# Patient Record
Sex: Male | Born: 1995 | Race: White | Hispanic: No | Marital: Single | Smoking: Never smoker
Health system: Southern US, Community
[De-identification: ages and names within clinical notes are randomized; demographics above are authoritative.]

## PROBLEM LIST (undated history)

## (undated) DIAGNOSIS — E119 Type 2 diabetes mellitus without complications: Secondary | ICD-10-CM

## (undated) DIAGNOSIS — R011 Cardiac murmur, unspecified: Secondary | ICD-10-CM

## (undated) DIAGNOSIS — T8859XA Other complications of anesthesia, initial encounter: Secondary | ICD-10-CM

## (undated) DIAGNOSIS — I10 Essential (primary) hypertension: Secondary | ICD-10-CM

## (undated) HISTORY — PX: UPPER GASTROINTESTINAL ENDOSCOPY: SHX188

## (undated) HISTORY — PX: PALATE / UVULA BIOPSY / EXCISION: SUR128

## (undated) HISTORY — DX: Type 2 diabetes mellitus without complications: E11.9

---
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
## 2017-02-11 LAB — HEMOGLOBIN A1C: HEMOGLOBIN A1C: 13

## 2017-03-01 ENCOUNTER — Ambulatory Visit (INDEPENDENT_AMBULATORY_CARE_PROVIDER_SITE_OTHER): Payer: Medicaid Other | Admitting: "Endocrinology

## 2017-03-01 ENCOUNTER — Encounter: Payer: Self-pay | Admitting: "Endocrinology

## 2017-03-01 VITALS — BP 133/93 | HR 130 | Ht 74.0 in | Wt 184.0 lb

## 2017-03-01 DIAGNOSIS — E118 Type 2 diabetes mellitus with unspecified complications: Secondary | ICD-10-CM

## 2017-03-01 DIAGNOSIS — E109 Type 1 diabetes mellitus without complications: Secondary | ICD-10-CM | POA: Insufficient documentation

## 2017-03-01 DIAGNOSIS — E1165 Type 2 diabetes mellitus with hyperglycemia: Secondary | ICD-10-CM | POA: Diagnosis not present

## 2017-03-01 DIAGNOSIS — IMO0002 Reserved for concepts with insufficient information to code with codable children: Secondary | ICD-10-CM | POA: Insufficient documentation

## 2017-03-01 DIAGNOSIS — E1143 Type 2 diabetes mellitus with diabetic autonomic (poly)neuropathy: Secondary | ICD-10-CM | POA: Insufficient documentation

## 2017-03-01 LAB — GLUCOSE, POCT (MANUAL RESULT ENTRY): POC Glucose: 423 mg/dl — AB (ref 70–99)

## 2017-03-01 MED ORDER — INSULIN PEN NEEDLE 32G X 4 MM MISC: 1.0000 | Freq: Four times a day (QID) | 3 refills | Status: DC

## 2017-03-01 MED ORDER — INSULIN GLARGINE 100 UNIT/ML SOLOSTAR PEN: 16.0000 [IU] | PEN_INJECTOR | Freq: Every day | SUBCUTANEOUS | 2 refills | Status: DC

## 2017-03-01 NOTE — Progress Notes (Signed)
Subjective:    Patient ID: [name], male    DOB: Jun 08, 1996. Patient is being seen in consultation for management of diabetes requested by  [name], FNP  Past Medical History:  Diagnosis Date  . Diabetes mellitus, type II ([place])    History reviewed. No pertinent surgical history. Social History   Social History  . Marital status: Single    Spouse name: N/A  . Number of children: N/A  . Years of education: N/A   Social History Main Topics  . Smoking status: Never Smoker  . Smokeless tobacco: Never Used  . Alcohol use No  . Drug use: No  . Sexual activity: Not Asked   Other Topics Concern  . None   Social History Narrative  . None   Outpatient Encounter Prescriptions as of 03/01/2017  Medication Sig  . Insulin Glargine (LANTUS SOLOSTAR) 100 UNIT/ML Solostar Pen Inject 16 Units into the skin daily at 10 pm.  . Insulin Pen Needle (BD PEN NEEDLE NANO U/F) 32G X 4 MM MISC 1 each by Does not apply route 4 (four) times daily.   No facility-administered encounter medications on file as of 03/01/2017.    ALLERGIES: Allergies  Allergen Reactions  . Amoxicillin    VACCINATION STATUS:  There is no immunization history on file for this patient.  Diabetes  He presents for his initial diabetic visit. He has type 2 diabetes mellitus. Onset time: He was diagnosed at approximate age of 21 years. His disease course has been worsening. There are no hypoglycemic associated symptoms. Pertinent negatives for hypoglycemia include no confusion, headaches, pallor or seizures. Associated symptoms include blurred vision, polydipsia and polyuria. Pertinent negatives for diabetes include no chest pain, no fatigue, no polyphagia and no weakness. There are no hypoglycemic complications. Symptoms are worsening. There are no diabetic complications. Risk factors for coronary artery disease include diabetes mellitus and male sex. His weight is decreasing steadily. He is following a  generally unhealthy diet. When asked about meal planning, he reported none. He has not had a previous visit with a dietitian. He rarely participates in exercise. (He came with no meter nor logs to review today. He has not taking any medications for a year. His most recent A1c was 13% on 02/11/2017. His fasting blood glucose morning was 423.) An ACE inhibitor/angiotensin II receptor blocker is not being taken. He does not see a podiatrist.Eye exam is not current.       Review of Systems  Constitutional: Positive for unexpected weight change. Negative for chills, fatigue and fever.       He lost approximately 20 pounds over the last year.  HENT: Negative for dental problem, mouth sores and trouble swallowing.   Eyes: Positive for blurred vision. Negative for visual disturbance.  Respiratory: Negative for cough, choking, chest tightness, shortness of breath and wheezing.   Cardiovascular: Negative for chest pain, palpitations and leg swelling.  Gastrointestinal: Negative for abdominal distention, abdominal pain, constipation, diarrhea, nausea and vomiting.  Endocrine: Positive for polydipsia and polyuria. Negative for polyphagia.  Genitourinary: Negative for dysuria, flank pain, hematuria and urgency.  Musculoskeletal: Negative for back pain, gait problem, myalgias and neck pain.  Skin: Negative for pallor, rash and wound.  Neurological: Negative for seizures, syncope, weakness, numbness and headaches.  Psychiatric/Behavioral: Negative.  Negative for confusion and dysphoric mood.    Objective:    BP (!) 133/93   Pulse (!) 130   Ht 6\' 2"  (9.62 m)   Wt 184  lb (83.5 kg)   BMI 23.62 kg/m   Wt Readings from Last 3 Encounters:  03/01/17 184 lb (83.5 kg) (85 %, Z= 1.05)*   * Growth percentiles are based on CDC 2-20 Years data.    Physical Exam  Constitutional: He is oriented to person, place, and time. He appears well-developed and well-nourished. He is cooperative. No distress.  HENT:   Head: Normocephalic and atraumatic.  Eyes: EOM are normal.  Neck: Normal range of motion. Neck supple. No tracheal deviation present. No thyromegaly present.  Cardiovascular: Normal rate, S1 normal, S2 normal and normal heart sounds.  Exam reveals no gallop.   No murmur heard. Pulses:      Dorsalis pedis pulses are 1+ on the right side, and 1+ on the left side.       Posterior tibial pulses are 1+ on the right side, and 1+ on the left side.  Pulmonary/Chest: Breath sounds normal. No respiratory distress. He has no wheezes.  Abdominal: Soft. Bowel sounds are normal. He exhibits no distension. There is no tenderness. There is no guarding and no CVA tenderness.  Musculoskeletal: He exhibits no edema.       Right shoulder: He exhibits no swelling and no deformity.  Neurological: He is alert and oriented to person, place, and time. He has normal strength and normal reflexes. No cranial nerve deficit or sensory deficit. Gait normal.  Skin: Skin is warm and dry. No rash noted. No cyanosis. Nails show no clubbing.  Psychiatric: His speech is normal and behavior is normal. Cognition and memory are normal.  Unconcerned affect.   Feb. 19 2018: A1c 13%. Fasting blood glucose this morning 423.   Assessment & Plan:   1. Uncontrolled type  ? diabetes mellitus with complication, unspecified long term insulin use status ([place])  - He is not very sure on the type of diabetes he has. - He has not taken any medication for more than a year, denies any history of diabetic ketoacidosis, suggesting likely type 2 diabetes.  - Patient has currently uncontrolled symptomatic type ? DM since 21  years of age ,  with most recent A1c of 13 %. Recent labs reviewed.   He is diabetes is complicated by noncompliance and patient remains at a high risk for more acute and chronic complications of diabetes which include CAD, CVA, CKD, retinopathy, and neuropathy. These are all discussed in detail with the patient.  - I have  counseled the patient on diet management and  by adopting a carbohydrate restricted/protein rich diet. - He does not have excessive weight to lose at this time, he has lost 20 pounds progressively over the last year.  - Suggestion is made for patient to avoid simple carbohydrates   from their diet including Cakes , Desserts, Ice Cream,  Soda (  diet and regular) , Sweet Tea , Candies,  Chips, Cookies, Artificial Sweeteners,   and "Sugar-free" Products . This will help patient to have stable blood glucose profile and potentially avoid unintended weight gain.  - I encouraged the patient to switch to  unprocessed or minimally processed complex starch and increased protein intake (animal or plant source), fruits, and vegetables.  - Patient is advised to stick to a routine mealtimes to eat 3 meals  a day and avoid unnecessary snacks ( to snack only to correct hypoglycemia).  - The patient will be scheduled with Jearld Fenton, RDN, CDE for individualized DM education.  - I have approached patient with the following individualized  plan to manage diabetes and patient agrees:   - Regardless of the type of diabetes he has, he will need intensive insulin treatment for now. - I approached him to engage for strict monitoring of blood glucose 4 times a day-before meals and at bedtime and return in one week with his meter and logs. - I will initiate basal insulin with Lantus 16 units daily at bedtime to start, he is likely to require nasal/bolus insulin after his commitment is assured and depending on his response to the basal insulin in one week.  -Patient is encouraged to call clinic for blood glucose levels less than 70 or above 300 mg /dl.  - He is not on any oral antidiabetic agents at this time.  - He is not a candidate for incretin, nor SGLT2 inhibitors therapy . - Patient specific target  A1c;  LDL, HDL, Triglycerides, and  Waist Circumference were discussed in detail.  2) BP/HTN: Uncontrolled.  He  is not on any medications. If blood pressure remains above target he would be considered for low-dose hydrochlorothiazide.  3) Lipids/HPL:   Lipid panel unknown.   Patient is not on statins. 4)  Weight/Diet:  He is currently appropriate weight, CDE Consult will be initiated , exercise, and detailed carbohydrates information provided.  5) Chronic Care/Health Maintenance:  -Patient is not  on ACEI/ARB and Statin medications and encouraged to continue to follow up with Ophthalmology, Podiatrist at least yearly or according to recommendations, and advised to   stay away from smoking. I have recommended yearly flu vaccine and pneumonia vaccination at least every 5 years; moderate intensity exercise for up to 150 minutes weekly; and  sleep for at least 7 hours a day.  - 60 minutes of time was spent on the care of this patient , 50% of which was applied for counseling on diabetes complications and their preventions.  - Patient to bring meter and  blood glucose logs during his next visit.   - I advised patient to maintain close follow up with PRICE, KRISTEN, FNP for primary care needs.  Follow up plan: - Return in about 1 week (around 03/08/2017) for follow up with meter and logs- no labs.  Glade Lloyd, MD Phone: [phone]  Fax: [phone]   03/01/2017, 12:47 PM

## 2017-03-14 DIAGNOSIS — M545 Low back pain, unspecified: Secondary | ICD-10-CM | POA: Insufficient documentation

## 2017-03-14 DIAGNOSIS — M542 Cervicalgia: Secondary | ICD-10-CM | POA: Insufficient documentation

## 2017-03-15 ENCOUNTER — Encounter: Payer: Self-pay | Admitting: "Endocrinology

## 2017-03-15 ENCOUNTER — Ambulatory Visit (INDEPENDENT_AMBULATORY_CARE_PROVIDER_SITE_OTHER): Payer: Medicaid Other | Admitting: "Endocrinology

## 2017-03-15 VITALS — BP 152/95 | HR 103 | Ht 74.0 in | Wt 187.0 lb

## 2017-03-15 DIAGNOSIS — E118 Type 2 diabetes mellitus with unspecified complications: Secondary | ICD-10-CM

## 2017-03-15 DIAGNOSIS — I10 Essential (primary) hypertension: Secondary | ICD-10-CM | POA: Diagnosis not present

## 2017-03-15 DIAGNOSIS — Z91199 Patient's noncompliance with other medical treatment and regimen due to unspecified reason: Secondary | ICD-10-CM | POA: Insufficient documentation

## 2017-03-15 DIAGNOSIS — E1165 Type 2 diabetes mellitus with hyperglycemia: Secondary | ICD-10-CM

## 2017-03-15 DIAGNOSIS — Z9119 Patient's noncompliance with other medical treatment and regimen: Secondary | ICD-10-CM | POA: Insufficient documentation

## 2017-03-15 LAB — GLUCOSE, POCT (MANUAL RESULT ENTRY): POC Glucose: 490 mg/dl — AB (ref 70–99)

## 2017-03-15 MED ORDER — INSULIN GLARGINE 100 UNIT/ML SOLOSTAR PEN: 30.0000 [IU] | PEN_INJECTOR | Freq: Every day | SUBCUTANEOUS | 2 refills | Status: DC

## 2017-03-15 MED ORDER — HYDROCHLOROTHIAZIDE 25 MG PO TABS: 25.0000 mg | ORAL_TABLET | Freq: Every day | ORAL | 3 refills | Status: DC

## 2017-03-15 NOTE — Progress Notes (Signed)
Subjective:    Patient ID: [name], male    DOB: [birth year]. Patient is being seen in f/u for management of diabetes requested by  [name], FNP  Past Medical History:  Diagnosis Date  . Diabetes mellitus, type II ([place])    History reviewed. No pertinent surgical history. Social History   Social History  . Marital status: Single    Spouse name: N/A  . Number of children: N/A  . Years of education: N/A   Social History Main Topics  . Smoking status: Never Smoker  . Smokeless tobacco: Never Used  . Alcohol use No  . Drug use: No  . Sexual activity: Not Asked   Other Topics Concern  . None   Social History Narrative  . None   Outpatient Encounter Prescriptions as of 03/15/2017  Medication Sig  . Insulin Glargine (LANTUS SOLOSTAR) 100 UNIT/ML Solostar Pen Inject 30 Units into the skin daily at 10 pm.  . Insulin Pen Needle (BD PEN NEEDLE NANO U/F) 32G X 4 MM MISC 1 each by Does not apply route 4 (four) times daily.  . [DISCONTINUED] Insulin Glargine (LANTUS SOLOSTAR) 100 UNIT/ML Solostar Pen Inject 16 Units into the skin daily at 10 pm.   No facility-administered encounter medications on file as of 03/15/2017.    ALLERGIES: Allergies  Allergen Reactions  . Amoxicillin    VACCINATION STATUS:  There is no immunization history on file for this patient.  Diabetes  He presents for his follow-up diabetic visit. He has type 2 diabetes mellitus. Onset time: He was diagnosed at approximate age of 21 years. His disease course has been worsening. There are no hypoglycemic associated symptoms. Pertinent negatives for hypoglycemia include no confusion, headaches, pallor or seizures. Associated symptoms include blurred vision, polydipsia and polyuria. Pertinent negatives for diabetes include no chest pain, no fatigue, no polyphagia and no weakness. There are no hypoglycemic complications. Symptoms are worsening. There are no diabetic complications. Risk factors for  coronary artery disease include diabetes mellitus and male sex. His weight is decreasing steadily. He is following a generally unhealthy diet. When asked about meal planning, he reported none. He has not had a previous visit with a dietitian. He rarely participates in exercise. His overall blood glucose range is >200 mg/dl. (He came with no meter , documentedglucose readings between 400-428 , 4 x  A day . Most of these readingsa re suspicious . His most recent A1c was 13% on 02/11/2017. Marland Kitchen) An ACE inhibitor/angiotensin II receptor blocker is not being taken. He does not see a podiatrist.Eye exam is not current.    Review of Systems  Constitutional: Positive for unexpected weight change. Negative for chills, fatigue and fever.       He lost approximately 20 pounds over the last year.  HENT: Negative for dental problem, mouth sores and trouble swallowing.   Eyes: Positive for blurred vision. Negative for visual disturbance.  Respiratory: Negative for cough, choking, chest tightness, shortness of breath and wheezing.   Cardiovascular: Negative for chest pain, palpitations and leg swelling.  Gastrointestinal: Negative for abdominal distention, abdominal pain, constipation, diarrhea, nausea and vomiting.  Endocrine: Positive for polydipsia and polyuria. Negative for polyphagia.  Genitourinary: Negative for dysuria, flank pain, hematuria and urgency.  Musculoskeletal: Negative for back pain, gait problem, myalgias and neck pain.  Skin: Negative for pallor, rash and wound.  Neurological: Negative for seizures, syncope, weakness, numbness and headaches.  Psychiatric/Behavioral: Negative.  Negative for confusion and dysphoric mood.  Objective:    BP (!) 152/95   Pulse (!) 103   Ht 6\' 2"  (1.88 m)   Wt 187 lb (84.8 kg)   BMI 24.01 kg/m   Wt Readings from Last 3 Encounters:  03/15/17 187 lb (84.8 kg) (87 %, Z= 1.13)*  03/01/17 184 lb (83.5 kg) (85 %, Z= 1.05)*   * Growth percentiles are based on  CDC 2-20 Years data.    Physical Exam  Constitutional: He is oriented to person, place, and time. He appears well-developed and well-nourished. He is cooperative. No distress.  HENT:  Head: Normocephalic and atraumatic.  Eyes: EOM are normal.  Neck: Normal range of motion. Neck supple. No tracheal deviation present. No thyromegaly present.  Cardiovascular: Normal rate, S1 normal, S2 normal and normal heart sounds.  Exam reveals no gallop.   No murmur heard. Pulses:      Dorsalis pedis pulses are 1+ on the right side, and 1+ on the left side.       Posterior tibial pulses are 1+ on the right side, and 1+ on the left side.  Pulmonary/Chest: Breath sounds normal. No respiratory distress. He has no wheezes.  Abdominal: Soft. Bowel sounds are normal. He exhibits no distension. There is no tenderness. There is no guarding and no CVA tenderness.  Musculoskeletal: He exhibits no edema.       Right shoulder: He exhibits no swelling and no deformity.  Neurological: He is alert and oriented to person, place, and time. He has normal strength and normal reflexes. No cranial nerve deficit or sensory deficit. Gait normal.  Skin: Skin is warm and dry. No rash noted. No cyanosis. Nails show no clubbing.  Psychiatric: His speech is normal and behavior is normal. Cognition and memory are normal.  Unconcerned affect.   Feb. 19 2018: A1c 13%. Fasting blood glucose this morning 423.   Assessment & Plan:   1. Uncontrolled type  ? diabetes mellitus with complication, unspecified long term insulin use status ([place])  - He is not very sure on the type of diabetes he has. - Prior to his last visit, he has not taken any medication for more than a year, denies any history of diabetic ketoacidosis, suggesting likely type 2 diabetes.  - Patient has currently uncontrolled symptomatic type ? DM since 21  years of age ,  with most recent A1c of 13 %. Recent labs reviewed.   He is diabetes is complicated by  noncompliance and patient remains at a high risk for more acute and chronic complications of diabetes which include CAD, CVA, CKD, retinopathy, and neuropathy. These are all discussed in detail with the patient.  - I have counseled the patient on diet management and  by adopting a carbohydrate restricted/protein rich diet. - He does not have excessive weight to lose at this time, he has lost 20 pounds progressively over the last year.  - Suggestion is made for patient to avoid simple carbohydrates   from their diet including Cakes , Desserts, Ice Cream,  Soda (  diet and regular) , Sweet Tea , Candies,  Chips, Cookies, Artificial Sweeteners,   and "Sugar-free" Products . This will help patient to have stable blood glucose profile and potentially avoid unintended weight gain.  - I encouraged the patient to switch to  unprocessed or minimally processed complex starch and increased protein intake (animal or plant source), fruits, and vegetables.  - Patient is advised to stick to a routine mealtimes to eat 3 meals  a  day and avoid unnecessary snacks ( to snack only to correct hypoglycemia).  - The patient will be scheduled with Jearld Fenton, RDN, CDE for individualized DM education.  - I have approached patient with the following individualized plan to manage diabetes and patient agrees:   - Fortunately patient presents with suspicious blood glucose readings without his meter. - Regardless of the type of diabetes he has, he will need intensive insulin treatment for now, however his engagement for strict monitoring for optimal use of insulin is necessary. - I approached him to engage better , start monitoring of blood glucose 4 times a day-before meals and at bedtime and return in 2 weeks with his meter and logs. - I will increase his basal insulin with Lantus to 30 units daily at bedtime to start, he is likely to require basal/bolus insulin after his commitment is assured and depending on his response  to the basal insulin in one week.  -Patient is encouraged to call clinic for blood glucose levels less than 70 or above 300 mg /dl.  - He is not on any oral antidiabetic agents at this time.  - He is not a candidate for incretin, nor SGLT2 inhibitors therapy . - Patient specific target  A1c;  LDL, HDL, Triglycerides, and  Waist Circumference were discussed in detail.  2) BP/HTN: Uncontrolled on 2 visits now.  He is not on any medications. I will prescribed hydrochlorothiazide 25 mg by mouth daily for him. 3) Lipids/HPL:   Lipid panel unknown.   Patient is not on statins. 4)  Weight/Diet:  He has currently appropriate weight, CDE Consult will be initiated , exercise, and detailed carbohydrates information provided.  5) Chronic Care/Health Maintenance:  -Patient is not  on ACEI/ARB and Statin medications and encouraged to continue to follow up with Ophthalmology, Podiatrist at least yearly or according to recommendations, and advised to   stay away from smoking. I have recommended yearly flu vaccine and pneumonia vaccination at least every 5 years; moderate intensity exercise for up to 150 minutes weekly; and  sleep for at least 7 hours a day.  - 30 minutes of time was spent on the care of this patient , 50% of which was applied for counseling on diabetes complications and their preventions.  - Patient to bring meter and  blood glucose logs during his next visit.   - I advised patient to maintain close follow up with PRICE, KRISTEN, FNP for primary care needs.  Follow up plan: - Return in about 2 weeks (around 03/29/2017) for follow up with pre-visit labs, meter, and logs.  Glade Lloyd, MD Phone: [phone]  Fax: [phone]   03/15/2017, 8:48 AM

## 2017-03-27 ENCOUNTER — Encounter: Payer: Medicaid Other | Attending: Emergency Medicine | Admitting: Nutrition

## 2017-03-27 ENCOUNTER — Other Ambulatory Visit: Payer: Self-pay | Admitting: "Endocrinology

## 2017-03-27 VITALS — Ht 74.0 in | Wt 198.0 lb

## 2017-03-27 DIAGNOSIS — E118 Type 2 diabetes mellitus with unspecified complications: Secondary | ICD-10-CM | POA: Diagnosis not present

## 2017-03-27 DIAGNOSIS — IMO0002 Reserved for concepts with insufficient information to code with codable children: Secondary | ICD-10-CM

## 2017-03-27 DIAGNOSIS — Z713 Dietary counseling and surveillance: Secondary | ICD-10-CM | POA: Diagnosis not present

## 2017-03-27 DIAGNOSIS — E108 Type 1 diabetes mellitus with unspecified complications: Secondary | ICD-10-CM

## 2017-03-27 DIAGNOSIS — Z68.41 Body mass index (BMI) pediatric, 5th percentile to less than 85th percentile for age: Secondary | ICD-10-CM | POA: Diagnosis not present

## 2017-03-27 DIAGNOSIS — E1065 Type 1 diabetes mellitus with hyperglycemia: Secondary | ICD-10-CM

## 2017-03-27 LAB — COMPREHENSIVE METABOLIC PANEL
ALBUMIN: 4.4 g/dL (ref 3.6–5.1)
ALK PHOS: 58 U/L (ref 48–230)
ALT: 12 U/L (ref 8–46)
AST: 14 U/L (ref 12–32)
BILIRUBIN TOTAL: 0.5 mg/dL (ref 0.2–1.1)
BUN: 7 mg/dL (ref 7–20)
CALCIUM: 10 mg/dL (ref 8.9–10.4)
CO2: 22 mmol/L (ref 20–31)
CREATININE: 0.66 mg/dL (ref 0.60–1.26)
Chloride: 102 mmol/L (ref 98–110)
GLUCOSE: 227 mg/dL — AB (ref 65–99)
Potassium: 5.2 mmol/L — ABNORMAL HIGH (ref 3.8–5.1)
Sodium: 138 mmol/L (ref 135–146)
Total Protein: 7.4 g/dL (ref 6.3–8.2)

## 2017-03-27 NOTE — Patient Instructions (Signed)
Goals 1. Follow My Plate Method 2. Eat 4-5 carb choices per meal Increase fresh fruit and vegetabls. 3. Keep drinking water- 1 gallon 4. Keep exercising 30 minutes per day 5. Take 30 units of Lantus at night and give in abdomen area 6. Goal is to get A1C down to 7-8%. Make sure you bing meter and BS logs with to all appointments

## 2017-03-27 NOTE — Progress Notes (Signed)
Diabetes Self-Management Education  Visit Type: First/Initial  Appt. Start Time: 1100 Appt. End Time: 1200  03/27/2017  Mr. Alycia Rossetti, identified by name and date of birth, is a 21 y.o. male with a diagnosis of Diabetes: Type 1. Lives with his Mom and Dad who are separated. Eats three meals per day. Taking Lantus daily as prescribed. Eating meals consistently. Motivated to make healthy choices and improve DM and comply with taking insulin. Leaving the office now to go get his blood work done today. Sees Dr. Dorris Fetch, Endocrinologist next week.  ASSESSMENT  Height 6\' 2"  (1.88 m), weight 198 lb (89.8 kg). Body mass index is 25.42 kg/m.      Diabetes Self-Management Education - 03/27/17 1107      Visit Information   Visit Type First/Initial     Initial Visit   Diabetes Type Type 1   Are you currently following a meal plan? No   Are you taking your medications as prescribed? Yes   Date Diagnosed 2013     Health Coping   How would you rate your overall health? Good     Psychosocial Assessment   Patient Belief/Attitude about Diabetes Motivated to manage diabetes   Self-care barriers None   Self-management support Family;Doctor's office   Other persons present Patient   Patient Concerns Nutrition/Meal planning;Medication;Healthy Lifestyle;Problem Solving;Weight Control   Preferred Learning Style No preference indicated   Learning Readiness Contemplating   How often do you need to have someone help you when you read instructions, pamphlets, or other written materials from your doctor or pharmacy? 1 - Never   What is the last grade level you completed in school? 12     Pre-Education Assessment   Patient understands the diabetes disease and treatment process. Needs Review   Patient understands incorporating nutritional management into lifestyle. Needs Review   Patient undertands incorporating physical activity into lifestyle. Needs Review   Patient understands using medications  safely. Needs Review   Patient understands monitoring blood glucose, interpreting and using results Needs Review   Patient understands prevention, detection, and treatment of acute complications. Needs Review   Patient understands prevention, detection, and treatment of chronic complications. Needs Review   Patient understands how to develop strategies to address psychosocial issues. Needs Review   Patient understands how to develop strategies to promote health/change behavior. Needs Review     Complications   Last HgB A1C per patient/outside source 13 %   How often do you check your blood sugar? 3-4 times/day   Fasting Blood glucose range (mg/dL) >200   Postprandial Blood glucose range (mg/dL) >200   Number of hypoglycemic episodes per month 0   Number of hyperglycemic episodes per week 0   Have you had a dilated eye exam in the past 12 months? No   Have you had a dental exam in the past 12 months? No   Are you checking your feet? No     Dietary Intake   Breakfast skipped due to blood work; eggs,  1 biscuits, 1 sausage, 1% milk 8 oz   Lunch 2 bologna and cheese sandwiches,    Snack (afternoon) water or crystal light   Dinner Grilled salmon, green beans, and water   Beverage(s) water, crystal light     Exercise   How many days per week to you exercise? 5   How many minutes per day do you exercise? 30   Total minutes per week of exercise 150     Patient Education  Previous Diabetes Education Yes (please comment)  [place]   Disease state  Explored patient's options for treatment of their diabetes   Nutrition management  Meal options for control of blood glucose level and chronic complications.;Meal timing in regards to the patients' current diabetes medication.;Carbohydrate counting;Food label reading, portion sizes and measuring food.;Role of diet in the treatment of diabetes and the relationship between the three main macronutrients and blood glucose level   Physical activity and  exercise  Role of exercise on diabetes management, blood pressure control and cardiac health.;Helped patient identify appropriate exercises in relation to his/her diabetes, diabetes complications and other health issue.   Medications Taught/reviewed insulin injection, site rotation, insulin storage and needle disposal.;Reviewed patients medication for diabetes, action, purpose, timing of dose and side effects.;Reviewed medication adjustment guidelines for hyperglycemia and sick days.   Monitoring Purpose and frequency of SMBG.;Taught/discussed recording of test results and interpretation of SMBG.;Interpreting lab values - A1C, lipid, urine microalbumina.;Identified appropriate SMBG and/or A1C goals.;Daily foot exams;Taught/evaluated SMBG meter.;Yearly dilated eye exam   Acute complications Discussed and identified patients' treatment of hyperglycemia.   Chronic complications Relationship between chronic complications and blood glucose control;Lipid levels, blood glucose control and heart disease;Identified and discussed with patient  current chronic complications;Retinopathy and reason for yearly dilated eye exams;Nephropathy, what it is, prevention of, the use of ACE, ARB's and early detection of through urine microalbumia.;Reviewed with patient heart disease, higher risk of, and prevention   Psychosocial adjustment Helped patient identify a support system for diabetes management;Identified and addressed patients feelings and concerns about diabetes;Brainstormed with patient on coping mechanisms for social situations, getting support from significant others, dealing with feelings about diabetes;Worked with patient to identify barriers to care and solutions   Personal strategies to promote health Review risk of smoking and offered smoking cessation     Individualized Goals (developed by patient)   Nutrition General guidelines for healthy choices and portions discussed;Adjust meds/carbs with exercise as  discussed;Follow meal plan discussed   Physical Activity Exercise 5-7 days per week;30 minutes per day   Medications take my medication as prescribed   Monitoring  send in my blood glucose log as discussed;test blood glucose pre and post meals as discussed   Reducing Risk examine blood glucose patterns;get labs drawn;do foot checks daily;treat hypoglycemia with 15 grams of carbs if blood glucose less than 70mg /dL;increase portions of nuts and seeds;increase portions of healthy fats   Health Coping ask for help with (comment)  meal planning     Post-Education Assessment   Patient understands the diabetes disease and treatment process. Demonstrates understanding / competency   Patient understands incorporating nutritional management into lifestyle. Needs Review   Patient undertands incorporating physical activity into lifestyle. Needs Review     Outcomes   Expected Outcomes Demonstrated interest in learning. Expect positive outcomes   Future DMSE 2 wks   Program Status Completed      Individualized Plan for Diabetes Self-Management Training:   Learning Objective:  Patient will have a greater understanding of diabetes self-management. Patient education plan is to attend individual and/or group sessions per assessed needs and concerns.   Plan:   Patient Instructions  Goals 1. Follow My Plate Method 2. Eat 4-5 carb choices per meal Increase fresh fruit and vegetabls. 3. Keep drinking water- 1 gallon 4. Keep exercising 30 minutes per day 5. Take 30 units of Lantus at night and give in abdomen area 6. Goal is to get A1C down to 7-8%. Make sure you bing meter  and BS logs with to all appointments    Expected Outcomes:  Demonstrated interest in learning. Expect positive outcomes  Education material provided: Living Well with Diabetes, Food label handouts, Meal plan card, My Plate and Carbohydrate counting sheet  If problems or questions, patient to contact team via:  Phone and  Email  Future DSME appointment: 2 wks

## 2017-03-29 ENCOUNTER — Ambulatory Visit: Payer: Medicaid Other | Admitting: "Endocrinology

## 2017-04-03 ENCOUNTER — Encounter: Payer: Self-pay | Admitting: "Endocrinology

## 2017-04-03 ENCOUNTER — Other Ambulatory Visit: Payer: Self-pay

## 2017-04-03 ENCOUNTER — Encounter: Payer: Medicaid Other | Admitting: Nutrition

## 2017-04-03 ENCOUNTER — Ambulatory Visit (INDEPENDENT_AMBULATORY_CARE_PROVIDER_SITE_OTHER): Payer: Medicaid Other | Admitting: "Endocrinology

## 2017-04-03 VITALS — BP 135/89 | HR 101 | Ht 74.0 in | Wt 196.0 lb

## 2017-04-03 VITALS — Ht 72.0 in | Wt 196.0 lb

## 2017-04-03 DIAGNOSIS — E1165 Type 2 diabetes mellitus with hyperglycemia: Secondary | ICD-10-CM

## 2017-04-03 DIAGNOSIS — Z794 Long term (current) use of insulin: Principal | ICD-10-CM

## 2017-04-03 DIAGNOSIS — E118 Type 2 diabetes mellitus with unspecified complications: Principal | ICD-10-CM

## 2017-04-03 DIAGNOSIS — I10 Essential (primary) hypertension: Secondary | ICD-10-CM | POA: Diagnosis not present

## 2017-04-03 DIAGNOSIS — Z9119 Patient's noncompliance with other medical treatment and regimen: Secondary | ICD-10-CM | POA: Diagnosis not present

## 2017-04-03 DIAGNOSIS — Z91199 Patient's noncompliance with other medical treatment and regimen due to unspecified reason: Secondary | ICD-10-CM

## 2017-04-03 DIAGNOSIS — IMO0002 Reserved for concepts with insufficient information to code with codable children: Secondary | ICD-10-CM

## 2017-04-03 MED ORDER — INSULIN GLARGINE 100 UNIT/ML SOLOSTAR PEN: 40.0000 [IU] | PEN_INJECTOR | Freq: Every day | SUBCUTANEOUS | 2 refills | Status: DC

## 2017-04-03 MED ORDER — HYDROCHLOROTHIAZIDE 25 MG PO TABS: 25.0000 mg | ORAL_TABLET | Freq: Every day | ORAL | 0 refills | Status: DC

## 2017-04-03 MED ORDER — GLUCOSE BLOOD VI STRP: ORAL_STRIP | 5 refills | Status: DC

## 2017-04-03 MED ORDER — ACCU-CHEK SOFTCLIX LANCET DEV MISC: 5 refills | Status: DC

## 2017-04-03 NOTE — Progress Notes (Signed)
Subjective:    Patient ID: [name], male    DOB: 22-Oct-1996. Patient is being seen in f/u for management of diabetes requested by  [name], FNP  Past Medical History:  Diagnosis Date  . Diabetes mellitus, type II ([place])    No past surgical history on file. Social History   Social History  . Marital status: Single    Spouse name: N/A  . Number of children: N/A  . Years of education: N/A   Social History Main Topics  . Smoking status: Never Smoker  . Smokeless tobacco: Never Used  . Alcohol use No  . Drug use: No  . Sexual activity: Not Asked   Other Topics Concern  . None   Social History Narrative  . None   Outpatient Encounter Prescriptions as of 04/03/2017  Medication Sig  . hydrochlorothiazide (HYDRODIURIL) 25 MG tablet Take 1 tablet (25 mg total) by mouth daily.  . Insulin Glargine (LANTUS SOLOSTAR) 100 UNIT/ML Solostar Pen Inject 40 Units into the skin daily at 10 pm.  . Insulin Pen Needle (BD PEN NEEDLE NANO U/F) 32G X 4 MM MISC 1 each by Does not apply route 4 (four) times daily.  . [DISCONTINUED] Insulin Glargine (LANTUS SOLOSTAR) 100 UNIT/ML Solostar Pen Inject 30 Units into the skin daily at 10 pm.   No facility-administered encounter medications on file as of 04/03/2017.    ALLERGIES: Allergies  Allergen Reactions  . Amoxicillin    VACCINATION STATUS:  There is no immunization history on file for this patient.  Diabetes  He presents for his follow-up diabetic visit. He has type 2 diabetes mellitus. Onset time: He was diagnosed at approximate age of 21 years. His disease course has been improving. There are no hypoglycemic associated symptoms. Pertinent negatives for hypoglycemia include no confusion, headaches, pallor or seizures. Associated symptoms include blurred vision, polydipsia and polyuria. Pertinent negatives for diabetes include no chest pain, no fatigue, no polyphagia and no weakness. There are no hypoglycemic complications.  Symptoms are improving. There are no diabetic complications. Risk factors for coronary artery disease include diabetes mellitus and male sex. His weight is increasing steadily. He is following a generally unhealthy diet. When asked about meal planning, he reported none. He has not had a previous visit with a dietitian. He rarely participates in exercise. His overall blood glucose range is >200 mg/dl. (She came with inadequate monitoring but better than before, monitored 8 times in the last 7 days averaging 250 which is improving from averaging nearly 500 during the last 2 visits.) An ACE inhibitor/angiotensin II receptor blocker is not being taken. He does not see a podiatrist.Eye exam is not current.    Review of Systems  Constitutional: Positive for unexpected weight change. Negative for chills, fatigue and fever.        He has regained 12 pounds over the last 2 weeks after he lost approximately 20 pounds over the last year.  HENT: Negative for dental problem, mouth sores and trouble swallowing.   Eyes: Positive for blurred vision. Negative for visual disturbance.  Respiratory: Negative for cough, choking, chest tightness, shortness of breath and wheezing.   Cardiovascular: Negative for chest pain, palpitations and leg swelling.  Gastrointestinal: Negative for abdominal distention, abdominal pain, constipation, diarrhea, nausea and vomiting.  Endocrine: Positive for polydipsia and polyuria. Negative for polyphagia.  Genitourinary: Negative for dysuria, flank pain, hematuria and urgency.  Musculoskeletal: Negative for back pain, gait problem, myalgias and neck pain.  Skin: Negative  for pallor, rash and wound.  Neurological: Negative for seizures, syncope, weakness, numbness and headaches.  Psychiatric/Behavioral: Negative.  Negative for confusion and dysphoric mood.    Objective:    BP 135/89   Pulse (!) 101   Ht 6\' 2"  (1.88 m)   Wt 196 lb (88.9 kg)   BMI 25.16 kg/m   Wt Readings from  Last 3 Encounters:  04/03/17 196 lb (88.9 kg) (91 %, Z= 1.36)*  03/27/17 198 lb (89.8 kg) (92 %, Z= 1.41)*  03/15/17 187 lb (84.8 kg) (87 %, Z= 1.13)*   * Growth percentiles are based on CDC 2-20 Years data.    Physical Exam  Constitutional: He is oriented to person, place, and time. He appears well-developed and well-nourished. He is cooperative. No distress.  HENT:  Head: Normocephalic and atraumatic.  Eyes: EOM are normal.  Neck: Normal range of motion. Neck supple. No tracheal deviation present. No thyromegaly present.  Cardiovascular: Normal rate, S1 normal, S2 normal and normal heart sounds.  Exam reveals no gallop.   No murmur heard. Pulses:      Dorsalis pedis pulses are 1+ on the right side, and 1+ on the left side.       Posterior tibial pulses are 1+ on the right side, and 1+ on the left side.  Pulmonary/Chest: Breath sounds normal. No respiratory distress. He has no wheezes.  Abdominal: Soft. Bowel sounds are normal. He exhibits no distension. There is no tenderness. There is no guarding and no CVA tenderness.  Musculoskeletal: He exhibits no edema.       Right shoulder: He exhibits no swelling and no deformity.  Neurological: He is alert and oriented to person, place, and time. He has normal strength and normal reflexes. No cranial nerve deficit or sensory deficit. Gait normal.  Skin: Skin is warm and dry. No rash noted. No cyanosis. Nails show no clubbing.  Psychiatric: His speech is normal and behavior is normal. Cognition and memory are normal.  Unconcerned affect.   Feb. 19 2018: A1c 13%. Fasting blood glucose this morning 423.   Assessment & Plan:   1. Uncontrolled type  ? diabetes mellitus with complication, unspecified long term insulin use status ([place])  - He is not very sure on the type of diabetes he has. - Prior to his last visit, he has not taken any medication for more than a year, denies any history of diabetic ketoacidosis, suggesting likely type 2  diabetes.  - Patient has currently uncontrolled symptomatic type ? DM since 21  years of age ,  with most recent A1c of 13 %. Recent labs reviewed.   He is diabetes is complicated by noncompliance and patient remains at a high risk for more acute and chronic complications of diabetes which include CAD, CVA, CKD, retinopathy, and neuropathy. These are all discussed in detail with the patient.  - I have counseled the patient on diet management and  by adopting a carbohydrate restricted/protein rich diet. - He does not have excessive weight to lose at this time, he has lost 20 pounds progressively over the last year.  - Suggestion is made for patient to avoid simple carbohydrates   from their diet including Cakes , Desserts, Ice Cream,  Soda (  diet and regular) , Sweet Tea , Candies,  Chips, Cookies, Artificial Sweeteners,   and "Sugar-free" Products . This will help patient to have stable blood glucose profile and potentially avoid unintended weight gain.  - I encouraged the patient to  switch to  unprocessed or minimally processed complex starch and increased protein intake (animal or plant source), fruits, and vegetables.  - Patient is advised to stick to a routine mealtimes to eat 3 meals  a day and avoid unnecessary snacks ( to snack only to correct hypoglycemia).  - The patient will be scheduled with Jearld Fenton, RDN, CDE for individualized DM education.  - I have approached patient with the following individualized plan to manage diabetes and patient agrees:   - Unfortunately, patient remains alarmingly noncompliant monitoring at random and inadequately presents with a meter showing 8 readings in the last 7 days, averaging 250 mg/dL which is a significant improvement from prior visits.  - Regardless of the type of diabetes he has, he will need intensive insulin treatment for now, however his engagement for strict monitoring for optimal use of insulin is necessary. - I re-approached him to  engage better , start monitoring of blood glucose 4 times a day-before meals and at bedtime and return in 2 weeks with his meter and logs. - I will increase his basal insulin with Lantus to 40 units daily at bedtime to start ( there is evidence that he is already benefiting from his basal insulin), he is likely to require basal/bolus insulin after his commitment is assured and depending on his response to the basal insulin in one week.  -Patient is encouraged to call clinic for blood glucose levels less than 70 or above 300 mg /dl.  - He is not on any oral antidiabetic agents at this time.  - He is not a candidate for incretin, nor SGLT2 inhibitors therapy . - Patient specific target  A1c;  LDL, HDL, Triglycerides, and  Waist Circumference were discussed in detail.  2) BP/HTN: Uncontrolled on 2 visits now.  He is not on any medications. I will prescribed hydrochlorothiazide 25 mg by mouth daily for him. 3) Lipids/HPL:   Lipid panel unknown.   Patient is not on statins. 4)  Weight/Diet:  He has currently appropriate weight, CDE Consult will be initiated , exercise, and detailed carbohydrates information provided.  5) Chronic Care/Health Maintenance:  -Patient is not  on ACEI/ARB and Statin medications and encouraged to continue to follow up with Ophthalmology, Podiatrist at least yearly or according to recommendations, and advised to   stay away from smoking. I have recommended yearly flu vaccine and pneumonia vaccination at least every 5 years; moderate intensity exercise for up to 150 minutes weekly; and  sleep for at least 7 hours a day.  - 30 minutes of time was spent on the care of this patient , 50% of which was applied for counseling on diabetes complications and their preventions.  - Patient to bring meter and  blood glucose logs during his next visit.   - I advised patient to maintain close follow up with PRICE, KRISTEN, FNP for primary care needs.  Follow up plan: - Return in about  2 weeks (around 04/17/2017) for follow up with meter and logs- no labs.  Glade Lloyd, MD Phone: [phone]  Fax: [phone]   04/03/2017, 11:22 AM

## 2017-04-03 NOTE — Patient Instructions (Addendum)
Goals 1. Drink 5 bottles of water per day 2. Don't skips meals 3. Eat meal on times. 4. Take 40 units of Lantus daily. Give insulin in stomach areas for best absorption Walk 30 minutes daily.

## 2017-04-03 NOTE — Progress Notes (Signed)
Diabetes Self-Management Education  Visit Type: Follow-up  Appt. Start Time: 1130  Appt. End Time: 6948  04/03/2017  Mr. [name], identified by name and date of birth, is a 21 y.o. male with a diagnosis of Diabetes:  . He came in today with a few blood sugar readings but not 4 times per day. He has been helping his sister move and wasn't able to eat meals on time as he was told to do. He is drinking more water. He is not eating snacks between meals. Says he didn't miss any doses of insulin. Injects in abdomen or arm. He is better engaged in better compliance with diet and medications. Making slow progress. Goes between his mom and dads houses and has 2 different meters that he records reading on.  Diet needs more low carb vegetables and increased water intake. ASSESSMENT  Height 6' (1.829 m), weight 196 lb (88.9 kg). Body mass index is 26.58 kg/m.      Diabetes Self-Management Education - 04/03/17 1139      Visit Information   Visit Type Follow-up     Pre-Education Assessment   Patient understands the diabetes disease and treatment process. Needs Review   Patient understands incorporating nutritional management into lifestyle. Needs Review   Patient undertands incorporating physical activity into lifestyle. Needs Review   Patient understands using medications safely. Needs Review   Patient understands monitoring blood glucose, interpreting and using results Needs Review   Patient understands prevention, detection, and treatment of acute complications. Needs Review   Patient understands prevention, detection, and treatment of chronic complications. Demonstrates understanding / competency   Patient understands how to develop strategies to address psychosocial issues. Demonstrates understanding / competency   Patient understands how to develop strategies to promote health/change behavior. Demonstrates understanding / competency     Complications   How often do you check your  blood sugar? 1-2 times/day   Fasting Blood glucose range (mg/dL) 180-200;>200   Postprandial Blood glucose range (mg/dL) >200   Number of hypoglycemic episodes per month 0   Number of hyperglycemic episodes per week 15     Dietary Intake   Breakfast 2 eggs, biscuit and sausage, water, milk   Lunch 2 bologna and cheese sandwiches, apple, water   Dinner broccoli and chicken casserole with rice, apple, water   Beverage(s) water     Exercise   Exercise Type Light (walking / raking leaves)   How many days per week to you exercise? 30   How many minutes per day do you exercise? 5   Total minutes per week of exercise 150     Patient Education   Nutrition management  Carbohydrate counting;Meal options for control of blood glucose level and chronic complications.;Meal timing in regards to the patients' current diabetes medication.   Physical activity and exercise  Role of exercise on diabetes management, blood pressure control and cardiac health.   Monitoring Taught/evaluated SMBG meter.;Purpose and frequency of SMBG.;Identified appropriate SMBG and/or A1C goals.   Acute complications Discussed and identified patients' treatment of hyperglycemia.     Individualized Goals (developed by patient)   Nutrition Follow meal plan discussed;General guidelines for healthy choices and portions discussed;Adjust meds/carbs with exercise as discussed   Physical Activity Exercise 5-7 days per week;45 minutes per day   Monitoring  test my blood glucose as discussed;test blood glucose pre and post meals as discussed   Reducing Risk examine blood glucose patterns;do foot checks daily     Patient Self-Evaluation of Goals -  Patient rates self as meeting previously set goals (% of time)   Nutrition 50 - 75 %   Physical Activity 50 - 75 %   Medications >75%   Monitoring < 25%   Problem Solving < 25%   Reducing Risk 25 - 50%   Health Coping 25 - 50%     Post-Education Assessment   Patient understands the  diabetes disease and treatment process. Needs Review   Patient understands incorporating nutritional management into lifestyle. Needs Review   Patient undertands incorporating physical activity into lifestyle. Needs Review   Patient understands using medications safely. Needs Review   Patient understands monitoring blood glucose, interpreting and using results Needs Review   Patient understands prevention, detection, and treatment of acute complications. Needs Review     Outcomes   Expected Outcomes Demonstrated interest in learning. Expect positive outcomes   Future DMSE 4-6 wks   Program Status Completed      Individualized Plan for Diabetes Self-Management Training:   Learning Objective:  Patient will have a greater understanding of diabetes self-management. Patient education plan is to attend individual and/or group sessions per assessed needs and concerns.   Plan:   Patient Instructions  Goals 1. Drink 5 bottles of water per day 2. Don't skips meals 3. Eat meal on times. 4. Take 40 units daily. Give insulin in stomach areas for best absorption Walk 30 minutes daily.    Expected Outcomes:  Demonstrated interest in learning. Expect positive outcomes  Education material provided: My Plate and Carbohydrate counting sheet  If problems or questions, patient to contact team via:  Phone and Email  Future DSME appointment: 4-6 wks

## 2017-04-17 ENCOUNTER — Ambulatory Visit: Payer: Medicaid Other | Admitting: "Endocrinology

## 2017-05-07 ENCOUNTER — Encounter: Payer: Self-pay | Admitting: "Endocrinology

## 2017-05-07 ENCOUNTER — Ambulatory Visit: Payer: Medicaid Other | Admitting: "Endocrinology

## 2018-08-10 ENCOUNTER — Encounter (HOSPITAL_COMMUNITY): Payer: Self-pay | Admitting: Emergency Medicine

## 2018-08-10 ENCOUNTER — Emergency Department (HOSPITAL_COMMUNITY): Payer: Medicaid Other

## 2018-08-10 ENCOUNTER — Other Ambulatory Visit: Payer: Self-pay

## 2018-08-10 ENCOUNTER — Emergency Department (HOSPITAL_COMMUNITY)
Admission: EM | Admit: 2018-08-10 | Discharge: 2018-08-10 | Disposition: A | Discharge status: Home / Self Care | Payer: Medicaid Other | Attending: Emergency Medicine | Admitting: Emergency Medicine

## 2018-08-10 DIAGNOSIS — R002 Palpitations: Secondary | ICD-10-CM | POA: Diagnosis present

## 2018-08-10 DIAGNOSIS — E119 Type 2 diabetes mellitus without complications: Secondary | ICD-10-CM | POA: Diagnosis not present

## 2018-08-10 DIAGNOSIS — I10 Essential (primary) hypertension: Secondary | ICD-10-CM | POA: Insufficient documentation

## 2018-08-10 DIAGNOSIS — E860 Dehydration: Secondary | ICD-10-CM | POA: Diagnosis not present

## 2018-08-10 DIAGNOSIS — Z794 Long term (current) use of insulin: Secondary | ICD-10-CM | POA: Insufficient documentation

## 2018-08-10 DIAGNOSIS — E1165 Type 2 diabetes mellitus with hyperglycemia: Secondary | ICD-10-CM

## 2018-08-10 HISTORY — DX: Essential (primary) hypertension: I10

## 2018-08-10 LAB — CBC WITH DIFFERENTIAL/PLATELET
Basophils Absolute: 0 10*3/uL (ref 0.0–0.1)
Basophils Relative: 0 %
Eosinophils Absolute: 0.1 10*3/uL (ref 0.0–0.7)
Eosinophils Relative: 0 %
HCT: 43.3 % (ref 39.0–52.0)
HEMOGLOBIN: 15.2 g/dL (ref 13.0–17.0)
LYMPHS ABS: 2.6 10*3/uL (ref 0.7–4.0)
LYMPHS PCT: 13 %
MCH: 29.6 pg (ref 26.0–34.0)
MCHC: 35.1 g/dL (ref 30.0–36.0)
MCV: 84.2 fL (ref 78.0–100.0)
Monocytes Absolute: 1.6 10*3/uL — ABNORMAL HIGH (ref 0.1–1.0)
Monocytes Relative: 8 %
NEUTROS PCT: 79 %
Neutro Abs: 15.6 10*3/uL — ABNORMAL HIGH (ref 1.7–7.7)
Platelets: 268 10*3/uL (ref 150–400)
RBC: 5.14 MIL/uL (ref 4.22–5.81)
RDW: 12.5 % (ref 11.5–15.5)
WBC: 19.9 10*3/uL — AB (ref 4.0–10.5)

## 2018-08-10 LAB — URINALYSIS, ROUTINE W REFLEX MICROSCOPIC
BACTERIA UA: NONE SEEN
Bilirubin Urine: NEGATIVE
Hgb urine dipstick: NEGATIVE
Ketones, ur: 5 mg/dL — AB
LEUKOCYTES UA: NEGATIVE
Nitrite: NEGATIVE
PROTEIN: NEGATIVE mg/dL
Specific Gravity, Urine: 1.035 — ABNORMAL HIGH (ref 1.005–1.030)
pH: 7 (ref 5.0–8.0)

## 2018-08-10 LAB — COMPREHENSIVE METABOLIC PANEL
ALT: 15 U/L (ref 0–44)
AST: 18 U/L (ref 15–41)
Albumin: 4.1 g/dL (ref 3.5–5.0)
Alkaline Phosphatase: 53 U/L (ref 38–126)
Anion gap: 9 (ref 5–15)
BUN: 11 mg/dL (ref 6–20)
CHLORIDE: 101 mmol/L (ref 98–111)
CO2: 23 mmol/L (ref 22–32)
Calcium: 8.9 mg/dL (ref 8.9–10.3)
Creatinine, Ser: 0.55 mg/dL — ABNORMAL LOW (ref 0.61–1.24)
Glucose, Bld: 392 mg/dL — ABNORMAL HIGH (ref 70–99)
POTASSIUM: 3.6 mmol/L (ref 3.5–5.1)
Sodium: 133 mmol/L — ABNORMAL LOW (ref 135–145)
Total Bilirubin: 0.7 mg/dL (ref 0.3–1.2)
Total Protein: 7.3 g/dL (ref 6.5–8.1)

## 2018-08-10 LAB — CBG MONITORING, ED
GLUCOSE-CAPILLARY: 311 mg/dL — AB (ref 70–99)
GLUCOSE-CAPILLARY: 285 mg/dL — AB (ref 70–99)
Glucose-Capillary: 411 mg/dL — ABNORMAL HIGH (ref 70–99)

## 2018-08-10 LAB — TSH: TSH: 0.658 u[IU]/mL (ref 0.350–4.500)

## 2018-08-10 LAB — BLOOD GAS, VENOUS
Acid-base deficit: 1.3 mmol/L (ref 0.0–2.0)
Bicarbonate: 22.7 mmol/L (ref 20.0–28.0)
O2 Saturation: 77.2 %
PO2 VEN: 43.2 mmHg (ref 32.0–45.0)
pCO2, Ven: 40.9 mmHg — ABNORMAL LOW (ref 44.0–60.0)
pH, Ven: 7.371 (ref 7.250–7.430)

## 2018-08-10 LAB — RAPID URINE DRUG SCREEN, HOSP PERFORMED
AMPHETAMINES: NOT DETECTED
Barbiturates: NOT DETECTED
Benzodiazepines: NOT DETECTED
Cocaine: NOT DETECTED
OPIATES: NOT DETECTED
Tetrahydrocannabinol: NOT DETECTED

## 2018-08-10 LAB — MAGNESIUM: MAGNESIUM: 1.7 mg/dL (ref 1.7–2.4)

## 2018-08-10 LAB — TROPONIN I: Troponin I: <0.03 ng/mL (ref <0.03)

## 2018-08-10 IMAGING — DX DG CHEST 2V
2 series · 2 of 2 positions shown · non-contrast
Comparison: None.

CLINICAL DATA: Cardiac palpitations

EXAM:
CHEST - 2 VIEW

[chest pa]
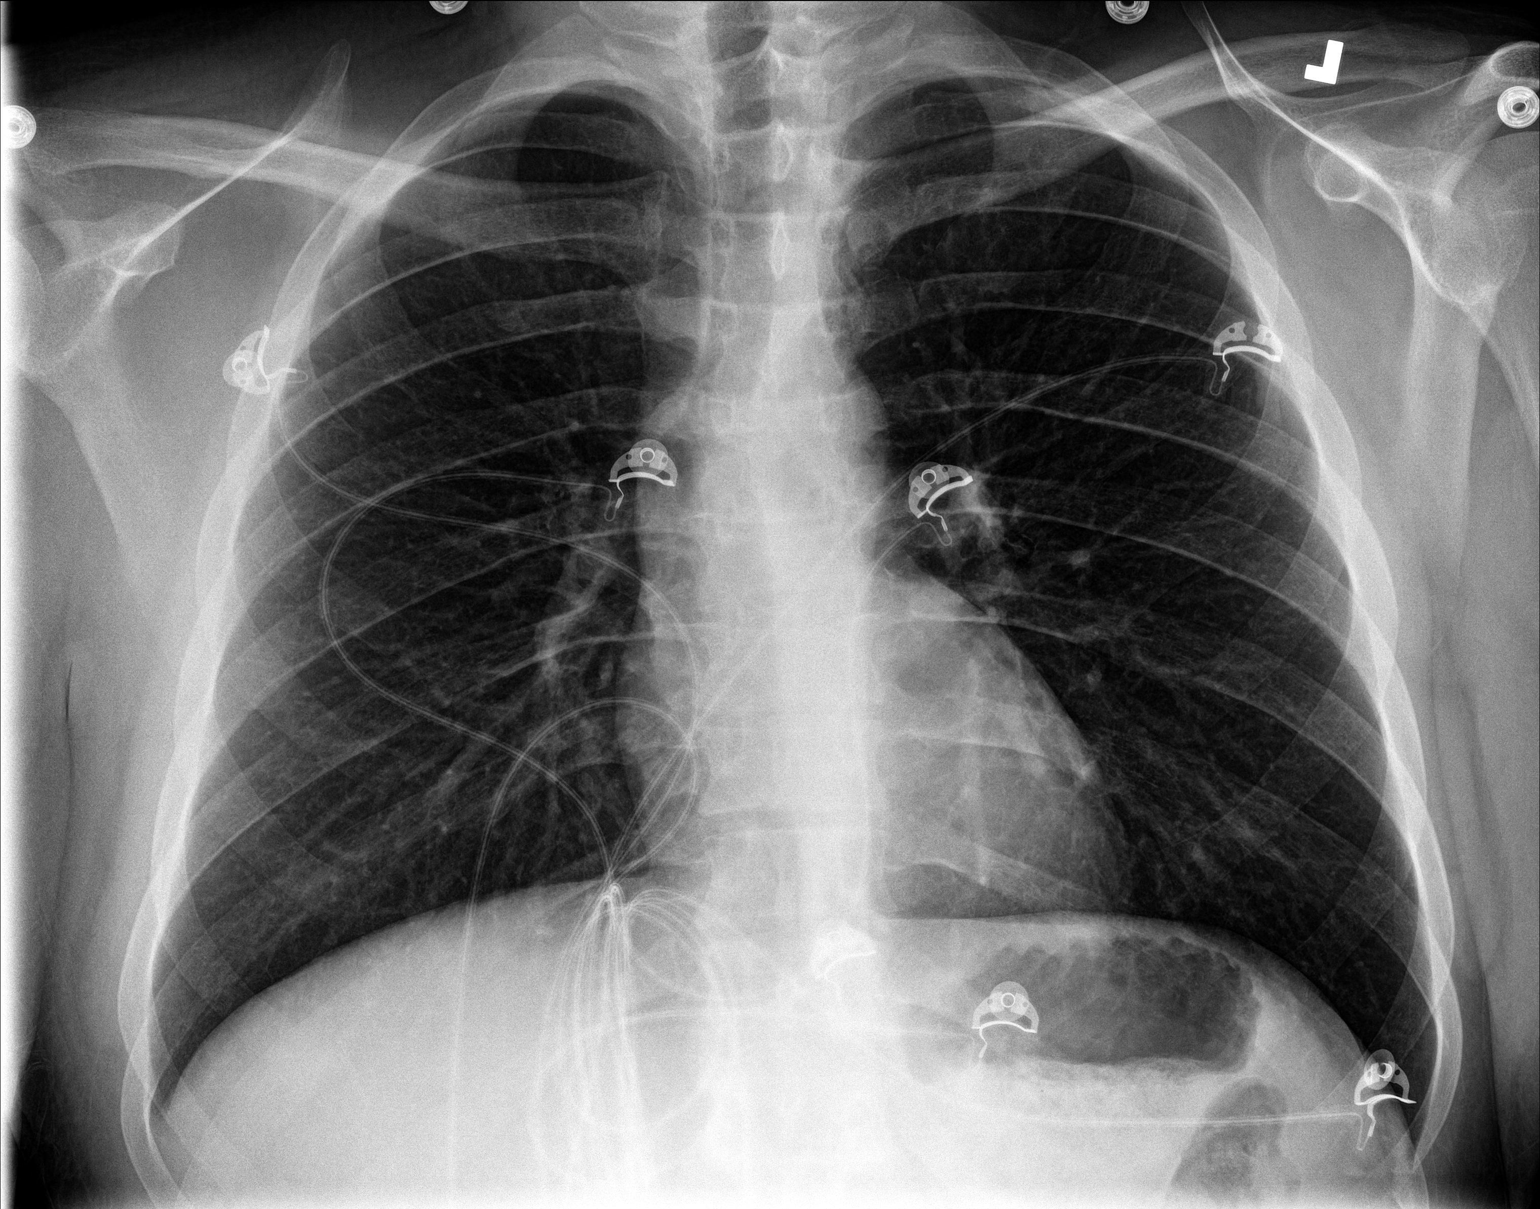

[chest lat]
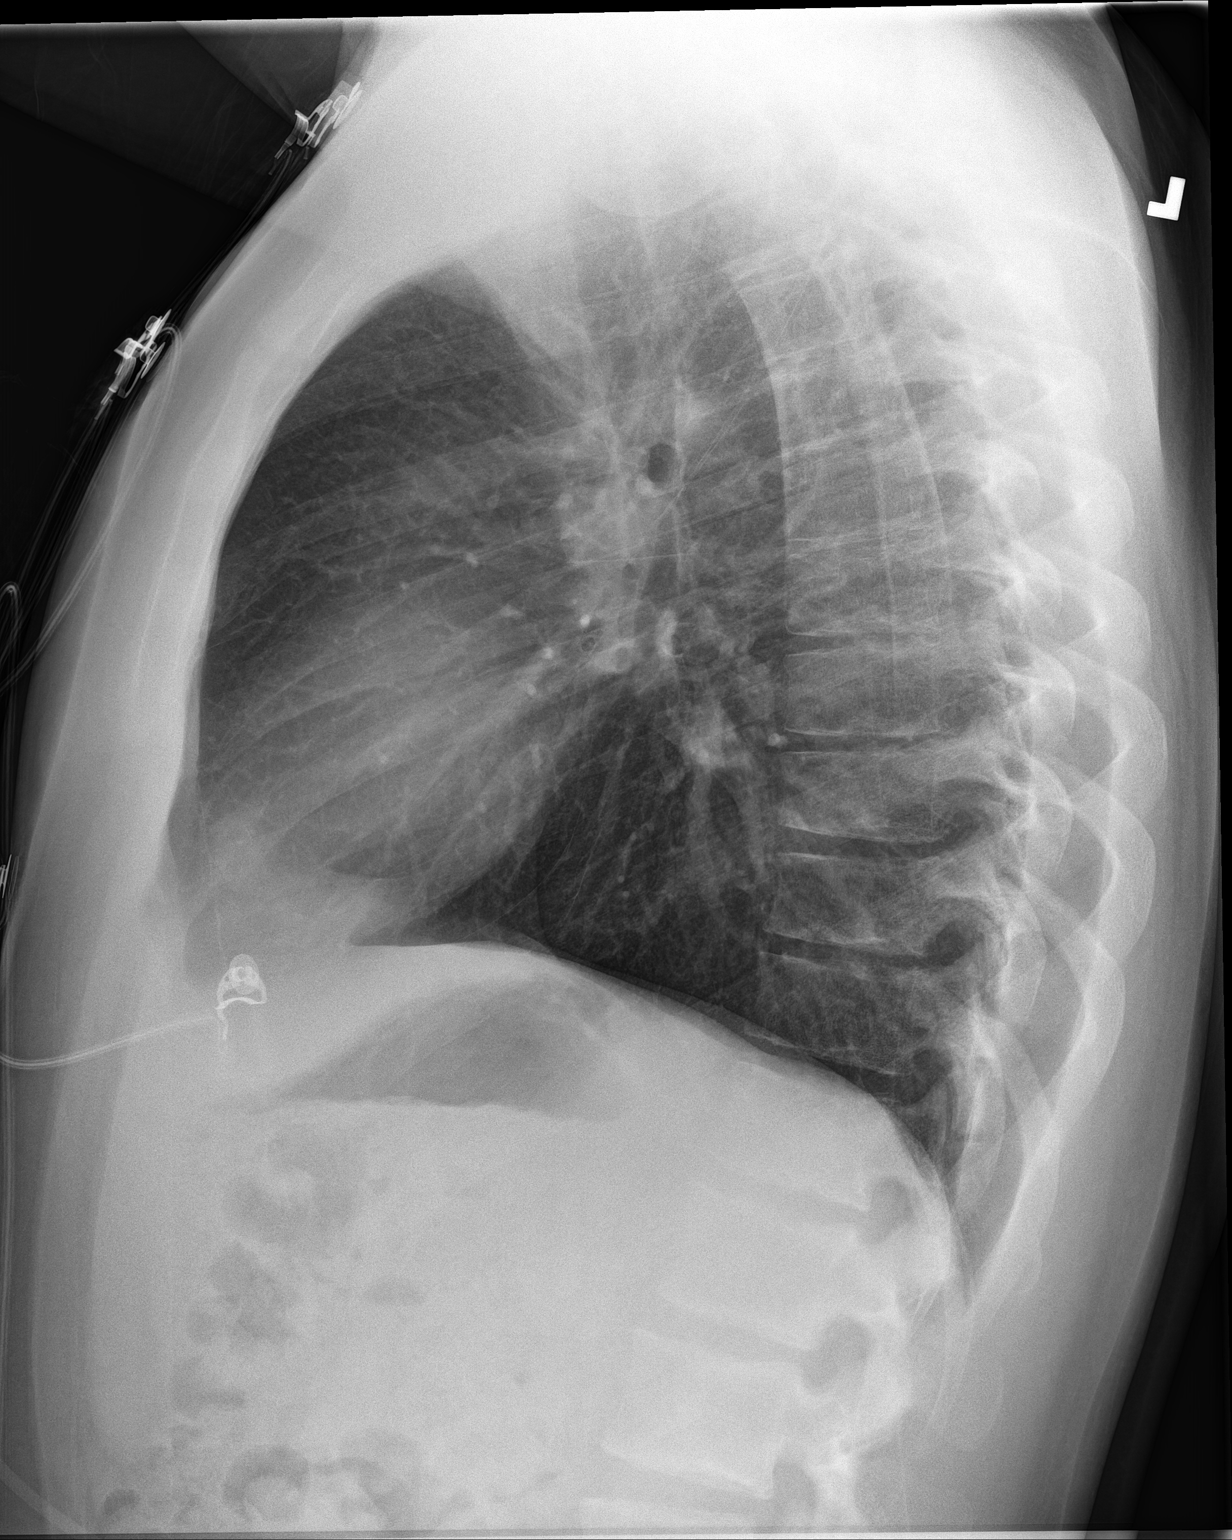

[2 of 2 positions shown; findings below may reference images not displayed]

FINDINGS: Lungs are clear. Heart size and pulmonary vascularity are normal. No
adenopathy. No pneumothorax. No evident bone lesions.
IMPRESSION: No edema or consolidation.

## 2018-08-10 MED ORDER — INSULIN PEN NEEDLE 32G X 4 MM MISC: 1.0000 | Freq: Four times a day (QID) | 3 refills | Status: DC

## 2018-08-10 MED ORDER — HYDROCHLOROTHIAZIDE 25 MG PO TABS: 25.0000 mg | ORAL_TABLET | Freq: Every day | ORAL | 0 refills | Status: DC

## 2018-08-10 MED ORDER — ACCU-CHEK SOFT TOUCH LANCETS MISC: 12 refills | Status: DC

## 2018-08-10 MED ORDER — INSULIN ASPART 100 UNIT/ML ~~LOC~~ SOLN
10.0000 [IU] | Freq: Once | SUBCUTANEOUS | Status: AC
  Administered 2018-08-10: 10 [IU] via INTRAVENOUS
  Filled 2018-08-10: qty 1

## 2018-08-10 MED ORDER — SODIUM CHLORIDE 0.9 % IV BOLUS
1000.0000 mL | Freq: Once | INTRAVENOUS | Status: AC
  Administered 2018-08-10: 1000 mL via INTRAVENOUS

## 2018-08-10 MED ORDER — INSULIN GLARGINE 100 UNIT/ML SOLOSTAR PEN: 40.0000 [IU] | PEN_INJECTOR | Freq: Every day | SUBCUTANEOUS | 2 refills | Status: DC

## 2018-08-10 MED ORDER — GLUCOSE BLOOD VI STRP: ORAL_STRIP | 5 refills | Status: DC

## 2018-08-10 MED ORDER — INSULIN ASPART 100 UNIT/ML ~~LOC~~ SOLN
10.0000 [IU] | Freq: Once | SUBCUTANEOUS | Status: AC
  Administered 2018-08-10: 10 [IU] via INTRAVENOUS

## 2018-08-10 NOTE — ED Provider Notes (Addendum)
[facility] EMERGENCY DEPARTMENT Provider Note   CSN: [id number] Arrival date & time: 08/10/18  1352     History   Chief Complaint Chief Complaint  Patient presents with  . Palpitations    HPI [name] is a 22 y.o. male.  Pt presents to the ED today with palpitations.  He said he woke up today not feeling well.  He has a hx of type 2 DM, but has not been taking his medications.  He did not feel like they worked.  He last took any insulin in January.  The pt also has a hx of hypertension, but does not take his bp meds either.  The pt does not check his blood sugar regularly, but did check it this morning, but does not know what it was.  He thinks it was around 300.  He denies any cp.  No sob.     Past Medical History:  Diagnosis Date  . Diabetes mellitus, type II ([place])   . Heart murmur   . Hypertension     Patient Active Problem List   Diagnosis Date Noted  . Personal history of noncompliance with medical treatment, presenting hazards to health 03/15/2017  . Essential hypertension, benign 03/15/2017  . Uncontrolled type 2 diabetes mellitus with complication ([place]) 62/22/9798    Past Surgical History:  Procedure Laterality Date  . 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 Authorizing Provider  glucose blood (ACCU-CHEK AVIVA) test strip Use as instructed 4 x daily. E11.65 08/10/18   Isla Pence, MD  hydrochlorothiazide (HYDRODIURIL) 25 MG tablet Take 1 tablet (25 mg total) by mouth daily. 08/10/18   Isla Pence, MD  Insulin Glargine (LANTUS SOLOSTAR) 100 UNIT/ML Solostar Pen Inject 50 Units into the skin at bedtime. 08/27/18   Cassandria Anger, MD  Insulin Pen Needle (BD PEN NEEDLE NANO U/F) 32G X 4 MM MISC 1 each by Does not apply route 4 (four) times daily. 08/10/18   Isla Pence, MD  Lancet Devices [facility]) lancets Use  as instructed 4 x daily. e11.65 Patient not taking: Reported on 08/26/2018 04/03/17   Cassandria Anger, MD  Lancets (ACCU-CHEK SOFT TOUCH) lancets Use as instructed 08/10/18   Isla Pence, MD  metFORMIN (GLUCOPHAGE) 500 MG tablet Take by mouth 2 (two) times daily with a meal.    [provider]    Family History Family History  Problem Relation Age of Onset  . Diabetes Maternal Grandmother   . Thyroid disease Maternal Grandmother   . Hypertension Maternal Grandmother     Social History Social History   Tobacco Use  . Smoking status: Passive Smoke Exposure - Never Smoker  . Smokeless tobacco: Never Used  Substance Use Topics  . Alcohol use: No  . Drug use: No     Allergies   Amoxicillin   Review of Systems Review of Systems  Cardiovascular: Positive for palpitations.  Neurological: Positive for weakness.  All other systems reviewed and are negative.    Physical Exam Updated Vital Signs BP 132/88   Pulse (!) 117   Temp 98 F (36.7 C) (Oral) Comment: Simultaneous filing. User may not have seen previous data. Comment (Src): Simultaneous filing. User may not have seen previous data.  Resp (!) 24   Ht 6\' 2"  (1.88 m)   Wt 83.9  kg   SpO2 100%   BMI 23.75 kg/m   Physical Exam  Constitutional: He is oriented to person, place, and time. He appears well-developed and well-nourished.  HENT:  Head: Normocephalic and atraumatic.  Right Ear: External ear normal.  Left Ear: External ear normal.  Nose: Nose normal.  Mouth/Throat: Mucous membranes are dry.  Eyes: Pupils are equal, round, and reactive to light. Conjunctivae and EOM are normal.  Neck: Normal range of motion. Neck supple.  Cardiovascular: Regular rhythm, normal heart sounds and intact distal pulses. Tachycardia present.  Pulmonary/Chest: Effort normal and breath sounds normal.  Abdominal: Soft. Bowel sounds are normal.  Musculoskeletal: Normal range of motion.  Neurological: He is alert and  oriented to person, place, and time.  Skin: Skin is warm. Capillary refill takes less than 2 seconds.  Psychiatric: He has a normal mood and affect. His behavior is normal. Judgment and thought content normal.  Nursing note and vitals reviewed.    ED Treatments / Results  Labs (all labs ordered are listed, but only abnormal results are displayed) Labs Reviewed  COMPREHENSIVE METABOLIC PANEL - Abnormal; Notable for the following components:      Result Value   Sodium 133 (*)    Glucose, Bld 392 (*)    Creatinine, Ser 0.55 (*)    All other components within normal limits  CBC WITH DIFFERENTIAL/PLATELET - Abnormal; Notable for the following components:   WBC 19.9 (*)    Neutro Abs 15.6 (*)    Monocytes Absolute 1.6 (*)    All other components within normal limits  URINALYSIS, ROUTINE W REFLEX MICROSCOPIC - Abnormal; Notable for the following components:   Color, Urine STRAW (*)    Specific Gravity, Urine 1.035 (*)    Glucose, UA >=500 (*)    Ketones, ur 5 (*)    All other components within normal limits  BLOOD GAS, VENOUS - Abnormal; Notable for the following components:   pCO2, Ven 40.9 (*)    All other components within normal limits  CBG MONITORING, ED - Abnormal; Notable for the following components:   Glucose-Capillary 411 (*)    All other components within normal limits  CBG MONITORING, ED - Abnormal; Notable for the following components:   Glucose-Capillary 311 (*)    All other components within normal limits  CBG MONITORING, ED - Abnormal; Notable for the following components:   Glucose-Capillary 285 (*)    All other components within normal limits  TROPONIN I  TSH  MAGNESIUM  RAPID URINE DRUG SCREEN, HOSP PERFORMED  CBG MONITORING, ED    EKG EKG Interpretation  Date/Time:  Sunday August 10 2018 13:55:13 EDT Ventricular Rate:  123 PR Interval:    QRS Duration: 91 QT Interval:  295 QTC Calculation: 422 R Axis:   11 Text Interpretation:  Sinus tachycardia  Abnormal Q suggests anterior infarct No old tracing to compare Confirmed by Isla Pence [phone]) on 08/10/2018 2:10:10 PM   Radiology No results found.  Procedures Procedures (including critical care time)  Medications Ordered in ED Medications  sodium chloride 0.9 % bolus 1,000 mL (0 mLs Intravenous Stopped 08/10/18 1516)  insulin aspart (novoLOG) injection 10 Units (10 Units Intravenous Given 08/10/18 1517)  sodium chloride 0.9 % bolus 1,000 mL (0 mLs Intravenous Stopped 08/10/18 1659)  insulin aspart (novoLOG) injection 10 Units (10 Units Intravenous Given 08/10/18 1633)  sodium chloride 0.9 % bolus 1,000 mL (0 mLs Intravenous Stopped 08/10/18 1831)     Initial Impression / Assessment and  Plan / ED Course  I have reviewed the triage vital signs and the nursing notes.  Pertinent labs & imaging results that were available during my care of the patient were reviewed by me and considered in my medical decision making (see chart for details).    CRITICAL CARE Performed by: Isla Pence   Total critical care time: 30 minutes  Critical care time was exclusive of separately billable procedures and treating other patients.  Critical care was necessary to treat or prevent imminent or life-threatening deterioration.  Critical care was time spent personally by me on the following activities: development of treatment plan with patient and/or surrogate as well as nursing, discussions with consultants, evaluation of patient's response to treatment, examination of patient, obtaining history from patient or surrogate, ordering and performing treatments and interventions, ordering and review of laboratory studies, ordering and review of radiographic studies, pulse oximetry and re-evaluation of patient's condition. HR has improved. With IVFs.  BS has improved with IV insulin. Pt encouraged to take his meds and to check his blood sugars QID.  He is given refills on all his meds.  F/u with his  endocrinologist and pcp.  Return if worse.  Final Clinical Impressions(s) / ED Diagnoses   Final diagnoses:  Dehydration  Poorly controlled type 2 diabetes mellitus [facility])    ED Discharge Orders         Ordered    glucose blood (ACCU-CHEK AVIVA) test strip     08/10/18 1537    hydrochlorothiazide (HYDRODIURIL) 25 MG tablet  Daily     08/10/18 1537    Insulin Glargine (LANTUS SOLOSTAR) 100 UNIT/ML Solostar Pen  Daily at 10 pm,   Status:  Discontinued     08/10/18 1537    Insulin Pen Needle (BD PEN NEEDLE NANO U/F) 32G X 4 MM MISC  4 times daily     08/10/18 1537    Lancets (ACCU-CHEK SOFT TOUCH) lancets     08/10/18 1537           Isla Pence, MD 08/10/18 Johnnye Lana    Isla Pence, MD 08/30/18 [phone]

## 2018-08-10 NOTE — ED Triage Notes (Signed)
Pt reports feeling of palpitations earlier today.  EMS arrived to find HR 150-160.  Could not determine if ST or SVT so no meds given.  Pt denies any symptoms currently.  Is diabetic and is not taking medications for it.

## 2018-08-10 NOTE — ED Notes (Signed)
Phlebotomy at bedside.

## 2018-08-10 NOTE — Discharge Instructions (Signed)
Check your blood sugars 4 times a day (before meals and at bedtime).  Write them down and share this with Dr. Dorris Fetch.

## 2018-08-10 NOTE — ED Notes (Signed)
EDP at bedside updating patient and family. 

## 2018-08-10 NOTE — ED Notes (Signed)
Lab notified of venous blood gas.

## 2018-08-12 ENCOUNTER — Other Ambulatory Visit: Payer: Self-pay | Admitting: "Endocrinology

## 2018-08-12 DIAGNOSIS — E1165 Type 2 diabetes mellitus with hyperglycemia: Secondary | ICD-10-CM

## 2018-08-14 LAB — BASIC METABOLIC PANEL
BUN: 17 (ref 4–21)
CREATININE: 0.7 (ref 0.6–1.3)

## 2018-08-14 LAB — TSH: TSH: 0.41 (ref 0.41–5.90)

## 2018-08-14 LAB — HEMOGLOBIN A1C: Hemoglobin A1C: 12.5

## 2018-08-18 ENCOUNTER — Other Ambulatory Visit (HOSPITAL_BASED_OUTPATIENT_CLINIC_OR_DEPARTMENT_OTHER): Payer: Self-pay

## 2018-08-18 DIAGNOSIS — G4730 Sleep apnea, unspecified: Secondary | ICD-10-CM

## 2018-08-18 DIAGNOSIS — R0683 Snoring: Secondary | ICD-10-CM

## 2018-08-26 ENCOUNTER — Encounter (HOSPITAL_COMMUNITY): Payer: Self-pay | Admitting: *Deleted

## 2018-08-26 ENCOUNTER — Other Ambulatory Visit: Payer: Self-pay

## 2018-08-26 ENCOUNTER — Emergency Department (HOSPITAL_COMMUNITY)
Admission: EM | Admit: 2018-08-26 | Discharge: 2018-08-26 | Disposition: A | Discharge status: Home / Self Care | Payer: Medicaid Other | Attending: Emergency Medicine | Admitting: Emergency Medicine

## 2018-08-26 ENCOUNTER — Emergency Department (HOSPITAL_COMMUNITY): Payer: Medicaid Other

## 2018-08-26 DIAGNOSIS — R110 Nausea: Secondary | ICD-10-CM | POA: Insufficient documentation

## 2018-08-26 DIAGNOSIS — I10 Essential (primary) hypertension: Secondary | ICD-10-CM | POA: Insufficient documentation

## 2018-08-26 DIAGNOSIS — Z794 Long term (current) use of insulin: Secondary | ICD-10-CM | POA: Diagnosis not present

## 2018-08-26 DIAGNOSIS — Z7722 Contact with and (suspected) exposure to environmental tobacco smoke (acute) (chronic): Secondary | ICD-10-CM | POA: Insufficient documentation

## 2018-08-26 DIAGNOSIS — R0602 Shortness of breath: Secondary | ICD-10-CM | POA: Diagnosis not present

## 2018-08-26 DIAGNOSIS — E0865 Diabetes mellitus due to underlying condition with hyperglycemia: Secondary | ICD-10-CM

## 2018-08-26 DIAGNOSIS — R0789 Other chest pain: Secondary | ICD-10-CM | POA: Insufficient documentation

## 2018-08-26 DIAGNOSIS — Z79899 Other long term (current) drug therapy: Secondary | ICD-10-CM | POA: Diagnosis not present

## 2018-08-26 DIAGNOSIS — R002 Palpitations: Secondary | ICD-10-CM

## 2018-08-26 DIAGNOSIS — E1165 Type 2 diabetes mellitus with hyperglycemia: Secondary | ICD-10-CM | POA: Insufficient documentation

## 2018-08-26 DIAGNOSIS — R000 Tachycardia, unspecified: Secondary | ICD-10-CM

## 2018-08-26 HISTORY — DX: Cardiac murmur, unspecified: R01.1

## 2018-08-26 LAB — TROPONIN I: Troponin I: <0.03 ng/mL (ref <0.03)

## 2018-08-26 LAB — CBC WITH DIFFERENTIAL/PLATELET
BASOS ABS: 0 10*3/uL (ref 0.0–0.1)
Basophils Relative: 0 %
EOS PCT: 0 %
Eosinophils Absolute: 0 10*3/uL (ref 0.0–0.7)
HEMATOCRIT: 45.5 % (ref 39.0–52.0)
Hemoglobin: 15.7 g/dL (ref 13.0–17.0)
LYMPHS PCT: 11 %
Lymphs Abs: 1.4 10*3/uL (ref 0.7–4.0)
MCH: 28.9 pg (ref 26.0–34.0)
MCHC: 34.5 g/dL (ref 30.0–36.0)
MCV: 83.6 fL (ref 78.0–100.0)
Monocytes Absolute: 1 10*3/uL (ref 0.1–1.0)
Monocytes Relative: 8 %
NEUTROS ABS: 10.8 10*3/uL — AB (ref 1.7–7.7)
NEUTROS PCT: 81 %
PLATELETS: 258 10*3/uL (ref 150–400)
RBC: 5.44 MIL/uL (ref 4.22–5.81)
RDW: 12.7 % (ref 11.5–15.5)
WBC: 13.2 10*3/uL — AB (ref 4.0–10.5)

## 2018-08-26 LAB — COMPREHENSIVE METABOLIC PANEL
ALT: 35 U/L (ref 0–44)
ANION GAP: 9 (ref 5–15)
AST: 30 U/L (ref 15–41)
Albumin: 4.7 g/dL (ref 3.5–5.0)
Alkaline Phosphatase: 54 U/L (ref 38–126)
BILIRUBIN TOTAL: 1.2 mg/dL (ref 0.3–1.2)
BUN: 12 mg/dL (ref 6–20)
CO2: 26 mmol/L (ref 22–32)
Calcium: 9.8 mg/dL (ref 8.9–10.3)
Chloride: 100 mmol/L (ref 98–111)
Creatinine, Ser: 0.66 mg/dL (ref 0.61–1.24)
Glucose, Bld: 288 mg/dL — ABNORMAL HIGH (ref 70–99)
POTASSIUM: 3.5 mmol/L (ref 3.5–5.1)
Sodium: 135 mmol/L (ref 135–145)
TOTAL PROTEIN: 8.6 g/dL — AB (ref 6.5–8.1)

## 2018-08-26 LAB — MAGNESIUM: Magnesium: 1.9 mg/dL (ref 1.7–2.4)

## 2018-08-26 LAB — RAPID URINE DRUG SCREEN, HOSP PERFORMED
Amphetamines: NOT DETECTED
Barbiturates: NOT DETECTED
Benzodiazepines: NOT DETECTED
COCAINE: NOT DETECTED
OPIATES: NOT DETECTED
TETRAHYDROCANNABINOL: NOT DETECTED

## 2018-08-26 LAB — CBG MONITORING, ED: Glucose-Capillary: 260 mg/dL — ABNORMAL HIGH (ref 70–99)

## 2018-08-26 LAB — D-DIMER, QUANTITATIVE: D-Dimer, Quant: <0.27 ug/mL-FEU (ref 0.00–0.50)

## 2018-08-26 LAB — TSH: TSH: 0.517 u[IU]/mL (ref 0.350–4.500)

## 2018-08-26 IMAGING — DX DG CHEST 2V
2 series · 2 of 2 positions shown · non-contrast
Comparison: [DATE]

CLINICAL DATA: Shortness of breath

EXAM:
CHEST - 2 VIEW

[chest pa]
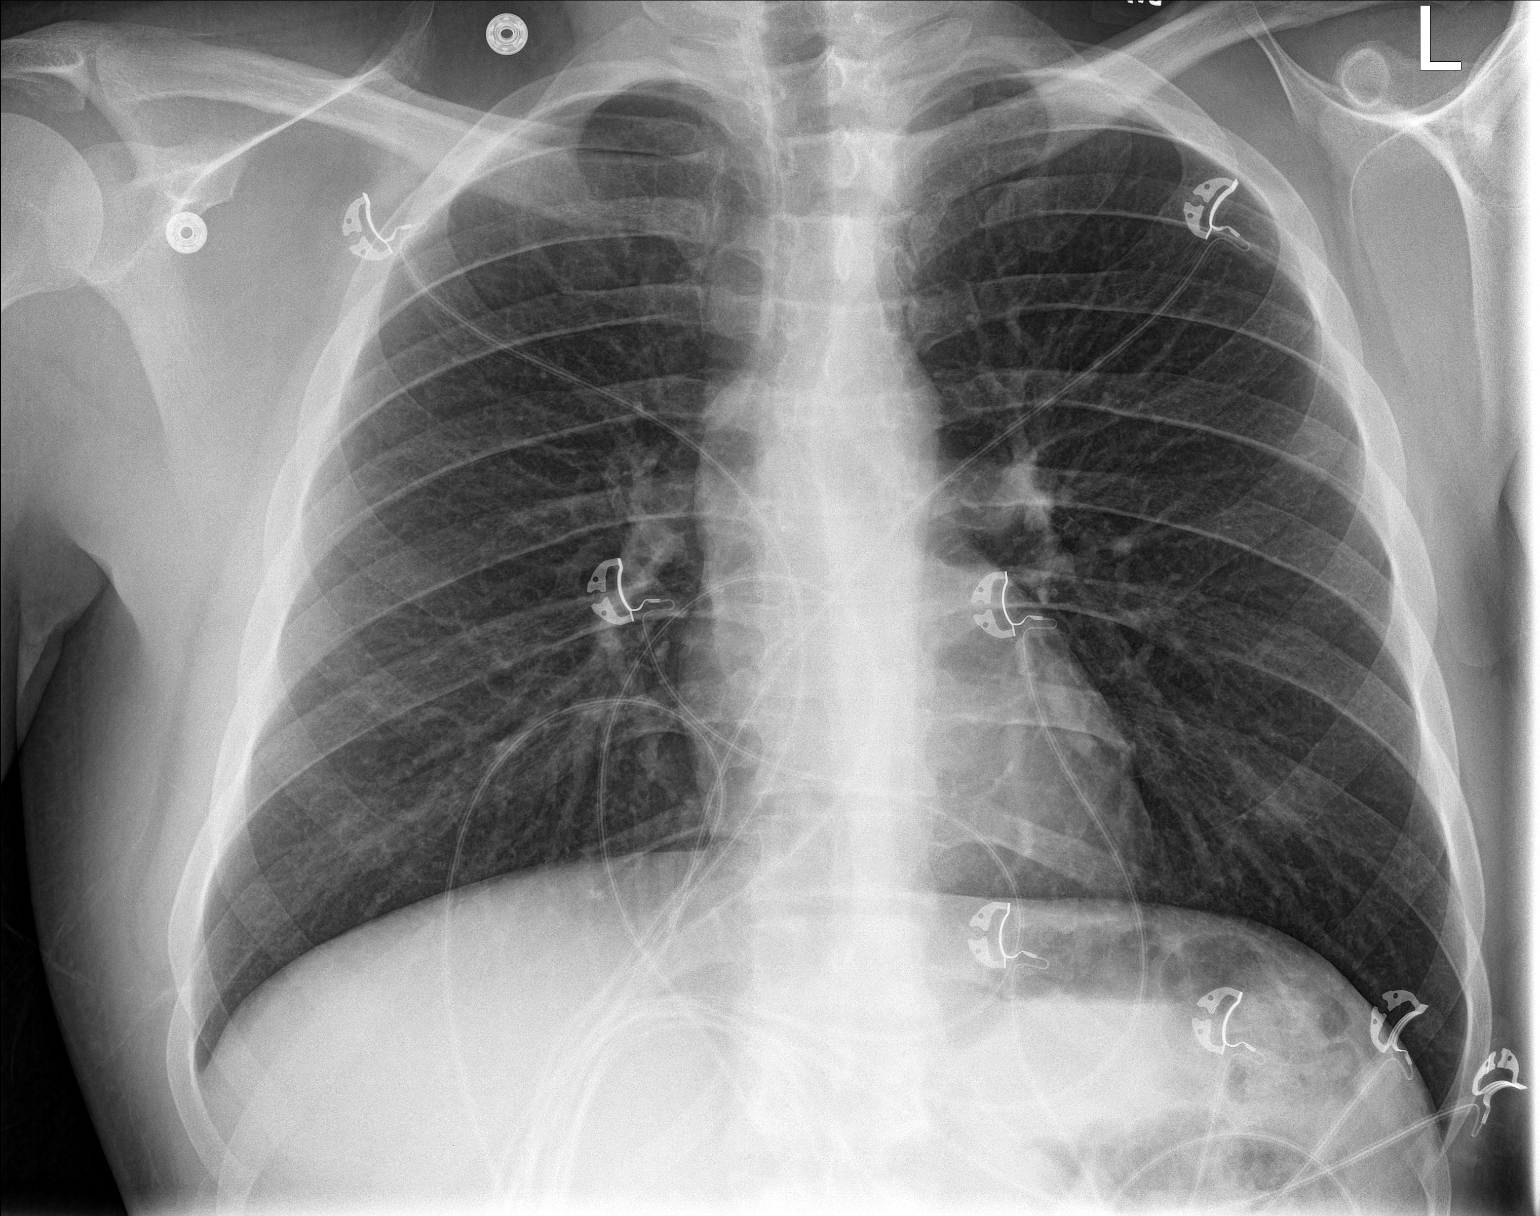

[chest lat]
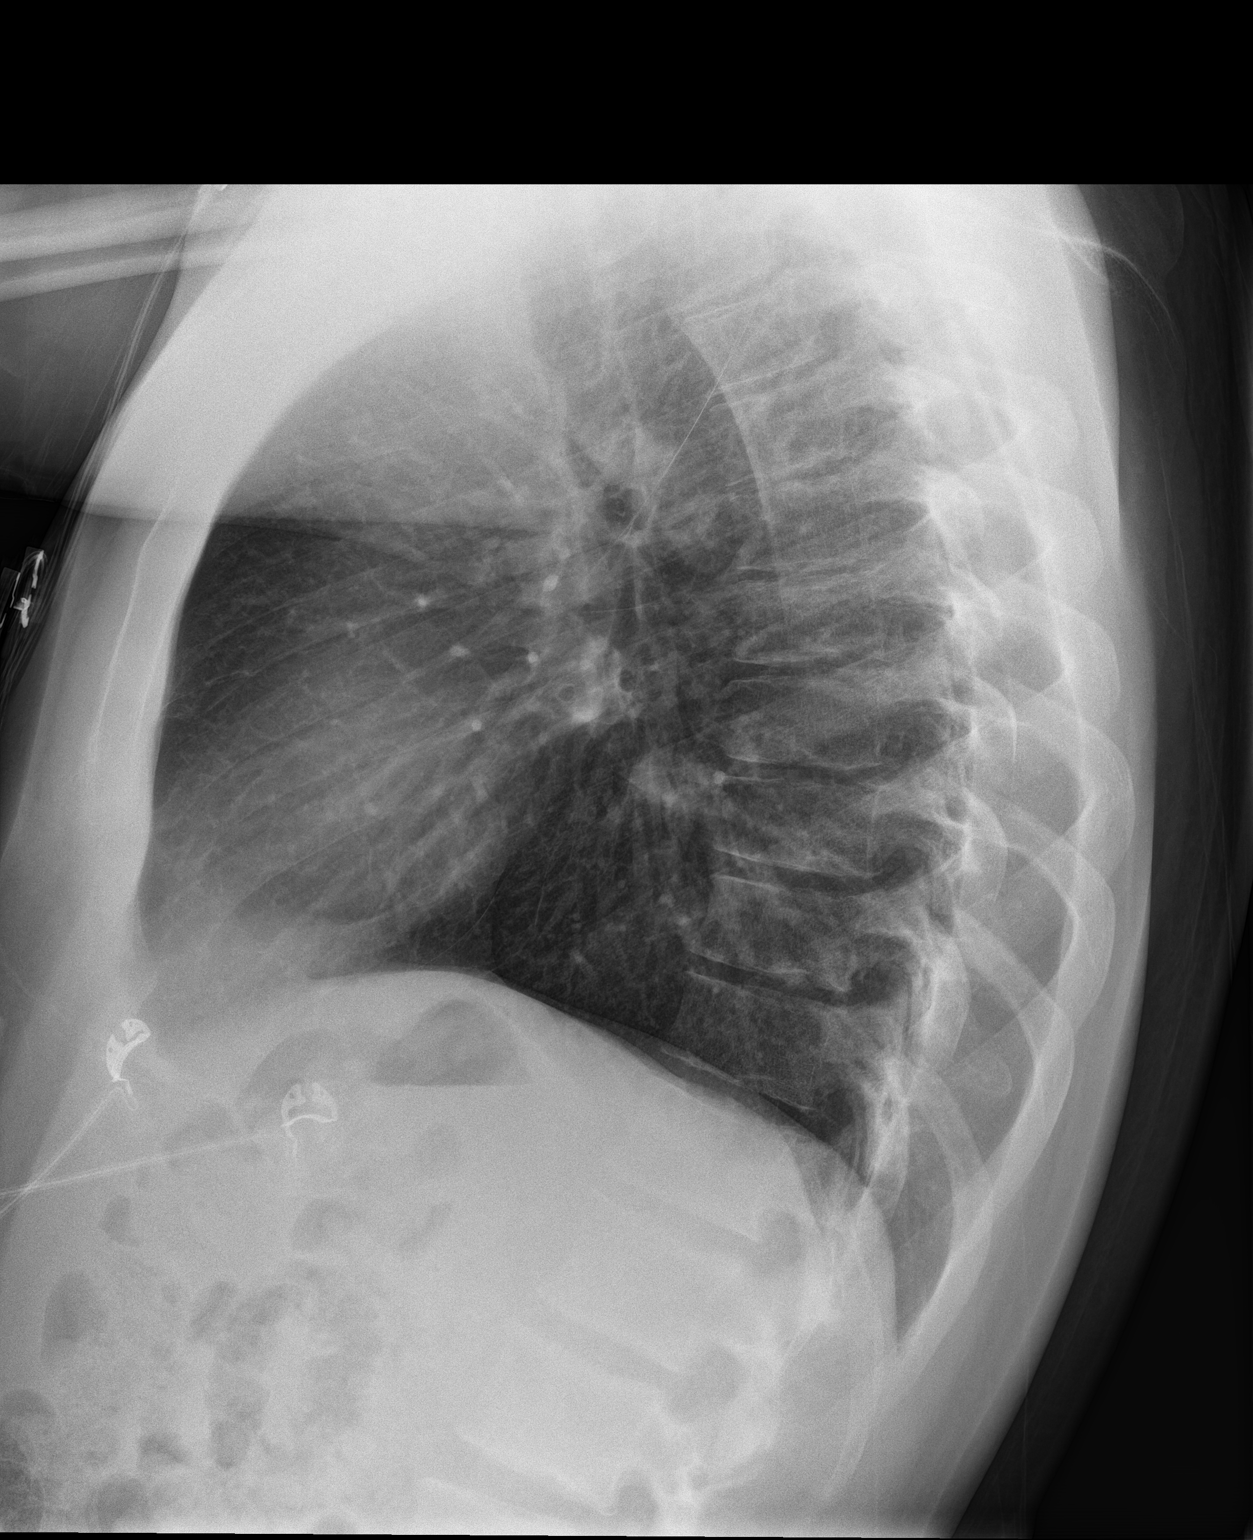

[2 of 2 positions shown; findings below may reference images not displayed]

FINDINGS: The heart size and mediastinal contours are within normal limits.
Both lungs are clear. The visualized skeletal structures are
unremarkable.
IMPRESSION: No active cardiopulmonary disease.

## 2018-08-26 MED ORDER — LORAZEPAM 2 MG/ML IJ SOLN
0.5000 mg | Freq: Once | INTRAMUSCULAR | Status: AC
  Administered 2018-08-26: 0.5 mg via INTRAVENOUS
  Filled 2018-08-26: qty 1

## 2018-08-26 MED ORDER — INSULIN ASPART 100 UNIT/ML IV SOLN
8.0000 [IU] | Freq: Once | INTRAVENOUS | Status: AC
  Administered 2018-08-26: 5 [IU] via INTRAVENOUS

## 2018-08-26 MED ORDER — ACETAMINOPHEN 500 MG PO TABS
1000.0000 mg | ORAL_TABLET | Freq: Once | ORAL | Status: AC
  Administered 2018-08-26: 1000 mg via ORAL
  Filled 2018-08-26: qty 2

## 2018-08-26 MED ORDER — SODIUM CHLORIDE 0.9 % IV BOLUS
1000.0000 mL | Freq: Once | INTRAVENOUS | Status: AC
  Administered 2018-08-26: 1000 mL via INTRAVENOUS

## 2018-08-26 NOTE — ED Notes (Signed)
Pt taken to xray 

## 2018-08-26 NOTE — Discharge Instructions (Addendum)
Stay well hydrated. Call the Cardiologist listed for an appointment and outpatient ECHO.

## 2018-08-26 NOTE — ED Provider Notes (Signed)
Blood pressure 129/83, pulse (!) 116, temperature 98.7 F (37.1 C), temperature source Oral, resp. rate 16, height 6\' 2"  (1.88 m), weight 83.9 kg, SpO2 100 %.  Assuming care from Dr. Reather Converse.  In short, [name] is a 22 y.o. male with a chief complaint of Shortness of Breath .  Refer to the original H&P for additional details.  The current plan of care is to f/u on HR and reassess.  HR improved. Patient feeling well. Will discharge with plan for outpatient f/u for ECHO.   At this time, I do not feel there is any life-threatening condition present. I have reviewed and discussed all results (EKG, imaging, lab, urine as appropriate), exam findings with patient. I have reviewed nursing notes and appropriate previous records.  I feel the patient is safe to be discharged home without further emergent workup. Discussed usual and customary return precautions. Patient and family (if present) verbalize understanding and are comfortable with this plan.  Patient will follow-up with their primary care provider. If they do not have a primary care provider, information for follow-up has been provided to them. All questions have been answered.  Nanda Quinton, MD     Margette Fast, MD 08/26/18 2028

## 2018-08-26 NOTE — ED Triage Notes (Signed)
Pt brought in by [facility] with c/o SOB, "fidgety", "unable to get my thoughts together" that started today about 1040. CBG 256 for EMS. BP 134/85, HR 130 for EMS. EMS reports they were called out to pt's household and then pt refused to come to ED. After EMS left they were called back 10 minutes later and then pt wanted to come to ED. EMS reports pt was sitting on the front porch using his neighbor's O2 tank.

## 2018-08-26 NOTE — ED Provider Notes (Signed)
[facility] EMERGENCY DEPARTMENT Provider Note   CSN: [id number] Arrival date & time: 08/26/18  1240     History   Chief Complaint Chief Complaint  Patient presents with  . Shortness of Breath    HPI [name] is a 22 y.o. male.  Patient presents to the ER with multiple different symptoms.  EMS was called out for shortness of breath, feeling shaky and fidgety that started earlier today.  Patient has had this once in the past was seen and evaluated and improved.  Patient denies any known cardiac history, no significant family history, patient has had heart murmur since he was younger.  Patient denies illegal drugs.  Patient mild anterior bilateral chest ache sensation.  No specific pleuritic symptoms.  Patient denies blood clot risk factors.  No fevers or chills recently.  Patient feels generally shaky his whole body.     Past Medical History:  Diagnosis Date  . Diabetes mellitus, type II ([place])   . Heart murmur   . Hypertension     Patient Active Problem List   Diagnosis Date Noted  . Personal history of noncompliance with medical treatment, presenting hazards to health 03/15/2017  . Essential hypertension, benign 03/15/2017  . Uncontrolled type 2 diabetes mellitus with complication ([place]) 37/16/9678    Past Surgical History:  Procedure Laterality Date  . 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 Authorizing Provider  hydrochlorothiazide (HYDRODIURIL) 25 MG tablet Take 1 tablet (25 mg total) by mouth daily. 08/10/18  Yes Isla Pence, MD  Insulin Glargine (LANTUS SOLOSTAR) 100 UNIT/ML Solostar Pen Inject 40 Units into the skin daily at 10 pm. Patient taking differently: Inject 42 Units into the skin daily at 10 pm.  08/10/18  Yes Isla Pence, MD  metFORMIN (GLUCOPHAGE) 500 MG tablet Take by mouth 2 (two) times daily with a meal.   Yes  [provider]  glucose blood (ACCU-CHEK AVIVA) test strip Use as instructed 4 x daily. E11.65 08/10/18   Isla Pence, MD  Insulin Pen Needle (BD PEN NEEDLE NANO U/F) 32G X 4 MM MISC 1 each by Does not apply route 4 (four) times daily. 08/10/18   Isla Pence, MD  Lancet Devices [facility]) lancets Use as instructed 4 x daily. e11.65 Patient not taking: Reported on 08/26/2018 04/03/17   Cassandria Anger, MD  Lancets (ACCU-CHEK SOFT [facility]) lancets Use as instructed 08/10/18   Isla Pence, MD    Family History Family History  Problem Relation Age of Onset  . Diabetes Maternal Grandmother   . Thyroid disease Maternal Grandmother   . Hypertension Maternal Grandmother     Social History Social History   Tobacco Use  . Smoking status: Passive Smoke Exposure - Never Smoker  . Smokeless tobacco: Never Used  Substance Use Topics  . Alcohol use: No  . Drug use: No     Allergies   Amoxicillin   Review of Systems Review of Systems  Constitutional: Positive for appetite change. Negative for chills and fever.  HENT: Negative for congestion.   Eyes: Negative for visual disturbance.  Respiratory: Positive for shortness of breath.   Cardiovascular: Positive for chest pain.  Gastrointestinal: Positive for nausea. Negative for abdominal pain and vomiting.  Genitourinary: Negative for dysuria and flank pain.  Musculoskeletal: Negative for back pain, neck pain and  neck stiffness.  Skin: Negative for rash.  Neurological: Positive for tremors. Negative for light-headedness and headaches.     Physical Exam Updated Vital Signs BP 136/87   Pulse (!) 121   Temp 98.7 F (37.1 C) (Oral)   Resp 10   Ht 6\' 2"  (1.88 m)   Wt 83.9 kg   SpO2 100%   BMI 23.75 kg/m   Physical Exam  Constitutional: He is oriented to person, place, and time. He appears well-developed and well-nourished.  HENT:  Head: Normocephalic and atraumatic.  Dry mm  Eyes: Conjunctivae are  normal. Right eye exhibits no discharge. Left eye exhibits no discharge.  Neck: Normal range of motion. Neck supple. No tracheal deviation present.  Cardiovascular: Regular rhythm. Tachycardia present.  Pulmonary/Chest: Effort normal and breath sounds normal.  Abdominal: Soft. He exhibits no distension. There is no tenderness. There is no guarding.  Musculoskeletal: He exhibits no edema.  Neurological: He is alert and oriented to person, place, and time. He has normal strength. No cranial nerve deficit. GCS eye subscore is 4. GCS verbal subscore is 5. GCS motor subscore is 6.  Mild shaky bilateral arms  Skin: Skin is warm. No rash noted.  Psychiatric: His mood appears anxious.  Nursing note and vitals reviewed.    ED Treatments / Results  Labs (all labs ordered are listed, but only abnormal results are displayed) Labs Reviewed  COMPREHENSIVE METABOLIC PANEL - Abnormal; Notable for the following components:      Result Value   Glucose, Bld 288 (*)    Total Protein 8.6 (*)    All other components within normal limits  CBC WITH DIFFERENTIAL/PLATELET - Abnormal; Notable for the following components:   WBC 13.2 (*)    Neutro Abs 10.8 (*)    All other components within normal limits  TSH  MAGNESIUM  D-DIMER, QUANTITATIVE (NOT AT [facility])  TROPONIN I  RAPID URINE DRUG SCREEN, HOSP PERFORMED    EKG EKG Interpretation  Date/Time:  Tuesday August 26 2018 13:03:34 EDT Ventricular Rate:  113 PR Interval:    QRS Duration: 94 QT Interval:  309 QTC Calculation: 424 R Axis:   32 Text Interpretation:  Sinus tachycardia Consider right atrial enlargement Borderline low voltage, extremity leads Confirmed by Elnora Morrison [id number]) on 08/26/2018 3:48:03 PM   Radiology Dg Chest 2 View  Result Date: 08/26/2018 CLINICAL DATA:  Shortness of breath EXAM: CHEST - 2 VIEW COMPARISON:  08/10/2017 FINDINGS: The heart size and mediastinal contours are within normal limits. Both lungs are clear. The  visualized skeletal structures are unremarkable. IMPRESSION: No active cardiopulmonary disease. Electronically Signed   By: Donavan Foil M.D.   On: 08/26/2018 14:39    Procedures Procedures (including critical care time)   EMERGENCY DEPARTMENT [place] CARDIAC EXAM "Study: Limited Ultrasound of the Heart and Pericardium"  INDICATIONS:Abnormal vital signs Multiple views of the heart and pericardium were obtained in real-time with a multi-frequency probe.  PERFORMED UR:[facility] IMAGES ARCHIVED?: Yes LIMITATIONS:  None VIEWS USED: Subcostal 4 chamber, Parasternal long axis, Parasternal short axis, Apical 4 chamber  and Inferior Vena Cava INTERPRETATION: Cardiac activity present, Pericardial effusioin absent, Normal contractility and IVC normal   Medications Ordered in ED Medications  sodium chloride 0.9 % bolus 1,000 mL (has no administration in time range)  insulin aspart (novoLOG) injection 8 Units (has no administration in time range)  sodium chloride 0.9 % bolus 1,000 mL (0 mLs Intravenous Stopped 08/26/18 1612)  acetaminophen (TYLENOL) tablet 1,000 mg (1,000 mg Oral  Given 08/26/18 1418)  LORazepam (ATIVAN) injection 0.5 mg (0.5 mg Intravenous Given 08/26/18 1520)     Initial Impression / Assessment and Plan / ED Course  I have reviewed the triage vital signs and the nursing notes.  Pertinent labs & imaging results that were available during my care of the patient were reviewed by me and considered in my medical decision making (see chart for details).   Patient presents with shortness of breath, tremors, palpitations.  Broad work-up included general blood work checking electrolytes, hemoglobin, d-dimer as patient is low risk for blood clots and everything unremarkable except for glucose which is elevated and non specific leukocytosis. Pt has no fever or significant cough. CXR clear.  Patient has history of noncompliance and uncontrolled diabetes.  Clinically concern for dehydration as well as  possible anxiety.  Patient's heart rate still 115 on recheck. Bedside ultrasound no cardiac effusion seen. Patient given small dose of Ativan to see if it would help.  Plan for repeat bolus of fluid, insulin and reassessment.  If heart rate continues to trend towards 100 patient stable for outpatient follow-up with cardiology.  If worsens plan for reassessment and possible admission for formal echo.  Signed out to Dr. Laverta Baltimore.  Final Clinical Impressions(s) / ED Diagnoses   Final diagnoses:  Sinus tachycardia  Diabetes mellitus due to underlying condition with hyperglycemia, with long-term current use of insulin ([place])  Palpitations  Shortness of breath    ED Discharge Orders    None       Elnora Morrison, MD 08/26/18 [phone]

## 2018-08-27 ENCOUNTER — Encounter: Payer: Self-pay | Admitting: "Endocrinology

## 2018-08-27 ENCOUNTER — Other Ambulatory Visit: Payer: Self-pay | Admitting: "Endocrinology

## 2018-08-27 ENCOUNTER — Ambulatory Visit (INDEPENDENT_AMBULATORY_CARE_PROVIDER_SITE_OTHER): Payer: Medicaid Other | Admitting: "Endocrinology

## 2018-08-27 VITALS — BP 135/86 | HR 90 | Ht 74.0 in | Wt 189.0 lb

## 2018-08-27 DIAGNOSIS — E118 Type 2 diabetes mellitus with unspecified complications: Secondary | ICD-10-CM

## 2018-08-27 DIAGNOSIS — Z9119 Patient's noncompliance with other medical treatment and regimen: Secondary | ICD-10-CM | POA: Diagnosis not present

## 2018-08-27 DIAGNOSIS — IMO0002 Reserved for concepts with insufficient information to code with codable children: Secondary | ICD-10-CM

## 2018-08-27 DIAGNOSIS — E1165 Type 2 diabetes mellitus with hyperglycemia: Secondary | ICD-10-CM

## 2018-08-27 DIAGNOSIS — Z91199 Patient's noncompliance with other medical treatment and regimen due to unspecified reason: Secondary | ICD-10-CM

## 2018-08-27 DIAGNOSIS — E1065 Type 1 diabetes mellitus with hyperglycemia: Secondary | ICD-10-CM

## 2018-08-27 MED ORDER — INSULIN GLARGINE 100 UNIT/ML SOLOSTAR PEN: 50.0000 [IU] | PEN_INJECTOR | Freq: Every day | SUBCUTANEOUS | 2 refills | Status: DC

## 2018-08-27 NOTE — Patient Instructions (Signed)

## 2018-08-27 NOTE — Progress Notes (Signed)
Subjective:    Patient ID: [name], male    DOB: November 06, 1996. Patient is being seen in f/u for management of diabetes requested by  Lucas Anchors, FNP  Past Medical History:  Diagnosis Date  . Diabetes mellitus, type II ([place])   . Heart murmur   . Hypertension    Past Surgical History:  Procedure Laterality Date  . PALATE / UVULA BIOPSY / EXCISION     "growth removed"   Social History   Socioeconomic History  . Marital status: Single    Spouse name: Not on file  . Number of children: Not on file  . Years of education: Not on file  . Highest education level: Not on file  Occupational History  . Not on file  Social Needs  . Financial resource strain: Not on file  . Food insecurity:    Worry: Not on file    Inability: Not on file  . Transportation needs:    Medical: Not on file    Non-medical: Not on file  Tobacco Use  . Smoking status: Passive Smoke Exposure - Never Smoker  . Smokeless tobacco: Never Used  Substance and Sexual Activity  . Alcohol use: No  . Drug use: No  . Sexual activity: Not on file  Lifestyle  . Physical activity:    Days per week: Not on file    Minutes per session: Not on file  . Stress: Not on file  Relationships  . Social connections:    Talks on phone: Not on file    Gets together: Not on file    Attends religious service: Not on file    Active member of club or organization: Not on file    Attends meetings of clubs or organizations: Not on file    Relationship status: Not on file  Other Topics Concern  . Not on file  Social History Narrative  . Not on file   Outpatient Encounter Medications as of 08/27/2018  Medication Sig  . glucose blood (ACCU-CHEK AVIVA) test strip Use as instructed 4 x daily. E11.65  . hydrochlorothiazide (HYDRODIURIL) 25 MG tablet Take 1 tablet (25 mg total) by mouth daily.  . Insulin Glargine (LANTUS SOLOSTAR) 100 UNIT/ML Solostar Pen Inject 50 Units into the skin at bedtime.  . Insulin Pen  Needle (BD PEN NEEDLE NANO U/F) 32G X 4 MM MISC 1 each by Does not apply route 4 (four) times daily.  Elmore Guise Devices (ACCU-CHEK SOFTCLIX) lancets Use as instructed 4 x daily. e11.65 (Patient not taking: Reported on 08/26/2018)  . Lancets (ACCU-CHEK SOFT TOUCH) lancets Use as instructed  . metFORMIN (GLUCOPHAGE) 500 MG tablet Take by mouth 2 (two) times daily with a meal.  . [DISCONTINUED] Insulin Glargine (LANTUS SOLOSTAR) 100 UNIT/ML Solostar Pen Inject 40 Units into the skin daily at 10 pm. (Patient taking differently: Inject 42 Units into the skin daily at 10 pm. )   No facility-administered encounter medications on file as of 08/27/2018.    ALLERGIES: Allergies  Allergen Reactions  . Amoxicillin Hives    Has patient had a PCN reaction causing immediate rash, facial/tongue/throat swelling, SOB or lightheadedness with hypotension: No Has patient had a PCN reaction causing severe rash involving mucus membranes or skin necrosis: No Has patient had a PCN reaction that required hospitalization: No Has patient had a PCN reaction occurring within the last 10 years: No If all of the above answers are "NO", then may proceed with Cephalosporin use.  VACCINATION STATUS:  There is no immunization history on file for this patient.  Diabetes  He presents for his follow-up diabetic visit. He has type 2 diabetes mellitus. Onset time: He was diagnosed at approximate age of 71 years. His disease course has been worsening. There are no hypoglycemic associated symptoms. Pertinent negatives for hypoglycemia include no confusion, headaches, pallor or seizures. Associated symptoms include blurred vision, polydipsia and polyuria. Pertinent negatives for diabetes include no chest pain, no fatigue, no polyphagia and no weakness. There are no hypoglycemic complications. Symptoms are worsening. There are no diabetic complications. (Noncompliance/nonadherence.) Risk factors for coronary artery disease include diabetes  mellitus, male sex and sedentary lifestyle. His weight is fluctuating minimally. He is following a generally unhealthy diet. When asked about meal planning, he reported none. He has not had a previous visit with a dietitian. He rarely participates in exercise. His overall blood glucose range is 180-200 mg/dl. (He missed his appointment since February 2018.  More recently he had visited ER on more than one occasion complaining of shortness of breath, and was advised to resume follow-up with me.  His recent A1c is 12.5% largely unchanged from 13% A1c during his last visit.  He admits that he has restarted his insulin after the ER visit.  His average blood glucose over the last 14 days is 199. ) An ACE inhibitor/angiotensin II receptor blocker is not being taken. He does not see a podiatrist.Eye exam is not current.    Review of Systems  Constitutional: Positive for unexpected weight change. Negative for chills, fatigue and fever.        He has regained 12 pounds over the last 2 weeks after he lost approximately 20 pounds over the last year.  HENT: Negative for dental problem, mouth sores and trouble swallowing.   Eyes: Positive for blurred vision. Negative for visual disturbance.  Respiratory: Negative for cough, choking, chest tightness, shortness of breath and wheezing.   Cardiovascular: Negative for chest pain, palpitations and leg swelling.  Gastrointestinal: Negative for abdominal distention, abdominal pain, constipation, diarrhea, nausea and vomiting.  Endocrine: Positive for polydipsia and polyuria. Negative for polyphagia.  Genitourinary: Negative for dysuria, flank pain, hematuria and urgency.  Musculoskeletal: Negative for back pain, gait problem, myalgias and neck pain.  Skin: Negative for pallor, rash and wound.  Neurological: Negative for seizures, syncope, weakness, numbness and headaches.  Psychiatric/Behavioral: Negative.  Negative for confusion and dysphoric mood.    Objective:     BP 135/86   Pulse 90   Ht 6\' 2"  (1.88 m)   Wt 189 lb (85.7 kg)   BMI 24.27 kg/m   Wt Readings from Last 3 Encounters:  08/27/18 189 lb (85.7 kg)  08/26/18 184 lb 15.5 oz (83.9 kg)  08/10/18 185 lb (83.9 kg)    Physical Exam  Constitutional: He is oriented to person, place, and time. He appears well-developed and well-nourished. He is cooperative. No distress.  HENT:  Head: Normocephalic and atraumatic.  Eyes: EOM are normal.  Neck: Normal range of motion. Neck supple. No tracheal deviation present. No thyromegaly present.  Cardiovascular: Normal rate, S1 normal, S2 normal and normal heart sounds. Exam reveals no gallop.  No murmur heard. Pulses:      Dorsalis pedis pulses are 1+ on the right side, and 1+ on the left side.       Posterior tibial pulses are 1+ on the right side, and 1+ on the left side.  Pulmonary/Chest: Breath sounds normal. No respiratory distress. He  has no wheezes.  Abdominal: Soft. Bowel sounds are normal. He exhibits no distension. There is no tenderness. There is no guarding and no CVA tenderness.  Musculoskeletal: He exhibits no edema.       Right shoulder: He exhibits no swelling and no deformity.  Neurological: He is alert and oriented to person, place, and time. He has normal strength and normal reflexes. No cranial nerve deficit or sensory deficit. Gait normal.  Skin: Skin is warm and dry. No rash noted. No cyanosis. Nails show no clubbing.  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  Psychiatric: He has a normal mood and affect. His speech is normal. Cognition and memory are normal.  Unconcerned affect, reluctant affect.   CMP Latest Ref Rng & Units 08/26/2018 08/14/2018 08/10/2018  Glucose 70 - 99 mg/dL 288(H) - 392(H)  BUN 6 - 20 mg/dL 12 17 11   Creatinine 0.61 - 1.24 mg/dL 0.66 0.7 0.55(L)  Sodium 135 - 145 mmol/L 135 - 133(L)  Potassium 3.5 - 5.1 mmol/L 3.5 - 3.6  Chloride 98 - 111 mmol/L 100 - 101  CO2 22 - 32  mmol/L 26 - 23  Calcium 8.9 - 10.3 mg/dL 9.8 - 8.9  Total Protein 6.5 - 8.1 g/dL 8.6(H) - 7.3  Total Bilirubin 0.3 - 1.2 mg/dL 1.2 - 0.7  Alkaline Phos 38 - 126 U/L 54 - 53  AST 15 - 41 U/L 30 - 18  ALT 0 - 44 U/L 35 - 15   August 14, 2018 A1c 12.5%.  Assessment & Plan:   1. Uncontrolled type  ? diabetes mellitus with complication, unspecified long term insulin use status ([place]) -This patient's care is complicated by noncompliance/nonadherence.  He no showed since February 2018.  In the interim, he was initiated on metformin 500 mg p.o. twice daily by another provider.  He admits that he has not taken his basal insulin until he was advised to do so during his ER visit for shortness of breath several weeks ago. - He is not very sure on the type of diabetes he has, denies any history of diabetic ketoacidosis, suggesting likely type 2 diabetes.  - Patient has currently uncontrolled symptomatic type ? DM since 22  years of age ,  with most recent A1c of 12.5% unchanged from 13 %, during his last visit.  Recent labs reviewed.   He still does not report any gross complications from his diabetes, however he remains at extremely high risk for  more acute and chronic complications of diabetes which include CAD, CVA, CKD, retinopathy, and neuropathy. These are all discussed in detail with the patient.  - I have counseled the patient on diet management and  by adopting a carbohydrate restricted/protein rich diet, the presence of his mother in the room. - He does not have excessive weight to lose at this time. -  Suggestion is made for him to avoid simple carbohydrates  from his diet including Cakes, Sweet Desserts / Pastries, Ice Cream, Soda (diet and regular), Sweet Tea, Candies, Chips, Cookies, Store Bought Juices, Alcohol in Excess of  1-2 drinks a day, Artificial Sweeteners, and "Sugar-free" Products. This will help patient to have stable blood glucose profile and potentially avoid unintended weight  gain.   - I encouraged the patient to switch to  unprocessed or minimally processed complex starch and increased protein intake (animal or plant source), fruits, and vegetables.  - Patient is advised to stick to a routine mealtimes to eat 3 meals  a day and avoid unnecessary snacks ( to snack only to correct hypoglycemia).  -Missed his appointment with his dietitian, he will be rescheduled with Jearld Fenton, RDN, CDE for individualized DM education.  - I have approached patient with the following individualized plan to manage diabetes and patient agrees:   -He reluctantly accepts to start monitoring blood glucose at least 2 times a day-daily before breakfast and at bedtime. -He is encouraged to call clinic for blood glucose readings less than 70 or greater than 2003 readings. -In the meantime, I advised him to increase his Lantus to 50 units nightly (there is evidence that he has been benefiting from his basal insulin). -He is very likely to require intensive treatment with basal/bolus insulin in order for him to achieve and maintain control of diabetes to target.  For this to happen, his commitment for proper monitoring should be assured first.  -He may benefit from low-dose metformin treatment.  I advised him to continue metformin 500 mg p.o. twice daily.  - He is not a candidate for incretin, nor SGLT2 inhibitors therapy . - Patient specific target  A1c;  LDL, HDL, Triglycerides, and  Waist Circumference were discussed in detail.  2) BP/HTN: His blood pressure is controlled to target.  He has benefited from her hydrochlorothiazide treatment.  I advised him to continue hydrochlorothiazide 25 mg p.o. daily.    3) Lipids/HPL: No recent lipid panel, patient is not on statins.   4)  Weight/Diet:  He has currently appropriate weight, CDE Consult will be reinitiated , exercise, and detailed carbohydrates information provided.  5) Chronic Care/Health Maintenance:  -Patient is not  on  ACEI/ARB and Statin medications and encouraged to continue to follow up with Ophthalmology, cardiology, podiatrist at least yearly or according to recommendations, and advised to   stay away from smoking. I have recommended yearly flu vaccine and pneumonia vaccination at least every 5 years; moderate intensity exercise for up to 150 minutes weekly; and  sleep for at least 7 hours a day. - I advised patient to maintain close follow up with Lucas Anchors, FNP for primary care needs.  - Time spent with the patient: 25 min, of which >50% was spent in reviewing his blood glucose logs , discussing his hypo- and hyper-glycemic episodes, reviewing his current and  previous labs and insulin doses and developing a plan to avoid hypo- and hyper-glycemia. Please refer to Patient Instructions for Blood Glucose Monitoring and Insulin/Medications Dosing Guide"  in media tab for additional information. [name] participated in the discussions, expressed understanding, and voiced agreement with the above plans.  All questions were answered to his satisfaction. he is encouraged to contact clinic should he have any questions or concerns prior to his return visit.  Follow up plan: - Return in about 3 months (around 11/26/2018) for Meter, and Logs, Follow up with Pre-visit Labs, Meter, and Logs.  Glade Lloyd, MD Phone: [phone]  Fax: [phone]   08/27/2018, 5:09 PM

## 2018-08-28 ENCOUNTER — Ambulatory Visit: Payer: Medicaid Other | Attending: Family Medicine | Admitting: Neurology

## 2018-08-28 DIAGNOSIS — G4730 Sleep apnea, unspecified: Secondary | ICD-10-CM

## 2018-08-28 DIAGNOSIS — Z794 Long term (current) use of insulin: Secondary | ICD-10-CM | POA: Insufficient documentation

## 2018-08-28 DIAGNOSIS — Z79899 Other long term (current) drug therapy: Secondary | ICD-10-CM | POA: Insufficient documentation

## 2018-08-28 DIAGNOSIS — R0683 Snoring: Secondary | ICD-10-CM | POA: Diagnosis present

## 2018-09-04 NOTE — Procedures (Signed)
[place] A. Merlene Laughter, MD     [email or web address]             NOCTURNAL POLYSOMNOGRAPHY   LOCATION: [facility]  Patient Name: [name], [name] Date: 08/28/2018 Gender: Male D.O.B: [birth year] Age (years): 82 Referring Provider: [place] MD Height (inches): 74 Interpreting Physician: Phillips Odor MD, ABSM Weight (lbs): 191 RPSGT: Peak, Robert BMI: 25 MRN: [id number] Neck Size: 16.00 <br> <br> CLINICAL INFORMATION Sleep Study Type: NPSG    Indication for sleep study: Snoring    Epworth Sleepiness Score:    SLEEP STUDY TECHNIQUE As per the AASM Manual for the Scoring of Sleep and Associated Events v2.3 (April 2016) with a hypopnea requiring 4% desaturations.  The channels recorded and monitored were frontal, central and occipital EEG, electrooculogram (EOG), submentalis EMG (chin), nasal and oral airflow, thoracic and abdominal wall motion, anterior tibialis EMG, snore microphone, electrocardiogram, and pulse oximetry.  MEDICATIONS Medications self-administered by patient taken the night of the study : N/A  Current Outpatient Medications:  .  glucose blood (ACCU-CHEK AVIVA) test strip, Use as instructed 4 x daily. E11.65, Disp: 150 each, Rfl: 5 .  hydrochlorothiazide (HYDRODIURIL) 25 MG tablet, Take 1 tablet (25 mg total) by mouth daily., Disp: 90 tablet, Rfl: 0 .  Insulin Glargine (LANTUS SOLOSTAR) 100 UNIT/ML Solostar Pen, Inject 50 Units into the skin at bedtime., Disp: 5 pen, Rfl: 2 .  Insulin Pen Needle (BD PEN NEEDLE NANO U/F) 32G X 4 MM MISC, 1 each by Does not apply route 4 (four) times daily., Disp: 150 each, Rfl: 3 .  Lancet Devices (ACCU-CHEK SOFTCLIX) lancets, Use as instructed 4 x daily. e11.65 (Patient not taking: Reported on 08/26/2018), Disp: 150 each, Rfl: 5 .  Lancets (ACCU-CHEK SOFT TOUCH) lancets, Use as instructed, Disp: 100 each, Rfl: 12 .  metFORMIN (GLUCOPHAGE) 500 MG tablet, Take by mouth 2 (two) times  daily with a meal., Disp: , Rfl:     SLEEP ARCHITECTURE The study was initiated at 10:03:37 PM and ended at 4:43:36 AM.  Sleep onset time was 108.5 minutes and the sleep efficiency was 17.5%%. The total sleep time was 70 minutes.  Stage REM latency was 213.5 minutes.  The patient spent 12.9%% of the night in stage N1 sleep, 62.9%% in stage N2 sleep, 22.1%% in stage N3 and 2.1% in REM.  Alpha intrusion was absent.  Supine sleep was 0.00%.  RESPIRATORY PARAMETERS The overall apnea/hypopnea index (AHI) was 4.3 per hour. There were 4 total apneas, including 0 obstructive, 4 central and 0 mixed apneas. There were 1 hypopneas and 7 RERAs.  The AHI during Stage REM sleep was 40.0 per hour.  AHI while supine was N/A per hour.  The mean oxygen saturation was 96.6%. The minimum SpO2 during sleep was 92.0%.  snoring was noted during this study.  CARDIAC DATA The 2 lead EKG demonstrated sinus rhythm. The mean heart rate was 85.9 beats per minute. Other EKG findings include: None. LEG MOVEMENT DATA The total PLMS were 0 with a resulting PLMS index of 0.0. Associated arousal with leg movement index was 0.0.  IMPRESSIONS - No significant obstructive sleep apnea occurred during this study. - No significant central sleep apnea occurred during this study. - The study was characterized by very poor sleep efficiency.This may serve to underestimate the degree of apneic events. Consider repeat home testing in the future if the patient remains symptomatic.    Lucas Metz, MD Diplomate, American Board of Sleep Medicine.  ELECTRONICALLY  SIGNED ON:  09/04/2018, 5:15 PM [place] PH: [phone]   FX: [phone] [place]

## 2018-09-08 ENCOUNTER — Ambulatory Visit: Payer: Medicaid Other | Admitting: Cardiovascular Disease

## 2018-09-08 ENCOUNTER — Encounter: Payer: Self-pay | Admitting: Cardiovascular Disease

## 2018-09-08 VITALS — BP 137/91 | HR 102 | Ht 74.0 in | Wt 191.8 lb

## 2018-09-08 DIAGNOSIS — E119 Type 2 diabetes mellitus without complications: Secondary | ICD-10-CM

## 2018-09-08 DIAGNOSIS — R002 Palpitations: Secondary | ICD-10-CM | POA: Diagnosis not present

## 2018-09-08 DIAGNOSIS — I10 Essential (primary) hypertension: Secondary | ICD-10-CM

## 2018-09-08 DIAGNOSIS — Z9289 Personal history of other medical treatment: Secondary | ICD-10-CM

## 2018-09-08 DIAGNOSIS — R000 Tachycardia, unspecified: Secondary | ICD-10-CM | POA: Diagnosis not present

## 2018-09-08 DIAGNOSIS — IMO0001 Reserved for inherently not codable concepts without codable children: Secondary | ICD-10-CM

## 2018-09-08 DIAGNOSIS — Z794 Long term (current) use of insulin: Secondary | ICD-10-CM

## 2018-09-08 MED ORDER — CARVEDILOL 3.125 MG PO TABS: 3.1250 mg | ORAL_TABLET | Freq: Two times a day (BID) | ORAL | 6 refills | Status: DC

## 2018-09-08 NOTE — Patient Instructions (Signed)
Medication Instructions:   Stop HCTZ.   Begin Coreg 3.125mg  twice a day.  Continue all other medications.    Labwork: none  Testing/Procedures: none  Follow-Up: 4 months   Any Other Special Instructions Will Be Listed Below (If Applicable).  If you need a refill on your cardiac medications before your next appointment, please call your pharmacy.

## 2018-09-08 NOTE — Progress Notes (Signed)
CARDIOLOGY CONSULT NOTE  Patient ID: [name] MRN: [id number] DOB/AGE: [birth year] 22 y.o.  Admit date: (Not on file) Primary Physician: [name], [place] Referring Physician: [name], [place]  Reason for Consultation: Palpitations  HPI: [name] is a 22 y.o. male who is being seen today for the evaluation of palpitations at the request of [name], [place].  Past medical history includes insulin-dependent diabetes mellitus and hypertension.  He was recently evaluated in the emergency department at [facility] on 08/26/2018.  I personally reviewed all relevant duct mentation, labs, and studies.  Vital signs showed tachycardia, heart rate 121 bpm.  He was normotensive with normal oxygen saturations.  It appears a limited echocardiogram was performed which did not demonstrate any gross abnormalities.  There is reported history of noncompliance with medical therapy.  He was given Ativan and IV fluids.  Magnesium was normal.  Comprehensive metabolic panel was normal other than hyperglycemia and elevated total protein of 8.6. CBC showed leukocytosis, white blood cells 13.2.  D-dimer, troponin, TSH, and urine drug screen were all normal.  Chest x-ray showed no active cardiopulmonary disease.  I personally reviewed the ECG which demonstrated sinus tachycardia, 113 bpm.  He was previously evaluated in the ED for palpitations on 08/10/2018.  Heart rate was 123 bpm by ECG.  He said his heart always races but sometimes he feels it moreso than others.  His mother describes his whole body shaking.  Blood sugar this morning was 106.  He has occasional bilateral chest soreness.  He denies lightheadedness, dizziness, and syncope.   Allergies  Allergen Reactions  . Amoxicillin Hives    Has patient had a PCN reaction causing immediate rash, facial/tongue/throat swelling, SOB or lightheadedness with hypotension: No Has patient had a PCN reaction causing  severe rash involving mucus membranes or skin necrosis: No Has patient had a PCN reaction that required hospitalization: No Has patient had a PCN reaction occurring within the last 10 years: No If all of the above answers are "NO", then may proceed with Cephalosporin use.     Current Outpatient Medications  Medication Sig Dispense Refill  . glucose blood (ACCU-CHEK AVIVA) test strip Use as instructed 4 x daily. E11.65 150 each 5  . hydrochlorothiazide (HYDRODIURIL) 25 MG tablet Take 1 tablet (25 mg total) by mouth daily. 90 tablet 0  . Insulin Glargine (LANTUS SOLOSTAR) 100 UNIT/ML Solostar Pen Inject 50 Units into the skin at bedtime. 5 pen 2  . Insulin Pen Needle (BD PEN NEEDLE NANO U/F) 32G X 4 MM MISC 1 each by Does not apply route 4 (four) times daily. 150 each 3  . Lancet Devices (ACCU-CHEK SOFTCLIX) lancets Use as instructed 4 x daily. e11.65 150 each 5  . Lancets (ACCU-CHEK SOFT TOUCH) lancets Use as instructed 100 each 12  . metFORMIN (GLUCOPHAGE) 500 MG tablet Take by mouth 2 (two) times daily with a meal.     No current facility-administered medications for this visit.     Past Medical History:  Diagnosis Date  . Diabetes mellitus, type II ([place])   . Heart murmur   . Hypertension     Past Surgical History:  Procedure Laterality Date  . PALATE / UVULA BIOPSY / EXCISION     "growth removed"    Social History   Socioeconomic History  . Marital status: Single    Spouse name: Not on file  . Number of children: Not on file  . Years  of education: Not on file  . Highest education level: Not on file  Occupational History  . Not on file  Social Needs  . Financial resource strain: Not on file  . Food insecurity:    Worry: Not on file    Inability: Not on file  . Transportation needs:    Medical: Not on file    Non-medical: Not on file  Tobacco Use  . Smoking status: Passive Smoke Exposure - Never Smoker  . Smokeless tobacco: Never Used  Substance and Sexual  Activity  . Alcohol use: No  . Drug use: No  . Sexual activity: Not on file  Lifestyle  . Physical activity:    Days per week: Not on file    Minutes per session: Not on file  . Stress: Not on file  Relationships  . Social connections:    Talks on phone: Not on file    Gets together: Not on file    Attends religious service: Not on file    Active member of club or organization: Not on file    Attends meetings of clubs or organizations: Not on file    Relationship status: Not on file  . Intimate partner violence:    Fear of current or ex partner: Not on file    Emotionally abused: Not on file    Physically abused: Not on file    Forced sexual activity: Not on file  Other Topics Concern  . Not on file  Social History Narrative  . Not on file     No family history of premature CAD in 1st degree relatives.  Current Meds  Medication Sig  . glucose blood (ACCU-CHEK AVIVA) test strip Use as instructed 4 x daily. E11.65  . hydrochlorothiazide (HYDRODIURIL) 25 MG tablet Take 1 tablet (25 mg total) by mouth daily.  . Insulin Glargine (LANTUS SOLOSTAR) 100 UNIT/ML Solostar Pen Inject 50 Units into the skin at bedtime.  . Insulin Pen Needle (BD PEN NEEDLE NANO U/F) 32G X 4 MM MISC 1 each by Does not apply route 4 (four) times daily.  Elmore Guise Devices (ACCU-CHEK SOFTCLIX) lancets Use as instructed 4 x daily. e11.65  . Lancets (ACCU-CHEK SOFT TOUCH) lancets Use as instructed  . metFORMIN (GLUCOPHAGE) 500 MG tablet Take by mouth 2 (two) times daily with a meal.      Review of systems complete and found to be negative unless listed above in HPI    Physical exam Blood pressure (!) 137/91, pulse (!) 102, height 6\' 2"  (1.88 m), weight 191 lb 12.8 oz (87 kg), SpO2 100 %. General: NAD Neck: No JVD, no thyromegaly or thyroid nodule.  Lungs: Clear to auscultation bilaterally with normal respiratory effort. CV: Nondisplaced PMI.  Tachycardic, regular rhythm, normal S1/S2, no S3/S4, no  murmur.  No peripheral edema.   Abdomen: Soft, nontender, no distention.  Skin: Intact without lesions or rashes.  Neurologic: Alert and oriented x 3.  Psych: Normal affect. Extremities: No clubbing or cyanosis.  HEENT: Normal.   ECG: Most recent ECG reviewed.   Labs: Lab Results  Component Value Date/Time   K 3.5 08/26/2018 02:04 PM   BUN 12 08/26/2018 02:04 PM   BUN 17 08/14/2018   CREATININE 0.66 08/26/2018 02:04 PM   CREATININE 0.66 03/27/2017 11:49 AM   ALT 35 08/26/2018 02:04 PM   TSH 0.517 08/26/2018 02:12 PM   HGB 15.7 08/26/2018 02:04 PM     Lipids: No results found for: LDLCALC, LDLDIRECT, CHOL, TRIG,  HDL      ASSESSMENT AND PLAN:  1.  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  He also has hypertension and has been on hydrochlorothiazide for the past month.  I will discontinue this and start carvedilol 3.125 mg twice daily for both heart rate and blood pressure control.  2.  Hypertension: Please see discussion in #1.  I am stopping hydrochlorothiazide and starting carvedilol 3.125 mg twice daily.  3.  Insulin-dependent diabetes mellitus: He is on insulin and metformin.  He is followed by endocrinology.   Disposition: Follow up in 4 months  Signed: Kate Sable, M.D., F.A.C.C.  09/08/2018, 9:32 AM

## 2018-09-08 NOTE — Addendum Note (Signed)
Addended by: Laurine Blazer on: 09/08/2018 10:04 AM   Modules accepted: Orders

## 2018-09-11 LAB — GLUCOSE, POCT (MANUAL RESULT ENTRY): POC GLUCOSE: 272 mg/dL — AB (ref 70–99)

## 2018-11-19 ENCOUNTER — Emergency Department (HOSPITAL_COMMUNITY): Payer: Medicaid Other

## 2018-11-19 ENCOUNTER — Encounter (HOSPITAL_COMMUNITY): Payer: Self-pay | Admitting: Emergency Medicine

## 2018-11-19 ENCOUNTER — Emergency Department (HOSPITAL_COMMUNITY)
Admission: EM | Admit: 2018-11-19 | Discharge: 2018-11-19 | Disposition: A | Discharge status: Home / Self Care | Payer: Medicaid Other | Attending: Emergency Medicine | Admitting: Emergency Medicine

## 2018-11-19 ENCOUNTER — Other Ambulatory Visit: Payer: Self-pay

## 2018-11-19 DIAGNOSIS — R079 Chest pain, unspecified: Secondary | ICD-10-CM | POA: Insufficient documentation

## 2018-11-19 DIAGNOSIS — E119 Type 2 diabetes mellitus without complications: Secondary | ICD-10-CM | POA: Insufficient documentation

## 2018-11-19 DIAGNOSIS — Z7722 Contact with and (suspected) exposure to environmental tobacco smoke (acute) (chronic): Secondary | ICD-10-CM | POA: Diagnosis not present

## 2018-11-19 DIAGNOSIS — I10 Essential (primary) hypertension: Secondary | ICD-10-CM | POA: Diagnosis not present

## 2018-11-19 LAB — COMPLETE METABOLIC PANEL WITH GFR
AG Ratio: 1.4 (calc) (ref 1.0–2.5)
ALBUMIN MSPROF: 4.6 g/dL (ref 3.6–5.1)
ALT: 17 U/L (ref 9–46)
AST: 16 U/L (ref 10–40)
Alkaline phosphatase (APISO): 48 U/L (ref 40–115)
BUN: 11 mg/dL (ref 7–25)
CALCIUM: 10.2 mg/dL (ref 8.6–10.3)
CHLORIDE: 101 mmol/L (ref 98–110)
CO2: 31 mmol/L (ref 20–32)
CREATININE: 0.78 mg/dL (ref 0.60–1.35)
GFR, Est African American: 151 mL/min/{1.73_m2} (ref >=60)
GFR, Est Non African American: 130 mL/min/{1.73_m2} (ref >=60)
GLUCOSE: 159 mg/dL — AB (ref 65–99)
Globulin: 3.3 g/dL (calc) (ref 1.9–3.7)
Potassium: 4.4 mmol/L (ref 3.5–5.3)
SODIUM: 139 mmol/L (ref 135–146)
TOTAL PROTEIN: 7.9 g/dL (ref 6.1–8.1)
Total Bilirubin: 0.5 mg/dL (ref 0.2–1.2)

## 2018-11-19 LAB — CBG MONITORING, ED: Glucose-Capillary: 190 mg/dL — ABNORMAL HIGH (ref 70–99)

## 2018-11-19 LAB — CBC
HEMATOCRIT: 48.9 % (ref 39.0–52.0)
HEMOGLOBIN: 16.1 g/dL (ref 13.0–17.0)
MCH: 28 pg (ref 26.0–34.0)
MCHC: 32.9 g/dL (ref 30.0–36.0)
MCV: 85.2 fL (ref 80.0–100.0)
Platelets: 330 10*3/uL (ref 150–400)
RBC: 5.74 MIL/uL (ref 4.22–5.81)
RDW: 12.1 % (ref 11.5–15.5)
WBC: 12.1 10*3/uL — AB (ref 4.0–10.5)
nRBC: 0 % (ref 0.0–0.2)

## 2018-11-19 LAB — COMPREHENSIVE METABOLIC PANEL
ALBUMIN: 4.7 g/dL (ref 3.5–5.0)
ALK PHOS: 47 U/L (ref 38–126)
ALT: 20 U/L (ref 0–44)
ANION GAP: 10 (ref 5–15)
AST: 18 U/L (ref 15–41)
BILIRUBIN TOTAL: 0.8 mg/dL (ref 0.3–1.2)
BUN: 12 mg/dL (ref 6–20)
CALCIUM: 9.9 mg/dL (ref 8.9–10.3)
CO2: 27 mmol/L (ref 22–32)
CREATININE: 0.7 mg/dL (ref 0.61–1.24)
Chloride: 102 mmol/L (ref 98–111)
GFR calc Af Amer: >60 mL/min (ref >60)
GFR calc non Af Amer: >60 mL/min (ref >60)
Glucose, Bld: 189 mg/dL — ABNORMAL HIGH (ref 70–99)
Potassium: 3.6 mmol/L (ref 3.5–5.1)
Sodium: 139 mmol/L (ref 135–145)
TOTAL PROTEIN: 8.6 g/dL — AB (ref 6.5–8.1)

## 2018-11-19 LAB — I-STAT TROPONIN, ED: Troponin i, poc: 0 ng/mL (ref 0.00–0.08)

## 2018-11-19 LAB — T4, FREE: FREE T4: 1.5 ng/dL — AB (ref 0.8–1.4)

## 2018-11-19 LAB — LIPASE, BLOOD: Lipase: 24 U/L (ref 11–51)

## 2018-11-19 LAB — HEMOGLOBIN A1C
Hgb A1c MFr Bld: 8.6 % of total Hgb — ABNORMAL HIGH (ref <5.7)
Mean Plasma Glucose: 200 (calc)
eAG (mmol/L): 11.1 (calc)

## 2018-11-19 LAB — TSH: TSH: 1.02 mIU/L (ref 0.40–4.50)

## 2018-11-19 LAB — I-STAT CG4 LACTIC ACID, ED: Lactic Acid, Venous: 0.97 mmol/L (ref 0.5–1.9)

## 2018-11-19 LAB — VITAMIN D 25 HYDROXY (VIT D DEFICIENCY, FRACTURES): Vit D, 25-Hydroxy: 19 ng/mL — ABNORMAL LOW (ref 30–100)

## 2018-11-19 IMAGING — DX DG CHEST 2V
2 series · 2 of 2 positions shown · non-contrast
Comparison: [DATE]

CLINICAL DATA: Right-sided chest pain.

EXAM:
CHEST - 2 VIEW

[chest lat]
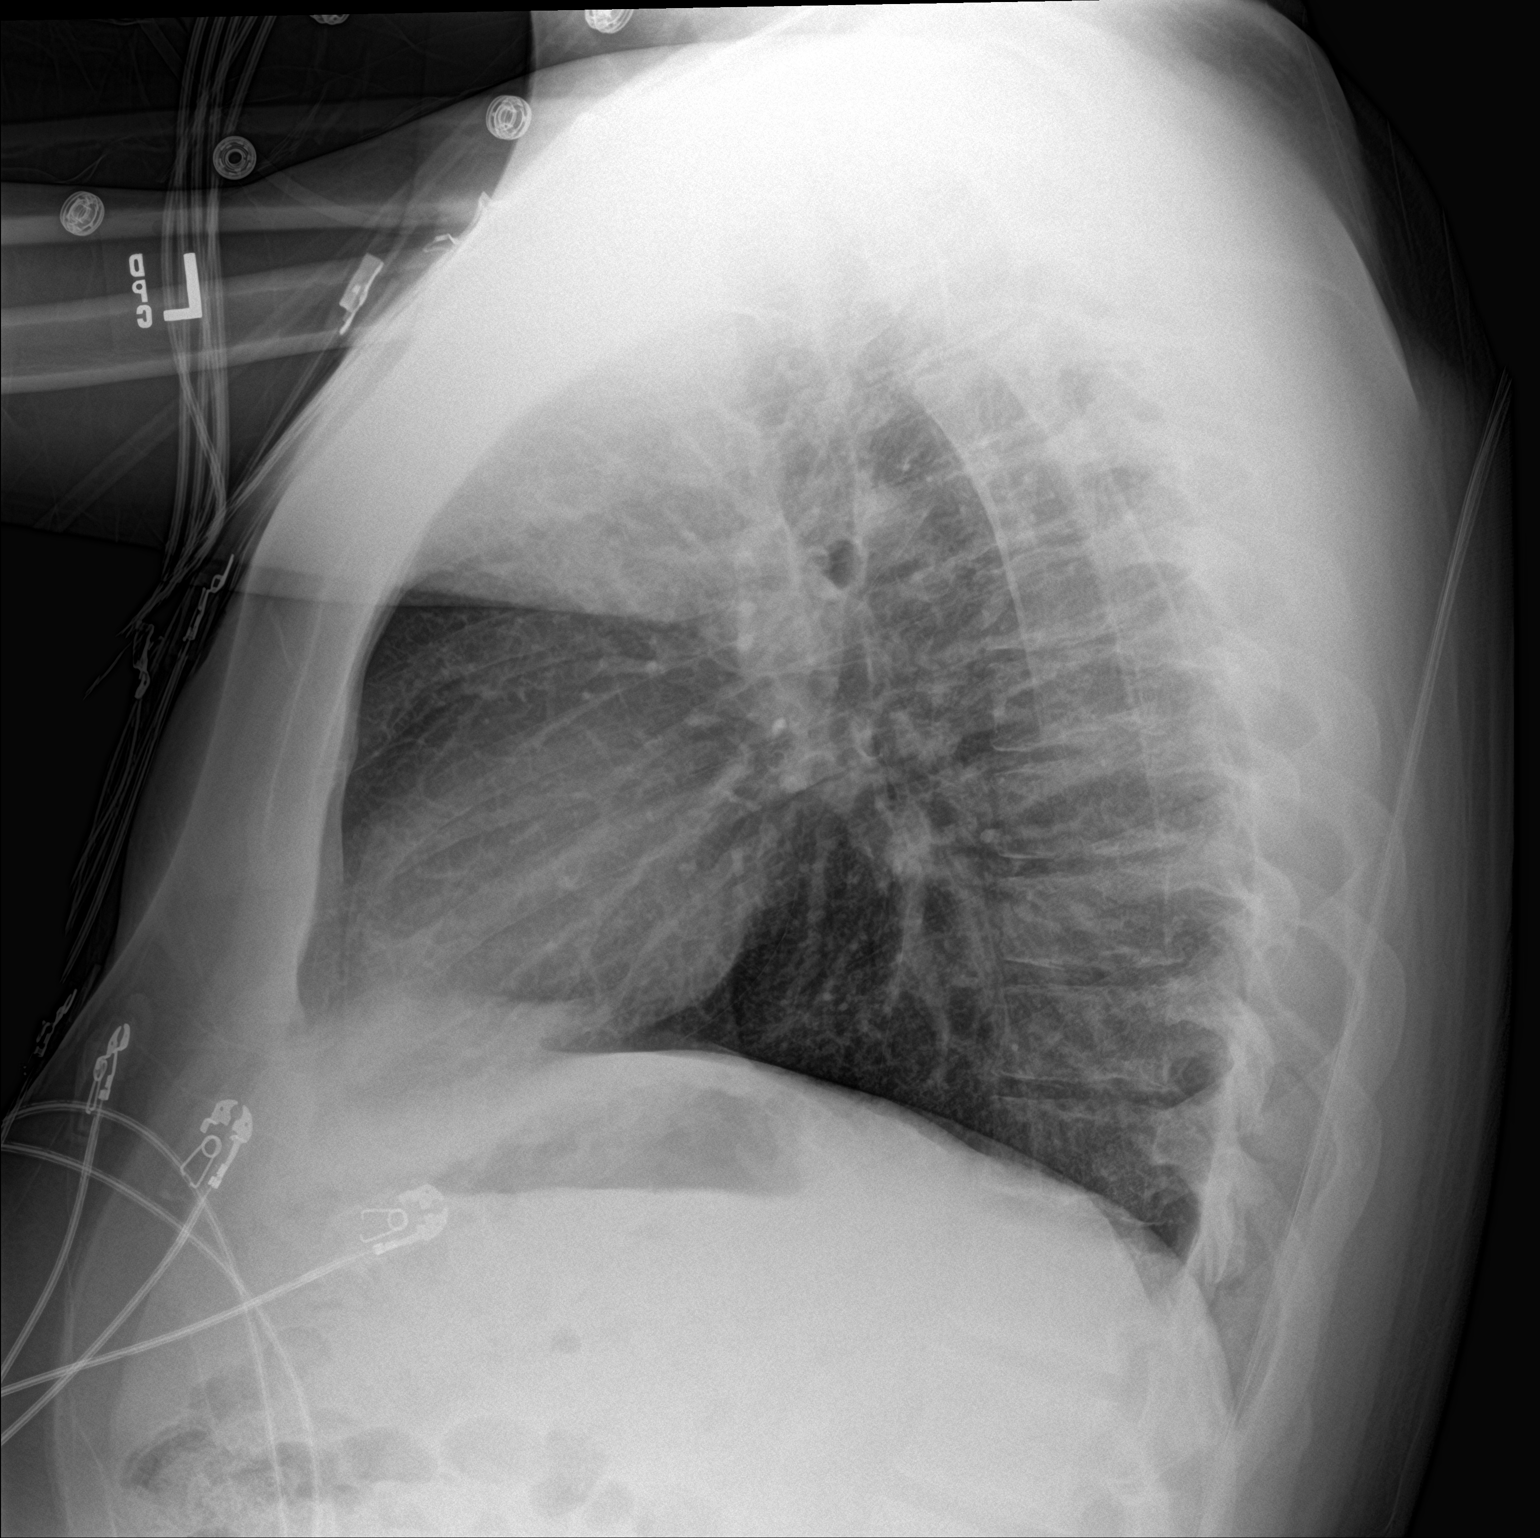

[chest ap]
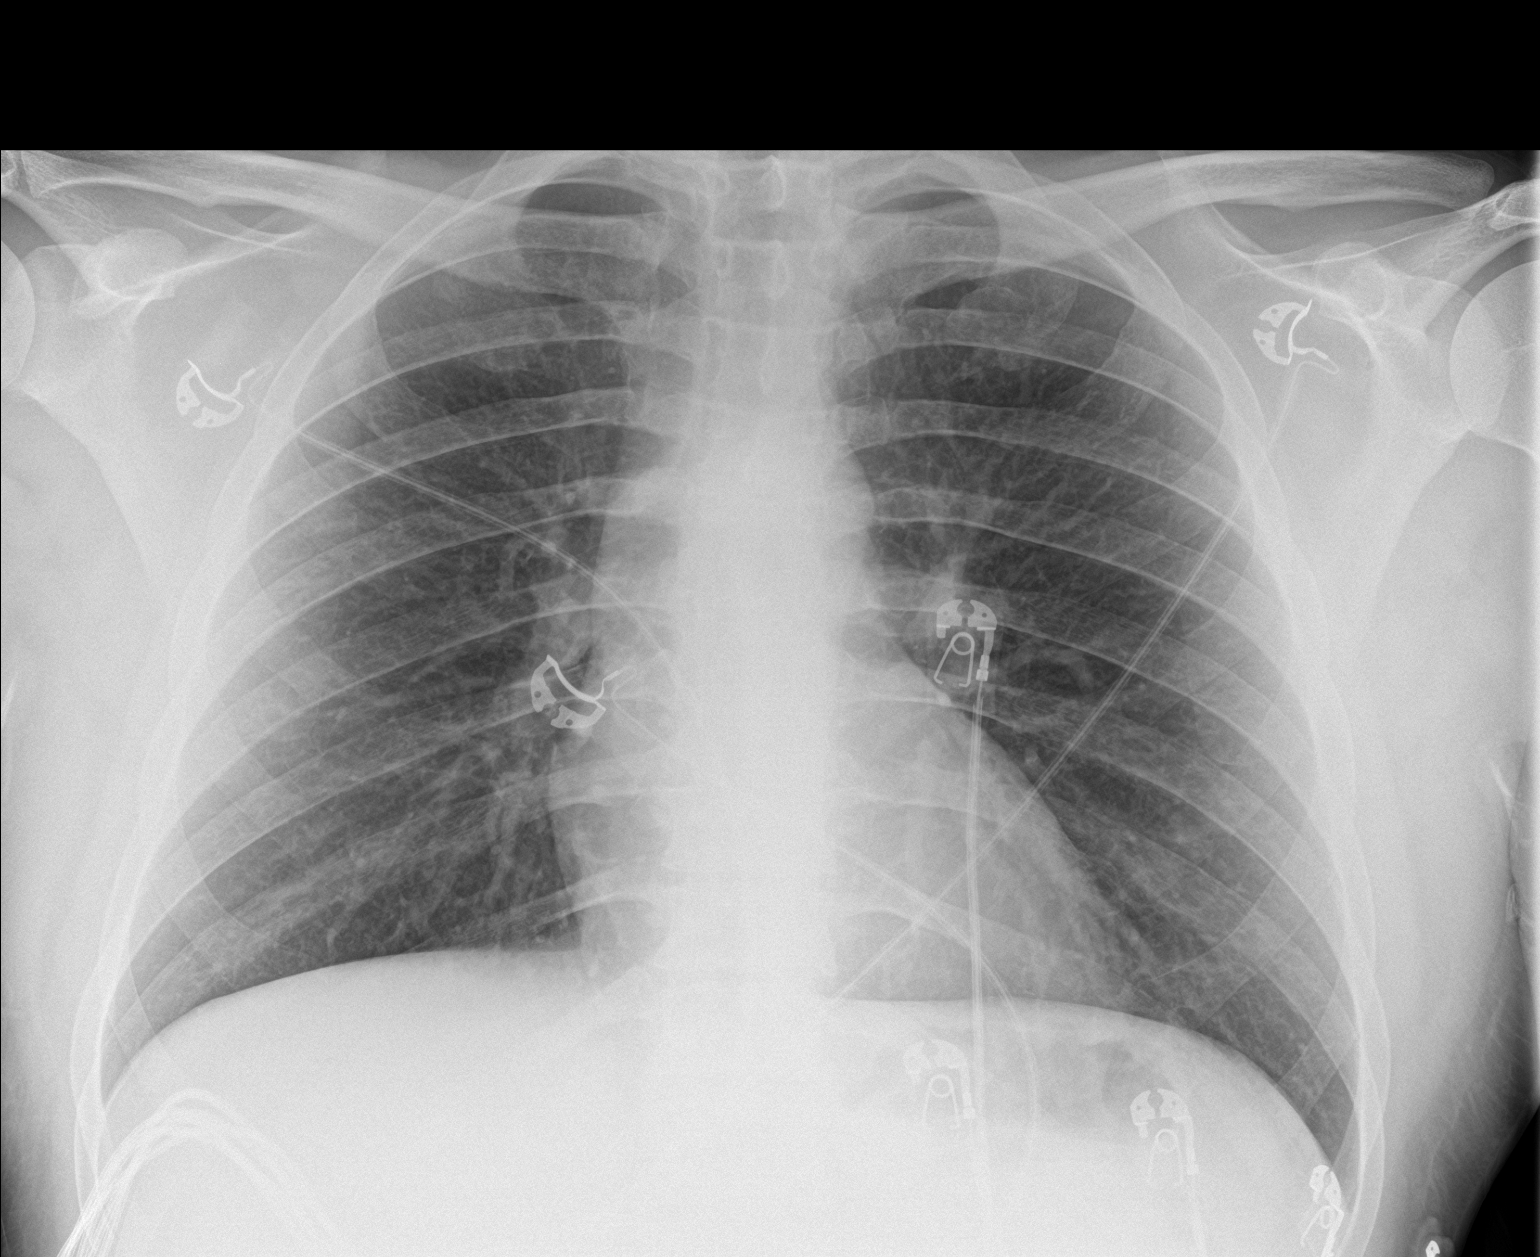

[2 of 2 positions shown; findings below may reference images not displayed]

FINDINGS: The heart size and mediastinal contours are within normal limits.
Both lungs are clear. The visualized skeletal structures are
unremarkable.
IMPRESSION: Negative.  No active cardiopulmonary disease.

## 2018-11-19 MED ORDER — SODIUM CHLORIDE 0.9 % IV BOLUS
1000.0000 mL | Freq: Once | INTRAVENOUS | Status: AC
  Administered 2018-11-19: 1000 mL via INTRAVENOUS

## 2018-11-19 MED ORDER — HYDROXYZINE HCL 25 MG PO TABS: 25.0000 mg | ORAL_TABLET | Freq: Four times a day (QID) | ORAL | 0 refills | Status: DC | PRN

## 2018-11-19 MED ORDER — LORAZEPAM 1 MG PO TABS
1.0000 mg | ORAL_TABLET | Freq: Once | ORAL | Status: AC
  Administered 2018-11-19: 1 mg via ORAL
  Filled 2018-11-19: qty 1

## 2018-11-19 NOTE — ED Notes (Signed)
Patient transported to X-ray 

## 2018-11-19 NOTE — ED Triage Notes (Signed)
Pt c/o right flank pain that radiates to the chest for a while. Pt was seen at urgent care yesterday for the same. Pt has been here in the past for the same.

## 2018-11-19 NOTE — ED Provider Notes (Signed)
[facility] Emergency Department Provider Note MRN:  [id number]  Arrival date & time: 11/19/18     Chief Complaint   Chest Pain   History of Present Illness   [name] is a 22 y.o. year-old male with a history of diabetes, hypertension presenting to the ED with chief complaint of chest pain.  Chest pain has been present for 2 to 3 months.  Occurs intermittently.  Has had to ED visits for the same pain.  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  Patient explains that the chest pain has become more frequent for the past several weeks.  The pain is located sometimes in the center of the chest, but seems to alternate with radiating to the right and left side of the chest at different times.  Also endorses intermittent shortness of breath, sometimes with the chest pain, sometimes without.  Denies headache or vision change, no dizziness or diaphoresis, no nausea or vomiting or diarrhea.  Also endorsing intermittent diffuse abdominal pain.  Denies fever or cough.  Review of Systems  A complete 10 system review of systems was obtained and all systems are negative except as noted in the HPI and PMH.   Patient's Health History    Past Medical History:  Diagnosis Date  . Diabetes mellitus, type II ([place])   . Heart murmur   . Hypertension     Past Surgical History:  Procedure Laterality Date  . 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 Diabetes Maternal Grandmother   . Thyroid disease Maternal Grandmother   . Hypertension Maternal Grandmother     Social History   Socioeconomic History  . Marital status: Single    Spouse name: Not on file  . Number of children: Not on file  . Years of education: Not on file  . Highest education level: Not on file  Occupational History  . Not on file  Social Needs  . Financial resource strain: Not on file  .  Food insecurity:    Worry: Not on file    Inability: Not on file  . Transportation needs:    Medical: Not on file    Non-medical: Not on file  Tobacco Use  . Smoking status: Passive Smoke Exposure - Never Smoker  . Smokeless tobacco: Never Used  Substance and Sexual Activity  . Alcohol use: No  . Drug use: No  . Sexual activity: Not on file  Lifestyle  . Physical activity:    Days per week: Not on file    Minutes per session: Not on file  . Stress: Not on file  Relationships  . Social connections:    Talks on phone: Not on file    Gets together: Not on file    Attends religious service: Not on file    Active member of club or organization: Not on file    Attends meetings of clubs or organizations: Not on file    Relationship status: Not on file  . Intimate partner violence:    Fear of current or ex partner: Not on file    Emotionally abused: Not on file    Physically abused: Not on file    Forced sexual activity: Not on file  Other Topics Concern  . Not on file  Social History Narrative  . Not on file     Physical  Exam  Vital Signs and Nursing Notes reviewed Vitals:   11/19/18 2125 11/19/18 2219  BP: (!) 148/92 (!) 142/94  Pulse: 96 90  Resp: 18 20  Temp: 98.1 F (36.7 C)   SpO2: 100% 100%    CONSTITUTIONAL: Well-appearing, NAD NEURO:  Alert and oriented x 3, no focal deficits EYES:  eyes equal and reactive ENT/NECK:  no LAD, no JVD CARDIO: Regular rate, well-perfused, normal S1 and S2 PULM:  CTAB no wheezing or rhonchi GI/GU:  normal bowel sounds, non-distended, non-tender MSK/SPINE:  No gross deformities, no edema SKIN:  no rash, atraumatic PSYCH:  Appropriate speech and behavior; mildly anxious  Diagnostic and Interventional Summary    EKG Interpretation  Date/Time:  Wednesday November 19 2018 21:30:56 EST Ventricular Rate:  91 PR Interval:    QRS Duration: 93 QT Interval:  344 QTC Calculation: 424 R Axis:   22 Text Interpretation:  Sinus  rhythm Low voltage, precordial leads Confirmed by Gerlene Fee [phone]) on 11/19/2018 9:42:37 PM      Labs Reviewed  CBC - Abnormal; Notable for the following components:      Result Value   WBC 12.1 (*)    All other components within normal limits  COMPREHENSIVE METABOLIC PANEL - Abnormal; Notable for the following components:   Glucose, Bld 189 (*)    Total Protein 8.6 (*)    All other components within normal limits  CBG MONITORING, ED - Abnormal; Notable for the following components:   Glucose-Capillary 190 (*)    All other components within normal limits  LIPASE, BLOOD  I-STAT TROPONIN, ED  I-STAT CG4 LACTIC ACID, ED    DG Chest 2 View  Final Result      Medications  sodium chloride 0.9 % bolus 1,000 mL (1,000 mLs Intravenous New Bag/Given 11/19/18 2159)  LORazepam (ATIVAN) tablet 1 mg (1 mg Oral Given 11/19/18 2156)     Procedures Critical Care  ED Course and Medical Decision Making  I have reviewed the triage vital signs and the nursing notes.  Pertinent labs & imaging results that were available during my care of the patient were reviewed by me and considered in my medical decision making (see below for details).  Favoring large component of anxiety and this 23 year old male with atypical chest pain with shortness of breath.  No leg swelling or pain, no hypoxia, no risk factors for PE.  EKG without acute concerns or changes.  Labs and chest x-ray pending.  Labs reassuring, no DKA, troponin negative.  Chest x-ray normal.  Patient is feeling much better with resolution of chest pain after p.o. Ativan.  Favoring anxiety, prescription for Atarax to be used as needed, advised follow-up with therapist.  After the discussed management above, the patient was determined to be safe for discharge.  The patient was in agreement with this plan and all questions regarding their care were answered.  ED return precautions were discussed and the patient will return to the ED with any  significant worsening of condition.  Barth Kirks. Sedonia Small, [place] [email or web address]  Final Clinical Impressions(s) / ED Diagnoses     ICD-10-CM   1. Chest pain R07.9 DG Chest 2 View    DG Chest 2 View    ED Discharge Orders         Ordered    hydrOXYzine (ATARAX/VISTARIL) 25 MG tablet  Every 6 hours PRN     11/19/18 2241  Maudie Flakes, MD 11/19/18 2242

## 2018-11-19 NOTE — Discharge Instructions (Addendum)
You were evaluated in the Emergency Department and after careful evaluation, we did not find any emergent condition requiring admission or further testing in the hospital.  Your symptoms today seem to be due to anxiety.  Please call the number provided to establish with a therapist.  You can use the medication provided as needed for anxiety.  Please return to the Emergency Department if you experience any worsening of your condition.  We encourage you to follow up with a primary care provider.  Thank you for allowing [place] to be a part of your care.

## 2018-11-22 ENCOUNTER — Encounter (HOSPITAL_COMMUNITY): Payer: Self-pay | Admitting: Emergency Medicine

## 2018-11-22 ENCOUNTER — Other Ambulatory Visit: Payer: Self-pay

## 2018-11-22 ENCOUNTER — Emergency Department (HOSPITAL_COMMUNITY)
Admission: EM | Admit: 2018-11-22 | Discharge: 2018-11-22 | Disposition: A | Discharge status: Home / Self Care | Payer: Medicaid Other | Attending: Emergency Medicine | Admitting: Emergency Medicine

## 2018-11-22 DIAGNOSIS — Z79899 Other long term (current) drug therapy: Secondary | ICD-10-CM | POA: Diagnosis not present

## 2018-11-22 DIAGNOSIS — E119 Type 2 diabetes mellitus without complications: Secondary | ICD-10-CM | POA: Insufficient documentation

## 2018-11-22 DIAGNOSIS — F419 Anxiety disorder, unspecified: Secondary | ICD-10-CM | POA: Diagnosis not present

## 2018-11-22 DIAGNOSIS — Z7722 Contact with and (suspected) exposure to environmental tobacco smoke (acute) (chronic): Secondary | ICD-10-CM | POA: Insufficient documentation

## 2018-11-22 DIAGNOSIS — I10 Essential (primary) hypertension: Secondary | ICD-10-CM | POA: Diagnosis not present

## 2018-11-22 DIAGNOSIS — Z794 Long term (current) use of insulin: Secondary | ICD-10-CM | POA: Insufficient documentation

## 2018-11-22 MED ORDER — HYDROXYZINE HCL 50 MG/ML IM SOLN
50.0000 mg | Freq: Once | INTRAMUSCULAR | Status: AC
  Administered 2018-11-22: 50 mg via INTRAMUSCULAR
  Filled 2018-11-22: qty 1

## 2018-11-22 NOTE — ED Triage Notes (Signed)
Pt c/o of high blood pressure. He checked it at home, states it was 159/119

## 2018-11-22 NOTE — ED Provider Notes (Signed)
[facility] EMERGENCY DEPARTMENT Provider Note   CSN: [id number] Arrival date & time: 11/22/18  0551     History   Chief Complaint Chief Complaint  Patient presents with  . Anxiety    HPI [name] is a 22 y.o. male.  Patient complains of anxiety.  He was seen couple days ago with anxiety and was given a prescription for Vistaril which she has not filled yet  The history is provided by the patient. No language interpreter was used.  Illness  This is a recurrent problem. The current episode started more than 2 days ago. The problem occurs constantly. The problem has not changed since onset.Pertinent negatives include no chest pain, no abdominal pain and no headaches. Nothing aggravates the symptoms. Nothing relieves the symptoms. He has tried nothing for the symptoms. The treatment provided no relief.    Past Medical History:  Diagnosis Date  . Diabetes mellitus, type II ([place])   . Heart murmur   . Hypertension     Patient Active Problem List   Diagnosis Date Noted  . Personal history of noncompliance with medical treatment, presenting hazards to health 03/15/2017  . Essential hypertension, benign 03/15/2017  . Uncontrolled type 2 diabetes mellitus with complication ([place]) 02/63/7858    Past Surgical History:  Procedure Laterality Date  . 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 Authorizing Provider  carvedilol (COREG) 6.25 MG tablet Take 6.25 mg by mouth 2 (two) times daily. as directed 10/15/18  Yes [provider]  glucose blood (ACCU-CHEK AVIVA) test strip Use as instructed 4 x daily. E11.65 08/10/18  Yes Isla Pence, MD  Insulin Glargine (LANTUS SOLOSTAR) 100 UNIT/ML Solostar Pen Inject 50 Units into the skin at bedtime. Patient taking differently: Inject 40-50 Units into the skin at bedtime.  08/27/18  Yes Nida, Marella Chimes, MD  Insulin Pen Needle (BD PEN NEEDLE NANO U/F) 32G X 4 MM MISC 1 each by Does not apply route 4 (four) times daily. 08/10/18  Yes Isla Pence, MD  Lancet Devices [facility]) lancets Use as instructed 4 x daily. e11.65 04/03/17  Yes Cassandria Anger, MD  Lancets (ACCU-CHEK SOFT TOUCH) lancets Use as instructed 08/10/18  Yes Isla Pence, MD  metFORMIN (GLUCOPHAGE) 500 MG tablet Take 500 mg by mouth 2 (two) times daily with a meal.    Yes [provider]  omeprazole (PRILOSEC) 40 MG capsule Take 40 mg by mouth daily.  10/16/18  Yes [provider]  hydrOXYzine (ATARAX/VISTARIL) 25 MG tablet Take 1 tablet (25 mg total) by mouth every 6 (six) hours as needed for anxiety. 11/19/18   Maudie Flakes, MD    Family History Family History  Problem Relation Age of Onset  . Diabetes Maternal Grandmother   . Thyroid disease Maternal Grandmother   . Hypertension Maternal Grandmother     Social History Social History   Tobacco Use  . Smoking status: Passive Smoke Exposure - Never Smoker  . Smokeless tobacco: Never Used  Substance Use Topics  . Alcohol use: No  . Drug use: No     Allergies   Amoxicillin   Review of Systems Review of Systems  Constitutional: Negative for appetite change and fatigue.  HENT: Negative for congestion, ear discharge and sinus pressure.   Eyes: Negative for discharge.  Respiratory:  Negative for cough.   Cardiovascular: Negative for chest pain.  Gastrointestinal: Negative for abdominal pain and diarrhea.  Genitourinary: Negative for frequency and hematuria.  Musculoskeletal: Negative for back pain.  Skin: Negative for rash.  Neurological: Negative for seizures and headaches.  Psychiatric/Behavioral: Positive for agitation. Negative for hallucinations.     Physical Exam Updated Vital Signs BP (!) 142/96 (BP Location: Right Arm)   Pulse 99   Temp 98.3 F (36.8 C) (Oral)   Resp 18   Ht 6\' 2"  (1.88 m)   Wt 90.3 kg    SpO2 100%   BMI 25.55 kg/m   Physical Exam  Constitutional: He is oriented to person, place, and time. He appears well-developed.  HENT:  Head: Normocephalic.  Eyes: Conjunctivae and EOM are normal. No scleral icterus.  Neck: Neck supple. No thyromegaly present.  Cardiovascular: Normal rate and regular rhythm. Exam reveals no gallop and no friction rub.  No murmur heard. Pulmonary/Chest: No stridor. He has no wheezes. He has no rales. He exhibits no tenderness.  Abdominal: He exhibits no distension. There is no tenderness. There is no rebound.  Musculoskeletal: Normal range of motion. He exhibits no edema.  Lymphadenopathy:    He has no cervical adenopathy.  Neurological: He is oriented to person, place, and time. He exhibits normal muscle tone. Coordination normal.  Skin: No rash noted. No erythema.  Psychiatric:  Patient mildly anxious but not suicidal or homicidal     ED Treatments / Results  Labs (all labs ordered are listed, but only abnormal results are displayed) Labs Reviewed - No data to display  EKG None  Radiology No results found.  Procedures Procedures (including critical care time)  Medications Ordered in ED Medications  hydrOXYzine (VISTARIL) injection 50 mg (50 mg Intramuscular Given 11/22/18 0810)     Initial Impression / Assessment and Plan / ED Course  I have reviewed the triage vital signs and the nursing notes.  Pertinent labs & imaging results that were available during my care of the patient were reviewed by me and considered in my medical decision making (see chart for details).     Patient's anxiety improved with Vistaril.  He will get his Vistaril prescription filled and he is [facility] [place]  Final Clinical Impressions(s) / ED Diagnoses   Final diagnoses:  Anxiety    ED Discharge Orders    None       Milton Ferguson, MD 11/22/18 1119

## 2018-11-22 NOTE — Discharge Instructions (Addendum)
Follow-up at [facility] next week for your anxiety.  Get your prescription filled that you were given previously

## 2018-11-26 ENCOUNTER — Ambulatory Visit (INDEPENDENT_AMBULATORY_CARE_PROVIDER_SITE_OTHER): Payer: Medicaid Other | Admitting: "Endocrinology

## 2018-11-26 ENCOUNTER — Encounter: Payer: Self-pay | Admitting: "Endocrinology

## 2018-11-26 VITALS — BP 118/83 | HR 98 | Ht 74.0 in | Wt 195.0 lb

## 2018-11-26 DIAGNOSIS — E1065 Type 1 diabetes mellitus with hyperglycemia: Secondary | ICD-10-CM

## 2018-11-26 DIAGNOSIS — I10 Essential (primary) hypertension: Secondary | ICD-10-CM | POA: Diagnosis not present

## 2018-11-26 MED ORDER — INSULIN GLARGINE 100 UNIT/ML SOLOSTAR PEN: 50.0000 [IU] | PEN_INJECTOR | Freq: Every day | SUBCUTANEOUS | 2 refills | Status: DC

## 2018-11-26 NOTE — Progress Notes (Signed)
Endocrinology follow-up note   Subjective:    Patient ID: [name], male    DOB: [birth year]. Patient is being seen in f/u for management of diabetes requested by  [name], FNP  Past Medical History:  Diagnosis Date  . Diabetes mellitus, type II ([place])   . Heart murmur   . Hypertension    Past Surgical History:  Procedure Laterality Date  . PALATE / UVULA BIOPSY / EXCISION     "growth removed"   Social History   Socioeconomic History  . Marital status: Single    Spouse name: Not on file  . Number of children: Not on file  . Years of education: Not on file  . Highest education level: Not on file  Occupational History  . Not on file  Social Needs  . Financial resource strain: Not on file  . Food insecurity:    Worry: Not on file    Inability: Not on file  . Transportation needs:    Medical: Not on file    Non-medical: Not on file  Tobacco Use  . Smoking status: Passive Smoke Exposure - Never Smoker  . Smokeless tobacco: Never Used  Substance and Sexual Activity  . Alcohol use: No  . Drug use: No  . Sexual activity: Not on file  Lifestyle  . Physical activity:    Days per week: Not on file    Minutes per session: Not on file  . Stress: Not on file  Relationships  . Social connections:    Talks on phone: Not on file    Gets together: Not on file    Attends religious service: Not on file    Active member of club or organization: Not on file    Attends meetings of clubs or organizations: Not on file    Relationship status: Not on file  Other Topics Concern  . Not on file  Social History Narrative  . Not on file   Outpatient Encounter Medications as of 11/26/2018  Medication Sig  . carvedilol (COREG) 6.25 MG tablet Take 6.25 mg by mouth 2 (two) times daily. as directed  . glucose blood (ACCU-CHEK AVIVA) test strip Use as instructed 4 x daily. E11.65  . hydrOXYzine (ATARAX/VISTARIL) 25 MG tablet Take 1 tablet (25 mg total) by mouth every 6  (six) hours as needed for anxiety.  . Insulin Glargine (LANTUS SOLOSTAR) 100 UNIT/ML Solostar Pen Inject 50 Units into the skin at bedtime.  . Insulin Pen Needle (BD PEN NEEDLE NANO U/F) 32G X 4 MM MISC 1 each by Does not apply route 4 (four) times daily.  Elmore Guise Devices (ACCU-CHEK SOFTCLIX) lancets Use as instructed 4 x daily. e11.65  . Lancets (ACCU-CHEK SOFT TOUCH) lancets Use as instructed  . metFORMIN (GLUCOPHAGE) 500 MG tablet Take 500 mg by mouth 2 (two) times daily with a meal.   . omeprazole (PRILOSEC) 40 MG capsule Take 40 mg by mouth daily.   . [DISCONTINUED] Insulin Glargine (LANTUS SOLOSTAR) 100 UNIT/ML Solostar Pen Inject 50 Units into the skin at bedtime. (Patient taking differently: Inject 40-50 Units into the skin at bedtime. )   No facility-administered encounter medications on file as of 11/26/2018.    ALLERGIES: Allergies  Allergen Reactions  . Amoxicillin Hives    Has patient had a PCN reaction causing immediate rash, facial/tongue/throat swelling, SOB or lightheadedness with hypotension: No Has patient had a PCN reaction causing severe rash involving mucus membranes or skin necrosis: No Has patient had  a PCN reaction that required hospitalization: No Has patient had a PCN reaction occurring within the last 10 years: No If all of the above answers are "NO", then may proceed with Cephalosporin use.    VACCINATION STATUS:  There is no immunization history on file for this patient.  Diabetes  He presents for his follow-up diabetic visit. He has type 2 diabetes mellitus. Onset time: He was diagnosed at approximate age of 63 years. His disease course has been worsening. There are no hypoglycemic associated symptoms. Pertinent negatives for hypoglycemia include no confusion, headaches, pallor or seizures. Associated symptoms include blurred vision, polydipsia and polyuria. Pertinent negatives for diabetes include no chest pain, no fatigue, no polyphagia and no weakness.  There are no hypoglycemic complications. Symptoms are improving. There are no diabetic complications. (Noncompliance/nonadherence.) Risk factors for coronary artery disease include diabetes mellitus, male sex and sedentary lifestyle. His weight is fluctuating minimally. He is following a generally unhealthy diet. When asked about meal planning, he reported none. He has not had a previous visit with a dietitian. He rarely participates in exercise. His breakfast blood glucose range is generally 180-200 mg/dl. His overall blood glucose range is 180-200 mg/dl. (He missed his appointment since February 2018.  More recently he had visited ER on more than one occasion complaining of shortness of breath, and was advised to resume follow-up with me.  His recent A1c is 12.5% largely unchanged from 13% A1c during his last visit.  He admits that he has restarted his insulin after the ER visit.  His average blood glucose over the last 14 days is 199. ) An ACE inhibitor/angiotensin II receptor blocker is not being taken. He does not see a podiatrist.Eye exam is not current.    Review of Systems  Constitutional: Positive for unexpected weight change. Negative for chills, fatigue and fever.        He has regained 12 pounds over the last 2 weeks after he lost approximately 20 pounds over the last year.  HENT: Negative for dental problem, mouth sores and trouble swallowing.   Eyes: Positive for blurred vision. Negative for visual disturbance.  Respiratory: Negative for cough, choking, chest tightness, shortness of breath and wheezing.   Cardiovascular: Negative for chest pain, palpitations and leg swelling.  Gastrointestinal: Negative for abdominal distention, abdominal pain, constipation, diarrhea, nausea and vomiting.  Endocrine: Positive for polydipsia and polyuria. Negative for polyphagia.  Genitourinary: Negative for dysuria, flank pain, hematuria and urgency.  Musculoskeletal: Negative for back pain, gait  problem, myalgias and neck pain.  Skin: Negative for pallor, rash and wound.  Neurological: Negative for seizures, syncope, weakness, numbness and headaches.  Psychiatric/Behavioral: Negative.  Negative for confusion and dysphoric mood.    Objective:    BP 118/83   Pulse 98   Ht 6\' 2"  (1.88 m)   Wt 195 lb (88.5 kg)   BMI 25.04 kg/m   Wt Readings from Last 3 Encounters:  11/26/18 195 lb (88.5 kg)  11/22/18 199 lb (90.3 kg)  11/19/18 199 lb (90.3 kg)    Physical Exam  Constitutional: He is oriented to person, place, and time. He appears well-developed and well-nourished. He is cooperative. No distress.  HENT:  Head: Normocephalic and atraumatic.  Eyes: EOM are normal.  Neck: Normal range of motion. Neck supple. No tracheal deviation present. No thyromegaly present.  Cardiovascular: Normal rate, S1 normal, S2 normal and normal heart sounds. Exam reveals no gallop.  No murmur heard. Pulses:      Dorsalis  pedis pulses are 1+ on the right side, and 1+ on the left side.       Posterior tibial pulses are 1+ on the right side, and 1+ on the left side.  Pulmonary/Chest: Breath sounds normal. No respiratory distress. He has no wheezes.  Abdominal: Soft. Bowel sounds are normal. He exhibits no distension. There is no tenderness. There is no guarding and no CVA tenderness.  Musculoskeletal: He exhibits no edema.       Right shoulder: He exhibits no swelling and no deformity.  Neurological: He is alert and oriented to person, place, and time. He has normal strength and normal reflexes. No cranial nerve deficit or sensory deficit. Gait normal.  Skin: Skin is warm and dry. No rash noted. No cyanosis. Nails show no clubbing.  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  Psychiatric: He has a normal mood and affect. His speech is normal. Cognition and memory are normal.  Unconcerned affect, reluctant affect.    Recent Results (from the past 2160 hour(s))   Hemoglobin A1c     Status: Abnormal   Collection Time: 11/18/18 10:32 AM  Result Value Ref Range   Hgb A1c MFr Bld 8.6 (H) <5.7 % of total Hgb    Comment: For someone without known diabetes, a hemoglobin A1c value of 6.5% or greater indicates that they may have  diabetes and this should be confirmed with a follow-up  test. . For someone with known diabetes, a value <7% indicates  that their diabetes is well controlled and a value  greater than or equal to 7% indicates suboptimal  control. A1c targets should be individualized based on  duration of diabetes, age, comorbid conditions, and  other considerations. . Currently, no consensus exists regarding use of hemoglobin A1c for diagnosis of diabetes for children. .    Mean Plasma Glucose 200 (calc)   eAG (mmol/L) 11.1 (calc)  COMPLETE METABOLIC PANEL WITH GFR     Status: Abnormal   Collection Time: 11/18/18 10:32 AM  Result Value Ref Range   Glucose, Bld 159 (H) 65 - 99 mg/dL    Comment: .            Fasting reference interval . For someone without known diabetes, a glucose value >125 mg/dL indicates that they may have diabetes and this should be confirmed with a follow-up test. .    BUN 11 7 - 25 mg/dL   Creat 0.78 0.60 - 1.35 mg/dL   GFR, Est Non African American 130 > OR = 60 mL/min/1.11m2   GFR, Est African American 151 > OR = 60 mL/min/1.14m2   BUN/Creatinine Ratio NOT APPLICABLE 6 - 22 (calc)   Sodium 139 135 - 146 mmol/L   Potassium 4.4 3.5 - 5.3 mmol/L   Chloride 101 98 - 110 mmol/L   CO2 31 20 - 32 mmol/L   Calcium 10.2 8.6 - 10.3 mg/dL   Total Protein 7.9 6.1 - 8.1 g/dL   Albumin 4.6 3.6 - 5.1 g/dL   Globulin 3.3 1.9 - 3.7 g/dL (calc)   AG Ratio 1.4 1.0 - 2.5 (calc)   Total Bilirubin 0.5 0.2 - 1.2 mg/dL   Alkaline phosphatase (APISO) 48 40 - 115 U/L   AST 16 10 - 40 U/L   ALT 17 9 - 46 U/L  T4, free     Status: Abnormal   Collection Time: 11/18/18 10:32 AM  Result Value Ref Range   Free T4 1.5 (H) 0.8  -  1.4 ng/dL  TSH     Status: None   Collection Time: 11/18/18 10:32 AM  Result Value Ref Range   TSH 1.02 0.40 - 4.50 mIU/L  VITAMIN D 25 Hydroxy (Vit-D Deficiency, Fractures)     Status: Abnormal   Collection Time: 11/18/18 10:32 AM  Result Value Ref Range   Vit D, 25-Hydroxy 19 (L) 30 - 100 ng/mL    Comment: Vitamin D Status         25-OH Vitamin D: . Deficiency:                    <20 ng/mL Insufficiency:             20 - 29 ng/mL Optimal:                 > or = 30 ng/mL . For 25-OH Vitamin D testing on patients on  D2-supplementation and patients for whom quantitation  of D2 and D3 fractions is required, the QuestAssureD(TM) 25-OH VIT D, (D2,D3), LC/MS/MS is recommended: order  code [phone] (patients >42yrs). . For more information on this test, go to: http://education.questdiagnostics.com/faq/FAQ163 (This link is being provided for  informational/educational purposes only.)   CBC     Status: Abnormal   Collection Time: 11/19/18  9:37 PM  Result Value Ref Range   WBC 12.1 (H) 4.0 - 10.5 K/uL   RBC 5.74 4.22 - 5.81 MIL/uL   Hemoglobin 16.1 13.0 - 17.0 g/dL   HCT 48.9 39.0 - 52.0 %   MCV 85.2 80.0 - 100.0 fL   MCH 28.0 26.0 - 34.0 pg   MCHC 32.9 30.0 - 36.0 g/dL   RDW 12.1 11.5 - 15.5 %   Platelets 330 150 - 400 K/uL   nRBC 0.0 0.0 - 0.2 %    Comment: Performed at [facility], [address], [place], [place] [address]  Comprehensive metabolic panel     Status: Abnormal   Collection Time: 11/19/18  9:37 PM  Result Value Ref Range   Sodium 139 135 - 145 mmol/L   Potassium 3.6 3.5 - 5.1 mmol/L   Chloride 102 98 - 111 mmol/L   CO2 27 22 - 32 mmol/L   Glucose, Bld 189 (H) 70 - 99 mg/dL   BUN 12 6 - 20 mg/dL   Creatinine, Ser 0.70 0.61 - 1.24 mg/dL   Calcium 9.9 8.9 - 10.3 mg/dL   Total Protein 8.6 (H) 6.5 - 8.1 g/dL   Albumin 4.7 3.5 - 5.0 g/dL   AST 18 15 - 41 U/L   ALT 20 0 - 44 U/L   Alkaline Phosphatase 47 38 - 126 U/L   Total Bilirubin 0.8 0.3 - 1.2 mg/dL    GFR calc non Af Amer >60 >60 mL/min   GFR calc Af Amer >60 >60 mL/min   Anion gap 10 5 - 15    Comment: Performed at [facility], [address] [place], [place] [address]  Lipase, blood     Status: None   Collection Time: 11/19/18  9:37 PM  Result Value Ref Range   Lipase 24 11 - 51 U/L    Comment: Performed at [facility], [address] [place], [place] [address]  I-stat troponin, ED     Status: None   Collection Time: 11/19/18  9:48 PM  Result Value Ref Range   Troponin i, poc 0.00 0.00 - 0.08 ng/mL   Comment 3  Comment: Due to the release kinetics of cTnI, a negative result within the first hours of the onset of symptoms does not rule out myocardial infarction with certainty. If myocardial infarction is still suspected, repeat the test at appropriate intervals.   CBG monitoring, ED     Status: Abnormal   Collection Time: 11/19/18  9:52 PM  Result Value Ref Range   Glucose-Capillary 190 (H) 70 - 99 mg/dL  I-Stat CG4 Lactic Acid, ED     Status: None   Collection Time: 11/19/18  9:52 PM  Result Value Ref Range   Lactic Acid, Venous 0.97 0.5 - 1.9 mmol/L    August 14, 2018 A1c 12.5%.  Assessment & Plan:   1. Uncontrolled type  ? diabetes mellitus with complication, unspecified long term insulin use status ([place]) -This patient's care is complicated by history of  noncompliance/nonadherence.  He has been changing the dose of his basal insulin for unclear reasons.  - He is not very sure on the type of diabetes he has, denies any history of diabetic ketoacidosis, suggesting likely type 2 diabetes.  - Patient has currently uncontrolled symptomatic type ? DM since 22  years of age ,  with most recent A1c of 8.6%, progressively improving from 13%.    - Recent labs reviewed.   He still does not report any gross complications from his diabetes, however he remains at extremely high risk for  more acute and chronic complications of diabetes which include CAD, CVA, CKD,  retinopathy, and neuropathy. These are all discussed in detail with the patient.  - I have counseled the patient on diet management and  by adopting a carbohydrate restricted/protein rich diet, the presence of his mother in the room. - He does not have excessive weight to lose at this time. -  Suggestion is made for him to avoid simple carbohydrates  from his diet including Cakes, Sweet Desserts / Pastries, Ice Cream, Soda (diet and regular), Sweet Tea, Candies, Chips, Cookies, Store Bought Juices, Alcohol in Excess of  1-2 drinks a day, Artificial Sweeteners, and "Sugar-free" Products. This will help patient to have stable blood glucose profile and potentially avoid unintended weight gain.  - I encouraged the patient to switch to  unprocessed or minimally processed complex starch and increased protein intake (animal or plant source), fruits, and vegetables.  - Patient is advised to stick to a routine mealtimes to eat 3 meals  a day and avoid unnecessary snacks ( to snack only to correct hypoglycemia).   -He is urged to reestablish follow-up with  Jearld Fenton, RDN, CDE for individualized DM education.  - I have approached patient with the following individualized plan to manage diabetes and patient agrees:   -He reluctantly accepts to continue monitoring blood glucose at least 2 times a day-daily before breakfast and at bedtime. -He is encouraged to call clinic for blood glucose readings less than 70 or greater than 2003 readings. -In the meantime, I urged him not to change around the dose of his Lantus, continue 50 units nightly  (there is evidence that he has been benefiting from his basal insulin). -He is very likely to require intensive treatment with basal/bolus insulin in order for him to achieve and maintain control of diabetes to target.  - For this to happen, his commitment for proper monitoring should be assured first.  -He may benefit from low-dose metformin treatment.  He is  advised to continue metformin 500 mg p.o. twice daily-daily after breakfast and supper.  -  His next labs will include anti-GAD antibodies as well as anti-islet cell antibodies to characterize his diabetes properly.   - He is not a candidate for incretin, nor SGLT2 inhibitors therapy . - Patient specific target  A1c;  LDL, HDL, Triglycerides, and  Waist Circumference were discussed in detail.  2) BP/HTN: His blood pressure is controlled to target.   He has benefited from her hydrochlorothiazide treatment.  He is advised to continue hydrochlorothiazide 25 mg p.o. daily.    3) Lipids/HPL: No recent lipid panel, patient is not on statins.   4)  Weight/Diet:  He has currently appropriate weight, CDE Consult will be reinitiated , exercise, and detailed carbohydrates information provided.  5) Chronic Care/Health Maintenance:  -Patient is not  on ACEI/ARB and Statin medications and encouraged to continue to follow up with Ophthalmology, cardiology, podiatrist at least yearly or according to recommendations, and advised to   stay away from smoking. I have recommended yearly flu vaccine and pneumonia vaccination at least every 5 years; moderate intensity exercise for up to 150 minutes weekly; and  sleep for at least 7 hours a day. - I advised patient to maintain close follow up with [name], FNP for primary care needs. - Time spent with the patient: 25 min, of which >50% was spent in reviewing his blood glucose logs , discussing his hypo- and hyper-glycemic episodes, reviewing his current and  previous labs and insulin doses and developing a plan to avoid hypo- and hyper-glycemia. Please refer to Patient Instructions for Blood Glucose Monitoring and Insulin/Medications Dosing Guide"  in media tab for additional information. [name] participated in the discussions, expressed understanding, and voiced agreement with the above plans.  All questions were answered to his satisfaction. he is  encouraged to contact clinic should he have any questions or concerns prior to his return visit.   Follow up plan: - Return in about 3 months (around 02/25/2019) for Follow up with Pre-visit Labs, Meter, and Logs.  Glade Lloyd, MD Phone: [phone]  Fax: [phone]   11/26/2018, 2:37 PM

## 2018-11-26 NOTE — Patient Instructions (Signed)

## 2018-12-01 ENCOUNTER — Other Ambulatory Visit: Payer: Self-pay | Admitting: *Deleted

## 2018-12-01 MED ORDER — CARVEDILOL 25 MG PO TABS: 25.0000 mg | ORAL_TABLET | Freq: Two times a day (BID) | ORAL | 6 refills | Status: DC

## 2019-01-13 ENCOUNTER — Other Ambulatory Visit: Payer: Self-pay | Admitting: "Endocrinology

## 2019-01-13 MED ORDER — BLOOD GLUCOSE MONITOR KIT: PACK | 5 refills | Status: AC

## 2019-02-18 ENCOUNTER — Encounter (INDEPENDENT_AMBULATORY_CARE_PROVIDER_SITE_OTHER): Payer: Self-pay | Admitting: Internal Medicine

## 2019-02-18 ENCOUNTER — Ambulatory Visit (INDEPENDENT_AMBULATORY_CARE_PROVIDER_SITE_OTHER): Payer: BLUE CROSS/BLUE SHIELD | Admitting: Internal Medicine

## 2019-02-18 VITALS — BP 129/79 | HR 89 | Temp 98.1°F | Resp 18 | Ht 75.0 in | Wt 220.6 lb

## 2019-02-18 DIAGNOSIS — K219 Gastro-esophageal reflux disease without esophagitis: Secondary | ICD-10-CM

## 2019-02-18 NOTE — Progress Notes (Signed)
Subjective:    Patient ID: ZED WANNINGER, male    DOB: 07/17/1996, 23 y.o.   MRN: [id number]  HPI Referred by [name] FNP  epigastric pain. States he has burps that smell like sulfa. Denies any nausea or vomiting. States since starting the Omeprazole, his epigastric pain has resolved.  He is not on any particular diet.  He says he does watch his sweats. HA1C 7.1 in January. Diagnosed late 2012 or early 2013.  His BM move okay. No melena or BRRB. BM daily.  He says certain pizzas and cheese bother him.  No bloating since February.     Review of Systems Past Medical History:  Diagnosis Date  . Diabetes mellitus, type II ([place])   . Heart murmur   . Hypertension     Past Surgical History:  Procedure Laterality Date  . PALATE / UVULA BIOPSY / EXCISION     "growth removed"  . UPPER GASTROINTESTINAL ENDOSCOPY     Done in [place] - 2013 ish    Allergies  Allergen Reactions  . Amoxicillin Hives    Has patient had a PCN reaction causing immediate rash, facial/tongue/throat swelling, SOB or lightheadedness with hypotension: No Has patient had a PCN reaction causing severe rash involving mucus membranes or skin necrosis: No Has patient had a PCN reaction that required hospitalization: No Has patient had a PCN reaction occurring within the last 10 years: No If all of the above answers are "NO", then may proceed with Cephalosporin use.     Current Outpatient Medications on File Prior to Visit  Medication Sig Dispense Refill  . blood glucose meter kit and supplies KIT Dispense based on patient and insurance preference. Use up to four times daily as directed. (FOR ICD-10 E11.65). 1 each 5  . carvedilol (COREG) 25 MG tablet Take 1 tablet (25 mg total) by mouth 2 (two) times daily. 60 tablet 6  . hydrOXYzine (ATARAX/VISTARIL) 25 MG tablet Take 1 tablet (25 mg total) by mouth every 6 (six) hours as needed for anxiety. 30 tablet 0  . Insulin Glargine (LANTUS SOLOSTAR) 100  UNIT/ML Solostar Pen Inject 50 Units into the skin at bedtime. (Patient taking differently: Inject 60 Units into the skin at bedtime. ) 5 pen 2  . Insulin Pen Needle (BD PEN NEEDLE NANO U/F) 32G X 4 MM MISC 1 each by Does not apply route 4 (four) times daily. 150 each 3  . Lancet Devices (ACCU-CHEK SOFTCLIX) lancets Use as instructed 4 x daily. e11.65 150 each 5  . Lancets (ACCU-CHEK SOFT TOUCH) lancets Use as instructed 100 each 12  . metFORMIN (GLUCOPHAGE) 500 MG tablet Take 500 mg by mouth 2 (two) times daily with a meal.     . metoCLOPramide (REGLAN) 5 MG tablet TAKE 1 TABLET BY MOUTH 2 3 TIMES DAILY BEFORE MEALS    . mirtazapine (REMERON) 30 MG tablet Take 30 mg by mouth at bedtime.     Marland Kitchen omeprazole (PRILOSEC) 20 MG capsule Take 20 mg by mouth daily.   1   No current facility-administered medications on file prior to visit.         Objective:   Physical Exam Blood pressure 129/79, pulse 89, temperature 98.1 F (36.7 C), temperature source Oral, resp. rate 18, height 6' 3"  (1.905 m), weight 220 lb 9.6 oz (100.1 kg). Alert and oriented. Skin warm and dry. Oral mucosa is moist.   . Sclera anicteric, conjunctivae is pink. Thyroid not enlarged. No cervical lymphadenopathy.  Lungs clear. Heart regular rate and rhythm.  Abdomen is soft. Bowel sounds are positive. No hepatomegaly. No abdominal masses felt. No tenderness.  No edema to lower extremities.          Assessment & Plan:  Epigastric pain, bloating. He will continue the Omeprazole. Am going to get a H. pylori

## 2019-02-18 NOTE — Patient Instructions (Signed)
Am going to get an H. Pylori. Continue the Omeprazole

## 2019-02-25 ENCOUNTER — Ambulatory Visit: Payer: BLUE CROSS/BLUE SHIELD | Admitting: "Endocrinology

## 2019-03-17 ENCOUNTER — Ambulatory Visit: Payer: BLUE CROSS/BLUE SHIELD | Admitting: "Endocrinology

## 2019-03-21 LAB — GLUTAMIC ACID DECARBOXYLASE AUTO ABS: Glutamic Acid Decarb Ab: <5 IU/mL (ref <5)

## 2019-03-21 LAB — HEMOGLOBIN A1C
HEMOGLOBIN A1C: 9.7 %{Hb} — AB (ref <5.7)
Mean Plasma Glucose: 232 (calc)
eAG (mmol/L): 12.8 (calc)

## 2019-03-21 LAB — COMPLETE METABOLIC PANEL WITH GFR
AG RATIO: 1.3 (calc) (ref 1.0–2.5)
ALBUMIN MSPROF: 4.6 g/dL (ref 3.6–5.1)
ALKALINE PHOSPHATASE (APISO): 66 U/L (ref 36–130)
ALT: 30 U/L (ref 9–46)
AST: 23 U/L (ref 10–40)
BILIRUBIN TOTAL: 0.3 mg/dL (ref 0.2–1.2)
BUN: 12 mg/dL (ref 7–25)
CO2: 27 mmol/L (ref 20–32)
CREATININE: 0.84 mg/dL (ref 0.60–1.35)
Calcium: 10.5 mg/dL — ABNORMAL HIGH (ref 8.6–10.3)
Chloride: 98 mmol/L (ref 98–110)
GFR, Est African American: 145 mL/min/{1.73_m2} (ref >=60)
GFR, Est Non African American: 125 mL/min/{1.73_m2} (ref >=60)
GLOBULIN: 3.5 g/dL (ref 1.9–3.7)
Glucose, Bld: 369 mg/dL — ABNORMAL HIGH (ref 65–99)
Potassium: 5.3 mmol/L (ref 3.5–5.3)
SODIUM: 135 mmol/L (ref 135–146)
Total Protein: 8.1 g/dL (ref 6.1–8.1)

## 2019-03-21 LAB — TSH: TSH: 0.62 m[IU]/L (ref 0.40–4.50)

## 2019-03-21 LAB — ISLET CELL AB SCREEN RFLX TO TITER: ISLET CELL ANTIBODY SCREEN: NEGATIVE

## 2019-03-21 LAB — T4, FREE: Free T4: 1.5 ng/dL (ref 0.8–1.8)

## 2019-03-21 LAB — VITAMIN D 25 HYDROXY (VIT D DEFICIENCY, FRACTURES): Vit D, 25-Hydroxy: 18 ng/mL — ABNORMAL LOW (ref 30–100)

## 2019-03-26 ENCOUNTER — Encounter: Payer: BLUE CROSS/BLUE SHIELD | Admitting: "Endocrinology

## 2020-08-25 NOTE — Progress Notes (Signed)
Cardiology Office Note  Date: 08/26/2020   ID: [name], DOB 1996-02-19, MRN [id number]  PCP:  Lucas Anchors, FNP  Cardiologist:  Lucas Sable, MD (Inactive) Electrophysiologist:  None   Chief Complaint: Palpitations  History of Present Illness: [name] is a 24 y.o. male with a history of palpitations, tachycardia, DM type II, hypertension  Patient last seen by Dr. Bronson Brown on 09/08/2018 for palpitations at the request of Lucas Anchors, FNP.  He had been recently evaluated in the emergency room at [facility] 08/26/2018.  He was tachycardic with a heart rate of 121.  He was normotensive with normal oxygen saturations.  Had a reported history of noncompliance with medical therapy.  He had  previously been evaluated in the ED for palpitations on August 10, 2018.  He stated his heart always races but sometimes it feels worse.  His mother described his whole body shaking.  Blood sugar was 106 that morning.  He was having occasional bilateral chest soreness.  He denied any lightheadedness, dizziness, or syncope.  Dr. Bronson Brown believe the tachycardia and palpitations to be diabetes mediated autonomic neuropathy.  He was taking hydrochlorothiazide for hypertension.  This was discontinued and he was started on carvedilol 3.125 mg p.o. twice daily.  He was being followed by endocrinology for his diabetes.  Recently seen on 08/24/2020 by PCP due to the following complaints: 1 week history of DOE, associated with chest discomfort and palpitations.  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  Given his risk factors and family history of referral was made to consider stress test/TTE/Zio patch.  PFTs were pending.  He presents today with continued complaints of above-mentioned symptoms.  He states he has noticed considerable issues with increasing dyspnea on exertion and exertional fatigue.  He states he can walk from his  car to his home and has significant exertional fatigue and dyspnea.  He helps take care of his 58 year old grandfather and gives out just helping him.  Associated with the exertional dyspnea he has some chest discomfort.  He denies any radiation to neck, arm, back, jaw.  States he does have excessive sweating.  He states is getting out of bed in the morning and standing up causes him to have palpitations.  States his heart rate becomes unusually fast during activity.  EKG today shows normal sinus rhythm rate of 93.  States he has had diabetes since the sixth grade.  It has not been well controlled.  Recent lab work on 05/19/2020 showed his random glucose to be 419.  States he had a hemoglobin A1c today but does not know the results.   Past Medical History:  Diagnosis Date  . Diabetes mellitus, type II ([place])   . Heart murmur   . Hypertension     Past Surgical History:  Procedure Laterality Date  . PALATE / UVULA BIOPSY / EXCISION     "growth removed"  . UPPER GASTROINTESTINAL ENDOSCOPY     Done in [place] - 2013 ish    Current Outpatient Medications  Medication Sig Dispense Refill  . blood glucose meter kit and supplies KIT Dispense based on patient and insurance preference. Use up to four times daily as directed. (FOR ICD-10 E11.65). 1 each 5  . carvedilol (COREG) 25 MG tablet Take 1 tablet (25 mg total) by mouth 2 (two) times daily. 60 tablet 6  . hydrOXYzine (ATARAX/VISTARIL) 25 MG tablet Take 1 tablet (25 mg total)  by mouth every 6 (six) hours as needed for anxiety. 30 tablet 0  . Insulin Glargine (LANTUS SOLOSTAR) 100 UNIT/ML Solostar Pen Inject 50 Units into the skin at bedtime. 5 pen 2  . Insulin Pen Needle (BD PEN NEEDLE NANO U/F) 32G X 4 MM MISC 1 each by Does not apply route 4 (four) times daily. 150 each 3  . Lancet Devices (ACCU-CHEK SOFTCLIX) lancets Use as instructed 4 x daily. e11.65 150 each 5  . Lancets (ACCU-CHEK SOFT TOUCH) lancets Use as instructed 100 each 12  .  metFORMIN (GLUCOPHAGE) 500 MG tablet Take 500 mg by mouth 2 (two) times daily with a meal.     . metoCLOPramide (REGLAN) 5 MG tablet TAKE 1 TABLET BY MOUTH 2 3 TIMES DAILY BEFORE MEALS    . mirtazapine (REMERON) 30 MG tablet Take 30 mg by mouth at bedtime.     Marland Kitchen omeprazole (PRILOSEC) 20 MG capsule Take 20 mg by mouth daily.   1  . zolpidem (AMBIEN) 10 MG tablet Take 10 mg by mouth at bedtime.     No current facility-administered medications for this visit.   Allergies:  Amoxicillin   Social History: The patient  reports that he is a non-smoker but has been exposed to tobacco smoke. He has never used smokeless tobacco. He reports that he does not drink alcohol and does not use drugs.   Family History: The patient's family history includes Diabetes in his maternal grandmother; Healthy in his father, mother, sister, and sister; Hypertension in his maternal grandmother; Thyroid disease in his maternal grandmother.   ROS:  Please see the history of present illness. Otherwise, complete review of systems is positive for none.  All other systems are reviewed and negative.   Physical Exam: VS:  BP (!) 170/82   Pulse 93   Ht 6' 3"  (1.905 m)   Wt 241 lb (109.3 kg)   SpO2 97%   BMI 30.12 kg/m , BMI Body mass index is 30.12 kg/m.  Wt Readings from Last 3 Encounters:  08/26/20 241 lb (109.3 kg)  02/18/19 220 lb 9.6 oz (100.1 kg)  11/26/18 195 lb (88.5 kg)    General: Patient appears comfortable at rest. Neck: Supple, no elevated JVP or carotid bruits, no thyromegaly. Lungs: Clear to auscultation, nonlabored breathing at rest. Cardiac: Regular rate and rhythm, no S3 or significant systolic murmur, no pericardial rub. Extremities: No pitting edema, distal pulses 2+. Skin: Warm and dry. Musculoskeletal: No kyphosis. Neuropsychiatric: Alert and oriented x3, affect grossly appropriate.  ECG:  An ECG dated 08/26/2020 was personally reviewed today and demonstrated:  Normal sinus rhythm HR  93  Recent Labwork: No results found for requested labs within last 8760 hours.  No results found for: CHOL, TRIG, HDL, CHOLHDL, VLDL, LDLCALC, LDLDIRECT  Other Studies Reviewed Today:   Assessment and Plan:  1. DOE (dyspnea on exertion)   2. Chest discomfort   3. Tachycardia   4. Palpitations   5. Essential hypertension, benign   6. Insulin dependent type 2 diabetes mellitus (HCC)   7. Chest pain, unspecified type     1. DOE (dyspnea on exertion) Patient states he is having progressively worsening dyspnea on exertion recently.  He states recently that just walking from his car to his home causes significant dyspnea on exertion and significant exertional fatigue with some chest discomfort.  Please obtain an echocardiogram to assess  LV function, diastolic function and valvular function.  2. Chest discomfort As mentioned above recent increase  in chest discomfort with exertion with associated dyspnea on exertion.  Denies any radiation to neck, arm, jaw, or back.  Please get a Lexiscan Myoview stress test.  3. Tachycardia Previous history of tachycardia.  Last seen by Dr. Bronson Brown who stated tachycardia was likely mediated by autonomic dysfunction from diabetes.  Patient states he becomes tachycardic just getting up from from bed and walking.  Continue carvedilol 25 mg p.o. twice daily.  4. Palpitations Patient states he needs having palpitations at least 4-5 times a day sometimes associated with exertion sometimes without.  States his heart rate goes relatively fast when exerting.  Please get a 14-day ZIO monitor.  5. Essential hypertension, benign Blood pressure is elevated today on arrival.  Recent visit to [facility] on 08/24/2020 his blood pressure was 118/72.  6. Insulin dependent type 2 diabetes mellitus ([place]) Significant history of diabetes type 2 since grade 6.  Not well controlled.  Recent random fasting glucose level in May 2021 03/24/2017.  He follows  with PCP for diabetes management.   Medication Adjustments/Labs and Tests Ordered: Current medicines are reviewed at length with the patient today.  Concerns regarding medicines are outlined above.   Disposition: Follow-up with Dr. Harl Bowie or APP 1 month Signed, Levell July, NP 08/26/2020 2:04 PM    [facility] at [place], [place], [place] [address] Phone: [phone]; Fax: [phone]

## 2020-08-26 ENCOUNTER — Encounter: Payer: Self-pay | Admitting: *Deleted

## 2020-08-26 ENCOUNTER — Ambulatory Visit (INDEPENDENT_AMBULATORY_CARE_PROVIDER_SITE_OTHER): Payer: BC Managed Care – PPO | Admitting: Family Medicine

## 2020-08-26 ENCOUNTER — Other Ambulatory Visit: Payer: Self-pay

## 2020-08-26 ENCOUNTER — Encounter: Payer: Self-pay | Admitting: Family Medicine

## 2020-08-26 ENCOUNTER — Telehealth: Payer: Self-pay | Admitting: Family Medicine

## 2020-08-26 VITALS — BP 170/82 | HR 93 | Ht 75.0 in | Wt 241.0 lb

## 2020-08-26 DIAGNOSIS — R000 Tachycardia, unspecified: Secondary | ICD-10-CM | POA: Diagnosis not present

## 2020-08-26 DIAGNOSIS — Z794 Long term (current) use of insulin: Secondary | ICD-10-CM

## 2020-08-26 DIAGNOSIS — I10 Essential (primary) hypertension: Secondary | ICD-10-CM

## 2020-08-26 DIAGNOSIS — R079 Chest pain, unspecified: Secondary | ICD-10-CM

## 2020-08-26 DIAGNOSIS — R002 Palpitations: Secondary | ICD-10-CM

## 2020-08-26 DIAGNOSIS — R0789 Other chest pain: Secondary | ICD-10-CM

## 2020-08-26 DIAGNOSIS — E119 Type 2 diabetes mellitus without complications: Secondary | ICD-10-CM

## 2020-08-26 DIAGNOSIS — R0609 Other forms of dyspnea: Secondary | ICD-10-CM

## 2020-08-26 DIAGNOSIS — R0600 Dyspnea, unspecified: Secondary | ICD-10-CM

## 2020-08-26 NOTE — Patient Instructions (Signed)
Your physician recommends that you schedule a follow-up appointment in: [place], NP  Your physician recommends that you continue on your current medications as directed. Please refer to the Current Medication list given to you today.  Your physician has requested that you have an echocardiogram. Echocardiography is a painless test that uses sound waves to create images of your heart. It provides your doctor with information about the size and shape of your heart and how well your heart's chambers and valves are working. This procedure takes approximately one hour. There are no restrictions for this procedure.  Your physician has requested that you have a lexiscan myoview. For further information please visit [email or web address]. Please follow instruction sheet, as given.  Your physician has recommended that you wear an event monitor FOR 14 DAYS. Event monitors are medical devices that record the heart's electrical activity. Doctors most often [place] these monitors to diagnose arrhythmias. Arrhythmias are problems with the speed or rhythm of the heartbeat. The monitor is a small, portable device. You can wear one while you do your normal daily activities. This is usually used to diagnose what is causing palpitations/syncope (passing out).  Thank you for choosing [facility]!!

## 2020-08-26 NOTE — Telephone Encounter (Signed)
Pre-cert Verification for the following procedure    LEXISCAN & ECHO   DATE:  09/06/2020  LOCATION: [facility]

## 2020-08-30 NOTE — Addendum Note (Signed)
Addended by: Merlene Laughter on: 08/30/2020 03:07 PM   Modules accepted: Orders

## 2020-09-02 ENCOUNTER — Ambulatory Visit (INDEPENDENT_AMBULATORY_CARE_PROVIDER_SITE_OTHER): Payer: BC Managed Care – PPO

## 2020-09-02 DIAGNOSIS — R002 Palpitations: Secondary | ICD-10-CM | POA: Diagnosis not present

## 2020-09-06 ENCOUNTER — Other Ambulatory Visit: Payer: Self-pay

## 2020-09-06 ENCOUNTER — Ambulatory Visit (HOSPITAL_COMMUNITY)
Admission: RE | Admit: 2020-09-06 | Discharge: 2020-09-06 | Disposition: A | Discharge status: Home / Self Care | Payer: BC Managed Care – PPO | Source: Ambulatory Visit | Attending: Family Medicine | Admitting: Family Medicine

## 2020-09-06 ENCOUNTER — Telehealth: Payer: Self-pay | Admitting: *Deleted

## 2020-09-06 ENCOUNTER — Encounter (HOSPITAL_COMMUNITY)
Admission: RE | Admit: 2020-09-06 | Discharge: 2020-09-06 | Disposition: A | Discharge status: Home / Self Care | Payer: BC Managed Care – PPO | Source: Ambulatory Visit | Attending: Family Medicine | Admitting: Family Medicine

## 2020-09-06 ENCOUNTER — Ambulatory Visit (HOSPITAL_BASED_OUTPATIENT_CLINIC_OR_DEPARTMENT_OTHER)
Admission: RE | Admit: 2020-09-06 | Discharge: 2020-09-06 | Disposition: A | Discharge status: Home / Self Care | Payer: BC Managed Care – PPO | Source: Ambulatory Visit | Attending: Family Medicine | Admitting: Family Medicine

## 2020-09-06 DIAGNOSIS — R079 Chest pain, unspecified: Secondary | ICD-10-CM

## 2020-09-06 DIAGNOSIS — R0600 Dyspnea, unspecified: Secondary | ICD-10-CM

## 2020-09-06 DIAGNOSIS — R0609 Other forms of dyspnea: Secondary | ICD-10-CM

## 2020-09-06 LAB — ECHOCARDIOGRAM COMPLETE
AR max vel: 3.9 cm2
AV Area VTI: 3.85 cm2
AV Area mean vel: 3.69 cm2
AV Mean grad: 7.8 mmHg
AV Peak grad: 14.6 mmHg
Ao pk vel: 1.91 m/s
Area-P 1/2: 4.8 cm2
S' Lateral: 2.8 cm

## 2020-09-06 LAB — NM MYOCAR MULTI W/SPECT W/WALL MOTION / EF
LV dias vol: 95 mL (ref 62–150)
LV sys vol: 31 mL
Peak HR: 109 {beats}/min
RATE: 0.31
Rest HR: 83 {beats}/min
SDS: 3
SRS: 0
SSS: 3
TID: 1.17

## 2020-09-06 MED ORDER — SODIUM CHLORIDE FLUSH 0.9 % IV SOLN
INTRAVENOUS | Status: AC
  Administered 2020-09-06: 10 mL via INTRAVENOUS
  Filled 2020-09-06: qty 10

## 2020-09-06 MED ORDER — TECHNETIUM TC 99M TETROFOSMIN IV KIT
10.0000 | PACK | Freq: Once | INTRAVENOUS | Status: AC | PRN
  Administered 2020-09-06: 9.99 via INTRAVENOUS

## 2020-09-06 MED ORDER — REGADENOSON 0.4 MG/5ML IV SOLN
INTRAVENOUS | Status: AC
  Administered 2020-09-06: 0.4 mg via INTRAVENOUS
  Filled 2020-09-06: qty 5

## 2020-09-06 MED ORDER — TECHNETIUM TC 99M TETROFOSMIN IV KIT
30.0000 | PACK | Freq: Once | INTRAVENOUS | Status: AC | PRN
  Administered 2020-09-06: 31.6 via INTRAVENOUS

## 2020-09-06 NOTE — Telephone Encounter (Signed)
STRESS TEST -  Laurine Blazer, LPN  6/62/9476 5:46 PM EDT Back to Top    Notified, copy to pcp. Appointment rescheduled from 09/23/2020 to 09/13/2020 with Katina Dung, NP in [place] office.    Laurine Blazer, LPN  04/25/5464 6:81 PM EDT     Left message to return call.   Verta Ellen., NP  09/06/2020 4:44 PM EDT     Please call the patient and let him know there is evidence of prior inferior/inferior septal/inferior apical MI with mild to moderate ischemia around that area. Dr. Harl Bowie deemed this to be an intermediate risk study. Also he mentions the fact that his hemoglobin A1c over the last 3 years was significantly elevated raises the possibility of true early onset of coronary artery disease. Let him know we probably need to get him back in sooner and possibly schedule for cardiac catheterization. Thank you

## 2020-09-06 NOTE — Progress Notes (Signed)
*  PRELIMINARY RESULTS* Echocardiogram 2D Echocardiogram has been performed.  Samuel Germany 09/06/2020, 1:39 PM

## 2020-09-06 NOTE — Telephone Encounter (Signed)
ECHO -   Laurine Blazer, LPN  09/09/9149 5:69 PM EDT Back to Top    Notified, copy to pcp.    Laurine Blazer, LPN  7/94/8016 5:53 PM EDT     Left message to return call.   Verta Ellen., NP  09/06/2020 4:42 PM EDT     Echo shows good pumping function. No leaky valves.Marland Kitchen

## 2020-09-12 NOTE — Progress Notes (Signed)
Cardiology Office Note  Date: 09/13/2020   ID: [name], DOB [birth year], MRN [id number]  PCP:  [name], MD  Cardiologist:  Kate Sable, MD (Inactive) Electrophysiologist:  None   Chief Complaint: Palpitations  History of Present Illness: [name] is a 24 y.o. male with a history of palpitations, tachycardia, DM type II, hypertension  Patient last seen by [name] on 09/08/2018 for palpitations at the request of [name], FNP.  He had been recently evaluated in the emergency room at [facility] 08/26/2018.  He was tachycardic with a heart rate of 121.  He was normotensive with normal oxygen saturations.  Had a reported history of noncompliance with medical therapy.  He had  previously been evaluated in the ED for palpitations on August 10, 2018.  He stated his heart always races but sometimes it feels worse.  His mother described his whole body shaking.  Blood sugar was 106 that morning.  He was having occasional bilateral chest soreness.  He denied any lightheadedness, dizziness, or syncope.  [name] believe the tachycardia and palpitations to be diabetes mediated autonomic neuropathy.  He was taking hydrochlorothiazide for hypertension.  This was discontinued and he was started on carvedilol 3.125 mg p.o. twice daily.  He was being followed by endocrinology for his diabetes.  Recently seen on 08/24/2020 by PCP due to the following complaints: 1 week history of DOE, associated with chest discomfort and palpitations.  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  Given his risk factors and family history of referral was made to consider stress test/TTE/Zio patch.  PFTs were pending.  Presented at last visit with continued complaints of above-mentioned symptoms.  He stated he had noticed considerable issues with increasing dyspnea on exertion and exertional fatigue.  He stated he could  walk from his car to his home and had significant exertional fatigue and dyspnea.  He was helping take care of his 30 year old grandfather and gave out just helping him and had associated  exertional dyspnea and chest discomfort.  He denied any radiation to neck, arm, back, jaw.  Stated he did have excessive sweating.  He stated getting out of bed in the morning and standing up causes him to have palpitations.  Stated his heart rate becomes unusually fast during activity.  EKG today shows normal sinus rhythm rate of 93.  Stated he has had diabetes since the sixth grade.  It has not been well controlled.  Recent lab work on 05/19/2020 showed his random glucose to be 419.  States he had a hemoglobin A1c today but does not know the results.   He presents today for follow-up on his recent stress test and echocardiogram.  He continues with chest discomfort and significant shortness of breath.  We reviewed his stress test and echocardiogram results.  I informed him of his abnormal stress test.  Dr. Harl Bowie included a note regarding the stress test stating abnormal stress test in a young patient of note chart review shows hemoglobin A1c over the last 3 years between 9 and 13% raising possibility for true onset early coronary artery disease.  Echocardiogram showed no significant issues.  See report below.  Patient continues to complain of chest discomfort on mild exertion as well as increasing rapid heart rates.  Also complaining of significant shortness of breath when ambulating or mild to moderate exertional activity.  I reviewed the possibility of the patient needing a cardiac  catheterization if medical therapy is not effective and he continues with symptoms.  We discussed fully the risks and benefits of cardiac catheterization with his mother and he.  They both verbalized understanding.  Past Medical History:  Diagnosis Date  . Diabetes mellitus, type II ([place])   . Heart murmur   . Hypertension     Past Surgical  History:  Procedure Laterality Date  . PALATE / UVULA BIOPSY / EXCISION     "growth removed"  . UPPER GASTROINTESTINAL ENDOSCOPY     Done in [place] - 2013 ish    Current Outpatient Medications  Medication Sig Dispense Refill  . blood glucose meter kit and supplies KIT Dispense based on patient and insurance preference. Use up to four times daily as directed. (FOR ICD-10 E11.65). 1 each 5  . carvedilol (COREG) 25 MG tablet Take 1 tablet (25 mg total) by mouth 2 (two) times daily. 60 tablet 6  . hydrOXYzine (ATARAX/VISTARIL) 50 MG tablet Take 50 mg by mouth 3 (three) times daily as needed.    . Insulin Glargine (LANTUS SOLOSTAR) 100 UNIT/ML Solostar Pen Inject 50 Units into the skin at bedtime. 5 pen 2  . Insulin Pen Needle (BD PEN NEEDLE NANO U/F) 32G X 4 MM MISC 1 each by Does not apply route 4 (four) times daily. 150 each 3  . Lancet Devices (ACCU-CHEK SOFTCLIX) lancets Use as instructed 4 x daily. e11.65 150 each 5  . Lancets (ACCU-CHEK SOFT TOUCH) lancets Use as instructed 100 each 12  . metFORMIN (GLUCOPHAGE) 500 MG tablet Take 500 mg by mouth 2 (two) times daily with a meal.     . metoCLOPramide (REGLAN) 5 MG tablet TAKE 1 TABLET BY MOUTH 2 3 TIMES DAILY BEFORE MEALS    . mirtazapine (REMERON) 30 MG tablet Take 30 mg by mouth at bedtime.     Marland Kitchen omeprazole (PRILOSEC) 20 MG capsule Take 20 mg by mouth daily.   1  . zolpidem (AMBIEN) 10 MG tablet Take 10 mg by mouth at bedtime.    Marland Kitchen aspirin EC 81 MG tablet Take 1 tablet (81 mg total) by mouth daily.    Marland Kitchen atorvastatin (LIPITOR) 10 MG tablet Take 1 tablet (10 mg total) by mouth daily. 30 tablet 6  . isosorbide mononitrate (IMDUR) 30 MG 24 hr tablet Take 1 tablet (30 mg total) by mouth daily. 30 tablet 6  . nitroGLYCERIN (NITROSTAT) 0.4 MG SL tablet Place 1 tablet (0.4 mg total) under the tongue every 5 (five) minutes as needed for chest pain. 25 tablet 3   No current facility-administered medications for this visit.   Allergies:   Amoxicillin   Social History: The patient  reports that he is a non-smoker but has been exposed to tobacco smoke. He has never used smokeless tobacco. He reports that he does not drink alcohol and does not use drugs.   Family History: The patient's family history includes Diabetes in his maternal grandmother; Healthy in his father, mother, sister, and sister; Hypertension in his maternal grandmother; Thyroid disease in his maternal grandmother.   ROS:  Please see the history of present illness. Otherwise, complete review of systems is positive for none.  All other systems are reviewed and negative.   Physical Exam: VS:  BP 110/82   Pulse 96   Ht 6' 3"  (9.379 m)   Wt 238 lb (108 kg)   SpO2 96%   BMI 29.75 kg/m , BMI Body mass index is 29.75 kg/m.  Wt  Readings from Last 3 Encounters:  09/13/20 238 lb (108 kg)  08/26/20 241 lb (109.3 kg)  02/18/19 220 lb 9.6 oz (100.1 kg)    General: Patient appears comfortable at rest. Neck: Supple, no elevated JVP or carotid bruits, no thyromegaly. Lungs: Clear to auscultation, nonlabored breathing at rest. Cardiac: Regular rate and rhythm, no S3 or significant systolic murmur, no pericardial rub. Extremities: No pitting edema, distal pulses 2+. Skin: Warm and dry. Musculoskeletal: No kyphosis. Neuropsychiatric: Alert and oriented x3, affect grossly appropriate.  ECG:  An ECG dated 08/26/2020 was personally reviewed today and demonstrated:  Normal sinus rhythm HR 93  Recent Labwork: No results found for requested labs within last 8760 hours.  No results found for: CHOL, TRIG, HDL, CHOLHDL, VLDL, LDLCALC, LDLDIRECT  Other Studies Reviewed Today:   Nuclear stress test 09/06/2020  Narrative & Impression   There was no ST segment deviation noted during stress.  Findings consistent with prior inferior/inferoseptal/inferoapical myocardial infarction with mild to moderate peri-infarct ischemia. Mild anterior ischemia  This is an intermediate  risk study.  The left ventricular ejection fraction is hyperdynamic (>65%).  Abnormal stress test in young patient, of note chart review shows Hgb A1c over the last 3 years 9-13 raising possibility for true onset of early coronary artery disease.      09/06/2020 Echocardiogram 1. There is mild chordal SAM in the setting of hyperdynamic LV with mild subvalvular gradient of 17 mmHg. No significant LV hypertrophy. . Left ventricular ejection fraction, by estimation, is 65 to 70%. The left ventricle has normal function. The left ventricle has no regional wall motion abnormalities. Left ventricular diastolic parameters were normal. 2. Right ventricular systolic function is normal. The right ventricular size is normal. 3. The mitral valve is normal in structure. No evidence of mitral valve regurgitation. No evidence of mitral stenosis. 4. The aortic valve is tricuspid. Aortic valve regurgitation is not visualized. No aortic stenosis is present. 5. The inferior vena cava is normal in size with greater than 50% respiratory variability, suggesting right atrial pressure of 3 mmHg.  Assessment and Plan:    1. DOE (dyspnea on exertion) Patient states he is having progressively worsening dyspnea on exertion recently.  He states recently that just walking from his car to his home causes significant dyspnea on exertion and significant exertional fatigue with some chest discomfort.  Echocardiogram 09/06/2020 showed mild chordal S.A.M. in setting of hyperdynamic LV with mild subvalvular gradient of 17 mmHg.  No LV hypertrophy.  LVEF by estimation 65 to 70%.     2. Chest discomfort As mentioned above recent increase in chest discomfort with exertion with associated dyspnea on exertion.  Denies any radiation to neck, arm, jaw, or back.  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  This was considered an intermediate risk study.  Start aspirin 81 mg daily.  Start Imdur 30 mg p.o. daily.  If symptoms persist at follow-up we will schedule a cardiac catheterization.  The risk and benefits of cardiac catheterization were discussed thoroughly with mother and patient.  Both verbalized understanding.   3. Tachycardia Previous history of tachycardia.  Last seen by [name] who stated tachycardia was likely mediated by autonomic dysfunction from diabetes.  Patient states he becomes tachycardic just getting up from from bed and walking.  Continue carvedilol 25 mg p.o. twice daily.   4. Palpitations Patient states he needs having palpitations at least  4-5 times a day sometimes associated with exertion sometimes without.  States his heart rate goes relatively fast when exerting.  Patient still has his ZIO monitor on.  We will follow-up in 1 month to recheck symptoms and follow-up on ZIO monitor results.   5. Essential hypertension, benign Blood pressure is elevated today on arrival.  Recent visit to [facility] on 08/24/2020 his blood pressure was 118/72.  6. Insulin dependent type 2 diabetes mellitus ([place]) Significant history of diabetes type 2 since grade 6.  Not well controlled.  Recent random fasting glucose level in May 2021 03/24/2017.  He follows with PCP for diabetes management.  Start atorvastatin 10 mg p.o. daily in setting of diabetes. Patient needs closer follow-up on his diabetes.  He states his recent hemoglobin A1c was 13.5.  He states he used to see Dr. Dorris Fetch in [place] but was never called back to his office for follow-up.  We will refer patient to [facility] endocrinology in [place] to have  an endocrinologist there follow the patient more closely   Medication Adjustments/Labs and Tests Ordered: Current medicines are reviewed at length with the patient today.  Concerns regarding medicines are outlined above.   Disposition: Follow-up  with Dr. Harl Bowie or APP 1 month Signed, Levell July, NP 09/13/2020 3:28 PM    [facility] at [place], [place], [place] [address] Phone: [phone]; Fax: [phone]

## 2020-09-13 ENCOUNTER — Ambulatory Visit (INDEPENDENT_AMBULATORY_CARE_PROVIDER_SITE_OTHER): Payer: BC Managed Care – PPO | Admitting: Family Medicine

## 2020-09-13 ENCOUNTER — Encounter: Payer: Self-pay | Admitting: Family Medicine

## 2020-09-13 VITALS — BP 110/82 | HR 96 | Ht 75.0 in | Wt 238.0 lb

## 2020-09-13 DIAGNOSIS — Z794 Long term (current) use of insulin: Secondary | ICD-10-CM | POA: Diagnosis not present

## 2020-09-13 DIAGNOSIS — E119 Type 2 diabetes mellitus without complications: Secondary | ICD-10-CM | POA: Diagnosis not present

## 2020-09-13 MED ORDER — ISOSORBIDE MONONITRATE ER 30 MG PO TB24: 30.0000 mg | ORAL_TABLET | Freq: Every day | ORAL | 6 refills | Status: DC

## 2020-09-13 MED ORDER — NITROGLYCERIN 0.4 MG SL SUBL: 0.4000 mg | SUBLINGUAL_TABLET | SUBLINGUAL | 3 refills | Status: AC | PRN

## 2020-09-13 MED ORDER — ATORVASTATIN CALCIUM 10 MG PO TABS: 10.0000 mg | ORAL_TABLET | Freq: Every day | ORAL | 6 refills | Status: DC

## 2020-09-13 MED ORDER — ASPIRIN EC 81 MG PO TBEC: 81.0000 mg | DELAYED_RELEASE_TABLET | Freq: Every day | ORAL | Status: AC

## 2020-09-13 NOTE — Patient Instructions (Signed)
Medication Instructions:   Begin the following:  - Aspirin 81mg  daily.  - Imdur 30mg  daily.   - Atorvastatin 10mg  daily.   - Nitroglycerin as needed for chest pain.   Continue all other medications.    Labwork: none  Testing/Procedures: none  Follow-Up: 1 month   Any Other Special Instructions Will Be Listed Below (If Applicable). You have been referred to:  Endocrinology   If you need a refill on your cardiac medications before your next appointment, please call your pharmacy.

## 2020-09-23 ENCOUNTER — Ambulatory Visit: Payer: BC Managed Care – PPO | Admitting: Family Medicine

## 2020-10-10 NOTE — Progress Notes (Signed)
Cardiology Office Note  Date: 10/10/2020   ID: [name], DOB 1996-12-05, MRN [id number]  PCP:  [name], MD  Cardiologist:  No primary care provider on file. Electrophysiologist:  None   Chief Complaint: CP, DOE, Palpitations  History of Present Illness: [name] is a 24 y.o. male with a history of palpitations, tachycardia, DM type II, hypertension  Patient last seen by [name] on 09/08/2018 for palpitations at the request of Lucas Anchors, FNP.  He had been recently evaluated in the emergency room at [facility] 08/26/2018.  He was tachycardic with a heart rate of 121.  He was normotensive with normal oxygen saturations.  Had a reported history of noncompliance with medical therapy.  He had  previously been evaluated in the ED for palpitations on August 10, 2018.  He stated his heart always races but sometimes it feels worse.  His mother described his whole body shaking.  Blood sugar was 106 that morning.  He was having occasional bilateral chest soreness.  He denied any lightheadedness, dizziness, or syncope.  [name] believe the tachycardia and palpitations to be diabetes mediated autonomic neuropathy.  He was taking hydrochlorothiazide for hypertension.  This was discontinued and he was started on carvedilol 3.125 mg p.o. twice daily.  He was being followed by endocrinology for his diabetes.  Recently seen on 08/24/2020 by PCP due to the following complaints: 1 week history of DOE, associated with chest discomfort and palpitations.  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  Given his risk factors and family history, a referral was made to consider stress test/TTE/Zio patch.  PFTs were pending.  Previous visit visit with continued complaints of above-mentioned symptoms.  He stated he had noticed considerable issues with increasing dyspnea on exertion and exertional fatigue.  He stated  he could walk from his car to his home and had significant exertional fatigue and dyspnea.  He was helping take care of his 33 year old grandfather and gave out just helping him and had associated  exertional dyspnea and chest discomfort.  He denied any radiation to neck, arm, back, jaw.  Stated he did have excessive sweating.  He stated getting out of bed in the morning and standing up causes him to have palpitations.  Stated his heart rate becomes unusually fast during activity.  EKG today shows normal sinus rhythm rate of 93.  Stated he has had diabetes since the sixth grade.  It has not been well controlled.  Recent lab work on 05/19/2020 showed his random glucose to be 419.  States he had a hemoglobin A1c today but does not know the results.   Presented last visit for follow-up on his recent stress test and echocardiogram.  He continued with chest discomfort and significant shortness of breath.  We reviewed his stress test and echocardiogram results.  I informed him of his abnormal stress test.  Dr. Harl Bowie included a note regarding the stress test stating abnormal stress test in a young patient of note chart review shows hemoglobin A1c over the last 3 years between 9 and 13% raising possibility for true onset early coronary artery disease.  Echocardiogram showed no significant issues.  See report below.  He continued to complain of chest discomfort on mild exertion as well as increasing rapid heart rates.  Also complained of significant shortness of breath when ambulating or mild to moderate exertional activity.  I reviewed the possibility of the patient  needing a cardiac catheterization if medical therapy is not effective and he continues with symptoms.  We discussed fully the risks and benefits of cardiac catheterization with his mother and he.  They both verbalized understanding.   He presents today for review of his cardiac monitoring test as well as rereview of his stress and echo results.  States his  chest discomfort is better as well as his shortness of breath.  He still has some shortness of breath but not as severe as before.  States he recently started Victoza for his diabetes for better control.  States his sugars are beginning to decrease to below 200.  States recent blood sugar was 180 which is better for him.  He denies any PND, orthopnea, DVT or PE-like symptoms, lower extremity edema, claudication, lightheadedness, dizziness, palpitations or arrhythmias, CVA or TIA-like symptoms.  He will be seeing a new endocrinologist soon in [name].  Past Medical History:  Diagnosis Date  . Diabetes mellitus, type II ([place])   . Heart murmur   . Hypertension     Past Surgical History:  Procedure Laterality Date  . PALATE / UVULA BIOPSY / EXCISION     "growth removed"  . UPPER GASTROINTESTINAL ENDOSCOPY     Done in [facility] - 2013 ish    Current Outpatient Medications  Medication Sig Dispense Refill  . aspirin EC 81 MG tablet Take 1 tablet (81 mg total) by mouth daily.    Marland Kitchen atorvastatin (LIPITOR) 10 MG tablet Take 1 tablet (10 mg total) by mouth daily. 30 tablet 6  . blood glucose meter kit and supplies KIT Dispense based on patient and insurance preference. Use up to four times daily as directed. (FOR ICD-10 E11.65). 1 each 5  . carvedilol (COREG) 25 MG tablet Take 1 tablet (25 mg total) by mouth 2 (two) times daily. 60 tablet 6  . hydrOXYzine (ATARAX/VISTARIL) 50 MG tablet Take 50 mg by mouth 3 (three) times daily as needed.    . Insulin Glargine (LANTUS SOLOSTAR) 100 UNIT/ML Solostar Pen Inject 50 Units into the skin at bedtime. 5 pen 2  . Insulin Pen Needle (BD PEN NEEDLE NANO U/F) 32G X 4 MM MISC 1 each by Does not apply route 4 (four) times daily. 150 each 3  . isosorbide mononitrate (IMDUR) 30 MG 24 hr tablet Take 1 tablet (30 mg total) by mouth daily. 30 tablet 6  . Lancet Devices (ACCU-CHEK SOFTCLIX) lancets Use as instructed 4 x daily. e11.65 150 each 5  . Lancets (ACCU-CHEK  SOFT TOUCH) lancets Use as instructed 100 each 12  . metFORMIN (GLUCOPHAGE) 500 MG tablet Take 500 mg by mouth 2 (two) times daily with a meal.     . metoCLOPramide (REGLAN) 5 MG tablet TAKE 1 TABLET BY MOUTH 2 3 TIMES DAILY BEFORE MEALS    . mirtazapine (REMERON) 30 MG tablet Take 30 mg by mouth at bedtime.     . nitroGLYCERIN (NITROSTAT) 0.4 MG SL tablet Place 1 tablet (0.4 mg total) under the tongue every 5 (five) minutes as needed for chest pain. 25 tablet 3  . omeprazole (PRILOSEC) 20 MG capsule Take 20 mg by mouth daily.   1  . zolpidem (AMBIEN) 10 MG tablet Take 10 mg by mouth at bedtime.     No current facility-administered medications for this visit.   Allergies:  Amoxicillin   Social History: The patient  reports that he is a non-smoker but has been exposed to tobacco smoke. He has never  used smokeless tobacco. He reports that he does not drink alcohol and does not use drugs.   Family History: The patient's family history includes Diabetes in his maternal grandmother; Healthy in his father, mother, sister, and sister; Hypertension in his maternal grandmother; Thyroid disease in his maternal grandmother.   ROS:  Please see the history of present illness. Otherwise, complete review of systems is positive for none.  All other systems are reviewed and negative.   Physical Exam: VS:  There were no vitals taken for this visit., BMI There is no height or weight on file to calculate BMI.  Wt Readings from Last 3 Encounters:  09/13/20 238 lb (108 kg)  08/26/20 241 lb (109.3 kg)  02/18/19 220 lb 9.6 oz (100.1 kg)    General: Patient appears comfortable at rest. Neck: Supple, no elevated JVP or carotid bruits, no thyromegaly. Lungs: Clear to auscultation, nonlabored breathing at rest. Cardiac: Regular rate and rhythm, no S3 or significant systolic murmur, no pericardial rub. Extremities: No pitting edema, distal pulses 2+. Skin: Warm and dry. Musculoskeletal: No  kyphosis. Neuropsychiatric: Alert and oriented x3, affect grossly appropriate.  ECG:  An ECG dated 08/26/2020 was personally reviewed today and demonstrated:  Normal sinus rhythm HR 93  Recent Labwork: No results found for requested labs within last 8760 hours.  No results found for: CHOL, TRIG, HDL, CHOLHDL, VLDL, LDLCALC, LDLDIRECT  Other Studies Reviewed Today:   Nuclear stress test 09/06/2020  Narrative & Impression   There was no ST segment deviation noted during stress.  Findings consistent with prior inferior/inferoseptal/inferoapical myocardial infarction with mild to moderate peri-infarct ischemia. Mild anterior ischemia  This is an intermediate risk study.  The left ventricular ejection fraction is hyperdynamic (>65%).  Abnormal stress test in young patient, of note chart review shows Hgb A1c over the last 3 years 9-13 raising possibility for true onset of early coronary artery disease.      09/06/2020 Echocardiogram 1. There is mild chordal SAM in the setting of hyperdynamic LV with mild subvalvular gradient of 17 mmHg. No significant LV hypertrophy. . Left ventricular ejection fraction, by estimation, is 65 to 70%. The left ventricle has normal function. The left ventricle has no regional wall motion abnormalities. Left ventricular diastolic parameters were normal. 2. Right ventricular systolic function is normal. The right ventricular size is normal. 3. The mitral valve is normal in structure. No evidence of mitral valve regurgitation. No evidence of mitral stenosis. 4. The aortic valve is tricuspid. Aortic valve regurgitation is not visualized. No aortic stenosis is present. 5. The inferior vena cava is normal in size with greater than 50% respiratory variability, suggesting right atrial pressure of 3 mmHg.  Assessment and Plan:    1. DOE (dyspnea on exertion) He presents today stating his shortness of breath is better since starting the Imdur and continuing  the carvedilol.  Echocardiogram 09/06/2020 showed mild chordal S.A.M. in setting of hyperdynamic LV with mild subvalvular gradient of 17 mmHg.  No LV hypertrophy.  LVEF by estimation 65 to 70%.     2. Chest discomfort Presents today with no real complaints of chest pain since starting Imdur.  Stating he is having some minor shortness of breath but not as bad as previously.  Nuclear stress test was consistent with prior inferior/inferior septal/inferior apical myocardial infarction with mild to moderate peri-infarct ischemia, mild anterior ischemia.  This was considered an intermediate risk study.  Continue aspirin 81 mg daily.  Continue Imdur 30 mg p.o. daily.  Continue nitroglycerin sublingual as needed.  3. Tachycardia Previous history of tachycardia.  Previously seen by [name] who stated tachycardia was likely mediated by autonomic dysfunction from diabetes.  Patient's heart rate today is 97.  Continue carvedilol 25 mg p.o. twice daily.   4. Palpitations Patient stated he previously was having palpitations at least 4-5 times a day sometimes associated with exertion sometimes without.  States his heart rate goes relatively fast when exerting.    Recent cardiac monitor showed no significant tacky or bradycardia arrhythmias.  Reviewed results with patient.  Continue carvedilol 25 mg p.o. twice daily.  5. Essential hypertension, benign Blood pressure is much better today at 128/86.  Heart rate is 97.  Continue carvedilol 25 mg p.o. twice daily.  6. Insulin dependent type 2 diabetes mellitus ([place]) Significant history of diabetes type 2 since grade 6.  Not well controlled.  Recent random fasting glucose level in May 2021 03/24/2017.  He follows with PCP for diabetes management.  Continue atorvastatin 10 mg p.o. daily in setting of diabetes. Patient needs closer follow-up on his diabetes.  He states his recent hemoglobin A1c was 13.5.  States he was recently started on Victoza by PCP.  He has a  pending appointment with endocrinology in [place].  Medication Adjustments/Labs and Tests Ordered: Current medicines are reviewed at length with the patient today.  Concerns regarding medicines are outlined above.   Disposition: Follow-up with Dr. Harl Bowie or APP 6 months. Signed, Levell July, NP 10/10/2020 11:03 PM    [facility] [place], [place], [place] [address] Phone: [phone]; Fax: [phone]

## 2020-10-11 ENCOUNTER — Encounter: Payer: Self-pay | Admitting: Family Medicine

## 2020-10-11 ENCOUNTER — Ambulatory Visit (INDEPENDENT_AMBULATORY_CARE_PROVIDER_SITE_OTHER): Payer: BC Managed Care – PPO | Admitting: Family Medicine

## 2020-10-11 VITALS — BP 128/86 | HR 97 | Ht 75.0 in | Wt 239.2 lb

## 2020-10-11 DIAGNOSIS — R0789 Other chest pain: Secondary | ICD-10-CM | POA: Diagnosis not present

## 2020-10-11 DIAGNOSIS — R000 Tachycardia, unspecified: Secondary | ICD-10-CM

## 2020-10-11 DIAGNOSIS — I10 Essential (primary) hypertension: Secondary | ICD-10-CM

## 2020-10-11 DIAGNOSIS — R0600 Dyspnea, unspecified: Secondary | ICD-10-CM

## 2020-10-11 DIAGNOSIS — R002 Palpitations: Secondary | ICD-10-CM

## 2020-10-11 DIAGNOSIS — R0609 Other forms of dyspnea: Secondary | ICD-10-CM

## 2020-10-11 DIAGNOSIS — Z794 Long term (current) use of insulin: Secondary | ICD-10-CM

## 2020-10-11 DIAGNOSIS — E119 Type 2 diabetes mellitus without complications: Secondary | ICD-10-CM

## 2020-10-11 NOTE — Patient Instructions (Signed)
Your physician wants you to follow-up in: 6 MONTHS WITH MD You will receive a reminder letter in the mail two months in advance. If you don't receive a letter, please call our office to schedule the follow-up appointment.  Your physician recommends that you continue on your current medications as directed. Please refer to the Current Medication list given to you today.  Thank you for choosing [facility]!!    

## 2020-10-18 ENCOUNTER — Encounter: Payer: Self-pay | Admitting: Internal Medicine

## 2020-10-18 ENCOUNTER — Other Ambulatory Visit: Payer: Self-pay

## 2020-10-18 ENCOUNTER — Ambulatory Visit (INDEPENDENT_AMBULATORY_CARE_PROVIDER_SITE_OTHER): Payer: BC Managed Care – PPO | Admitting: Internal Medicine

## 2020-10-18 VITALS — BP 132/92 | HR 104 | Ht 75.0 in | Wt 235.6 lb

## 2020-10-18 DIAGNOSIS — IMO0002 Reserved for concepts with insufficient information to code with codable children: Secondary | ICD-10-CM

## 2020-10-18 DIAGNOSIS — E1165 Type 2 diabetes mellitus with hyperglycemia: Secondary | ICD-10-CM | POA: Diagnosis not present

## 2020-10-18 DIAGNOSIS — E1143 Type 2 diabetes mellitus with diabetic autonomic (poly)neuropathy: Secondary | ICD-10-CM

## 2020-10-18 DIAGNOSIS — Z794 Long term (current) use of insulin: Secondary | ICD-10-CM

## 2020-10-18 LAB — POCT GLYCOSYLATED HEMOGLOBIN (HGB A1C): Hemoglobin A1C: 11.4 % — AB (ref 4.0–5.6)

## 2020-10-18 MED ORDER — HUMALOG KWIKPEN 200 UNIT/ML ~~LOC~~ SOPN: PEN_INJECTOR | SUBCUTANEOUS | 3 refills | Status: DC

## 2020-10-18 MED ORDER — LANTUS SOLOSTAR 100 UNIT/ML ~~LOC~~ SOPN
35.0000 [IU] | PEN_INJECTOR | Freq: Two times a day (BID) | SUBCUTANEOUS | 3 refills | Status: DC
Start: 2020-10-18 — End: 2022-03-27

## 2020-10-18 MED ORDER — CARETOUCH PEN NEEDLES 31G X 6 MM MISC: 3 refills | Status: DC

## 2020-10-18 MED ORDER — OZEMPIC (0.25 OR 0.5 MG/DOSE) 2 MG/1.5ML ~~LOC~~ SOPN: 0.5000 mg | PEN_INJECTOR | SUBCUTANEOUS | 3 refills | Status: DC

## 2020-10-18 NOTE — Addendum Note (Signed)
Addended by: Caprice Beaver T on: 10/18/2020 10:01 AM   Modules accepted: Orders

## 2020-10-18 NOTE — Progress Notes (Signed)
Patient ID: [name], male   DOB: [birth year], 24 y.o.   MRN: [id number]   This visit occurred during the SARS-CoV-2 public health emergency.  Safety protocols were in place, including screening questions prior to the visit, additional usage of staff PPE, and extensive cleaning of exam room while observing appropriate contact time as indicated for disinfecting solutions.   HPI: [name] is a 24 y.o.-year-old male, referred by his PCP, Dr. Angelia Mould, for management of DM, dx at 24 years old, insulin-dependent since 24 y/o, uncontrolled, with complications (autonomic neuropathy, PN, microalbuminuria, DR).  She saw Dr. Dorris Fetch in the past.  Reviewed HbA1c: 08/2020: HbA1c 12.1% 04/2020: HbA1c 11.5% Lab Results  Component Value Date   HGBA1C 9.7 (H) 03/17/2019   HGBA1C 8.6 (H) 11/18/2018   HGBA1C 12.5 08/14/2018   HGBA1C 13 02/11/2017   Pt is on a regimen of: - Metformin ER 500 mg 2x a day, with meals - Victoza 1.2 mg daily in am - started by PCP on 09/28/2020 - no nausea - Lantus 77 units at bedtime >> split 35 units 2x a day on 09/28/2020 He was on Humalog 7-7-9 units before meals  - stopped when started Victoza Metformin IR >> nausea.  Pt checks his sugars 2-3x a day and they are slightly better after starting Victoza: - am: 300-317 - 2h after b'fast: n/c - before lunch: 250 - 2h after lunch: n/c - before dinner: 250 - 2h after dinner: n/c - bedtime: 200-310 - nighttime: n/c Lowest sugar was 200; ?  At which level he has hypoglycemia awareness. Highest sugar was 350.  Glucometer: Accu-Chek >> ReliOn   Pt's meals are: - Breakfast: if eats b'fast: cornflakes + milk - Lunch: half a sandwich - Dinner: meat + veggies + starch - home cooked - Snacks: not usually He drinks 1.5-2 sweet tea a day.  - no CKD but he has a history of microalbuminuria, last BUN/creatinine:  Lab Results  Component Value Date   BUN 12 03/17/2019   BUN 12 11/19/2018   CREATININE 0.84  03/17/2019   CREATININE 0.70 11/19/2018   ACR: 10/19/2015: ACR 78.8 07/28/2012: ACR 41.8 Not on ACE inhibitor/ARB.  -+ HL; last set of lipids: 10/19/2015: 202/110/63/117 No results found for: CHOL, HDL, LDLCALC, LDLDIRECT, TRIG, CHOLHDL On Lipitor 10.  - last eye exam was in Spring 2021. + DR reportedly.  - + numbness and tingling in his feet.  Prev. On Reglan. On ASA 81.  Pt has FH of DM in MGM, MGGM, PGF - prediabetes.  She also has a history of HTN, cardiac murmur.  He had tachycardia and palpitations due to autoimmune.  He was started on carvedilol after an ED visit in 2019.  He continues to have shortness of breath and exertional fatigue. Stress test, 2D Echo and heart monitor >> recently normal.  TSH was normal. Lab Results  Component Value Date   TSH 0.62 03/17/2019   He is unemployed.  He walks for exercise but only can walk for 15 to 20 minutes because of shortness of breath.  ROS: Constitutional: no weight gain, + weight loss, + fatigue, no subjective hyperthermia, no subjective hypothermia, no nocturia, + poor sleep Eyes: + blurry vision, no xerophthalmia ENT: no sore throat, no nodules palpated in neck, no dysphagia, no odynophagia, no hoarseness, no tinnitus, no hypoacusis Cardiovascular: + CP, + SOB, + palpitations, no leg swelling Respiratory: + cough, + SOB, no wheezing Gastrointestinal: no N, no V, no D, no C, +  acid reflux Musculoskeletal: + muscle, + joint aches Skin: + rash  - macular, on legs, + hair loss, + easy bruising Neurological: + tremors, no numbness or tingling/no dizziness/+ HAs Psychiatric: no depression, + anxiety + Difficulty with erections  Past Medical History:  Diagnosis Date  . Diabetes mellitus, type II ([place])   . Heart murmur   . Hypertension    Past Surgical History:  Procedure Laterality Date  . PALATE / UVULA BIOPSY / EXCISION     "growth removed"  . UPPER GASTROINTESTINAL ENDOSCOPY     Done in [place] - 2013 ish    Social History   Socioeconomic History  . Marital status: Single    Spouse name: Not on file  . Number of children: Not on file  . Years of education: Not on file  . Highest education level: Not on file  Occupational History  . Not on file  Tobacco Use  . Smoking status: Passive Smoke Exposure - Never Smoker  . Smokeless tobacco: Never Used  Vaping Use  . Vaping Use: Never used  Substance and Sexual Activity  . Alcohol use: No  . Drug use: No  . Sexual activity: Not on file  Other Topics Concern  . Not on file  Social History Narrative  . Not on file   Social Determinants of Health   Financial Resource Strain:   . Difficulty of Paying Living Expenses: Not on file  Food Insecurity:   . Worried About Charity fundraiser in the Last Year: Not on file  . Ran Out of Food in the Last Year: Not on file  Transportation Needs:   . Lack of Transportation (Medical): Not on file  . Lack of Transportation (Non-Medical): Not on file  Physical Activity:   . Days of Exercise per Week: Not on file  . Minutes of Exercise per Session: Not on file  Stress:   . Feeling of Stress : Not on file  Social Connections:   . Frequency of Communication with Friends and Family: Not on file  . Frequency of Social Gatherings with Friends and Family: Not on file  . Attends Religious Services: Not on file  . Active Member of Clubs or Organizations: Not on file  . Attends Archivist Meetings: Not on file  . Marital Status: Not on file  Intimate Partner Violence:   . Fear of Current or Ex-Partner: Not on file  . Emotionally Abused: Not on file  . Physically Abused: Not on file  . Sexually Abused: Not on file   Current Outpatient Medications on File Prior to Visit  Medication Sig Dispense Refill  . aspirin EC 81 MG tablet Take 1 tablet (81 mg total) by mouth daily.    Marland Kitchen atorvastatin (LIPITOR) 10 MG tablet Take 1 tablet (10 mg total) by mouth daily. 30 tablet 6  . blood glucose meter  kit and supplies KIT Dispense based on patient and insurance preference. Use up to four times daily as directed. (FOR ICD-10 E11.65). 1 each 5  . carvedilol (COREG) 25 MG tablet Take 1 tablet (25 mg total) by mouth 2 (two) times daily. 60 tablet 6  . hydrOXYzine (ATARAX/VISTARIL) 50 MG tablet Take 50 mg by mouth 3 (three) times daily as needed.    . Insulin Glargine (LANTUS SOLOSTAR) 100 UNIT/ML Solostar Pen Inject 50 Units into the skin at bedtime. 5 pen 2  . Insulin Pen Needle (BD PEN NEEDLE NANO U/F) 32G X 4 MM MISC 1  each by Does not apply route 4 (four) times daily. 150 each 3  . isosorbide mononitrate (IMDUR) 30 MG 24 hr tablet Take 1 tablet (30 mg total) by mouth daily. 30 tablet 6  . Lancet Devices (ACCU-CHEK SOFTCLIX) lancets Use as instructed 4 x daily. e11.65 150 each 5  . Lancets (ACCU-CHEK SOFT TOUCH) lancets Use as instructed 100 each 12  . metFORMIN (GLUCOPHAGE) 500 MG tablet Take 500 mg by mouth 2 (two) times daily with a meal.     . metoCLOPramide (REGLAN) 5 MG tablet TAKE 1 TABLET BY MOUTH 2 3 TIMES DAILY BEFORE MEALS    . mirtazapine (REMERON) 30 MG tablet Take 30 mg by mouth at bedtime.     . nitroGLYCERIN (NITROSTAT) 0.4 MG SL tablet Place 1 tablet (0.4 mg total) under the tongue every 5 (five) minutes as needed for chest pain. 25 tablet 3  . omeprazole (PRILOSEC) 20 MG capsule Take 20 mg by mouth daily.   1  . VICTOZA 18 MG/3ML SOPN Inject 1.2 mg into the skin daily.    Marland Kitchen zolpidem (AMBIEN) 10 MG tablet Take 10 mg by mouth at bedtime.     No current facility-administered medications on file prior to visit.   Allergies  Allergen Reactions  . Amoxicillin Hives    Has patient had a PCN reaction causing immediate rash, facial/tongue/throat swelling, SOB or lightheadedness with hypotension: No Has patient had a PCN reaction causing severe rash involving mucus membranes or skin necrosis: No Has patient had a PCN reaction that required hospitalization: No Has patient had a PCN  reaction occurring within the last 10 years: No If all of the above answers are "NO", then may proceed with Cephalosporin use.    Family History  Problem Relation Age of Onset  . Diabetes Maternal Grandmother   . Thyroid disease Maternal Grandmother   . Hypertension Maternal Grandmother   . Healthy Mother   . Healthy Father   . Healthy Sister   . Healthy Sister     PE: BP (!) 132/92   Pulse (!) 104   Ht 6' 3"  (1.905 m)   Wt 235 lb 9.6 oz (106.9 kg)   SpO2 98%   BMI 29.45 kg/m  Wt Readings from Last 3 Encounters:  10/18/20 235 lb 9.6 oz (106.9 kg)  10/11/20 239 lb 3.2 oz (108.5 kg)  09/13/20 238 lb (108 kg)   Constitutional: overweight, in NAD Eyes: PERRLA, EOMI, no exophthalmos ENT: moist mucous membranes, no thyromegaly, no cervical lymphadenopathy Cardiovascular: Tachycardia, RR, No MRG Respiratory: CTA B Gastrointestinal: abdomen soft, NT, ND, BS+ Musculoskeletal: no deformities, strength intact in all 4 Skin: moist, warm, + macular rash with coagulated blood on bilateral shins and feet Neurological: no tremor with outstretched hands, DTR normal in all 4  ASSESSMENT: 1. DM2, insulin-dependent, uncontrolled, with complications - DR - Autonomic neuropathy - PN - Microalbuminuria   PLAN:  1. Patient with long-standing, uncontrolled diabetes, on oral antidiabetic regimen and also basal/bolus insulin regimen, which became insufficient.  Latest HbA1c was 12.1% last month. Today: HbA1c slightly lower, 11.4%. -Patient was recently seen by PCP (almost 3 weeks ago) and at that time, Lantus was placed into 2 doses a day, Humalog was stopped and Victoza was added.  He tolerates Victoza well, at 1.2 mg daily.  We discussed about trying to switch to weekly GLP-1 receptor agonist and I sent a prescription for 0.5 mg of Ozempic to his pharmacy.  However, if this is not covered, we  will go up on the Victoza dose to 1.8 mg daily.  I also agree with splitting the Lantus dose and we  will continue with the current long-acting insulin regimen.  However, with his sugars in the morning staying in the 300s and only improving slightly later in the day, he definitely needs mealtime insulin.  We discussed about adding back Humalog at 10 to 14 units before each meal.  Since he is tolerating the Metformin ER well, will increase the dose to target dose of 1000 mg twice a day.  He agrees with this plan. -We also discussed about the importance of improving his diet.  He usually eats 1 large meal a day: Dinner, usually skips breakfast and had a small lunch.  He agrees with referral to nutrition.  He did previously see a nutritionist associated with his previous endocrinologist.  -Regarding his macular rash on his shins, I recommended that he sees wound care for these, especially since he mentions that these are not healing.  They do not appear infected. - I suggested to:  Patient Instructions  Please increase: - Metformin ER 1000 mg 2x a day, with meals - Victoza 1.8 mg daily in am (if Ozempic is covered, start this instead: 0.5 mg weekly)  Please continue: - Lantus 35 units 2x a day  Please restart: - Humalog 10-14 units 15 min before meals  Please let me know if the sugars are consistently <80 or >200.  Please return in 1.5 months with your sugar log.   - Strongly advised him to start checking sugars at different times of the day - check 3x a day, rotating checks - discussed about CBG targets for treatment: 80-130 mg/dL before meals and <180 mg/dL after meals; target HbA1c <7%. - given sugar log and advised how to fill it and to bring it at next appt  - given foot care handout and explained the principles  - given instructions for hypoglycemia management "15-15 rule"  - advised for yearly eye exams  - Return to clinic in 1.5 mo with sugar log   Philemon Kingdom, MD PhD [facility] Endocrinology

## 2020-10-18 NOTE — Patient Instructions (Addendum)
Please increase: - Metformin ER 1000 mg 2x a day, with meals - Victoza 1.8 mg daily in am (if Ozempic is covered, start this instead: 0.5 mg weekly)  Please continue: - Lantus 35 units 2x a day  Please restart: - Humalog 10-14 units 15 min before meals  Please let me know if the sugars are consistently <80 or >200.  Please return in 1.5 months with your sugar log.   PATIENT INSTRUCTIONS FOR TYPE 2 DIABETES:  DIET AND EXERCISE Diet and exercise is an important part of diabetic treatment.  We recommended aerobic exercise in the form of brisk walking (working between 40-60% of maximal aerobic capacity, similar to brisk walking) for 150 minutes per week (such as 30 minutes five days per week) along with 3 times per week performing 'resistance' training (using various gauge rubber tubes with handles) 5-10 exercises involving the major muscle groups (upper body, lower body and core) performing 10-15 repetitions (or near fatigue) each exercise. Start at half the above goal but build slowly to reach the above goals. If limited by weight, joint pain, or disability, we recommend daily walking in a swimming pool with water up to waist to reduce pressure from joints while allow for adequate exercise.    BLOOD GLUCOSES Monitoring your blood glucoses is important for continued management of your diabetes. Please check your blood glucoses 2-4 times a day: fasting, before meals and at bedtime (you can rotate these measurements - e.g. one day check before the 3 meals, the next day check before 2 of the meals and before bedtime, etc.).   HYPOGLYCEMIA (low blood sugar) Hypoglycemia is usually a reaction to not eating, exercising, or taking too much insulin/ other diabetes drugs.  Symptoms include tremors, sweating, hunger, confusion, headache, etc. Treat IMMEDIATELY with 15 grams of Carbs: . 4 glucose tablets .  cup regular juice/soda . 2 tablespoons raisins . 4 teaspoons sugar . 1 tablespoon  honey Recheck blood glucose in 15 mins and repeat above if still symptomatic/blood glucose <100.  RECOMMENDATIONS TO REDUCE YOUR RISK OF DIABETIC COMPLICATIONS: * Take your prescribed MEDICATION(S) * Follow a DIABETIC diet: Complex carbs, fiber rich foods, (monounsaturated and polyunsaturated) fats * AVOID saturated/trans fats, high fat foods, >2,300 mg salt per day. * EXERCISE at least 5 times a week for 30 minutes or preferably daily.  * DO NOT SMOKE OR DRINK more than 1 drink a day. * Check your FEET every day. Do not wear tightfitting shoes. Contact us if you develop an ulcer * See your EYE doctor once a year or more if needed * Get a FLU shot once a year * Get a PNEUMONIA vaccine once before and once after age 29 years  GOALS:  * Your Hemoglobin A1c of <7%  * fasting sugars need to be <130 * after meals sugars need to be <180 (2h after you start eating) * Your Systolic BP should be 161 or lower  * Your Diastolic BP should be 80 or lower  * Your HDL (Good Cholesterol) should be 40 or higher  * Your LDL (Bad Cholesterol) should be 100 or lower. * Your Triglycerides should be 150 or lower  * Your Urine microalbumin (kidney function) should be <30 * Your Body Mass Index should be 25 or lower    Please consider the following ways to cut down carbs and fat and increase fiber and micronutrients in your diet: - substitute whole grain for white bread or pasta - substitute brown rice for white  rice - substitute 90-calorie flat bread pieces for slices of bread when possible - substitute sweet potatoes or yams for white potatoes - substitute humus for margarine - substitute tofu for cheese when possible - substitute almond or rice milk for regular milk (would not drink soy milk daily due to concern for soy estrogen influence on breast cancer risk) - substitute dark chocolate for other sweets when possible - substitute water - can add lemon or orange slices for taste - for diet sodas  (artificial sweeteners will trick your body that you can eat sweets without getting calories and will lead you to overeating and weight gain in the long run) - do not skip breakfast or other meals (this will slow down the metabolism and will result in more weight gain over time)  - can try smoothies made from fruit and almond/rice milk in am instead of regular breakfast - can also try old-fashioned (not instant) oatmeal made with almond/rice milk in am - order the dressing on the side when eating salad at a restaurant (pour less than half of the dressing on the salad) - eat as little meat as possible - can try juicing, but should not forget that juicing will get rid of the fiber, so would alternate with eating raw veg./fruits or drinking smoothies - use as little oil as possible, even when using olive oil - can dress a salad with a mix of balsamic vinegar and lemon juice, for e.g. - use agave nectar, stevia sugar, or regular sugar rather than artificial sweateners - steam or broil/roast veggies  - snack on veggies/fruit/nuts (unsalted, preferably) when possible, rather than processed foods - reduce or eliminate aspartame in diet (it is in diet sodas, chewing gum, etc) Read the labels!  Try to read Dr. Janene Harvey book: "Program for Reversing Diabetes" for other ideas for healthy eating.

## 2020-10-19 ENCOUNTER — Encounter: Payer: Self-pay | Admitting: Internal Medicine

## 2020-11-01 ENCOUNTER — Other Ambulatory Visit: Payer: Self-pay

## 2020-11-01 ENCOUNTER — Encounter: Payer: BC Managed Care – PPO | Attending: Internal Medicine | Admitting: Nutrition

## 2020-11-01 DIAGNOSIS — E1065 Type 1 diabetes mellitus with hyperglycemia: Secondary | ICD-10-CM

## 2020-11-02 ENCOUNTER — Telehealth: Payer: Self-pay | Admitting: Nutrition

## 2020-11-02 NOTE — Progress Notes (Signed)
Patient is here with his mother and was identified by name and DOB.  He reports taking Lantus 40uBID (9AM/9PM) and Humalog 14-15u ac all meals.  He reports that he never missed a dose of insulin and reports taking his Novolog t about 10 minutes before meals. SBGM:  Reports that his blood sugars are "much better since starting Humalog, and log shows better blood sugars starting Saturday: when blood sugars acS was 132, 185 HS,  acB on Sunday was 241, and acL:251, 129 acS and 151HS on Sunday. He then reports that he has gotten a tooth abcess Monday morning and blood sugars "shot back up to mid 200s, with one in the 300s,.  FBS today was 253.  He went to the dentist yesterday, and is starting on antibiotics when they go by the drug store today.   Activity: says is busy around the house all day but planned exercise is Rare. Typcial Day: 7AM: gets up and takes his sister to school and returns home to go back to bed 9-10AM: take Lantus and Novolog.      Bfast: 2 pieces of chees toast, water to drink, with some time 4 ounces of OJ if blood sugar if his readings are below 100 (rare) 1-2PM: Lunch: sandwich, with occasional sweet tea: 16 ounces. (this is done once a week).   5-7PM: Supper: Protein is 5-6 ounces, salad with dressing, and 1 vegetable with 2 pieces of bread.  He denies snacking between meals or after supper.   11-12AM Bed.   Discussed: 1. Importance of taking 2 extra units of Novolog when drinking sweet tea for lunch.  He agreed to do this. 2.  Gave him much praise for good eating, and discussed other options for breakfast and lunch using 2-3 servings of carbs.   3.Mother had diet questions with how many servings of carb he should be eating. 4. Discussed serving sizes of carbs that he likes and reported that he can 3 and if active 4 servings per meal.  Patient reported good understanding of meals 5.  Discussed importance of adjusting Novolog dose for large meals, and active days by 1-2 units, like  Thanksgiving coming up, but warned him that if he eats more, and take more Novolog without exercise, he will gain weight.   He reported good understanding of this topic.

## 2020-11-02 NOTE — Telephone Encounter (Signed)
Blood sugars shown to Dr. Cruzita Lederer, per voice order, patient was called back and told to increase his Humalog dose to 20u ac meals until his blood sugars start coming down.  He can then reduce dose at needed back down to 14-15u.  He reported good understanding of this and had no questions.

## 2020-11-02 NOTE — Patient Instructions (Signed)
Take 2 extra units of Novolog when drinking sweet tea. Decrease Novolog before meals by 2 units when you are going to be active outside, like working in the yard Increase dose of Novolog by 1-2 units for meals containing more than 4 servings of carbs.   Call if questions.

## 2020-11-10 ENCOUNTER — Encounter (HOSPITAL_COMMUNITY): Payer: Self-pay

## 2020-11-10 ENCOUNTER — Emergency Department (HOSPITAL_COMMUNITY)
Admission: EM | Admit: 2020-11-10 | Discharge: 2020-11-10 | Disposition: A | Discharge status: Home / Self Care | Payer: BC Managed Care – PPO | Attending: Emergency Medicine | Admitting: Emergency Medicine

## 2020-11-10 ENCOUNTER — Other Ambulatory Visit: Payer: Self-pay

## 2020-11-10 ENCOUNTER — Emergency Department (HOSPITAL_COMMUNITY): Payer: BC Managed Care – PPO

## 2020-11-10 DIAGNOSIS — I10 Essential (primary) hypertension: Secondary | ICD-10-CM | POA: Insufficient documentation

## 2020-11-10 DIAGNOSIS — Y92410 Unspecified street and highway as the place of occurrence of the external cause: Secondary | ICD-10-CM | POA: Diagnosis not present

## 2020-11-10 DIAGNOSIS — Z7722 Contact with and (suspected) exposure to environmental tobacco smoke (acute) (chronic): Secondary | ICD-10-CM | POA: Diagnosis not present

## 2020-11-10 DIAGNOSIS — S80911A Unspecified superficial injury of right knee, initial encounter: Secondary | ICD-10-CM | POA: Diagnosis not present

## 2020-11-10 DIAGNOSIS — Z7982 Long term (current) use of aspirin: Secondary | ICD-10-CM | POA: Insufficient documentation

## 2020-11-10 DIAGNOSIS — R52 Pain, unspecified: Secondary | ICD-10-CM

## 2020-11-10 DIAGNOSIS — T148XXA Other injury of unspecified body region, initial encounter: Secondary | ICD-10-CM

## 2020-11-10 DIAGNOSIS — Z79899 Other long term (current) drug therapy: Secondary | ICD-10-CM | POA: Insufficient documentation

## 2020-11-10 DIAGNOSIS — E1065 Type 1 diabetes mellitus with hyperglycemia: Secondary | ICD-10-CM | POA: Diagnosis not present

## 2020-11-10 DIAGNOSIS — S80919A Unspecified superficial injury of unspecified knee, initial encounter: Secondary | ICD-10-CM | POA: Diagnosis present

## 2020-11-10 DIAGNOSIS — R739 Hyperglycemia, unspecified: Secondary | ICD-10-CM

## 2020-11-10 DIAGNOSIS — Z794 Long term (current) use of insulin: Secondary | ICD-10-CM | POA: Diagnosis not present

## 2020-11-10 DIAGNOSIS — M25561 Pain in right knee: Secondary | ICD-10-CM

## 2020-11-10 LAB — CBG MONITORING, ED: Glucose-Capillary: 277 mg/dL — ABNORMAL HIGH (ref 70–99)

## 2020-11-10 IMAGING — DX DG ANKLE COMPLETE 3+V*R*
3 series · 3 of 3 positions shown · non-contrast
Comparison: None.

CLINICAL DATA: Pain

EXAM:
RIGHT ANKLE - COMPLETE 3+ VIEW

[ankle ap]
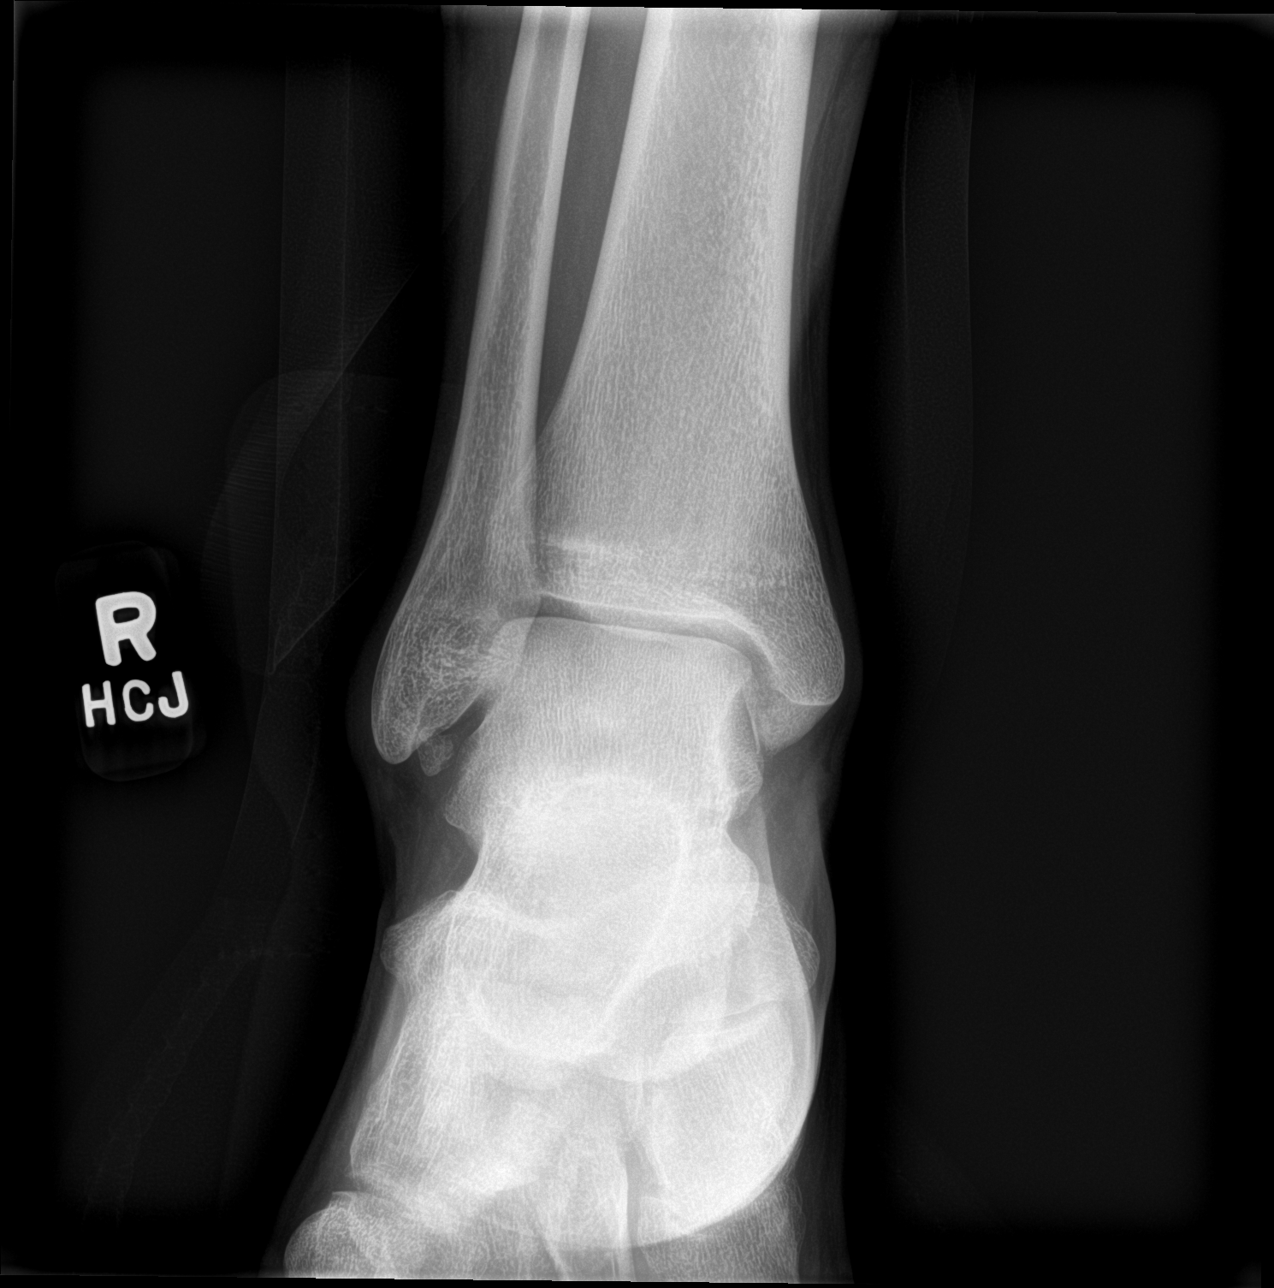

[ankle obl]
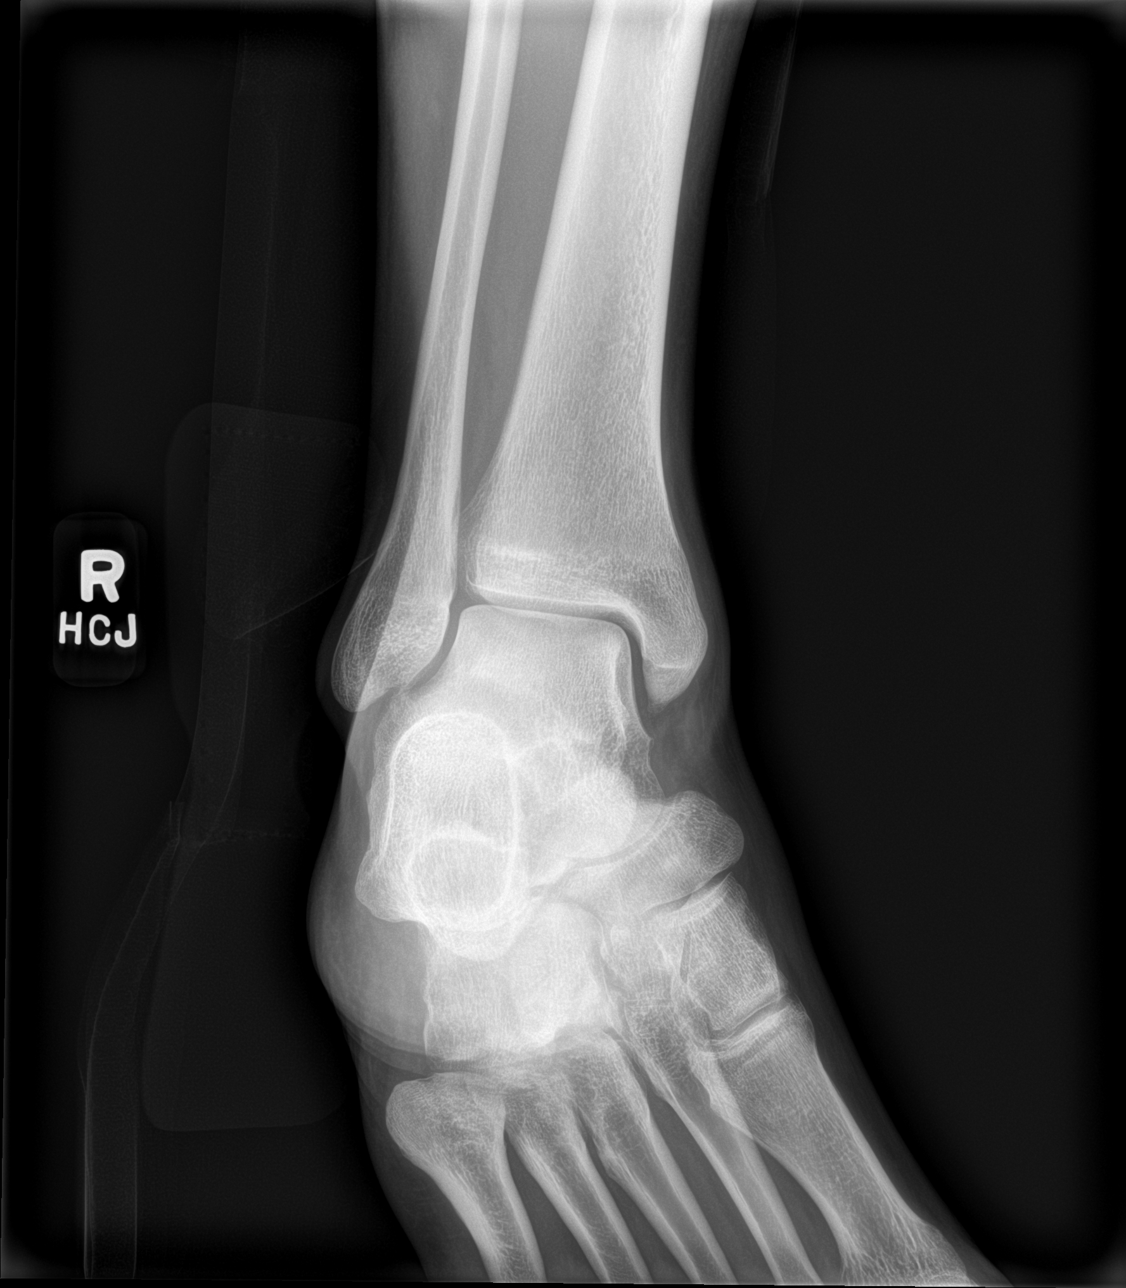

[ankle lat]
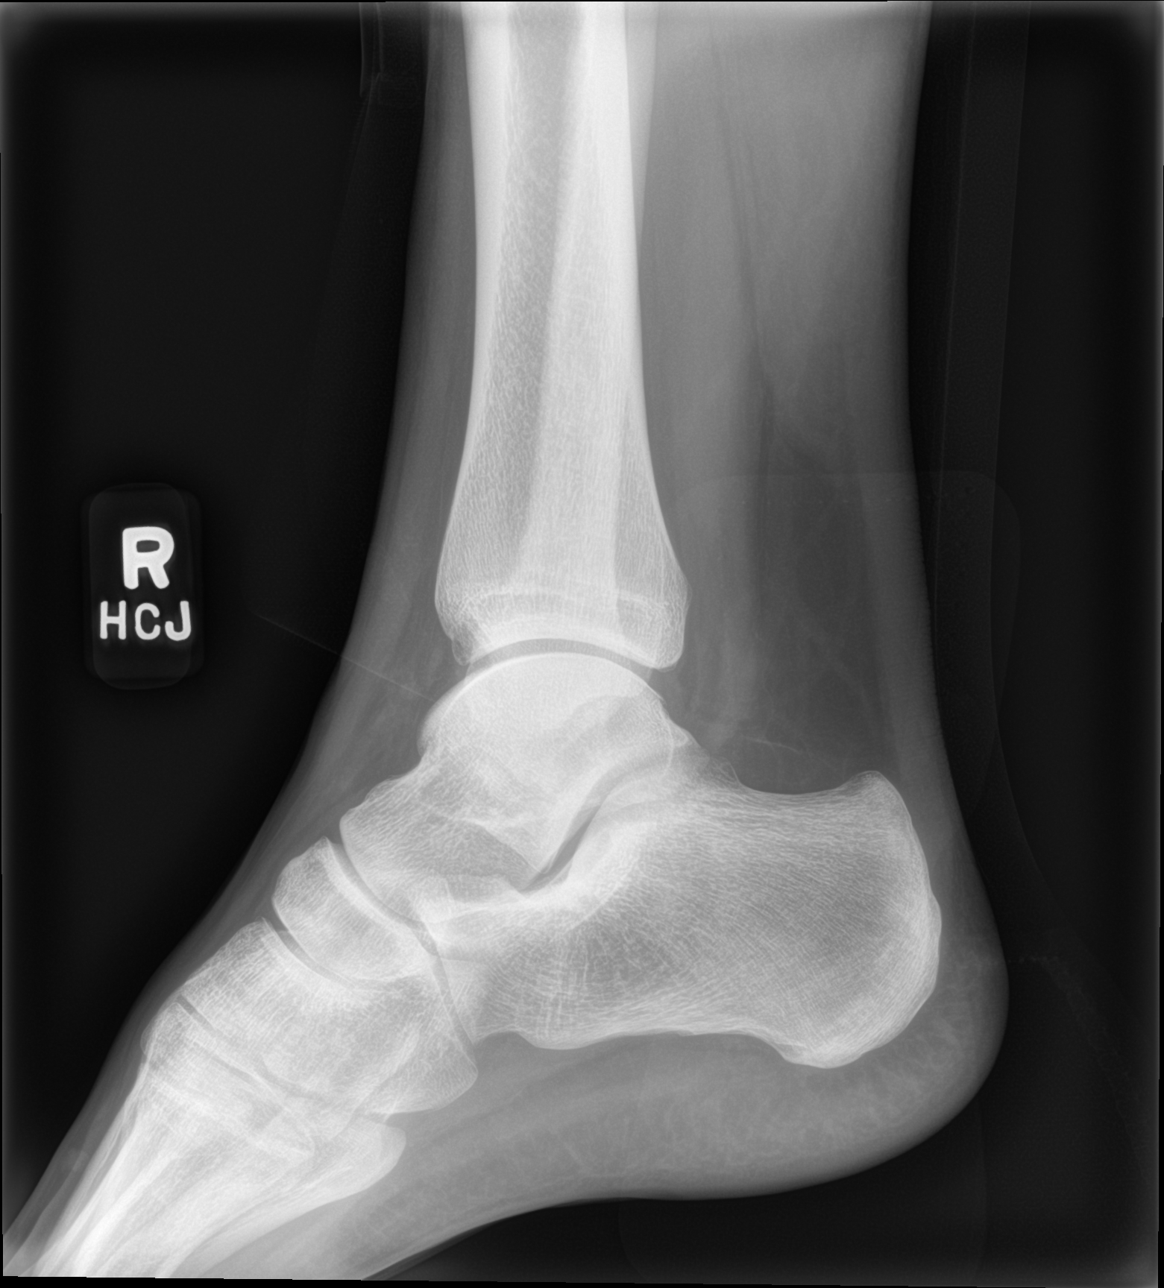

[3 of 3 positions shown; findings below may reference images not displayed]

FINDINGS: There is no evidence of fracture, dislocation, or joint effusion.
There is a well corticated ossicle seen adjacent to the lateral
malleolus. Soft tissues are unremarkable.
IMPRESSION: There is a well corticated ossicle seen adjacent to the lateral
malleolus which could represent a prior injury. Please correlate
with the patient's site ofpain

## 2020-11-10 IMAGING — DX DG KNEE 1-2V PORT*R*
4 series · 4 of 4 positions shown · non-contrast
Comparison: None.

CLINICAL DATA: Knee pain

EXAM:
PORTABLE RIGHT KNEE - 1-2 VIEW

[knee lat]
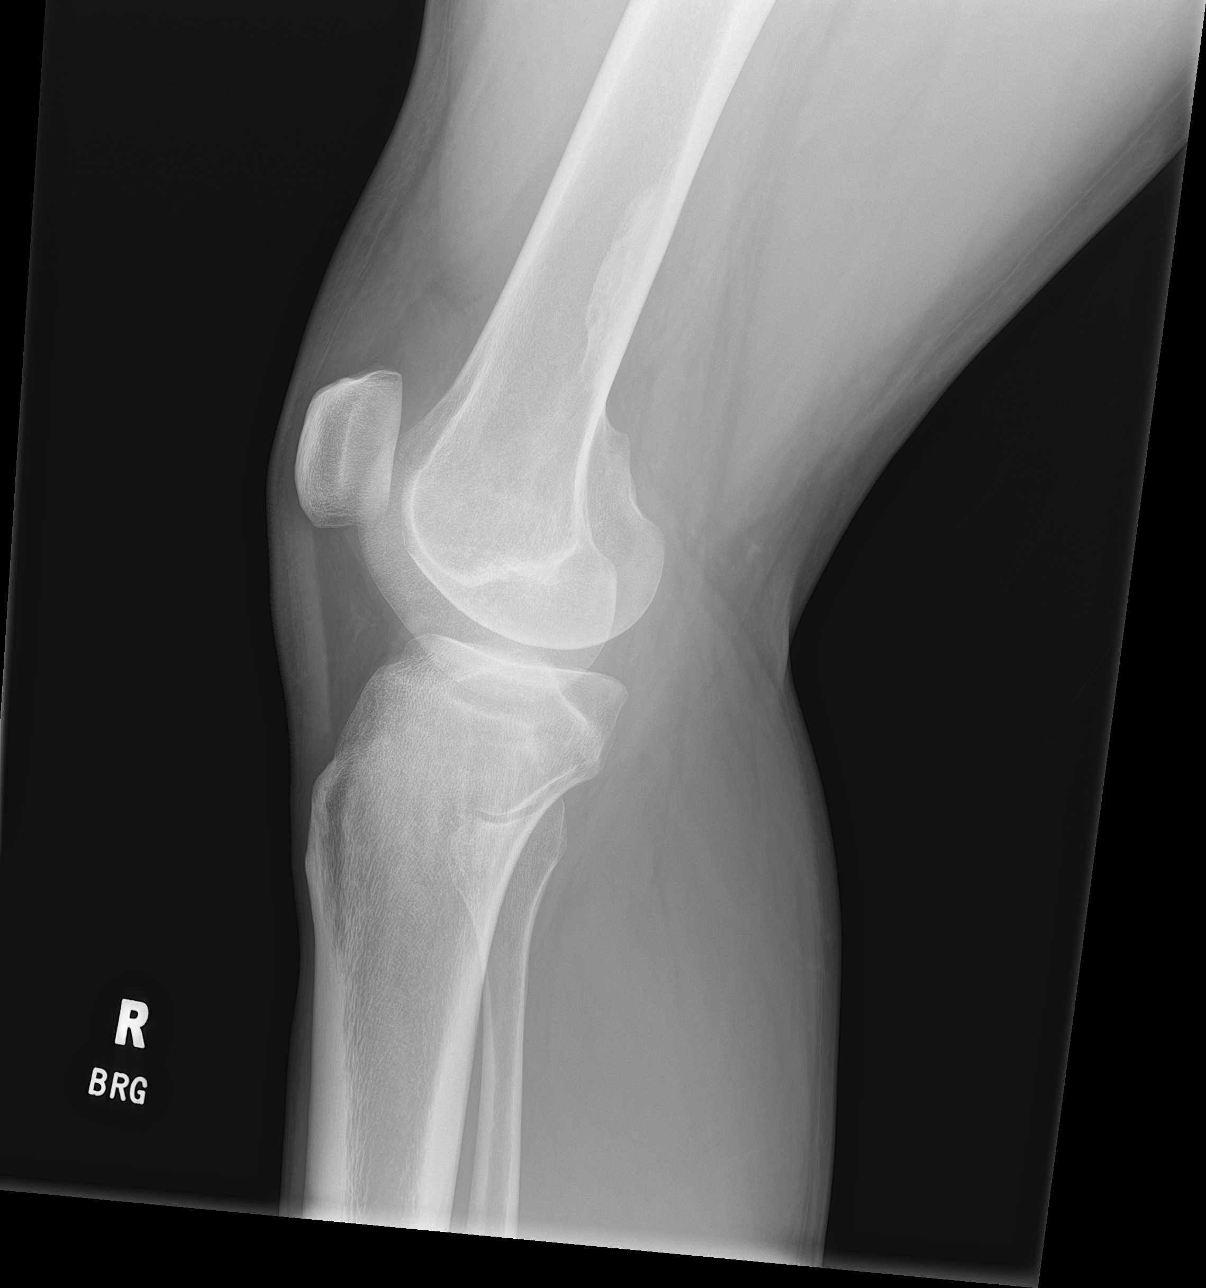

[knee obl (1 of 2)]
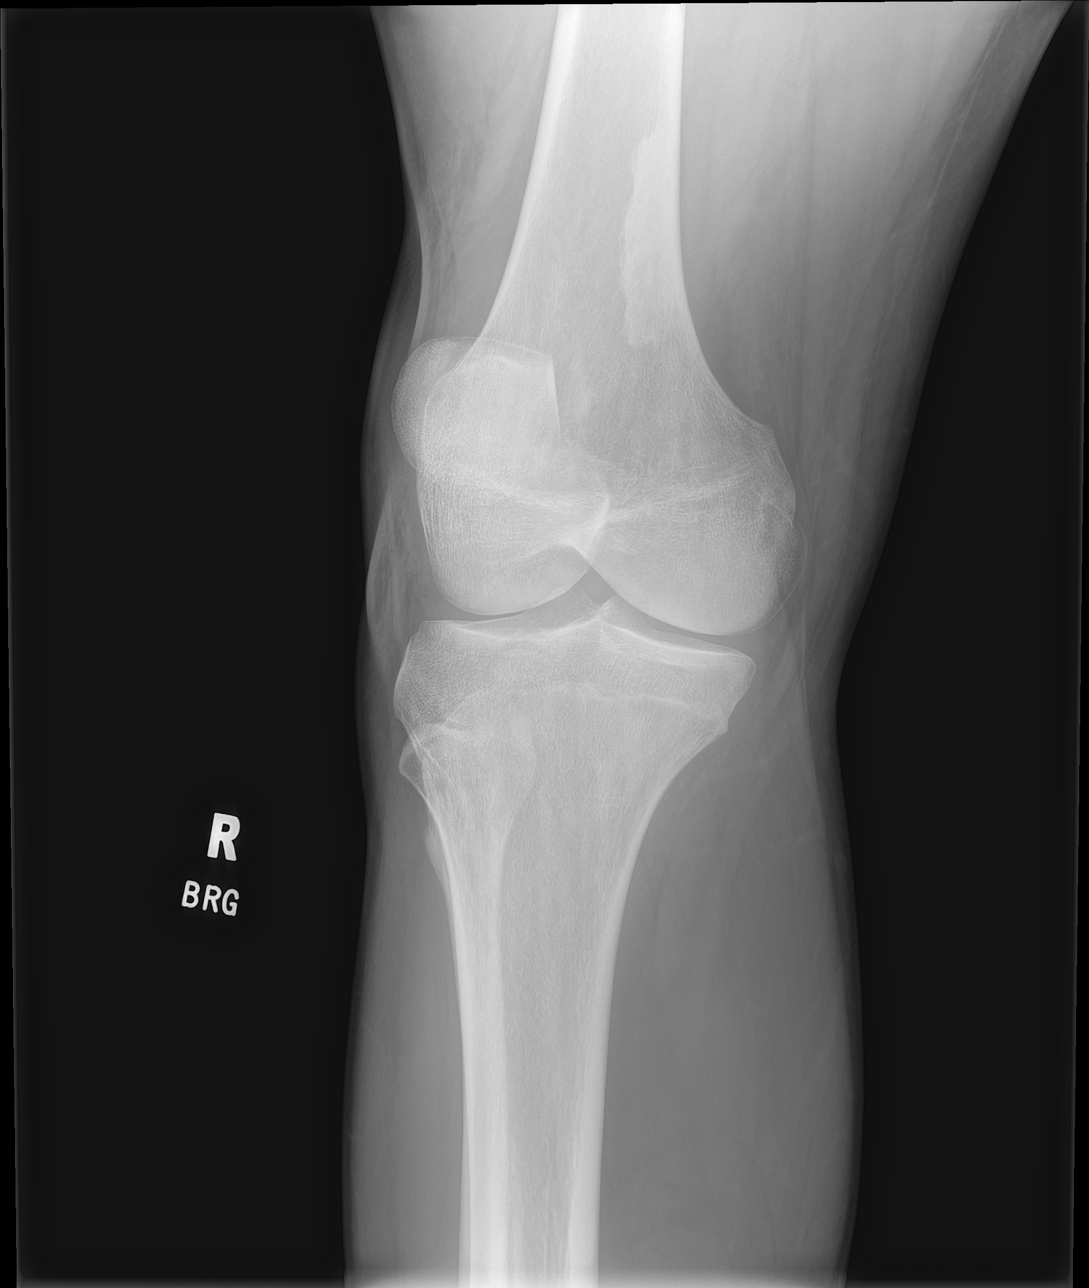

[knee ap]
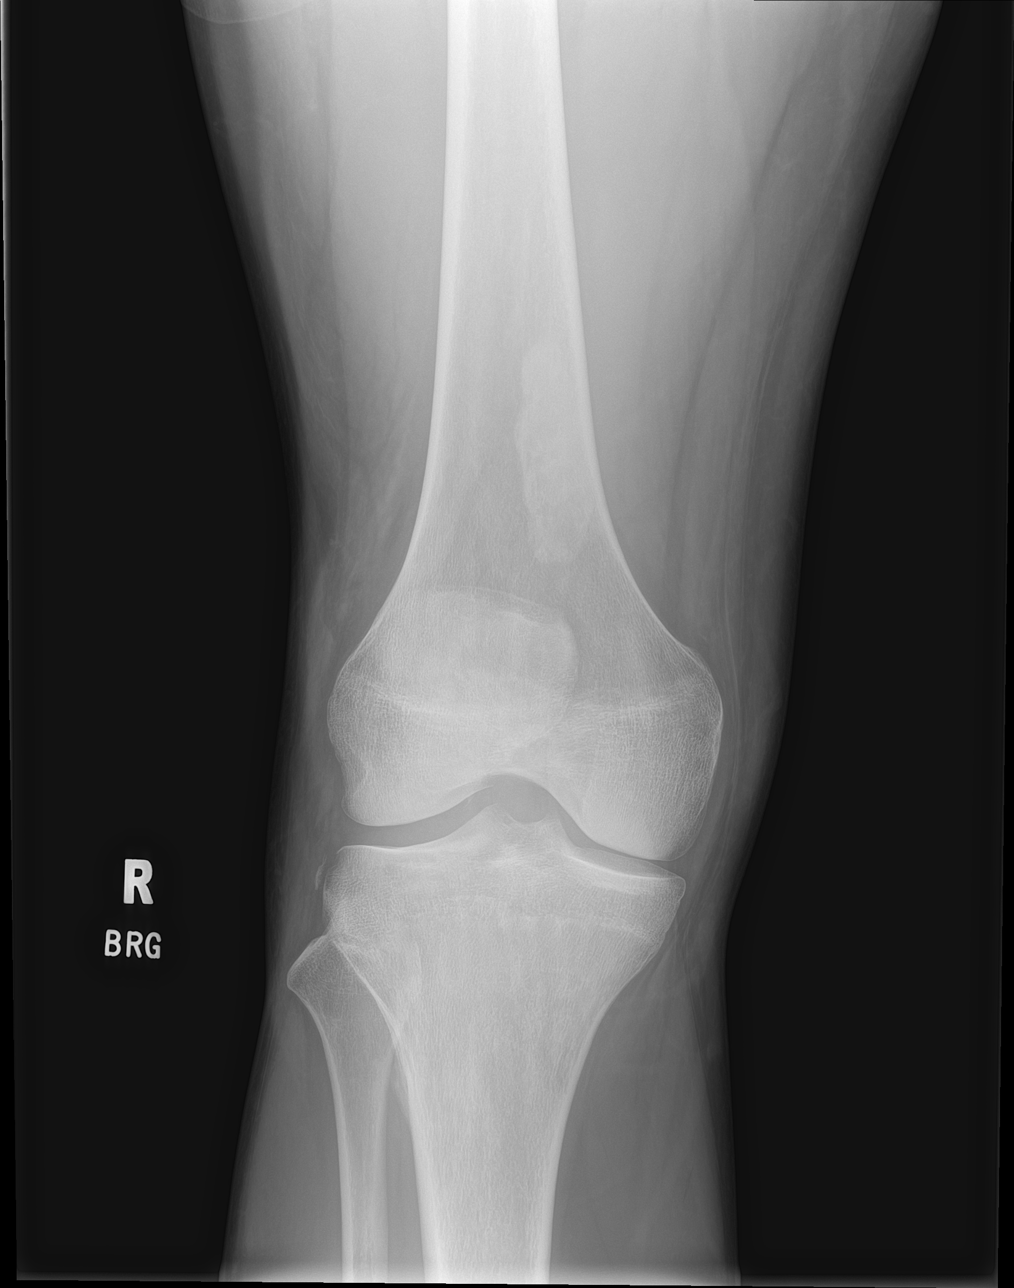

[knee obl (2 of 2)]
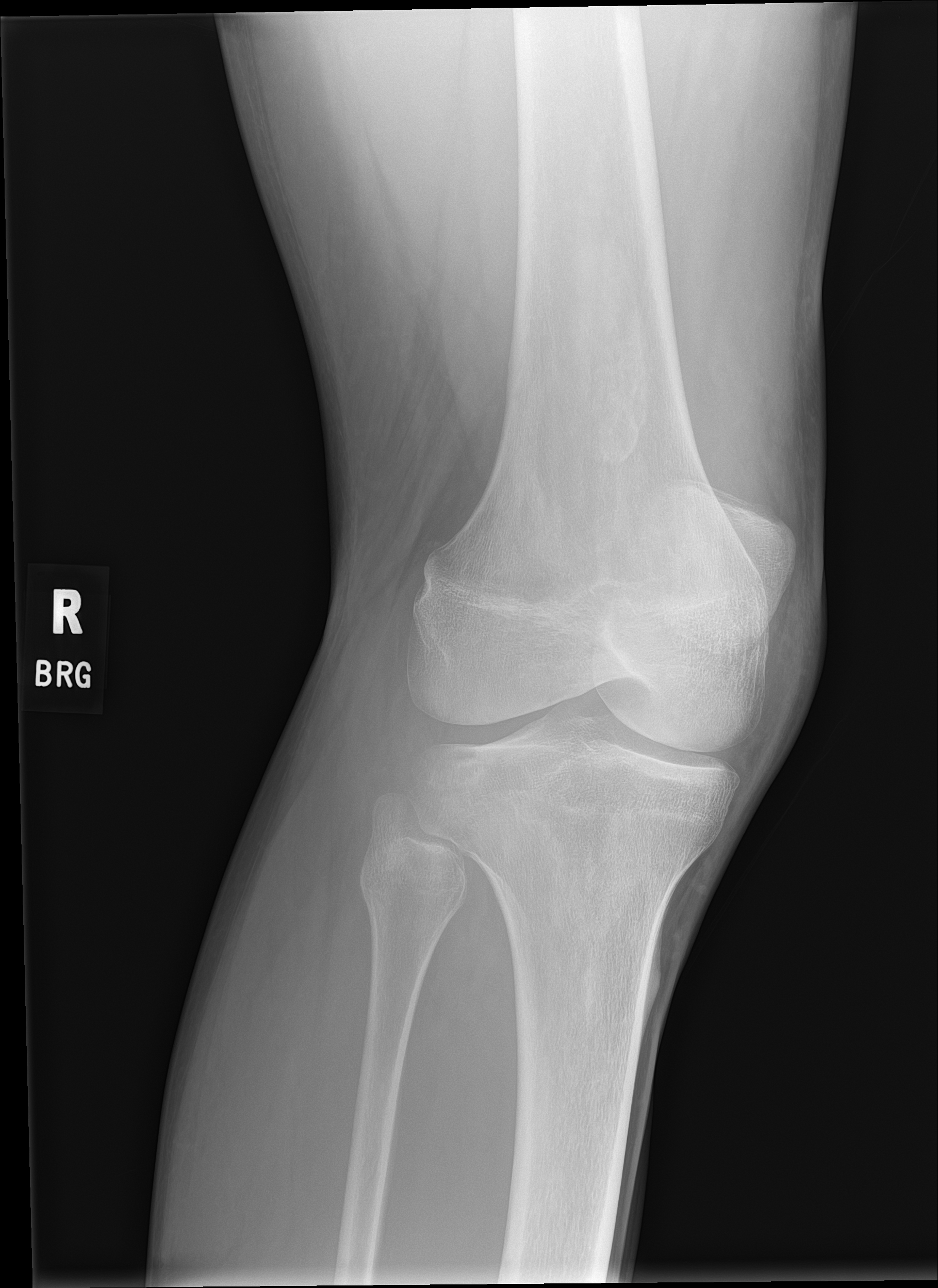

[4 of 4 positions shown; findings below may reference images not displayed]

FINDINGS: Tiny ossific fragment seen adjacent to the lateral tibial plateau,
likely from an avulsion injury. A small knee joint effusion is
present. A ossifying fibroma seen within the medial femoral
metadiaphysis.
IMPRESSION: Tiny avulsion injury off the lateral proximal tibia.

## 2020-11-10 MED ORDER — HYDROCODONE-ACETAMINOPHEN 5-325 MG PO TABS
ORAL_TABLET | ORAL | 0 refills | Status: DC
Start: 2020-11-10 — End: 2020-11-21

## 2020-11-10 MED ORDER — MORPHINE SULFATE (PF) 4 MG/ML IV SOLN
4.0000 mg | Freq: Once | INTRAVENOUS | Status: AC
  Administered 2020-11-10: 4 mg via INTRAVENOUS
  Filled 2020-11-10: qty 1

## 2020-11-10 MED ORDER — ONDANSETRON HCL 4 MG/2ML IJ SOLN
4.0000 mg | Freq: Once | INTRAMUSCULAR | Status: AC
  Administered 2020-11-10: 4 mg via INTRAVENOUS
  Filled 2020-11-10: qty 2

## 2020-11-10 NOTE — Discharge Instructions (Addendum)
Your x-ray this evening shows that you have a small chip fracture of your right knee.  Your exam is also concerning that you may have a ligament injury of your knee.  You will need to follow-up with orthopedics.  Wear the knee brace as directed.  Minimal to no weightbearing of your right leg.  Elevate and apply ice when possible.  Call Dr. Ruthe Mannan office tomorrow and arrange a follow-up appointment.  Be sure to take your insulin when you return home.
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
## 2020-11-21 ENCOUNTER — Ambulatory Visit (INDEPENDENT_AMBULATORY_CARE_PROVIDER_SITE_OTHER): Payer: BC Managed Care – PPO | Admitting: Orthopedic Surgery

## 2020-11-21 ENCOUNTER — Encounter: Payer: Self-pay | Admitting: Orthopedic Surgery

## 2020-11-21 ENCOUNTER — Other Ambulatory Visit: Payer: Self-pay

## 2020-11-21 VITALS — BP 129/86 | HR 108 | Ht 75.0 in

## 2020-11-21 DIAGNOSIS — M25361 Other instability, right knee: Secondary | ICD-10-CM | POA: Diagnosis not present

## 2020-11-21 DIAGNOSIS — M25561 Pain in right knee: Secondary | ICD-10-CM | POA: Diagnosis not present

## 2020-11-21 MED ORDER — IBUPROFEN 800 MG PO TABS: 800.0000 mg | ORAL_TABLET | Freq: Three times a day (TID) | ORAL | 1 refills | Status: DC | PRN

## 2020-11-21 MED ORDER — HYDROCODONE-ACETAMINOPHEN 5-325 MG PO TABS: 1.0000 | ORAL_TABLET | Freq: Four times a day (QID) | ORAL | 0 refills | Status: DC | PRN

## 2020-11-21 NOTE — Patient Instructions (Signed)
Ice the knee 3 times a day for 30 minutes make sure you have a towel under the eyes to prevent skin burn  Take hydrocodone 5 mg every 6 hours as needed and ibuprofen 800 mg every 8 hours around-the-clock  Speak to primary care doctor about nerve pain medication  We will order MRI of your knee to determine if you have a ligament injury that would require surgery and remember if it is because of your heart condition we would refer you to a center that can handle that  Try to weight-bear with the brace and the crutches
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
## 2020-11-23 NOTE — Addendum Note (Signed)
Addended by: Elizabeth Sauer on: 11/23/2020 09:24 AM   Modules accepted: Orders

## 2020-11-28 ENCOUNTER — Telehealth: Payer: Self-pay | Admitting: *Deleted

## 2020-11-28 NOTE — Telephone Encounter (Signed)
-----   Message from Massie Maroon, [place] sent at 11/28/2020 11:05 AM EST -----  ----- Message ----- From: Verta Ellen., NP Sent: 11/23/2020   6:50 PM EST To: Laurine Blazer, LPN  Please call the patient and let him know the cardiac monitor showed no abnormal rhythms. Thank You

## 2020-11-28 NOTE — Telephone Encounter (Signed)
Pt voiced understanding

## 2020-11-29 NOTE — Telephone Encounter (Signed)
Per MyChart message - Patient called to schedule appointment for this week or as soon as he can be seen by Dr Aline Brochure. Forwarding message to Abigail Butts to review and advise due to no open slots on current schedule.

## 2020-12-06 ENCOUNTER — Ambulatory Visit: Payer: BC Managed Care – PPO | Admitting: Internal Medicine

## 2020-12-13 ENCOUNTER — Ambulatory Visit
Admission: RE | Admit: 2020-12-13 | Discharge: 2020-12-13 | Disposition: A | Discharge status: Home / Self Care | Payer: BC Managed Care – PPO | Source: Ambulatory Visit | Attending: Orthopedic Surgery | Admitting: Orthopedic Surgery

## 2020-12-13 ENCOUNTER — Other Ambulatory Visit: Payer: Self-pay

## 2020-12-13 DIAGNOSIS — M25361 Other instability, right knee: Secondary | ICD-10-CM

## 2020-12-13 DIAGNOSIS — M25561 Pain in right knee: Secondary | ICD-10-CM
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
## 2020-12-21 ENCOUNTER — Ambulatory Visit (INDEPENDENT_AMBULATORY_CARE_PROVIDER_SITE_OTHER): Payer: BC Managed Care – PPO | Admitting: Orthopedic Surgery

## 2020-12-21 ENCOUNTER — Other Ambulatory Visit: Payer: Self-pay

## 2020-12-21 ENCOUNTER — Ambulatory Visit: Payer: BC Managed Care – PPO

## 2020-12-21 ENCOUNTER — Encounter: Payer: Self-pay | Admitting: Orthopedic Surgery

## 2020-12-21 VITALS — BP 146/99 | HR 92 | Ht 75.0 in | Wt 235.0 lb

## 2020-12-21 DIAGNOSIS — S83511D Sprain of anterior cruciate ligament of right knee, subsequent encounter: Secondary | ICD-10-CM

## 2020-12-21 DIAGNOSIS — F419 Anxiety disorder, unspecified: Secondary | ICD-10-CM | POA: Insufficient documentation

## 2020-12-21 DIAGNOSIS — G478 Other sleep disorders: Secondary | ICD-10-CM | POA: Insufficient documentation

## 2020-12-21 DIAGNOSIS — D72829 Elevated white blood cell count, unspecified: Secondary | ICD-10-CM | POA: Insufficient documentation

## 2020-12-21 DIAGNOSIS — R011 Cardiac murmur, unspecified: Secondary | ICD-10-CM | POA: Insufficient documentation

## 2020-12-21 DIAGNOSIS — R519 Headache, unspecified: Secondary | ICD-10-CM | POA: Insufficient documentation

## 2020-12-21 DIAGNOSIS — R0789 Other chest pain: Secondary | ICD-10-CM | POA: Insufficient documentation

## 2020-12-21 DIAGNOSIS — M79671 Pain in right foot: Secondary | ICD-10-CM | POA: Diagnosis not present

## 2020-12-21 DIAGNOSIS — Z6830 Body mass index (BMI) 30.0-30.9, adult: Secondary | ICD-10-CM | POA: Insufficient documentation

## 2020-12-21 DIAGNOSIS — M25561 Pain in right knee: Secondary | ICD-10-CM

## 2020-12-21 DIAGNOSIS — F410 Panic disorder [episodic paroxysmal anxiety] without agoraphobia: Secondary | ICD-10-CM | POA: Insufficient documentation

## 2020-12-21 DIAGNOSIS — G8929 Other chronic pain: Secondary | ICD-10-CM | POA: Insufficient documentation

## 2020-12-21 DIAGNOSIS — S83241A Other tear of medial meniscus, current injury, right knee, initial encounter: Secondary | ICD-10-CM | POA: Diagnosis not present

## 2020-12-21 DIAGNOSIS — K089 Disorder of teeth and supporting structures, unspecified: Secondary | ICD-10-CM | POA: Insufficient documentation

## 2020-12-21 DIAGNOSIS — R1013 Epigastric pain: Secondary | ICD-10-CM | POA: Insufficient documentation

## 2020-12-21 DIAGNOSIS — R000 Tachycardia, unspecified: Secondary | ICD-10-CM | POA: Insufficient documentation

## 2020-12-21 DIAGNOSIS — M25361 Other instability, right knee: Secondary | ICD-10-CM | POA: Diagnosis not present

## 2020-12-21 DIAGNOSIS — K1230 Oral mucositis (ulcerative), unspecified: Secondary | ICD-10-CM | POA: Insufficient documentation

## 2020-12-21 DIAGNOSIS — G4700 Insomnia, unspecified: Secondary | ICD-10-CM | POA: Insufficient documentation

## 2020-12-21 DIAGNOSIS — K219 Gastro-esophageal reflux disease without esophagitis: Secondary | ICD-10-CM | POA: Insufficient documentation

## 2020-12-21 MED ORDER — HYDROCODONE-ACETAMINOPHEN 5-325 MG PO TABS: 1.0000 | ORAL_TABLET | Freq: Four times a day (QID) | ORAL | 0 refills | Status: AC | PRN
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
## 2020-12-21 NOTE — Patient Instructions (Addendum)
Continue brace and crutches  Continue icing the knee to control swelling  Continue bending the knee is much as possible  Your hemoglobin A1c was elevated please work on controlling your glucose  See the cardiologist once he has  cleared him for surgery please give [place] a call   Anterior Cruciate Ligament Reconstruction A ligament is a strong, cord-like band of tissue that connects one bone to another bone. The anterior cruciate ligament (ACL) connects one of your lower leg bones (tibia) to your upper leg bone (femur). ACL reconstruction is a surgery to replace a torn ACL. During ACL reconstruction, your torn ACL is replaced with tissue from a tendon in another part of your body or from a donor. This replacement tissue is called a graft. Tell a health care provider about:  Any allergies you have.  All medicines you are taking, including vitamins, herbs, eye drops, creams, and over-the-counter medicines.  Any problems you or family members have had with anesthetic medicines.  Any blood disorders you have.  Any surgeries you have had.  Any medical conditions you have.  Any history of tobacco use. What are the risks? Generally, this is a safe procedure. However, problems may occur, including:  Infection.  Bleeding.  Allergic reactions to medicines.  Damage to other structures or organs.  Failure of the procedure.  Knee stiffness. What happens before the procedure? Medicines Ask your health care provider about:  Changing or stopping your regular medicines. This is especially important if you are taking diabetes medicines or blood thinners.  Taking medicines such as aspirin and ibuprofen. These medicines can thin your blood. Do not take these medicines unless your health care provider tells you to take them.  Taking over-the-counter medicines, vitamins, herbs, and supplements. Eating and drinking Follow instructions from your health care provider about eating and drinking,  which may include:  8 hours before the procedure - stop eating heavy meals or foods, such as meat, fried foods, or fatty foods.  6 hours before the procedure - stop eating light meals or foods, such as toast or cereal.  6 hours before the procedure - stop drinking milk or drinks that contain milk.  2 hours before the procedure - stop drinking clear liquids. Staying hydrated Follow instructions from your health care provider about hydration, which may include:  Up to 2 hours before the procedure - you may continue to drink clear liquids, such as water, clear fruit juice, black coffee, and plain tea. General instructions  Do not use any products that contain nicotine or tobacco for at least 4 weeks before the procedure. These products include cigarettes, e-cigarettes, and chewing tobacco. If you need help quitting, ask your health care provider.  Ask your health care provider: ? How your surgery site will be marked or identified. ? What steps will be taken to help prevent infection. These may include:  Removing hair at the surgery site.  Washing skin with a germ-killing soap.  Taking antibiotic medicine.  Plan to have someone take you home from the hospital or clinic. What happens during the procedure?   An IV may be inserted into one of your veins.  You will be given one or more of the following: ? A medicine to help you relax (sedative). ? A medicine to numb the area (local anesthetic). ? A medicine to make you fall asleep (general anesthetic). ? A medicine that is injected into your spine to numb the area below and slightly above the injection site (spinal  anesthetic). ? A medicine that is injected into an area of your body to numb everything below and slightly above the injection site (regional anesthetic).  A cuff may be placed around your upper leg to slow blood flow to your lower leg during the procedure.  Your surgeon will make small incisions around your knee.  A  clear, germ-free fluid will be injected into your knee through one incision to help your surgeon see inside the joint.  A thin, flexible, tube-like instrument with a camera on the end (arthroscope) will be passed through one of your incisions, into your knee joint.  Other surgical instruments will be passed through the other incisions.  Your torn ACL will be removed.  Your surgeon will create tunnels in your upper tibia and femur using large drills over guide wires or guide pins.  Your surgeon will put in the graft by: ? Pulling the graft into the tunnels. ? Inserting the graft into your knee joint. ? Securing the graft in place in the tunnels with screws.  Your knee joint will be flushed out with fluid.  The incision will be closed with stitches (sutures), skin glue, or adhesive strips.  The incision may be covered with a bandage (dressing). The procedure may vary among health care providers and hospitals. What happens after the procedure?  Your blood pressure, heart rate, breathing rate, and blood oxygen level may be monitored until you leave the hospital or clinic.  A splint will be placed on the injured knee. You will be taught how to use and care for the splint.  Your health care provider will give you instructions about how much body weight you can safely support on your leg (weight-bearing restrictions). You may be given crutches or other devices to help you move around (assistive devices).  You may be shown how to do physical therapy exercises to help you recover.  Do not drive for 24 hours if you were given a sedative during your procedure. Summary  ACL reconstruction is a surgery to replace a torn ACL. Your torn ACL is replaced with a graft. The graft is tissue from a tendon in another part of your body or from a donor.  Follow instructions from your health care provider about eating and drinking before the procedure.  Do not use any products that contain nicotine or  tobacco for at least 4 weeks before the procedure. These products include cigarettes, e-cigarettes, and chewing tobacco.  During the procedure, your health care provider will take measures to reduce your risk of infection.  After the procedure, the incision may be covered with a bandage (dressing). This information is not intended to replace advice given to you by your health care provider. Make sure you discuss any questions you have with your health care provider. Document Revised: 09/04/2018 Document Reviewed: 09/04/2018 Elsevier Patient Education  2020 [employer].
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
## 2020-12-27 ENCOUNTER — Encounter: Payer: Self-pay | Admitting: Family Medicine

## 2020-12-27 ENCOUNTER — Other Ambulatory Visit (INDEPENDENT_AMBULATORY_CARE_PROVIDER_SITE_OTHER): Payer: BC Managed Care – PPO

## 2020-12-27 ENCOUNTER — Ambulatory Visit (INDEPENDENT_AMBULATORY_CARE_PROVIDER_SITE_OTHER): Payer: BC Managed Care – PPO | Admitting: Family Medicine

## 2020-12-27 VITALS — BP 116/80 | HR 109 | Resp 16 | Ht 75.0 in | Wt 226.4 lb

## 2020-12-27 DIAGNOSIS — R0609 Other forms of dyspnea: Secondary | ICD-10-CM

## 2020-12-27 DIAGNOSIS — R0600 Dyspnea, unspecified: Secondary | ICD-10-CM | POA: Diagnosis not present

## 2020-12-27 DIAGNOSIS — R000 Tachycardia, unspecified: Secondary | ICD-10-CM | POA: Diagnosis not present

## 2020-12-27 DIAGNOSIS — Z794 Long term (current) use of insulin: Secondary | ICD-10-CM

## 2020-12-27 DIAGNOSIS — R0789 Other chest pain: Secondary | ICD-10-CM | POA: Diagnosis not present

## 2020-12-27 DIAGNOSIS — Z01818 Encounter for other preprocedural examination: Secondary | ICD-10-CM

## 2020-12-27 DIAGNOSIS — I10 Essential (primary) hypertension: Secondary | ICD-10-CM

## 2020-12-27 DIAGNOSIS — E119 Type 2 diabetes mellitus without complications: Secondary | ICD-10-CM

## 2020-12-27 DIAGNOSIS — R002 Palpitations: Secondary | ICD-10-CM

## 2020-12-27 NOTE — Patient Instructions (Signed)
Medication Instructions:  Your physician recommends that you continue on your current medications as directed. Please refer to the Current Medication list given to you today.  *If you need a refill on your cardiac medications before your next appointment, please call your pharmacy*   Lab Work: None today If you have labs (blood work) drawn today and your tests are completely normal, you will receive your results only by: . MyChart Message (if you have MyChart) OR . A paper copy in the mail If you have any lab test that is abnormal or we need to change your treatment, we will call you to review the results.   Testing/Procedures: None today   Follow-Up: At [facility], you and your health needs are our priority.  As part of our continuing mission to provide you with exceptional heart care, we have created designated Provider Care Teams.  These Care Teams include your primary Cardiologist (physician) and Advanced Practice Providers (APPs -  Physician Assistants and Nurse Practitioners) who all work together to provide you with the care you need, when you need it.  We recommend signing up for the patient portal called "MyChart".  Sign up information is provided on this After Visit Summary.  MyChart is used to connect with patients for Virtual Visits (Telemedicine).  Patients are able to view lab/test results, encounter notes, upcoming appointments, etc.  Non-urgent messages can be sent to your provider as well.   To learn more about what you can do with MyChart, go to [email or web address].    Your next appointment:   3 month(s)  The format for your next appointment:   In Person  Provider:   Andy Quinn, NP   Other Instructions None      Thank you for choosing [facility] !         

## 2021-01-04 ENCOUNTER — Other Ambulatory Visit (HOSPITAL_COMMUNITY): Payer: Self-pay | Admitting: Family Medicine

## 2021-01-04 ENCOUNTER — Ambulatory Visit: Payer: BC Managed Care – PPO | Admitting: Internal Medicine

## 2021-01-04 ENCOUNTER — Other Ambulatory Visit: Payer: Self-pay | Admitting: Family Medicine

## 2021-01-04 DIAGNOSIS — I739 Peripheral vascular disease, unspecified: Secondary | ICD-10-CM

## 2021-01-04 NOTE — Progress Notes (Deleted)
Patient ID: [name], male   DOB: [birth year], 25 y.o.   MRN: [id number]   This visit occurred during the SARS-CoV-2 public health emergency.  Safety protocols were in place, including screening questions prior to the visit, additional usage of staff PPE, and extensive cleaning of exam room while observing appropriate contact time as indicated for disinfecting solutions.   HPI: [name] is a 25 y.o.-year-old male, initially referred by his PCP, Dr. Angelia Mould, returning for follow-up for DM, dx at 25 years old, insulin-dependent since 25 y/o, uncontrolled, with complications (autonomic neuropathy, PN, microalbuminuria, DR).  She saw Dr. Dorris Fetch in the past.  Last visit with me 2.5 months ago.  Reviewed HbA1c levels: Lab Results  Component Value Date   HGBA1C 11.4 (A) 10/18/2020   HGBA1C 9.7 (H) 03/17/2019   HGBA1C 8.6 (H) 11/18/2018   HGBA1C 12.5 08/14/2018   HGBA1C 13 02/11/2017  08/2020: HbA1c 12.1% 04/2020: HbA1c 11.5%  Pt is on a regimen of: - Metformin ER 500 >> 1000 mg 2x a day, with meals - Victoza 1.2 mg daily in am - started by PCP on 09/28/2020 - no nausea >> Ozempic 0.5 mg weekly - Lantus 77 units at bedtime >> split 35 units 2x a day on 09/28/2020 - Humalog 10-14 units before each meal He was on Humalog 7-7-9 units before meals  - stopped when started Victoza Metformin IR >> nausea.  Pt checks his sugars 2 to 3 times a day: - am: 300-317 - 2h after b'fast: n/c - before lunch: 250 - 2h after lunch: n/c - before dinner: 250 - 2h after dinner: n/c - bedtime: 200-310 - nighttime: n/c Lowest sugar was 200; it is unclear at which level he has hypoglycemia awareness. Highest sugar was 350.  Glucometer: Accu-Chek >> ReliOn   Pt's meals are: - Breakfast: if eats b'fast: cornflakes + milk - Lunch: half a sandwich - Dinner: meat + veggies + starch - home cooked - Snacks: not usually He continues to drink sweet tea.  -No CKD but he has a history of  microalbuminuria, last BUN/creatinine:  05/19/2020: 13/0.78, GFR 174, glucose 419 Lab Results  Component Value Date   BUN 12 03/17/2019   BUN 12 11/19/2018   CREATININE 0.84 03/17/2019   CREATININE 0.70 11/19/2018   + History of elevated ACR: 05/19/2020: ACR 15 10/19/2015: ACR 78.8 07/28/2012: ACR 41.8 Not on ACE inhibitor/ARB.  -+ HL; last set of lipids: 05/19/2020: 156/102/27/86 10/19/2015: 202/110/63/117 No results found for: CHOL, HDL, LDLCALC, LDLDIRECT, TRIG, CHOLHDL On Lipitor 10.  - last eye exam was in spring 2021: + DR reportedly  - + numbness and tingling in his feet.  Prev. On Reglan. On ASA 81.  Pt has FH of DM in MGM, MGGM, PGF - prediabetes.  She also has a history of HTN, cardiac murmur.  He had tachycardia and palpitations due to autoimmune.  He was started on carvedilol after an ED visit in 2019.  He continues to have shortness of breath and exertional fatigue. Stress test, 2D Echo and heart monitor >> recently normal.  Latest TSH was normal: Lab Results  Component Value Date   TSH 0.62 03/17/2019   He is unemployed.  He walks for exercise but this is limited due to his shortness of breath.  ROS: Constitutional: no weight gain/+ weight loss, + fatigue, no subjective hyperthermia, no subjective hypothermia, + poor sleep Eyes: no blurry vision, no xerophthalmia ENT: no sore throat, no nodules palpated in neck, no dysphagia,  no odynophagia, no hoarseness Cardiovascular: no CP/+ SOB/no palpitations/no leg swelling Respiratory: no cough/+ SOB/no wheezing Gastrointestinal: no N/no V/no D/no C/+ acid reflux Musculoskeletal: + muscle aches/+ joint aches Skin: no rashes, no hair loss Neurological: no tremors/+ numbness/n+tingling/no dizziness, + HAs  I reviewed pt's medications, allergies, PMH, social hx, family hx, and changes were documented in the history of present illness. Otherwise, unchanged from my initial visit note.  Past Medical History:  Diagnosis  Date  . Diabetes mellitus, type II ([place])   . Heart murmur   . Hypertension    Past Surgical History:  Procedure Laterality Date  . PALATE / UVULA BIOPSY / EXCISION     "growth removed"  . UPPER GASTROINTESTINAL ENDOSCOPY     Done in [place] - 2013 ish   Social History   Socioeconomic History  . Marital status: Single    Spouse name: Not on file  . Number of children: Not on file  . Years of education: Not on file  . Highest education level: Not on file  Occupational History  . Not on file  Tobacco Use  . Smoking status: Passive Smoke Exposure - Never Smoker  . Smokeless tobacco: Never Used  Vaping Use  . Vaping Use: Never used  Substance and Sexual Activity  . Alcohol use: No  . Drug use: No  . Sexual activity: Not on file  Other Topics Concern  . Not on file  Social History Narrative  . Not on file   Social Determinants of Health   Financial Resource Strain: Not on file  Food Insecurity: Not on file  Transportation Needs: Not on file  Physical Activity: Not on file  Stress: Not on file  Social Connections: Not on file  Intimate Partner Violence: Not on file   Current Outpatient Medications on File Prior to Visit  Medication Sig Dispense Refill  . acetaminophen (TYLENOL) 500 MG tablet Take 500 mg by mouth every 6 (six) hours as needed.    Marland Kitchen aspirin EC 81 MG tablet Take 1 tablet (81 mg total) by mouth daily.    Marland Kitchen atorvastatin (LIPITOR) 10 MG tablet Take 1 tablet (10 mg total) by mouth daily. 30 tablet 6  . blood glucose meter kit and supplies KIT Dispense based on patient and insurance preference. Use up to four times daily as directed. (FOR ICD-10 E11.65). 1 each 5  . carvedilol (COREG) 25 MG tablet Take 1 tablet (25 mg total) by mouth 2 (two) times daily. 60 tablet 6  . hydrOXYzine (ATARAX/VISTARIL) 50 MG tablet Take 50 mg by mouth 3 (three) times daily as needed.    Marland Kitchen ibuprofen (ADVIL) 800 MG tablet Take 1 tablet (800 mg total) by mouth every 8 (eight)  hours as needed. 90 tablet 1  . insulin glargine (LANTUS SOLOSTAR) 100 UNIT/ML Solostar Pen Inject 35 Units into the skin 2 (two) times daily. 45 mL 3  . insulin lispro (HUMALOG KWIKPEN) 200 UNIT/ML KwikPen Inject under skin 10-14 units 3x a day before meals (Patient taking differently: Inject 15 Units into the skin 3 (three) times daily before meals.) 45 mL 3  . Insulin Pen Needle (CARETOUCH PEN NEEDLES) 31G X 6 MM MISC Use 6x a day 400 each 3  . isosorbide mononitrate (IMDUR) 30 MG 24 hr tablet Take 1 tablet (30 mg total) by mouth daily. 30 tablet 6  . Lancet Devices (ACCU-CHEK SOFTCLIX) lancets Use as instructed 4 x daily. e11.65 150 each 5  . Lancets (ACCU-CHEK SOFT TOUCH)  lancets Use as instructed 100 each 12  . metFORMIN (GLUCOPHAGE-XR) 500 MG 24 hr tablet Take 1,000 mg by mouth 2 (two) times daily.    . mirtazapine (REMERON) 30 MG tablet Take 30 mg by mouth at bedtime.     . nitroGLYCERIN (NITROSTAT) 0.4 MG SL tablet Place 1 tablet (0.4 mg total) under the tongue every 5 (five) minutes as needed for chest pain. 25 tablet 3  . omeprazole (PRILOSEC) 20 MG capsule Take 20 mg by mouth daily.   1  . Semaglutide,0.25 or 0.5MG/DOS, (OZEMPIC, 0.25 OR 0.5 MG/DOSE,) 2 MG/1.5ML SOPN Inject 0.5 mg into the skin once a week. 4.5 mL 3  . zolpidem (AMBIEN) 10 MG tablet Take 10 mg by mouth at bedtime.     No current facility-administered medications on file prior to visit.   Allergies  Allergen Reactions  . Amoxicillin Hives    Has patient had a PCN reaction causing immediate rash, facial/tongue/throat swelling, SOB or lightheadedness with hypotension: No Has patient had a PCN reaction causing severe rash involving mucus membranes or skin necrosis: No Has patient had a PCN reaction that required hospitalization: No Has patient had a PCN reaction occurring within the last 10 years: No If all of the above answers are "NO", then may proceed with Cephalosporin use.    Family History  Problem Relation  Age of Onset  . Diabetes Maternal Grandmother   . Thyroid disease Maternal Grandmother   . Hypertension Maternal Grandmother   . Healthy Mother   . Healthy Father   . Healthy Sister   . Healthy Sister     PE: There were no vitals taken for this visit. Wt Readings from Last 3 Encounters:  12/27/20 226 lb 6.4 oz (102.7 kg)  12/21/20 235 lb (106.6 kg)  11/10/20 235 lb (106.6 kg)   Constitutional: overweight, in NAD Eyes: PERRLA, EOMI, no exophthalmos ENT: moist mucous membranes, no thyromegaly, no cervical lymphadenopathy Cardiovascular: RRR, No MRG Respiratory: CTA B Gastrointestinal: abdomen soft, NT, ND, BS+ Musculoskeletal: no deformities, strength intact in all 4 Skin: moist, warm, no rashes Neurological: no tremor with outstretched hands, DTR normal in all 4  ASSESSMENT: 1. DM2, insulin-dependent, uncontrolled, with complications - DR - Autonomic neuropathy - PN - Microalbuminuria  2. HL  3. overweight  PLAN:  1. Patient with longstanding, uncontrolled, type 2 diabetes, on oral antidiabetic regimen with metformin, also GLP-1 receptor agonist and basal-bolus insulin regimen, with Humalog started at last visit.  At that time, HbA1c was very high, at 11.4%, slightly improved from 12.1%.  He was on Victoza, just started with PCP and we discussed about either going up on the Victoza dose to 1.8 mg daily, since he was tolerating it well, or switching to a weekly GLP-1 receptor agonist.  Since last visit, he was able to start Ozempic.  He tolerates the 0.5 mg weekly dose well.  We also increased the metformin to 1000 mg twice a day.  At that time we also discussed about the importance of improving his diet.  He was having only 1 large meal a day, skipping breakfast, and having a smaller lunch.  I advised him to try to distribute his calories throughout the day but also referred him to nutrition.  - I suggested to:  Patient Instructions  Please continue: - Metformin ER 1000 mg  2x a day, with meals - Ozempic 0.5 mg weekly - Lantus 35 units 2x a day - Humalog 10-14 units 15 min before meals  Please return in 3 months with your sugar log.   - we checked his HbA1c: 7%  - advised to check sugars at different times of the day - 4x a day, rotating check times - advised for yearly eye exams >> he is UTD - return to clinic in 3 months  2. HL - Reviewed latest lipid panel from 05/19/2020: 166/132/47/86 - Continues the statin (Lipitor 10 mg daily) without side effects.  3. overweight -She lost 9 pounds since last visit -We will continue Ozempic which should also help with weight loss  Philemon Kingdom, MD PhD [facility]

## 2021-01-11 ENCOUNTER — Encounter: Payer: Self-pay | Admitting: Internal Medicine

## 2021-01-11 NOTE — Progress Notes (Signed)
Received labs from PCP, drawn on 01/04/2021: HbA1c 10.3%, decreased from 12.1% on 08/26/2020 Glucose 61, BUN/creatinine 13/0.96, GFR 111  Previous labs, drawn on 08/26/2020: Cortisol 1.0, dexamethasone 466 (negative DST) TSH 0.50  Pt. canceled an appointment on 12/06/2020 and no showed an appointment on 01/04/2021.  No future appointment scheduled.

## 2021-01-23 ENCOUNTER — Other Ambulatory Visit: Payer: Self-pay

## 2021-01-23 ENCOUNTER — Ambulatory Visit (HOSPITAL_COMMUNITY)
Admission: RE | Admit: 2021-01-23 | Discharge: 2021-01-23 | Disposition: A | Discharge status: Home / Self Care | Payer: BC Managed Care – PPO | Source: Ambulatory Visit | Attending: Family Medicine | Admitting: Family Medicine

## 2021-01-23 DIAGNOSIS — I739 Peripheral vascular disease, unspecified: Secondary | ICD-10-CM | POA: Insufficient documentation

## 2021-02-02 ENCOUNTER — Other Ambulatory Visit: Payer: Self-pay | Admitting: Orthopedic Surgery

## 2021-02-02 DIAGNOSIS — M25561 Pain in right knee: Secondary | ICD-10-CM

## 2021-02-02 DIAGNOSIS — M25361 Other instability, right knee: Secondary | ICD-10-CM

## 2021-02-06 ENCOUNTER — Telehealth: Payer: Self-pay | Admitting: Orthopedic Surgery

## 2021-02-06 ENCOUNTER — Ambulatory Visit (INDEPENDENT_AMBULATORY_CARE_PROVIDER_SITE_OTHER): Payer: BC Managed Care – PPO | Admitting: Vascular Surgery

## 2021-02-06 ENCOUNTER — Other Ambulatory Visit: Payer: Self-pay

## 2021-02-06 ENCOUNTER — Encounter: Payer: Self-pay | Admitting: Vascular Surgery

## 2021-02-06 VITALS — BP 111/77 | HR 112 | Temp 97.5°F | Resp 12 | Ht 75.0 in | Wt 215.0 lb

## 2021-02-06 DIAGNOSIS — R6889 Other general symptoms and signs: Secondary | ICD-10-CM | POA: Diagnosis not present

## 2021-02-06 DIAGNOSIS — R9430 Abnormal result of cardiovascular function study, unspecified: Secondary | ICD-10-CM | POA: Diagnosis not present

## 2021-02-06 NOTE — Progress Notes (Signed)
[facility] [place]  Patient name: [name] MRN: [id number] DOB: [birth year] Sex: male  REASON FOR CONSULT: Evaluation lower extremity ulcerations and abnormal ankle arm index  HPI: [name] is a 25 y.o. male, who is here today for evaluation.  He is here today with his mother.  He has severe Telly Jawad onset diabetes with multiple complications about this.  Ports he has had prior evaluation showing cardiac blockages.  He denies any claudication type symptoms.  He does have severe bilateral peripheral neuropathy.  He occasionally has developed blisters on his feet resulting from his inability to feel his feet.  For approximately 1 year now he has had episodes of small blisters appearing from his knees distally onto his ankles.  These are small and burst and then eventually healed with some scarring.  He does not have any arterial rest pain.  No history of stroke.  Past Medical History:  Diagnosis Date  . Diabetes mellitus, type II ([place])   . Heart murmur   . Hypertension     Family History  Problem Relation Age of Onset  . Diabetes Maternal Grandmother   . Thyroid disease Maternal Grandmother   . Hypertension Maternal Grandmother   . Healthy Mother   . Healthy Father   . Healthy Sister   . Healthy Sister     SOCIAL HISTORY: Social History   Socioeconomic History  . Marital status: Single    Spouse name: Not on file  . Number of children: Not on file  . Years of education: Not on file  . Highest education level: Not on file  Occupational History  . Not on file  Tobacco Use  . Smoking status: Passive Smoke Exposure - Never Smoker  . Smokeless tobacco: Never Used  Vaping Use  . Vaping Use: Never used  Substance and Sexual Activity  . Alcohol use: No  . Drug use: No  . Sexual activity: Not on file  Other Topics Concern  . Not on file  Social History Narrative  . Not on file   Social Determinants of  Health   Financial Resource Strain: Not on file  Food Insecurity: Not on file  Transportation Needs: Not on file  Physical Activity: Not on file  Stress: Not on file  Social Connections: Not on file  Intimate Partner Violence: Not on file    Allergies  Allergen Reactions  . Amoxicillin Hives    Has patient had a PCN reaction causing immediate rash, facial/tongue/throat swelling, SOB or lightheadedness with hypotension: No Has patient had a PCN reaction causing severe rash involving mucus membranes or skin necrosis: No Has patient had a PCN reaction that required hospitalization: No Has patient had a PCN reaction occurring within the last 10 years: No If all of the above answers are "NO", then may proceed with Cephalosporin use.     Current Outpatient Medications  Medication Sig Dispense Refill  . acetaminophen (TYLENOL) 500 MG tablet Take 500 mg by mouth every 6 (six) hours as needed.    Marland Kitchen aspirin EC 81 MG tablet Take 1 tablet (81 mg total) by mouth daily.    Marland Kitchen atorvastatin (LIPITOR) 10 MG tablet Take 1 tablet (10 mg total) by mouth daily. 30 tablet 6  . blood glucose meter kit and supplies KIT Dispense based on patient and insurance preference. Use up to four times daily as directed. (FOR ICD-10 E11.65). 1 each 5  . carvedilol (COREG) 25 MG tablet Take 1  tablet (25 mg total) by mouth 2 (two) times daily. 60 tablet 6  . hydrOXYzine (ATARAX/VISTARIL) 50 MG tablet Take 50 mg by mouth 3 (three) times daily as needed.    Marland Kitchen ibuprofen (ADVIL) 800 MG tablet TAKE 1 TABLET BY MOUTH EVERY 8 HOURS AS NEEDED 90 tablet 0  . insulin glargine (LANTUS SOLOSTAR) 100 UNIT/ML Solostar Pen Inject 35 Units into the skin 2 (two) times daily. 45 mL 3  . insulin lispro (HUMALOG KWIKPEN) 200 UNIT/ML KwikPen Inject under skin 10-14 units 3x a day before meals (Patient taking differently: Inject 15 Units into the skin 3 (three) times daily before meals.) 45 mL 3  . Insulin Pen Needle (CARETOUCH PEN NEEDLES)  31G X 6 MM MISC Use 6x a day 400 each 3  . isosorbide mononitrate (IMDUR) 30 MG 24 hr tablet Take 1 tablet (30 mg total) by mouth daily. 30 tablet 6  . Lancet Devices (ACCU-CHEK SOFTCLIX) lancets Use as instructed 4 x daily. e11.65 150 each 5  . Lancets (ACCU-CHEK SOFT TOUCH) lancets Use as instructed 100 each 12  . metFORMIN (GLUCOPHAGE-XR) 500 MG 24 hr tablet Take 1,000 mg by mouth 2 (two) times daily.    . mirtazapine (REMERON) 30 MG tablet Take 30 mg by mouth at bedtime.     . nitroGLYCERIN (NITROSTAT) 0.4 MG SL tablet Place 1 tablet (0.4 mg total) under the tongue every 5 (five) minutes as needed for chest pain. 25 tablet 3  . omeprazole (PRILOSEC) 20 MG capsule Take 20 mg by mouth daily.   1  . Semaglutide,0.25 or 0.5MG/DOS, (OZEMPIC, 0.25 OR 0.5 MG/DOSE,) 2 MG/1.5ML SOPN Inject 0.5 mg into the skin once a week. 4.5 mL 3  . zolpidem (AMBIEN) 10 MG tablet Take 10 mg by mouth at bedtime.     No current facility-administered medications for this visit.    REVIEW OF SYSTEMS:  [X]  denotes positive finding, [ ]  denotes negative finding Cardiac  Comments:  Chest pain or chest pressure:    Shortness of breath upon exertion: x   Short of breath when lying flat: x   Irregular heart rhythm: x       Vascular    Pain in calf, thigh, or hip brought on by ambulation: x   Pain in feet at night that wakes you up from your sleep:  x   Blood clot in your veins:    Leg swelling:  x       Pulmonary    Oxygen at home:    Productive cough:     Wheezing:         Neurologic    Sudden weakness in arms or legs:  x   Sudden numbness in arms or legs:  x   Sudden onset of difficulty speaking or slurred speech:    Temporary loss of vision in one eye:     Problems with dizziness:         Gastrointestinal    Blood in stool:     Vomited blood:         Genitourinary    Burning when urinating:     Blood in urine:        Psychiatric    Major depression:         Hematologic    Bleeding problems:     Problems with blood clotting too easily:        Skin    Rashes or ulcers:        Constitutional  Fever or chills:      PHYSICAL EXAM: Vitals:   02/06/21 0902  BP: 111/77  Pulse: (!) 112  Resp: 12  Temp: (!) 97.5 F (36.4 C)  TempSrc: Other (Comment)  SpO2: 97%  Weight: 215 lb (97.5 kg)  Height: 6' 3"  (1.905 m)    GENERAL: The patient is a well-nourished male, in no acute distress. The vital signs are documented above. CARDIOVASCULAR: 2+ radial pulses bilaterally.  2+ dorsalis pedis and 1+ posterior tibial pulses bilaterally PULMONARY: There is good air exchange  MUSCULOSKELETAL: There are no major deformities or cyanosis. NEUROLOGIC: No focal weakness or paresthesias are detected. SKIN: Multiple small isolated healed scarring from these small blisters that he develops on his lower extremities. PSYCHIATRIC: The patient has a normal affect.  DATA:  Noninvasive studies from 01/23/2021 were reviewed.  This reveals normal ankle arm index bilaterally.  He has biphasic waveforms bilaterally  MEDICAL ISSUES: Had long discussion with the patient and his mother present.  He has very mild arterial insufficiency with normal ankle arm index and mildly diminished waveforms.  He has normal physical exam regarding his lower extremity pulse status.  Fortunately he does not smoke.  I did explain the lifelong risk of progressive arterial insufficiency due to his Tajia Szeliga onset diabetes.  Explained the critical importance of diabetic management.  I do not feel these ulcerations on his legs are related to arterial insufficiency and he does have adequate flow to heal this.  He may benefit from dermatologic consultation.  He will see [place] again on an as-needed basis   Rosetta Posner, MD [facility] Tel [phone] Pager [phone]  Note: Portions of this report may have been transcribed using voice recognition software.  Every effort has been made  to ensure accuracy; however, inadvertent computerized transcription errors may still be present.

## 2021-02-06 NOTE — Telephone Encounter (Signed)
Complete tear of the ACL with retraction of fibers and edema in the intracondylar notch. Nondisplaced posterior medial meniscus tear. Intrasubstance sprain of the lateral meniscus Small avulsion fracture off the lateral tibial plateau with significant surrounding marrow edema. Nondisplaced fibular head fracture. Osseous contusions as described above Moderate knee joint effusion.   Waiting on cardiac clearance and medical clearance for surgery on knee. He is asking for brace for foot last visit you xrayed foot said no fracture, please advise

## 2021-02-06 NOTE — Telephone Encounter (Signed)
Patient called stating he needs a brace for his leg and foot.  Please call him back at [phone]

## 2021-02-07 NOTE — Telephone Encounter (Signed)
No brace for foot

## 2021-02-07 NOTE — Telephone Encounter (Signed)
No brace needed for foot per Dr Aline Brochure  After ACL repair he will get brace for knee, but not at this point. I have called but no answer no voice mail

## 2021-02-09 NOTE — Telephone Encounter (Signed)
I spoke to his mother, she is on his HIPAA and she said they have seen cardiology, I do see cardiology clearance, but he has not seen his primary care. I have advised mother he needs to see primary care also and his A1C has to be around 7 last checked it was 10.  She understood about brace   She now understands about clearance and A1C  To you FYI

## 2021-03-03 ENCOUNTER — Other Ambulatory Visit: Payer: Self-pay | Admitting: Orthopedic Surgery

## 2021-03-03 DIAGNOSIS — M25361 Other instability, right knee: Secondary | ICD-10-CM

## 2021-03-03 DIAGNOSIS — M25561 Pain in right knee: Secondary | ICD-10-CM

## 2021-03-06 ENCOUNTER — Ambulatory Visit: Payer: BC Managed Care – PPO | Admitting: Internal Medicine

## 2021-03-06 NOTE — Progress Notes (Deleted)
Patient ID: [name], male   DOB: May 05, 1996, 25 y.o.   MRN: [id number]   This visit occurred during the SARS-CoV-2 public health emergency.  Safety protocols were in place, including screening questions prior to the visit, additional usage of staff PPE, and extensive cleaning of exam room while observing appropriate contact time as indicated for disinfecting solutions.   HPI: [name] is a 25 y.o.-year-old male, initially referred by his PCP, Dr. Angelia Brown, returning for follow-up for DM, dx at 25 years old, insulin-dependent since 25 y/o, uncontrolled, with complications (autonomic neuropathy, PN, microalbuminuria, DR).  She saw Dr. Dorris Brown in the past.  Last visit with me 5 months ago.  Reviewed HbA1c levels: 01/04/2021: HbA1c 10.3% Lab Results  Component Value Date   HGBA1C 11.4 (A) 10/18/2020   HGBA1C 9.7 (H) 03/17/2019   HGBA1C 8.6 (H) 11/18/2018   HGBA1C 12.5 08/14/2018   HGBA1C 13 02/11/2017  08/2020: HbA1c 12.1% 04/2020: HbA1c 11.5%  Pt is on a regimen of: - Metformin ER 500 mg 2x a day, with meals - Victoza 1.2 mg daily in am - started by PCP on 09/28/2020 >> 1.8 mg daily - Lantus 77 units at bedtime >> split 35 units 2x a day on 09/28/2020 - Humalog 10-14 units before meals-added 09/2020 He was on Humalog 7-7-9 units before meals  - stopped when started Victoza Metformin IR >> nausea.  Pt checks his sugars 2-3 times a day: - am: 300-317 - 2h after b'fast: n/c - before lunch: 250 - 2h after lunch: n/c - before dinner: 250 - 2h after dinner: n/c - bedtime: 200-310 - nighttime: n/c Lowest sugar was 200; it is unclear at which level he has hypoglycemia awareness. Highest sugar was 350.  Glucometer: Accu-Chek >> ReliOn   Pt's meals are: - Breakfast: if eats b'fast: cornflakes + milk - Lunch: half a sandwich - Dinner: meat + veggies + starch - home cooked - Snacks: not usually He drinks 1.5-2 sweet tea a day.  - no CKD but he has a history of  microalbuminuria, last BUN/creatinine:  01/04/2021: Glucose 61, BUN/creatinine 13/0.96, GFR 111 Lab Results  Component Value Date   BUN 12 03/17/2019   BUN 12 11/19/2018   CREATININE 0.84 03/17/2019   CREATININE 0.70 11/19/2018   ACR: 10/19/2015: ACR 78.8 07/28/2012: ACR 41.8 Not on ACE inhibitor/ARB.  -+ HL; last set of lipids: 10/19/2015: 202/110/63/117 No results found for: CHOL, HDL, LDLCALC, LDLDIRECT, TRIG, CHOLHDL On Lipitor 10.  - last eye exam was in spring 2021: + DR reportedly  - + numbness and tingling in his feet.  Prev. On Reglan. On ASA 81.  Pt has FH of DM in MGM, MGGM, PGF - prediabetes.  She also has a history of HTN, cardiac murmur.  He had tachycardia and palpitations due to autoimmune.  He was started on carvedilol after an ED visit in 2019.  He continues to have shortness of breath and exertional fatigue. Stress test, 2D Echo and heart monitor >> recently normal.  TSH was normal. 08/26/2020: TSH 0.5 Lab Results  Component Value Date   TSH 0.62 03/17/2019   He also had a dexamethasone suppression test that was normal on 08/26/2020: Cortisol 1.0, dexamethasone 466.  He is unemployed.  He walks for exercise but only can walk for 15 to 20 minutes because of shortness of breath.  ROS: Constitutional: no weight gain, + weight loss, + fatigue, no subjective hyperthermia, no subjective hypothermia, no nocturia, + poor sleep Eyes: +  blurry vision, no xerophthalmia ENT: no sore throat, no nodules palpated in neck, no dysphagia, no odynophagia, no hoarseness, no tinnitus, no hypoacusis Cardiovascular: + CP, + SOB, + palpitations, no leg swelling Respiratory: + cough, + SOB, no wheezing Gastrointestinal: no N, no V, no D, no C, + acid reflux Musculoskeletal: + muscle, + joint aches Skin: + rash  - macular, on legs, + hair loss, + easy bruising Neurological: + tremors, no numbness or tingling/no dizziness/+ HAs Psychiatric: no depression, + anxiety + Difficulty  with erections  Past Medical History:  Diagnosis Date  . Diabetes mellitus, type II ([place])   . Heart murmur   . Hypertension    Past Surgical History:  Procedure Laterality Date  . PALATE / UVULA BIOPSY / EXCISION     "growth removed"  . UPPER GASTROINTESTINAL ENDOSCOPY     Done in [place] - 2013 ish   Social History   Socioeconomic History  . Marital status: Single    Spouse name: Not on file  . Number of children: Not on file  . Years of education: Not on file  . Highest education level: Not on file  Occupational History  . Not on file  Tobacco Use  . Smoking status: Passive Smoke Exposure - Never Smoker  . Smokeless tobacco: Never Used  Vaping Use  . Vaping Use: Never used  Substance and Sexual Activity  . Alcohol use: No  . Drug use: No  . Sexual activity: Not on file  Other Topics Concern  . Not on file  Social History Narrative  . Not on file   Social Determinants of Health   Financial Resource Strain: Not on file  Food Insecurity: Not on file  Transportation Needs: Not on file  Physical Activity: Not on file  Stress: Not on file  Social Connections: Not on file  Intimate Partner Violence: Not on file   Current Outpatient Medications on File Prior to Visit  Medication Sig Dispense Refill  . acetaminophen (TYLENOL) 500 MG tablet Take 500 mg by mouth every 6 (six) hours as needed.    Marland Kitchen aspirin EC 81 MG tablet Take 1 tablet (81 mg total) by mouth daily.    Marland Kitchen atorvastatin (LIPITOR) 10 MG tablet Take 1 tablet (10 mg total) by mouth daily. 30 tablet 6  . blood glucose meter kit and supplies KIT Dispense based on patient and insurance preference. Use up to four times daily as directed. (FOR ICD-10 E11.65). 1 each 5  . carvedilol (COREG) 25 MG tablet Take 1 tablet (25 mg total) by mouth 2 (two) times daily. 60 tablet 6  . hydrOXYzine (ATARAX/VISTARIL) 50 MG tablet Take 50 mg by mouth 3 (three) times daily as needed.    Marland Kitchen ibuprofen (ADVIL) 800 MG tablet TAKE 1  TABLET BY MOUTH EVERY 8 HOURS AS NEEDED 90 tablet 0  . insulin glargine (LANTUS SOLOSTAR) 100 UNIT/ML Solostar Pen Inject 35 Units into the skin 2 (two) times daily. 45 mL 3  . insulin lispro (HUMALOG KWIKPEN) 200 UNIT/ML KwikPen Inject under skin 10-14 units 3x a day before meals (Patient taking differently: Inject 15 Units into the skin 3 (three) times daily before meals.) 45 mL 3  . Insulin Pen Needle (CARETOUCH PEN NEEDLES) 31G X 6 MM MISC Use 6x a day 400 each 3  . isosorbide mononitrate (IMDUR) 30 MG 24 hr tablet Take 1 tablet (30 mg total) by mouth daily. 30 tablet 6  . Lancet Devices (ACCU-CHEK SOFTCLIX) lancets Use  as instructed 4 x daily. e11.65 150 each 5  . Lancets (ACCU-CHEK SOFT TOUCH) lancets Use as instructed 100 each 12  . metFORMIN (GLUCOPHAGE-XR) 500 MG 24 hr tablet Take 1,000 mg by mouth 2 (two) times daily.    . mirtazapine (REMERON) 30 MG tablet Take 30 mg by mouth at bedtime.     . nitroGLYCERIN (NITROSTAT) 0.4 MG SL tablet Place 1 tablet (0.4 mg total) under the tongue every 5 (five) minutes as needed for chest pain. 25 tablet 3  . omeprazole (PRILOSEC) 20 MG capsule Take 20 mg by mouth daily.   1  . Semaglutide,0.25 or 0.5MG/DOS, (OZEMPIC, 0.25 OR 0.5 MG/DOSE,) 2 MG/1.5ML SOPN Inject 0.5 mg into the skin once a week. 4.5 mL 3  . zolpidem (AMBIEN) 10 MG tablet Take 10 mg by mouth at bedtime.     No current facility-administered medications on file prior to visit.   Allergies  Allergen Reactions  . Amoxicillin Hives    Has patient had a PCN reaction causing immediate rash, facial/tongue/throat swelling, SOB or lightheadedness with hypotension: No Has patient had a PCN reaction causing severe rash involving mucus membranes or skin necrosis: No Has patient had a PCN reaction that required hospitalization: No Has patient had a PCN reaction occurring within the last 10 years: No If all of the above answers are "NO", then may proceed with Cephalosporin use.    Family  History  Problem Relation Age of Onset  . Diabetes Maternal Grandmother   . Thyroid disease Maternal Grandmother   . Hypertension Maternal Grandmother   . Healthy Mother   . Healthy Father   . Healthy Sister   . Healthy Sister     PE: There were no vitals taken for this visit. Wt Readings from Last 3 Encounters:  02/06/21 215 lb (97.5 kg)  12/27/20 226 lb 6.4 oz (102.7 kg)  12/21/20 235 lb (106.6 kg)   Constitutional: overweight, in NAD Eyes: PERRLA, EOMI, no exophthalmos ENT: moist mucous membranes, no thyromegaly, no cervical lymphadenopathy Cardiovascular: Tachycardia, RR, No MRG Respiratory: CTA B Gastrointestinal: abdomen soft, NT, ND, BS+ Musculoskeletal: no deformities, strength intact in all 4 Skin: moist, warm, + macular rash on bilateral shins and feet Neurological: no tremor with outstretched hands, DTR normal in all 4  ASSESSMENT: 1. DM2, insulin-dependent, uncontrolled, with complications - DR - Autonomic neuropathy - PN - Microalbuminuria  2.  HL  PLAN:  1. Patient with longstanding, uncontrolled, type 2 diabetes, on oral antidiabetic regimen with Metformin also, GLP-1 receptor agonist and basal-bolus insulin, with Humalog restarted at last visit.  At that time, HbA1c was slightly lower, and 11.4%.  He had another HbA1c checked by PCP on 01/04/2021 and this was better, at 10.3%. -At last visit, we also increased Victoza to the target dose of 1.8 mg daily and I recommended Ozempic, if covered (I sent this to his pharmacy.  We also increase Metformin to the target dose of 1000 mg twice a day.  At that time we discussed about the importance of improving his diet.  He usually was eating 1 large meal of the day, dinner, skipping breakfast and having only a small lunch.  He agreed with referral to nutrition at that time and we also started 10 to 14 units of Humalog before meals. -  - I suggested to:  Patient Instructions  Please continue: - Metformin ER 1000 mg 2x  a day, with meals - Victoza 1.8 mg daily in am (if Ozempic is covered,  start this instead: 0.5 mg weekly) - Lantus 35 units 2x a day - Humalog 10-14 units 15 min before meals  Please return in 3 months with your sugar log.   - we checked his HbA1c: 7%  - advised to check sugars at different times of the day - 1x a day, rotating check times - advised for yearly eye exams >> he is UTD - return to clinic in 3 months  2. HL - Reviewed latest lipid panel from ***: *** -He continues Lipitor 10 without side effects   Lucas Kingdom, MD PhD [facility] Endocrinology

## 2021-03-18 ENCOUNTER — Other Ambulatory Visit: Payer: Self-pay | Admitting: Internal Medicine

## 2021-03-19 ENCOUNTER — Other Ambulatory Visit: Payer: Self-pay | Admitting: Internal Medicine
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
## 2021-03-27 ENCOUNTER — Ambulatory Visit: Payer: BC Managed Care – PPO | Admitting: Family Medicine

## 2021-03-27 ENCOUNTER — Encounter: Payer: Self-pay | Admitting: Family Medicine

## 2021-03-27 ENCOUNTER — Other Ambulatory Visit: Payer: Self-pay

## 2021-03-27 VITALS — BP 128/72 | HR 99 | Ht 75.0 in | Wt 217.8 lb

## 2021-03-27 DIAGNOSIS — R000 Tachycardia, unspecified: Secondary | ICD-10-CM | POA: Diagnosis not present

## 2021-03-27 DIAGNOSIS — Z01818 Encounter for other preprocedural examination: Secondary | ICD-10-CM | POA: Diagnosis not present

## 2021-03-27 DIAGNOSIS — R002 Palpitations: Secondary | ICD-10-CM

## 2021-03-27 DIAGNOSIS — R0600 Dyspnea, unspecified: Secondary | ICD-10-CM

## 2021-03-27 DIAGNOSIS — R0789 Other chest pain: Secondary | ICD-10-CM | POA: Diagnosis not present

## 2021-03-27 DIAGNOSIS — R0609 Other forms of dyspnea: Secondary | ICD-10-CM

## 2021-03-27 NOTE — Patient Instructions (Signed)
Medication Instructions:  Continue all current medications.   Labwork: none  Testing/Procedures: none  Follow-Up: 6 months   Any Other Special Instructions Will Be Listed Below (If Applicable).   If you need a refill on your cardiac medications before your next appointment, please call your pharmacy.  

## 2021-04-04 ENCOUNTER — Other Ambulatory Visit: Payer: Self-pay

## 2021-04-04 ENCOUNTER — Ambulatory Visit (HOSPITAL_COMMUNITY): Payer: BC Managed Care – PPO | Attending: Family Medicine | Admitting: Physical Therapy

## 2021-04-04 ENCOUNTER — Encounter (HOSPITAL_COMMUNITY): Payer: Self-pay | Admitting: Physical Therapy

## 2021-04-04 DIAGNOSIS — L97919 Non-pressure chronic ulcer of unspecified part of right lower leg with unspecified severity: Secondary | ICD-10-CM | POA: Insufficient documentation

## 2021-04-04 DIAGNOSIS — E11622 Type 2 diabetes mellitus with other skin ulcer: Secondary | ICD-10-CM | POA: Diagnosis present

## 2021-04-04 DIAGNOSIS — R262 Difficulty in walking, not elsewhere classified: Secondary | ICD-10-CM | POA: Diagnosis present

## 2021-04-04 NOTE — Therapy (Signed)
[place], [place], [address] Phone: [phone]   Fax:  [phone]  Wound Care Evaluation  Patient Details  Name: [name] MRN: [id number] Date of Birth: 11/21/1996 Referring Provider (PT): Coolidge Breeze FNP   Encounter Date: 04/04/2021   PT End of Session - 04/04/21 1423    Visit Number 1    Number of Visits 8    Date for PT Re-Evaluation 05/02/21    Authorization Type BCBS COM PPO - VL 20, no auth    Progress Note Due on Visit 10    PT Start Time 1330    PT Stop Time 1415    PT Time Calculation (min) 45 min    Activity Tolerance Patient tolerated treatment well    Behavior During Therapy [facility] for tasks assessed/performed           Past Medical History:  Diagnosis Date  . Diabetes mellitus, type II ([place])   . Heart murmur   . Hypertension     Past Surgical History:  Procedure Laterality Date  . PALATE / UVULA BIOPSY / EXCISION     "growth removed"  . UPPER GASTROINTESTINAL ENDOSCOPY     Done in [place] - 2013 ish    There were no vitals filed for this visit.     [facility] PT Assessment - 04/04/21 0001      Assessment   Medical Diagnosis right leg wound    Referring Provider (PT) Coolidge Breeze FNP    Prior Therapy no           Wound Therapy - 04/04/21 0001    Subjective Patient reports that the wound started as a small blister that his mom was putting Neosporin on. States it continued to get bigger and was not getting better. This all started over 2.5 months ago. States that he gets blisters frequently in his legs, he currently has a blister along the right pinky toe and this is painful despite his neuropathy. States that  he has no ACL in his right leg and has to wear a brace and they are considering ACL reconstruction. Patient currently on antibiotics that MD put him on last week.    Patient and Family Stated Goals to have wound healed    Date of Onset --   2.5 months ago    Prior Treatments neosporin, gauze, unna boot    Pain Scale 0-10    Pain Score 0-No pain    Evaluation and Treatment Procedures Explained to Patient/Family Yes    Evaluation and Treatment Procedures agreed to    Wound Properties Date First Assessed: 04/04/21 Time First Assessed: 1330 Wound Type: Diabetic ulcer Location: Ankle Location Orientation: Posterior;Right Wound Description (Comments): superior wound Present on Admission: Yes   Dressing Type --   unna boot   Dressing Changed Changed    Dressing Status Old drainage    Dressing Change Frequency PRN    Site / Wound Assessment Pink;Pale;Brown    % Wound base Red or Granulating 20%    % Wound base Other/Granulation Tissue (Comment) 80%    Peri-wound Assessment Erythema (blanchable);Edema    Wound Length (cm) 2.2 cm    Wound Width (cm) 2 cm    Wound Depth (cm) 0.3 cm    Wound Volume (cm^3) 1.32 cm^3    Wound Surface Area (cm^2) 4.4 cm^2    Margins Attached edges (approximated)    Drainage Amount Scant    Drainage  Description Serosanguineous    Treatment Cleansed;Debridement (Selective)    Wound Properties Date First Assessed: 04/04/21 Time First Assessed: 1330 Wound Type: Diabetic ulcer Location: Ankle Location Orientation: Distal;Posterior;Right Wound Description (Comments): inferior wound Present on Admission: Yes   Wound Image View All Images View Images    Dressing Type --   unna boot   Dressing Changed Changed    Dressing Status Old drainage    Dressing Change Frequency PRN    Site / Wound Assessment Pink;Pale;Bleeding    % Wound base Red or Granulating 30%    % Wound base Other/Granulation Tissue (Comment) 70%    Peri-wound Assessment Edema;Erythema (blanchable)    Wound Length (cm) 1.6 cm    Wound Width (cm) 1.2 cm    Wound Depth (cm) 0.2 cm    Wound Volume (cm^3) 0.38 cm^3    Wound Surface Area (cm^2) 1.92 cm^2    Margins Attached edges (approximated)    Drainage Amount Scant    Drainage Description Serosanguineous     Treatment Cleansed;Debridement (Selective)    Selective Debridement - Location posterior right ankle    Selective Debridement - Tools Used Scalpel;Forceps    Selective Debridement - Tissue Removed devitalized tissue and dead skin    Wound Therapy - Clinical Statement Patient presents to therapy with posterior ankle wound that started about 2.5 weeks ago. Patient presents with scabs all over leg from previous blisters. Swelling noted throughout lower leg with most notable in foot. Healing compromised secondary to swelling, diabetes and peripheral neuropathy. Patient would greatly benefit from skilled physical therapy to improve wound healing and reduce risk of infection.    Wound Therapy - Functional Problem List bathing, dressing    Factors Delaying/Impairing Wound Healing Diabetes Mellitus    Hydrotherapy Plan Debridement    Wound Therapy - Frequency 2X / week    Wound Therapy - Current Recommendations PT    Wound Plan debridement and dressing changes, monitor right pinky blister on right foot    Dressing  lotion and vaseoline, xeroform, 2x2, profore lite                 Objective measurements completed on examination: See above findings.             PT Education - 04/04/21 1431    Education Details on elevation, compression garments for swelling, on wound care and POC, on overall healing process and risks associated with future elective surgeries    Person(s) Educated Patient    Methods Explanation    Comprehension Verbalized understanding            PT Short Term Goals - 04/04/21 1428      PT SHORT TERM GOAL #1   Title wounds will be 100% granulated to demonstrate improved wound healing    Time 2    Period Weeks    Status New    Target Date 04/18/21      PT SHORT TERM GOAL #2   Title Patient will be able to demonstrate independence in donning and doffing compression garments to demonstrate independence in self management of lower extremity swelling.    Time 2     Period Weeks    Status New    Target Date 04/18/21             PT Long Term Goals - 04/04/21 1430      PT LONG TERM GOAL #1   Title Wounds will be completely healed to reduce risk of infection  Time 4    Period Weeks    Status New    Target Date 05/02/21      PT LONG TERM GOAL #2   Title Patient will verbal report ksin care routine to reduce risk of future blisters/wounds    Time 4    Period Weeks    Status New    Target Date 05/02/21                Plan - 04/04/21 1425    Personal Factors and Comorbidities Comorbidity 1;Comorbidity 2    Comorbidities diabetes, PVD, peripheral neuropathy    Examination-Activity Limitations Locomotion Level;Bathing    Stability/Clinical Decision Making Stable/Uncomplicated    Clinical Decision Making Low    Rehab Potential Excellent    PT Frequency 2x / week    PT Duration 4 weeks    PT Treatment/Interventions ADLs/Self Care Home Management;Other (comment);Compression bandaging;Patient/family education   wound care, modalities as needed   PT Next Visit Plan continue with dressing changes, measure for compression garments, ankle pumps for HEP    PT Home Exercise Plan none at this time    Consulted and Agree with Plan of Care Patient;Family member/caregiver    Family Member Consulted Mother - Butch Penny           Patient will benefit from skilled therapeutic intervention in order to improve the following deficits and impairments:  Decreased skin integrity,Impaired sensation,Other (comment) (wound care)  Visit Diagnosis: Diabetic ulcer of right lower leg ([place])  Difficulty in walking, not elsewhere classified    Problem List Patient Active Problem List   Diagnosis Date Noted  . Anxiety 12/21/2020  . Atypical chest pain 12/21/2020  . Body mass index (BMI) 30.0-30.9, adult 12/21/2020  . Chronic pain 12/21/2020  . Disorder of teeth and supporting structures, unspecified 12/21/2020  . Epigastric pain 12/21/2020  .  Gastroesophageal reflux disease 12/21/2020  . Headache disorder 12/21/2020  . Insomnia 12/21/2020  . Leukocytosis 12/21/2020  . Oral mucositis (ulcerative), unspecified 12/21/2020  . Panic disorder 12/21/2020  . Poor sleep pattern 12/21/2020  . Sinus tachycardia 12/21/2020  . Systolic murmur 51/88/4166  . Uncontrolled type 1 diabetes mellitus with hyperglycemia ([place]) 11/26/2018  . Personal history of noncompliance with medical treatment, presenting hazards to health 03/15/2017  . Essential hypertension, benign 03/15/2017  . Low back pain 03/14/2017  . Neck pain 03/14/2017  . Uncontrolled diabetes mellitus with diabetic autonomic neuropathy, with long-term current use of insulin ([place]) 03/01/2017  . Type 2 diabetes mellitus with hyperglycemia ([place]) 10/19/2015   2:48 PM, 04/04/21 Jerene Pitch, DPT Physical Therapy with [facility]  [phone] office  [place] [address] [place], [place], [address] Phone: [phone]   Fax:  [phone]  Name: ROMAINE NEVILLE MRN: [id number] Date of Birth: Aug 06, 1996

## 2021-04-06 ENCOUNTER — Ambulatory Visit (HOSPITAL_COMMUNITY): Payer: BC Managed Care – PPO | Admitting: Physical Therapy

## 2021-04-07 ENCOUNTER — Ambulatory Visit (HOSPITAL_COMMUNITY): Payer: BC Managed Care – PPO | Admitting: Physical Therapy

## 2021-04-07 ENCOUNTER — Telehealth (HOSPITAL_COMMUNITY): Payer: Self-pay | Admitting: Physical Therapy
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
## 2021-04-09 ENCOUNTER — Other Ambulatory Visit: Payer: Self-pay | Admitting: Orthopedic Surgery

## 2021-04-09 DIAGNOSIS — M25561 Pain in right knee: Secondary | ICD-10-CM

## 2021-04-09 DIAGNOSIS — M25361 Other instability, right knee: Secondary | ICD-10-CM

## 2021-04-10 ENCOUNTER — Ambulatory Visit (HOSPITAL_COMMUNITY): Payer: BC Managed Care – PPO | Admitting: Physical Therapy

## 2021-04-10 ENCOUNTER — Other Ambulatory Visit: Payer: Self-pay

## 2021-04-10 DIAGNOSIS — R262 Difficulty in walking, not elsewhere classified: Secondary | ICD-10-CM

## 2021-04-10 DIAGNOSIS — E11622 Type 2 diabetes mellitus with other skin ulcer: Secondary | ICD-10-CM

## 2021-04-10 NOTE — Telephone Encounter (Signed)
Rx request 

## 2021-04-10 NOTE — Therapy (Signed)
[place], [place], [address] Phone: [phone]   Fax:  [phone]  Wound Care Therapy  Patient Details  Name: [name] MRN: [id number] Date of Birth: [birth year] Referring Provider (PT): Coolidge Breeze FNP   Encounter Date: 04/10/2021   PT End of Session - 04/10/21 1452    Visit Number 2    Number of Visits 8    Date for PT Re-Evaluation 05/02/21    Authorization Type BCBS COM PPO - [id number], no auth    Progress Note Due on Visit 10    PT Start Time 1315    PT Stop Time 1400    PT Time Calculation (min) 45 min    Activity Tolerance Patient tolerated treatment well    Behavior During Therapy [facility] for tasks assessed/performed           Past Medical History:  Diagnosis Date  . Diabetes mellitus, type II ([place])   . Heart murmur   . Hypertension     Past Surgical History:  Procedure Laterality Date  . PALATE / UVULA BIOPSY / EXCISION     "growth removed"  . UPPER GASTROINTESTINAL ENDOSCOPY     Done in [place] - 2013 ish    There were no vitals filed for this visit.               Wound Therapy - 04/10/21 1409    Subjective Pt returns with mother.  States his pinky toe has been draining alot and is now a big blister with the toenail barely hanging on.  MOther states she's just been appying vaseline and keeping it covered.    Patient and Family Stated Goals to have wound healed    Date of Onset --   2.5 months ago   Prior Treatments neosporin, gauze, unna boot    Pain Scale 0-10    Pain Score 0-No pain    Evaluation and Treatment Procedures Explained to Patient/Family Yes    Evaluation and Treatment Procedures agreed to    Wound Properties Date First Assessed: 04/10/21 Time First Assessed: 1325 Wound Type: Other (Comment) Location: Toe (Comment  which one) Location Orientation: Anterior;Right Wound Description (Comments): Pinky toe Rt foot Present on Admission: No   Wound Image  View All Images View Images    Dressing Type Gauze (Comment)    Dressing Changed New    Dressing Status Intact    Dressing Change Frequency PRN    % Wound base Red or Granulating 10%    % Wound base Other/Granulation Tissue (Comment) 90%    Peri-wound Assessment Intact    Wound Length (cm) 6 cm    Wound Width (cm) 6 cm    Wound Depth (cm) 0 cm    Wound Volume (cm^3) 0 cm^3    Wound Surface Area (cm^2) 36 cm^2    Margins Other (Comment)    Drainage Amount Minimal    Drainage Description Serosanguineous    Treatment Cleansed    Wound Properties Date First Assessed: 04/04/21 Time First Assessed: 1330 Wound Type: Diabetic ulcer Location: Ankle Location Orientation: Posterior;Right Wound Description (Comments): superior wound Present on Admission: Yes   Wound Image View All Images View Images    Dressing Type Impregnated gauze (bismuth);Compression wrap    Dressing Changed Changed    Dressing Status Old drainage    Dressing Change Frequency PRN    Site / Wound Assessment Red;Yellow;Pink    % Wound base  Red or Granulating 80%    % Wound base Yellow/Fibrinous Exudate 20%    Wound Length (cm) 1 cm    Wound Width (cm) 2 cm    Wound Depth (cm) 0.2 cm    Wound Volume (cm^3) 0.4 cm^3    Wound Surface Area (cm^2) 2 cm^2    Margins Attached edges (approximated)    Drainage Amount Scant    Drainage Description Serosanguineous    Treatment Cleansed;Debridement (Selective)    Wound Properties Date First Assessed: 04/04/21 Time First Assessed: 1330 Wound Type: Diabetic ulcer Location: Ankle Location Orientation: Distal;Posterior;Right Wound Description (Comments): inferior wound Present on Admission: Yes   Dressing Type Impregnated gauze (bismuth);Compression wrap    Dressing Changed Changed    Dressing Status Old drainage    Dressing Change Frequency PRN    Site / Wound Assessment Yellow;Red;Pink    % Wound base Red or Granulating 75%    % Wound base Yellow/Fibrinous Exudate 25%     Peri-wound Assessment Intact    Wound Length (cm) 0.5 cm    Wound Width (cm) 1 cm    Wound Depth (cm) 0.2 cm    Wound Volume (cm^3) 0.1 cm^3    Wound Surface Area (cm^2) 0.5 cm^2    Margins Attached edges (approximated)    Drainage Amount Scant    Drainage Description Serosanguineous    Treatment Cleansed;Debridement (Selective)    Selective Debridement - Location posterior right ankle    Selective Debridement - Tools Used Scalpel;Forceps    Selective Debridement - Tissue Removed devitalized tissue and dead skin    Wound Therapy - Clinical Statement Pt with noted degloving present from apparent blistered area/toenail hanging with some drainage noted.  Mom had placed gauze on with noted drainage.  Requested order to begin treatment on this but only cleansed and dressed this session.  Some skin did deglove as well as the toenail as therapist was cleansing.  Minimal drainage but raw tissue remains.  Covered with xerform , gauze and toe bandages.  Removed bandage and cleansed LE well.  Posterior wounds are healing well with reduction in size and increase in granulation.  Able to debride away remainder of slough from both wound beds.  Continued with profore lite.  Instructed patient to wear loose shoes, i.e bedroom shoes at all times as feel he may have hit his toe on something and not felt it.  Informed that PT order received for strengthening but will wait until wound is healed.    Wound Therapy - Functional Problem List bathing, dressing    Factors Delaying/Impairing Wound Healing Diabetes Mellitus    Hydrotherapy Plan Debridement    Wound Therapy - Frequency 2X / week    Wound Therapy - Current Recommendations PT    Wound Plan debridement and dressing changes, await new orders to treat pinky toe.    Dressing  posterior LE:  xeroform, 2X2, profore lite, #5 netting    Dressing Toe: xeroform, toe bandage, #1 netting                     PT Short Term Goals - 04/04/21 1428      PT  SHORT TERM GOAL #1   Title wounds will be 100% granulated to demonstrate improved wound healing    Time 2    Period Weeks    Status New    Target Date 04/18/21      PT SHORT TERM GOAL #2   Title Patient will be able  to demonstrate independence in donning and doffing compression garments to demonstrate independence in self management of lower extremity swelling.    Time 2    Period Weeks    Status New    Target Date 04/18/21             PT Long Term Goals - 04/04/21 1430      PT LONG TERM GOAL #1   Title Wounds will be completely healed to reduce risk of infection    Time 4    Period Weeks    Status New    Target Date 05/02/21      PT LONG TERM GOAL #2   Title Patient will verbal report ksin care routine to reduce risk of future blisters/wounds    Time 4    Period Weeks    Status New    Target Date 05/02/21                  Patient will benefit from skilled therapeutic intervention in order to improve the following deficits and impairments:     Visit Diagnosis: No diagnosis found.     Problem List Patient Active Problem List   Diagnosis Date Noted  . Anxiety 12/21/2020  . Atypical chest pain 12/21/2020  . Body mass index (BMI) 30.0-30.9, adult 12/21/2020  . Chronic pain 12/21/2020  . Disorder of teeth and supporting structures, unspecified 12/21/2020  . Epigastric pain 12/21/2020  . Gastroesophageal reflux disease 12/21/2020  . Headache disorder 12/21/2020  . Insomnia 12/21/2020  . Leukocytosis 12/21/2020  . Oral mucositis (ulcerative), unspecified 12/21/2020  . Panic disorder 12/21/2020  . Poor sleep pattern 12/21/2020  . Sinus tachycardia 12/21/2020  . Systolic murmur 16/55/3748  . Uncontrolled type 1 diabetes mellitus with hyperglycemia ([place]) 11/26/2018  . Personal history of noncompliance with medical treatment, presenting hazards to health 03/15/2017  . Essential hypertension, benign 03/15/2017  . Low back pain 03/14/2017  . Neck pain  03/14/2017  . Uncontrolled diabetes mellitus with diabetic autonomic neuropathy, with long-term current use of insulin ([place]) 03/01/2017  . Type 2 diabetes mellitus with hyperglycemia ([place]) 10/19/2015    Teena Irani, PTA/CLT [phone]  Teena Irani 04/10/2021, 2:53 PM  [place] [address] [place], [place], [address] Phone: [phone]   Fax:  [phone]  Name: [name] MRN: [id number] Date of Birth: 1996/07/12

## 2021-04-12 ENCOUNTER — Ambulatory Visit (HOSPITAL_COMMUNITY): Payer: BC Managed Care – PPO | Admitting: Physical Therapy

## 2021-04-12 ENCOUNTER — Other Ambulatory Visit: Payer: Self-pay

## 2021-04-12 DIAGNOSIS — L97919 Non-pressure chronic ulcer of unspecified part of right lower leg with unspecified severity: Secondary | ICD-10-CM

## 2021-04-12 DIAGNOSIS — R262 Difficulty in walking, not elsewhere classified: Secondary | ICD-10-CM

## 2021-04-12 DIAGNOSIS — E11622 Type 2 diabetes mellitus with other skin ulcer: Secondary | ICD-10-CM | POA: Diagnosis not present

## 2021-04-12 NOTE — Therapy (Signed)
[place], [place], [address] Phone: [phone]   Fax:  [phone]  Wound Care Therapy  Patient Details  Name: [name] MRN: [id number] Date of Birth: 1996-04-18 Referring Provider (PT): Coolidge Breeze FNP   Encounter Date: 04/12/2021   PT End of Session - 04/12/21 1413    Visit Number 3    Number of Visits 8    Date for PT Re-Evaluation 05/02/21    Authorization Type BCBS COM PPO - [id number], no auth    Progress Note Due on Visit 10    PT Start Time 1136    PT Stop Time 1232    PT Time Calculation (min) 56 min    Activity Tolerance Patient tolerated treatment well    Behavior During Therapy [facility] for tasks assessed/performed           Past Medical History:  Diagnosis Date  . Diabetes mellitus, type II ([place])   . Heart murmur   . Hypertension     Past Surgical History:  Procedure Laterality Date  . PALATE / UVULA BIOPSY / EXCISION     "growth removed"  . UPPER GASTROINTESTINAL ENDOSCOPY     Done in [place] - 2013 ish    There were no vitals filed for this visit.               Wound Therapy - 04/12/21 0001    Subjective Patient states no pain in LE. Dressing felt fine.    Patient and Family Stated Goals to have wound healed    Date of Onset --   2.5 months ago   Prior Treatments neosporin, gauze, unna boot    Pain Scale 0-10    Pain Score 0-No pain    Evaluation and Treatment Procedures Explained to Patient/Family Yes    Evaluation and Treatment Procedures agreed to    Wound Properties Date First Assessed: 04/10/21 Time First Assessed: 1325 Wound Type: Other (Comment) Location: Toe (Comment  which one) Location Orientation: Anterior;Right Wound Description (Comments): Pinky toe Rt foot Present on Admission: No   Dressing Type Gauze (Comment);Impregnated gauze (bismuth)   xeroform, toe wrap   Dressing Changed Changed    Dressing Status Intact    Dressing Change Frequency PRN     Site / Wound Assessment Red;Granulation tissue    % Wound base Red or Granulating 100%    Peri-wound Assessment Intact    Margins Unattached edges (unapproximated)    Drainage Amount Minimal    Drainage Description Serosanguineous    Treatment Cleansed;Debridement (Selective)    Wound Properties Date First Assessed: 04/04/21 Time First Assessed: 1330 Wound Type: Diabetic ulcer Location: Ankle Location Orientation: Posterior;Right Wound Description (Comments): superior wound Present on Admission: Yes   Dressing Type Impregnated gauze (bismuth);Compression wrap    Dressing Changed Changed    Dressing Status Intact;Old drainage    Dressing Change Frequency PRN    Site / Wound Assessment Red;Pink;Yellow    % Wound base Red or Granulating 90%    % Wound base Yellow/Fibrinous Exudate 10%    Margins Attached edges (approximated)    Drainage Amount Scant    Drainage Description Serosanguineous    Treatment Cleansed;Debridement (Selective)    Wound Properties Date First Assessed: 04/04/21 Time First Assessed: 1330 Wound Type: Diabetic ulcer Location: Ankle Location Orientation: Distal;Posterior;Right Wound Description (Comments): inferior wound Present on Admission: Yes   Dressing Type Impregnated gauze (bismuth);Compression wrap    Dressing Changed  Changed    Dressing Status Old drainage    Dressing Change Frequency PRN    Site / Wound Assessment Red;Pink;Yellow    % Wound base Red or Granulating 90%    % Wound base Yellow/Fibrinous Exudate 10%    Peri-wound Assessment Intact    Margins Attached edges (approximated)    Drainage Amount Scant    Drainage Description Serosanguineous    Treatment Cleansed;Debridement (Selective)    Selective Debridement - Location posterior right ankle, 5th digit    Selective Debridement - Tools Used Scalpel;Forceps;Scissors    Selective Debridement - Tissue Removed devitalized tissue and dead skin    Wound Therapy - Clinical Statement Received order for R  5th digit wound care. Patient with devitalized tissue where prior blister was on lateral, dorsal and plantar surface of R foot. Patient tolerates debridement of tissue and minimum amounts of slough from wound bed. Patient underlying tissue appears healthy and is bandaged with xeroform and toe wrap to 5th digit. Patient's posterior wounds are healing well with increased granulation tissue. Minimal amounts of slough left following debridement. Applied xeroform, 2x2 and profore light to RLE. Patient will continue to benefit from skilled physical therapy in order to promote healthy environment for wound healing.    Wound Therapy - Functional Problem List bathing, dressing    Factors Delaying/Impairing Wound Healing Diabetes Mellitus    Hydrotherapy Plan Debridement    Wound Therapy - Frequency 2X / week    Wound Therapy - Current Recommendations PT    Wound Plan debridement and dressing changes, await new orders to treat pinky toe.    Dressing  posterior LE:  xeroform, 2X2, profore lite, #5 netting    Dressing Toe: xeroform, toe bandage, #1 netting                     PT Short Term Goals - 04/04/21 1428      PT SHORT TERM GOAL #1   Title wounds will be 100% granulated to demonstrate improved wound healing    Time 2    Period Weeks    Status New    Target Date 04/18/21      PT SHORT TERM GOAL #2   Title Patient will be able to demonstrate independence in donning and doffing compression garments to demonstrate independence in self management of lower extremity swelling.    Time 2    Period Weeks    Status New    Target Date 04/18/21             PT Long Term Goals - 04/04/21 1430      PT LONG TERM GOAL #1   Title Wounds will be completely healed to reduce risk of infection    Time 4    Period Weeks    Status New    Target Date 05/02/21      PT LONG TERM GOAL #2   Title Patient will verbal report ksin care routine to reduce risk of future blisters/wounds    Time 4     Period Weeks    Status New    Target Date 05/02/21                 Plan - 04/12/21 1414    Clinical Impression Statement see above    Personal Factors and Comorbidities Comorbidity 1;Comorbidity 2    Comorbidities diabetes, PVD, peripheral neuropathy    Examination-Activity Limitations Locomotion Level;Bathing    Stability/Clinical Decision Making Stable/Uncomplicated  Rehab Potential Excellent    PT Frequency 2x / week    PT Duration 4 weeks    PT Treatment/Interventions ADLs/Self Care Home Management;Other (comment);Compression bandaging;Patient/family education   wound care, modalities as needed   PT Next Visit Plan continue with dressing changes, measure for compression garments, ankle pumps for HEP    PT Home Exercise Plan none at this time    Consulted and Agree with Plan of Care Patient;Family member/caregiver    Family Member Consulted Mother - Butch Penny           Patient will benefit from skilled therapeutic intervention in order to improve the following deficits and impairments:  Decreased skin integrity,Impaired sensation,Other (comment) (wound care)  Visit Diagnosis: Diabetic ulcer of right lower leg ([place])  Difficulty in walking, not elsewhere classified     Problem List Patient Active Problem List   Diagnosis Date Noted  . Anxiety 12/21/2020  . Atypical chest pain 12/21/2020  . Body mass index (BMI) 30.0-30.9, adult 12/21/2020  . Chronic pain 12/21/2020  . Disorder of teeth and supporting structures, unspecified 12/21/2020  . Epigastric pain 12/21/2020  . Gastroesophageal reflux disease 12/21/2020  . Headache disorder 12/21/2020  . Insomnia 12/21/2020  . Leukocytosis 12/21/2020  . Oral mucositis (ulcerative), unspecified 12/21/2020  . Panic disorder 12/21/2020  . Poor sleep pattern 12/21/2020  . Sinus tachycardia 12/21/2020  . Systolic murmur 11/08/5207  . Uncontrolled type 1 diabetes mellitus with hyperglycemia ([place]) 11/26/2018  . Personal  history of noncompliance with medical treatment, presenting hazards to health 03/15/2017  . Essential hypertension, benign 03/15/2017  . Low back pain 03/14/2017  . Neck pain 03/14/2017  . Uncontrolled diabetes mellitus with diabetic autonomic neuropathy, with long-term current use of insulin ([place]) 03/01/2017  . Type 2 diabetes mellitus with hyperglycemia ([place]) 10/19/2015    2:32 PM, 04/12/21 Mearl Latin PT, DPT Physical Therapist at [facility] [place], [place], [address] Phone: [phone]   Fax:  [phone]  Name: [name] MRN: [id number] Date of Birth: [birth year]

## 2021-04-14 ENCOUNTER — Ambulatory Visit (HOSPITAL_COMMUNITY): Payer: BC Managed Care – PPO

## 2021-04-14 ENCOUNTER — Telehealth (HOSPITAL_COMMUNITY): Payer: Self-pay | Admitting: Physical Therapy

## 2021-04-14 NOTE — Telephone Encounter (Signed)
Mother called with concerns about increased drainage from right toe/lateral foot. Mother described drainage as clear fluid with no blood, smell or color to it. Patient's mother reassured that drainage is normal part of healing and with new wound we need to find best dressings for wound drainage. Instructed mother to change soaked through bandages, wash leg/wounds with basic soap and water. Apply Vaseline and wrap from base of foot up to the calve with gauze. Instructed to change bandages as needed if soaks through. Instructed patient to call with any more concerns.   12:29 PM, 04/14/21 Jerene Pitch, DPT Physical Therapy with [facility]  [phone] office

## 2021-04-17 ENCOUNTER — Ambulatory Visit (HOSPITAL_COMMUNITY): Payer: BC Managed Care – PPO | Admitting: Physical Therapy

## 2021-04-19 ENCOUNTER — Other Ambulatory Visit: Payer: Self-pay

## 2021-04-19 ENCOUNTER — Ambulatory Visit (HOSPITAL_COMMUNITY): Payer: BC Managed Care – PPO | Admitting: Physical Therapy

## 2021-04-19 DIAGNOSIS — E11622 Type 2 diabetes mellitus with other skin ulcer: Secondary | ICD-10-CM

## 2021-04-19 DIAGNOSIS — R262 Difficulty in walking, not elsewhere classified: Secondary | ICD-10-CM

## 2021-04-19 NOTE — Therapy (Signed)
[facility] [address] [place], [place], [address] Phone: [phone]   Fax:  [phone]  Wound Care Therapy  Patient Details  Name: [name] MRN: [id number] Date of Birth: 1996/07/26 Referring Provider (PT): Wilmon Pali FNP   Encounter Date: 04/19/2021   PT End of Session - 04/19/21 1557    Visit Number 4    Number of Visits 8    Date for PT Re-Evaluation 05/02/21    Authorization Type BCBS COM PPO - [id number], no auth    Progress Note Due on Visit 10    PT Start Time 1320    PT Stop Time 1410    PT Time Calculation (min) 50 min    Activity Tolerance Patient tolerated treatment well    Behavior During Therapy [facility] for tasks assessed/performed           Past Medical History:  Diagnosis Date  . Diabetes mellitus, type II (HCC)   . Heart murmur   . Hypertension     Past Surgical History:  Procedure Laterality Date  . PALATE / UVULA BIOPSY / EXCISION     "growth removed"  . UPPER GASTROINTESTINAL ENDOSCOPY     Done in [place] - 2013 ish    There were no vitals filed for this visit.               Wound Therapy - 04/19/21 1430    Subjective Patient states no pain in LE. Dressing felt fine.    Patient and Family Stated Goals to have wound healed    Date of Onset --   2.5 months ago   Prior Treatments neosporin, gauze, unna boot    Pain Scale 0-10    Pain Score 0-No pain    Evaluation and Treatment Procedures Explained to Patient/Family Yes    Evaluation and Treatment Procedures agreed to    Wound Properties Date First Assessed: 04/10/21 Time First Assessed: 1325 Wound Type: Other (Comment) Location: Toe (Comment  which one) Location Orientation: Anterior;Right Wound Description (Comments): Rt lateral foot at MTP Present on Admission: No   Wound Image View All Images View Images    Dressing Changed Changed    Dressing Status Intact    Dressing Change Frequency PRN    % Wound base Red or  Granulating 25%    % Wound base Black/Eschar 75%    Peri-wound Assessment Intact    Wound Length (cm) 3.4 cm    Wound Width (cm) 1.5 cm    Wound Depth (cm) 0.1 cm    Wound Volume (cm^3) 0.51 cm^3    Wound Surface Area (cm^2) 5.1 cm^2    Margins Unattached edges (unapproximated)    Drainage Amount Minimal    Drainage Description Serosanguineous    Treatment Cleansed;Debridement (Selective)    Wound Properties Date First Assessed: 04/19/21 Time First Assessed: 0230 Wound Type: Other (Comment) Location: Toe (Comment  which one) Location Orientation: Right Wound Description (Comments): lateral Rt pinky toe   Dressing Type Gauze (Comment)    Dressing Changed Changed    Dressing Status Intact    Site / Wound Assessment Red;Pink    % Wound base Red or Granulating 90%    % Wound base Yellow/Fibrinous Exudate 10%    Peri-wound Assessment Intact;Erythema (blanchable)    Wound Length (cm) 2 cm    Wound Width (cm) 0.8 cm    Wound Depth (cm) 0 cm    Wound Volume (cm^3) 0 cm^3  Wound Surface Area (cm^2) 1.6 cm^2    Drainage Amount Scant    Drainage Description Serous    Treatment Cleansed;Debridement (Selective)    Wound Properties Date First Assessed: 04/12/21 Time First Assessed: 0230 Wound Type: Other (Comment) Location Orientation: Right Wound Description (Comments): Rt medial pinky toe Present on Admission: No   Dressing Type None    Dressing Changed New    Dressing Change Frequency PRN    Site / Wound Assessment Red    % Wound base Red or Granulating 100%    Peri-wound Assessment Intact    Wound Length (cm) 2 cm    Wound Width (cm) 1 cm    Wound Depth (cm) 0 cm    Wound Volume (cm^3) 0 cm^3    Wound Surface Area (cm^2) 2 cm^2    Margins Attached edges (approximated)    Drainage Amount None    Treatment Cleansed    Wound Properties Date First Assessed: 04/19/21 Time First Assessed: 0230 Wound Type: Other (Comment) Location: Toe (Comment  which one) Location Orientation: Right  Wound Description (Comments): lateral fourth toe Present on Admission: No   Wound Image View All Images View Images    Dressing Type None    Dressing Changed New    Site / Wound Assessment Pink;Red;Yellow;Pale;Dusky    % Wound base Red or Granulating 0%    % Wound base Other/Granulation Tissue (Comment) 100%    Peri-wound Assessment Maceration    Wound Length (cm) 1 cm    Wound Width (cm) 0.5 cm    Wound Depth (cm) 0.3 cm    Wound Volume (cm^3) 0.15 cm^3    Wound Surface Area (cm^2) 0.5 cm^2    Drainage Amount Minimal    Drainage Description Serosanguineous    Treatment Cleansed    Wound Properties Date First Assessed: 04/04/21 Time First Assessed: 1330 Wound Type: Diabetic ulcer Location: Ankle Location Orientation: Posterior;Right Wound Description (Comments): superior wound Present on Admission: Yes   Wound Image View All Images View Images    Dressing Type Impregnated gauze (bismuth)    Dressing Changed Changed    Dressing Status Intact    Dressing Change Frequency PRN    Site / Wound Assessment Red;Yellow    % Wound base Red or Granulating 95%    % Wound base Yellow/Fibrinous Exudate 5%    Wound Length (cm) 1 cm    Wound Width (cm) 0.9 cm    Wound Depth (cm) 0.2 cm    Wound Volume (cm^3) 0.18 cm^3    Wound Surface Area (cm^2) 0.9 cm^2    Margins Attached edges (approximated)    Drainage Amount Scant    Drainage Description Serosanguineous    Treatment Cleansed;Debridement (Selective)    Wound Properties Date First Assessed: 04/04/21 Time First Assessed: 1330 Wound Type: Diabetic ulcer Location: Ankle Location Orientation: Distal;Posterior;Right Wound Description (Comments): inferior wound Present on Admission: Yes   Dressing Type Impregnated gauze (bismuth)    Dressing Changed Changed    Dressing Status Clean    Dressing Change Frequency PRN    Site / Wound Assessment Red    % Wound base Red or Granulating 100%    Peri-wound Assessment Intact    Wound Length (cm) 0.5 cm     Wound Width (cm) 0.5 cm    Wound Depth (cm) 0.2 cm    Wound Volume (cm^3) 0.05 cm^3    Wound Surface Area (cm^2) 0.25 cm^2    Margins Attached edges (approximated)    Drainage  Amount Scant    Drainage Description Serosanguineous    Treatment Cleansed;Debridement (Selective)    Selective Debridement - Location posterior right ankle, 5th digit    Selective Debridement - Tools Used Scalpel;Forceps;Scissors    Selective Debridement - Tissue Removed devitalized tissue and dead skin    Wound Therapy - Clinical Statement Wound appearance worse for foot/toes and improved for posterior LE.  Latereal foot now with eschar present as well as new wound on inside of fourth digit. Appears to have been caused by poosible pressure and maceration present perimeter of wound.  Lateral portion broken down into 2 wounds at this point as there is healing around that made a distinction.  Discussed importance with pt and mother regarding proper footwear and care of feet.  Suggested a shoe with larger toe box.  Called [facility] (3:45pm) and went to after hour urgent care.  Lavada Mesi stated to call back to [facility] but again was directed to urgent care messaging.  Attempt to contact Dr. Angelia Mould unsuccessful.  We will need an order to treat fourth toe so new order faxed to office today along with letter regarding concerns.    Wound Therapy - Functional Problem List bathing, dressing    Factors Delaying/Impairing Wound Healing Diabetes Mellitus    Hydrotherapy Plan Debridement    Wound Therapy - Frequency 2X / week    Wound Therapy - Current Recommendations PT    Wound Plan debridement and dressing changes, await new orders to treat fourth toe.    Dressing  posterior LE:  xeroform, 2X2,    Dressing Toe: xeroform, toe bandage, #1 netting    Dressing 4th toe silver hydrofiber    Manual Therapy lateral foot:  medihoney hydrogel sheet, 2X2, medipore                     PT Short Term Goals - 04/04/21  1428      PT SHORT TERM GOAL #1   Title wounds will be 100% granulated to demonstrate improved wound healing    Time 2    Period Weeks    Status New    Target Date 04/18/21      PT SHORT TERM GOAL #2   Title Patient will be able to demonstrate independence in donning and doffing compression garments to demonstrate independence in self management of lower extremity swelling.    Time 2    Period Weeks    Status New    Target Date 04/18/21             PT Long Term Goals - 04/04/21 1430      PT LONG TERM GOAL #1   Title Wounds will be completely healed to reduce risk of infection    Time 4    Period Weeks    Status New    Target Date 05/02/21      PT LONG TERM GOAL #2   Title Patient will verbal report ksin care routine to reduce risk of future blisters/wounds    Time 4    Period Weeks    Status New    Target Date 05/02/21                  Patient will benefit from skilled therapeutic intervention in order to improve the following deficits and impairments:     Visit Diagnosis: Diabetic ulcer of right lower leg ([place])  Difficulty in walking, not elsewhere classified     Problem List Patient Active Problem  List   Diagnosis Date Noted  . Anxiety 12/21/2020  . Atypical chest pain 12/21/2020  . Body mass index (BMI) 30.0-30.9, adult 12/21/2020  . Chronic pain 12/21/2020  . Disorder of teeth and supporting structures, unspecified 12/21/2020  . Epigastric pain 12/21/2020  . Gastroesophageal reflux disease 12/21/2020  . Headache disorder 12/21/2020  . Insomnia 12/21/2020  . Leukocytosis 12/21/2020  . Oral mucositis (ulcerative), unspecified 12/21/2020  . Panic disorder 12/21/2020  . Poor sleep pattern 12/21/2020  . Sinus tachycardia 12/21/2020  . Systolic murmur 42/68/3419  . Uncontrolled type 1 diabetes mellitus with hyperglycemia ([place]) 11/26/2018  . Personal history of noncompliance with medical treatment, presenting hazards to health 03/15/2017  .  Essential hypertension, benign 03/15/2017  . Low back pain 03/14/2017  . Neck pain 03/14/2017  . Uncontrolled diabetes mellitus with diabetic autonomic neuropathy, with long-term current use of insulin ([place]) 03/01/2017  . Type 2 diabetes mellitus with hyperglycemia ([place]) 10/19/2015   Teena Irani, PTA/CLT [phone]  Teena Irani 04/19/2021, 3:58 PM  [facility] [address] [place], [place], [address] Phone: [phone]   Fax:  [phone]  Name: DEMITRIS POKORNY MRN: [id number] Date of Birth: 1996/01/19

## 2021-04-21 ENCOUNTER — Ambulatory Visit (HOSPITAL_COMMUNITY): Payer: BC Managed Care – PPO

## 2021-04-24 ENCOUNTER — Ambulatory Visit (HOSPITAL_COMMUNITY): Payer: BC Managed Care – PPO | Attending: Family Medicine | Admitting: Physical Therapy

## 2021-04-24 ENCOUNTER — Other Ambulatory Visit: Payer: Self-pay

## 2021-04-24 DIAGNOSIS — E11622 Type 2 diabetes mellitus with other skin ulcer: Secondary | ICD-10-CM | POA: Diagnosis present

## 2021-04-24 DIAGNOSIS — L97919 Non-pressure chronic ulcer of unspecified part of right lower leg with unspecified severity: Secondary | ICD-10-CM | POA: Diagnosis present

## 2021-04-24 DIAGNOSIS — R262 Difficulty in walking, not elsewhere classified: Secondary | ICD-10-CM | POA: Diagnosis present

## 2021-04-24 NOTE — Therapy (Signed)
[facility] [place], [place], [address] Phone: [phone]   Fax:  [phone]  Wound Care Therapy  Patient Details  Name: [name] MRN: [id number] Date of Birth: 1996-11-13 Referring Provider (PT): Coolidge Breeze FNP   Encounter Date: 04/24/2021   PT End of Session - 04/24/21 1650    Visit Number 5    Number of Visits 8    Date for PT Re-Evaluation 05/02/21    Authorization Type BCBS COM PPO - [id number], no auth    Progress Note Due on Visit 10    PT Start Time 1325    PT Stop Time 1415    PT Time Calculation (min) 50 min    Activity Tolerance Patient tolerated treatment well    Behavior During Therapy [facility] for tasks assessed/performed           Past Medical History:  Diagnosis Date  . Diabetes mellitus, type II ([place])   . Heart murmur   . Hypertension     Past Surgical History:  Procedure Laterality Date  . PALATE / UVULA BIOPSY / EXCISION     "growth removed"  . UPPER GASTROINTESTINAL ENDOSCOPY     Done in [place] - 2013 ish    There were no vitals filed for this visit.               Wound Therapy - 04/24/21 1633    Subjective Mother states she changed it yesterday due to drainage.  No pain or issues.  pt reports he stayed of it more and wore his bedroom shoes while in the house.    Patient and Family Stated Goals to have wound healed    Date of Onset --   2.5 months ago   Prior Treatments neosporin, gauze, unna boot    Evaluation and Treatment Procedures Explained to Patient/Family Yes    Evaluation and Treatment Procedures agreed to    Wound Properties Date First Assessed: 04/19/21 Time First Assessed: 0230 Wound Type: Other (Comment) Location: Toe (Comment  which one) Location Orientation: Right Wound Description (Comments): lateral Rt pinky toe   Dressing Type Impregnated gauze (bismuth)    Dressing Changed Changed    Dressing Status Intact    Site / Wound Assessment Red;Pink     % Wound base Red or Granulating 95%    % Wound base Yellow/Fibrinous Exudate 5%    Peri-wound Assessment Intact    Drainage Amount Scant    Drainage Description Serous    Treatment Cleansed;Debridement (Selective)    Wound Properties Date First Assessed: 04/12/21 Time First Assessed: 0230 Wound Type: Other (Comment) Location Orientation: Right Wound Description (Comments): Rt medial pinky toe Present on Admission: No   Dressing Type None    Dressing Changed New    Dressing Change Frequency PRN    Site / Wound Assessment Red    % Wound base Red or Granulating 100%    Peri-wound Assessment Intact    Margins Attached edges (approximated)    Drainage Amount Scant    Treatment Cleansed    Wound Properties Date First Assessed: 04/19/21 Time First Assessed: 0230 Wound Type: Other (Comment) Location: Toe (Comment  which one) Location Orientation: Right Wound Description (Comments): lateral fourth toe Present on Admission: No   Dressing Type Silver hydrofiber    Dressing Changed Changed    Site / Wound Assessment Pink;Red;Yellow    % Wound base Red or Granulating 20%    %  Wound base Yellow/Fibrinous Exudate 80%    Peri-wound Assessment Maceration    Drainage Amount Minimal    Drainage Description Serosanguineous    Treatment Cleansed;Debridement (Selective)    Wound Properties Date First Assessed: 04/10/21 Time First Assessed: 1325 Wound Type: Other (Comment) Location: Toe (Comment  which one) Location Orientation: Anterior;Right Wound Description (Comments): Rt lateral foot at MTP Present on Admission: No   Dressing Type Gauze (Comment)   medihoney alginate   Dressing Changed Changed    Dressing Status Intact    Dressing Change Frequency PRN    % Wound base Red or Granulating 10%    % Wound base Black/Eschar 90%    Peri-wound Assessment Intact    Drainage Amount Scant    Drainage Description Serous    Treatment Cleansed;Debridement (Selective)    Wound Properties Date First Assessed:  04/04/21 Time First Assessed: 1330 Wound Type: Diabetic ulcer Location: Ankle Location Orientation: Posterior;Right Wound Description (Comments): superior wound Present on Admission: Yes   Dressing Type Impregnated gauze (bismuth)    Dressing Changed Changed    Dressing Status Intact    Dressing Change Frequency PRN    Site / Wound Assessment Red    % Wound base Red or Granulating 100%    Margins Attached edges (approximated)    Drainage Amount Scant    Drainage Description Serosanguineous    Treatment Cleansed;Debridement (Selective)    Wound Properties Date First Assessed: 04/04/21 Time First Assessed: 1330 Wound Type: Diabetic ulcer Location: Ankle Location Orientation: Distal;Posterior;Right Wound Description (Comments): inferior wound Present on Admission: Yes   Dressing Type Impregnated gauze (bismuth)    Dressing Changed Changed    Dressing Status Clean    Dressing Change Frequency PRN    Site / Wound Assessment Red    % Wound base Red or Granulating 100%    Peri-wound Assessment Intact    Margins Attached edges (approximated)    Drainage Amount Scant    Drainage Description Serous;Serosanguineous    Treatment Cleansed;Debridement (Selective)    Selective Debridement - Location posterior right ankle, 4th, 5th digit and lateral foot    Selective Debridement - Tools Used Scalpel;Forceps;Scissors    Selective Debridement - Tissue Removed devitalized tissue and dead skin    Wound Therapy - Clinical Statement Order received to complete woundcare to 4th digit.  Mom reports she changed the bandage yesterday due to drainage coming through.  Overall wounds appear to be improving.  Scant drainage for all except for 4th digit. Debrided all wounds with heaviest debridement to lateral MTP region to remove adherent eschar.  Continued with previous dressings and bandaging.    Wound Therapy - Functional Problem List bathing, dressing    Factors Delaying/Impairing Wound Healing Diabetes Mellitus     Hydrotherapy Plan Debridement    Wound Therapy - Frequency 2X / week    Wound Therapy - Current Recommendations PT    Wound Plan debridement and dressing changes.  Weekly measurements and photos on Wednesdays.    Dressing  posterior LE:  xeroform, 2X2,    Dressing lat and medial 5th toe: Toe: xeroform, toe bandage, #1 netting    Dressing 4th toe: silver hydrofiber, toebandaging    Manual Therapy lateral foot:  medihoney hydrogel sheet, 2X2, medipore                     PT Short Term Goals - 04/04/21 1428      PT SHORT TERM GOAL #1   Title wounds will be  100% granulated to demonstrate improved wound healing    Time 2    Period Weeks    Status New    Target Date 04/18/21      PT SHORT TERM GOAL #2   Title Patient will be able to demonstrate independence in donning and doffing compression garments to demonstrate independence in self management of lower extremity swelling.    Time 2    Period Weeks    Status New    Target Date 04/18/21             PT Long Term Goals - 04/04/21 1430      PT LONG TERM GOAL #1   Title Wounds will be completely healed to reduce risk of infection    Time 4    Period Weeks    Status New    Target Date 05/02/21      PT LONG TERM GOAL #2   Title Patient will verbal report ksin care routine to reduce risk of future blisters/wounds    Time 4    Period Weeks    Status New    Target Date 05/02/21                  Patient will benefit from skilled therapeutic intervention in order to improve the following deficits and impairments:     Visit Diagnosis: Diabetic ulcer of right lower leg ([place])  Difficulty in walking, not elsewhere classified     Problem List Patient Active Problem List   Diagnosis Date Noted  . Anxiety 12/21/2020  . Atypical chest pain 12/21/2020  . Body mass index (BMI) 30.0-30.9, adult 12/21/2020  . Chronic pain 12/21/2020  . Disorder of teeth and supporting structures, unspecified 12/21/2020   . Epigastric pain 12/21/2020  . Gastroesophageal reflux disease 12/21/2020  . Headache disorder 12/21/2020  . Insomnia 12/21/2020  . Leukocytosis 12/21/2020  . Oral mucositis (ulcerative), unspecified 12/21/2020  . Panic disorder 12/21/2020  . Poor sleep pattern 12/21/2020  . Sinus tachycardia 12/21/2020  . Systolic murmur 93/81/8299  . Uncontrolled type 1 diabetes mellitus with hyperglycemia ([place]) 11/26/2018  . Personal history of noncompliance with medical treatment, presenting hazards to health 03/15/2017  . Essential hypertension, benign 03/15/2017  . Low back pain 03/14/2017  . Neck pain 03/14/2017  . Uncontrolled diabetes mellitus with diabetic autonomic neuropathy, with long-term current use of insulin ([place]) 03/01/2017  . Type 2 diabetes mellitus with hyperglycemia ([place]) 10/19/2015   Teena Irani, PTA/CLT [phone]  Teena Irani 04/24/2021, 4:56 PM  [facility] [address] [place], [place], [address] Phone: [phone]   Fax:  [phone]  Name: [name] MRN: [id number] Date of Birth: 10/21/1996

## 2021-04-26 ENCOUNTER — Other Ambulatory Visit: Payer: Self-pay

## 2021-04-26 ENCOUNTER — Ambulatory Visit (HOSPITAL_COMMUNITY): Payer: BC Managed Care – PPO | Admitting: Physical Therapy

## 2021-04-26 DIAGNOSIS — E11622 Type 2 diabetes mellitus with other skin ulcer: Secondary | ICD-10-CM | POA: Diagnosis not present

## 2021-04-26 DIAGNOSIS — R262 Difficulty in walking, not elsewhere classified: Secondary | ICD-10-CM

## 2021-04-26 NOTE — Therapy (Signed)
[place], [place], [address] Phone: [phone]   Fax:  [phone]  Wound Care Therapy  Patient Details  Name: [name] MRN: [id number] Date of Birth: 22-Nov-1996 Referring Provider (PT): Coolidge Breeze FNP   Encounter Date: 04/26/2021   PT End of Session - 04/26/21 1511    Visit Number 6    Number of Visits 8    Date for PT Re-Evaluation 05/02/21    Authorization Type BCBS COM PPO - [id number], no auth    Progress Note Due on Visit 10    PT Start Time 1322    PT Stop Time 1410    PT Time Calculation (min) 48 min    Activity Tolerance Patient tolerated treatment well    Behavior During Therapy [facility] for tasks assessed/performed           Past Medical History:  Diagnosis Date  . Diabetes mellitus, type II ([place])   . Heart murmur   . Hypertension     Past Surgical History:  Procedure Laterality Date  . PALATE / UVULA BIOPSY / EXCISION     "growth removed"  . UPPER GASTROINTESTINAL ENDOSCOPY     Done in [place] - 2013 ish    There were no vitals filed for this visit.               Wound Therapy - 04/26/21 1457    Subjective no issues today noted    Patient and Family Stated Goals to have wound healed    Date of Onset --   2.5 months ago   Prior Treatments neosporin, gauze, unna boot    Evaluation and Treatment Procedures Explained to Patient/Family Yes    Evaluation and Treatment Procedures agreed to    Wound Properties Date First Assessed: 04/26/21 Time First Assessed: 0130 Location: Heel Location Orientation: Right Present on Admission: No   Wound Image View All Images View Images    Dressing Type Gauze (Comment)    Dressing Changed New    Dressing Status New drainage    Dressing Change Frequency PRN    Site / Wound Assessment Red;Pink    % Wound base Red or Granulating 95%    % Wound base Yellow/Fibrinous Exudate 5%    Peri-wound Assessment Intact   dry   Wound Length  (cm) 0.8 cm    Wound Width (cm) 0.8 cm    Wound Depth (cm) 0.2 cm    Wound Volume (cm^3) 0.13 cm^3    Wound Surface Area (cm^2) 0.64 cm^2    Margins Unattached edges (unapproximated)    Drainage Amount Minimal    Drainage Description Serous    Treatment Cleansed;Debridement (Selective)    Wound Properties Date First Assessed: 04/19/21 Time First Assessed: 0230 Wound Type: Other (Comment) Location: Toe (Comment  which one) Location Orientation: Right Wound Description (Comments): lateral Rt pinky toe   Wound Image View All Images View Images    Dressing Type Impregnated gauze (bismuth)    Dressing Changed Changed    Dressing Status Intact    Site / Wound Assessment Pink;Red;Yellow    % Wound base Red or Granulating 95%    % Wound base Yellow/Fibrinous Exudate 5%    Peri-wound Assessment Intact    Wound Length (cm) 1.4 cm    Wound Width (cm) 0.8 cm    Wound Depth (cm) 0 cm    Wound Volume (cm^3) 0 cm^3    Wound Surface  Area (cm^2) 1.12 cm^2    Drainage Amount Scant    Drainage Description Serous    Treatment Cleansed;Debridement (Selective)    Wound Properties Date First Assessed: 04/12/21 Time First Assessed: 0230 Wound Type: Other (Comment) Location Orientation: Right Wound Description (Comments): Rt medial pinky toe Present on Admission: No   Wound Image View All Images View Images    Dressing Type Impregnated gauze (bismuth)    Dressing Changed Changed    Dressing Change Frequency PRN    Site / Wound Assessment Pink;Pale    % Wound base Red or Granulating 100%    Peri-wound Assessment Intact    Wound Length (cm) 1 cm    Wound Width (cm) 0.7 cm    Wound Depth (cm) 0 cm    Wound Volume (cm^3) 0 cm^3    Wound Surface Area (cm^2) 0.7 cm^2    Margins Attached edges (approximated)    Drainage Amount None    Treatment Cleansed;Debridement (Selective)    Wound Properties Date First Assessed: 04/19/21 Time First Assessed: 0230 Wound Type: Other (Comment) Location: Toe (Comment   which one) Location Orientation: Right Wound Description (Comments): lateral fourth toe Present on Admission: No   Wound Image View All Images View Images    Dressing Type Silver hydrofiber    Dressing Changed Changed    Dressing Status Intact    Dressing Change Frequency PRN    Site / Wound Assessment Red;Pink;Yellow    % Wound base Red or Granulating 25%    % Wound base Yellow/Fibrinous Exudate 75%    Peri-wound Assessment Maceration    Wound Length (cm) 1 cm    Wound Width (cm) 0.4 cm    Wound Depth (cm) 0.3 cm    Wound Volume (cm^3) 0.12 cm^3    Wound Surface Area (cm^2) 0.4 cm^2    Drainage Amount Minimal    Drainage Description Serosanguineous    Treatment Cleansed;Debridement (Selective)    Wound Properties Date First Assessed: 04/10/21 Time First Assessed: 1325 Wound Type: Other (Comment) Location: Toe (Comment  which one) Location Orientation: Anterior;Right Wound Description (Comments): Rt lateral foot at MTP Present on Admission: No   Wound Image View All Images View Images    Dressing Type Gauze (Comment)   medihoney hydrogel   Dressing Changed Changed    Dressing Status Intact    Dressing Change Frequency PRN    % Wound base Red or Granulating 10%    % Wound base Black/Eschar 90%    Peri-wound Assessment Intact    Wound Length (cm) 3 cm    Wound Width (cm) 1.5 cm    Wound Depth (cm) 0.1 cm    Wound Volume (cm^3) 0.45 cm^3    Wound Surface Area (cm^2) 4.5 cm^2    Margins Unattached edges (unapproximated)    Drainage Amount Minimal    Drainage Description Serous    Treatment Cleansed;Debridement (Selective)    Wound Properties Date First Assessed: 04/04/21 Time First Assessed: 1330 Wound Type: Diabetic ulcer Location: Ankle Location Orientation: Posterior;Right Wound Description (Comments): superior wound Present on Admission: Yes   Wound Image View All Images View Images    Dressing Type Impregnated gauze (petrolatum)    Dressing Changed Changed    Dressing Status  Intact    Dressing Change Frequency PRN    Site / Wound Assessment Red    % Wound base Red or Granulating 100%    Wound Length (cm) 0.5 cm    Wound Width (cm) 0.5 cm  Wound Depth (cm) 0.1 cm    Wound Volume (cm^3) 0.03 cm^3    Wound Surface Area (cm^2) 0.25 cm^2    Margins Attached edges (approximated)    Drainage Amount Scant    Drainage Description Serosanguineous    Treatment Cleansed;Debridement (Selective)    Wound Properties Date First Assessed: 04/04/21 Time First Assessed: 1330 Wound Type: Diabetic ulcer Location: Ankle Location Orientation: Distal;Posterior;Right Wound Description (Comments): inferior wound Present on Admission: Yes   Dressing Type Impregnated gauze (bismuth)    Dressing Changed Changed    Dressing Status Clean    Dressing Change Frequency PRN    Site / Wound Assessment Red    % Wound base Red or Granulating 100%    Peri-wound Assessment Black    Wound Length (cm) 0.3 cm    Wound Width (cm) 0.3 cm    Wound Depth (cm) 0.1 cm    Wound Volume (cm^3) 0.01 cm^3    Wound Surface Area (cm^2) 0.09 cm^2    Margins Attached edges (approximated)    Drainage Amount None    Treatment Cleansed;Debridement (Selective)    Selective Debridement - Location posterior right ankle, 4th, 5th digit and lateral foot    Selective Debridement - Tools Used Scalpel;Forceps;Scissors    Selective Debridement - Tissue Removed devitalized tissue and dead skin    Wound Therapy - Clinical Statement continued to debride around all wounds removing devitalized tissue and attempts at removing adherent eschar from lateral foot.  All wounds measured and photographed today with overall reduction at each site.  More drainaged noted from lateral foot wounds and now with area on plantar surface that also has dranage. unsure if this one is connected in some way, may superficially to lateral wound but appeared to be healed initally.  This wound added to LDA.    Wound Therapy - Functional Problem List  bathing, dressing    Factors Delaying/Impairing Wound Healing Diabetes Mellitus    Hydrotherapy Plan Debridement    Wound Therapy - Frequency 2X / week    Wound Therapy - Current Recommendations PT    Wound Plan debridement and dressing changes.  Weekly measurements and photos on Wednesdays.    Dressing  posterior LE:  xeroform, 2X2,    Dressing lat and medial 5th toe: Toe: xeroform, toe bandage, #1 netting    Dressing 4th toe and plantar wound: silver hydrofiber    Manual Therapy lateral foot:  medihoney hydrogel sheet, 2X2, medipore                     PT Short Term Goals - 04/04/21 1428      PT SHORT TERM GOAL #1   Title wounds will be 100% granulated to demonstrate improved wound healing    Time 2    Period Weeks    Status New    Target Date 04/18/21      PT SHORT TERM GOAL #2   Title Patient will be able to demonstrate independence in donning and doffing compression garments to demonstrate independence in self management of lower extremity swelling.    Time 2    Period Weeks    Status New    Target Date 04/18/21             PT Long Term Goals - 04/04/21 1430      PT LONG TERM GOAL #1   Title Wounds will be completely healed to reduce risk of infection    Time 4    Period  Weeks    Status New    Target Date 05/02/21      PT LONG TERM GOAL #2   Title Patient will verbal report ksin care routine to reduce risk of future blisters/wounds    Time 4    Period Weeks    Status New    Target Date 05/02/21                  Patient will benefit from skilled therapeutic intervention in order to improve the following deficits and impairments:     Visit Diagnosis: Difficulty in walking, not elsewhere classified  Diabetic ulcer of right lower leg [facility])     Problem List Patient Active Problem List   Diagnosis Date Noted  . Anxiety 12/21/2020  . Atypical chest pain 12/21/2020  . Body mass index (BMI) 30.0-30.9, adult 12/21/2020  . Chronic pain  12/21/2020  . Disorder of teeth and supporting structures, unspecified 12/21/2020  . Epigastric pain 12/21/2020  . Gastroesophageal reflux disease 12/21/2020  . Headache disorder 12/21/2020  . Insomnia 12/21/2020  . Leukocytosis 12/21/2020  . Oral mucositis (ulcerative), unspecified 12/21/2020  . Panic disorder 12/21/2020  . Poor sleep pattern 12/21/2020  . Sinus tachycardia 12/21/2020  . Systolic murmur 94/76/5465  . Uncontrolled type 1 diabetes mellitus with hyperglycemia ([place]) 11/26/2018  . Personal history of noncompliance with medical treatment, presenting hazards to health 03/15/2017  . Essential hypertension, benign 03/15/2017  . Low back pain 03/14/2017  . Neck pain 03/14/2017  . Uncontrolled diabetes mellitus with diabetic autonomic neuropathy, with long-term current use of insulin ([place]) 03/01/2017  . Type 2 diabetes mellitus with hyperglycemia ([place]) 10/19/2015   Teena Irani, PTA/CLT [phone]  Teena Irani 04/26/2021, 3:11 PM  [place] [address] [place], [place], [address] Phone: [phone]   Fax:  [phone]  Name: [name] MRN: [id number] Date of Birth: 24-May-1996

## 2021-04-28 ENCOUNTER — Ambulatory Visit (HOSPITAL_COMMUNITY): Payer: BC Managed Care – PPO | Admitting: Physical Therapy

## 2021-05-01 ENCOUNTER — Other Ambulatory Visit: Payer: Self-pay

## 2021-05-01 ENCOUNTER — Ambulatory Visit (HOSPITAL_COMMUNITY): Payer: BC Managed Care – PPO | Admitting: Physical Therapy

## 2021-05-01 DIAGNOSIS — E11622 Type 2 diabetes mellitus with other skin ulcer: Secondary | ICD-10-CM | POA: Diagnosis not present

## 2021-05-01 DIAGNOSIS — R262 Difficulty in walking, not elsewhere classified: Secondary | ICD-10-CM

## 2021-05-01 NOTE — Therapy (Signed)
[place] [address] [place], [place], [address] Phone: [phone]   Fax:  [phone]  Wound Care Therapy Progress Note Reporting Period 04/04/2021  to  05/11/2021  See note below for Objective Data and Assessment of Progress/Goals.      Patient Details  Name: [name] MRN: [id number] Date of Birth: 01/02/1996 Referring Provider (PT): Coolidge Breeze FNP   Encounter Date: 05/01/2021   PT End of Session - 05/01/21 1641    Visit Number 7    Number of Visits 8    Date for PT Re-Evaluation 05/02/21    Authorization Type BCBS COM PPO - VL 20, no auth    Progress Note Due on Visit 17    PT Start Time 1322    PT Stop Time 1415    PT Time Calculation (min) 53 min    Activity Tolerance Patient tolerated treatment well    Behavior During Therapy WFL for tasks assessed/performed           Past Medical History:  Diagnosis Date  . Diabetes mellitus, type II ([place])   . Heart murmur   . Hypertension     Past Surgical History:  Procedure Laterality Date  . PALATE / UVULA BIOPSY / EXCISION     "growth removed"  . UPPER GASTROINTESTINAL ENDOSCOPY     Done in [place] - 2013 ish    There were no vitals filed for this visit.               Wound Therapy - 05/01/21 1618    Subjective pt reports he played some basketball and its draining pretty good today.    Patient and Family Stated Goals to have wound healed    Date of Onset --   2.5 months ago   Prior Treatments neosporin, gauze, unna boot    Evaluation and Treatment Procedures Explained to Patient/Family Yes    Evaluation and Treatment Procedures agreed to    Wound Properties Date First Assessed: 04/26/21 Time First Assessed: 0130 Location: Heel Location Orientation: Right Wound Description (Comments): plantar foot Present on Admission: No   Wound Image View All Images View Images    Dressing Type Gauze (Comment)    Dressing Changed Changed     Dressing Status Old drainage    Dressing Change Frequency PRN    Site / Wound Assessment Red;Pink    % Wound base Red or Granulating 95%    % Wound base Yellow/Fibrinous Exudate 5%    Peri-wound Assessment Intact    Wound Length (cm) 0.5 cm    Wound Width (cm) 0.4 cm    Wound Depth (cm) 0.2 cm    Wound Volume (cm^3) 0.04 cm^3    Wound Surface Area (cm^2) 0.2 cm^2    Drainage Amount Minimal    Drainage Description Serous    Treatment Cleansed;Debridement (Selective)    Wound Properties Date First Assessed: 04/19/21 Time First Assessed: 0230 Wound Type: Other (Comment) Location: Toe (Comment  which one) Location Orientation: Right Wound Description (Comments): lateral Rt pinky toe   Wound Image View All Images View Images    Dressing Type Impregnated gauze (bismuth)    Dressing Changed Changed    Dressing Status Intact    Site / Wound Assessment Pink;Red    % Wound base Red or Granulating 100%    Peri-wound Assessment Intact    Wound Length (cm) 1.2 cm    Wound Width (cm) 0.8 cm  Wound Depth (cm) 0 cm    Wound Volume (cm^3) 0 cm^3    Wound Surface Area (cm^2) 0.96 cm^2    Drainage Amount Scant    Drainage Description Serous    Treatment Cleansed;Debridement (Selective)    Wound Properties Date First Assessed: 04/12/21 Time First Assessed: 0230 Wound Type: Other (Comment) Location Orientation: Right Wound Description (Comments): Rt medial pinky toe Present on Admission: No   Wound Image View All Images View Images    Dressing Type Impregnated gauze (bismuth)    Dressing Changed Changed    Dressing Change Frequency PRN    Site / Wound Assessment Red;Pink    % Wound base Red or Granulating 100%    Peri-wound Assessment Intact    Wound Length (cm) 1 cm    Wound Width (cm) 0.5 cm    Wound Depth (cm) 0 cm    Wound Volume (cm^3) 0 cm^3    Wound Surface Area (cm^2) 0.5 cm^2    Drainage Amount None    Treatment Cleansed    Wound Properties Date First Assessed: 04/19/21 Time First  Assessed: 0230 Wound Type: Other (Comment) Location: Toe (Comment  which one) Location Orientation: Right Wound Description (Comments): lateral fourth toe Present on Admission: No   Wound Image View All Images View Images    Dressing Type Silver hydrofiber    Dressing Changed Changed    Dressing Status Intact    Site / Wound Assessment Red;Yellow;Pink    % Wound base Red or Granulating 50%    % Wound base Yellow/Fibrinous Exudate 50%    Peri-wound Assessment Maceration    Wound Length (cm) 1 cm    Wound Width (cm) 0.7 cm    Wound Depth (cm) 0.3 cm    Wound Volume (cm^3) 0.21 cm^3    Wound Surface Area (cm^2) 0.7 cm^2    Drainage Amount Minimal    Drainage Description Serosanguineous    Treatment Cleansed;Debridement (Selective)    Wound Properties Date First Assessed: 04/10/21 Time First Assessed: 1325 Wound Type: Other (Comment) Location: Toe (Comment  which one) Location Orientation: Anterior;Right Wound Description (Comments): Rt lateral foot at MTP Present on Admission: No   Wound Image View All Images View Images    Dressing Type Gauze (Comment)   medihoney hydrogel   Dressing Changed Changed    Dressing Status Intact    Dressing Change Frequency PRN    % Wound base Red or Granulating 10%    % Wound base Black/Eschar 95%    Peri-wound Assessment Intact    Wound Length (cm) 3 cm    Wound Width (cm) 1.8 cm    Wound Depth (cm) 0.1 cm    Wound Volume (cm^3) 0.54 cm^3    Wound Surface Area (cm^2) 5.4 cm^2    Drainage Amount Minimal    Drainage Description Serous    Treatment Cleansed;Debridement (Selective)    Wound Properties Date First Assessed: 04/04/21 Time First Assessed: 1330 Wound Type: Diabetic ulcer Location: Ankle Location Orientation: Posterior;Right Wound Description (Comments): superior wound Present on Admission: Yes   Wound Image View All Images View Images    Dressing Type Impregnated gauze (bismuth)    Dressing Changed Changed    Dressing Status Intact     Dressing Change Frequency PRN    Site / Wound Assessment Red    % Wound base Red or Granulating 100%    Wound Length (cm) 0.3 cm    Wound Width (cm) 0.3 cm    Wound Depth (  cm) 0 cm    Wound Volume (cm^3) 0 cm^3    Wound Surface Area (cm^2) 0.09 cm^2    Drainage Amount None    Treatment Cleansed;Debridement (Selective)    Selective Debridement - Location posterior right ankle, 4th, 5th digit and lateral foot    Selective Debridement - Tools Used Scalpel;Forceps;Scissors    Selective Debridement - Tissue Removed devitalized tissue and dead skin    Wound Therapy - Clinical Statement Progress note completed this session.  Wounds remeasured and photographed this session following debridement and cleansing.  Wounds continue to improve with superior posterior ankle wound and plantar wound nearly healed and inferior posterior ankle wound now healed.  Lateral foot wound eschar is softening with more layers removed this session.  Lateral and medial pinky is now 100% granulated and 4th toe is also improving in granulation.  Maceration noted however has been up on it more and reports playing some basketball.  Continued with the same dressings as last session.  Pt will benefit from continued skilled woundcare to ensure complete wound healing due to underlying medical limitations.    Wound Therapy - Functional Problem List bathing, dressing    Factors Delaying/Impairing Wound Healing Diabetes Mellitus    Hydrotherapy Plan Debridement    Wound Therapy - Frequency 2X / week    Wound Therapy - Current Recommendations PT    Wound Plan debridement and dressing changes.  Weekly measurements and photos on Wednesdays.    Dressing  posterior LE:  xeroform, 2X2,    Dressing plantar foot, lat and medial 5th toe: Toe: xeroform, toe bandage, #1 netting    Dressing 4th toe: silver hydrofiber    Manual Therapy lateral foot:  medihoney hydrogel sheet, 2X2, medipore                     PT Short Term Goals -  04/04/21 1428      PT SHORT TERM GOAL #1   Title wounds will be 100% granulated to demonstrate improved wound healing    Time 2    Period Weeks    Status New    Target Date 04/18/21      PT SHORT TERM GOAL #2   Title Patient will be able to demonstrate independence in donning and doffing compression garments to demonstrate independence in self management of lower extremity swelling.    Time 2    Period Weeks    Status New    Target Date 04/18/21             PT Long Term Goals - 04/04/21 1430      PT LONG TERM GOAL #1   Title Wounds will be completely healed to reduce risk of infection    Time 4    Period Weeks    Status New    Target Date 05/02/21      PT LONG TERM GOAL #2   Title Patient will verbal report ksin care routine to reduce risk of future blisters/wounds    Time 4    Period Weeks    Status New    Target Date 05/02/21                  Patient will benefit from skilled therapeutic intervention in order to improve the following deficits and impairments:     Visit Diagnosis: Diabetic ulcer of right lower leg ([facility])  Difficulty in walking, not elsewhere classified     Problem List Patient Active Problem List  Diagnosis Date Noted  . Anxiety 12/21/2020  . Atypical chest pain 12/21/2020  . Body mass index (BMI) 30.0-30.9, adult 12/21/2020  . Chronic pain 12/21/2020  . Disorder of teeth and supporting structures, unspecified 12/21/2020  . Epigastric pain 12/21/2020  . Gastroesophageal reflux disease 12/21/2020  . Headache disorder 12/21/2020  . Insomnia 12/21/2020  . Leukocytosis 12/21/2020  . Oral mucositis (ulcerative), unspecified 12/21/2020  . Panic disorder 12/21/2020  . Poor sleep pattern 12/21/2020  . Sinus tachycardia 12/21/2020  . Systolic murmur 18/40/3754  . Uncontrolled type 1 diabetes mellitus with hyperglycemia ([place]) 11/26/2018  . Personal history of noncompliance with medical treatment, presenting hazards to health  03/15/2017  . Essential hypertension, benign 03/15/2017  . Low back pain 03/14/2017  . Neck pain 03/14/2017  . Uncontrolled diabetes mellitus with diabetic autonomic neuropathy, with long-term current use of insulin ([place]) 03/01/2017  . Type 2 diabetes mellitus with hyperglycemia ([place]) 10/19/2015   Teena Irani, PTA/CLT [phone]  Teena Irani 05/01/2021, 4:49 PM  [place] [address] [place], [place], [address] Phone: [phone]   Fax:  [phone]  Name: [name] MRN: [id number] Date of Birth: 1996-03-31

## 2021-05-03 ENCOUNTER — Encounter (HOSPITAL_COMMUNITY): Payer: Self-pay | Admitting: Physical Therapy

## 2021-05-03 ENCOUNTER — Other Ambulatory Visit: Payer: Self-pay

## 2021-05-03 ENCOUNTER — Ambulatory Visit (HOSPITAL_COMMUNITY): Payer: BC Managed Care – PPO | Admitting: Physical Therapy

## 2021-05-03 DIAGNOSIS — E11622 Type 2 diabetes mellitus with other skin ulcer: Secondary | ICD-10-CM | POA: Diagnosis not present

## 2021-05-03 DIAGNOSIS — R262 Difficulty in walking, not elsewhere classified: Secondary | ICD-10-CM

## 2021-05-03 DIAGNOSIS — L97919 Non-pressure chronic ulcer of unspecified part of right lower leg with unspecified severity: Secondary | ICD-10-CM

## 2021-05-03 NOTE — Addendum Note (Signed)
Addended by: Jerene Pitch R on: 05/03/2021 10:45 AM   Modules accepted: Orders

## 2021-05-03 NOTE — Therapy (Signed)
[place], [place], [address] Phone: [phone]   Fax:  [phone]  Wound Care Therapy  Patient Details  Name: [name] MRN: [id number] Date of Birth: 09-Mar-1996 Referring Provider (PT): Coolidge Breeze FNP   Encounter Date: 05/03/2021   PT End of Session - 05/03/21 1641    Visit Number 8    Number of Visits 19    Date for PT Re-Evaluation 06/12/21    Authorization Type BC[employer] COM PPO - VL 20, no auth    Authorization - Visit Number 8    Authorization - Number of Visits 20    Progress Note Due on Visit 17    PT Start Time 6301    PT Stop Time 1448    PT Time Calculation (min) 45 min    Activity Tolerance Patient tolerated treatment well    Behavior During Therapy [facility] for tasks assessed/performed           Past Medical History:  Diagnosis Date  . Diabetes mellitus, type II ([place])   . Heart murmur   . Hypertension     Past Surgical History:  Procedure Laterality Date  . PALATE / UVULA BIOPSY / EXCISION     "growth removed"  . UPPER GASTROINTESTINAL ENDOSCOPY     Done in [place] - 2013 ish    There were no vitals filed for this visit.               Wound Therapy - 05/03/21 0001    Subjective Patient's mom stated his dressing on the side of his foot was falling off so she put vasoline around it with gauze.    Patient and Family Stated Goals to have wound healed    Date of Onset --   2.5 months ago   Prior Treatments neosporin, gauze, unna boot    Pain Score 0-No pain    Evaluation and Treatment Procedures Explained to Patient/Family Yes    Evaluation and Treatment Procedures agreed to    Wound Properties Date First Assessed: 04/26/21 Time First Assessed: 0130 Location: Heel Location Orientation: Right Wound Description (Comments): plantar foot Present on Admission: No   Dressing Type Gauze (Comment)    Dressing Changed Changed    Dressing Status Old drainage    Dressing  Change Frequency PRN    Site / Wound Assessment Red;Pink    % Wound base Red or Granulating 95%    % Wound base Yellow/Fibrinous Exudate 5%    Peri-wound Assessment Intact    Margins Unattached edges (unapproximated)    Drainage Amount Minimal    Drainage Description Serous    Treatment Cleansed;Debridement (Selective)    Wound Properties Date First Assessed: 04/19/21 Time First Assessed: 0230 Wound Type: Other (Comment) Location: Toe (Comment  which one) Location Orientation: Right Wound Description (Comments): lateral Rt pinky toe   Dressing Type Impregnated gauze (bismuth)    Dressing Changed Changed    Dressing Status Intact    Site / Wound Assessment Pink;Red    % Wound base Red or Granulating 100%    Drainage Amount Scant    Drainage Description Serous    Treatment Cleansed;Debridement (Selective)    Wound Properties Date First Assessed: 04/12/21 Time First Assessed: 0230 Wound Type: Other (Comment) Location Orientation: Right Wound Description (Comments): Rt medial pinky toe Present on Admission: No   Dressing Type Impregnated gauze (bismuth)    Dressing Changed Changed    Dressing Change Frequency  PRN    Site / Wound Assessment Red;Pink    % Wound base Red or Granulating 100%    Peri-wound Assessment Maceration    Margins Attached edges (approximated)    Drainage Amount None    Treatment Cleansed    Wound Properties Date First Assessed: 04/19/21 Time First Assessed: 0230 Wound Type: Other (Comment) Location: Toe (Comment  which one) Location Orientation: Right Wound Description (Comments): lateral fourth toe Present on Admission: No   Dressing Type Silver hydrofiber    Dressing Changed Changed    Dressing Status Intact    Site / Wound Assessment Pink;Red;Yellow    % Wound base Red or Granulating 75%    % Wound base Yellow/Fibrinous Exudate 25%    Peri-wound Assessment Maceration    Drainage Amount Minimal    Drainage Description Sanguineous    Treatment  Cleansed;Debridement (Selective)    Wound Properties Date First Assessed: 04/10/21 Time First Assessed: 1325 Wound Type: Other (Comment) Location: Toe (Comment  which one) Location Orientation: Anterior;Right Wound Description (Comments): Rt lateral foot at MTP Present on Admission: No   Dressing Type Gauze (Comment)   medihoney hydrogel   Dressing Changed Changed    Dressing Status Intact    Dressing Change Frequency PRN    % Wound base Red or Granulating 5%    % Wound base Yellow/Fibrinous Exudate 95%    Peri-wound Assessment Intact    Drainage Amount Minimal    Drainage Description Serous    Treatment Cleansed;Debridement (Selective)    Wound Properties Date First Assessed: 04/04/21 Time First Assessed: 1330 Wound Type: Diabetic ulcer Location: Ankle Location Orientation: Posterior;Right Wound Description (Comments): superior wound Present on Admission: Yes   Dressing Type Impregnated gauze (bismuth)    Dressing Changed Changed    Dressing Status Intact    Dressing Change Frequency PRN    Site / Wound Assessment Red    % Wound base Red or Granulating 100%    Wound Properties Date First Assessed: 04/04/21 Time First Assessed: 1330 Wound Type: Diabetic ulcer Location: Ankle Location Orientation: Distal;Posterior;Right Wound Description (Comments): inferior wound Present on Admission: Yes   Dressing Changed Changed    Dressing Status Clean    Dressing Change Frequency PRN    Site / Wound Assessment Red    % Wound base Red or Granulating 100%    Drainage Amount None    Treatment Cleansed;Debridement (Selective)    Selective Debridement - Location posterior right ankle, 4th, 5th digit and lateral foot    Selective Debridement - Tools Used Scalpel;Forceps;Scissors    Selective Debridement - Tissue Removed devitalized tissue and dead skin    Wound Therapy - Clinical Statement Patient's wounds appear to be healing very well. Posterior ankle wounds are very minimal at this point. Patient with  continued adherent slough at lateral foot wound. Plantar foot wound appears very small compared to last session with all granulation tissue present. Patient with continued maceration between 4th and 5th digits due to continued drainage. Patient tolerates sharp debridement very well secondary to impaired sensation. Patient will continue to benefit from skilled physical therapy to promote wound healing.    Wound Therapy - Functional Problem List bathing, dressing    Factors Delaying/Impairing Wound Healing Diabetes Mellitus    Hydrotherapy Plan Debridement    Wound Therapy - Frequency 2X / week    Wound Therapy - Current Recommendations PT    Wound Plan debridement and dressing changes.  Weekly measurements and photos on Wednesdays.  Dressing  posterior LE:  xeroform, 2X2,    Dressing plantar foot, lat and medial 5th toe: Toe: xeroform, toe bandage, #1 netting    Dressing silver hydrofiber to 4th toe    Manual Therapy lateral foot:  medihoney hydrogel sheet, 2X2, medipore                     PT Short Term Goals - 04/04/21 1428      PT SHORT TERM GOAL #1   Title wounds will be 100% granulated to demonstrate improved wound healing    Time 2    Period Weeks    Status New    Target Date 04/18/21      PT SHORT TERM GOAL #2   Title Patient will be able to demonstrate independence in donning and doffing compression garments to demonstrate independence in self management of lower extremity swelling.    Time 2    Period Weeks    Status New    Target Date 04/18/21             PT Long Term Goals - 04/04/21 1430      PT LONG TERM GOAL #1   Title Wounds will be completely healed to reduce risk of infection    Time 4    Period Weeks    Status New    Target Date 05/02/21      PT LONG TERM GOAL #2   Title Patient will verbal report ksin care routine to reduce risk of future blisters/wounds    Time 4    Period Weeks    Status New    Target Date 05/02/21                  Plan - 05/03/21 1645    Clinical Impression Statement see above    Personal Factors and Comorbidities Comorbidity 1;Comorbidity 2    Comorbidities diabetes, PVD, peripheral neuropathy    Examination-Activity Limitations Locomotion Level;Bathing    Stability/Clinical Decision Making Stable/Uncomplicated    Rehab Potential Excellent    PT Frequency 2x / week    PT Duration 6 weeks    PT Treatment/Interventions ADLs/Self Care Home Management;Other (comment);Compression bandaging;Patient/family education   wound care, modalities as needed   PT Next Visit Plan continue with dressing changes, measure for compression garments, ankle pumps for HEP    PT Home Exercise Plan none at this time    Consulted and Agree with Plan of Care Patient;Family member/caregiver    Family Member Consulted Mother - Butch Penny           Patient will benefit from skilled therapeutic intervention in order to improve the following deficits and impairments:  Decreased skin integrity,Impaired sensation,Other (comment) (wound care)  Visit Diagnosis: Diabetic ulcer of right lower leg ([place])  Difficulty in walking, not elsewhere classified     Problem List Patient Active Problem List   Diagnosis Date Noted  . Anxiety 12/21/2020  . Atypical chest pain 12/21/2020  . Body mass index (BMI) 30.0-30.9, adult 12/21/2020  . Chronic pain 12/21/2020  . Disorder of teeth and supporting structures, unspecified 12/21/2020  . Epigastric pain 12/21/2020  . Gastroesophageal reflux disease 12/21/2020  . Headache disorder 12/21/2020  . Insomnia 12/21/2020  . Leukocytosis 12/21/2020  . Oral mucositis (ulcerative), unspecified 12/21/2020  . Panic disorder 12/21/2020  . Poor sleep pattern 12/21/2020  . Sinus tachycardia 12/21/2020  . Systolic murmur 95/63/8756  . Uncontrolled type 1 diabetes mellitus with hyperglycemia ([place]) 11/26/2018  .  Personal history of noncompliance with medical treatment, presenting hazards  to health 03/15/2017  . Essential hypertension, benign 03/15/2017  . Low back pain 03/14/2017  . Neck pain 03/14/2017  . Uncontrolled diabetes mellitus with diabetic autonomic neuropathy, with long-term current use of insulin ([place]) 03/01/2017  . Type 2 diabetes mellitus with hyperglycemia ([place]) 10/19/2015   5:06 PM, 05/03/21 Mearl Latin PT, DPT Physical Therapist at [facility], [place], [address] Phone: [phone]   Fax:  [phone]  Name: BROWARD KUMPE MRN: [id number] Date of Birth: 06-04-1996

## 2021-05-05 ENCOUNTER — Ambulatory Visit (HOSPITAL_COMMUNITY): Payer: BC Managed Care – PPO

## 2021-05-08 ENCOUNTER — Ambulatory Visit (HOSPITAL_COMMUNITY): Payer: BC Managed Care – PPO | Admitting: Physical Therapy

## 2021-05-08 ENCOUNTER — Other Ambulatory Visit: Payer: Self-pay

## 2021-05-08 DIAGNOSIS — R262 Difficulty in walking, not elsewhere classified: Secondary | ICD-10-CM

## 2021-05-08 DIAGNOSIS — E11622 Type 2 diabetes mellitus with other skin ulcer: Secondary | ICD-10-CM | POA: Diagnosis not present

## 2021-05-08 NOTE — Therapy (Signed)
[place], [place], [address] Phone: [phone]   Fax:  [phone]  Wound Care Therapy  Patient Details  Name: [name] MRN: [id number] Date of Birth: 05/27/1996 Referring Provider (PT): Coolidge Breeze FNP   Encounter Date: 05/08/2021   PT End of Session - 05/08/21 1703    Visit Number 9    Number of Visits 19    Date for PT Re-Evaluation 06/12/21    Authorization Type BCBS COM PPO - VL 20, no auth    Authorization - Visit Number 9    Authorization - Number of Visits 20    Progress Note Due on Visit 17    PT Start Time 1315    PT Stop Time 1400    PT Time Calculation (min) 45 min    Activity Tolerance Patient tolerated treatment well    Behavior During Therapy [facility] for tasks assessed/performed           Past Medical History:  Diagnosis Date  . Diabetes mellitus, type II ([place])   . Heart murmur   . Hypertension     Past Surgical History:  Procedure Laterality Date  . PALATE / UVULA BIOPSY / EXCISION     "growth removed"  . UPPER GASTROINTESTINAL ENDOSCOPY     Done in [place] - 2013 ish    There were no vitals filed for this visit.               Wound Therapy - 05/08/21 0001    Subjective Pt states that his mom had to redress his bandage due to increased drainage.    Patient and Family Stated Goals to have wound healed    Date of Onset --   2.5 months ago   Prior Treatments neosporin, gauze, unna boot    Evaluation and Treatment Procedures Explained to Patient/Family Yes    Evaluation and Treatment Procedures agreed to    Wound Properties Date First Assessed: 04/26/21 Time First Assessed: 0130 Location: Heel Location Orientation: Right Wound Description (Comments): plantar foot Present on Admission: No   Dressing Type --    Dressing Changed --    Dressing Status --    Dressing Change Frequency --    Site / Wound Assessment --    % Wound base Red or Granulating --     % Wound base Yellow/Fibrinous Exudate --    Peri-wound Assessment --    Margins --    Drainage Amount --    Drainage Description --    Treatment --    Wound Properties Date First Assessed: 04/19/21 Time First Assessed: 0230 Wound Type: Other (Comment) Location: Toe (Comment  which one) Location Orientation: Right Wound Description (Comments): lateral Rt pinky toe   Dressing Type --    Dressing Changed --    Dressing Status --    Site / Wound Assessment --    % Wound base Red or Granulating --    % Wound base Yellow/Fibrinous Exudate --    Drainage Amount --    Drainage Description --    Treatment --    Wound Properties Date First Assessed: 04/04/21 Time First Assessed: 1330 Wound Type: Diabetic ulcer Location: Ankle Location Orientation: Posterior;Right Wound Description (Comments): superior wound Present on Admission: Yes Final Assessment Date: 05/08/21 Final Assessment Time: 1315   Wound Properties Date First Assessed: 04/12/21 Time First Assessed: 0230 Wound Type: Other (Comment) Location Orientation: Right Wound Description (Comments): Rt medial pinky  toe Present on Admission: No   Dressing Type Impregnated gauze (petrolatum)    Dressing Changed Changed    Dressing Change Frequency PRN    Site / Wound Assessment Red    % Wound base Red or Granulating 100%    Margins Attached edges (approximated)    Drainage Amount None    Treatment Cleansed    Wound Properties Date First Assessed: 04/19/21 Time First Assessed: 0230 Wound Type: Other (Comment) Location: Toe (Comment  which one) Location Orientation: Right Wound Description (Comments): lateral fourth toe Present on Admission: No   Dressing Type Silver hydrofiber    Dressing Changed Changed    Dressing Status Intact    Site / Wound Assessment Red    % Wound base Red or Granulating 85%    % Wound base Yellow/Fibrinous Exudate 15%    Drainage Amount Minimal    Drainage Description Sanguineous    Treatment Cleansed    Wound  Properties Date First Assessed: 04/10/21 Time First Assessed: 1325 Wound Type: Other (Comment) Location: Toe (Comment  which one) Location Orientation: Anterior;Right Wound Description (Comments): Rt lateral foot at MTP Present on Admission: No   Dressing Type Gauze (Comment)    Dressing Changed Changed    Dressing Status Intact    Dressing Change Frequency PRN    % Wound base Red or Granulating 10%    % Wound base Yellow/Fibrinous Exudate 90%    Peri-wound Assessment Intact    Drainage Amount Moderate    Drainage Description Serous    Treatment Cleansed;Debridement (Selective)    Selective Debridement - Location Lateral aspect of Rt 5th MTP head; plantar aspect of %th MTP therapist believes these two wounds connectl    Selective Debridement - Tools Used Scalpel;Forceps;Scissors    Selective Debridement - Tissue Removed devitalized tissue and dead skin    Wound Therapy - Clinical Statement Pt posterior wounds have healed.  PT continues to have redness around little toe as well as drainage.  Therapist sent request for both a culture and a xray to ensure that there is not bone involvement.  Pt had an ABI in Jan which was normal.    Wound Therapy - Functional Problem List bathing, dressing    Factors Delaying/Impairing Wound Healing Diabetes Mellitus    Hydrotherapy Plan Debridement    Wound Therapy - Frequency 2X / week    Wound Therapy - Current Recommendations PT    Wound Plan take pictures and measure wounds.  See if request for culture has come in .  Continue with debridement and dressing changes.  Weekly measurements and photos on Wednesdays.    Dressing  silver hydrofiber to lateral wound, xeroform to plantar aspect of 5th MTP and on lateral aspect of 4th toe f/b 2x2, 2" kling followed by #5 netting.    Dressing --    Manual Therapy --                     PT Short Term Goals - 04/04/21 1428      PT SHORT TERM GOAL #1   Title wounds will be 100% granulated to demonstrate  improved wound healing    Time 2    Period Weeks    Status New    Target Date 04/18/21      PT SHORT TERM GOAL #2   Title Patient will be able to demonstrate independence in donning and doffing compression garments to demonstrate independence in self management of lower extremity swelling.  Time 2    Period Weeks    Status New    Target Date 04/18/21             PT Long Term Goals - 04/04/21 1430      PT LONG TERM GOAL #1   Title Wounds will be completely healed to reduce risk of infection    Time 4    Period Weeks    Status New    Target Date 05/02/21      PT LONG TERM GOAL #2   Title Patient will verbal report ksin care routine to reduce risk of future blisters/wounds    Time 4    Period Weeks    Status New    Target Date 05/02/21                 Plan - 05/08/21 1703    Clinical Impression Statement see above    Personal Factors and Comorbidities Comorbidity 1;Comorbidity 2    Comorbidities diabetes, PVD, peripheral neuropathy    Examination-Activity Limitations Locomotion Level;Bathing    Stability/Clinical Decision Making Stable/Uncomplicated    Rehab Potential Excellent    PT Frequency 2x / week    PT Duration 6 weeks    PT Treatment/Interventions ADLs/Self Care Home Management;Other (comment);Compression bandaging;Patient/family education   wound care, modalities as needed   PT Next Visit Plan continue with dressing changes, measure for compression garments, ankle pumps for HEP    PT Home Exercise Plan none at this time    Consulted and Agree with Plan of Care Patient;Family member/caregiver    Family Member Consulted Mother - Butch Penny           Patient will benefit from skilled therapeutic intervention in order to improve the following deficits and impairments:  Decreased skin integrity,Impaired sensation,Other (comment) (wound care)  Visit Diagnosis: Diabetic ulcer of right lower leg ([place])  Difficulty in walking, not elsewhere  classified     Problem List Patient Active Problem List   Diagnosis Date Noted  . Anxiety 12/21/2020  . Atypical chest pain 12/21/2020  . Body mass index (BMI) 30.0-30.9, adult 12/21/2020  . Chronic pain 12/21/2020  . Disorder of teeth and supporting structures, unspecified 12/21/2020  . Epigastric pain 12/21/2020  . Gastroesophageal reflux disease 12/21/2020  . Headache disorder 12/21/2020  . Insomnia 12/21/2020  . Leukocytosis 12/21/2020  . Oral mucositis (ulcerative), unspecified 12/21/2020  . Panic disorder 12/21/2020  . Poor sleep pattern 12/21/2020  . Sinus tachycardia 12/21/2020  . Systolic murmur 25/04/3975  . Uncontrolled type 1 diabetes mellitus with hyperglycemia ([place]) 11/26/2018  . Personal history of noncompliance with medical treatment, presenting hazards to health 03/15/2017  . Essential hypertension, benign 03/15/2017  . Low back pain 03/14/2017  . Neck pain 03/14/2017  . Uncontrolled diabetes mellitus with diabetic autonomic neuropathy, with long-term current use of insulin ([place]) 03/01/2017  . Type 2 diabetes mellitus with hyperglycemia [facility]) 10/19/2015    Rayetta Humphrey, PT CLT [phone] 05/08/2021, 5:04 PM  [place] [address] [place], [place], [address] Phone: [phone]   Fax:  [phone]  Name: DERIN GRANQUIST MRN: [id number] Date of Birth: 07/25/1996

## 2021-05-10 ENCOUNTER — Ambulatory Visit (HOSPITAL_COMMUNITY): Payer: BC Managed Care – PPO

## 2021-05-10 ENCOUNTER — Other Ambulatory Visit: Payer: Self-pay | Admitting: Orthopedic Surgery

## 2021-05-10 DIAGNOSIS — M25361 Other instability, right knee: Secondary | ICD-10-CM

## 2021-05-10 DIAGNOSIS — M25561 Pain in right knee: Secondary | ICD-10-CM

## 2021-05-12 ENCOUNTER — Ambulatory Visit (HOSPITAL_COMMUNITY): Payer: BC Managed Care – PPO

## 2021-05-16 ENCOUNTER — Other Ambulatory Visit: Payer: Self-pay

## 2021-05-16 ENCOUNTER — Ambulatory Visit (HOSPITAL_COMMUNITY): Payer: BC Managed Care – PPO | Admitting: Physical Therapy

## 2021-05-16 DIAGNOSIS — E11622 Type 2 diabetes mellitus with other skin ulcer: Secondary | ICD-10-CM

## 2021-05-16 DIAGNOSIS — L97919 Non-pressure chronic ulcer of unspecified part of right lower leg with unspecified severity: Secondary | ICD-10-CM

## 2021-05-16 DIAGNOSIS — R262 Difficulty in walking, not elsewhere classified: Secondary | ICD-10-CM

## 2021-05-16 NOTE — Therapy (Signed)
[place], [place], [address] Phone: [phone]   Fax:  [phone]  Wound Care Therapy  Patient Details  Name: [name] MRN: [id number] Date of Birth: 03-08-1996 Referring Provider (PT): Coolidge Breeze FNP   Encounter Date: 05/16/2021    Past Medical History:  Diagnosis Date  . Diabetes mellitus, type II ([place])   . Heart murmur   . Hypertension     Past Surgical History:  Procedure Laterality Date  . PALATE / UVULA BIOPSY / EXCISION     "growth removed"  . UPPER GASTROINTESTINAL ENDOSCOPY     Done in [place] - 2013 ish    There were no vitals filed for this visit.               Wound Therapy - 05/16/21 1103    Subjective pt comes today wearing boots.  States his mom changed it yesterday due to drainage.    Patient and Family Stated Goals to have wound healed    Date of Onset --   2.5 months ago   Prior Treatments neosporin, gauze, unna boot    Evaluation and Treatment Procedures Explained to Patient/Family Yes    Evaluation and Treatment Procedures agreed to    Wound Properties Date First Assessed: 04/19/21 Time First Assessed: 0230 Wound Type: Other (Comment) Location: Toe (Comment  which one) Location Orientation: Right Wound Description (Comments): lateral fourth toe Present on Admission: No   Wound Image View All Images View Images    Dressing Type Silver hydrofiber    Dressing Changed Changed    Dressing Status Intact    Site / Wound Assessment Red;Pink    % Wound base Red or Granulating 95%    % Wound base Yellow/Fibrinous Exudate 5%    Peri-wound Assessment Maceration    Wound Length (cm) 0.7 cm    Wound Width (cm) 0.4 cm    Wound Depth (cm) 0.3 cm    Wound Volume (cm^3) 0.08 cm^3    Wound Surface Area (cm^2) 0.28 cm^2    Drainage Amount Minimal    Drainage Description Serous    Treatment Cleansed;Debridement (Selective)    Wound Properties Date First Assessed:  04/10/21 Time First Assessed: 1325 Wound Type: Other (Comment) Location: Toe (Comment  which one) Location Orientation: Anterior;Right Wound Description (Comments): Rt lateral foot at MTP Present on Admission: No   Wound Image View All Images View Images    Dressing Type Silver hydrofiber    Dressing Changed Changed    Dressing Status Intact    Dressing Change Frequency PRN    % Wound base Red or Granulating 40%    % Wound base Yellow/Fibrinous Exudate 60%    Peri-wound Assessment Intact;Maceration;Erythema (blanchable)    Wound Length (cm) 2.8 cm    Wound Width (cm) 2 cm    Wound Depth (cm) 0.4 cm    Wound Volume (cm^3) 2.24 cm^3    Wound Surface Area (cm^2) 5.6 cm^2    Drainage Amount Moderate    Drainage Description Serosanguineous    Treatment Cleansed;Debridement (Selective)    Selective Debridement - Location Lateral aspect of Rt 4th toe and lateral foot    Selective Debridement - Tools Used Scalpel;Forceps;Scissors    Selective Debridement - Tissue Removed devitalized tissue and dead skin    Wound Therapy - Clinical Statement Wounds measured and photographed this session.  All wounds healed except for lateral 4th toe and lateral foot.  Lateral 4th  toe has reduced in size and is healing nicely.  Lateral foot is actually slightly larger than last week with increased redness and depth of wound.  Superiorior portion more granulated but with unknow depth inferior aspect at this point.  STill with large amount of slough and necrotic tissue in this area.  continued with silver hydro to both areas due to drainage and macerated edges.    Wound Therapy - Functional Problem List bathing, dressing    Factors Delaying/Impairing Wound Healing Diabetes Mellitus    Hydrotherapy Plan Debridement    Wound Therapy - Frequency 2X / week    Wound Therapy - Current Recommendations PT    Wound Plan Continue with debridement and dressing changes.  Weekly measurements and photos on Wednesdays.    Dressing   Lateral Foot: silver hydrofiber packed into wound, gauze, kerlix and #3 netting    Dressing Lateral 4th toe:  silver hydrofiber, gauze, medipore tape cut in small strip                     PT Short Term Goals - 04/04/21 1428      PT SHORT TERM GOAL #1   Title wounds will be 100% granulated to demonstrate improved wound healing    Time 2    Period Weeks    Status New    Target Date 04/18/21      PT SHORT TERM GOAL #2   Title Patient will be able to demonstrate independence in donning and doffing compression garments to demonstrate independence in self management of lower extremity swelling.    Time 2    Period Weeks    Status New    Target Date 04/18/21             PT Long Term Goals - 04/04/21 1430      PT LONG TERM GOAL #1   Title Wounds will be completely healed to reduce risk of infection    Time 4    Period Weeks    Status New    Target Date 05/02/21      PT LONG TERM GOAL #2   Title Patient will verbal report ksin care routine to reduce risk of future blisters/wounds    Time 4    Period Weeks    Status New    Target Date 05/02/21                  Patient will benefit from skilled therapeutic intervention in order to improve the following deficits and impairments:     Visit Diagnosis: No diagnosis found.     Problem List Patient Active Problem List   Diagnosis Date Noted  . Anxiety 12/21/2020  . Atypical chest pain 12/21/2020  . Body mass index (BMI) 30.0-30.9, adult 12/21/2020  . Chronic pain 12/21/2020  . Disorder of teeth and supporting structures, unspecified 12/21/2020  . Epigastric pain 12/21/2020  . Gastroesophageal reflux disease 12/21/2020  . Headache disorder 12/21/2020  . Insomnia 12/21/2020  . Leukocytosis 12/21/2020  . Oral mucositis (ulcerative), unspecified 12/21/2020  . Panic disorder 12/21/2020  . Poor sleep pattern 12/21/2020  . Sinus tachycardia 12/21/2020  . Systolic murmur 24/58/0998  . Uncontrolled  type 1 diabetes mellitus with hyperglycemia ([place]) 11/26/2018  . Personal history of noncompliance with medical treatment, presenting hazards to health 03/15/2017  . Essential hypertension, benign 03/15/2017  . Low back pain 03/14/2017  . Neck pain 03/14/2017  . Uncontrolled diabetes mellitus with diabetic autonomic neuropathy, with long-term  current use of insulin ([place]) 03/01/2017  . Type 2 diabetes mellitus with hyperglycemia ([place]) 10/19/2015   Teena Irani, PTA/CLT [phone]  Teena Irani 05/16/2021, 11:14 AM  [facility] [address] [place], [place], [address] Phone: [phone]   Fax:  [phone]  Name: [name] MRN: [id number] Date of Birth: December 11, 1996

## 2021-05-17 ENCOUNTER — Encounter (HOSPITAL_COMMUNITY): Payer: Self-pay | Admitting: Physical Therapy

## 2021-05-17 NOTE — Telephone Encounter (Signed)
Per patient email:  [name], I had a appointment with Amy yesterday. Was going to message her but she's not on the list of people I can message. But the outside of my foot has been giving me some problems since yesterday even being a pain to walk on, around the little toe on outside already looks as though it's leaked alot and little toe seems pretty red and swollen. PT called patient back and instructed him to go to ED for consultation.  Patient agreed to do so.   Vangie Bicker, [place], OTR/L [phone]

## 2021-05-18 ENCOUNTER — Telehealth (HOSPITAL_COMMUNITY): Payer: Self-pay

## 2021-05-18 ENCOUNTER — Ambulatory Visit (HOSPITAL_COMMUNITY): Payer: BC Managed Care – PPO

## 2021-05-18 NOTE — Telephone Encounter (Signed)
No show, called and spoke to pt who stated his vehicle is broken down and unable to make it.  When asked if he went to ER, stated his sister had to take one of her kids to [place] and lost time.  Stated foot is feeling better today.  Reminded next apt date and time and requested pt to call if unable to make it to apt.  Ihor Austin, LPTA/CLT; Delana Meyer [phone]

## 2021-05-20 ENCOUNTER — Encounter (HOSPITAL_COMMUNITY): Payer: Self-pay

## 2021-05-20 ENCOUNTER — Emergency Department (HOSPITAL_COMMUNITY): Payer: BC Managed Care – PPO

## 2021-05-20 ENCOUNTER — Other Ambulatory Visit: Payer: Self-pay

## 2021-05-20 ENCOUNTER — Inpatient Hospital Stay (HOSPITAL_COMMUNITY)
Admission: EM | Admit: 2021-05-20 | Discharge: 2021-05-26 | DRG: 617 | Disposition: A | Discharge status: Home Health | Payer: BC Managed Care – PPO | Attending: Internal Medicine | Admitting: Internal Medicine

## 2021-05-20 DIAGNOSIS — R011 Cardiac murmur, unspecified: Secondary | ICD-10-CM | POA: Diagnosis present

## 2021-05-20 DIAGNOSIS — M869 Osteomyelitis, unspecified: Secondary | ICD-10-CM

## 2021-05-20 DIAGNOSIS — Z8249 Family history of ischemic heart disease and other diseases of the circulatory system: Secondary | ICD-10-CM

## 2021-05-20 DIAGNOSIS — E10621 Type 1 diabetes mellitus with foot ulcer: Secondary | ICD-10-CM | POA: Diagnosis present

## 2021-05-20 DIAGNOSIS — Z880 Allergy status to penicillin: Secondary | ICD-10-CM | POA: Diagnosis not present

## 2021-05-20 DIAGNOSIS — E1069 Type 1 diabetes mellitus with other specified complication: Principal | ICD-10-CM | POA: Diagnosis present

## 2021-05-20 DIAGNOSIS — E10628 Type 1 diabetes mellitus with other skin complications: Secondary | ICD-10-CM | POA: Diagnosis present

## 2021-05-20 DIAGNOSIS — E1040 Type 1 diabetes mellitus with diabetic neuropathy, unspecified: Secondary | ICD-10-CM | POA: Diagnosis present

## 2021-05-20 DIAGNOSIS — Z833 Family history of diabetes mellitus: Secondary | ICD-10-CM

## 2021-05-20 DIAGNOSIS — E1051 Type 1 diabetes mellitus with diabetic peripheral angiopathy without gangrene: Secondary | ICD-10-CM | POA: Diagnosis present

## 2021-05-20 DIAGNOSIS — Z20822 Contact with and (suspected) exposure to covid-19: Secondary | ICD-10-CM | POA: Diagnosis present

## 2021-05-20 DIAGNOSIS — R000 Tachycardia, unspecified: Secondary | ICD-10-CM | POA: Diagnosis present

## 2021-05-20 DIAGNOSIS — E782 Mixed hyperlipidemia: Secondary | ICD-10-CM | POA: Diagnosis present

## 2021-05-20 DIAGNOSIS — Z7722 Contact with and (suspected) exposure to environmental tobacco smoke (acute) (chronic): Secondary | ICD-10-CM | POA: Diagnosis present

## 2021-05-20 DIAGNOSIS — Z481 Encounter for planned postprocedural wound closure: Secondary | ICD-10-CM

## 2021-05-20 DIAGNOSIS — K2100 Gastro-esophageal reflux disease with esophagitis, without bleeding: Secondary | ICD-10-CM | POA: Diagnosis not present

## 2021-05-20 DIAGNOSIS — I10 Essential (primary) hypertension: Secondary | ICD-10-CM | POA: Diagnosis present

## 2021-05-20 DIAGNOSIS — M009 Pyogenic arthritis, unspecified: Secondary | ICD-10-CM | POA: Diagnosis present

## 2021-05-20 DIAGNOSIS — E785 Hyperlipidemia, unspecified: Secondary | ICD-10-CM | POA: Diagnosis present

## 2021-05-20 DIAGNOSIS — M86171 Other acute osteomyelitis, right ankle and foot: Secondary | ICD-10-CM | POA: Diagnosis present

## 2021-05-20 DIAGNOSIS — E1065 Type 1 diabetes mellitus with hyperglycemia: Secondary | ICD-10-CM | POA: Diagnosis present

## 2021-05-20 DIAGNOSIS — K219 Gastro-esophageal reflux disease without esophagitis: Secondary | ICD-10-CM | POA: Diagnosis present

## 2021-05-20 DIAGNOSIS — E440 Moderate protein-calorie malnutrition: Secondary | ICD-10-CM | POA: Diagnosis present

## 2021-05-20 DIAGNOSIS — F410 Panic disorder [episodic paroxysmal anxiety] without agoraphobia: Secondary | ICD-10-CM | POA: Diagnosis present

## 2021-05-20 DIAGNOSIS — L02611 Cutaneous abscess of right foot: Secondary | ICD-10-CM | POA: Diagnosis present

## 2021-05-20 DIAGNOSIS — B9562 Methicillin resistant Staphylococcus aureus infection as the cause of diseases classified elsewhere: Secondary | ICD-10-CM | POA: Diagnosis present

## 2021-05-20 DIAGNOSIS — F419 Anxiety disorder, unspecified: Secondary | ICD-10-CM | POA: Diagnosis present

## 2021-05-20 DIAGNOSIS — E7800 Pure hypercholesterolemia, unspecified: Secondary | ICD-10-CM | POA: Diagnosis not present

## 2021-05-20 DIAGNOSIS — L97519 Non-pressure chronic ulcer of other part of right foot with unspecified severity: Secondary | ICD-10-CM | POA: Diagnosis present

## 2021-05-20 DIAGNOSIS — Z794 Long term (current) use of insulin: Secondary | ICD-10-CM

## 2021-05-20 DIAGNOSIS — Z9889 Other specified postprocedural states: Secondary | ICD-10-CM

## 2021-05-20 DIAGNOSIS — Z79899 Other long term (current) drug therapy: Secondary | ICD-10-CM

## 2021-05-20 HISTORY — DX: Other complications of anesthesia, initial encounter: T88.59XA

## 2021-05-20 LAB — CBC WITH DIFFERENTIAL/PLATELET
Abs Immature Granulocytes: 0.08 10*3/uL — ABNORMAL HIGH (ref 0.00–0.07)
Basophils Absolute: 0.1 10*3/uL (ref 0.0–0.1)
Basophils Relative: 0 %
Eosinophils Absolute: 0.2 10*3/uL (ref 0.0–0.5)
Eosinophils Relative: 1 %
HCT: 42 % (ref 39.0–52.0)
Hemoglobin: 14 g/dL (ref 13.0–17.0)
Immature Granulocytes: 0 %
Lymphocytes Relative: 8 %
Lymphs Abs: 1.5 10*3/uL (ref 0.7–4.0)
MCH: 27.9 pg (ref 26.0–34.0)
MCHC: 33.3 g/dL (ref 30.0–36.0)
MCV: 83.7 fL (ref 80.0–100.0)
Monocytes Absolute: 1.9 10*3/uL — ABNORMAL HIGH (ref 0.1–1.0)
Monocytes Relative: 10 %
Neutro Abs: 14.7 10*3/uL — ABNORMAL HIGH (ref 1.7–7.7)
Neutrophils Relative %: 81 %
Platelets: 303 10*3/uL (ref 150–400)
RBC: 5.02 MIL/uL (ref 4.22–5.81)
RDW: 12 % (ref 11.5–15.5)
WBC: 18.5 10*3/uL — ABNORMAL HIGH (ref 4.0–10.5)
nRBC: 0 % (ref 0.0–0.2)

## 2021-05-20 LAB — PREALBUMIN: Prealbumin: 13.9 mg/dL — ABNORMAL LOW (ref 18–38)

## 2021-05-20 LAB — LACTIC ACID, PLASMA
Lactic Acid, Venous: 1.3 mmol/L (ref 0.5–1.9)
Lactic Acid, Venous: 1.5 mmol/L (ref 0.5–1.9)

## 2021-05-20 LAB — GLUCOSE, CAPILLARY: Glucose-Capillary: 254 mg/dL — ABNORMAL HIGH (ref 70–99)

## 2021-05-20 LAB — RESP PANEL BY RT-PCR (FLU A&B, COVID) ARPGX2
Influenza A by PCR: NEGATIVE
Influenza B by PCR: NEGATIVE
SARS Coronavirus 2 by RT PCR: NEGATIVE

## 2021-05-20 LAB — CBG MONITORING, ED: Glucose-Capillary: 275 mg/dL — ABNORMAL HIGH (ref 70–99)

## 2021-05-20 LAB — SEDIMENTATION RATE: Sed Rate: 72 mm/hr — ABNORMAL HIGH (ref 0–16)

## 2021-05-20 LAB — HIV ANTIBODY (ROUTINE TESTING W REFLEX): HIV Screen 4th Generation wRfx: NONREACTIVE

## 2021-05-20 LAB — SURGICAL PCR SCREEN
MRSA, PCR: NEGATIVE
Staphylococcus aureus: NEGATIVE

## 2021-05-20 LAB — BASIC METABOLIC PANEL
Anion gap: 10 (ref 5–15)
BUN: 11 mg/dL (ref 6–20)
CO2: 25 mmol/L (ref 22–32)
Calcium: 9.7 mg/dL (ref 8.9–10.3)
Chloride: 95 mmol/L — ABNORMAL LOW (ref 98–111)
Creatinine, Ser: 0.72 mg/dL (ref 0.61–1.24)
GFR, Estimated: >60 mL/min (ref >60)
Glucose, Bld: 396 mg/dL — ABNORMAL HIGH (ref 70–99)
Potassium: 4.1 mmol/L (ref 3.5–5.1)
Sodium: 130 mmol/L — ABNORMAL LOW (ref 135–145)

## 2021-05-20 LAB — C-REACTIVE PROTEIN: CRP: 30.7 mg/dL — ABNORMAL HIGH (ref <1.0)

## 2021-05-20 IMAGING — DX DG FOOT COMPLETE 3+V*R*
3 series · 3 of 3 positions shown · non-contrast
Comparison: [DATE]

CLINICAL DATA: Wound to distal 5th metatarsal

EXAM:
RIGHT FOOT COMPLETE - 3+ VIEW

[foot ap]
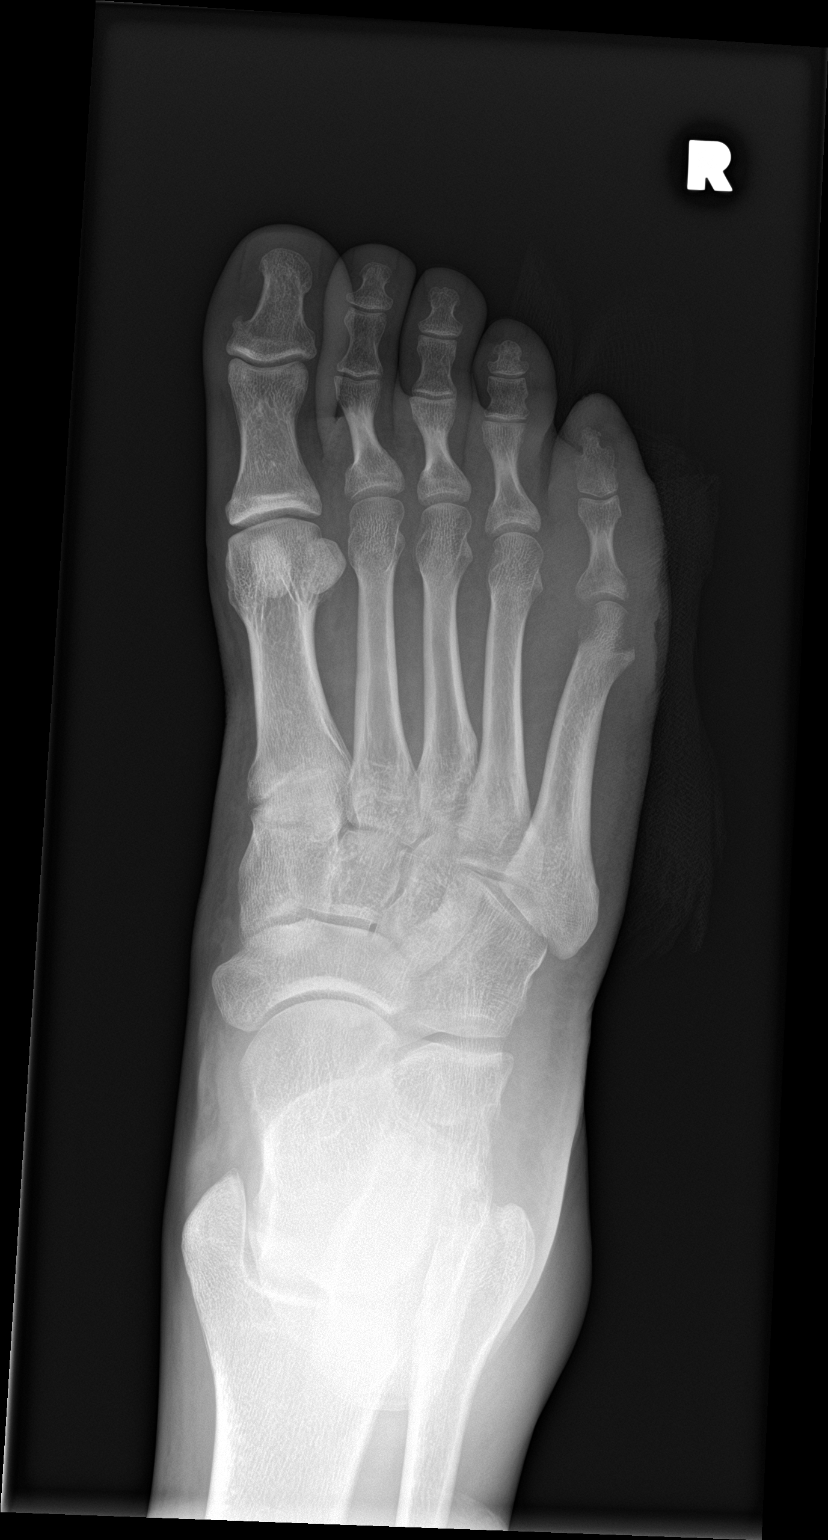

[foot obl]
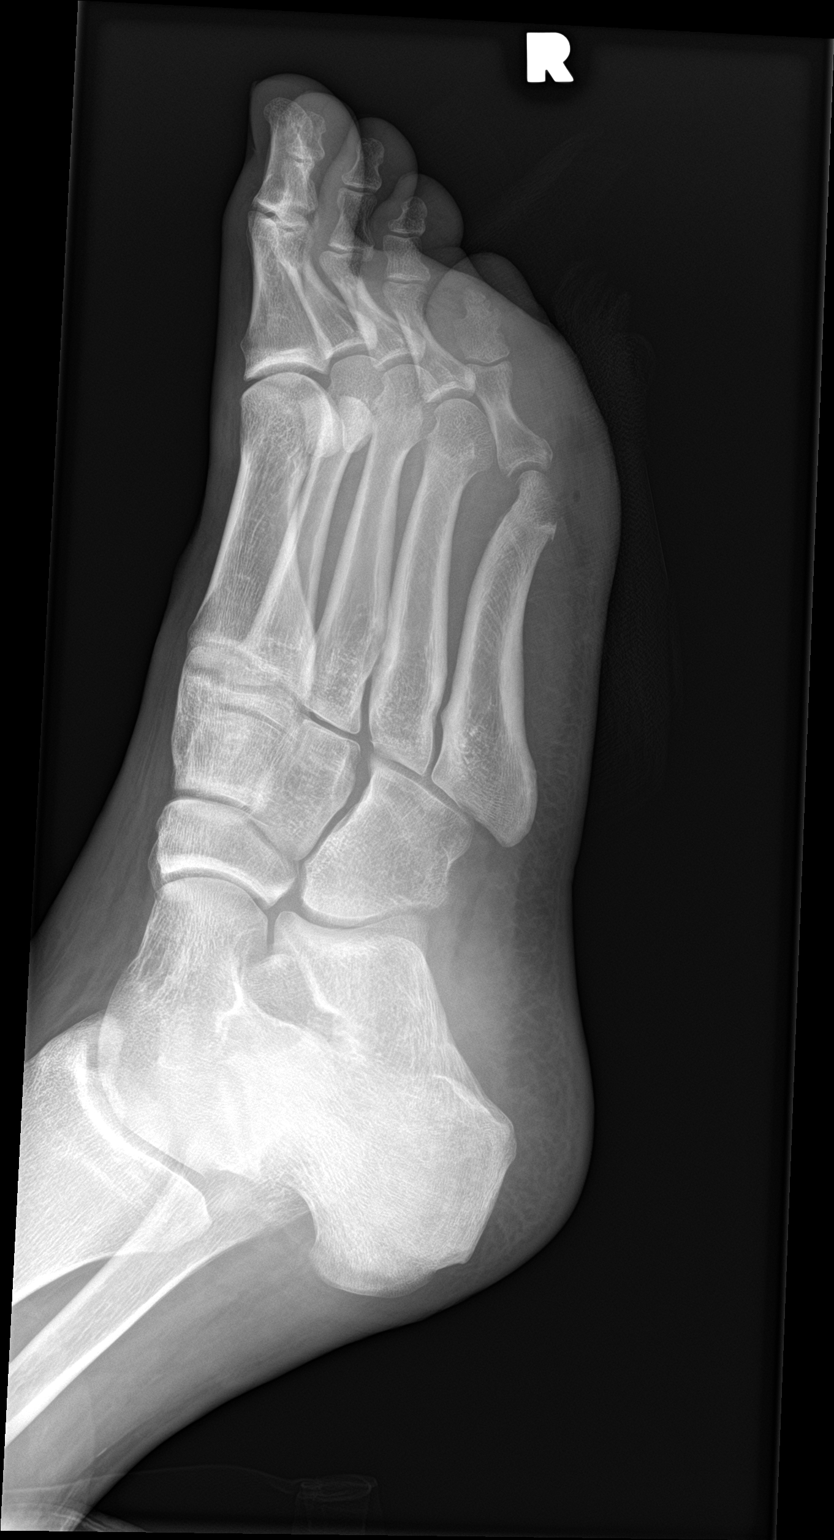

[foot lat]
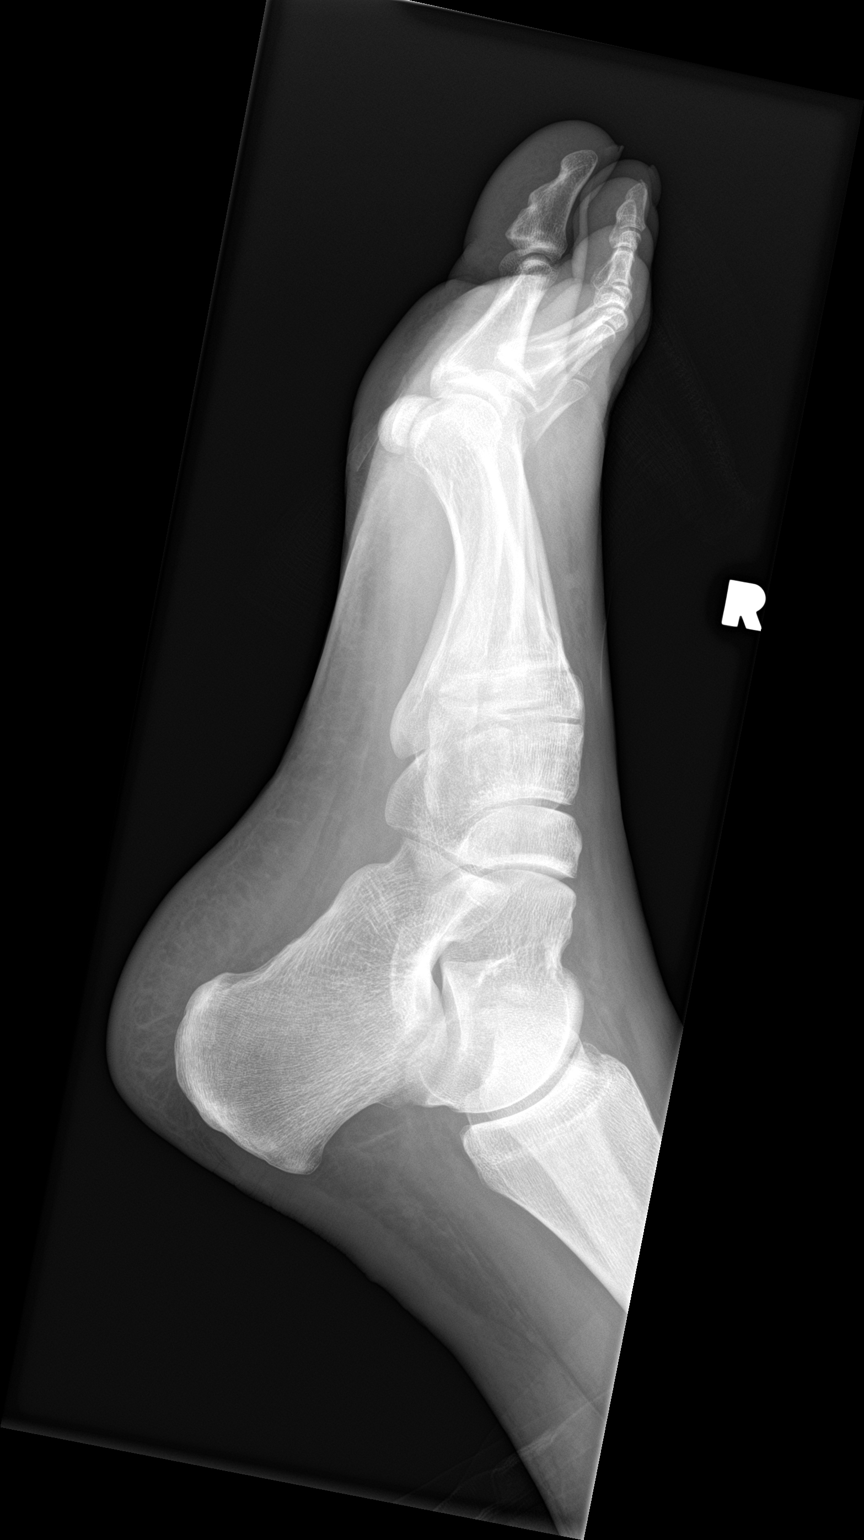

[3 of 3 positions shown; findings below may reference images not displayed]

FINDINGS: Soft tissue wound/ulceration adjacent to the distal 5th metatarsal
head.

Adjacent cortical irregularity/destruction involving the 5th
metatarsal head and likely the lateral base of the 5th proximal
phalanx, suggesting osteomyelitis/septic joint.

Mild lateral soft tissue swelling involving the 5th digit.
IMPRESSION: Osteomyelitis/septic joint involving the 5th metatarsal head and
base of the 5th proximal phalanx.

## 2021-05-20 MED ORDER — VANCOMYCIN HCL IN DEXTROSE 1-5 GM/200ML-% IV SOLN
1000.0000 mg | Freq: Three times a day (TID) | INTRAVENOUS | Status: DC
  Administered 2021-05-20 – 2021-05-21 (×2): 1000 mg via INTRAVENOUS
  Filled 2021-05-20 (×3): qty 200

## 2021-05-20 MED ORDER — ONDANSETRON HCL 4 MG/2ML IJ SOLN: 4.0000 mg | Freq: Four times a day (QID) | INTRAMUSCULAR | Status: DC | PRN

## 2021-05-20 MED ORDER — HYDROCODONE-ACETAMINOPHEN 5-325 MG PO TABS
1.0000 | ORAL_TABLET | Freq: Four times a day (QID) | ORAL | Status: DC | PRN
  Administered 2021-05-25: 1 via ORAL
  Filled 2021-05-20: qty 1

## 2021-05-20 MED ORDER — ISOSORBIDE MONONITRATE ER 30 MG PO TB24
30.0000 mg | ORAL_TABLET | Freq: Every day | ORAL | Status: DC
  Administered 2021-05-21 – 2021-05-26 (×6): 30 mg via ORAL
  Filled 2021-05-20 (×6): qty 1

## 2021-05-20 MED ORDER — ZOLPIDEM TARTRATE 5 MG PO TABS: 10.0000 mg | ORAL_TABLET | Freq: Every evening | ORAL | Status: DC | PRN

## 2021-05-20 MED ORDER — INSULIN DETEMIR 100 UNIT/ML ~~LOC~~ SOLN
12.0000 [IU] | Freq: Two times a day (BID) | SUBCUTANEOUS | Status: DC
  Administered 2021-05-20: 12 [IU] via SUBCUTANEOUS
  Filled 2021-05-20 (×5): qty 0.12

## 2021-05-20 MED ORDER — INSULIN ASPART 100 UNIT/ML IJ SOLN
0.0000 [IU] | Freq: Three times a day (TID) | INTRAMUSCULAR | Status: DC
  Administered 2021-05-20: 8 [IU] via SUBCUTANEOUS
  Administered 2021-05-21 (×2): 5 [IU] via SUBCUTANEOUS
  Administered 2021-05-21 – 2021-05-22 (×2): 3 [IU] via SUBCUTANEOUS
  Administered 2021-05-22: 5 [IU] via SUBCUTANEOUS
  Administered 2021-05-23: 8 [IU] via SUBCUTANEOUS
  Administered 2021-05-23: 3 [IU] via SUBCUTANEOUS
  Administered 2021-05-24: 5 [IU] via SUBCUTANEOUS
  Administered 2021-05-24 – 2021-05-25 (×3): 2 [IU] via SUBCUTANEOUS
  Administered 2021-05-25 (×2): 3 [IU] via SUBCUTANEOUS

## 2021-05-20 MED ORDER — SODIUM CHLORIDE 0.9 % IV SOLN
2.0000 g | Freq: Once | INTRAVENOUS | Status: DC
  Administered 2021-05-20: 2 g via INTRAVENOUS
  Filled 2021-05-20: qty 2

## 2021-05-20 MED ORDER — INSULIN GLARGINE 100 UNIT/ML ~~LOC~~ SOLN
20.0000 [IU] | Freq: Two times a day (BID) | SUBCUTANEOUS | Status: DC
  Administered 2021-05-20 – 2021-05-26 (×11): 20 [IU] via SUBCUTANEOUS
  Filled 2021-05-20 (×14): qty 0.2

## 2021-05-20 MED ORDER — SODIUM CHLORIDE 0.9 % IV SOLN
2.0000 g | Freq: Once | INTRAVENOUS | Status: AC
  Administered 2021-05-20: 2 g via INTRAVENOUS
  Filled 2021-05-20: qty 20

## 2021-05-20 MED ORDER — ONDANSETRON HCL 4 MG PO TABS: 4.0000 mg | ORAL_TABLET | Freq: Four times a day (QID) | ORAL | Status: DC | PRN

## 2021-05-20 MED ORDER — VANCOMYCIN HCL 2000 MG/400ML IV SOLN
2000.0000 mg | Freq: Once | INTRAVENOUS | Status: AC
  Administered 2021-05-20: 2000 mg via INTRAVENOUS
  Filled 2021-05-20: qty 400

## 2021-05-20 MED ORDER — ENOXAPARIN SODIUM 40 MG/0.4ML IJ SOSY
40.0000 mg | PREFILLED_SYRINGE | INTRAMUSCULAR | Status: DC
  Administered 2021-05-20 – 2021-05-25 (×6): 40 mg via SUBCUTANEOUS
  Filled 2021-05-20 (×6): qty 0.4

## 2021-05-20 MED ORDER — PANTOPRAZOLE SODIUM 40 MG PO TBEC
40.0000 mg | DELAYED_RELEASE_TABLET | Freq: Every day | ORAL | Status: DC
  Administered 2021-05-21 – 2021-05-26 (×6): 40 mg via ORAL
  Filled 2021-05-20 (×6): qty 1

## 2021-05-20 MED ORDER — ACETAMINOPHEN 325 MG PO TABS: 650.0000 mg | ORAL_TABLET | Freq: Four times a day (QID) | ORAL | Status: DC | PRN

## 2021-05-20 MED ORDER — SODIUM CHLORIDE 0.9 % IV SOLN
2.0000 g | INTRAVENOUS | Status: DC
  Administered 2021-05-21 – 2021-05-24 (×4): 2 g via INTRAVENOUS
  Filled 2021-05-20 (×5): qty 20

## 2021-05-20 MED ORDER — ATORVASTATIN CALCIUM 10 MG PO TABS
10.0000 mg | ORAL_TABLET | Freq: Every day | ORAL | Status: DC
  Administered 2021-05-21 – 2021-05-26 (×6): 10 mg via ORAL
  Filled 2021-05-20 (×7): qty 1

## 2021-05-20 MED ORDER — ACETAMINOPHEN 650 MG RE SUPP: 650.0000 mg | Freq: Four times a day (QID) | RECTAL | Status: DC | PRN

## 2021-05-20 MED ORDER — ASPIRIN EC 81 MG PO TBEC
81.0000 mg | DELAYED_RELEASE_TABLET | Freq: Every day | ORAL | Status: DC
  Administered 2021-05-21 – 2021-05-26 (×6): 81 mg via ORAL
  Filled 2021-05-20 (×6): qty 1

## 2021-05-20 MED ORDER — CARVEDILOL 25 MG PO TABS
25.0000 mg | ORAL_TABLET | Freq: Two times a day (BID) | ORAL | Status: DC
  Administered 2021-05-20 – 2021-05-26 (×12): 25 mg via ORAL
  Filled 2021-05-20 (×12): qty 1

## 2021-05-20 MED ORDER — INSULIN ASPART 100 UNIT/ML IJ SOLN
0.0000 [IU] | Freq: Every day | INTRAMUSCULAR | Status: DC
  Administered 2021-05-20: 3 [IU] via SUBCUTANEOUS

## 2021-05-20 MED ORDER — VANCOMYCIN HCL IN DEXTROSE 1-5 GM/200ML-% IV SOLN
1000.0000 mg | Freq: Once | INTRAVENOUS | Status: DC
  Filled 2021-05-20: qty 200

## 2021-05-20 MED ORDER — SODIUM CHLORIDE 0.9 % IV SOLN: INTRAVENOUS | Status: AC

## 2021-05-20 MED ORDER — HYDROXYZINE HCL 25 MG PO TABS
50.0000 mg | ORAL_TABLET | Freq: Three times a day (TID) | ORAL | Status: DC | PRN
  Administered 2021-05-21: 50 mg via ORAL
  Filled 2021-05-20: qty 2

## 2021-05-20 NOTE — H&P (Signed)
History and Physical    [name] VWU:[id number] DOB: [birth year] DOA: 05/20/2021  PCP: [name], MD   Patient coming from: Home  I have personally briefly reviewed patient's old medical records in [place]  Chief Complaint: Right food diabetic foot infection  HPI: [name] is a 25 y.o. male with medical history significant of hypertension, diabetes, hyperlipidemia, mild LE arterial insufficiency (normal ankle/arm index on previous arterial dopplers), neuropathy and chronic right foot wound who presents to the emergency department secondary to increased drainage, swelling and redness on the lateral aspect of his right foot.  Patient reports the symptoms has been present for the last 2 to 3 days and progressing.  Due to underlying history of neuropathy he does not express significant pain.  Patient denies chest pain, nausea, vomiting, fever, focal weakness, dysuria, hematuria, melena or hematochezia.  No COVID immunization reports.  COVID PCR negative in the ED  ED Course: Found with worsening right foot wound; elevated WBCs and chest x-ray positive for osteomyelitis.  Case discussed with vascular surgery and podiatry's recommend the patient transferred to [facility] for further evaluation and management.  Patient is at this moment facing the need of surgical intervention/amputation.  Cultures taken and per diabetic foot ulcer broad-spectrum antibiotics initiated (patient allergic to amoxicillin) .  Review of Systems: As per HPI otherwise all other systems reviewed and are negative.   Past Medical History:  Diagnosis Date  . Diabetes mellitus, type II ([place])   . Heart murmur   . Hypertension     Past Surgical History:  Procedure Laterality Date  . PALATE / UVULA BIOPSY / EXCISION     "growth removed"  . UPPER GASTROINTESTINAL ENDOSCOPY     Done in [place] - 2013 ish    Social History  reports that he is a non-smoker but has been  exposed to tobacco smoke. He has never used smokeless tobacco. He reports that he does not drink alcohol and does not use drugs.  Allergies  Allergen Reactions  . Amoxicillin Hives    Has patient had a PCN reaction causing immediate rash, facial/tongue/throat swelling, SOB or lightheadedness with hypotension: No Has patient had a PCN reaction causing severe rash involving mucus membranes or skin necrosis: No Has patient had a PCN reaction that required hospitalization: No Has patient had a PCN reaction occurring within the last 10 years: No If all of the above answers are "NO", then may proceed with Cephalosporin use.     Family History  Problem Relation Age of Onset  . Diabetes Maternal Grandmother   . Thyroid disease Maternal Grandmother   . Hypertension Maternal Grandmother   . Healthy Mother   . Healthy Father   . Healthy Sister   . Healthy Sister     Prior to Admission medications   Medication Sig Start Date End Date Taking? Authorizing Provider  aspirin EC 81 MG tablet Take 1 tablet (81 mg total) by mouth daily. 09/13/20  Yes Verta Ellen., NP  atorvastatin (LIPITOR) 10 MG tablet Take 1 tablet (10 mg total) by mouth daily. 09/13/20 12/12/20 Yes Verta Ellen., NP  carvedilol (COREG) 25 MG tablet Take 1 tablet (25 mg total) by mouth 2 (two) times daily. 12/01/18  Yes Herminio Commons, MD  hydrOXYzine (ATARAX/VISTARIL) 50 MG tablet Take 50 mg by mouth 3 (three) times daily as needed for anxiety.   Yes [provider]  ibuprofen (ADVIL) 800 MG tablet TAKE  1 TABLET BY MOUTH EVERY 8 HOURS AS NEEDED Patient taking differently: Take 800 mg by mouth every 8 (eight) hours as needed for mild pain. 05/10/21  Yes Carole Civil, MD  insulin glargine (LANTUS SOLOSTAR) 100 UNIT/ML Solostar Pen Inject 35 Units into the skin 2 (two) times daily. 10/18/20  Yes Philemon Kingdom, MD  insulin lispro (HUMALOG KWIKPEN) 200 UNIT/ML KwikPen Inject under skin 10-14 units 3x a  day before meals Patient taking differently: Inject 15 Units into the skin 3 (three) times daily before meals. 10/18/20  Yes Philemon Kingdom, MD  isosorbide mononitrate (IMDUR) 30 MG 24 hr tablet Take 1 tablet (30 mg total) by mouth daily. 09/13/20 12/12/20 Yes Verta Ellen., NP  omeprazole (PRILOSEC) 20 MG capsule Take 20 mg by mouth daily.  10/16/18  Yes [provider]  OZEMPIC, 0.25 OR 0.5 MG/DOSE, 2 MG/1.5ML SOPN INJECT 0.5 MG INTO THE SKIN ONCE A WEEK 03/22/21  Yes Philemon Kingdom, MD  zolpidem (AMBIEN) 10 MG tablet Take 10 mg by mouth at bedtime. 08/17/20  Yes [provider]  blood glucose meter kit and supplies KIT Dispense based on patient and insurance preference. Use up to four times daily as directed. (FOR ICD-10 E11.65). 01/13/19   Cassandria Anger, MD  Insulin Pen Needle (CARETOUCH PEN NEEDLES) 31G X 6 MM MISC Use 6x a day 10/18/20   Philemon Kingdom, MD  Lancet Devices [facility]) lancets Use as instructed 4 x daily. e11.65 04/03/17   Cassandria Anger, MD  Lancets (ACCU-CHEK SOFT TOUCH) lancets Use as instructed 08/10/18   Isla Pence, MD  nitroGLYCERIN (NITROSTAT) 0.4 MG SL tablet Place 1 tablet (0.4 mg total) under the tongue every 5 (five) minutes as needed for chest pain. 09/13/20 12/12/20  Verta Ellen., NP    Physical Exam: Vitals:   05/20/21 1123 05/20/21 1159 05/20/21 1236 05/20/21 1254  BP: 125/83  (!) 120/91   Pulse: (!) 101 (!) 106 (!) 113 (!) 105  Resp: 14     Temp: 99 F (37.2 C)     TempSrc: Oral     SpO2: 98% 100% 99% 99%  Weight:      Height:        Constitutional: In no acute distress; reports no nausea or vomiting.  Currently afebrile. Vitals:   05/20/21 1123 05/20/21 1159 05/20/21 1236 05/20/21 1254  BP: 125/83  (!) 120/91   Pulse: (!) 101 (!) 106 (!) 113 (!) 105  Resp: 14     Temp: 99 F (37.2 C)     TempSrc: Oral     SpO2: 98% 100% 99% 99%  Weight:      Height:       Eyes: PERRL, lids and  conjunctivae normal; no icterus or nystagmus. ENMT: Mucous membranes are moist. Posterior pharynx clear of any exudate or lesions. Neck: normal, supple, no masses, no thyromegaly, no JVD. Respiratory: clear to auscultation bilaterally, no wheezing, no crackles. Normal respiratory effort. No accessory muscle use.  Cardiovascular: Mild sinus tachycardia, no rubs, no gallops, no JVD.   Abdomen: no tenderness, no masses palpated. No hepatosplenomegaly. Bowel sounds positive.  Musculoskeletal: Swelling, open wound and active purulent drainage out of Fifth metatarsal lateral aspect; there are also changes of diabetic foot ulcer on his lateral aspect fourth toe.  Please refer to epic media images for further details. Skin: No cyanosis or clubbing; please refer to epic media for images of affected right foot wounds. Neurologic: CN 2-12 grossly intact.  Sensation intact, DTR normal. Strength 5/5 in all 4.  Psychiatric: Normal judgment and insight. Alert and oriented x 3. Normal mood.   Labs on Admission: I have personally reviewed following labs and imaging studies  CBC: Recent Labs  Lab 05/20/21 1011  WBC 18.5*  NEUTROABS 14.7*  HGB 14.0  HCT 42.0  MCV 83.7  PLT 671    Basic Metabolic Panel: Recent Labs  Lab 05/20/21 1011  NA 130*  K 4.1  CL 95*  CO2 25  GLUCOSE 396*  BUN 11  CREATININE 0.72  CALCIUM 9.7    GFR: Estimated Creatinine Clearance: 171.6 mL/min (by C-G formula based on SCr of 0.72 mg/dL).  Urine analysis:    Component Value Date/Time   COLORURINE STRAW (A) 08/10/2018 1425   APPEARANCEUR CLEAR 08/10/2018 1425   LABSPEC 1.035 (H) 08/10/2018 1425   PHURINE 7.0 08/10/2018 1425   GLUCOSEU >=500 (A) 08/10/2018 1425   HGBUR NEGATIVE 08/10/2018 1425   BILIRUBINUR NEGATIVE 08/10/2018 1425   KETONESUR 5 (A) 08/10/2018 1425   PROTEINUR NEGATIVE 08/10/2018 1425   NITRITE NEGATIVE 08/10/2018 1425   LEUKOCYTESUR NEGATIVE 08/10/2018 1425    Radiological Exams on  Admission: DG Foot Complete Right  Result Date: 05/20/2021 CLINICAL DATA:  Wound to distal 5th metatarsal EXAM: RIGHT FOOT COMPLETE - 3+ VIEW COMPARISON:  12/21/2020 FINDINGS: Soft tissue wound/ulceration adjacent to the distal 5th metatarsal head. Adjacent cortical irregularity/destruction involving the 5th metatarsal head and likely the lateral base of the 5th proximal phalanx, suggesting osteomyelitis/septic joint. Mild lateral soft tissue swelling involving the 5th digit. IMPRESSION: Osteomyelitis/septic joint involving the 5th metatarsal head and base of the 5th proximal phalanx. Electronically Signed   By: Julian Hy M.D.   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 -Case has been discussed with vascular surgery and also podiatric service -Patient transfer to [facility] for further evaluation and management -After discussing with podiatry plan is for MRI and most likely will need at least partial fifth ray amputation. -Fluid resuscitation and as needed analgesics provided.  2-HTN (hypertension) -Stable overall -Continue current antihypertensive regimen -Heart healthy diet discussed with patient.  3-Uncontrolled type 1 diabetes mellitus with hyperglycemia (HCC) -Will check A1c -Follow CBGs and adjust hypoglycemic regimen as needed -Started on sliding scale insulin and Lantus.  4-Gastroesophageal reflux disease -Continue PPI  5-HLD (hyperlipidemia) -Continue statins.   DVT prophylaxis: Lovenox Code Status:   Full code Family Communication:  No family at bedside. Disposition Plan:   Patient is from:  Home  Anticipated DC to:  Home  Anticipated DC date:  To be determined  Anticipated DC barriers: Achieving control of diabetic foot  osteomyelitis.  Consults called:  Dr. Jacqualyn Posey (podiatry service Admission status:  Inpatient, MedSurg, length of stay more than 2 midnights.  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  Currently not meeting sepsis criteria but with acute active infection on his foot.  Requiring IV antibiotics and transferred to [facility] for evaluation by podiatry services/vascular surgery in the anticipation of needing surgical debridement/amputation.  Barton Dubois MD [facility]  How to contact the [facility] Attending or Consulting provider [place] or covering provider during  after hours 7P -7A, for this patient?   1. Check the care team in [facility] and look for a) attending/consulting [facility] provider listed and b) the [facility] team listed 2. Log into [email or web address] and use [place]'s universal password to access. If you do not have the password, please contact the hospital operator. 3. Locate the [facility] provider you are looking for under [facility] and page to a number that you can be directly reached. 4. If you still have difficulty reaching the provider, please page the [facility] (Director on Call) for the Hospitalists listed on amion for assistance.  05/20/2021, 1:09 PM

## 2021-05-20 NOTE — Consult Note (Signed)
Reason for Consult: Diabetic foot infection Referring Physician: Dr. Nanda Quinton, MD  [name] is an 25 y.o. male.  HPI: 25 y.o. male with DM, HTN, Neuropathy, and arterial insufficiency with chronic right foot wound presents to the emergency department with some increased drainage from the right lateral foot over the past 2 to 3 days. He has been following with the wound care center at [facility].  States he was treated for spot on the right ankle and wound wrapping that developed a blister on the right lateral foot and has been progressing since then.  He has previously seen Dr. Donnetta Hutching with vascular surgery. He has mild arterial insufficiency with normal ankle arm index and mildly diminished waveforms.   Past Medical History:  Diagnosis Date  . Diabetes mellitus, type II ([place])   . Heart murmur   . Hypertension     Past Surgical History:  Procedure Laterality Date  . PALATE / UVULA BIOPSY / EXCISION     "growth removed"  . UPPER GASTROINTESTINAL ENDOSCOPY     Done in [place] - 2013 ish    Family History  Problem Relation Age of Onset  . Diabetes Maternal Grandmother   . Thyroid disease Maternal Grandmother   . Hypertension Maternal Grandmother   . Healthy Mother   . Healthy Father   . Healthy Sister   . Healthy Sister     Social History:  reports that he is a non-smoker but has been exposed to tobacco smoke. He has never used smokeless tobacco. He reports that he does not drink alcohol and does not use drugs.  Allergies:  Allergies  Allergen Reactions  . Amoxicillin Hives    Has patient had a PCN reaction causing immediate rash, facial/tongue/throat swelling, SOB or lightheadedness with hypotension: No Has patient had a PCN reaction causing severe rash involving mucus membranes or skin necrosis: No Has patient had a PCN reaction that required hospitalization: No Has patient had a PCN reaction occurring within the last 10 years: No If all of the above answers are  "NO", then may proceed with Cephalosporin use.     Medications: I have reviewed the patient's current medications.  Results for orders placed or performed during the hospital encounter of 05/20/21 (from the past 48 hour(s))  Basic metabolic panel     Status: Abnormal   Collection Time: 05/20/21 10:11 AM  Result Value Ref Range   Sodium 130 (L) 135 - 145 mmol/L   Potassium 4.1 3.5 - 5.1 mmol/L   Chloride 95 (L) 98 - 111 mmol/L   CO2 25 22 - 32 mmol/L   Glucose, Bld 396 (H) 70 - 99 mg/dL    Comment: Glucose reference range applies only to samples taken after fasting for at least 8 hours.   BUN 11 6 - 20 mg/dL   Creatinine, Ser 0.72 0.61 - 1.24 mg/dL   Calcium 9.7 8.9 - 10.3 mg/dL   GFR, Estimated >60 >60 mL/min    Comment: (NOTE) Calculated using the CKD-EPI Creatinine Equation (2021)    Anion gap 10 5 - 15    Comment: Performed at [facility], [address], [place],  [address]  CBC with Differential     Status: Abnormal   Collection Time: 05/20/21 10:11 AM  Result Value Ref Range   WBC 18.5 (H) 4.0 - 10.5 K/uL   RBC 5.02 4.22 - 5.81 MIL/uL   Hemoglobin 14.0 13.0 - 17.0 g/dL   HCT 42.0 39.0 - 52.0 %  MCV 83.7 80.0 - 100.0 fL   MCH 27.9 26.0 - 34.0 pg   MCHC 33.3 30.0 - 36.0 g/dL   RDW 12.0 11.5 - 15.5 %   Platelets 303 150 - 400 K/uL   nRBC 0.0 0.0 - 0.2 %   Neutrophils Relative % 81 %   Neutro Abs 14.7 (H) 1.7 - 7.7 K/uL   Lymphocytes Relative 8 %   Lymphs Abs 1.5 0.7 - 4.0 K/uL   Monocytes Relative 10 %   Monocytes Absolute 1.9 (H) 0.1 - 1.0 K/uL   Eosinophils Relative 1 %   Eosinophils Absolute 0.2 0.0 - 0.5 K/uL   Basophils Relative 0 %   Basophils Absolute 0.1 0.0 - 0.1 K/uL   Immature Granulocytes 0 %   Abs Immature Granulocytes 0.08 (H) 0.00 - 0.07 K/uL    Comment: Performed at [facility], [address], [place], [place] [address]  Lactic acid, plasma     Status: None   Collection Time: 05/20/21 10:11 AM  Result Value Ref Range   Lactic Acid,  Venous 1.3 0.5 - 1.9 mmol/L    Comment: Performed at [facility], [address], [place], [place] [address]  Resp Panel by RT-PCR (Flu A&B, Covid) Nasopharyngeal Swab     Status: None   Collection Time: 05/20/21 12:46 PM   Specimen: Nasopharyngeal Swab; Nasopharyngeal(NP) swabs in vial transport medium  Result Value Ref Range   SARS Coronavirus 2 by RT PCR NEGATIVE NEGATIVE    Comment: (NOTE) SARS-CoV-2 target nucleic acids are NOT DETECTED.  The SARS-CoV-2 RNA is generally detectable in upper respiratory specimens during the acute phase of infection. The lowest concentration of SARS-CoV-2 viral copies this assay can detect is 138 copies/mL. A negative result does not preclude SARS-Cov-2 infection and should not be used as the sole basis for treatment or other patient management decisions. A negative result may occur with  improper specimen collection/handling, submission of specimen other than nasopharyngeal swab, presence of viral mutation(s) within the areas targeted by this assay, and inadequate number of viral copies(<138 copies/mL). A negative result must be combined with clinical observations, patient history, and epidemiological information. The expected result is Negative.  Fact Sheet for Patients:  EntrepreneurPulse.com.au  Fact Sheet for Healthcare Providers:  IncredibleEmployment.be  This test is no t yet approved or cleared by the [place] FDA and  has been authorized for detection and/or diagnosis of SARS-CoV-2 by FDA under an Emergency Use Authorization (EUA). This EUA will remain  in effect (meaning this test can be used) for the duration of the COVID-19 declaration under Section 564(b)(1) of the Act, 21 U.S.C.section 360bbb-3(b)(1), unless the authorization is terminated  or revoked sooner.       Influenza A by PCR NEGATIVE NEGATIVE   Influenza B by PCR NEGATIVE NEGATIVE    Comment: (NOTE) The Xpert Xpress  SARS-CoV-2/FLU/RSV plus assay is intended as an aid in the diagnosis of influenza from Nasopharyngeal swab specimens and should not be used as a sole basis for treatment. Nasal washings and aspirates are unacceptable for Xpert Xpress SARS-CoV-2/FLU/RSV testing.  Fact Sheet for Patients: EntrepreneurPulse.com.au  Fact Sheet for Healthcare Providers: IncredibleEmployment.be  This test is not yet approved or cleared by the [place] FDA and has been authorized for detection and/or diagnosis of SARS-CoV-2 by FDA under an Emergency Use Authorization (EUA). This EUA will remain in effect (meaning this test can be used) for the duration of the COVID-19 declaration under Section 564(b)(1) of the Act, 21  U.S.C. section 360bbb-3(b)(1), unless the authorization is terminated or revoked.  Performed at [facility], [address], [place], [place] [address]   Lactic acid, plasma     Status: None   Collection Time: 05/20/21  1:24 PM  Result Value Ref Range   Lactic Acid, Venous 1.5 0.5 - 1.9 mmol/L    Comment: Performed at [facility], [address] [place], [place] [address]  Culture, blood (routine x 2)     Status: None (Preliminary result)   Collection Time: 05/20/21  1:24 PM   Specimen: BLOOD LEFT ARM  Result Value Ref Range   Specimen Description BLOOD LEFT ARM BOTTLES DRAWN AEROBIC AND ANAEROBIC    Special Requests      Blood Culture adequate volume Performed at [facility], [address] [place], [place] [address]    Culture PENDING    Report Status PENDING   Culture, blood (routine x 2)     Status: None (Preliminary result)   Collection Time: 05/20/21  1:24 PM   Specimen: BLOOD LEFT HAND  Result Value Ref Range   Specimen Description      BLOOD LEFT HAND BOTTLES DRAWN AEROBIC AND ANAEROBIC   Special Requests      Blood Culture adequate volume Performed at [facility], [address] [place], [place] [address]    Culture PENDING     Report Status PENDING   Sedimentation rate     Status: Abnormal   Collection Time: 05/20/21  1:42 PM  Result Value Ref Range   Sed Rate 72 (H) 0 - 16 mm/hr    Comment: Performed at [facility], [address] [place], [place] [address]  C-reactive protein     Status: Abnormal   Collection Time: 05/20/21  1:42 PM  Result Value Ref Range   CRP 30.7 (H) <1.0 mg/dL    Comment: Performed at [facility], [address], [place], [place] [address]  CBG monitoring, ED     Status: Abnormal   Collection Time: 05/20/21  2:07 PM  Result Value Ref Range   Glucose-Capillary 275 (H) 70 - 99 mg/dL    Comment: Glucose reference range applies only to samples taken after fasting for at least 8 hours.    DG Foot Complete Right  Result Date: 05/20/2021 CLINICAL DATA:  Wound to distal 5th metatarsal EXAM: RIGHT FOOT COMPLETE - 3+ VIEW COMPARISON:  12/21/2020 FINDINGS: Soft tissue wound/ulceration adjacent to the distal 5th metatarsal head. Adjacent cortical irregularity/destruction involving the 5th metatarsal head and likely the lateral base of the 5th proximal phalanx, suggesting osteomyelitis/septic joint. Mild lateral soft tissue swelling involving the 5th digit. IMPRESSION: Osteomyelitis/septic joint involving the 5th metatarsal head and base of the 5th proximal phalanx. Electronically Signed   By: Julian Hy M.D.   On: 05/20/2021 10:40    Review of Systems Blood pressure 124/83, pulse (!) 103, temperature 97.7 F (36.5 C), temperature source Oral, resp. rate 16, height $RemoveBe'6\' 3"'TmADiybbe$  (1.905 m), weight 97.5 kg, SpO2 99 %. Physical Exam General: AAO x3, NAD  Dermatological: Full-thickness ulceration present lateral aspect of the fifth metatarsal head which probes to bone.  There is edema present of the right foot compared to clinical extremity with extensive erythema of the foot, mostly localized to the lateral aspect.  Mild purulence coming from the wound.  On the sulcus of the fourth and fifth toe yellow  discoloration.  Ulceration to the lateral fourth toe on the IPJ.  Concern for abscess/medial tracking of the infection  but there is no fluctuance or crepitation.        Vascular: Dorsalis Pedis artery and Posterior Tibial artery pedal pulses are palpable bilateral with immedate capillary fill time.There is no pain with calf compression, swelling, warmth, erythema.   Neruologic: Sensation decreased.  Musculoskeletal: Mild pain while probing the wound.  Assessment/Plan: Osteomyelitis, septic arthritis 5th metatarsal head/toe   WBC 18.5, lactic acid 1.3 then 1.5, CRP 30.7, ESR 72 blood cultures pending. Tmax 99, currently afebrile.  Reviewed the x-rays.  Discussed with the patient osteomyelitis and the treatment options of this including limb salvage versus amputation. My recommendation at this time is at least a partial 5th ray amputation.  However I would like to get an MRI with contrast prior to determine the extent of the infection.  This was ordered today.  Continue IV antibiotics currently on ceftriaxone, vancomycin.  Elevation. I spoke with the patient's mom via phone at his request.  Discussed surgery with the patient potentially for tomorrow. He seems to be somewhat overwhelmed but this. I will make him NPO for potential surgery tomorrow.    Trula Slade 05/20/2021, 3:48 PM

## 2021-05-20 NOTE — ED Provider Notes (Signed)
Emergency Department Provider Note   I have reviewed the triage vital signs and the nursing notes.   HISTORY  Chief Complaint Foot Ulcer   HPI [name] is a 25 y.o. male with DM, HTN, Neuropathy, and arterial insufficiency with chronic right foot wound presents to the emergency department with some increased drainage from the right lateral foot over the past 2 to 3 days.  Patient is followed by the wound center here at [facility] for regular debridement and treatments.  He is wearing his [place] boots and changing dressings as recommended.  He states that 2 to 3 days ago he noticed a "milkshake" like drainage from the wound with some occasional blood.  He has little feeling in his foot but does feel some discomfort in the area which is unusual.  Denies any known drainage from the wound on the fourth toe which is also being treated/evaluated.  States he has "felt hot" at times but no documented fevers. No vomiting or diarrhea. No other symptoms.    Past Medical History:  Diagnosis Date  . Diabetes mellitus, type II ([place])   . Heart murmur   . Hypertension     Patient Active Problem List   Diagnosis Date Noted  . Acute osteomyelitis of metatarsal bone of right foot ([place]) 05/20/2021  . Anxiety 12/21/2020  . Atypical chest pain 12/21/2020  . Body mass index (BMI) 30.0-30.9, adult 12/21/2020  . Chronic pain 12/21/2020  . Disorder of teeth and supporting structures, unspecified 12/21/2020  . Epigastric pain 12/21/2020  . Gastroesophageal reflux disease 12/21/2020  . Headache disorder 12/21/2020  . Insomnia 12/21/2020  . Leukocytosis 12/21/2020  . Oral mucositis (ulcerative), unspecified 12/21/2020  . Panic disorder 12/21/2020  . Poor sleep pattern 12/21/2020  . Sinus tachycardia 12/21/2020  . Systolic murmur 64/40/3474  . Uncontrolled type 1 diabetes mellitus with hyperglycemia ([place]) 11/26/2018  . Personal history of noncompliance with medical treatment, presenting hazards  to health 03/15/2017  . Essential hypertension, benign 03/15/2017  . Low back pain 03/14/2017  . Neck pain 03/14/2017  . Uncontrolled diabetes mellitus with diabetic autonomic neuropathy, with Iria Jamerson-term current use of insulin ([place]) 03/01/2017  . Type 2 diabetes mellitus with hyperglycemia ([place]) 10/19/2015    Past Surgical History:  Procedure Laterality Date  . PALATE / UVULA BIOPSY / EXCISION     "growth removed"  . UPPER GASTROINTESTINAL ENDOSCOPY     Done in [place] - 2013 ish    Allergies Amoxicillin  Family History  Problem Relation Age of Onset  . Diabetes Maternal Grandmother   . Thyroid disease Maternal Grandmother   . Hypertension Maternal Grandmother   . Healthy Mother   . Healthy Father   . Healthy Sister   . Healthy Sister     Social History Social History   Tobacco Use  . Smoking status: Passive Smoke Exposure - Never Smoker  . Smokeless tobacco: Never Used  Vaping Use  . Vaping Use: Never used  Substance Use Topics  . Alcohol use: No  . Drug use: No    Review of Systems  Constitutional: No fever/chills Eyes: No visual changes. ENT: No sore throat. Cardiovascular: Denies chest pain. Respiratory: Denies shortness of breath. Gastrointestinal: No abdominal pain.  No nausea, no vomiting.  No diarrhea.  No constipation. Genitourinary: Negative for dysuria. Musculoskeletal: Negative for back pain. Right foot pain.  Skin: Ulcers to the right foot with drainage.  Neurological: Negative for headaches, focal weakness or numbness.  10-point ROS  otherwise negative.  ____________________________________________   PHYSICAL EXAM:  VITAL SIGNS: ED Triage Vitals  Enc Vitals Group     BP 05/20/21 0906 (!) 156/98     Pulse Rate 05/20/21 0906 (!) 116     Resp 05/20/21 0906 18     Temp 05/20/21 0906 98.7 F (37.1 C)     Temp Source 05/20/21 0906 Oral     SpO2 05/20/21 0906 99 %     Weight 05/20/21 0907 215 lb (97.5 kg)     Height 05/20/21 0907 6'  3" (1.905 m)   Constitutional: Alert and oriented. Well appearing and in no acute distress. Eyes: Conjunctivae are normal.  Head: Atraumatic. Nose: No congestion/rhinnorhea. Mouth/Throat: Mucous membranes are moist.  Neck: No stridor.   Cardiovascular: Normal rate, regular rhythm. Good peripheral circulation. Grossly normal heart sounds.   Respiratory: Normal respiratory effort.  No retractions. Lungs CTAB. Gastrointestinal: No distention.  Musculoskeletal: Ulceration noted at the distal fifth metatarsal as pictured below with some oozing blood and purulence.  Wound also noted to the lateral aspect of the fourth toe.  This wound is well-appearing.  Neurologic:  Normal speech and language. No sensation to the feet bilaterally.  Skin:  Skin is warm and dry. Wound to the right right foot and 4th toe pictured below.        ____________________________________________   LABS (all labs ordered are listed, but only abnormal results are displayed)  Labs Reviewed  BASIC METABOLIC PANEL - Abnormal; Notable for the following components:      Result Value   Sodium 130 (*)    Chloride 95 (*)    Glucose, Bld 396 (*)    All other components within normal limits  CBC WITH DIFFERENTIAL/PLATELET - Abnormal; Notable for the following components:   WBC 18.5 (*)    Neutro Abs 14.7 (*)    Monocytes Absolute 1.9 (*)    Abs Immature Granulocytes 0.08 (*)    All other components within normal limits  CULTURE, BLOOD (ROUTINE X 2)  CULTURE, BLOOD (ROUTINE X 2)  RESP PANEL BY RT-PCR (FLU A&B, COVID) [id number]  LACTIC ACID, PLASMA  LACTIC ACID, PLASMA   ____________________________________________  RADIOLOGY  DG Foot Complete Right  Result Date: 05/20/2021 CLINICAL DATA:  Wound to distal 5th metatarsal EXAM: RIGHT FOOT COMPLETE - 3+ VIEW COMPARISON:  12/21/2020 FINDINGS: Soft tissue wound/ulceration adjacent to the distal 5th metatarsal head. Adjacent cortical irregularity/destruction involving the  5th metatarsal head and likely the lateral base of the 5th proximal phalanx, suggesting osteomyelitis/septic joint. Mild lateral soft tissue swelling involving the 5th digit. IMPRESSION: Osteomyelitis/septic joint involving the 5th metatarsal head and base of the 5th proximal phalanx. Electronically Signed   By: Julian Hy M.D.   On: 05/20/2021 10:40    ____________________________________________   PROCEDURES  Procedure(s) performed:   Procedures   ____________________________________________   INITIAL IMPRESSION / ASSESSMENT AND PLAN / ED COURSE  Pertinent labs & imaging results that were available during my care of the patient were reviewed by me and considered in my medical decision making (see chart for details).   Patient presents to the emergency department with drainage from the chronic wound of the right fifth metatarsal.  I reviewed the images from 4 days ago showing minimal to no drainage, granulation tissue, well demarcated edges.  Today's exam is consistent with more drainage and evolution of the wound beyond the clearly demarcated edges 4 days ago.  I do have some concern for possible underlying osteomyelitis.  The wound on the fourth toe appears similar to images 4 days ago.  Plan for labs and plain films of the foot.  Patient's x-ray shows osteomyelitis and significant leukocytosis.  Patient has seen Dr. Donnetta Hutching although vascular studies were normal.  I discussed the case with Dr. Carlis Abbott.  He states that with normal vascular studies typically orthopedics or podiatry would handle wound debridement and possible amputation.  The patient has never followed with an orthopedist. Spoke with Dr. Jacqualyn Posey with podiatry down at [facility].  They are happy to consult and assist the hospitalist with the patient's care.   Discussed patient's case with [facility], Dr. Dyann Kief to request admission. Patient and family (if present) updated with plan. Care transferred to [facility] service.  I reviewed  all nursing notes, vitals, pertinent old records, EKGs, labs, imaging (as available).  ____________________________________________  FINAL CLINICAL IMPRESSION(S) / ED DIAGNOSES  Final diagnoses:  Osteomyelitis of right foot, unspecified type ([place])     MEDICATIONS GIVEN DURING THIS VISIT:  Medications  vancomycin (VANCOREADY) IVPB 2000 mg/400 mL (has no administration in time range)  cefTRIAXone (ROCEPHIN) 2 g in sodium chloride 0.9 % 100 mL IVPB (2 g Intravenous New Bag/Given 05/20/21 1252)  enoxaparin (LOVENOX) injection 40 mg (has no administration in time range)     Note:  This document was prepared using Dragon voice recognition software and may include unintentional dictation errors.  Nanda Quinton, MD, [facility]    Abdullahi Vallone, [facility], MD 05/20/21 1257

## 2021-05-20 NOTE — Progress Notes (Signed)
Pharmacy Antibiotic Note  [name] is a 25 y.o. male admitted on 05/20/2021 with cellulitis.  Pharmacy has been consulted for Vancomycin dosing.  Plan: Vancomycin 2000mg  IV loading dose, then 1000mg   IV every 8 hours.  Goal trough 10-15 mcg/mL.  F/U cxs and clinical progress Monitor V/S, labs and levels as indicated  Height: 6\' 3"  (190.5 cm) Weight: 97.5 kg (215 lb) IBW/kg (Calculated) : 84.5  Temp (24hrs), Avg:98.9 F (37.2 C), Min:98.7 F (37.1 C), Max:99 F (37.2 C)  Recent Labs  Lab 05/20/21 1011  WBC 18.5*  CREATININE 0.72  LATICACIDVEN 1.3    Estimated Creatinine Clearance: 171.6 mL/min (by C-G formula based on SCr of 0.72 mg/dL).    Allergies  Allergen Reactions  . Amoxicillin Hives    Has patient had a PCN reaction causing immediate rash, facial/tongue/throat swelling, SOB or lightheadedness with hypotension: No Has patient had a PCN reaction causing severe rash involving mucus membranes or skin necrosis: No Has patient had a PCN reaction that required hospitalization: No Has patient had a PCN reaction occurring within the last 10 years: No If all of the above answers are "NO", then may proceed with Cephalosporin use.     Antimicrobials this admission: Vancomcyin 5/28>>  Ceftriaxone 5/28 >>   Dose adjustments this admission: prn  Microbiology results: 5/28 BCx: pending  MRSA PCR:  Thank you for allowing pharmacy to be a part of this patient's care.  Isac Sarna, BS Pharm D, BCPS Clinical Pharmacist Pager [phone] 05/20/2021 1:00 PM

## 2021-05-20 NOTE — ED Notes (Signed)
[facility] here for transport. 

## 2021-05-20 NOTE — Progress Notes (Signed)
Pt arrived via stretcher / ambulance service from [facility] accompanied by two attendants.  Patient presents with osteomyelitis of fifth toe right foot.  States "I feel pain only when I walk on it".  Has large supportive ortho boot for ambulation.  States his wound often "leaks dark tan liquid" at times.  Dr. Jacqualyn Posey in to assess and ultimately placed compression ace wrap on foot.  Patient is alert and oriented.  States has had multiple injuries in past on his foot.  Hgb A1C noted to be 11.4.  Pt to have MRI of foot and to be NPO after midnight as per MD orders.  Bed in low position with brakes on.  Pt introduced to staff and to unit routine.  Advised as to high fall risks and appears to comprehend / follow directions well.

## 2021-05-20 NOTE — ED Triage Notes (Signed)
Pt to er, pt states that he is here because he had surgery on his foot, states that he tried to go to wound care yesterday and they were too busy to see him, states that he is here for redness and swelling to his R foot.

## 2021-05-21 ENCOUNTER — Inpatient Hospital Stay (HOSPITAL_COMMUNITY): Payer: BC Managed Care – PPO

## 2021-05-21 ENCOUNTER — Inpatient Hospital Stay (HOSPITAL_COMMUNITY): Payer: BC Managed Care – PPO | Admitting: Anesthesiology

## 2021-05-21 ENCOUNTER — Encounter (HOSPITAL_COMMUNITY): Payer: Self-pay | Admitting: Internal Medicine

## 2021-05-21 ENCOUNTER — Encounter (HOSPITAL_COMMUNITY)
Admission: EM | Disposition: A | Discharge status: Home Health | Payer: Self-pay | Source: Home / Self Care | Attending: Internal Medicine

## 2021-05-21 DIAGNOSIS — M86171 Other acute osteomyelitis, right ankle and foot: Secondary | ICD-10-CM

## 2021-05-21 HISTORY — PX: AMPUTATION: SHX166

## 2021-05-21 LAB — GLUCOSE, CAPILLARY
Glucose-Capillary: 229 mg/dL — ABNORMAL HIGH (ref 70–99)
Glucose-Capillary: 205 mg/dL — ABNORMAL HIGH (ref 70–99)
Glucose-Capillary: 168 mg/dL — ABNORMAL HIGH (ref 70–99)
Glucose-Capillary: 178 mg/dL — ABNORMAL HIGH (ref 70–99)
Glucose-Capillary: 195 mg/dL — ABNORMAL HIGH (ref 70–99)

## 2021-05-21 IMAGING — MR MR FOOT*R* WO/W CM
9 series · 40 of 40 positions shown · IV contrast (Gadavist)
Comparison: None.

CLINICAL DATA: Worsening infection and drainage of the right foot.

EXAM:
MRI OF THE RIGHT FOREFOOT WITHOUT AND WITH CONTRAST
TECHNIQUE: Multiplanar, multisequence MR imaging of the right forefoot was
performed before and after the administration of intravenous
contrast.
CONTRAST:  9mL GADAVIST GADOBUTROL 1 MMOL/ML IV SOLN

[Series 6: T2 fat-sat · oblique · right · 3.0mm · 0.49mm/px · 6 of 49 slices shown (1 of 2)]
[im 1/49]
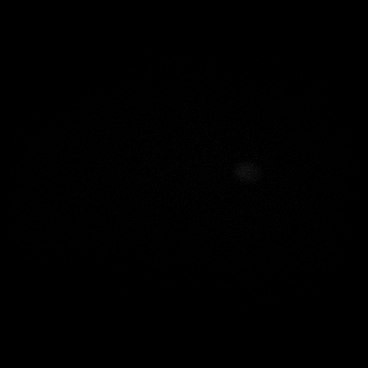
[im 10/49]
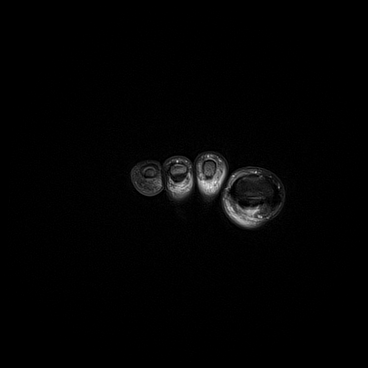
[im 20/49]
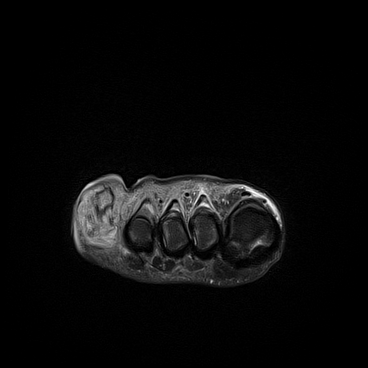
[im 29/49]
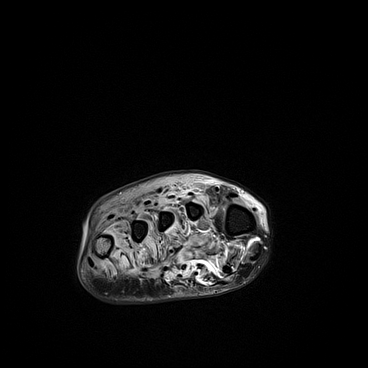
[im 39/49]
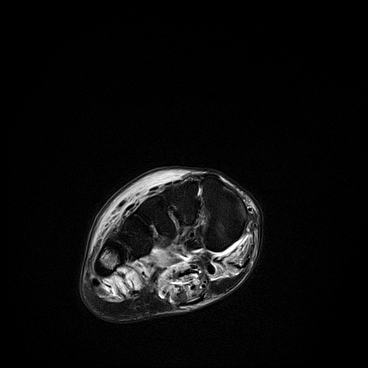
[im 49/49]
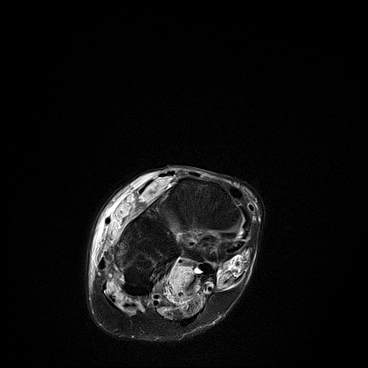

[Series 7: T1 · oblique · right · 3.0mm · 0.47mm/px · 6 of 50 slices shown (1 of 2)]
[im 1/50]
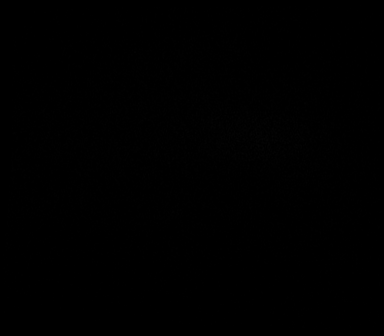
[im 10/50]
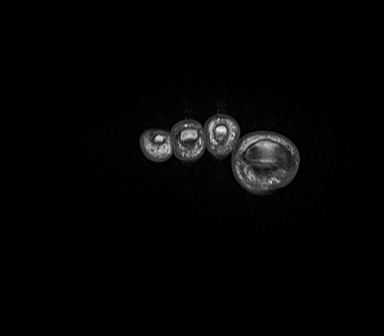
[im 20/50]
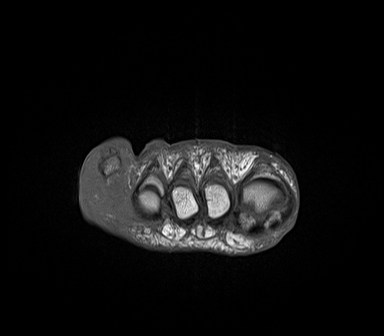
[im 30/50]
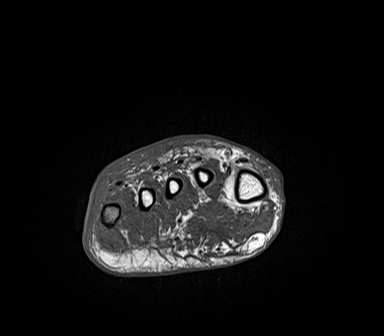
[im 40/50]
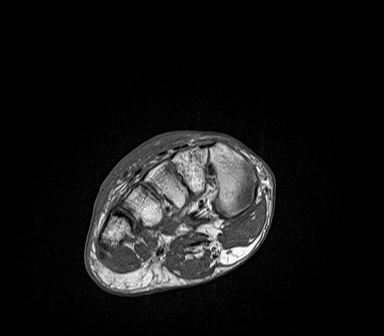
[im 50/50]
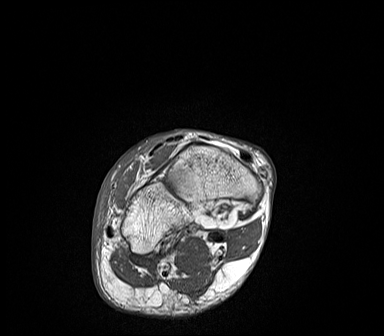

[Series 9: STIR · sagittal · right · 3.0mm · 0.70mm/px · 4 of 35 slices shown]
[im 1/35]
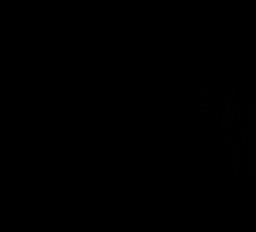
[im 12/35]
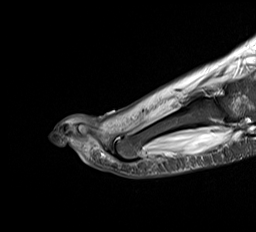
[im 23/35]
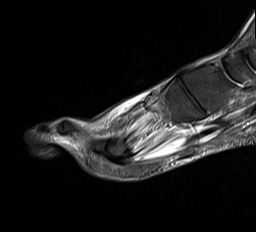
[im 35/35]
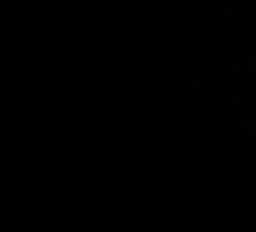

[Series 10: T1 · oblique · right · 3.0mm · 0.52mm/px · 3 of 26 slices shown (2 of 2)]
[im 1/26]
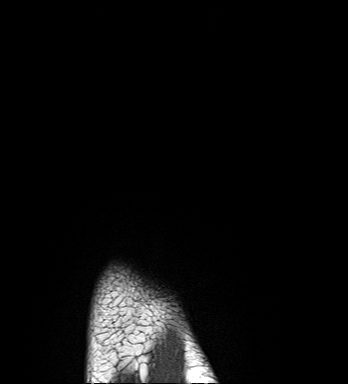
[im 13/26]
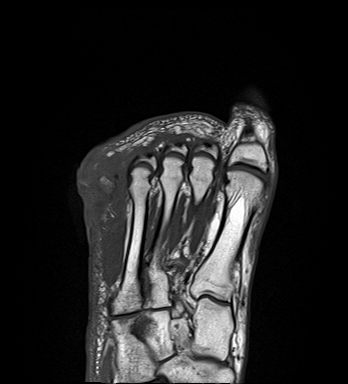
[im 26/26]
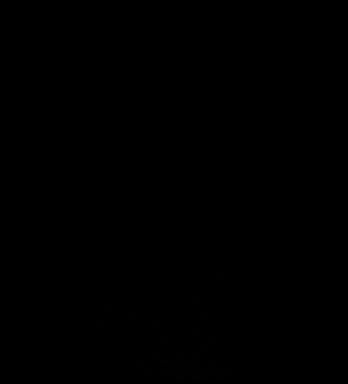

[Series 11: T2 fat-sat · oblique · right · 3.0mm · 0.60mm/px · 3 of 26 slices shown (2 of 2)]
[im 1/26]
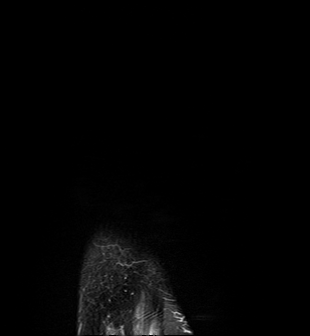
[im 13/26]
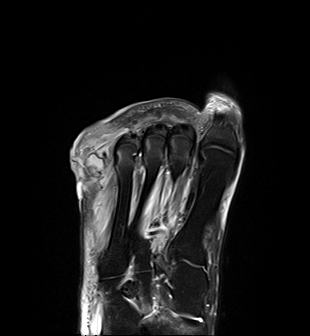
[im 26/26]
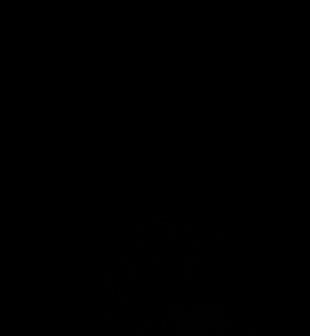

[Series 12: T1 fat-sat · oblique · non-contrast · right · 3.0mm · 0.59mm/px · 6 of 50 slices shown (1 of 3)]
[im 1/50]
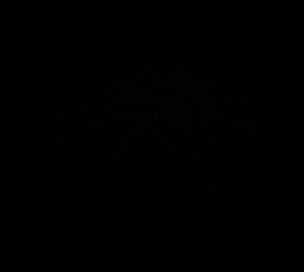
[im 10/50]
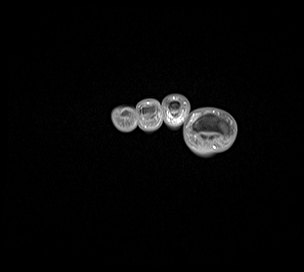
[im 20/50]
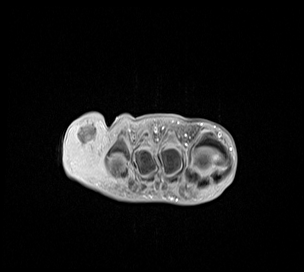
[im 30/50]
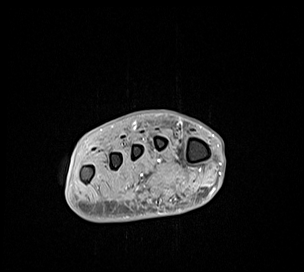
[im 40/50]
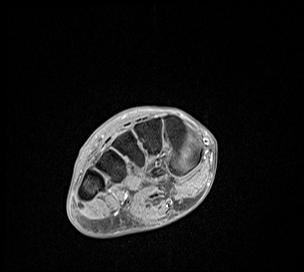
[im 50/50]
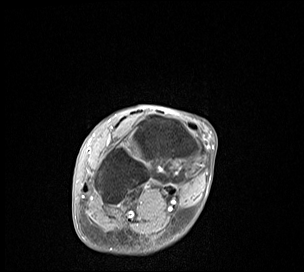

[Series 13: T1 fat-sat post-contrast · oblique · right · 3.0mm · 0.59mm/px · 5 of 44 slices shown]
[im 1/44]
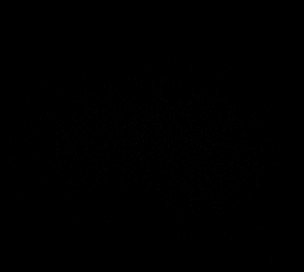
[im 11/44]
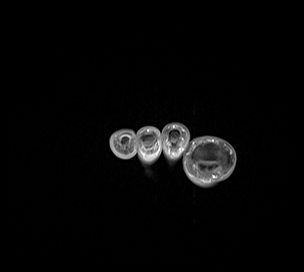
[im 22/44]
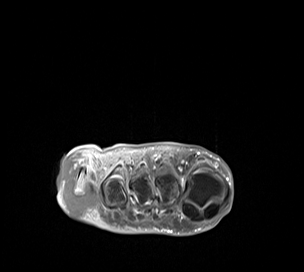
[im 33/44]
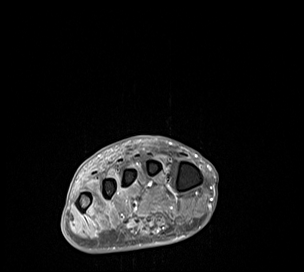
[im 44/44]
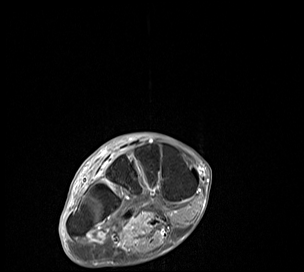

[Series 14: T1 fat-sat · sagittal · right · 3.0mm · 0.59mm/px · 4 of 37 slices shown (2 of 3)]
[im 1/37]
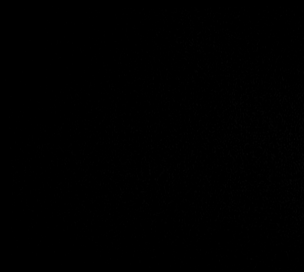
[im 13/37]
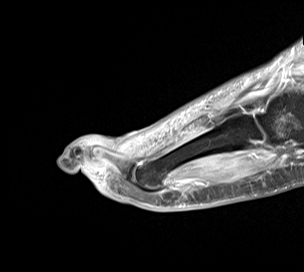
[im 25/37]
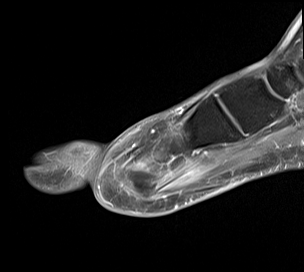
[im 37/37]
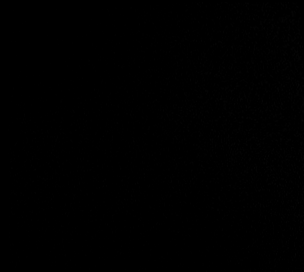

[Series 16: T1 fat-sat · oblique · right · 3.0mm · 0.56mm/px · 3 of 26 slices shown (3 of 3)]
[im 1/26]
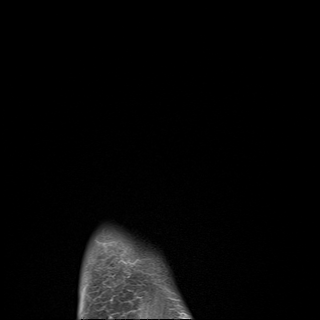
[im 13/26]
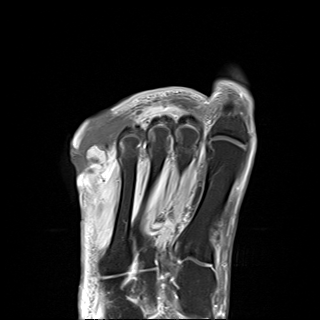
[im 26/26]
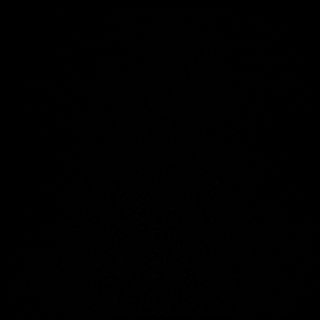

[40 of 40 positions shown; findings below may reference images not displayed]

FINDINGS: Bones/Joint/Cartilage

Large soft tissue wound overlying the fifth metatarsal head.
Cortical destruction of the fifth metatarsal head with extensive
bone marrow edema throughout the first metatarsal head and extending
into the shaft most consistent with osteomyelitis. Bone destruction
at the base of the fifth proximal phalanx with associated bone
marrow edema consistent with osteomyelitis. Complex fluid collection
centered around the fifth MTP joint measuring 2.9 x 2.5 x 1.6 cm
extending from the plantar aspect to the dorsal aspect consistent
with an abscess. Bone marrow edema in the fifth distal phalanx which
may be reactive versus secondary to osteomyelitis.

Marrow edema in the head of fourth proximal phalanx without cortical
destruction may reflect reactive edema versus early osteomyelitis.

No acute fracture or dislocation. Normal alignment. No joint
effusion.

Ligaments

Collateral ligaments are intact.  Lisfranc ligament is intact.

Muscles and Tendons
Flexor, peroneal and extensor compartment tendons are intact.
Diffuse T2 hyperintense signal throughout the plantar musculature
likely neurogenic. An element of myositis is not completely
excluded.

Soft tissue
No other fluid collection or hematoma. No soft tissue mass. Severe
soft tissue edema along the dorsal aspect of the foot concerning for
cellulitis.
IMPRESSION: 1. Large soft tissue wound overlying the fifth metatarsal head.
Osteomyelitis of the fifth metatarsal head and base of the fifth
proximal phalanx. Complex fluid collection centered around the fifth
MTP joint measuring 2.9 x 2.5 x 1.6 cm extending from the plantar
aspect to the dorsal aspect consistent with an abscess. Bone marrow
edema in the fifth distal phalanx which may be reactive versus
secondary to osteomyelitis.
2. Marrow edema in the head of fourth proximal phalanx without
cortical destruction may reflect reactive edema versus early
osteomyelitis.
3. Severe soft tissue edema along the dorsal aspect of the foot
concerning for cellulitis.

## 2021-05-21 IMAGING — DX DG FOOT 2V*R*
2 series · 2 of 2 positions shown · non-contrast
Comparison: [DATE].

CLINICAL DATA: Status post surgery for osteomyelitis.

EXAM:
RIGHT FOOT - 2 VIEW

[foot ap]
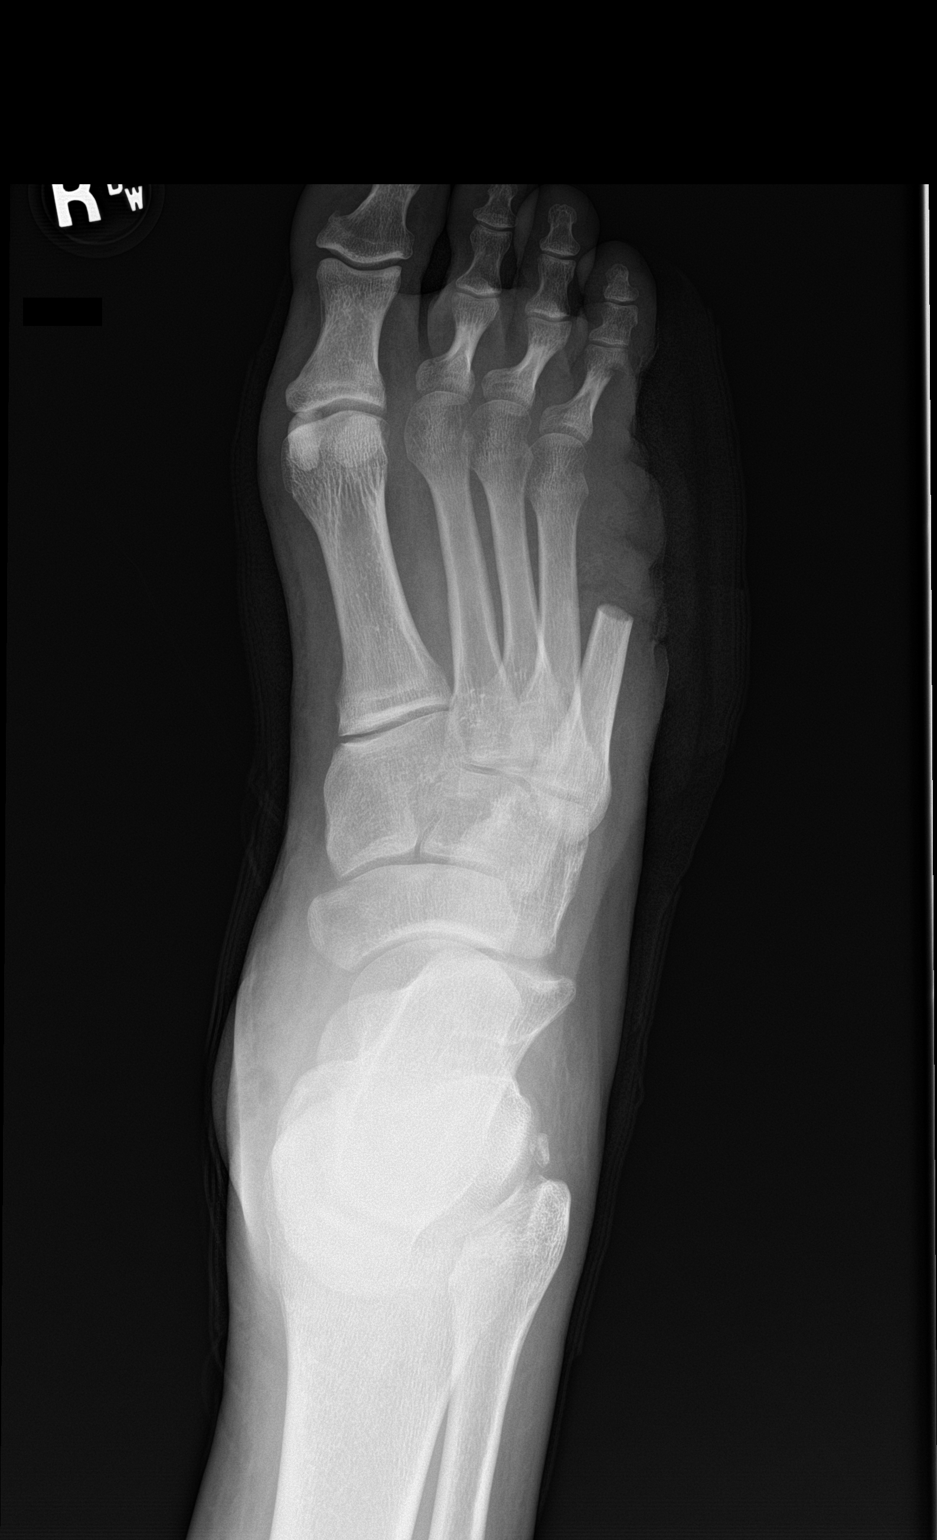

[foot lat]
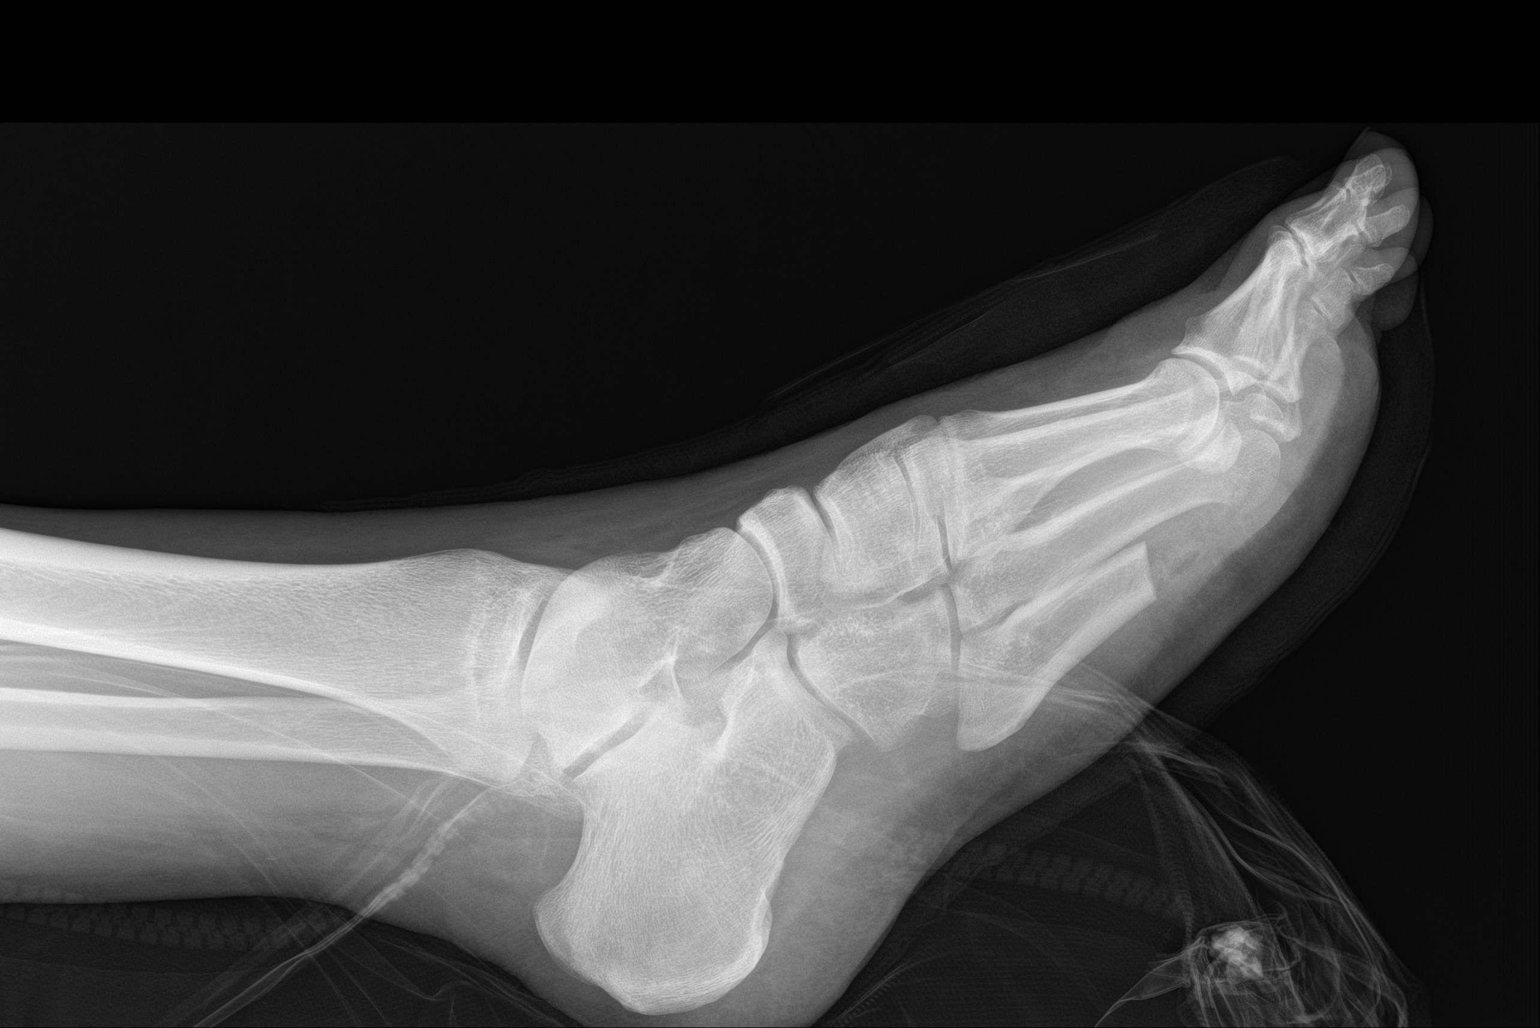

[2 of 2 positions shown; findings below may reference images not displayed]

FINDINGS: The fifth toe and the fifth metatarsal from the midshaft distally
have been resected. The resected end of the remaining fifth
metatarsal is well-defined.

Small focus of lucency with resorption of the overlying cortex,
along the distal, lateral aspect of the proximal phalanx of the
fourth toe. This is not evident on the previous day's study, which
would argue against osteomyelitis. Still, a small focus of
osteomyelitis is possible. There is evidence of a subtle overlying
soft tissue defect. There are no additional areas of bone lucency or
resorption to suggest osteomyelitis. Joints are normally spaced and
aligned.

No soft tissue air.
IMPRESSION: 1. Status post resection of the fifth toe and the middle through
distal aspect of the fifth metatarsal.
2. Small focus of lucency in the distal lateral aspect of the
proximal phalanx of the fourth toe. This could potentially reflect a
small focus osteomyelitis. It may be related to the recent surgery.
Recommend follow-up imaging in 4-6 weeks to reassess.

## 2021-05-21 SURGERY — AMPUTATION, FOOT, RAY
Anesthesia: General | Site: Fifth Toe | Laterality: Right

## 2021-05-21 MED ORDER — LIDOCAINE 2% (20 MG/ML) 5 ML SYRINGE
INTRAMUSCULAR | Status: DC | PRN
  Administered 2021-05-21: 60 mg via INTRAVENOUS

## 2021-05-21 MED ORDER — ORAL CARE MOUTH RINSE: 15.0000 mL | Freq: Once | OROMUCOSAL | Status: AC

## 2021-05-21 MED ORDER — MIDAZOLAM HCL 2 MG/2ML IJ SOLN
INTRAMUSCULAR | Status: DC | PRN
  Administered 2021-05-21: 2 mg via INTRAVENOUS

## 2021-05-21 MED ORDER — PHENOL 1.4 % MT LIQD
1.0000 | OROMUCOSAL | Status: DC | PRN
  Administered 2021-05-21: 1 via OROMUCOSAL
  Filled 2021-05-21: qty 177

## 2021-05-21 MED ORDER — MIDAZOLAM HCL 2 MG/2ML IJ SOLN
INTRAMUSCULAR | Status: AC
  Filled 2021-05-21: qty 2

## 2021-05-21 MED ORDER — GADOBUTROL 1 MMOL/ML IV SOLN
9.0000 mL | Freq: Once | INTRAVENOUS | Status: AC | PRN
  Administered 2021-05-21: 9 mL via INTRAVENOUS

## 2021-05-21 MED ORDER — CHLORHEXIDINE GLUCONATE CLOTH 2 % EX PADS: 6.0000 | MEDICATED_PAD | Freq: Once | CUTANEOUS | Status: DC

## 2021-05-21 MED ORDER — FENTANYL CITRATE (PF) 250 MCG/5ML IJ SOLN
INTRAMUSCULAR | Status: AC
  Filled 2021-05-21: qty 5

## 2021-05-21 MED ORDER — DEXAMETHASONE SODIUM PHOSPHATE 10 MG/ML IJ SOLN
INTRAMUSCULAR | Status: DC | PRN
  Administered 2021-05-21: 5 mg via INTRAVENOUS

## 2021-05-21 MED ORDER — PROPOFOL 10 MG/ML IV BOLUS
INTRAVENOUS | Status: AC
  Filled 2021-05-21: qty 20

## 2021-05-21 MED ORDER — LIDOCAINE HCL 1 % IJ SOLN
INTRAMUSCULAR | Status: DC | PRN
  Administered 2021-05-21: 10 mL via INTRAMUSCULAR

## 2021-05-21 MED ORDER — PHENYLEPHRINE HCL (PRESSORS) 10 MG/ML IV SOLN
INTRAVENOUS | Status: DC | PRN
  Administered 2021-05-21 (×3): 40 ug via INTRAVENOUS

## 2021-05-21 MED ORDER — LIDOCAINE HCL 1 % IJ SOLN
INTRAMUSCULAR | Status: AC
  Filled 2021-05-21: qty 20

## 2021-05-21 MED ORDER — VANCOMYCIN HCL 1500 MG/300ML IV SOLN
1500.0000 mg | Freq: Three times a day (TID) | INTRAVENOUS | Status: DC
  Administered 2021-05-21 – 2021-05-24 (×10): 1500 mg via INTRAVENOUS
  Filled 2021-05-21 (×13): qty 300

## 2021-05-21 MED ORDER — BUPIVACAINE HCL (PF) 0.5 % IJ SOLN
INTRAMUSCULAR | Status: AC
  Filled 2021-05-21: qty 30

## 2021-05-21 MED ORDER — 0.9 % SODIUM CHLORIDE (POUR BTL) OPTIME
TOPICAL | Status: DC | PRN
  Administered 2021-05-21: 1000 mL

## 2021-05-21 MED ORDER — CHLORHEXIDINE GLUCONATE 0.12 % MT SOLN
OROMUCOSAL | Status: AC
  Administered 2021-05-21: 15 mL via OROMUCOSAL
  Filled 2021-05-21: qty 15

## 2021-05-21 MED ORDER — CHLORHEXIDINE GLUCONATE 0.12 % MT SOLN: 15.0000 mL | Freq: Once | OROMUCOSAL | Status: AC

## 2021-05-21 MED ORDER — PROPOFOL 10 MG/ML IV BOLUS
INTRAVENOUS | Status: DC | PRN
  Administered 2021-05-21: 200 mg via INTRAVENOUS

## 2021-05-21 MED ORDER — FENTANYL CITRATE (PF) 250 MCG/5ML IJ SOLN
INTRAMUSCULAR | Status: DC | PRN
  Administered 2021-05-21 (×2): 50 ug via INTRAVENOUS

## 2021-05-21 MED ORDER — ONDANSETRON HCL 4 MG/2ML IJ SOLN
INTRAMUSCULAR | Status: DC | PRN
  Administered 2021-05-21: 4 mg via INTRAVENOUS

## 2021-05-21 MED ORDER — PROPOFOL 1000 MG/100ML IV EMUL
INTRAVENOUS | Status: AC
  Filled 2021-05-21: qty 100

## 2021-05-21 MED ORDER — LACTATED RINGERS IV SOLN: INTRAVENOUS | Status: DC

## 2021-05-21 MED ORDER — SUCCINYLCHOLINE CHLORIDE 200 MG/10ML IV SOSY
PREFILLED_SYRINGE | INTRAVENOUS | Status: DC | PRN
  Administered 2021-05-21: 120 mg via INTRAVENOUS

## 2021-05-21 SURGICAL SUPPLY — 53 items
BANDAGE ESMARK 6X9 LF (GAUZE/BANDAGES/DRESSINGS) IMPLANT
BLADE AVERAGE 25X9 (BLADE) ×2 IMPLANT
BLADE SURG 10 STRL SS (BLADE) ×2 IMPLANT
BNDG CMPR 9X6 STRL LF SNTH (GAUZE/BANDAGES/DRESSINGS)
BNDG COHESIVE 4X5 TAN STRL (GAUZE/BANDAGES/DRESSINGS) ×1 IMPLANT
BNDG ELASTIC 4X5.8 VLCR STR LF (GAUZE/BANDAGES/DRESSINGS) ×2 IMPLANT
BNDG ESMARK 6X9 LF (GAUZE/BANDAGES/DRESSINGS)
COVER WAND RF STERILE (DRAPES) ×2 IMPLANT
CUFF TOURN SGL QUICK 18X4 (TOURNIQUET CUFF) ×1 IMPLANT
CUFF TOURN SGL QUICK 34 (TOURNIQUET CUFF)
CUFF TOURN SGL QUICK 42 (TOURNIQUET CUFF) IMPLANT
CUFF TRNQT CYL 34X4.125X (TOURNIQUET CUFF) IMPLANT
DECANTER SPIKE VIAL GLASS SM (MISCELLANEOUS) ×2 IMPLANT
DRAPE C-ARM MINI (DRAPES) ×1 IMPLANT
DRAPE U-SHAPE 47X51 STRL (DRAPES) ×3 IMPLANT
DRSG TELFA 3X8 NADH (GAUZE/BANDAGES/DRESSINGS) ×2 IMPLANT
DURAPREP 26ML APPLICATOR (WOUND CARE) ×2 IMPLANT
ELECT REM PT RETURN 9FT ADLT (ELECTROSURGICAL) ×2
ELECTRODE REM PT RTRN 9FT ADLT (ELECTROSURGICAL) ×1 IMPLANT
GAUZE PACKING IODOFORM 1/4X15 (PACKING) ×1 IMPLANT
GAUZE SPONGE 4X4 12PLY STRL (GAUZE/BANDAGES/DRESSINGS) ×2 IMPLANT
GAUZE SPONGE 4X4 12PLY STRL LF (GAUZE/BANDAGES/DRESSINGS) ×2 IMPLANT
GAUZE XEROFORM 5X9 LF (GAUZE/BANDAGES/DRESSINGS) ×2 IMPLANT
GLOVE BIO SURGEON STRL SZ8 (GLOVE) ×4 IMPLANT
GOWN STRL REUS W/ TWL LRG LVL3 (GOWN DISPOSABLE) ×1 IMPLANT
GOWN STRL REUS W/ TWL XL LVL3 (GOWN DISPOSABLE) ×1 IMPLANT
GOWN STRL REUS W/TWL LRG LVL3 (GOWN DISPOSABLE) ×2
GOWN STRL REUS W/TWL XL LVL3 (GOWN DISPOSABLE) ×2
KIT BASIN OR (CUSTOM PROCEDURE TRAY) ×2 IMPLANT
KIT TURNOVER KIT B (KITS) ×2 IMPLANT
NDL 25GX 5/8IN NON SAFETY (NEEDLE) ×1 IMPLANT
NEEDLE 25GX 5/8IN NON SAFETY (NEEDLE) ×2 IMPLANT
NS IRRIG 1000ML POUR BTL (IV SOLUTION) ×2 IMPLANT
PACK ORTHO EXTREMITY (CUSTOM PROCEDURE TRAY) ×2 IMPLANT
PAD ABD 8X10 STRL (GAUZE/BANDAGES/DRESSINGS) ×1 IMPLANT
PAD ARMBOARD 7.5X6 YLW CONV (MISCELLANEOUS) ×4 IMPLANT
PAD CAST 4YDX4 CTTN HI CHSV (CAST SUPPLIES) ×1 IMPLANT
PAD DRESSING TELFA 3X8 NADH (GAUZE/BANDAGES/DRESSINGS) IMPLANT
PADDING CAST COTTON 4X4 STRL (CAST SUPPLIES) ×2
SPONGE LAP 18X18 RF (DISPOSABLE) ×4 IMPLANT
SPONGE SURGIFOAM ABS GEL 12-7 (HEMOSTASIS) ×1 IMPLANT
STAPLER VISISTAT 35W (STAPLE) ×2 IMPLANT
STOCKINETTE IMPERVIOUS LG (DRAPES) IMPLANT
SUT ETHILON 2 0 PSLX (SUTURE) IMPLANT
SUT PROLENE 3 0 PS 2 (SUTURE) ×4 IMPLANT
SWAB COLLECTION DEVICE MRSA (MISCELLANEOUS) IMPLANT
SWAB CULTURE ESWAB REG 1ML (MISCELLANEOUS) ×1 IMPLANT
SYR CONTROL 10ML LL (SYRINGE) ×2 IMPLANT
TOWEL GREEN STERILE (TOWEL DISPOSABLE) ×2 IMPLANT
TOWEL GREEN STERILE FF (TOWEL DISPOSABLE) ×2 IMPLANT
TUBE CONNECTING 12X1/4 (SUCTIONS) ×2 IMPLANT
WATER STERILE IRR 1000ML POUR (IV SOLUTION) ×2 IMPLANT
YANKAUER SUCT BULB TIP NO VENT (SUCTIONS) ×2 IMPLANT

## 2021-05-21 NOTE — Anesthesia Procedure Notes (Signed)
Procedure Name: Intubation Date/Time: 05/21/2021 2:52 PM Performed by: Clearnce Sorrel, CRNA Pre-anesthesia Checklist: Patient identified, Emergency Drugs available, Suction available, Patient being monitored and Timeout performed Patient Re-evaluated:Patient Re-evaluated prior to induction Oxygen Delivery Method: Circle system utilized Preoxygenation: Pre-oxygenation with 100% oxygen Induction Type: IV induction, Rapid sequence and Cricoid Pressure applied Laryngoscope Size: Mac and 4 Grade View: Grade I Tube type: Oral Tube size: 7.5 mm Number of attempts: 1 Airway Equipment and Method: Stylet Placement Confirmation: ETT inserted through vocal cords under direct vision,  positive ETCO2 and breath sounds checked- equal and bilateral Secured at: 24 cm Tube secured with: Tape Dental Injury: Teeth and Oropharynx as per pre-operative assessment

## 2021-05-21 NOTE — Progress Notes (Addendum)
Subjective: 25 year old male with past medical history significant for diabetes, hypertension, neuropathy with mild arterial insufficiency (previously been evaluated by vascular surgery in 01/2021) presented with worsening infection, drainage of the right foot as well as increased redness.  He was admitted and started on broad-spectrum antibiotics.  I saw him yesterday MRI was ordered which is not yet complete.  He  states he feels well other than not sleeping last night.  Denies any fevers or chills.  No nausea or vomiting.  Of note he is concerned about his blood pressure and heart rate.  He states that it has been elevated for the last 2 weeks. He thinks he had a panic attack about 2 weeks ago and since then has had increased heart rate, blood pressure.  Objective: AAO x3, NAD DP/PT pulses palpable bilaterally, CRT less than 3 seconds Dressing changed on the right foot.  Ulceration which is full-thickness and probing to bone still remains on the right fifth MPJ.  Does probe approximately 2 cm plantarly there is purulence identified from the plantar aspect.  The erythema and edema has improved compared to last night.  Ulceration of the fourth toe noted with maceration along the interspace.  No pain with calf compression, swelling, warmth, erythema  Assessment: Osteomyelitis, abscess right foot  Plan: No blood work from this morning in the MRI is not yet complete.  He is still NPO  for surgery potentially today however we will likely plan on surgery tomorrow including a partial fifth ray amputation.  It appears that the cellulitis is is improving and he is stable.  I would like to get updated blood work as well as MRI before proceeding with surgery to determine the extent of the infection.  I discussed with him partial fifth amputation tomorrow and likely have to return to the OR 48 to 72 hours afterwards for further debridement, possible closure of the wound.  For now continue IV antibiotics and  elevation. Today I irrigated the wound with saline and packed the wound with saline moistened gauze and wrapped with 4x4, Kerlix.   Celesta Gentile, DPM  Addendum: 7:39am * I called MRI was told that there are number of ER patients needing MRI and another tech will come in today. I discussed that the surgery is pending MRI and it will be done today. Will likely plan for surgery tomorrow as his is stable.

## 2021-05-21 NOTE — Op Note (Signed)
PATIENT:  [name]  25 y.o. male  PRE-OPERATIVE DIAGNOSIS:  Osteomyelitis Right Foot  POST-OPERATIVE DIAGNOSIS:  Osteomyelitis Right Foot  PROCEDURE:  Procedure(s): AMPUTATION RAY 5th (Right)  SURGEON:  Surgeon(s) and Role:    Trula Slade, DPM - Primary  PHYSICIAN ASSISTANT:   ASSISTANTS: none   ANESTHESIA:   general  EBL:  5 mL   BLOOD ADMINISTERED:none  DRAINS: none   LOCAL MEDICATIONS USED:  Amount: 10 ml lidocaine and marcaine plain mix  SPECIMEN:  Source of Specimen:  bone culture right 4th, wound culture right 5th, bone pathology right 5th ray  DISPOSITION OF SPECIMEN:  PATHOLOGY  COUNTS:  YES  TOURNIQUET:  * Missing tourniquet times found for documented tourniquets in log: [id number] *  DICTATION: .Dragon Dictation  PLAN OF CARE: Admit to inpatient   PATIENT DISPOSITION:  PACU - hemodynamically stable.   Delay start of Pharmacological VTE agent (>24hrs) due to surgical blood loss or risk of bleeding: no  Indications for surgery: 25 year old male was admitted to the hospital for worsening infection of his right foot.  X-rays were concern for osteomyelitis and MRI was performed.  Upon admission was started on broad-spectrum antibiotics.  He has had a wound for the last couple months to the lateral aspect the right foot.  This started after he had apparent skin breakdown to his ankle and while wrapping that he noticed a blister to this area and since then has progressed.  Has been treated with any pain wound care center.  I reviewed the x-ray as well as MRI findings with him and we discussed limb salvage versus amputation.  Given the infection recommended partial fifth ray amputation with bone biopsy of the fourth digit.  I discussed with him will likely need to have serial debridement and return to the operating room possibly later this week for further washout, definitive closure and need to monitor the fourth toe to see if there is any bacteria.  I  discussed the surgical's postoperative course.  Discussed alternatives risks and complications.  No promises or guarantees given.  All questions answered the best my ability.  I also discussed with the patient's mom via phone on several occasions today.  Procedure in detail: The patient was both verbally and visually identified by myself, nursing staff, the anesthesia staff preoperatively.  He was transferred to the operating room via stretcher and placed on the operating table in supine position.  After LMA was placed a well-padded calf tourniquet was applied but of note it was not inflated during the procedure.  The right lower extremities and scrubbed, prepped, draped in normal sterile fashion.  Timeout was performed.  At this time a modified racquet shaped incision was planned on the fifth metatarsal extending around the fifth MPJ and the incision was also made in order to excise the ulceration.  There was found to be purulence coming from the ulceration and probing directly down to the fifth MPJ.  The incision was made from skin to bone with a #15 with scalpel.  The toe was then disarticulated at the level of the MPJ and passed off the table.  Soft tissue structures were freed from the fifth metatarsal.  A sagittal bone saw was utilized to resect the fifth metatarsal.  The metatarsal and toe was sent to pathology.  A wound culture was obtained of the purulence.  The incision was copiously irrigated with saline hemostasis was achieved.  The wound was loosely closed with 3-0 Prolene and  I utilized Gelfoam in order to help aid in hemostasis as well.  Incision was then directed to the fourth toe which a small stab incision was made along the proximal phalanx.  At this time a Jamshidi needle was utilized to remove a piece of bone and this was sent for culture to see if there is osteomyelitis.  Incision was then irrigated and a single Prolene was then placed.  Iodoform packing was then placed into the wound  for the partial fifth ray amputation.  Xeroform was applied to both incisions followed by 4 x 4's, Kerlix, ABD pads, Ace bandage.  He was awoken from anesthesia and found to tolerate the procedure well and complications.  He was transferred to PACU vital sign stable and vascular status intact.  Postoperative course: Patient remained inpatient IV antibiotics.  Await bone culture.  Likely turn to the OR on Tuesday or Wednesday for repeat debridement and hopeful closure of the wound.  He is nonweightbearing.  Elevation.

## 2021-05-21 NOTE — Plan of Care (Signed)
  Problem: Activity: Goal: Risk for activity intolerance will decrease Outcome: Progressing   Problem: Nutrition: Goal: Adequate nutrition will be maintained Outcome: Progressing   Problem: Coping: Goal: Level of anxiety will decrease Outcome: Progressing   Problem: Pain Managment: Goal: General experience of comfort will improve Outcome: Progressing   Problem: Safety: Goal: Ability to remain free from injury will improve Outcome: Progressing   

## 2021-05-21 NOTE — Progress Notes (Signed)
Patient refused pain meds at this time stating he needed his hydroyxine more. He stated his pain was a little better and he wanted to wait on taking any pain meds for now. RN will continue to monitor.

## 2021-05-21 NOTE — Anesthesia Preprocedure Evaluation (Addendum)
Anesthesia Evaluation  Patient identified by MRN, date of birth, ID band Patient awake    Reviewed: Allergy & Precautions, NPO status , Patient's Chart, lab work & pertinent test results, reviewed documented beta blocker date and time   History of Anesthesia Complications Negative for: history of anesthetic complications  Airway Mallampati: II  TM Distance: >3 FB Neck ROM: Full    Dental  (+) Dental Advisory Given, Poor Dentition, Chipped, Missing   Pulmonary neg pulmonary ROS,    Pulmonary exam normal        Cardiovascular hypertension, Pt. on home beta blockers and Pt. on medications Normal cardiovascular exam   '21 TTE - There is mild chordal SAM in the setting of hyperdynamic LV with mild subvalvular gradient of 17 mmHg. No significant LV hypertrophy. EF is 65 to 70%.    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   Renal/GU negative Renal ROS     Musculoskeletal negative musculoskeletal ROS (+)   Abdominal   Peds  Hematology negative hematology ROS (+)   Anesthesia Other Findings   Reproductive/Obstetrics                            Anesthesia Physical Anesthesia Plan  ASA: III  Anesthesia Plan: General   Post-op Pain Management:    Induction: Intravenous, Rapid sequence and Cricoid pressure planned  PONV Risk Score and Plan: 2 and Treatment may vary due to age or medical condition, Ondansetron, Midazolam and Diphenhydramine  Airway Management Planned: Oral ETT  Additional Equipment: None  Intra-op Plan:   Post-operative Plan: Extubation in OR  Informed Consent: I have reviewed the patients History and Physical, chart, labs and discussed the procedure including the risks, benefits and alternatives for the proposed  anesthesia with the patient or authorized representative who has indicated his/her understanding and acceptance.     Dental advisory given  Plan Discussed with: CRNA and Anesthesiologist  Anesthesia Plan Comments:        Anesthesia Quick Evaluation

## 2021-05-21 NOTE — Transfer of Care (Signed)
Immediate Anesthesia Transfer of Care Note  Patient: [name]  Procedure(s) Performed: AMPUTATION RAY 5th (Right Fifth Toe)  Patient Location: PACU  Anesthesia Type:General  Level of Consciousness: drowsy  Airway & Oxygen Therapy: Patient Spontanous Breathing  Post-op Assessment: Report given to RN and Post -op Vital signs reviewed and stable  Post vital signs: Reviewed and stable  Last Vitals:  Vitals Value Taken Time  BP 87/77 05/21/21 1549  Temp    Pulse 100 05/21/21 1550  Resp 23 05/21/21 1550  SpO2 97 % 05/21/21 1550  Vitals shown include unvalidated device data.  Last Pain:  Vitals:   05/21/21 1421  TempSrc:   PainSc: 0-No pain      Patients Stated Pain Goal: 3 (33/58/25 1898)  Complications: No complications documented.

## 2021-05-21 NOTE — Progress Notes (Signed)
Pharmacy Antibiotic Note  [name] is a 25 y.o. male admitted on 05/20/2021 with R foot osteomyelitis. Pharmacy has been consulted for Vancomycin dosing.  Patient is afebrile, WBC elevated 18.5. Renal function stable. Noted plan for partial 5th ray amputation on 5/30.   Plan: Increase vancomycin to 1500 mg IV q8h Continue CTX 2 gm IV q24h Monitor renal function, cxs, clinical improvement F/u post-op abx plan  Height: 6\' 3"  (190.5 cm) Weight: 97.5 kg (214 lb 15.9 oz) IBW/kg (Calculated) : 84.5  Temp (24hrs), Avg:98.6 F (37 C), Min:97.7 F (36.5 C), Max:99.6 F (37.6 C)  Recent Labs  Lab 05/20/21 1011 05/20/21 1324  WBC 18.5*  --   CREATININE 0.72  --   LATICACIDVEN 1.3 1.5    Estimated Creatinine Clearance: 171.6 mL/min (by C-G formula based on SCr of 0.72 mg/dL).    Allergies  Allergen Reactions  . Amoxicillin Hives    Has patient had a PCN reaction causing immediate rash, facial/tongue/throat swelling, SOB or lightheadedness with hypotension: No Has patient had a PCN reaction causing severe rash involving mucus membranes or skin necrosis: No Has patient had a PCN reaction that required hospitalization: No Has patient had a PCN reaction occurring within the last 10 years: No If all of the above answers are "NO", then may proceed with Cephalosporin use.     Antimicrobials this admission: Vancomcyin 5/28>>  Ceftriaxone 5/28 >>   Microbiology results: 5/28 BCx: pending 5/28 MRSA PCR: negative  Thank you for allowing pharmacy to be a part of this patient's care.  Berenice Bouton, PharmD, MS, BCPS PGY2 Pharmacy Resident Phone between 7 am - 3:30 pm: [phone]  Please check AMION for all [place] phone numbers After 10:00 PM, call [place] [phone]  05/21/2021 10:24 AM

## 2021-05-21 NOTE — Anesthesia Postprocedure Evaluation (Signed)
Anesthesia Post Note  Patient: [name]  Procedure(s) Performed: AMPUTATION RAY 5th (Right Fifth Toe)     Patient location during evaluation: PACU Anesthesia Type: General Level of consciousness: awake and alert Pain management: pain level controlled Vital Signs Assessment: post-procedure vital signs reviewed and stable Respiratory status: spontaneous breathing, nonlabored ventilation and respiratory function stable Cardiovascular status: blood pressure returned to baseline and stable Postop Assessment: no apparent nausea or vomiting Anesthetic complications: no   No complications documented.  Last Vitals:  Vitals:   05/21/21 1620 05/21/21 1651  BP: 113/70 115/66  Pulse: 97 98  Resp: 16 18  Temp:  36.7 C  SpO2: 96% 97%    Last Pain:  Vitals:   05/21/21 1651  TempSrc: Oral  PainSc:                  Audry Pili

## 2021-05-21 NOTE — Progress Notes (Signed)
Inpatient Diabetes Program Recommendations  AACE/ADA: New Consensus Statement on Inpatient Glycemic Control (2015)  Target Ranges:  Prepandial:   less than 140 mg/dL      Peak postprandial:   less than 180 mg/dL (1-2 hours)      Critically ill patients:  140 - 180 mg/dL   Lab Results  Component Value Date   GLUCAP 178 (H) 05/21/2021   HGBA1C 11.4 (A) 10/18/2020    Review of Glycemic Control  Diabetes history: DM1 Outpatient Diabetes medications: Lantus 35 units BID, Humalog 15 units TID Current orders for Inpatient glycemic control: Lantus 20 units BID, Novolog 0-15 units TID with meals and 0-5 HS  HgbA1C pending CBGs 229, 205, 168, 178 mg/dL Received Decadron 5 mg in surgery  Inpatient Diabetes Program Recommendations:     Decrease Novolog to 0-9 units TID with meals and 0-5 HS Add Novolog 6 units TID with meals if eating > 50% meal  Awaiting HgbA1C results.  Thank you. Lorenda Peck, RD, LDN, CDE Inpatient Diabetes Coordinator [phone]

## 2021-05-21 NOTE — Progress Notes (Signed)
Patient seen in pre-op. I have called and spoken with the patient and his mom on several occasions today after the MRI report came in. I have recommended to go ahead and proceed with partial 5th ray amputation and bone biopsy of the 4th toe. Discussed with him and his mom (via phone) that this will be a staged procedure due to the infection. After many discussions everyone is in agreement to proceed with surgery today. Will proceed as planned including partial 5th ray amputation and bone biopsy of the 4th toe. We discussed risks, complications of the surgery including spread of infection, further amputation, delayed/nonhealing as well as general risks of surgery. Consent signed. Verified NPO.   Will likely return to the OR in 48-72 hours for further debridement and possible amputation of the 4th toe.   Celesta Gentile, DPM

## 2021-05-21 NOTE — Progress Notes (Signed)
PROGRESS NOTE    [name]  ZOX:[id number] DOB: [birth year] DOA: 05/20/2021 PCP: [name], MD  Outpatient Specialists:   Brief Narrative:  As per H&P done: "[name] is a 25 y.o. male with medical history significant of hypertension, diabetes, hyperlipidemia, mild LE arterial insufficiency (normal ankle/arm index on previous arterial dopplers), neuropathy and chronic right foot wound who presents to the emergency department secondary to increased drainage, swelling and redness on the lateral aspect of his right foot.  Patient reports the symptoms has been present for the last 2 to 3 days and progressing.  Due to underlying history of neuropathy he does not express significant pain.  Patient denies chest pain, nausea, vomiting, fever, focal weakness, dysuria, hematuria, melena or hematochezia.  No COVID immunization reports.  COVID PCR negative in the ED  ED Course: Found with worsening right foot wound; elevated WBCs and chest x-ray positive for osteomyelitis.  Case discussed with vascular surgery and podiatry's recommend the patient transferred to [facility] for further evaluation and management.  Patient is at this moment facing the need of surgical intervention/amputation.  Cultures taken and per diabetic foot ulcer broad-spectrum antibiotics initiated (patient allergic to amoxicillin) ".  05/21/2021: Patient seen.  Patient has undergone ray amputation of the right fifth toe.  Podiatry team plans for further debridement turning Tuesday or Wednesday and, hopefully, closure of the wound.    Assessment & Plan:   Principal Problem:   Acute osteomyelitis of metatarsal bone of right foot (HCC) Active Problems:   HTN (hypertension)   Uncontrolled type 1 diabetes mellitus with hyperglycemia (HCC)   Gastroesophageal reflux disease   HLD (hyperlipidemia)  Acute osteomyelitis of metatarsal bone of right foot: -Patient has undergone ray amputation of the  fifth right toe. -Further surgery is planned for next week. -Continue antibiotics. -Further management as per podiatry.    Hypertension:  -Patient is currently on Coreg 25 Mg p.o. twice daily and isosorbide mononitrate 30 Mg p.o. once daily. -Goal blood pressure should be less than 130/80 mmHg. -Continue to monitor closely.    Uncontrolled type 1 diabetes mellitus with hyperglycemia: -Follow hemoglobin A1c. -Started on sliding scale insulin and Lantus.  Gastroesophageal reflux disease: -Continue PPI  Hyperlipidemia: -Continue statins.  DVT prophylaxis: Subcutaneous Lovenox Code Status: Full code Family Communication:  Disposition Plan: Likely home eventual   Consultants:   Podiatry   Procedures:   Ray amputation of the fifth right toe.  Antimicrobials:   IV Rocephin IV vancomycin    Subjective: No new complaints. No fever or chills. No chest pain or shortness of breath  Objective: Vitals:   05/20/21 1523 05/20/21 2039 05/21/21 0347 05/21/21 0806  BP: 124/83 133/87 (!) 139/91 137/88  Pulse: (!) 103 (!) 110 100 (!) 105  Resp: 16 16 17 17   Temp: 97.7 F (36.5 C) 98.1 F (36.7 C) 99.6 F (37.6 C) 98.2 F (36.8 C)  TempSrc: Oral Oral Oral Oral  SpO2: 99% 95% 99% 99%  Weight: 97.5 kg     Height: 6\' 3"  (1.905 m)       Intake/Output Summary (Last 24 hours) at 05/21/2021 1013 Last data filed at 05/21/2021 0000 Gross per 24 hour  Intake 133.15 ml  Output 925 ml  Net -791.85 ml   Filed Weights   05/20/21 0907 05/20/21 1523  Weight: 97.5 kg 97.5 kg    Examination:  General exam: Appears calm and comfortable  Respiratory system: Clear to auscultation.  Cardiovascular system: S1 & S2  heard Gastrointestinal system: Abdomen is nondistended, soft and nontender. No organomegaly or masses felt. Normal bowel sounds heard. Central nervous system: Alert and oriented. No focal neurological deficits. Extremities: Right foot is wrapped.  Data Reviewed: I  have personally reviewed following labs and imaging studies  CBC: Recent Labs  Lab 05/20/21 1011  WBC 18.5*  NEUTROABS 14.7*  HGB 14.0  HCT 42.0  MCV 83.7  PLT 426   Basic Metabolic Panel: Recent Labs  Lab 05/20/21 1011  NA 130*  K 4.1  CL 95*  CO2 25  GLUCOSE 396*  BUN 11  CREATININE 0.72  CALCIUM 9.7   GFR: Estimated Creatinine Clearance: 171.6 mL/min (by C-G formula based on SCr of 0.72 mg/dL). Liver Function Tests: No results for input(s): AST, ALT, ALKPHOS, BILITOT, PROT, ALBUMIN in the last 168 hours. No results for input(s): LIPASE, AMYLASE in the last 168 hours. No results for input(s): AMMONIA in the last 168 hours. Coagulation Profile: No results for input(s): INR, PROTIME in the last 168 hours. Cardiac Enzymes: No results for input(s): CKTOTAL, CKMB, CKMBINDEX, TROPONINI in the last 168 hours. BNP (last 3 results) No results for input(s): PROBNP in the last 8760 hours. HbA1C: No results for input(s): HGBA1C in the last 72 hours. CBG: Recent Labs  Lab 05/20/21 1407 05/20/21 2058 05/21/21 0628  GLUCAP 275* 254* 229*   Lipid Profile: No results for input(s): CHOL, HDL, LDLCALC, TRIG, CHOLHDL, LDLDIRECT in the last 72 hours. Thyroid Function Tests: No results for input(s): TSH, T4TOTAL, FREET4, T3FREE, THYROIDAB in the last 72 hours. Anemia Panel: No results for input(s): VITAMINB12, FOLATE, FERRITIN, TIBC, IRON, RETICCTPCT in the last 72 hours. Urine analysis:    Component Value Date/Time   COLORURINE STRAW (A) 08/10/2018 1425   APPEARANCEUR CLEAR 08/10/2018 1425   LABSPEC 1.035 (H) 08/10/2018 1425   PHURINE 7.0 08/10/2018 1425   GLUCOSEU >=500 (A) 08/10/2018 1425   HGBUR NEGATIVE 08/10/2018 1425   BILIRUBINUR NEGATIVE 08/10/2018 1425   KETONESUR 5 (A) 08/10/2018 1425   PROTEINUR NEGATIVE 08/10/2018 1425   NITRITE NEGATIVE 08/10/2018 1425   LEUKOCYTESUR NEGATIVE 08/10/2018 1425   Sepsis  Labs: @LABRCNTIP (procalcitonin:4,lacticidven:4)  ) Recent Results (from the past 240 hour(s))  Resp Panel by RT-PCR (Flu A&B, Covid) Nasopharyngeal Swab     Status: None   Collection Time: 05/20/21 12:46 PM   Specimen: Nasopharyngeal Swab; Nasopharyngeal(NP) swabs in vial transport medium  Result Value Ref Range Status   SARS Coronavirus 2 by RT PCR NEGATIVE NEGATIVE Final    Comment: (NOTE) SARS-CoV-2 target nucleic acids are NOT DETECTED.  The SARS-CoV-2 RNA is generally detectable in upper respiratory specimens during the acute phase of infection. The lowest concentration of SARS-CoV-2 viral copies this assay can detect is 138 copies/mL. A negative result does not preclude SARS-Cov-2 infection and should not be used as the sole basis for treatment or other patient management decisions. A negative result may occur with  improper specimen collection/handling, submission of specimen other than nasopharyngeal swab, presence of viral mutation(s) within the areas targeted by this assay, and inadequate number of viral copies(<138 copies/mL). A negative result must be combined with clinical observations, patient history, and epidemiological information. The expected result is Negative.  Fact Sheet for Patients:  EntrepreneurPulse.com.au  Fact Sheet for Healthcare Providers:  IncredibleEmployment.be  This test is no t yet approved or cleared by the [place] FDA and  has been authorized for detection and/or diagnosis of SARS-CoV-2 by FDA under an Emergency Use Authorization (EUA). This  EUA will remain  in effect (meaning this test can be used) for the duration of the COVID-19 declaration under Section 564(b)(1) of the Act, 21 U.S.C.section 360bbb-3(b)(1), unless the authorization is terminated  or revoked sooner.       Influenza A by PCR NEGATIVE NEGATIVE Final   Influenza B by PCR NEGATIVE NEGATIVE Final    Comment: (NOTE) The Xpert  Xpress SARS-CoV-2/FLU/RSV plus assay is intended as an aid in the diagnosis of influenza from Nasopharyngeal swab specimens and should not be used as a sole basis for treatment. Nasal washings and aspirates are unacceptable for Xpert Xpress SARS-CoV-2/FLU/RSV testing.  Fact Sheet for Patients: EntrepreneurPulse.com.au  Fact Sheet for Healthcare Providers: IncredibleEmployment.be  This test is not yet approved or cleared by the [place] FDA and has been authorized for detection and/or diagnosis of SARS-CoV-2 by FDA under an Emergency Use Authorization (EUA). This EUA will remain in effect (meaning this test can be used) for the duration of the COVID-19 declaration under Section 564(b)(1) of the Act, 21 U.S.C. section 360bbb-3(b)(1), unless the authorization is terminated or revoked.  Performed at [facility], [address] [place], [place] [address]   Culture, blood (routine x 2)     Status: None (Preliminary result)   Collection Time: 05/20/21  1:24 PM   Specimen: BLOOD LEFT ARM  Result Value Ref Range Status   Specimen Description BLOOD LEFT ARM BOTTLES DRAWN AEROBIC AND ANAEROBIC  Final   Special Requests   Final    Blood Culture adequate volume Performed at [facility], [address], [place], [place] [address]    Culture PENDING  Incomplete   Report Status PENDING  Incomplete  Culture, blood (routine x 2)     Status: None (Preliminary result)   Collection Time: 05/20/21  1:24 PM   Specimen: BLOOD LEFT HAND  Result Value Ref Range Status   Specimen Description   Final    BLOOD LEFT HAND BOTTLES DRAWN AEROBIC AND ANAEROBIC   Special Requests   Final    Blood Culture adequate volume Performed at [facility], [address], [place], [place] [address]    Culture PENDING  Incomplete   Report Status PENDING  Incomplete  Surgical pcr screen     Status: None   Collection Time: 05/20/21  8:29 PM   Specimen: Nasal Mucosa; Nasal Swab   Result Value Ref Range Status   MRSA, PCR NEGATIVE NEGATIVE Final   Staphylococcus aureus NEGATIVE NEGATIVE Final    Comment: (NOTE) The Xpert SA Assay (FDA approved for NASAL specimens in patients 41 years of age and older), is one component of a comprehensive surveillance program. It is not intended to diagnose infection nor to guide or monitor treatment. Performed at [facility], [place] [address] [place], [place] [address]          Radiology Studies: DG Foot Complete Right  Result Date: 05/20/2021 CLINICAL DATA:  Wound to distal 5th metatarsal EXAM: RIGHT FOOT COMPLETE - 3+ VIEW COMPARISON:  12/21/2020 FINDINGS: Soft tissue wound/ulceration adjacent to the distal 5th metatarsal head. Adjacent cortical irregularity/destruction involving the 5th metatarsal head and likely the lateral base of the 5th proximal phalanx, suggesting osteomyelitis/septic joint. Mild lateral soft tissue swelling involving the 5th digit. IMPRESSION: Osteomyelitis/septic joint involving the 5th metatarsal head and base of the 5th proximal phalanx. Electronically Signed   By: Julian Hy M.D.   On: 05/20/2021 10:40        Scheduled Meds: . aspirin EC  81 mg Oral Daily  . atorvastatin  10 mg Oral q1800  . carvedilol  25 mg Oral BID  . enoxaparin (LOVENOX) injection  40 mg Subcutaneous Q24H  . insulin aspart  0-15 Units Subcutaneous TID WC  . insulin aspart  0-5 Units Subcutaneous QHS  . insulin glargine  20 Units Subcutaneous BID  . isosorbide mononitrate  30 mg Oral Daily  . pantoprazole  40 mg Oral Daily   Continuous Infusions: . cefTRIAXone (ROCEPHIN)  IV    . vancomycin 1,000 mg (05/21/21 0605)     LOS: 1 day    Time spent: 35 minutes    Dana Allan, MD  [facility] Pager #: [phone] 7PM-7AM contact night coverage as above

## 2021-05-22 ENCOUNTER — Encounter (HOSPITAL_COMMUNITY): Payer: Self-pay | Admitting: Podiatry

## 2021-05-22 LAB — GLUCOSE, CAPILLARY
Glucose-Capillary: 216 mg/dL — ABNORMAL HIGH (ref 70–99)
Glucose-Capillary: 200 mg/dL — ABNORMAL HIGH (ref 70–99)
Glucose-Capillary: 113 mg/dL — ABNORMAL HIGH (ref 70–99)
Glucose-Capillary: 174 mg/dL — ABNORMAL HIGH (ref 70–99)

## 2021-05-22 LAB — CREATININE, SERUM
Creatinine, Ser: 0.66 mg/dL (ref 0.61–1.24)
GFR, Estimated: >60 mL/min (ref >60)

## 2021-05-22 MED ORDER — JUVEN PO PACK
1.0000 | PACK | Freq: Two times a day (BID) | ORAL | Status: DC
  Administered 2021-05-22 – 2021-05-26 (×7): 1 via ORAL
  Filled 2021-05-22 (×7): qty 1

## 2021-05-22 MED ORDER — GLUCERNA SHAKE PO LIQD
237.0000 mL | Freq: Three times a day (TID) | ORAL | Status: DC
  Administered 2021-05-22 – 2021-05-26 (×11): 237 mL via ORAL

## 2021-05-22 NOTE — Progress Notes (Signed)
Initial Nutrition Assessment  DOCUMENTATION CODES:   Non-severe (moderate) malnutrition in context of chronic illness  INTERVENTION:   Encourage PO intake  Glucerna Shake po TID, each supplement provides 220 kcal and 10 grams of protein  -1 packet Juven BID, each packet provides 95 calories, 2.5 grams of protein (collagen), and 9.8 grams of carbohydrate (3 grams sugar); also contains 7 grams of L-arginine and L-glutamine, 300 mg vitamin C, 15 mg vitamin E, 1.2 mcg vitamin B-12, 9.5 mg zinc, 200 mg calcium, and 1.5 g  Calcium Beta-hydroxy-Beta-methylbutyrate to support wound healing  Reviewed DM diet education and medication compliance  NUTRITION DIAGNOSIS:   Moderate Malnutrition related to chronic illness (DM/HTN) as evidenced by mild muscle depletion,energy intake < 75% for > or equal to 1 month.  GOAL:   Patient will meet greater than or equal to 90% of their needs  MONITOR:   PO intake,Supplement acceptance  REASON FOR ASSESSMENT:   Consult Wound healing  ASSESSMENT:   Pt with PMH of HTN, uncontrolled DM, HLD, mild LE arterial insufficiency, neuropathy and chronic R foot wound who is now admitted with osteomyelitis.   Per podiatry plan for further surgery later this week for possible debridement and closure.  Per pt he has not had a great appetite since hurting his knee Nov 2021. He reports that he cannot have surgery due to elevated A1C. He eats one meal per day usually supper. He is alone for breakfast and lunch and when his mom comes home from work at 6 she will make dinner. He eats a smaller portion than usual <50% of his meal.  Per chart review pt was 224 lb 1/22, 3% weight loss x 4 months.  Per pt he has very difficult to control DM. He reports that he has been on ozempic for a few months and recently increased the dose and this has seemed to help his blood sugar most, of note this can contribute to weight loss.   5/29 s/p amputation of 5th ray of R foot    Medications reviewed and include: SSI TID with meals and at bedtime, 20 units lantus BID, protonix Labs reviewed:  CBG's: 178-216 Prealbumin: 13.9 CRP: 30.7 A1C (2021): 11.4    NUTRITION - FOCUSED PHYSICAL EXAM:  Flowsheet Row Most Recent Value  Orbital Region No depletion  Upper Arm Region Mild depletion  Thoracic and Lumbar Region No depletion  Buccal Region No depletion  Temple Region Mild depletion  Clavicle Bone Region Mild depletion  Clavicle and Acromion Bone Region Mild depletion  Scapular Bone Region No depletion  Dorsal Hand Moderate depletion  Patellar Region No depletion  Anterior Thigh Region No depletion  Posterior Calf Region Unable to assess  Edema (RD Assessment) None  Hair Reviewed  Eyes Reviewed  Mouth Reviewed  [poor dentition]  Skin Reviewed  Nails Reviewed       Diet Order:   Diet Order            Diet heart healthy/carb modified Room service appropriate? Yes with Assist; Fluid consistency: Thin  Diet effective now                 EDUCATION NEEDS:   Education needs have been addressed  Skin:  Skin Assessment: Skin Integrity Issues: Skin Integrity Issues:: Diabetic Ulcer,Other (Comment) Diabetic Ulcer: R ankle Other: open wounds: R heel, R medial and lateral pinky toe, R lateral 4th toe, R lateral foot @ MTP  Last BM:  5/27  Height:   Ht Readings  from Last 1 Encounters:  05/21/21 6\' 3"  (1.905 m)    Weight:   Wt Readings from Last 1 Encounters:  05/21/21 97.5 kg    Ideal Body Weight:  89 kg  BMI:  Body mass index is 26.87 kg/m.  Estimated Nutritional Needs:   Kcal:  2400-2600  Protein:  125-140 grams  Fluid:  > 2 L/day  Lockie Pares., RD, LDN, CNSC See AMiON for contact information

## 2021-05-22 NOTE — Plan of Care (Signed)
  Problem: Activity: Goal: Risk for activity intolerance will decrease Outcome: Progressing   Problem: Coping: Goal: Level of anxiety will decrease Outcome: Progressing   Problem: Pain Managment: Goal: General experience of comfort will improve Outcome: Progressing   Problem: Safety: Goal: Ability to remain free from injury will improve Outcome: Progressing   Problem: Skin Integrity: Goal: Risk for impaired skin integrity will decrease Outcome: Progressing   

## 2021-05-22 NOTE — Progress Notes (Signed)
PROGRESS NOTE    [name]  TZG:[id number] DOB: [birth year] DOA: 05/20/2021 PCP: [name], MD  Outpatient Specialists:   Brief Narrative:  As per H&P done: "[name] is a 25 y.o. male with medical history significant of hypertension, diabetes, hyperlipidemia, mild LE arterial insufficiency (normal ankle/arm index on previous arterial dopplers), neuropathy and chronic right foot wound who presents to the emergency department secondary to increased drainage, swelling and redness on the lateral aspect of his right foot.  Patient reports the symptoms has been present for the last 2 to 3 days and progressing.  Due to underlying history of neuropathy he does not express significant pain.  Patient denies chest pain, nausea, vomiting, fever, focal weakness, dysuria, hematuria, melena or hematochezia.  No COVID immunization reports.  COVID PCR negative in the ED  ED Course: Found with worsening right foot wound; elevated WBCs and chest x-ray positive for osteomyelitis.  Case discussed with vascular surgery and podiatry's recommend the patient transferred to [facility] for further evaluation and management.  Patient is at this moment facing the need of surgical intervention/amputation.  Cultures taken and per diabetic foot ulcer broad-spectrum antibiotics initiated (patient allergic to amoxicillin) ".  05/21/2021: Patient seen.  Patient has undergone ray amputation of the right fifth toe.  Podiatry team plans for further debridement turning Tuesday or Wednesday and, hopefully, closure of the wound.  05/22/2021: Patient seen alongside patient's father and stepmom.  Patient is stable postop.  For possible repeat surgical debridement tomorrow the day after tomorrow according to the podiatry team.  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 -Further surgery is planned for next week. -Continue antibiotics. -Further management as per podiatry.    Hypertension:  -Patient is currently on Coreg 25 Mg p.o. twice daily and isosorbide mononitrate 30 Mg p.o. once daily. -Goal blood pressure should be less than 130/80 mmHg. -Continue to monitor closely.    Uncontrolled type 1 diabetes mellitus with hyperglycemia: -Follow hemoglobin A1c. -Started on sliding scale insulin and Lantus.  Gastroesophageal reflux disease: -Continue PPI  Hyperlipidemia: -Continue statins.  DVT prophylaxis: Subcutaneous Lovenox Code Status: Full code Family Communication:  Disposition Plan: Likely home eventual   Consultants:   Podiatry   Procedures:   Ray amputation of the fifth right toe.  Antimicrobials:   IV Rocephin IV vancomycin    Subjective: No new complaints. No fever or chills. No chest pain or shortness of breath  Objective: Vitals:   05/21/21 2001 05/22/21 0637 05/22/21 0822 05/22/21 1500  BP: 129/76 (!) 137/96 (!) 146/94 119/73  Pulse: (!) 101 97 97 98  Resp: 18 16 17 18   Temp: 98.6 F (37 C) 98.3 F (36.8 C) 98.7 F (37.1 C) 97.6 F (36.4 C)  TempSrc: Oral Oral Oral Oral  SpO2: 100% 100% 99% 100%  Weight:      Height:        Intake/Output Summary (Last 24 hours) at 05/22/2021 1712 Last data filed at 05/22/2021 1650 Gross per 24 hour  Intake 240 ml  Output 1400 ml  Net -1160 ml   Filed Weights   05/20/21 0907 05/20/21 1523 05/21/21 1421  Weight: 97.5 kg  97.5 kg 97.5 kg    Examination:  General exam: Appears calm and comfortable  Respiratory system: Clear to auscultation.  Cardiovascular system: S1 & S2 heard Gastrointestinal system: Abdomen is nondistended, soft and nontender. No  organomegaly or masses felt. Normal bowel sounds heard. Central nervous system: Alert and oriented. No focal neurological deficits. Extremities: Right foot is wrapped.  Data Reviewed: I have personally reviewed following labs and imaging studies  CBC: Recent Labs  Lab 05/20/21 1011  WBC 18.5*  NEUTROABS 14.7*  HGB 14.0  HCT 42.0  MCV 83.7  PLT 818   Basic Metabolic Panel: Recent Labs  Lab 05/20/21 1011 05/22/21 0050  NA 130*  --   K 4.1  --   CL 95*  --   CO2 25  --   GLUCOSE 396*  --   BUN 11  --   CREATININE 0.72 0.66  CALCIUM 9.7  --    GFR: Estimated Creatinine Clearance: 171.6 mL/min (by C-G formula based on SCr of 0.66 mg/dL). Liver Function Tests: No results for input(s): AST, ALT, ALKPHOS, BILITOT, PROT, ALBUMIN in the last 168 hours. No results for input(s): LIPASE, AMYLASE in the last 168 hours. No results for input(s): AMMONIA in the last 168 hours. Coagulation Profile: No results for input(s): INR, PROTIME in the last 168 hours. Cardiac Enzymes: No results for input(s): CKTOTAL, CKMB, CKMBINDEX, TROPONINI in the last 168 hours. BNP (last 3 results) No results for input(s): PROBNP in the last 8760 hours. HbA1C: No results for input(s): HGBA1C in the last 72 hours. CBG: Recent Labs  Lab 05/21/21 1650 05/21/21 2019 05/22/21 0649 05/22/21 1208 05/22/21 1552  GLUCAP 178* 195* 216* 200* 113*   Lipid Profile: No results for input(s): CHOL, HDL, LDLCALC, TRIG, CHOLHDL, LDLDIRECT in the last 72 hours. Thyroid Function Tests: No results for input(s): TSH, T4TOTAL, FREET4, T3FREE, THYROIDAB in the last 72 hours. Anemia Panel: No results for input(s): VITAMINB12, FOLATE, FERRITIN, TIBC, IRON, RETICCTPCT in the last 72 hours. Urine analysis:    Component Value Date/Time   COLORURINE STRAW (A) 08/10/2018 1425   APPEARANCEUR CLEAR 08/10/2018 1425   LABSPEC 1.035 (H) 08/10/2018 1425   PHURINE 7.0 08/10/2018 1425   GLUCOSEU >=500 (A) 08/10/2018 1425    HGBUR NEGATIVE 08/10/2018 1425   BILIRUBINUR NEGATIVE 08/10/2018 1425   KETONESUR 5 (A) 08/10/2018 1425   PROTEINUR NEGATIVE 08/10/2018 1425   NITRITE NEGATIVE 08/10/2018 1425   LEUKOCYTESUR NEGATIVE 08/10/2018 1425   Sepsis Labs: @LABRCNTIP (procalcitonin:4,lacticidven:4)  ) Recent Results (from the past 240 hour(s))  Resp Panel by RT-PCR (Flu A&B, Covid) Nasopharyngeal Swab     Status: None   Collection Time: 05/20/21 12:46 PM   Specimen: Nasopharyngeal Swab; Nasopharyngeal(NP) swabs in vial transport medium  Result Value Ref Range Status   SARS Coronavirus 2 by RT PCR NEGATIVE NEGATIVE Final    Comment: (NOTE) SARS-CoV-2 target nucleic acids are NOT DETECTED.  The SARS-CoV-2 RNA is generally detectable in upper respiratory specimens during the acute phase of infection. The lowest concentration of SARS-CoV-2 viral copies this assay can detect is 138 copies/mL. A negative result does not preclude SARS-Cov-2 infection and should not be used as the sole basis for treatment or other patient management decisions. A negative result may occur with  improper specimen collection/handling, submission of specimen other than nasopharyngeal swab, presence of viral mutation(s) within the areas targeted by this assay, and inadequate number of viral copies(<138 copies/mL). A negative result must be combined with clinical observations, patient history, and  epidemiological information. The expected result is Negative.  Fact Sheet for Patients:  EntrepreneurPulse.com.au  Fact Sheet for Healthcare Providers:  IncredibleEmployment.be  This test is no t yet approved or cleared by the [place] FDA and  has been authorized for detection and/or diagnosis of SARS-CoV-2 by FDA under an Emergency Use Authorization (EUA). This EUA will remain  in effect (meaning this test can be used) for the duration of the COVID-19 declaration under Section 564(b)(1) of the  Act, 21 U.S.C.section 360bbb-3(b)(1), unless the authorization is terminated  or revoked sooner.       Influenza A by PCR NEGATIVE NEGATIVE Final   Influenza B by PCR NEGATIVE NEGATIVE Final    Comment: (NOTE) The Xpert Xpress SARS-CoV-2/FLU/RSV plus assay is intended as an aid in the diagnosis of influenza from Nasopharyngeal swab specimens and should not be used as a sole basis for treatment. Nasal washings and aspirates are unacceptable for Xpert Xpress SARS-CoV-2/FLU/RSV testing.  Fact Sheet for Patients: EntrepreneurPulse.com.au  Fact Sheet for Healthcare Providers: IncredibleEmployment.be  This test is not yet approved or cleared by the [place] FDA and has been authorized for detection and/or diagnosis of SARS-CoV-2 by FDA under an Emergency Use Authorization (EUA). This EUA will remain in effect (meaning this test can be used) for the duration of the COVID-19 declaration under Section 564(b)(1) of the Act, 21 U.S.C. section 360bbb-3(b)(1), unless the authorization is terminated or revoked.  Performed at [facility], [address], [place], [place] [address]   Culture, blood (routine x 2)     Status: None (Preliminary result)   Collection Time: 05/20/21  1:24 PM   Specimen: BLOOD LEFT ARM  Result Value Ref Range Status   Specimen Description BLOOD LEFT ARM BOTTLES DRAWN AEROBIC AND ANAEROBIC  Final   Special Requests Blood Culture adequate volume  Final   Culture   Final    NO GROWTH 2 DAYS Performed at [facility], [address], [place], [place] [address]    Report Status PENDING  Incomplete  Culture, blood (routine x 2)     Status: None (Preliminary result)   Collection Time: 05/20/21  1:24 PM   Specimen: BLOOD LEFT HAND  Result Value Ref Range Status   Specimen Description   Final    BLOOD LEFT HAND BOTTLES DRAWN AEROBIC AND ANAEROBIC   Special Requests Blood Culture adequate volume  Final   Culture   Final    NO  GROWTH 2 DAYS Performed at [facility] [address] [place], [place] [address]    Report Status PENDING  Incomplete  Surgical pcr screen     Status: None   Collection Time: 05/20/21  8:29 PM   Specimen: Nasal Mucosa; Nasal Swab  Result Value Ref Range Status   MRSA, PCR NEGATIVE NEGATIVE Final   Staphylococcus aureus NEGATIVE NEGATIVE Final    Comment: (NOTE) The Xpert SA Assay (FDA approved for NASAL specimens in patients 31 years of age and older), is one component of a comprehensive surveillance program. It is not intended to diagnose infection nor to guide or monitor treatment. Performed at [facility], [place] [address] [place],  [address]   Aerobic/Anaerobic Culture w Gram Stain (surgical/deep wound)     Status: None (Preliminary result)   Collection Time: 05/21/21  3:10 PM   Specimen: PATH Other; Tissue  Result Value Ref Range Status   Specimen Description WOUND  Final   Special Requests WOUND CULTURE OF 5TH METATARSAL RIGHT SPEC A  Final   Gram Stain NO WBC SEEN NO ORGANISMS SEEN   Final   Culture   Final    TOO YOUNG TO READ Performed at [facility], [place] [address] [place], [place] [address]    Report Status PENDING  Incomplete  Aerobic/Anaerobic Culture w Gram Stain (surgical/deep wound)     Status: None (Preliminary result)   Collection Time: 05/21/21  3:12 PM   Specimen: PATH Other; Tissue  Result Value Ref Range Status   Specimen Description BONE  Final   Special Requests BONE CULTURE OF 4TH METATARSAL RIGHT SPEC B  Final   Gram Stain   Final    FEW WBC PRESENT, PREDOMINANTLY MONONUCLEAR NO ORGANISMS SEEN    Culture   Final    NO GROWTH < 24 HOURS Performed at [facility], [place] [address] [place], [place] [address]    Report Status PENDING  Incomplete         Radiology Studies: MR FOOT RIGHT W WO CONTRAST  Result Date: 05/21/2021 CLINICAL DATA:  Worsening infection and drainage of the right foot. EXAM: MRI OF THE  RIGHT FOREFOOT WITHOUT AND WITH CONTRAST TECHNIQUE: Multiplanar, multisequence MR imaging of the right forefoot was performed before and after the administration of intravenous contrast. CONTRAST:  30mL GADAVIST GADOBUTROL 1 MMOL/ML IV SOLN COMPARISON:  None. FINDINGS: Bones/Joint/Cartilage Large soft tissue wound overlying the fifth metatarsal head. Cortical destruction of the fifth metatarsal head with extensive bone marrow edema throughout the first metatarsal head and extending into the shaft most consistent with osteomyelitis. Bone destruction at the base of the fifth proximal phalanx with associated bone marrow edema consistent with osteomyelitis. Complex fluid collection centered around the fifth MTP joint measuring 2.9 x 2.5 x 1.6 cm extending from the plantar aspect to the dorsal aspect consistent with an abscess. Bone marrow edema in the fifth distal phalanx which may be reactive versus secondary to osteomyelitis. Marrow edema in the head of fourth proximal phalanx without cortical destruction may reflect reactive edema versus early osteomyelitis. No acute fracture or dislocation. Normal alignment. No joint effusion. Ligaments Collateral ligaments are intact.  Lisfranc ligament is intact. Muscles and Tendons Flexor, peroneal and extensor compartment tendons are intact. Diffuse T2 hyperintense signal throughout the plantar musculature likely neurogenic. An element of myositis is not completely excluded. Soft tissue No other fluid collection or hematoma. No soft tissue mass. Severe soft tissue edema along the dorsal aspect of the foot concerning for cellulitis. IMPRESSION: 1. Large soft tissue wound overlying the fifth metatarsal head. Osteomyelitis of the fifth metatarsal head and base of the fifth proximal phalanx. Complex fluid collection centered around the fifth MTP joint measuring 2.9 x 2.5 x 1.6 cm extending from the plantar aspect to the dorsal aspect consistent with an abscess. Bone marrow edema in  the fifth distal phalanx which may be reactive versus secondary to osteomyelitis. 2. Marrow edema in the head of fourth proximal phalanx without cortical destruction may reflect reactive edema versus early osteomyelitis. 3. Severe soft tissue edema along the dorsal aspect of the foot concerning for cellulitis. Electronically Signed   By: Kathreen Devoid   On: 05/21/2021 10:58   DG Foot 2 Views Right  Result Date: 05/21/2021 CLINICAL DATA:  Status post surgery for osteomyelitis. EXAM: RIGHT FOOT - 2 VIEW COMPARISON:  05/20/2021. FINDINGS: The fifth toe and the fifth metatarsal from the midshaft distally have been resected. The resected end of the remaining fifth metatarsal is well-defined. Small focus of  lucency with resorption of the overlying cortex, along the distal, lateral aspect of the proximal phalanx of the fourth toe. This is not evident on the previous day's study, which would argue against osteomyelitis. Still, a small focus of osteomyelitis is possible. There is evidence of a subtle overlying soft tissue defect. There are no additional areas of bone lucency or resorption to suggest osteomyelitis. Joints are normally spaced and aligned. No soft tissue air. IMPRESSION: 1. Status post resection of the fifth toe and the middle through distal aspect of the fifth metatarsal. 2. Small focus of lucency in the distal lateral aspect of the proximal phalanx of the fourth toe. This could potentially reflect a small focus osteomyelitis. It may be related to the recent surgery. Recommend follow-up imaging in 4-6 weeks to reassess. Electronically Signed   By: Lajean Manes M.D.   On: 05/21/2021 16:42        Scheduled Meds: . aspirin EC  81 mg Oral Daily  . atorvastatin  10 mg Oral q1800  . carvedilol  25 mg Oral BID  . enoxaparin (LOVENOX) injection  40 mg Subcutaneous Q24H  . feeding supplement (GLUCERNA SHAKE)  237 mL Oral TID BM  . insulin aspart  0-15 Units Subcutaneous TID WC  . insulin aspart  0-5  Units Subcutaneous QHS  . insulin glargine  20 Units Subcutaneous BID  . isosorbide mononitrate  30 mg Oral Daily  . nutrition supplement (JUVEN)  1 packet Oral BID BM  . pantoprazole  40 mg Oral Daily   Continuous Infusions: . cefTRIAXone (ROCEPHIN)  IV 2 g (05/22/21 1225)  . vancomycin 1,500 mg (05/22/21 1327)     LOS: 2 days    Time spent: 25 minutes    Dana Allan, MD  [facility] Pager #: [phone] 7PM-7AM contact night coverage as above

## 2021-05-22 NOTE — Progress Notes (Signed)
Orthopedic Tech Progress Note Patient Details:  [name] Oct 08, 1996 [id number]  Ortho Devices Type of Ortho Device: CAM walker Ortho Device/Splint Location: delivered to room Ortho Device/Splint Interventions: Ordered,Application,Adjustment   Post Interventions Patient Tolerated: Well Instructions Provided: Care of device,Adjustment of device   Karolee Stamps 05/22/2021, 6:30 AM

## 2021-05-23 ENCOUNTER — Ambulatory Visit (HOSPITAL_COMMUNITY): Payer: BC Managed Care – PPO | Admitting: Physical Therapy

## 2021-05-23 ENCOUNTER — Telehealth (HOSPITAL_COMMUNITY): Payer: Self-pay | Admitting: Occupational Therapy

## 2021-05-23 LAB — GLUCOSE, CAPILLARY
Glucose-Capillary: 172 mg/dL — ABNORMAL HIGH (ref 70–99)
Glucose-Capillary: 283 mg/dL — ABNORMAL HIGH (ref 70–99)
Glucose-Capillary: 118 mg/dL — ABNORMAL HIGH (ref 70–99)
Glucose-Capillary: 172 mg/dL — ABNORMAL HIGH (ref 70–99)

## 2021-05-23 LAB — CREATININE, SERUM
Creatinine, Ser: 0.7 mg/dL (ref 0.61–1.24)
GFR, Estimated: >60 mL/min (ref >60)

## 2021-05-23 LAB — HEMOGLOBIN A1C
Hgb A1c MFr Bld: 10.5 % — ABNORMAL HIGH (ref 4.8–5.6)
Mean Plasma Glucose: 255 mg/dL

## 2021-05-23 MED ORDER — LIVING WELL WITH DIABETES BOOK
Freq: Once | Status: DC
  Filled 2021-05-23 (×2): qty 1

## 2021-05-23 NOTE — Progress Notes (Signed)
PROGRESS NOTE    [name]  KWI:[id number] DOB: 13-Apr-1996 DOA: 05/20/2021 PCP: [name], MD  Outpatient Specialists:   Brief Narrative:  Patient is a 25 year old Caucasian male with past medical history significant for hypertension, diabetes, hyperlipidemia, mild LE arterial insufficiency (normal ankle/arm index on previous arterial dopplers), neuropathy and chronic right foot wound who presented to the emergency department secondary to increased drainage, swelling and redness on the lateral aspect of his right foot.  Patient reported the symptoms had been present for the last 2 to 3 days and was progressing.  Chest x-ray revealed osteomyelitis.  Patient has undergone amputation of the right fifth toe.  The plan is to take patient back to the OR tomorrow, 05/24/2021 all or 05/25/2021 for further I&D, depending on the result of the bone culture.    Assessment & Plan:   Principal Problem:   Acute osteomyelitis of metatarsal bone of right foot (HCC) Active Problems:   HTN (hypertension)   Uncontrolled type 1 diabetes mellitus with hyperglycemia (HCC)   Gastroesophageal reflux disease   HLD (hyperlipidemia)  Acute osteomyelitis of metatarsal bone of right foot: -Patient has undergone ray amputation of the fifth right toe. -Further surgery is planned  -Continue antibiotics. -Follow results of the bone culture. -Further management as per podiatry.    Hypertension:  -Patient is currently on Coreg 25 Mg p.o. twice daily and isosorbide mononitrate 30 Mg p.o. once daily. -Goal blood pressure should be less than 130/80 mmHg. -Continue to monitor closely.    Uncontrolled type 1 diabetes mellitus with hyperglycemia: -Follow hemoglobin A1c. -Started on sliding scale insulin and Lantus.  Gastroesophageal reflux disease: -Continue PPI  Hyperlipidemia: -Continue statins.  DVT prophylaxis: Subcutaneous Lovenox Code Status: Full code Family Communication:   Disposition Plan: Likely home eventual   Consultants:   Podiatry   Procedures:   Ray amputation of the fifth right toe.  Antimicrobials:   IV Rocephin IV vancomycin    Subjective: No new complaints. No fever or chills. No chest pain or shortness of breath  Objective: Vitals:   05/22/21 2113 05/23/21 0527 05/23/21 0801 05/23/21 1436  BP: 122/75 127/81 126/79 125/72  Pulse: 96 88 93 94  Resp:  16 16 14   Temp: 98 F (36.7 C) 97.9 F (36.6 C) 97.9 F (36.6 C) 98 F (36.7 C)  TempSrc: Oral Oral Oral Oral  SpO2: 99% 99% 99% 96%  Weight:      Height:        Intake/Output Summary (Last 24 hours) at 05/23/2021 1814 Last data filed at 05/23/2021 1100 Gross per 24 hour  Intake --  Output 1000 ml  Net -1000 ml   Filed Weights   05/20/21 0907 05/20/21 1523 05/21/21 1421  Weight: 97.5 kg 97.5 kg 97.5 kg    Examination:  General exam: Appears calm and comfortable  Respiratory system: Clear to auscultation.  Cardiovascular system: S1 & S2 heard Gastrointestinal system: Abdomen is nondistended, soft and nontender. No organomegaly or masses felt. Normal bowel sounds heard. Central nervous system: Alert and oriented. No focal neurological deficits. Extremities: Right foot is wrapped.  Data Reviewed: I have personally reviewed following labs and imaging studies  CBC: Recent Labs  Lab 05/20/21 1011  WBC 18.5*  NEUTROABS 14.7*  HGB 14.0  HCT 42.0  MCV 83.7  PLT 242   Basic Metabolic Panel: Recent Labs  Lab 05/20/21 1011 05/22/21 0050 05/23/21 0144  NA 130*  --   --   K 4.1  --   --  CL 95*  --   --   CO2 25  --   --   GLUCOSE 396*  --   --   BUN 11  --   --   CREATININE 0.72 0.66 0.70  CALCIUM 9.7  --   --    GFR: Estimated Creatinine Clearance: 171.6 mL/min (by C-G formula based on SCr of 0.7 mg/dL). Liver Function Tests: No results for input(s): AST, ALT, ALKPHOS, BILITOT, PROT, ALBUMIN in the last 168 hours. No results for input(s): LIPASE,  AMYLASE in the last 168 hours. No results for input(s): AMMONIA in the last 168 hours. Coagulation Profile: No results for input(s): INR, PROTIME in the last 168 hours. Cardiac Enzymes: No results for input(s): CKTOTAL, CKMB, CKMBINDEX, TROPONINI in the last 168 hours. BNP (last 3 results) No results for input(s): PROBNP in the last 8760 hours. HbA1C: No results for input(s): HGBA1C in the last 72 hours. CBG: Recent Labs  Lab 05/22/21 1552 05/22/21 2113 05/23/21 0526 05/23/21 1123 05/23/21 1610  GLUCAP 113* 174* 172* 283* 118*   Lipid Profile: No results for input(s): CHOL, HDL, LDLCALC, TRIG, CHOLHDL, LDLDIRECT in the last 72 hours. Thyroid Function Tests: No results for input(s): TSH, T4TOTAL, FREET4, T3FREE, THYROIDAB in the last 72 hours. Anemia Panel: No results for input(s): VITAMINB12, FOLATE, FERRITIN, TIBC, IRON, RETICCTPCT in the last 72 hours. Urine analysis:    Component Value Date/Time   COLORURINE STRAW (A) 08/10/2018 1425   APPEARANCEUR CLEAR 08/10/2018 1425   LABSPEC 1.035 (H) 08/10/2018 1425   PHURINE 7.0 08/10/2018 1425   GLUCOSEU >=500 (A) 08/10/2018 1425   HGBUR NEGATIVE 08/10/2018 1425   BILIRUBINUR NEGATIVE 08/10/2018 1425   KETONESUR 5 (A) 08/10/2018 1425   PROTEINUR NEGATIVE 08/10/2018 1425   NITRITE NEGATIVE 08/10/2018 1425   LEUKOCYTESUR NEGATIVE 08/10/2018 1425   Sepsis Labs: @LABRCNTIP (procalcitonin:4,lacticidven:4)  ) Recent Results (from the past 240 hour(s))  Resp Panel by RT-PCR (Flu A&B, Covid) Nasopharyngeal Swab     Status: None   Collection Time: 05/20/21 12:46 PM   Specimen: Nasopharyngeal Swab; Nasopharyngeal(NP) swabs in vial transport medium  Result Value Ref Range Status   SARS Coronavirus 2 by RT PCR NEGATIVE NEGATIVE Final    Comment: (NOTE) SARS-CoV-2 target nucleic acids are NOT DETECTED.  The SARS-CoV-2 RNA is generally detectable in upper respiratory specimens during the acute phase of infection. The  lowest concentration of SARS-CoV-2 viral copies this assay can detect is 138 copies/mL. A negative result does not preclude SARS-Cov-2 infection and should not be used as the sole basis for treatment or other patient management decisions. A negative result may occur with  improper specimen collection/handling, submission of specimen other than nasopharyngeal swab, presence of viral mutation(s) within the areas targeted by this assay, and inadequate number of viral copies(<138 copies/mL). A negative result must be combined with clinical observations, patient history, and epidemiological information. The expected result is Negative.  Fact Sheet for Patients:  EntrepreneurPulse.com.au  Fact Sheet for Healthcare Providers:  IncredibleEmployment.be  This test is no t yet approved or cleared by the [place] FDA and  has been authorized for detection and/or diagnosis of SARS-CoV-2 by FDA under an Emergency Use Authorization (EUA). This EUA will remain  in effect (meaning this test can be used) for the duration of the COVID-19 declaration under Section 564(b)(1) of the Act, 21 U.S.C.section 360bbb-3(b)(1), unless the authorization is terminated  or revoked sooner.       Influenza A by PCR NEGATIVE NEGATIVE Final  Influenza B by PCR NEGATIVE NEGATIVE Final    Comment: (NOTE) The Xpert Xpress SARS-CoV-2/FLU/RSV plus assay is intended as an aid in the diagnosis of influenza from Nasopharyngeal swab specimens and should not be used as a sole basis for treatment. Nasal washings and aspirates are unacceptable for Xpert Xpress SARS-CoV-2/FLU/RSV testing.  Fact Sheet for Patients: EntrepreneurPulse.com.au  Fact Sheet for Healthcare Providers: IncredibleEmployment.be  This test is not yet approved or cleared by the [place] FDA and has been authorized for detection and/or diagnosis of SARS-CoV-2 by FDA under  an Emergency Use Authorization (EUA). This EUA will remain in effect (meaning this test can be used) for the duration of the COVID-19 declaration under Section 564(b)(1) of the Act, 21 U.S.C. section 360bbb-3(b)(1), unless the authorization is terminated or revoked.  Performed at [facility], [address] [place], [place] [address]   Culture, blood (routine x 2)     Status: None (Preliminary result)   Collection Time: 05/20/21  1:24 PM   Specimen: BLOOD LEFT ARM  Result Value Ref Range Status   Specimen Description BLOOD LEFT ARM BOTTLES DRAWN AEROBIC AND ANAEROBIC  Final   Special Requests Blood Culture adequate volume  Final   Culture   Final    NO GROWTH 3 DAYS Performed at [facility], [address], [place], [place] [address]    Report Status PENDING  Incomplete  Culture, blood (routine x 2)     Status: None (Preliminary result)   Collection Time: 05/20/21  1:24 PM   Specimen: BLOOD LEFT HAND  Result Value Ref Range Status   Specimen Description   Final    BLOOD LEFT HAND BOTTLES DRAWN AEROBIC AND ANAEROBIC   Special Requests Blood Culture adequate volume  Final   Culture   Final    NO GROWTH 3 DAYS Performed at [facility], [address] [place], [place] [address]    Report Status PENDING  Incomplete  Surgical pcr screen     Status: None   Collection Time: 05/20/21  8:29 PM   Specimen: Nasal Mucosa; Nasal Swab  Result Value Ref Range Status   MRSA, PCR NEGATIVE NEGATIVE Final   Staphylococcus aureus NEGATIVE NEGATIVE Final    Comment: (NOTE) The Xpert SA Assay (FDA approved for NASAL specimens in patients 79 years of age and older), is one component of a comprehensive surveillance program. It is not intended to diagnose infection nor to guide or monitor treatment. Performed at [facility], [place] [address], [place], [place] [address]   Aerobic/Anaerobic Culture w Gram Stain (surgical/deep wound)     Status: None (Preliminary result)   Collection Time:  05/21/21  3:10 PM   Specimen: PATH Other; Tissue  Result Value Ref Range Status   Specimen Description WOUND  Final   Special Requests WOUND CULTURE OF 5TH METATARSAL RIGHT SPEC A  Final   Gram Stain NO WBC SEEN NO ORGANISMS SEEN   Final   Culture   Final    FEW STAPHYLOCOCCUS AUREUS SUSCEPTIBILITIES TO FOLLOW NO ANAEROBES ISOLATED Performed at [facility], [place] [address], [place], [place] [address]    Report Status PENDING  Incomplete  Aerobic/Anaerobic Culture w Gram Stain (surgical/deep wound)     Status: None (Preliminary result)   Collection Time: 05/21/21  3:12 PM   Specimen: PATH Other; Tissue  Result Value Ref Range Status   Specimen Description BONE  Final   Special Requests BONE CULTURE OF 4TH METATARSAL RIGHT SPEC B  Final   Gram Stain   Final    FEW WBC PRESENT, PREDOMINANTLY MONONUCLEAR NO ORGANISMS SEEN    Culture   Final    NO GROWTH 2 DAYS NO ANAEROBES ISOLATED; CULTURE IN PROGRESS FOR 5 DAYS Performed at [facility], [address], [place], [place] [address]    Report Status PENDING  Incomplete         Radiology Studies: No results found.      Scheduled Meds: . aspirin EC  81 mg Oral Daily  . atorvastatin  10 mg Oral q1800  . carvedilol  25 mg Oral BID  . enoxaparin (LOVENOX) injection  40 mg Subcutaneous Q24H  . feeding supplement (GLUCERNA SHAKE)  237 mL Oral TID BM  . insulin aspart  0-15 Units Subcutaneous TID WC  . insulin aspart  0-5 Units Subcutaneous QHS  . insulin glargine  20 Units Subcutaneous BID  . isosorbide mononitrate  30 mg Oral Daily  . living well with diabetes book   Does not apply Once  . nutrition supplement (JUVEN)  1 packet Oral BID BM  . pantoprazole  40 mg Oral Daily   Continuous Infusions: . cefTRIAXone (ROCEPHIN)  IV 2 g (05/23/21 1206)  . vancomycin 1,500 mg (05/23/21 1320)     LOS: 3 days    Time spent: 25 minutes    Dana Allan, MD  [facility] Pager #: [phone] 7PM-7AM contact night coverage as above

## 2021-05-23 NOTE — Progress Notes (Signed)
Inpatient Diabetes Program Recommendations  AACE/ADA: New Consensus Statement on Inpatient Glycemic Control (2015)  Target Ranges:  Prepandial:   less than 140 mg/dL      Peak postprandial:   less than 180 mg/dL (1-2 hours)      Critically ill patients:  140 - 180 mg/dL   Lab Results  Component Value Date   GLUCAP 283 (H) 05/23/2021   HGBA1C 10.5 (H) 05/20/2021    Review of Glycemic Control Results for [name], [name] (MRN [id number]) as of 05/23/2021 14:13  Ref. Range 05/23/2021 05:26 05/23/2021 11:23  Glucose-Capillary Latest Ref Range: 70 - 99 mg/dL 172 (H) 283 (H)   Diabetes history: DM1 Outpatient Diabetes medications: Lantus 37-39 units BID, Humalog 17-19 units TID, Ozempic 1 mg daily Current orders for Inpatient glycemic control: Novolog 0-15 units TID and 0-5 QHS, Lantus 20 units BID  Inpatient Diabetes Program Recommendations:    Novolog 3 units TID with meals if eats at least 50%.  Spoke with patient at bedside.  He states he was diagnosed with DM in the 6th grade.  Reviewed patient's current A1c of 10.5% (average blood sugar of 255 mg/dL). Explained what a A1c is and what it measures. Also reviewed goal A1c with patient, importance of good glucose control @ home, and blood sugar goals.  He states his A1C is usually between 9-11%.  He is current with his PCP who prescribes his DM medications.  He seems to have limited understanding of DM management.  He administers above home medications as prescribed.  He is on his moms insurance.  Explained importance of good glucose control for wound optimal wound healing.  He states he was told to eat a lot of CHO's when he injured his ACL back in November.  He also states he drinks sweet tea and orange juice.  Explained importance of eliminating beverages with sugar and increasing his protein while limiting CHO's.  Discussed The Plate Method.  Also reviewed CHO's and foods that contain CHO's. Ordered the LWWD.  He checks his CBG's 3-4 times a  day.  Discussed hypoglycemia, signs, symptoms and treatment.    Will continue to follow while inpatient.  Thank you, Reche Dixon, RN, BSN Diabetes Coordinator Inpatient Diabetes Program [phone] (team pager from 8a-5p)

## 2021-05-23 NOTE — Progress Notes (Signed)
Subjective:  Patient ID: [name], male    DOB: [birth year],  MRN: [id number]  Patient seen bedside denies pain. Denies other complaints. Understands plan for OR tomorrow.  Objective:   Vitals:   05/23/21 0801 05/23/21 1436  BP: 126/79 125/72  Pulse: 93 94  Resp: 16 14  Temp: 97.9 F (36.6 C) 98 F (36.7 C)  SpO2: 99% 96%   General AA&O x3. Normal mood and affect.  Vascular Dorsalis pedis and posterior tibial pulses 2/4 bilat. Brisk capillary refill to all digits. Pedal hair present.  Neurologic Epicritic sensation grossly absent.  Dermatologic Right foot incisions healing well with mild SS drainage no warmth, no erythema, no signs of acute infection noted.  Orthopedic: MMT 5/5 in dorsiflexion, plantarflexion, inversion, and eversion. Normal joint ROM without pain or crepitus.   Recent Results (from the past 240 hour(s))  Resp Panel by RT-PCR (Flu A&B, Covid) Nasopharyngeal Swab     Status: None   Collection Time: 05/20/21 12:46 PM   Specimen: Nasopharyngeal Swab; Nasopharyngeal(NP) swabs in vial transport medium  Result Value Ref Range Status   SARS Coronavirus 2 by RT PCR NEGATIVE NEGATIVE Final    Comment: (NOTE) SARS-CoV-2 target nucleic acids are NOT DETECTED.  The SARS-CoV-2 RNA is generally detectable in upper respiratory specimens during the acute phase of infection. The lowest concentration of SARS-CoV-2 viral copies this assay can detect is 138 copies/mL. A negative result does not preclude SARS-Cov-2 infection and should not be used as the sole basis for treatment or other patient management decisions. A negative result may occur with  improper specimen collection/handling, submission of specimen other than nasopharyngeal swab, presence of viral mutation(s) within the areas targeted by this assay, and inadequate number of viral copies(<138 copies/mL). A negative result must be combined with clinical observations, patient history, and  epidemiological information. The expected result is Negative.  Fact Sheet for Patients:  EntrepreneurPulse.com.au  Fact Sheet for Healthcare Providers:  IncredibleEmployment.be  This test is no t yet approved or cleared by the [place] FDA and  has been authorized for detection and/or diagnosis of SARS-CoV-2 by FDA under an Emergency Use Authorization (EUA). This EUA will remain  in effect (meaning this test can be used) for the duration of the COVID-19 declaration under Section 564(b)(1) of the Act, 21 U.S.C.section 360bbb-3(b)(1), unless the authorization is terminated  or revoked sooner.       Influenza A by PCR NEGATIVE NEGATIVE Final   Influenza B by PCR NEGATIVE NEGATIVE Final    Comment: (NOTE) The Xpert Xpress SARS-CoV-2/FLU/RSV plus assay is intended as an aid in the diagnosis of influenza from Nasopharyngeal swab specimens and should not be used as a sole basis for treatment. Nasal washings and aspirates are unacceptable for Xpert Xpress SARS-CoV-2/FLU/RSV testing.  Fact Sheet for Patients: EntrepreneurPulse.com.au  Fact Sheet for Healthcare Providers: IncredibleEmployment.be  This test is not yet approved or cleared by the [place] FDA and has been authorized for detection and/or diagnosis of SARS-CoV-2 by FDA under an Emergency Use Authorization (EUA). This EUA will remain in effect (meaning this test can be used) for the duration of the COVID-19 declaration under Section 564(b)(1) of the Act, 21 U.S.C. section 360bbb-3(b)(1), unless the authorization is terminated or revoked.  Performed at [facility], [address], [place],  [address]   Culture, blood (routine x 2)     Status: None (Preliminary result)   Collection Time: 05/20/21  1:24 PM   Specimen: BLOOD LEFT ARM  Result Value Ref Range Status   Specimen Description BLOOD LEFT ARM BOTTLES DRAWN AEROBIC AND ANAEROBIC   Final   Special Requests Blood Culture adequate volume  Final   Culture   Final    NO GROWTH 3 DAYS Performed at [facility], [address], [place], [place] [address]    Report Status PENDING  Incomplete  Culture, blood (routine x 2)     Status: None (Preliminary result)   Collection Time: 05/20/21  1:24 PM   Specimen: BLOOD LEFT HAND  Result Value Ref Range Status   Specimen Description   Final    BLOOD LEFT HAND BOTTLES DRAWN AEROBIC AND ANAEROBIC   Special Requests Blood Culture adequate volume  Final   Culture   Final    NO GROWTH 3 DAYS Performed at [facility], [address] [place], [place] [address]    Report Status PENDING  Incomplete  Surgical pcr screen     Status: None   Collection Time: 05/20/21  8:29 PM   Specimen: Nasal Mucosa; Nasal Swab  Result Value Ref Range Status   MRSA, PCR NEGATIVE NEGATIVE Final   Staphylococcus aureus NEGATIVE NEGATIVE Final    Comment: (NOTE) The Xpert SA Assay (FDA approved for NASAL specimens in patients 32 years of age and older), is one component of a comprehensive surveillance program. It is not intended to diagnose infection nor to guide or monitor treatment. Performed at [facility], [place] [address] [place], [place] [address]   Aerobic/Anaerobic Culture w Gram Stain (surgical/deep wound)     Status: None (Preliminary result)   Collection Time: 05/21/21  3:10 PM   Specimen: PATH Other; Tissue  Result Value Ref Range Status   Specimen Description WOUND  Final   Special Requests WOUND CULTURE OF 5TH METATARSAL RIGHT SPEC A  Final   Gram Stain NO WBC SEEN NO ORGANISMS SEEN   Final   Culture   Final    FEW STAPHYLOCOCCUS AUREUS SUSCEPTIBILITIES TO FOLLOW NO ANAEROBES ISOLATED Performed at [facility], [place] [address] [place], [place] [address]    Report Status PENDING  Incomplete  Aerobic/Anaerobic Culture w Gram Stain (surgical/deep wound)     Status: None (Preliminary result)   Collection Time:  05/21/21  3:12 PM   Specimen: PATH Other; Tissue  Result Value Ref Range Status   Specimen Description BONE  Final   Special Requests BONE CULTURE OF 4TH METATARSAL RIGHT SPEC B  Final   Gram Stain   Final    FEW WBC PRESENT, PREDOMINANTLY MONONUCLEAR NO ORGANISMS SEEN    Culture   Final    NO GROWTH 2 DAYS NO ANAEROBES ISOLATED; CULTURE IN PROGRESS FOR 5 DAYS Performed at [facility], [place] [address], [place],  [address]    Report Status PENDING  Incomplete    Assessment & Plan:  Patient was evaluated and treated and all questions answered.  OM right 5th toe s/p partial 5th ray amputation -Patient seen and evaluated. All questions answered. -No new labs today. -Planned RTOR for wound debridement and closure tomorrow -No growth 4th metatarsal at this time. Will continue to monitor. -Will continue to follow -NPO after MN tonight.  Evelina Bucy, DPM  Accessible via secure chat for questions or concerns.

## 2021-05-23 NOTE — Telephone Encounter (Signed)
Pt left message to cancel appt for Tuesday 05/23/21, no reason given.  Guadelupe Sabin, OTR/L  [phone]

## 2021-05-24 ENCOUNTER — Encounter (HOSPITAL_COMMUNITY): Payer: Self-pay | Admitting: Internal Medicine

## 2021-05-24 ENCOUNTER — Ambulatory Visit (HOSPITAL_COMMUNITY): Payer: BC Managed Care – PPO | Admitting: Physical Therapy

## 2021-05-24 ENCOUNTER — Encounter (HOSPITAL_COMMUNITY)
Admission: EM | Disposition: A | Discharge status: Home Health | Payer: Self-pay | Source: Home / Self Care | Attending: Internal Medicine

## 2021-05-24 ENCOUNTER — Inpatient Hospital Stay (HOSPITAL_COMMUNITY): Payer: BC Managed Care – PPO | Admitting: Certified Registered Nurse Anesthetist

## 2021-05-24 DIAGNOSIS — E7800 Pure hypercholesterolemia, unspecified: Secondary | ICD-10-CM

## 2021-05-24 DIAGNOSIS — K2100 Gastro-esophageal reflux disease with esophagitis, without bleeding: Secondary | ICD-10-CM

## 2021-05-24 DIAGNOSIS — I10 Essential (primary) hypertension: Secondary | ICD-10-CM

## 2021-05-24 DIAGNOSIS — M869 Osteomyelitis, unspecified: Secondary | ICD-10-CM

## 2021-05-24 DIAGNOSIS — Z481 Encounter for planned postprocedural wound closure: Secondary | ICD-10-CM

## 2021-05-24 DIAGNOSIS — E440 Moderate protein-calorie malnutrition: Secondary | ICD-10-CM | POA: Insufficient documentation

## 2021-05-24 DIAGNOSIS — M86171 Other acute osteomyelitis, right ankle and foot: Secondary | ICD-10-CM

## 2021-05-24 HISTORY — PX: WOUND DEBRIDEMENT: SHX247

## 2021-05-24 LAB — GLUCOSE, CAPILLARY
Glucose-Capillary: 163 mg/dL — ABNORMAL HIGH (ref 70–99)
Glucose-Capillary: 201 mg/dL — ABNORMAL HIGH (ref 70–99)
Glucose-Capillary: 174 mg/dL — ABNORMAL HIGH (ref 70–99)
Glucose-Capillary: 150 mg/dL — ABNORMAL HIGH (ref 70–99)
Glucose-Capillary: 93 mg/dL (ref 70–99)

## 2021-05-24 LAB — CBC
HCT: 39.7 % (ref 39.0–52.0)
Hemoglobin: 13.4 g/dL (ref 13.0–17.0)
MCH: 28 pg (ref 26.0–34.0)
MCHC: 33.8 g/dL (ref 30.0–36.0)
MCV: 82.9 fL (ref 80.0–100.0)
Platelets: 406 10*3/uL — ABNORMAL HIGH (ref 150–400)
RBC: 4.79 MIL/uL (ref 4.22–5.81)
RDW: 12 % (ref 11.5–15.5)
WBC: 12.3 10*3/uL — ABNORMAL HIGH (ref 4.0–10.5)
nRBC: 0 % (ref 0.0–0.2)

## 2021-05-24 LAB — BASIC METABOLIC PANEL
Anion gap: 12 (ref 5–15)
BUN: 11 mg/dL (ref 6–20)
CO2: 23 mmol/L (ref 22–32)
Calcium: 9.7 mg/dL (ref 8.9–10.3)
Chloride: 99 mmol/L (ref 98–111)
Creatinine, Ser: 0.65 mg/dL (ref 0.61–1.24)
GFR, Estimated: >60 mL/min (ref >60)
Glucose, Bld: 209 mg/dL — ABNORMAL HIGH (ref 70–99)
Potassium: 3.9 mmol/L (ref 3.5–5.1)
Sodium: 134 mmol/L — ABNORMAL LOW (ref 135–145)

## 2021-05-24 LAB — CREATININE, SERUM
Creatinine, Ser: 0.6 mg/dL — ABNORMAL LOW (ref 0.61–1.24)
GFR, Estimated: >60 mL/min (ref >60)

## 2021-05-24 LAB — VANCOMYCIN, PEAK: Vancomycin Pk: 38 ug/mL (ref 30–40)

## 2021-05-24 LAB — VANCOMYCIN, TROUGH: Vancomycin Tr: 16 ug/mL (ref 15–20)

## 2021-05-24 LAB — SURGICAL PATHOLOGY

## 2021-05-24 SURGERY — DEBRIDEMENT, WOUND
Anesthesia: General | Site: Foot | Laterality: Right

## 2021-05-24 MED ORDER — FENTANYL CITRATE (PF) 100 MCG/2ML IJ SOLN: 25.0000 ug | INTRAMUSCULAR | Status: DC | PRN

## 2021-05-24 MED ORDER — INSULIN ASPART 100 UNIT/ML IJ SOLN
4.0000 [IU] | Freq: Three times a day (TID) | INTRAMUSCULAR | Status: DC
  Administered 2021-05-24 – 2021-05-26 (×8): 4 [IU] via SUBCUTANEOUS

## 2021-05-24 MED ORDER — ONDANSETRON HCL 4 MG/2ML IJ SOLN
INTRAMUSCULAR | Status: DC | PRN
  Administered 2021-05-24: 4 mg via INTRAVENOUS

## 2021-05-24 MED ORDER — SODIUM CHLORIDE 0.9 % IR SOLN
Status: DC | PRN
  Administered 2021-05-24: 3000 mL

## 2021-05-24 MED ORDER — BUPIVACAINE HCL (PF) 0.5 % IJ SOLN
INTRAMUSCULAR | Status: AC
  Filled 2021-05-24: qty 30

## 2021-05-24 MED ORDER — PHENYLEPHRINE 40 MCG/ML (10ML) SYRINGE FOR IV PUSH (FOR BLOOD PRESSURE SUPPORT)
PREFILLED_SYRINGE | INTRAVENOUS | Status: DC | PRN
  Administered 2021-05-24 (×2): 80 ug via INTRAVENOUS

## 2021-05-24 MED ORDER — LACTATED RINGERS IV SOLN: INTRAVENOUS | Status: DC

## 2021-05-24 MED ORDER — LIDOCAINE 2% (20 MG/ML) 5 ML SYRINGE
INTRAMUSCULAR | Status: AC
  Filled 2021-05-24: qty 5

## 2021-05-24 MED ORDER — CHLORHEXIDINE GLUCONATE 0.12 % MT SOLN
15.0000 mL | Freq: Once | OROMUCOSAL | Status: AC
Start: 2021-05-24 — End: 2021-05-24

## 2021-05-24 MED ORDER — MIDAZOLAM HCL 2 MG/2ML IJ SOLN
INTRAMUSCULAR | Status: AC
  Filled 2021-05-24: qty 2

## 2021-05-24 MED ORDER — VANCOMYCIN HCL 1000 MG IV SOLR
INTRAVENOUS | Status: AC
  Filled 2021-05-24: qty 1000

## 2021-05-24 MED ORDER — SODIUM CHLORIDE 0.9 % IV SOLN: INTRAVENOUS | Status: DC | PRN

## 2021-05-24 MED ORDER — ORAL CARE MOUTH RINSE: 15.0000 mL | Freq: Once | OROMUCOSAL | Status: AC

## 2021-05-24 MED ORDER — PHENYLEPHRINE 40 MCG/ML (10ML) SYRINGE FOR IV PUSH (FOR BLOOD PRESSURE SUPPORT)
PREFILLED_SYRINGE | INTRAVENOUS | Status: AC
  Filled 2021-05-24: qty 10

## 2021-05-24 MED ORDER — PHENYLEPHRINE 40 MCG/ML (10ML) SYRINGE FOR IV PUSH (FOR BLOOD PRESSURE SUPPORT)
PREFILLED_SYRINGE | INTRAVENOUS | Status: AC
  Filled 2021-05-24: qty 50

## 2021-05-24 MED ORDER — INSULIN GLARGINE 100 UNIT/ML ~~LOC~~ SOLN
10.0000 [IU] | Freq: Once | SUBCUTANEOUS | Status: AC
  Administered 2021-05-24: 10 [IU] via SUBCUTANEOUS
  Filled 2021-05-24: qty 0.1

## 2021-05-24 MED ORDER — ONDANSETRON HCL 4 MG/2ML IJ SOLN
INTRAMUSCULAR | Status: AC
  Filled 2021-05-24: qty 2

## 2021-05-24 MED ORDER — PROPOFOL 10 MG/ML IV BOLUS
INTRAVENOUS | Status: DC | PRN
  Administered 2021-05-24: 150 mg via INTRAVENOUS

## 2021-05-24 MED ORDER — BUPIVACAINE HCL (PF) 0.5 % IJ SOLN
INTRAMUSCULAR | Status: DC | PRN
  Administered 2021-05-24: 10 mL

## 2021-05-24 MED ORDER — PROPOFOL 10 MG/ML IV BOLUS
INTRAVENOUS | Status: AC
  Filled 2021-05-24: qty 20

## 2021-05-24 MED ORDER — VANCOMYCIN HCL 1000 MG/200ML IV SOLN
1000.0000 mg | Freq: Three times a day (TID) | INTRAVENOUS | Status: DC
  Administered 2021-05-24 – 2021-05-26 (×5): 1000 mg via INTRAVENOUS
  Filled 2021-05-24 (×6): qty 200

## 2021-05-24 MED ORDER — FENTANYL CITRATE (PF) 100 MCG/2ML IJ SOLN
INTRAMUSCULAR | Status: DC | PRN
  Administered 2021-05-24 (×2): 50 ug via INTRAVENOUS

## 2021-05-24 MED ORDER — VANCOMYCIN HCL 1000 MG IV SOLR
INTRAVENOUS | Status: DC | PRN
  Administered 2021-05-24: 1000 mg via TOPICAL

## 2021-05-24 MED ORDER — MIDAZOLAM HCL 5 MG/5ML IJ SOLN
INTRAMUSCULAR | Status: DC | PRN
  Administered 2021-05-24: 2 mg via INTRAVENOUS

## 2021-05-24 MED ORDER — 0.9 % SODIUM CHLORIDE (POUR BTL) OPTIME
TOPICAL | Status: DC | PRN
  Administered 2021-05-24: 1000 mL

## 2021-05-24 MED ORDER — LIDOCAINE 2% (20 MG/ML) 5 ML SYRINGE
INTRAMUSCULAR | Status: DC | PRN
  Administered 2021-05-24: 100 mg via INTRAVENOUS

## 2021-05-24 MED ORDER — FENTANYL CITRATE (PF) 250 MCG/5ML IJ SOLN
INTRAMUSCULAR | Status: AC
  Filled 2021-05-24: qty 5

## 2021-05-24 MED ORDER — CHLORHEXIDINE GLUCONATE 0.12 % MT SOLN
OROMUCOSAL | Status: AC
  Administered 2021-05-24: 15 mL via OROMUCOSAL
  Filled 2021-05-24: qty 15

## 2021-05-24 MED ORDER — PROMETHAZINE HCL 25 MG/ML IJ SOLN: 6.2500 mg | INTRAMUSCULAR | Status: DC | PRN

## 2021-05-24 SURGICAL SUPPLY — 46 items
APL PRP STRL LF DISP 70% ISPRP (MISCELLANEOUS) ×1
BNDG CMPR 9X4 STRL LF SNTH (GAUZE/BANDAGES/DRESSINGS)
BNDG ELASTIC 4X5.8 VLCR STR LF (GAUZE/BANDAGES/DRESSINGS) ×2 IMPLANT
BNDG ESMARK 4X9 LF (GAUZE/BANDAGES/DRESSINGS) IMPLANT
BNDG GAUZE ELAST 4 BULKY (GAUZE/BANDAGES/DRESSINGS) ×3 IMPLANT
CHLORAPREP W/TINT 26 (MISCELLANEOUS) ×3 IMPLANT
COVER SURGICAL LIGHT HANDLE (MISCELLANEOUS) ×3 IMPLANT
COVER WAND RF STERILE (DRAPES) ×3 IMPLANT
CUFF TOURN SGL QUICK 18X4 (TOURNIQUET CUFF) IMPLANT
CUFF TOURN SGL QUICK 34 (TOURNIQUET CUFF)
CUFF TRNQT CYL 34X4.125X (TOURNIQUET CUFF) IMPLANT
DRAPE U-SHAPE 47X51 STRL (DRAPES) ×3 IMPLANT
DRSG XEROFORM 1X8 (GAUZE/BANDAGES/DRESSINGS) ×2 IMPLANT
ELECT CAUTERY BLADE 6.4 (BLADE) ×3 IMPLANT
ELECT REM PT RETURN 9FT ADLT (ELECTROSURGICAL) ×3
ELECTRODE REM PT RTRN 9FT ADLT (ELECTROSURGICAL) ×1 IMPLANT
GAUZE SPONGE 4X4 12PLY STRL (GAUZE/BANDAGES/DRESSINGS) ×2 IMPLANT
GLOVE BIO SURGEON STRL SZ7.5 (GLOVE) ×3 IMPLANT
GLOVE SRG 8 PF TXTR STRL LF DI (GLOVE) ×1 IMPLANT
GLOVE SURG UNDER POLY LF SZ8 (GLOVE) ×3
GOWN STRL REUS W/ TWL LRG LVL3 (GOWN DISPOSABLE) ×1 IMPLANT
GOWN STRL REUS W/ TWL XL LVL3 (GOWN DISPOSABLE) ×1 IMPLANT
GOWN STRL REUS W/TWL LRG LVL3 (GOWN DISPOSABLE) ×3
GOWN STRL REUS W/TWL XL LVL3 (GOWN DISPOSABLE) ×3
KIT BASIN OR (CUSTOM PROCEDURE TRAY) ×3 IMPLANT
KIT TURNOVER KIT B (KITS) ×3 IMPLANT
MANIFOLD NEPTUNE II (INSTRUMENTS) ×3 IMPLANT
MICROMATRIX 500MG (Tissue) ×3 IMPLANT
NDL BIOPSY JAMSHIDI 8X6 (NEEDLE) IMPLANT
NEEDLE BIOPSY JAMSHIDI 8X6 (NEEDLE) IMPLANT
NEEDLE HYPO 25GX1X1/2 BEV (NEEDLE) ×3 IMPLANT
NS IRRIG 1000ML POUR BTL (IV SOLUTION) ×3 IMPLANT
PACK ORTHO EXTREMITY (CUSTOM PROCEDURE TRAY) ×3 IMPLANT
PAD ARMBOARD 7.5X6 YLW CONV (MISCELLANEOUS) ×2 IMPLANT
PROBE DEBRIDE SONICVAC MISONIX (TIP) IMPLANT
SET CYSTO W/LG BORE CLAMP LF (SET/KITS/TRAYS/PACK) ×3 IMPLANT
SOL PREP POV-IOD 4OZ 10% (MISCELLANEOUS) ×6 IMPLANT
SOLUTION PARTIC MCRMTRX 500MG (Tissue) IMPLANT
SUT ETHILON 3 0 PS 1 (SUTURE) ×2 IMPLANT
SUT MNCRL AB 3-0 PS2 27 (SUTURE) ×2 IMPLANT
SYR CONTROL 10ML LL (SYRINGE) ×2 IMPLANT
TOWEL GREEN STERILE (TOWEL DISPOSABLE) ×3 IMPLANT
TOWEL GREEN STERILE FF (TOWEL DISPOSABLE) ×3 IMPLANT
TUBE CONNECTING 12'X1/4 (SUCTIONS) ×1
TUBE CONNECTING 12X1/4 (SUCTIONS) ×2 IMPLANT
YANKAUER SUCT BULB TIP NO VENT (SUCTIONS) ×3 IMPLANT

## 2021-05-24 NOTE — Transfer of Care (Signed)
Immediate Anesthesia Transfer of Care Note  Patient: [name]  Procedure(s) Performed: RIGHT FOOT WOUND DEBRIDEMENT AND CLOSURE (Right Foot)  Patient Location: PACU  Anesthesia Type:General  Level of Consciousness: awake, alert  and oriented  Airway & Oxygen Therapy: Patient Spontanous Breathing and Patient connected to nasal cannula oxygen  Post-op Assessment: Report given to RN and Post -op Vital signs reviewed and stable  Post vital signs: Reviewed and stable  Last Vitals:  Vitals Value Taken Time  BP 134/91 05/24/21 1639  Temp    Pulse 89 05/24/21 1643  Resp 14 05/24/21 1643  SpO2 98 % 05/24/21 1643  Vitals shown include unvalidated device data.  Last Pain:  Vitals:   05/24/21 1543  TempSrc: Oral  PainSc: 4       Patients Stated Pain Goal: 2 (40/08/67 6195)  Complications: No complications documented.

## 2021-05-24 NOTE — Progress Notes (Signed)
[facility]                                                                              Patient Demographics  [name], is a 25 y.o. male, DOB - 08/21/1996, XFG:[id number]  Admit date - 05/20/2021   Admitting Physician Barton Dubois, MD  Outpatient Primary MD for the patient is Zhou-Talbert, Elwyn Lade, MD  Outpatient specialists:   LOS - 4  days   Medical records reviewed and are as summarized below:    Chief Complaint  Patient presents with  . Foot Ulcer       Brief summary   Patient is a 25 year old male with history of hypertension, diabetes, hyperlipidemia, mild LE arterial insufficiency, neuropathy, chronic right foot wound presented to ED secondary to increased drainage, swelling and redness on the lateral aspect of his right foot.  Patient reported that symptoms have been present for last 2 to 3 days prior to admission and were progressing. Right foot x-ray showed osteomyelitis/septic joint involving the fifth metatarsal head and base of the fifth proximal phalanx MRI right foot showed large soft tissue wound overlying the fifth metatarsal head.  Osteomyelitis of the fifth metatarsal head and base of the fifth proximal phalanx.  Complex fluid collection around the fifth MTP joint measuring 2.9x 2.5x 1.6 cm extending from the plantar aspect of the dorsal aspect consistent with an abscess. 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  Active Problems:   HTN (hypertension) -Continue Coreg, Imdur -BP currently stable    Uncontrolled type 1 diabetes mellitus with hyperglycemia ([place]), IDDM with osteomyelitis -Uncontrolled, CBGs in  200s -Continue Lantus 20 mg twice daily, moderate sliding scale insulin.  Given n.p.o. after midnight, will give Lantus half dose this morning -Add meal coverage 4 units TID AC     HLD (hyperlipidemia) -Continue statin    Malnutrition of moderate degree secondary to chronic illness/diabetes Estimated body mass index is 26.87 kg/m as calculated from the following:   Height as of this encounter: 6\' 3"  (1.905 m).   Weight as of this encounter: 97.5 kg. -Dietitian consult, nutritional supplements  Code Status: Full CODE STATUS DVT Prophylaxis:  enoxaparin (LOVENOX) injection 40 mg Start: 05/20/21 2200   Level of Care: Level of care: Med-Surg Family Communication: Discussed all imaging results, lab results, explained to the patient    Disposition Plan:     Status is: Inpatient  Remains inpatient appropriate because:Inpatient level of care appropriate due to severity of illness   Dispo: The patient is from: Home              Anticipated d/c is to: Home              Patient currently is not medically stable to d/c.  Undergoing the OR today   Difficult to place patient No  Time Spent in minutes   35 minutes  Procedures:  5/29 ray amputation fifth right toe  Consultants:   Dr March Rummage   Antimicrobials:   Anti-infectives (From admission, onward)   Start     Dose/Rate Route Frequency Ordered Stop   05/21/21 1400  vancomycin (VANCOREADY) IVPB 1500 mg/300 mL        1,500 mg 150 mL/hr over 120 Minutes Intravenous Every 8 hours 05/21/21 1030     05/21/21 1300  cefTRIAXone (ROCEPHIN) 2 g in sodium chloride 0.9 % 100 mL IVPB        2 g 200 mL/hr over 30 Minutes Intravenous Every 24 hours 05/20/21 1308     05/20/21 2200  vancomycin (VANCOCIN) IVPB 1000 mg/200 mL premix  Status:  Discontinued        1,000 mg 200 mL/hr over 60 Minutes Intravenous Every 8 hours 05/20/21 1303 05/21/21 1030   05/20/21 1245  vancomycin (VANCOREADY) IVPB 2000 mg/400 mL        2,000 mg 200 mL/hr  over 120 Minutes Intravenous  Once 05/20/21 1233 05/20/21 1534   05/20/21 1245  cefTRIAXone (ROCEPHIN) 2 g in sodium chloride 0.9 % 100 mL IVPB        2 g 200 mL/hr over 30 Minutes Intravenous  Once 05/20/21 1243 05/20/21 1327   05/20/21 1215  vancomycin (VANCOCIN) IVPB 1000 mg/200 mL premix  Status:  Discontinued        1,000 mg 200 mL/hr over 60 Minutes Intravenous  Once 05/20/21 1214 05/20/21 1233   05/20/21 1215  aztreonam (AZACTAM) 2 g in sodium chloride 0.9 % 100 mL IVPB  Status:  Discontinued        2 g 200 mL/hr over 30 Minutes Intravenous  Once 05/20/21 1214 05/20/21 1249          Medications  Scheduled Meds: . aspirin EC  81 mg Oral Daily  . atorvastatin  10 mg Oral q1800  . carvedilol  25 mg Oral BID  . enoxaparin (LOVENOX) injection  40 mg Subcutaneous Q24H  . feeding supplement (GLUCERNA SHAKE)  237 mL Oral TID BM  . insulin aspart  0-15 Units Subcutaneous TID WC  . insulin aspart  0-5 Units Subcutaneous QHS  . insulin glargine  20 Units Subcutaneous BID  . isosorbide mononitrate  30 mg Oral Daily  . living well with diabetes book   Does not apply Once  . nutrition supplement (JUVEN)  1 packet Oral BID BM  . pantoprazole  40 mg Oral Daily   Continuous Infusions: . cefTRIAXone (ROCEPHIN)  IV 2 g (05/24/21 1230)  . vancomycin 1,500 mg (05/24/21 0610)   PRN Meds:.acetaminophen **OR** acetaminophen, HYDROcodone-acetaminophen, hydrOXYzine, ondansetron **OR** ondansetron (ZOFRAN) IV, phenol, zolpidem      Subjective:   [name] was seen and examined today.  Sitting on the edge of the bed, no acute complaints.  Awaiting surgery today.  Pain in the right foot is controlled.  Patient denies dizziness, chest pain, shortness of breath, abdominal pain, N/V/D/C.  No acute events overnight, no fevers  Objective:   Vitals:   05/23/21 0801 05/23/21 1436 05/23/21 2012 05/24/21 0724  BP: 126/79 125/72 (!) 149/86 (!) 142/97  Pulse: 93 94 93 88  Resp: 16 14  16    Temp: 97.9 F (36.6 C) 98 F (36.7 C) 97.7 F (36.5 C) 98.6 F (37 C)  TempSrc: Oral Oral Oral Oral  SpO2: 99% 96% 100% 100%  Weight:      Height:  Intake/Output Summary (Last 24 hours) at 05/24/2021 1351 Last data filed at 05/24/2021 1300 Gross per 24 hour  Intake 0 ml  Output --  Net 0 ml     Wt Readings from Last 3 Encounters:  05/21/21 97.5 kg  03/27/21 98.8 kg  02/06/21 97.5 kg     Exam  General: Alert and oriented x 3, NAD  Cardiovascular: S1 S2 auscultated, no murmurs, RRR  Respiratory: CTA B  Gastrointestinal: Soft, nontender, nondistended, + bowel sounds  Ext: right foot dressing intact  Neuro: no new deficits  Musculoskeletal: No digital cyanosis, clubbing  Skin: Right foot dressing intact  Psych: Normal affect and demeanor, alert and oriented x3    Data Reviewed:  I have personally reviewed following labs and imaging studies  Micro Results Recent Results (from the past 240 hour(s))  Resp Panel by RT-PCR (Flu A&B, Covid) Nasopharyngeal Swab     Status: None   Collection Time: 05/20/21 12:46 PM   Specimen: Nasopharyngeal Swab; Nasopharyngeal(NP) swabs in vial transport medium  Result Value Ref Range Status   SARS Coronavirus 2 by RT PCR NEGATIVE NEGATIVE Final    Comment: (NOTE) SARS-CoV-2 target nucleic acids are NOT DETECTED.  The SARS-CoV-2 RNA is generally detectable in upper respiratory specimens during the acute phase of infection. The lowest concentration of SARS-CoV-2 viral copies this assay can detect is 138 copies/mL. A negative result does not preclude SARS-Cov-2 infection and should not be used as the sole basis for treatment or other patient management decisions. A negative result may occur with  improper specimen collection/handling, submission of specimen other than nasopharyngeal swab, presence of viral mutation(s) within the areas targeted by this assay, and inadequate number of viral copies(<138 copies/mL). A negative  result must be combined with clinical observations, patient history, and epidemiological information. The expected result is Negative.  Fact Sheet for Patients:  EntrepreneurPulse.com.au  Fact Sheet for Healthcare Providers:  IncredibleEmployment.be  This test is no t yet approved or cleared by the [place] FDA and  has been authorized for detection and/or diagnosis of SARS-CoV-2 by FDA under an Emergency Use Authorization (EUA). This EUA will remain  in effect (meaning this test can be used) for the duration of the COVID-19 declaration under Section 564(b)(1) of the Act, 21 U.S.C.section 360bbb-3(b)(1), unless the authorization is terminated  or revoked sooner.       Influenza A by PCR NEGATIVE NEGATIVE Final   Influenza B by PCR NEGATIVE NEGATIVE Final    Comment: (NOTE) The Xpert Xpress SARS-CoV-2/FLU/RSV plus assay is intended as an aid in the diagnosis of influenza from Nasopharyngeal swab specimens and should not be used as a sole basis for treatment. Nasal washings and aspirates are unacceptable for Xpert Xpress SARS-CoV-2/FLU/RSV testing.  Fact Sheet for Patients: EntrepreneurPulse.com.au  Fact Sheet for Healthcare Providers: IncredibleEmployment.be  This test is not yet approved or cleared by the [place] FDA and has been authorized for detection and/or diagnosis of SARS-CoV-2 by FDA under an Emergency Use Authorization (EUA). This EUA will remain in effect (meaning this test can be used) for the duration of the COVID-19 declaration under Section 564(b)(1) of the Act, 21 U.S.C. section 360bbb-3(b)(1), unless the authorization is terminated or revoked.  Performed at [facility], [address], [place], [place] [address]   Culture, blood (routine x 2)     Status: None (Preliminary result)   Collection Time: 05/20/21  1:24 PM   Specimen: BLOOD LEFT ARM  Result Value Ref Range Status  Specimen Description BLOOD LEFT ARM BOTTLES DRAWN AEROBIC AND ANAEROBIC  Final   Special Requests Blood Culture adequate volume  Final   Culture   Final    NO GROWTH 4 DAYS Performed at [facility], [address], [place], [place] [address]    Report Status PENDING  Incomplete  Culture, blood (routine x 2)     Status: None (Preliminary result)   Collection Time: 05/20/21  1:24 PM   Specimen: BLOOD LEFT HAND  Result Value Ref Range Status   Specimen Description   Final    BLOOD LEFT HAND BOTTLES DRAWN AEROBIC AND ANAEROBIC   Special Requests Blood Culture adequate volume  Final   Culture   Final    NO GROWTH 4 DAYS Performed at [facility], [address] [place], [place] [address]    Report Status PENDING  Incomplete  Surgical pcr screen     Status: None   Collection Time: 05/20/21  8:29 PM   Specimen: Nasal Mucosa; Nasal Swab  Result Value Ref Range Status   MRSA, PCR NEGATIVE NEGATIVE Final   Staphylococcus aureus NEGATIVE NEGATIVE Final    Comment: (NOTE) The Xpert SA Assay (FDA approved for NASAL specimens in patients 63 years of age and older), is one component of a comprehensive surveillance program. It is not intended to diagnose infection nor to guide or monitor treatment. Performed at [facility], [place] [address], [place], [place] [address]   Aerobic/Anaerobic Culture w Gram Stain (surgical/deep wound)     Status: None (Preliminary result)   Collection Time: 05/21/21  3:10 PM   Specimen: PATH Other; Tissue  Result Value Ref Range Status   Specimen Description WOUND  Final   Special Requests WOUND CULTURE OF 5TH METATARSAL RIGHT SPEC A  Final   Gram Stain   Final    NO WBC SEEN NO ORGANISMS SEEN Performed at [facility], [place] [address] [place], [place] [address]    Culture   Final    FEW METHICILLIN RESISTANT STAPHYLOCOCCUS AUREUS NO ANAEROBES ISOLATED; CULTURE IN PROGRESS FOR 5 DAYS    Report Status PENDING  Incomplete   Organism ID,  Bacteria METHICILLIN RESISTANT STAPHYLOCOCCUS AUREUS  Final      Susceptibility   Methicillin resistant staphylococcus aureus - MIC*    CIPROFLOXACIN >=8 RESISTANT Resistant     ERYTHROMYCIN >=8 RESISTANT Resistant     GENTAMICIN <=0.5 SENSITIVE Sensitive     OXACILLIN >=4 RESISTANT Resistant     TETRACYCLINE <=1 SENSITIVE Sensitive     VANCOMYCIN 1 SENSITIVE Sensitive     TRIMETH/SULFA <=10 SENSITIVE Sensitive     CLINDAMYCIN <=0.25 SENSITIVE Sensitive     RIFAMPIN <=0.5 SENSITIVE Sensitive     Inducible Clindamycin NEGATIVE Sensitive     * FEW METHICILLIN RESISTANT STAPHYLOCOCCUS AUREUS  Aerobic/Anaerobic Culture w Gram Stain (surgical/deep wound)     Status: None (Preliminary result)   Collection Time: 05/21/21  3:12 PM   Specimen: PATH Other; Tissue  Result Value Ref Range Status   Specimen Description BONE  Final   Special Requests BONE CULTURE OF 4TH METATARSAL RIGHT SPEC B  Final   Gram Stain   Final    FEW WBC PRESENT, PREDOMINANTLY MONONUCLEAR NO ORGANISMS SEEN    Culture   Final    NO GROWTH 3 DAYS NO ANAEROBES ISOLATED; CULTURE IN PROGRESS FOR 5 DAYS Performed at [facility], [address] [place], [place] [address]    Report Status PENDING  Incomplete    Radiology Reports MR FOOT RIGHT W WO CONTRAST  Result Date: 05/21/2021 CLINICAL DATA:  Worsening infection and drainage of the right foot. EXAM: MRI OF THE RIGHT FOREFOOT WITHOUT AND WITH CONTRAST TECHNIQUE: Multiplanar, multisequence MR imaging of the right forefoot was performed before and after the administration of intravenous contrast. CONTRAST:  55mL GADAVIST GADOBUTROL 1 MMOL/ML IV SOLN COMPARISON:  None. FINDINGS: Bones/Joint/Cartilage Large soft tissue wound overlying the fifth metatarsal head. Cortical destruction of the fifth metatarsal head with extensive bone marrow edema throughout the first metatarsal head and extending into the shaft most consistent with osteomyelitis. Bone destruction at the base  of the fifth proximal phalanx with associated bone marrow edema consistent with osteomyelitis. Complex fluid collection centered around the fifth MTP joint measuring 2.9 x 2.5 x 1.6 cm extending from the plantar aspect to the dorsal aspect consistent with an abscess. Bone marrow edema in the fifth distal phalanx which may be reactive versus secondary to osteomyelitis. Marrow edema in the head of fourth proximal phalanx without cortical destruction may reflect reactive edema versus early osteomyelitis. No acute fracture or dislocation. Normal alignment. No joint effusion. Ligaments Collateral ligaments are intact.  Lisfranc ligament is intact. Muscles and Tendons Flexor, peroneal and extensor compartment tendons are intact. Diffuse T2 hyperintense signal throughout the plantar musculature likely neurogenic. An element of myositis is not completely excluded. Soft tissue No other fluid collection or hematoma. No soft tissue mass. Severe soft tissue edema along the dorsal aspect of the foot concerning for cellulitis. IMPRESSION: 1. Large soft tissue wound overlying the fifth metatarsal head. Osteomyelitis of the fifth metatarsal head and base of the fifth proximal phalanx. Complex fluid collection centered around the fifth MTP joint measuring 2.9 x 2.5 x 1.6 cm extending from the plantar aspect to the dorsal aspect consistent with an abscess. Bone marrow edema in the fifth distal phalanx which may be reactive versus secondary to osteomyelitis. 2. Marrow edema in the head of fourth proximal phalanx without cortical destruction may reflect reactive edema versus early osteomyelitis. 3. Severe soft tissue edema along the dorsal aspect of the foot concerning for cellulitis. Electronically Signed   By: Kathreen Devoid   On: 05/21/2021 10:58   DG Foot 2 Views Right  Result Date: 05/21/2021 CLINICAL DATA:  Status post surgery for osteomyelitis. EXAM: RIGHT FOOT - 2 VIEW COMPARISON:  05/20/2021. FINDINGS: The fifth toe and the  fifth metatarsal from the midshaft distally have been resected. The resected end of the remaining fifth metatarsal is well-defined. Small focus of lucency with resorption of the overlying cortex, along the distal, lateral aspect of the proximal phalanx of the fourth toe. This is not evident on the previous day's study, which would argue against osteomyelitis. Still, a small focus of osteomyelitis is possible. There is evidence of a subtle overlying soft tissue defect. There are no additional areas of bone lucency or resorption to suggest osteomyelitis. Joints are normally spaced and aligned. No soft tissue air. IMPRESSION: 1. Status post resection of the fifth toe and the middle through distal aspect of the fifth metatarsal. 2. Small focus of lucency in the distal lateral aspect of the proximal phalanx of the fourth toe. This could potentially reflect a small focus osteomyelitis. It may be related to the recent surgery. Recommend follow-up imaging in 4-6 weeks to reassess. Electronically Signed   By: Lajean Manes M.D.   On: 05/21/2021 16:42   DG Foot Complete Right  Result Date: 05/20/2021 CLINICAL DATA:  Wound to distal 5th metatarsal EXAM: RIGHT FOOT COMPLETE - 3+ VIEW COMPARISON:  12/21/2020 FINDINGS: Soft tissue wound/ulceration adjacent to the distal 5th metatarsal head. Adjacent cortical irregularity/destruction involving the 5th metatarsal head and likely the lateral base of the 5th proximal phalanx, suggesting osteomyelitis/septic joint. Mild lateral soft tissue swelling involving the 5th digit. IMPRESSION: Osteomyelitis/septic joint involving the 5th metatarsal head and base of the 5th proximal phalanx. Electronically Signed   By: Julian Hy M.D.   On: 05/20/2021 10:40    Lab Data:  CBC: Recent Labs  Lab 05/20/21 1011 05/24/21 0933  WBC 18.5* 12.3*  NEUTROABS 14.7*  --   HGB 14.0 13.4  HCT 42.0 39.7  MCV 83.7 82.9  PLT 303 169*   Basic Metabolic Panel: Recent Labs  Lab  05/20/21 1011 05/22/21 0050 05/23/21 0144 05/24/21 0156 05/24/21 0933  NA 130*  --   --   --  134*  K 4.1  --   --   --  3.9  CL 95*  --   --   --  99  CO2 25  --   --   --  23  GLUCOSE 396*  --   --   --  209*  BUN 11  --   --   --  11  CREATININE 0.72 0.66 0.70 0.60* 0.65  CALCIUM 9.7  --   --   --  9.7   GFR: Estimated Creatinine Clearance: 171.6 mL/min (by C-G formula based on SCr of 0.65 mg/dL). Liver Function Tests: No results for input(s): AST, ALT, ALKPHOS, BILITOT, PROT, ALBUMIN in the last 168 hours. No results for input(s): LIPASE, AMYLASE in the last 168 hours. No results for input(s): AMMONIA in the last 168 hours. Coagulation Profile: No results for input(s): INR, PROTIME in the last 168 hours. Cardiac Enzymes: No results for input(s): CKTOTAL, CKMB, CKMBINDEX, TROPONINI in the last 168 hours. BNP (last 3 results) No results for input(s): PROBNP in the last 8760 hours. HbA1C: No results for input(s): HGBA1C in the last 72 hours. CBG: Recent Labs  Lab 05/23/21 1123 05/23/21 1610 05/23/21 2014 05/24/21 0713 05/24/21 1115  GLUCAP 283* 118* 172* 163* 201*   Lipid Profile: No results for input(s): CHOL, HDL, LDLCALC, TRIG, CHOLHDL, LDLDIRECT in the last 72 hours. Thyroid Function Tests: No results for input(s): TSH, T4TOTAL, FREET4, T3FREE, THYROIDAB in the last 72 hours. Anemia Panel: No results for input(s): VITAMINB12, FOLATE, FERRITIN, TIBC, IRON, RETICCTPCT in the last 72 hours. Urine analysis:    Component Value Date/Time   COLORURINE STRAW (A) 08/10/2018 1425   APPEARANCEUR CLEAR 08/10/2018 1425   LABSPEC 1.035 (H) 08/10/2018 1425   PHURINE 7.0 08/10/2018 1425   GLUCOSEU >=500 (A) 08/10/2018 1425   HGBUR NEGATIVE 08/10/2018 1425   BILIRUBINUR NEGATIVE 08/10/2018 1425   KETONESUR 5 (A) 08/10/2018 1425   PROTEINUR NEGATIVE 08/10/2018 1425   NITRITE NEGATIVE 08/10/2018 1425   LEUKOCYTESUR NEGATIVE 08/10/2018 1425     Iyad Deroo M.D. [facility] 05/24/2021, 1:51 PM  Available via Epic secure chat 7am-7pm After 7 pm, please refer to night coverage provider listed on amion.

## 2021-05-24 NOTE — Anesthesia Procedure Notes (Signed)
Procedure Name: LMA Insertion Date/Time: 05/24/2021 4:00 PM Performed by: Alain Marion, CRNA Pre-anesthesia Checklist: Patient identified, Emergency Drugs available, Suction available and Patient being monitored Patient Re-evaluated:Patient Re-evaluated prior to induction Oxygen Delivery Method: Circle System Utilized Preoxygenation: Pre-oxygenation with 100% oxygen Induction Type: IV induction LMA: LMA inserted LMA Size: 4.0 Number of attempts: 1 Airway Equipment and Method: Bite block Placement Confirmation: positive ETCO2 Tube secured with: Tape Dental Injury: Teeth and Oropharynx as per pre-operative assessment

## 2021-05-24 NOTE — Anesthesia Preprocedure Evaluation (Signed)
Anesthesia Evaluation  Patient identified by MRN, date of birth, ID band Patient awake    Reviewed: Allergy & Precautions, NPO status , Patient's Chart, lab work & pertinent test results, reviewed documented beta blocker date and time   Airway Mallampati: II  TM Distance: >3 FB Neck ROM: Full    Dental  (+) Dental Advisory Given, Poor Dentition, Missing, Chipped   Pulmonary neg pulmonary ROS,    Pulmonary exam normal breath sounds clear to auscultation       Cardiovascular hypertension, Pt. on home beta blockers Normal cardiovascular exam Rhythm:Regular Rate:Normal  '21 TTE - There is mild chordal SAM in the setting of hyperdynamic LV with mild subvalvular gradient of 17 mmHg. No significant LV hypertrophy. EF is 65 to 70%.    Neuro/Psych  Headaches, PSYCHIATRIC DISORDERS Anxiety    GI/Hepatic Neg liver ROS, GERD  ,  Endo/Other  diabetes, Type 1, Insulin Dependent  Renal/GU negative Renal ROS     Musculoskeletal Right foot wound, hx of amputation; planned post-operative wound closure   Abdominal   Peds  Hematology negative hematology ROS (+)   Anesthesia Other Findings Day of surgery medications reviewed with the patient.  Reproductive/Obstetrics                             Anesthesia Physical Anesthesia Plan  ASA: III  Anesthesia Plan: General   Post-op Pain Management:    Induction: Intravenous  PONV Risk Score and Plan: 2 and Midazolam, Ondansetron and Dexamethasone  Airway Management Planned: LMA  Additional Equipment:   Intra-op Plan:   Post-operative Plan: Extubation in OR  Informed Consent: I have reviewed the patients History and Physical, chart, labs and discussed the procedure including the risks, benefits and alternatives for the proposed anesthesia with the patient or authorized representative who has indicated his/her understanding and acceptance.     Dental  advisory given  Plan Discussed with: CRNA  Anesthesia Plan Comments:         Anesthesia Quick Evaluation

## 2021-05-24 NOTE — Progress Notes (Signed)
Pharmacy Antibiotic Note  [name] is a 25 y.o. male admitted on 05/20/2021 with R foot osteomyelitis/septic joint, now s/p partial 5th ray amputation. Pharmacy has been consulted for vancomycin dosing.  Patient is also on Rocephin.  Plan for I&D with possible wound closure on 6/1.  5th MT wound culture grew MRSA.  Renal function stable and vancomycin AUC is elevated at 676 (goal 400-550).  Afebrile, WBC was at 18.5.  Plan: Reduce vanc to 1gm IV Q8H for AUC 449 CTX 2gm IV Q24H per MD Monitor renal fxn, clinical progress, repeat vanc levels as indicated F/u with abx de-escalation  Height: 6\' 3"  (190.5 cm) Weight: 97.5 kg (214 lb 15.9 oz) IBW/kg (Calculated) : 84.5  Temp (24hrs), Avg:98.2 F (36.8 C), Min:97.7 F (36.5 C), Max:98.6 F (37 C)  Recent Labs  Lab 05/20/21 1011 05/20/21 1324 05/22/21 0050 05/23/21 0144 05/24/21 0156 05/24/21 0902 05/24/21 0933 05/24/21 1356  WBC 18.5*  --   --   --   --   --  12.3*  --   CREATININE 0.72  --  0.66 0.70 0.60*  --  0.65  --   LATICACIDVEN 1.3 1.5  --   --   --   --   --   --   VANCOTROUGH  --   --   --   --   --   --   --  16  VANCOPEAK  --   --   --   --   --  38  --   --     Estimated Creatinine Clearance: 171.6 mL/min (by C-G formula based on SCr of 0.65 mg/dL).    Allergies  Allergen Reactions  . Amoxicillin Hives    Has patient had a PCN reaction causing immediate rash, facial/tongue/throat swelling, SOB or lightheadedness with hypotension: No Has patient had a PCN reaction causing severe rash involving mucus membranes or skin necrosis: No Has patient had a PCN reaction that required hospitalization: No Has patient had a PCN reaction occurring within the last 10 years: No If all of the above answers are "NO", then may proceed with Cephalosporin use.     Vanc 5/28 >> CTX 5/28>> Azactam x1 5/28  6/1 VP/VT 38/16, AUC 676 on 1500mg  q8 >> decr to 1g q8 for AUC 449  5/28 surgical screen PCR -  negative 5/28 BCx - NGTD 5/29 bone cx 4th MT, spec B - NGTD 5/29 wound 5th MT, spec A - Staph aureus  [name], PharmD, BCPS, [place] 05/24/2021, 3:08 PM

## 2021-05-24 NOTE — Op Note (Signed)
  Patient Name: [name] DOB: June 16, 1996  MRN: [id number]   Date of Surgery: 05/24/21  Surgeon: Dr. Hardie Pulley, DPM Assistants:   Pre-operative Diagnosis:  Ulcer right foot with ostoemyelitis Post-operative Diagnosis:  Same Procedures:  1) Right foot secondary closure of wound - complex. Pathology/Specimens: None Anesthesia: MAC Hemostasis: * No tourniquets in log * Estimated Blood Loss: 5 mL Materials:  Implant Name Type Inv. Item Serial No. Manufacturer Lot No. LRB No. Used Action  MICROMATRIX 500MG - [id number] Tissue MICROMATRIX 500MG [id number] Right 1 Implanted   Medications: 1g vancomycin powder Complications: None  Indications for Procedure:  This is a 25 y.o. male who previously underwent right foot partial 5th ray resection. He presents today for planned closure   Procedure in Detail: Patient was identified in pre-operative holding area. Formal consent was signed and the right lower extremity was marked. Patient was brought back to the operating room. Anesthesia was induced. The extremity was prepped and draped in the usual sterile fashion. Timeout was taken to confirm patient name, laterality, and procedure prior to incision.   Attention was then directed to the right foot. There was a large wound measuring 7x2x3. The wound was down to bone. The wound was copiously irrigated with 2L of NS. The wound was debrided with a rongeur, and irrigated for a final 1L of NS. The wound was then closed in layers. 3-0 monocryl was utilized for deep closure and to cover the 5th metatarsal bone. The bone did appear healthy and viable. The wound was further closed in layers with 3-0 monocryl, 4-0 nylon, and skin staples.  The foot was then dressed with xeroform, betadine, 4x4, kerlix, and ACE bandage. Patient tolerated the procedure well.  Disposition: Following a period of post-operative monitoring, patient will be transferred home.

## 2021-05-24 NOTE — Progress Notes (Signed)
Bedside shift report complete. Received patient awake,alert/orientedx4 and able to verbalize needs. NAD noted; respirations easy/even on room air. Dressing to R foot c/d/i; sensation intact and able to wiggle toes. Personal knee brace in place to RLE. 3D walking boot noted to RLE. Movement/sensation to all extremities present. Whiteboard updated. All safety measures in place and personal belongings within reach.

## 2021-05-24 NOTE — Plan of Care (Signed)

## 2021-05-25 ENCOUNTER — Ambulatory Visit (HOSPITAL_COMMUNITY): Payer: BC Managed Care – PPO | Admitting: Physical Therapy

## 2021-05-25 ENCOUNTER — Inpatient Hospital Stay: Payer: Self-pay

## 2021-05-25 ENCOUNTER — Encounter (HOSPITAL_COMMUNITY): Payer: Self-pay | Admitting: Podiatry

## 2021-05-25 LAB — BASIC METABOLIC PANEL
Anion gap: 11 (ref 5–15)
BUN: 11 mg/dL (ref 6–20)
CO2: 26 mmol/L (ref 22–32)
Calcium: 9.4 mg/dL (ref 8.9–10.3)
Chloride: 98 mmol/L (ref 98–111)
Creatinine, Ser: 0.6 mg/dL — ABNORMAL LOW (ref 0.61–1.24)
GFR, Estimated: >60 mL/min (ref >60)
Glucose, Bld: 147 mg/dL — ABNORMAL HIGH (ref 70–99)
Potassium: 3.4 mmol/L — ABNORMAL LOW (ref 3.5–5.1)
Sodium: 135 mmol/L (ref 135–145)

## 2021-05-25 LAB — CULTURE, BLOOD (ROUTINE X 2)
Culture: NO GROWTH
Culture: NO GROWTH
Special Requests: ADEQUATE
Special Requests: ADEQUATE

## 2021-05-25 LAB — GLUCOSE, CAPILLARY
Glucose-Capillary: 154 mg/dL — ABNORMAL HIGH (ref 70–99)
Glucose-Capillary: 171 mg/dL — ABNORMAL HIGH (ref 70–99)
Glucose-Capillary: 144 mg/dL — ABNORMAL HIGH (ref 70–99)
Glucose-Capillary: 105 mg/dL — ABNORMAL HIGH (ref 70–99)

## 2021-05-25 MED ORDER — POTASSIUM CHLORIDE CRYS ER 20 MEQ PO TBCR
40.0000 meq | EXTENDED_RELEASE_TABLET | Freq: Once | ORAL | Status: AC
  Administered 2021-05-25: 40 meq via ORAL
  Filled 2021-05-25: qty 2

## 2021-05-25 MED ORDER — METRONIDAZOLE 500 MG PO TABS
500.0000 mg | ORAL_TABLET | Freq: Two times a day (BID) | ORAL | Status: DC
  Administered 2021-05-25 – 2021-05-26 (×3): 500 mg via ORAL
  Filled 2021-05-25 (×3): qty 1

## 2021-05-25 NOTE — Progress Notes (Signed)
[facility]                                                                              Patient Demographics  [name], is a 25 y.o. male, DOB - Oct 20, 1996, MAU:[id number]  Admit date - 05/20/2021   Admitting Physician Barton Dubois, MD  Outpatient Primary MD for the patient is Zhou-Talbert, Elwyn Lade, MD  Outpatient specialists:   LOS - 5  days   Medical records reviewed and are as summarized below:    Chief Complaint  Patient presents with  . Foot Ulcer       Brief summary   Patient is a 25 year old male with history of hypertension, diabetes, hyperlipidemia, mild LE arterial insufficiency, neuropathy, chronic right foot wound presented to ED secondary to increased drainage, swelling and redness on the lateral aspect of his right foot.  Patient reported that symptoms have been present for last 2 to 3 days prior to admission and were progressing. Right foot x-ray showed osteomyelitis/septic joint involving the fifth metatarsal head and base of the fifth proximal phalanx MRI right foot showed large soft tissue wound overlying the fifth metatarsal head.  Osteomyelitis of the fifth metatarsal head and base of the fifth proximal phalanx.  Complex fluid collection around the fifth MTP joint measuring 2.9x 2.5x 1.6 cm extending from the plantar aspect of the dorsal aspect consistent with an abscess. 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 -Wound cultures 5/29 showed MRSA, ID consulted -Currently on IV vancomycin, recommending 6 weeks of antibiotic coverage with IV vancomycin and add oral metronidazole for 2 weeks.  -Will await podiatry recommendations regarding disposition from  wound standpoint  Active Problems:   HTN (hypertension) -BP stable, continue Coreg, Imdur    Uncontrolled type 1 diabetes mellitus with hyperglycemia ([place]), IDDM with osteomyelitis -CBGs now improving -Continue Lantus 20 mg twice daily, moderate sliding scale insulin, meal coverage 4 units 3 times daily  Recent Labs    05/24/21 1115 05/24/21 1447 05/24/21 1643 05/24/21 2021 05/25/21 0651 05/25/21 1136  GLUCAP 201* 174* 150* 93 154* 171*       HLD (hyperlipidemia) -Continue statin    Malnutrition of moderate degree secondary to chronic illness/diabetes Estimated body mass index is 26.87 kg/m as calculated from the following:   Height as of this encounter: 6\' 3"  (1.905 m).   Weight as of this encounter: 97.5 kg. -Dietitian consult, nutritional supplements  Lower extremity arterial insufficiency -Continue aspirin, Lipitor  Code Status: Full CODE STATUS DVT Prophylaxis:  enoxaparin (LOVENOX) injection 40 mg Start: 05/20/21 2200   Level of Care: Level of care: Med-Surg Family Communication: Discussed all imaging results, lab results, explained to the patient    Disposition Plan:     Status is: Inpatient  Remains inpatient appropriate because:Inpatient level of care appropriate due to severity of illness   Dispo: The patient is  from: Home              Anticipated d/c is to: Home              Patient currently is not medically stable to d/c.  Will need 6 weeks of IV vancomycin and 2 weeks oral metronidazole, checking home health if possible will need PICC line.  Awaiting podiatry recommendations for disposition from wound standpoint    Difficult to place patient No      Time Spent in minutes   25 minutes  Procedures:  5/29 ray amputation fifth right toe 6/1 secondary closure of the wound   Consultants:   Dr March Rummage  Infectious disease  Antimicrobials:   Anti-infectives (From admission, onward)   Start     Dose/Rate Route Frequency Ordered Stop   05/24/21  2200  vancomycin (VANCOREADY) IVPB 1000 mg/200 mL        1,000 mg 200 mL/hr over 60 Minutes Intravenous Every 8 hours 05/24/21 1511     05/24/21 1613  vancomycin (VANCOCIN) powder  Status:  Discontinued          As needed 05/24/21 1614 05/24/21 1635   05/21/21 1400  vancomycin (VANCOREADY) IVPB 1500 mg/300 mL  Status:  Discontinued        1,500 mg 150 mL/hr over 120 Minutes Intravenous Every 8 hours 05/21/21 1030 05/24/21 1511   05/21/21 1300  cefTRIAXone (ROCEPHIN) 2 g in sodium chloride 0.9 % 100 mL IVPB  Status:  Discontinued        2 g 200 mL/hr over 30 Minutes Intravenous Every 24 hours 05/20/21 1308 05/25/21 1141   05/20/21 2200  vancomycin (VANCOCIN) IVPB 1000 mg/200 mL premix  Status:  Discontinued        1,000 mg 200 mL/hr over 60 Minutes Intravenous Every 8 hours 05/20/21 1303 05/21/21 1030   05/20/21 1245  vancomycin (VANCOREADY) IVPB 2000 mg/400 mL        2,000 mg 200 mL/hr over 120 Minutes Intravenous  Once 05/20/21 1233 05/20/21 1534   05/20/21 1245  cefTRIAXone (ROCEPHIN) 2 g in sodium chloride 0.9 % 100 mL IVPB        2 g 200 mL/hr over 30 Minutes Intravenous  Once 05/20/21 1243 05/20/21 1327   05/20/21 1215  vancomycin (VANCOCIN) IVPB 1000 mg/200 mL premix  Status:  Discontinued        1,000 mg 200 mL/hr over 60 Minutes Intravenous  Once 05/20/21 1214 05/20/21 1233   05/20/21 1215  aztreonam (AZACTAM) 2 g in sodium chloride 0.9 % 100 mL IVPB  Status:  Discontinued        2 g 200 mL/hr over 30 Minutes Intravenous  Once 05/20/21 1214 05/20/21 1249         Medications  Scheduled Meds: . aspirin EC  81 mg Oral Daily  . atorvastatin  10 mg Oral q1800  . carvedilol  25 mg Oral BID  . enoxaparin (LOVENOX) injection  40 mg Subcutaneous Q24H  . feeding supplement (GLUCERNA SHAKE)  237 mL Oral TID BM  . insulin aspart  0-15 Units Subcutaneous TID WC  . insulin aspart  0-5 Units Subcutaneous QHS  . insulin aspart  4 Units Subcutaneous TID WC  . insulin glargine  20  Units Subcutaneous BID  . isosorbide mononitrate  30 mg Oral Daily  . living well with diabetes book   Does not apply Once  . nutrition supplement (JUVEN)  1 packet Oral BID BM  . pantoprazole  40 mg Oral Daily   Continuous Infusions: . vancomycin 1,000 mg (05/25/21 0518)   PRN Meds:.acetaminophen **OR** acetaminophen, HYDROcodone-acetaminophen, hydrOXYzine, ondansetron **OR** ondansetron (ZOFRAN) IV, phenol, zolpidem      Subjective:   Corrie Brannen was seen and examined today.  No acute complaints.  Pain in the right foot controlled.  Overnight no acute issues.  No fevers. Patient denies dizziness, chest pain, shortness of breath, abdominal pain, N/V/D/C.    Objective:   Vitals:   05/24/21 1804 05/24/21 2020 05/25/21 0518 05/25/21 0900  BP: 140/90 127/79 128/78 134/81  Pulse: 90 93 89 84  Resp: 16 16 16 17   Temp: (!) 97.5 F (36.4 C) 97.8 F (36.6 C) 98.1 F (36.7 C) 98.2 F (36.8 C)  TempSrc:  Oral Oral Oral  SpO2: 99% 100% 100% 100%  Weight:      Height:        Intake/Output Summary (Last 24 hours) at 05/25/2021 1235 Last data filed at 05/25/2021 1138 Gross per 24 hour  Intake 1050 ml  Output 1705 ml  Net -655 ml     Wt Readings from Last 3 Encounters:  05/21/21 97.5 kg  03/27/21 98.8 kg  02/06/21 97.5 kg   Physical Exam  General: Alert and oriented x 3, NAD  Cardiovascular: S1 S2 clear, RRR. No pedal edema b/l  Respiratory: CTAB, no wheezing, rales or rhonchi  Gastrointestinal: Soft, nontender, nondistended, NBS  Ext: no pedal edema bilaterally  Neuro: no new deficits  Musculoskeletal: No cyanosis, clubbing  Skin: Right foot dressing intact  Psych: Normal affect and demeanor, alert and oriented x3    Data Reviewed:  I have personally reviewed following labs and imaging studies  Micro Results Recent Results (from the past 240 hour(s))  Resp Panel by RT-PCR (Flu A&B, Covid) Nasopharyngeal Swab     Status: None   Collection Time: 05/20/21  12:46 PM   Specimen: Nasopharyngeal Swab; Nasopharyngeal(NP) swabs in vial transport medium  Result Value Ref Range Status   SARS Coronavirus 2 by RT PCR NEGATIVE NEGATIVE Final    Comment: (NOTE) SARS-CoV-2 target nucleic acids are NOT DETECTED.  The SARS-CoV-2 RNA is generally detectable in upper respiratory specimens during the acute phase of infection. The lowest concentration of SARS-CoV-2 viral copies this assay can detect is 138 copies/mL. A negative result does not preclude SARS-Cov-2 infection and should not be used as the sole basis for treatment or other patient management decisions. A negative result may occur with  improper specimen collection/handling, submission of specimen other than nasopharyngeal swab, presence of viral mutation(s) within the areas targeted by this assay, and inadequate number of viral copies(<138 copies/mL). A negative result must be combined with clinical observations, patient history, and epidemiological information. The expected result is Negative.  Fact Sheet for Patients:  EntrepreneurPulse.com.au  Fact Sheet for Healthcare Providers:  IncredibleEmployment.be  This test is no t yet approved or cleared by the [place] FDA and  has been authorized for detection and/or diagnosis of SARS-CoV-2 by FDA under an Emergency Use Authorization (EUA). This EUA will remain  in effect (meaning this test can be used) for the duration of the COVID-19 declaration under Section 564(b)(1) of the Act, 21 U.S.C.section 360bbb-3(b)(1), unless the authorization is terminated  or revoked sooner.       Influenza A by PCR NEGATIVE NEGATIVE Final   Influenza B by PCR NEGATIVE NEGATIVE Final    Comment: (NOTE) The Xpert Xpress SARS-CoV-2/FLU/RSV plus assay is intended as an aid in the  diagnosis of influenza from Nasopharyngeal swab specimens and should not be used as a sole basis for treatment. Nasal washings and aspirates  are unacceptable for Xpert Xpress SARS-CoV-2/FLU/RSV testing.  Fact Sheet for Patients: EntrepreneurPulse.com.au  Fact Sheet for Healthcare Providers: IncredibleEmployment.be  This test is not yet approved or cleared by the [place] FDA and has been authorized for detection and/or diagnosis of SARS-CoV-2 by FDA under an Emergency Use Authorization (EUA). This EUA will remain in effect (meaning this test can be used) for the duration of the COVID-19 declaration under Section 564(b)(1) of the Act, 21 U.S.C. section 360bbb-3(b)(1), unless the authorization is terminated or revoked.  Performed at [facility], [address] [place], [place] [address]   Culture, blood (routine x 2)     Status: None   Collection Time: 05/20/21  1:24 PM   Specimen: BLOOD LEFT ARM  Result Value Ref Range Status   Specimen Description BLOOD LEFT ARM BOTTLES DRAWN AEROBIC AND ANAEROBIC  Final   Special Requests Blood Culture adequate volume  Final   Culture   Final    NO GROWTH 5 DAYS Performed at [facility], [address] [place], [place] [address]    Report Status 05/25/2021 FINAL  Final  Culture, blood (routine x 2)     Status: None   Collection Time: 05/20/21  1:24 PM   Specimen: BLOOD LEFT HAND  Result Value Ref Range Status   Specimen Description   Final    BLOOD LEFT HAND BOTTLES DRAWN AEROBIC AND ANAEROBIC   Special Requests Blood Culture adequate volume  Final   Culture   Final    NO GROWTH 5 DAYS Performed at [facility], [address] [place], [place] [address]    Report Status 05/25/2021 FINAL  Final  Surgical pcr screen     Status: None   Collection Time: 05/20/21  8:29 PM   Specimen: Nasal Mucosa; Nasal Swab  Result Value Ref Range Status   MRSA, PCR NEGATIVE NEGATIVE Final   Staphylococcus aureus NEGATIVE NEGATIVE Final    Comment: (NOTE) The Xpert SA Assay (FDA approved for NASAL specimens in patients 29 years of age and older), is  one component of a comprehensive surveillance program. It is not intended to diagnose infection nor to guide or monitor treatment. Performed at [facility], [place] [address], [place],  [place] [address]   Aerobic/Anaerobic Culture w Gram Stain (surgical/deep wound)     Status: None (Preliminary result)   Collection Time: 05/21/21  3:10 PM   Specimen: PATH Other; Tissue  Result Value Ref Range Status   Specimen Description WOUND  Final   Special Requests WOUND CULTURE OF 5TH METATARSAL RIGHT SPEC A  Final   Gram Stain   Final    NO WBC SEEN NO ORGANISMS SEEN Performed at [facility], [place] [address] [place], [place] [address]    Culture   Final    FEW METHICILLIN RESISTANT STAPHYLOCOCCUS AUREUS NO ANAEROBES ISOLATED; CULTURE IN PROGRESS FOR 5 DAYS    Report Status PENDING  Incomplete   Organism ID, Bacteria METHICILLIN RESISTANT STAPHYLOCOCCUS AUREUS  Final      Susceptibility   Methicillin resistant staphylococcus aureus - MIC*    CIPROFLOXACIN >=8 RESISTANT Resistant     ERYTHROMYCIN >=8 RESISTANT Resistant     GENTAMICIN <=0.5 SENSITIVE Sensitive     OXACILLIN >=4 RESISTANT Resistant     TETRACYCLINE <=1 SENSITIVE Sensitive     VANCOMYCIN 1 SENSITIVE Sensitive  TRIMETH/SULFA <=10 SENSITIVE Sensitive     CLINDAMYCIN <=0.25 SENSITIVE Sensitive     RIFAMPIN <=0.5 SENSITIVE Sensitive     Inducible Clindamycin NEGATIVE Sensitive     * FEW METHICILLIN RESISTANT STAPHYLOCOCCUS AUREUS  Aerobic/Anaerobic Culture w Gram Stain (surgical/deep wound)     Status: None (Preliminary result)   Collection Time: 05/21/21  3:12 PM   Specimen: PATH Other; Tissue  Result Value Ref Range Status   Specimen Description BONE  Final   Special Requests BONE CULTURE OF 4TH METATARSAL RIGHT SPEC B  Final   Gram Stain   Final    FEW WBC PRESENT, PREDOMINANTLY MONONUCLEAR NO ORGANISMS SEEN    Culture   Final    NO GROWTH 4 DAYS NO ANAEROBES ISOLATED; CULTURE IN PROGRESS FOR 5  DAYS Performed at [facility], [address], [place], [place] [address]    Report Status PENDING  Incomplete    Radiology Reports MR FOOT RIGHT W WO CONTRAST  Result Date: 05/21/2021 CLINICAL DATA:  Worsening infection and drainage of the right foot. EXAM: MRI OF THE RIGHT FOREFOOT WITHOUT AND WITH CONTRAST TECHNIQUE: Multiplanar, multisequence MR imaging of the right forefoot was performed before and after the administration of intravenous contrast. CONTRAST:  71mL GADAVIST GADOBUTROL 1 MMOL/ML IV SOLN COMPARISON:  None. FINDINGS: Bones/Joint/Cartilage Large soft tissue wound overlying the fifth metatarsal head. Cortical destruction of the fifth metatarsal head with extensive bone marrow edema throughout the first metatarsal head and extending into the shaft most consistent with osteomyelitis. Bone destruction at the base of the fifth proximal phalanx with associated bone marrow edema consistent with osteomyelitis. Complex fluid collection centered around the fifth MTP joint measuring 2.9 x 2.5 x 1.6 cm extending from the plantar aspect to the dorsal aspect consistent with an abscess. Bone marrow edema in the fifth distal phalanx which may be reactive versus secondary to osteomyelitis. Marrow edema in the head of fourth proximal phalanx without cortical destruction may reflect reactive edema versus early osteomyelitis. No acute fracture or dislocation. Normal alignment. No joint effusion. Ligaments Collateral ligaments are intact.  Lisfranc ligament is intact. Muscles and Tendons Flexor, peroneal and extensor compartment tendons are intact. Diffuse T2 hyperintense signal throughout the plantar musculature likely neurogenic. An element of myositis is not completely excluded. Soft tissue No other fluid collection or hematoma. No soft tissue mass. Severe soft tissue edema along the dorsal aspect of the foot concerning for cellulitis. IMPRESSION: 1. Large soft tissue wound overlying the fifth  metatarsal head. Osteomyelitis of the fifth metatarsal head and base of the fifth proximal phalanx. Complex fluid collection centered around the fifth MTP joint measuring 2.9 x 2.5 x 1.6 cm extending from the plantar aspect to the dorsal aspect consistent with an abscess. Bone marrow edema in the fifth distal phalanx which may be reactive versus secondary to osteomyelitis. 2. Marrow edema in the head of fourth proximal phalanx without cortical destruction may reflect reactive edema versus early osteomyelitis. 3. Severe soft tissue edema along the dorsal aspect of the foot concerning for cellulitis. Electronically Signed   By: Kathreen Devoid   On: 05/21/2021 10:58   DG Foot 2 Views Right  Result Date: 05/21/2021 CLINICAL DATA:  Status post surgery for osteomyelitis. EXAM: RIGHT FOOT - 2 VIEW COMPARISON:  05/20/2021. FINDINGS: The fifth toe and the fifth metatarsal from the midshaft distally have been resected. The resected end of the remaining fifth metatarsal is well-defined. Small focus of lucency with resorption of the overlying cortex,  along the distal, lateral aspect of the proximal phalanx of the fourth toe. This is not evident on the previous day's study, which would argue against osteomyelitis. Still, a small focus of osteomyelitis is possible. There is evidence of a subtle overlying soft tissue defect. There are no additional areas of bone lucency or resorption to suggest osteomyelitis. Joints are normally spaced and aligned. No soft tissue air. IMPRESSION: 1. Status post resection of the fifth toe and the middle through distal aspect of the fifth metatarsal. 2. Small focus of lucency in the distal lateral aspect of the proximal phalanx of the fourth toe. This could potentially reflect a small focus osteomyelitis. It may be related to the recent surgery. Recommend follow-up imaging in 4-6 weeks to reassess. Electronically Signed   By: Lajean Manes M.D.   On: 05/21/2021 16:42   DG Foot Complete  Right  Result Date: 05/20/2021 CLINICAL DATA:  Wound to distal 5th metatarsal EXAM: RIGHT FOOT COMPLETE - 3+ VIEW COMPARISON:  12/21/2020 FINDINGS: Soft tissue wound/ulceration adjacent to the distal 5th metatarsal head. Adjacent cortical irregularity/destruction involving the 5th metatarsal head and likely the lateral base of the 5th proximal phalanx, suggesting osteomyelitis/septic joint. Mild lateral soft tissue swelling involving the 5th digit. IMPRESSION: Osteomyelitis/septic joint involving the 5th metatarsal head and base of the 5th proximal phalanx. Electronically Signed   By: Julian Hy M.D.   On: 05/20/2021 10:40   [place] EKG SITE RITE  Result Date: 05/25/2021 If Site Rite image not attached, placement could not be confirmed due to current cardiac rhythm.   Lab Data:  CBC: Recent Labs  Lab 05/20/21 1011 05/24/21 0933  WBC 18.5* 12.3*  NEUTROABS 14.7*  --   HGB 14.0 13.4  HCT 42.0 39.7  MCV 83.7 82.9  PLT 303 299*   Basic Metabolic Panel: Recent Labs  Lab 05/20/21 1011 05/22/21 0050 05/23/21 0144 05/24/21 0156 05/24/21 0933 05/25/21 0330  NA 130*  --   --   --  134* 135  K 4.1  --   --   --  3.9 3.4*  CL 95*  --   --   --  99 98  CO2 25  --   --   --  23 26  GLUCOSE 396*  --   --   --  209* 147*  BUN 11  --   --   --  11 11  CREATININE 0.72 0.66 0.70 0.60* 0.65 0.60*  CALCIUM 9.7  --   --   --  9.7 9.4   GFR: Estimated Creatinine Clearance: 171.6 mL/min (A) (by C-G formula based on SCr of 0.6 mg/dL (L)). Liver Function Tests: No results for input(s): AST, ALT, ALKPHOS, BILITOT, PROT, ALBUMIN in the last 168 hours. No results for input(s): LIPASE, AMYLASE in the last 168 hours. No results for input(s): AMMONIA in the last 168 hours. Coagulation Profile: No results for input(s): INR, PROTIME in the last 168 hours. Cardiac Enzymes: No results for input(s): CKTOTAL, CKMB, CKMBINDEX, TROPONINI in the last 168 hours. BNP (last 3 results) No results for  input(s): PROBNP in the last 8760 hours. HbA1C: No results for input(s): HGBA1C in the last 72 hours. CBG: Recent Labs  Lab 05/24/21 1447 05/24/21 1643 05/24/21 2021 05/25/21 0651 05/25/21 1136  GLUCAP 174* 150* 93 154* 171*   Lipid Profile: No results for input(s): CHOL, HDL, LDLCALC, TRIG, CHOLHDL, LDLDIRECT in the last 72 hours. Thyroid Function Tests: No results for input(s): TSH, T4TOTAL, FREET4, T3FREE, THYROIDAB  in the last 72 hours. Anemia Panel: No results for input(s): VITAMINB12, FOLATE, FERRITIN, TIBC, IRON, RETICCTPCT in the last 72 hours. Urine analysis:    Component Value Date/Time   COLORURINE STRAW (A) 08/10/2018 1425   APPEARANCEUR CLEAR 08/10/2018 1425   LABSPEC 1.035 (H) 08/10/2018 1425   PHURINE 7.0 08/10/2018 1425   GLUCOSEU >=500 (A) 08/10/2018 1425   HGBUR NEGATIVE 08/10/2018 1425   BILIRUBINUR NEGATIVE 08/10/2018 1425   KETONESUR 5 (A) 08/10/2018 1425   PROTEINUR NEGATIVE 08/10/2018 1425   NITRITE NEGATIVE 08/10/2018 1425   LEUKOCYTESUR NEGATIVE 08/10/2018 1425     Binyamin Nelis M.D. [facility] 05/25/2021, 12:35 PM  Available via Epic secure chat 7am-7pm After 7 pm, please refer to night coverage provider listed on amion.

## 2021-05-25 NOTE — Consult Note (Addendum)
I have seen and examined the patient. I have personally reviewed the clinical findings, laboratory findings, microbiological data and imaging studies. The assessment and treatment plan was discussed with the  Advance Practice Provider, Mauricio Po  I agree with her/his recommendations except following additions/corrections.  25 yo male with DM a neuropathy, HTN, mild arterial insufficiency with chronic rt foot wound who presented with increased drainage from the rt foot for 2-3 days. Denies fevers, chills and sweats and taking PO antibiotics prior to arrival. Normal ABI in 01/23/21. 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 OR cultures with MRSA.  S/p RT foot secondary wound closure on 6/1  Discussed with patient regarding DM control and chances of recurrence of infections even with IV an  All imaging/Labs/Microibiological data/OR notes/Pictures of Rt foot reviewed  Exam Poor dental hygiene with missing teeth and dental caries  Heart and lung sounds Systolic murmur+ Abdomen - soft and non tender Rt Lower extremity is wrapped in a bandage. Left PT pulse 2+   Continue Vancomycin, pharmacy to dose ( OK to switch to Daptomycin if affordable) - Plan to treat for 6 weeks  Add metronidazole 511m PO BID for 2 weeks - plan to treat for 2 weeks  Monitor CBC, BMP, Vancomycin trough/CPK ESR and CRP weekly  A follow up with [facility] has been made  Will follow cultures remotely to make sure nothing else grows. Otherwise will sign off for now.    SRosiland Oz M[facility] Infectious Disease [facility] Infect[facility] Disease    Date of Admission:  05/20/2021     Total days of antibiotics 6               Reason for Consult: Osteomyelitis     Referring Provider: Rai Primary Care Provider: ZAlfonse Flavors MD   ASSESSMENT:  [name] a 25y/o male with poorly controlled diabetes (last a1c of 108.1on 54/48  complicated by neuropathy presented with worsening chronic foot wound found to have acute osteomyelitis now s/p 5th ray amputation with culture positive for MRSA. There was concern for involvement of the 4th toe which was biopsied and culture has been without growth. Given this concern for osteomyelitis we discussed the recommendation of 6 weeks of antibiotic coverage with vancomycin and will add oral metronidazole for 2 weeks. Will check availability of Home Health given he is uninsured with potential need for oral antibiotics. Discussed importance of blood sugar control and increased protein intake to reduce risk of complicated healing and reduce risk of further infection. Continue with wound care per Podiatry recommendation. Arrange follow up in ID clinic.   PLAN:  1. Continue current vancomycin. 2. Monitor renal function and vancomycin levels per protocol for therapeutic drug monitoring.  3. Checking availability of Home Health.    4. Optimize protein and control blood sugars. 5. Will remotely follow for changes to plan of care.    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 aspirin EC  81 mg Oral Daily  . atorvastatin  10 mg Oral q1800  . carvedilol  25 mg Oral BID  . enoxaparin (LOVENOX) injection  40 mg Subcutaneous  Q24H  . feeding supplement (GLUCERNA SHAKE)  237 mL Oral TID BM  . insulin aspart  0-15 Units Subcutaneous TID WC  . insulin aspart  0-5 Units Subcutaneous QHS  . insulin aspart  4 Units Subcutaneous TID WC  . insulin glargine  20 Units Subcutaneous BID  . isosorbide mononitrate  30 mg Oral Daily  . living well with diabetes book   Does not apply Once  . nutrition supplement (JUVEN)  1 packet Oral  BID BM  . pantoprazole  40 mg Oral Daily     HPI: [name] is a 25 y.o. male with previous medical history of hypertension, arterial insufficiency and Type 2 diabetes complicated by neuropathy admitted with chronic right foot wound with increased drainage, swelling and redness.  [name] has been followed by the [place] [facility] for a wound that started as a blister and has gradually progressed. On admission had leukocytosis with WBC count of 18.5 and elevated inflammatory markers with CRP 30.7 and ESR 72.  Right foot x-ray with osteomyelitis/septic joint involving the 5th metatarsal head and base of the 5th proximal phalanx. MRI right foot with large soft tissue wound overlying the fifth metatarsal head and osteomyelitis of the fifth metatarsal head and base of the fifth proximal phalanx and complex fluid collection centered around the fifth MTP joint measuring 2.9 x 2.5 x 1.6 cm extending to the dorsal aspect. On 5/29 underwent 5th ray amputation and biopsy of the 4th proximal phalanx sent for culture. Returned to the OR on 6/1 for wound closure.  Mr. Battershell has been afebrile since admission. Currently on Day 6 of antimicrobial treatment - Day 5 of vancomycin (5/28-), Day 6 of Ceftraixone (5/28-) and 1 dose of aztreonam. Surgical specimen sent to pathology is acute osteomyelitis. Biopsy sent for culture without growth to date. Wound culture of 5th metatarsal with MRSA. ID has been asked for antibiotic recommendations.    Review of Systems: Review of Systems  Constitutional: Negative for chills, fever and weight loss.  Respiratory: Negative for cough, shortness of breath and wheezing.   Cardiovascular: Negative for chest pain and leg swelling.  Gastrointestinal: Negative for abdominal pain, constipation, diarrhea, nausea and vomiting.  Skin: Negative for rash.     Past Medical History:  Diagnosis Date  . Complication of anesthesia    Hard to wake up when he was  younger once.  . Diabetes mellitus, type II ([place])   . Heart murmur   . Hypertension     Social History   Tobacco Use  . Smoking status: Passive Smoke Exposure - Never Smoker  . Smokeless tobacco: Never Used  Vaping Use  . Vaping Use: Never used  Substance Use Topics  . Alcohol use: No  . Drug use: No    Family History  Problem Relation Age of Onset  . Diabetes Maternal Grandmother   . Thyroid disease Maternal Grandmother   . Hypertension Maternal Grandmother   . Healthy Mother   . Healthy Father   . Healthy Sister   . Healthy Sister     Allergies  Allergen Reactions  . Amoxicillin Hives    Has patient had a PCN reaction causing immediate rash, facial/tongue/throat swelling, SOB or lightheadedness with hypotension: No Has patient had a PCN reaction causing severe rash involving mucus membranes or skin necrosis: No Has patient had a PCN reaction that required hospitalization: No Has patient had a PCN reaction occurring within the last 10 years: No If  all of the above answers are "NO", then may proceed with Cephalosporin use.     OBJECTIVE: Blood pressure 134/81, pulse 84, temperature 98.2 F (36.8 C), temperature source Oral, resp. rate 17, height $RemoveBe'6\' 3"'rtkqHDauc$  (1.905 m), weight 97.5 kg, SpO2 100 %.  Physical Exam Constitutional:      General: He is not in acute distress.    Appearance: He is well-developed.     Comments: Lying in bed with head of bed elevated; pleasant.   Cardiovascular:     Rate and Rhythm: Normal rate and regular rhythm.     Heart sounds: Normal heart sounds.  Pulmonary:     Effort: Pulmonary effort is normal.     Breath sounds: Normal breath sounds.  Musculoskeletal:     Comments: Surgical dressing and cam walker on right foot.   Skin:    General: Skin is warm and dry.  Neurological:     Mental Status: He is alert and oriented to person, place, and time.           Lab Results Lab Results  Component Value Date   WBC 12.3 (H)  05/24/2021   HGB 13.4 05/24/2021   HCT 39.7 05/24/2021   MCV 82.9 05/24/2021   PLT 406 (H) 05/24/2021    Lab Results  Component Value Date   CREATININE 0.60 (L) 05/25/2021   BUN 11 05/25/2021   NA 135 05/25/2021   K 3.4 (L) 05/25/2021   CL 98 05/25/2021   CO2 26 05/25/2021    Lab Results  Component Value Date   ALT 30 03/17/2019   AST 23 03/17/2019   ALKPHOS 47 11/19/2018   BILITOT 0.3 03/17/2019     Microbiology: Recent Results (from the past 240 hour(s))  Resp Panel by RT-PCR (Flu A&B, Covid) Nasopharyngeal Swab     Status: None   Collection Time: 05/20/21 12:46 PM   Specimen: Nasopharyngeal Swab; Nasopharyngeal(NP) swabs in vial transport medium  Result Value Ref Range Status   SARS Coronavirus 2 by RT PCR NEGATIVE NEGATIVE Final    Comment: (NOTE) SARS-CoV-2 target nucleic acids are NOT DETECTED.  The SARS-CoV-2 RNA is generally detectable in upper respiratory specimens during the acute phase of infection. The lowest concentration of SARS-CoV-2 viral copies this assay can detect is 138 copies/mL. A negative result does not preclude SARS-Cov-2 infection and should not be used as the sole basis for treatment or other patient management decisions. A negative result may occur with  improper specimen collection/handling, submission of specimen other than nasopharyngeal swab, presence of viral mutation(s) within the areas targeted by this assay, and inadequate number of viral copies(<138 copies/mL). A negative result must be combined with clinical observations, patient history, and epidemiological information. The expected result is Negative.  Fact Sheet for Patients:  EntrepreneurPulse.com.au  Fact Sheet for Healthcare Providers:  IncredibleEmployment.be  This test is no t yet approved or cleared by the [place] FDA and  has been authorized for detection and/or diagnosis of SARS-CoV-2 by FDA under an Emergency Use  Authorization (EUA). This EUA will remain  in effect (meaning this test can be used) for the duration of the COVID-19 declaration under Section 564(b)(1) of the Act, 21 U.S.C.section 360bbb-3(b)(1), unless the authorization is terminated  or revoked sooner.       Influenza A by PCR NEGATIVE NEGATIVE Final   Influenza B by PCR NEGATIVE NEGATIVE Final    Comment: (NOTE) The Xpert Xpress SARS-CoV-2/FLU/RSV plus assay is intended as an aid in the diagnosis  of influenza from Nasopharyngeal swab specimens and should not be used as a sole basis for treatment. Nasal washings and aspirates are unacceptable for Xpert Xpress SARS-CoV-2/FLU/RSV testing.  Fact Sheet for Patients: EntrepreneurPulse.com.au  Fact Sheet for Healthcare Providers: IncredibleEmployment.be  This test is not yet approved or cleared by the [place] FDA and has been authorized for detection and/or diagnosis of SARS-CoV-2 by FDA under an Emergency Use Authorization (EUA). This EUA will remain in effect (meaning this test can be used) for the duration of the COVID-19 declaration under Section 564(b)(1) of the Act, 21 U.S.C. section 360bbb-3(b)(1), unless the authorization is terminated or revoked.  Performed at [facility], [address], [place], [place] [address]   Culture, blood (routine x 2)     Status: None   Collection Time: 05/20/21  1:24 PM   Specimen: BLOOD LEFT ARM  Result Value Ref Range Status   Specimen Description BLOOD LEFT ARM BOTTLES DRAWN AEROBIC AND ANAEROBIC  Final   Special Requests Blood Culture adequate volume  Final   Culture   Final    NO GROWTH 5 DAYS Performed at [facility], [address] [place], [place] [address]    Report Status 05/25/2021 FINAL  Final  Culture, blood (routine x 2)     Status: None   Collection Time: 05/20/21  1:24 PM   Specimen: BLOOD LEFT HAND  Result Value Ref Range Status   Specimen Description   Final    BLOOD LEFT  HAND BOTTLES DRAWN AEROBIC AND ANAEROBIC   Special Requests Blood Culture adequate volume  Final   Culture   Final    NO GROWTH 5 DAYS Performed at [facility], [address] [place], [place] [address]    Report Status 05/25/2021 FINAL  Final  Surgical pcr screen     Status: None   Collection Time: 05/20/21  8:29 PM   Specimen: Nasal Mucosa; Nasal Swab  Result Value Ref Range Status   MRSA, PCR NEGATIVE NEGATIVE Final   Staphylococcus aureus NEGATIVE NEGATIVE Final    Comment: (NOTE) The Xpert SA Assay (FDA approved for NASAL specimens in patients 96 years of age and older), is one component of a comprehensive surveillance program. It is not intended to diagnose infection nor to guide or monitor treatment. Performed at [facility], [place] [address], [place], [place] [address]   Aerobic/Anaerobic Culture w Gram Stain (surgical/deep wound)     Status: None (Preliminary result)   Collection Time: 05/21/21  3:10 PM   Specimen: PATH Other; Tissue  Result Value Ref Range Status   Specimen Description WOUND  Final   Special Requests WOUND CULTURE OF 5TH METATARSAL RIGHT SPEC A  Final   Gram Stain   Final    NO WBC SEEN NO ORGANISMS SEEN Performed at [facility], [place] [address] [place], [place] [address]    Culture   Final    FEW METHICILLIN RESISTANT STAPHYLOCOCCUS AUREUS NO ANAEROBES ISOLATED; CULTURE IN PROGRESS FOR 5 DAYS    Report Status PENDING  Incomplete   Organism ID, Bacteria METHICILLIN RESISTANT STAPHYLOCOCCUS AUREUS  Final      Susceptibility   Methicillin resistant staphylococcus aureus - MIC*    CIPROFLOXACIN >=8 RESISTANT Resistant     ERYTHROMYCIN >=8 RESISTANT Resistant     GENTAMICIN <=0.5 SENSITIVE Sensitive     OXACILLIN >=4 RESISTANT Resistant     TETRACYCLINE <=1 SENSITIVE Sensitive     VANCOMYCIN 1 SENSITIVE Sensitive  TRIMETH/SULFA <=10 SENSITIVE Sensitive     CLINDAMYCIN <=0.25 SENSITIVE Sensitive     RIFAMPIN <=0.5 SENSITIVE  Sensitive     Inducible Clindamycin NEGATIVE Sensitive     * FEW METHICILLIN RESISTANT STAPHYLOCOCCUS AUREUS  Aerobic/Anaerobic Culture w Gram Stain (surgical/deep wound)     Status: None (Preliminary result)   Collection Time: 05/21/21  3:12 PM   Specimen: PATH Other; Tissue  Result Value Ref Range Status   Specimen Description BONE  Final   Special Requests BONE CULTURE OF 4TH METATARSAL RIGHT SPEC B  Final   Gram Stain   Final    FEW WBC PRESENT, PREDOMINANTLY MONONUCLEAR NO ORGANISMS SEEN    Culture   Final    NO GROWTH 4 DAYS NO ANAEROBES ISOLATED; CULTURE IN PROGRESS FOR 5 DAYS Performed at [facility], [address], [place], [place] [address]    Report Status PENDING  Incomplete   RT Foot Xray 05/21/21 IMPRESSION: 1. Status post resection of the fifth toe and the middle through distal aspect of the fifth metatarsal. 2. Small focus of lucency in the distal lateral aspect of the proximal phalanx of the fourth toe. This could potentially reflect a small focus osteomyelitis. It may be related to the recent surgery. Recommend follow-up imaging in 4-6 weeks to reassess.  MRI RT foot 05/21/21 IMPRESSION: 1. Large soft tissue wound overlying the fifth metatarsal head. Osteomyelitis of the fifth metatarsal head and base of the fifth proximal phalanx. Complex fluid collection centered around the fifth MTP joint measuring 2.9 x 2.5 x 1.6 cm extending from the plantar aspect to the dorsal aspect consistent with an abscess. Bone marrow edema in the fifth distal phalanx which may be reactive versus secondary to osteomyelitis. 2. Marrow edema in the head of fourth proximal phalanx without cortical destruction may reflect reactive edema versus early osteomyelitis. 3. Severe soft tissue edema along the dorsal aspect of the foot concerning for cellulitis.    Terri Piedra, NP [place] [facility]  05/25/2021  10:46 AM

## 2021-05-25 NOTE — Anesthesia Postprocedure Evaluation (Signed)
Anesthesia Post Note  Patient: ILEY BREEDEN  Procedure(s) Performed: RIGHT FOOT WOUND DEBRIDEMENT AND CLOSURE (Right Foot)     Patient location during evaluation: PACU Anesthesia Type: General Level of consciousness: awake and alert Pain management: pain level controlled Vital Signs Assessment: post-procedure vital signs reviewed and stable Respiratory status: spontaneous breathing, nonlabored ventilation, respiratory function stable and patient connected to nasal cannula oxygen Cardiovascular status: blood pressure returned to baseline and stable Postop Assessment: no apparent nausea or vomiting Anesthetic complications: no   No complications documented.  Last Vitals:  Vitals:   05/25/21 0518 05/25/21 0900  BP: 128/78 134/81  Pulse: 89 84  Resp: 16 17  Temp: 36.7 C 36.8 C  SpO2: 100% 100%    Last Pain:  Vitals:   05/25/21 0900  TempSrc: Oral  PainSc:                  Catalina Gravel

## 2021-05-25 NOTE — Progress Notes (Signed)
Bedside shift report complete. Received patient awake,alert/orientedx4 and able to verbalize needs. NAD noted; respirations easy/even on room air. Dressing to R foot c/d/i; sensation intact and able to wiggle toes. RR 5th toe amputation noted. Personal knee brace in place to RLE. 3D boot noted to RLE. Movement/sensation to all extremities present. Contact precautions in place. Whiteboard updated. All safety measures in place and personal belongings within reach.

## 2021-05-26 ENCOUNTER — Inpatient Hospital Stay: Payer: Self-pay

## 2021-05-26 LAB — BASIC METABOLIC PANEL
Anion gap: 9 (ref 5–15)
BUN: 11 mg/dL (ref 6–20)
CO2: 26 mmol/L (ref 22–32)
Calcium: 9.9 mg/dL (ref 8.9–10.3)
Chloride: 102 mmol/L (ref 98–111)
Creatinine, Ser: 0.68 mg/dL (ref 0.61–1.24)
GFR, Estimated: >60 mL/min (ref >60)
Glucose, Bld: 74 mg/dL (ref 70–99)
Potassium: 3.6 mmol/L (ref 3.5–5.1)
Sodium: 137 mmol/L (ref 135–145)

## 2021-05-26 LAB — AEROBIC/ANAEROBIC CULTURE W GRAM STAIN (SURGICAL/DEEP WOUND)
Culture: NO GROWTH
Gram Stain: NONE SEEN

## 2021-05-26 LAB — VANCOMYCIN, PEAK: Vancomycin Pk: 18 ug/mL — ABNORMAL LOW (ref 30–40)

## 2021-05-26 LAB — GLUCOSE, CAPILLARY: Glucose-Capillary: 98 mg/dL (ref 70–99)

## 2021-05-26 LAB — VANCOMYCIN, TROUGH: Vancomycin Tr: 9 ug/mL — ABNORMAL LOW (ref 15–20)

## 2021-05-26 MED ORDER — ISOSORBIDE MONONITRATE ER 30 MG PO TB24: 30.0000 mg | ORAL_TABLET | Freq: Every day | ORAL | 2 refills | Status: DC

## 2021-05-26 MED ORDER — SODIUM CHLORIDE 0.9% FLUSH
10.0000 mL | Freq: Two times a day (BID) | INTRAVENOUS | Status: DC
  Administered 2021-05-26: 30 mL

## 2021-05-26 MED ORDER — METRONIDAZOLE 500 MG PO TABS: 500.0000 mg | ORAL_TABLET | Freq: Two times a day (BID) | ORAL | 0 refills | Status: DC

## 2021-05-26 MED ORDER — SODIUM CHLORIDE 0.9 % IV SOLN
8.0000 mg/kg | Freq: Every day | INTRAVENOUS | Status: DC
  Administered 2021-05-26: 800 mg via INTRAVENOUS
  Filled 2021-05-26: qty 16

## 2021-05-26 MED ORDER — CHLORHEXIDINE GLUCONATE CLOTH 2 % EX PADS
6.0000 | MEDICATED_PAD | Freq: Every day | CUTANEOUS | Status: DC
  Administered 2021-05-26: 6 via TOPICAL

## 2021-05-26 MED ORDER — SODIUM CHLORIDE 0.9% FLUSH: 10.0000 mL | INTRAVENOUS | Status: DC | PRN

## 2021-05-26 MED ORDER — DAPTOMYCIN IV (FOR PTA / DISCHARGE USE ONLY): 800.0000 mg | INTRAVENOUS | 0 refills | Status: DC

## 2021-05-26 MED ORDER — ATORVASTATIN CALCIUM 10 MG PO TABS: 10.0000 mg | ORAL_TABLET | Freq: Every day | ORAL | 2 refills | Status: DC

## 2021-05-26 NOTE — Progress Notes (Signed)
Pharmacy Antibiotic Note  [name] is a 25 y.o. male admitted on 05/20/2021 with osteo.  Pharmacy has been consulted for Vanco dosing.  ID: R foot osteo/septic joint of 5th MT head/phalanx S/p partial 5th ray amputation on 5/29 S/p I&D with wound closure on 6/1 Afebrile, WBC down to 12.3  Vanc 5/28 >>  CTX 5/28 >> 6/2 Azactam x1 5/28   Flagyl 6/2 >> (6/15)  6/1 VP/VT 38/16, AUC 676 on 1500mg  q8 >> decr to 1g q8 for AUC 449 6/3: VP/VT 18/9, AUC 467 on 1g IV q8hr  Plan: Con't Vanc 1gm IV Q8H for AUC 467 Plan 6 wks of Vanco and 2 wks Flagyl      Height: 6\' 3"  (190.5 cm) Weight: 97.5 kg (214 lb 15.9 oz) IBW/kg (Calculated) : 84.5  Temp (24hrs), Avg:98.4 F (36.9 C), Min:98.2 F (36.8 C), Max:98.7 F (37.1 C)  Recent Labs  Lab 05/20/21 1011 05/20/21 1324 05/22/21 0050 05/23/21 0144 05/24/21 0156 05/24/21 0902 05/24/21 0933 05/24/21 1356 05/25/21 0330 05/26/21 0151 05/26/21 0702  WBC 18.5*  --   --   --   --   --  12.3*  --   --   --   --   CREATININE 0.72  --    < > 0.70 0.60*  --  0.65  --  0.60* 0.68  --   LATICACIDVEN 1.3 1.5  --   --   --   --   --   --   --   --   --   VANCOTROUGH  --   --   --   --   --   --   --  16  --   --  9*  VANCOPEAK  --   --   --   --   --  38  --   --   --  18*  --    < > = values in this interval not displayed.    Estimated Creatinine Clearance: 171.6 mL/min (by C-G formula based on SCr of 0.68 mg/dL).    Allergies  Allergen Reactions  . Amoxicillin Hives    Has patient had a PCN reaction causing immediate rash, facial/tongue/throat swelling, SOB or lightheadedness with hypotension: No Has patient had a PCN reaction causing severe rash involving mucus membranes or skin necrosis: No Has patient had a PCN reaction that required hospitalization: No Has patient had a PCN reaction occurring within the last 10 years: No If all of the above answers are "NO", then may proceed with Cephalosporin use.     Nova Evett S. Alford Highland,  PharmD, BCPS Clinical Staff Pharmacist [email or web address]  Wayland Salinas 05/26/2021 8:46 AM

## 2021-05-26 NOTE — Progress Notes (Signed)
PHARMACY CONSULT NOTE FOR:  OUTPATIENT  PARENTERAL ANTIBIOTIC THERAPY (OPAT)  Indication: Osteomyelitis/septoc arthritis Regimen: Daptomycin 800 mg every 24 hours + Flagyl 500 mg BID  End date: 07/05/21 for Dapto  06/07/21 for metronidazole  IV antibiotic discharge orders are pended. To discharging provider:  please sign these orders via discharge navigator,  Select New Orders & click on the button choice - Manage This Unsigned Work.     Thank you for allowing pharmacy to be a part of this patient's care.  Jimmy Footman, PharmD, BCPS, [place] [facility] Clinical Pharmacist Phone: [phone] 05/26/2021, 10:51 AM

## 2021-05-26 NOTE — Progress Notes (Signed)
Peripherally Inserted Central Catheter Placement  The IV Nurse has discussed with the patient and/or persons authorized to consent for the patient, the purpose of this procedure and the potential benefits and risks involved with this procedure.  The benefits include less needle sticks, lab draws from the catheter, and the patient may be discharged home with the catheter. Risks include, but not limited to, infection, bleeding, blood clot (thrombus formation), and puncture of an artery; nerve damage and irregular heartbeat and possibility to perform a PICC exchange if needed/ordered by physician.  Alternatives to this procedure were also discussed.  Bard Power PICC patient education guide, fact sheet on infection prevention and patient information card has been provided to patient /or left at bedside.    PICC Placement Documentation  PICC Single Lumen 05/26/21 Right Brachial 39 cm 0 cm (Active)  Indication for Insertion or Continuance of Line Home intravenous therapies (PICC only) 05/26/21 1200  Exposed Catheter (cm) 0 cm 05/26/21 1200  Site Assessment Clean;Dry;Intact 05/26/21 1200  Line Status Flushed;Blood return noted 05/26/21 1200  Dressing Type Transparent 05/26/21 1200  Dressing Status Clean;Dry;Intact 05/26/21 1200  Antimicrobial disc in place? Yes 05/26/21 1200  Dressing Change Due 06/02/21 05/26/21 1200       Jule Economy Horton 05/26/2021, 12:01 PM

## 2021-05-26 NOTE — Progress Notes (Signed)
RN teachings on discharged instructions completed. Pt said he has no questions at this time. Patient's  Parents are picking him up. Vital signs are stable.

## 2021-05-26 NOTE — Progress Notes (Signed)
Diagnosis: Osteomyelitis   Culture Result: MRSA  Allergies  Allergen Reactions  . Amoxicillin Hives    Has patient had a PCN reaction causing immediate rash, facial/tongue/throat swelling, SOB or lightheadedness with hypotension: No Has patient had a PCN reaction causing severe rash involving mucus membranes or skin necrosis: No Has patient had a PCN reaction that required hospitalization: No Has patient had a PCN reaction occurring within the last 10 years: No If all of the above answers are "NO", then may proceed with Cephalosporin use.     OPAT Orders Discharge antibiotics to be given via PICC line Discharge antibiotics: Daptomycin or Vancomcyin  Per pharmacy protocol Aim for Vancomycin trough 15-20 or AUC 400-550 (unless otherwise indicated) Duration: 6 weeks   End Date: 07/02/21  [facility] Care Per Protocol:  Home health RN for IV administration and teaching; PICC line care and labs.    Labs weekly while on IV antibiotics: X_ CBC with differential X_ BMP __ CMP X__ CRP X_ ESR X__ Vancomycin trough or  CK  X_ Please pull PIC at completion of IV antibiotics __ Please leave PIC in place until doctor has seen patient or been notified  Fax weekly labs to [phone]  Clinic Follow Up Appt: 06/20/21  @9 :30 am   Rosiland Oz, MD Infectious Disease Physician [facility] [address] [place], [place] [address] Phone: [phone]  Fax: [phone]

## 2021-05-26 NOTE — Plan of Care (Signed)
  Problem: Education: Goal: Knowledge of General Education information will improve Description: Including pain rating scale, medication(s)/side effects and non-pharmacologic comfort measures Outcome: Completed/Met   Problem: Elimination: Goal: Will not experience complications related to urinary retention Outcome: Completed/Met   Problem: Pain Managment: Goal: General experience of comfort will improve Outcome: Completed/Met   Problem: Safety: Goal: Ability to remain free from injury will improve Outcome: Completed/Met

## 2021-05-26 NOTE — Discharge Summary (Signed)
Physician Discharge Summary   Patient ID: [name] MRN: [id number] DOB/AGE: 1996-01-24 25 y.o.  Admit date: 05/20/2021 Discharge date: 05/26/2021  Primary Care Physician:  [name], MD   Recommendations for Outpatient Follow-up:  1. Follow up with PCP in 1-2 weeks 2. Follow weekly CBC with differential, BMP, ESR, CRP while on IV antibiotics 3. Daptomycin IV 800 mg daily, last day 07/05/2021 4. Metronidazole 500 mg twice daily for 14 days  Home Health: Home health for IV antibiotics Equipment/Devices:   Discharge Condition: stable  CODE STATUS: FULL  Diet recommendation: Carb modified diet   Discharge Diagnoses:    . 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 Uncontrolled type 1 diabetes mellitus with hyperglycemia ([place]) . Gastroesophageal reflux disease . HLD (hyperlipidemia) Hypertension Moderate degree malnutrition Lower extremity arterial insufficiency  Consults:  Infectious disease, Dr. West Bali Podiatry, Dr. March Rummage    Allergies:   Allergies  Allergen Reactions  . Amoxicillin Hives    Has patient had a PCN reaction causing immediate rash, facial/tongue/throat swelling, SOB or lightheadedness with hypotension: No Has patient had a PCN reaction causing severe rash involving mucus membranes or skin necrosis: No Has patient had a PCN reaction that required hospitalization: No Has patient had a PCN reaction occurring within the last 10 years: No If all of the above answers are "NO", then may proceed with Cephalosporin use.      DISCHARGE MEDICATIONS: Allergies as of 05/26/2021      Reactions   Amoxicillin Hives   Has patient had a PCN reaction causing immediate rash, facial/tongue/throat swelling, SOB or lightheadedness with hypotension: No Has patient had a PCN reaction causing severe rash involving mucus membranes or skin necrosis: No Has patient had a PCN reaction that required hospitalization: No Has  patient had a PCN reaction occurring within the last 10 years: No If all of the above answers are "NO", then may proceed with Cephalosporin use.      Medication List    TAKE these medications   accu-chek soft touch lancets Use as instructed   accu-chek softclix lancets Use as instructed 4 x daily. e11.65   aspirin EC 81 MG tablet Take 1 tablet (81 mg total) by mouth daily.   atorvastatin 10 MG tablet Commonly known as: LIPITOR Take 1 tablet (10 mg total) by mouth daily.   blood glucose meter kit and supplies Kit Dispense based on patient and insurance preference. Use up to four times daily as directed. (FOR ICD-10 E11.65).   CareTouch Pen Needles 31G X 6 MM Misc Generic drug: Insulin Pen Needle Use 6x a day   carvedilol 25 MG tablet Commonly known as: COREG Take 1 tablet (25 mg total) by mouth 2 (two) times daily.   daptomycin  IVPB Commonly known as: CUBICIN Inject 800 mg into the vein daily. Indication: Ostetmyelitis/septic arthritis First Dose: Yes Last Day of Therapy:  07/05/21 Labs - Once weekly:  CBC/D, BMP, and CPK Labs - Every other week:  ESR and CRP Method of administration: IV Push Method of administration may be changed at the discretion of home infusion pharmacist based upon assessment of the patient and/or caregiver's ability to self-administer the medication ordered.   HumaLOG KwikPen 200 UNIT/ML KwikPen Generic drug: insulin lispro Inject under skin 10-14 units 3x a day before meals What changed:   how much to take  how to take this  when to take this  additional instructions   hydrOXYzine 50 MG tablet Commonly  known as: ATARAX/VISTARIL Take 50 mg by mouth 3 (three) times daily as needed for anxiety.   ibuprofen 800 MG tablet Commonly known as: ADVIL TAKE 1 TABLET BY MOUTH EVERY 8 HOURS AS NEEDED What changed: reasons to take this   isosorbide mononitrate 30 MG 24 hr tablet Commonly known as: IMDUR Take 1 tablet (30 mg total) by mouth  daily.   Lantus SoloStar 100 UNIT/ML Solostar Pen Generic drug: insulin glargine Inject 35 Units into the skin 2 (two) times daily.   metroNIDAZOLE 500 MG tablet Commonly known as: FLAGYL Take 1 tablet (500 mg total) by mouth 2 (two) times daily for 14 days.   nitroGLYCERIN 0.4 MG SL tablet Commonly known as: Nitrostat Place 1 tablet (0.4 mg total) under the tongue every 5 (five) minutes as needed for chest pain.   omeprazole 20 MG capsule Commonly known as: PRILOSEC Take 20 mg by mouth daily.   Ozempic (0.25 or 0.5 MG/DOSE) 2 MG/1.5ML Sopn Generic drug: Semaglutide(0.25 or 0.5MG/DOS) INJECT 0.5 MG INTO THE SKIN ONCE A WEEK   zolpidem 10 MG tablet Commonly known as: AMBIEN Take 10 mg by mouth at bedtime.            Discharge Care Instructions  (From admission, onward)         Start     Ordered   05/26/21 0000  Change dressing on IV access line weekly and PRN  (Home infusion instructions - [facility] )        05/26/21 1107           Brief H and P: For complete details please refer to admission H and P, but in brief Patient is a 25 year old male with history of hypertension, diabetes, hyperlipidemia, mild LE arterial insufficiency, neuropathy, chronic right foot wound presented to ED secondary to increased drainage, swelling and redness on the lateral aspect of his right foot.  Patient reported that symptoms have been present for last 2 to 3 days prior to admission and were progressing. Right foot x-ray showed osteomyelitis/septic joint involving the fifth metatarsal head and base of the fifth proximal phalanx MRI right foot showed large soft tissue wound overlying the fifth metatarsal head.  Osteomyelitis of the fifth metatarsal head and base of the fifth proximal phalanx.  Complex fluid collection around the fifth MTP joint measuring 2.9x 2.5x 1.6 cm extending from the plantar aspect of the dorsal aspect consistent with an abscess. 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 -Wound cultures 5/29 showed MRSA, ID was consulted -Patient was placed on IV vancomycin.  ID recommended 6 weeks of IV daptomycin, oral metronidazole for 2 weeks.   -Discussed with Dr. March Rummage, patient has been cleared by podiatry for discharge home, outpatient follow-up in 1 week     HTN (hypertension) -BP stable, continue Coreg, Imdur    Uncontrolled type 1 diabetes mellitus with hyperglycemia ([place]), IDDM with osteomyelitis -CBG stable, -Continue Lantus, Humalog, Ozempic per outpatient regimen     HLD (hyperlipidemia) -Continue statin    Malnutrition of moderate degree secondary to chronic illness/diabetes Estimated body mass index is 26.87 kg/m as calculated from the following:   Height as of this encounter: _0  (1.905 m).   Weight as of this encounter: 97.5 kg. -Dietitian  consulted, placed on nutritional supplements  Lower extremity arterial insufficiency -Continue aspirin, Lipitor    Day of Discharge S: No acute complaints, cleared by podiatry.  Hoping to be discharged home today  BP 136/87 (BP Location: Right Arm)   Pulse 88   Temp 97.6 F (36.4 C) (Oral)   Resp 17   Ht $R'6\' 3"'xl$  (1.905 m)   Wt 97.5 kg   SpO2 100%   BMI 26.87 kg/m   Physical Exam: General: Alert and awake oriented x3 not in any acute distress. CVS: S1-S2 clear no murmur rubs or gallops Chest: clear to auscultation bilaterally, no wheezing rales or rhonchi Abdomen: soft nontender, nondistended, normal bowel sounds Extremities: Right foot dressing intact Neuro: no new deficits    Get Medicines reviewed and adjusted: Please take all your medications with you for your next visit with your Primary MD  Please request your  Primary MD to go over all hospital tests and procedure/radiological results at the follow up. Please ask your Primary MD to get all Hospital records sent to his/her office.  If you experience worsening of your admission symptoms, develop shortness of breath, life threatening emergency, suicidal or homicidal thoughts you must seek medical attention immediately by calling 911 or calling your MD immediately  if symptoms less severe.  You must read complete instructions/literature along with all the possible adverse reactions/side effects for all the Medicines you take and that have been prescribed to you. Take any new Medicines after you have completely understood and accept all the possible adverse reactions/side effects.   Do not drive when taking pain medications.   Do not take more than prescribed Pain, Sleep and Anxiety Medications  Special Instructions: If you have smoked or chewed Tobacco  in the last 2 yrs please stop smoking, stop any regular Alcohol  and or any Recreational drug use.  Wear Seat belts while driving.  Please note  You were cared for by a hospitalist during your hospital stay. Once you are discharged, your primary care physician will handle any further medical issues. Please note that NO REFILLS for any discharge medications will be authorized once you are discharged, as it is imperative that you return to your primary care physician (or establish a relationship with a primary care physician if you do not have one) for your aftercare needs so that they can reassess your need for medications and monitor your lab values.   The results of significant diagnostics from this hospitalization (including imaging, microbiology, ancillary and laboratory) are listed below for reference.      Procedures/Studies:  MR FOOT RIGHT W WO CONTRAST  Result Date: 05/21/2021 CLINICAL DATA:  Worsening infection and drainage of the right foot. EXAM: MRI OF THE RIGHT FOREFOOT WITHOUT AND WITH  CONTRAST TECHNIQUE: Multiplanar, multisequence MR imaging of the right forefoot was performed before and after the administration of intravenous contrast. CONTRAST:  68mL GADAVIST GADOBUTROL 1 MMOL/ML IV SOLN COMPARISON:  None. FINDINGS: Bones/Joint/Cartilage Large soft tissue wound overlying the fifth metatarsal head. Cortical destruction of the fifth metatarsal head with extensive bone marrow edema throughout the first metatarsal head and extending into the shaft most consistent with osteomyelitis. Bone destruction at the base of the fifth proximal phalanx with associated bone marrow edema consistent with osteomyelitis. Complex fluid collection centered around the fifth MTP joint measuring 2.9 x 2.5 x 1.6 cm extending from the plantar aspect to the dorsal aspect consistent with an abscess. Bone marrow edema in the fifth distal phalanx which may be  reactive versus secondary to osteomyelitis. Marrow edema in the head of fourth proximal phalanx without cortical destruction may reflect reactive edema versus early osteomyelitis. No acute fracture or dislocation. Normal alignment. No joint effusion. Ligaments Collateral ligaments are intact.  Lisfranc ligament is intact. Muscles and Tendons Flexor, peroneal and extensor compartment tendons are intact. Diffuse T2 hyperintense signal throughout the plantar musculature likely neurogenic. An element of myositis is not completely excluded. Soft tissue No other fluid collection or hematoma. No soft tissue mass. Severe soft tissue edema along the dorsal aspect of the foot concerning for cellulitis. IMPRESSION: 1. Large soft tissue wound overlying the fifth metatarsal head. Osteomyelitis of the fifth metatarsal head and base of the fifth proximal phalanx. Complex fluid collection centered around the fifth MTP joint measuring 2.9 x 2.5 x 1.6 cm extending from the plantar aspect to the dorsal aspect consistent with an abscess. Bone marrow edema in the fifth distal phalanx which  may be reactive versus secondary to osteomyelitis. 2. Marrow edema in the head of fourth proximal phalanx without cortical destruction may reflect reactive edema versus early osteomyelitis. 3. Severe soft tissue edema along the dorsal aspect of the foot concerning for cellulitis. Electronically Signed   By: Kathreen Devoid   On: 05/21/2021 10:58   DG Foot 2 Views Right  Result Date: 05/21/2021 CLINICAL DATA:  Status post surgery for osteomyelitis. EXAM: RIGHT FOOT - 2 VIEW COMPARISON:  05/20/2021. FINDINGS: The fifth toe and the fifth metatarsal from the midshaft distally have been resected. The resected end of the remaining fifth metatarsal is well-defined. Small focus of lucency with resorption of the overlying cortex, along the distal, lateral aspect of the proximal phalanx of the fourth toe. This is not evident on the previous day's study, which would argue against osteomyelitis. Still, a small focus of osteomyelitis is possible. There is evidence of a subtle overlying soft tissue defect. There are no additional areas of bone lucency or resorption to suggest osteomyelitis. Joints are normally spaced and aligned. No soft tissue air. IMPRESSION: 1. Status post resection of the fifth toe and the middle through distal aspect of the fifth metatarsal. 2. Small focus of lucency in the distal lateral aspect of the proximal phalanx of the fourth toe. This could potentially reflect a small focus osteomyelitis. It may be related to the recent surgery. Recommend follow-up imaging in 4-6 weeks to reassess. Electronically Signed   By: Lajean Manes M.D.   On: 05/21/2021 16:42   DG Foot Complete Right  Result Date: 05/20/2021 CLINICAL DATA:  Wound to distal 5th metatarsal EXAM: RIGHT FOOT COMPLETE - 3+ VIEW COMPARISON:  12/21/2020 FINDINGS: Soft tissue wound/ulceration adjacent to the distal 5th metatarsal head. Adjacent cortical irregularity/destruction involving the 5th metatarsal head and likely the lateral base of the  5th proximal phalanx, suggesting osteomyelitis/septic joint. Mild lateral soft tissue swelling involving the 5th digit. IMPRESSION: Osteomyelitis/septic joint involving the 5th metatarsal head and base of the 5th proximal phalanx. Electronically Signed   By: Julian Hy M.D.   On: 05/20/2021 10:40   [place] EKG SITE RITE  Result Date: 05/26/2021 If Site Rite image not attached, placement could not be confirmed due to current cardiac rhythm.  [place] EKG SITE RITE  Result Date: 05/25/2021 If Site Rite image not attached, placement could not be confirmed due to current cardiac rhythm.     LAB RESULTS: Basic Metabolic Panel: Recent Labs  Lab 05/25/21 0330 05/26/21 0151  NA 135 137  K 3.4* 3.6  CL  98 102  CO2 26 26  GLUCOSE 147* 74  BUN 11 11  CREATININE 0.60* 0.68  CALCIUM 9.4 9.9   Liver Function Tests: No results for input(s): AST, ALT, ALKPHOS, BILITOT, PROT, ALBUMIN in the last 168 hours. No results for input(s): LIPASE, AMYLASE in the last 168 hours. No results for input(s): AMMONIA in the last 168 hours. CBC: Recent Labs  Lab 05/20/21 1011 05/24/21 0933  WBC 18.5* 12.3*  NEUTROABS 14.7*  --   HGB 14.0 13.4  HCT 42.0 39.7  MCV 83.7 82.9  PLT 303 406*   Cardiac Enzymes: No results for input(s): CKTOTAL, CKMB, CKMBINDEX, TROPONINI in the last 168 hours. BNP: Invalid input(s): POCBNP CBG: Recent Labs  Lab 05/25/21 2056 05/26/21 0653  GLUCAP 105* 98       Disposition and Follow-up: Discharge Instructions    [facility] pharmacist to adjust dose for Vancomycin, Aminoglycosides and other anti-infective therapies as requested by physician.   Complete by: As directed    [facility] to provide Cath Flo 51m   Complete by: As directed    Administer for PICC line occlusion and as ordered by physician for other access device issues.   Anaphylaxis Kit: Provided to treat any anaphylactic reaction to the medication being provided to the patient if  First Dose or when requested by physician   Complete by: As directed    Epinephrine 182mml vial / amp: Administer 0.88m28m0.88ml79mubcutaneously once for moderate to severe anaphylaxis, nurse to call physician and pharmacy when reaction occurs and call 911 if needed for immediate care   Diphenhydramine 50mg60mIV vial: Administer 25-50mg 78mM PRN for first dose reaction, rash, itching, mild reaction, nurse to call physician and pharmacy when reaction occurs   Sodium Chloride 0.9% NS 500ml I50mdminister if needed for hypovolemic blood pressure drop or as ordered by physician after call to physician with anaphylactic reaction   Change dressing on IV access line weekly and PRN   Complete by: As directed    Flush IV access with Sodium Chloride 0.9% and Heparin 10 units/ml or 100 units/ml   Complete by: As directed    Home infusion instructions - [facility]   Complete by: As directed    Instructions: Flush IV access with Sodium Chloride 0.9% and Heparin 10units/ml or 100units/ml   Change dressing on IV access line: Weekly and PRN   Instructions Cath Flo 2mg: Ad10mister for PICC Line occlusion and as ordered by physician for other access device   [facility] pharmacist to adjust dose for: Vancomycin, Aminoglycosides and other anti-infective therapies as requested by physician   Increase activity slowly   Complete by: As directed    Method of administration may be changed at the discretion of home infusion pharmacist based upon assessment of the patient and/or caregiver's ability to self-administer the medication ordered   Complete by: As directed    No wound care   Complete by: As directed        DISPOSITION: Home with home health   [place], [place] SElwyn Ladehedule an appointment as soon as possible for a visit in 2 week(s).   Specialty: Family Medicine Contact information: [address] [address] [id number] [address] [address], MEvelina Bucychedule an appointment as soon as possible for a visit in 1 week(s).   Specialty: PodiatryHealth [address]ortion: [address]  [place] [address] [phone]        Rosiland Oz, MD Follow up on 06/20/2021.   Specialty: Infectious Diseases Why: at 9:30AM Contact information: [address] [place] [address] [phone]                Time coordinating discharge:  35 minutes  Signed:   Estill Cotta M.D. [facility] 05/26/2021, 3:04 PM

## 2021-05-26 NOTE — Progress Notes (Signed)
Nursing communication from pharmacy to not hang 0600 Vanc dose until trough is drawn. Verified with rx that trough has not been drawn and will hold until trough results.

## 2021-05-29 ENCOUNTER — Telehealth (HOSPITAL_COMMUNITY): Payer: Self-pay | Admitting: Physical Therapy

## 2021-05-29 NOTE — Telephone Encounter (Signed)
Pt's toe was removed and he wanted to cx all future appointments for wound care at this time.

## 2021-05-30 ENCOUNTER — Ambulatory Visit (HOSPITAL_COMMUNITY): Payer: BC Managed Care – PPO | Admitting: Physical Therapy

## 2021-06-01 ENCOUNTER — Ambulatory Visit (HOSPITAL_COMMUNITY): Payer: BC Managed Care – PPO | Admitting: Physical Therapy

## 2021-06-05 NOTE — Telephone Encounter (Signed)
I spoke to [facility] with [facility] in regards to patient receiving nursing for picc line care. Per Stanton Kidney patient should having [facility] nursing coming to see him. Stanton Kidney stated she would reach out to them in regards to the delay on seeing the patient.  I also spoke to the patient and he stated he finally received a call yesterday morning from [facility] stating they would be coming out to see him today. I have also advised [facility] with [facility]. Malyk Girouard T Brooks Sailors

## 2021-06-06 ENCOUNTER — Encounter (HOSPITAL_COMMUNITY): Payer: Self-pay | Admitting: Emergency Medicine

## 2021-06-06 ENCOUNTER — Other Ambulatory Visit: Payer: Self-pay

## 2021-06-06 ENCOUNTER — Ambulatory Visit: Payer: BC Managed Care – PPO | Admitting: Podiatry

## 2021-06-06 ENCOUNTER — Inpatient Hospital Stay (HOSPITAL_COMMUNITY)
Admission: EM | Admit: 2021-06-06 | Discharge: 2021-06-08 | DRG: 864 | Disposition: A | Discharge status: Home Health | Payer: BC Managed Care – PPO | Attending: Internal Medicine | Admitting: Internal Medicine

## 2021-06-06 ENCOUNTER — Emergency Department (HOSPITAL_COMMUNITY): Payer: BC Managed Care – PPO

## 2021-06-06 ENCOUNTER — Ambulatory Visit (HOSPITAL_COMMUNITY): Payer: BC Managed Care – PPO | Admitting: Physical Therapy

## 2021-06-06 DIAGNOSIS — E1043 Type 1 diabetes mellitus with diabetic autonomic (poly)neuropathy: Secondary | ICD-10-CM | POA: Diagnosis present

## 2021-06-06 DIAGNOSIS — Z880 Allergy status to penicillin: Secondary | ICD-10-CM

## 2021-06-06 DIAGNOSIS — Y929 Unspecified place or not applicable: Secondary | ICD-10-CM

## 2021-06-06 DIAGNOSIS — R6510 Systemic inflammatory response syndrome (SIRS) of non-infectious origin without acute organ dysfunction: Secondary | ICD-10-CM | POA: Diagnosis not present

## 2021-06-06 DIAGNOSIS — R509 Fever, unspecified: Principal | ICD-10-CM | POA: Diagnosis present

## 2021-06-06 DIAGNOSIS — Z833 Family history of diabetes mellitus: Secondary | ICD-10-CM

## 2021-06-06 DIAGNOSIS — Z8614 Personal history of Methicillin resistant Staphylococcus aureus infection: Secondary | ICD-10-CM

## 2021-06-06 DIAGNOSIS — T80219A Unspecified infection due to central venous catheter, initial encounter: Principal | ICD-10-CM | POA: Diagnosis present

## 2021-06-06 DIAGNOSIS — E10621 Type 1 diabetes mellitus with foot ulcer: Secondary | ICD-10-CM | POA: Diagnosis present

## 2021-06-06 DIAGNOSIS — Z794 Long term (current) use of insulin: Secondary | ICD-10-CM

## 2021-06-06 DIAGNOSIS — Z7722 Contact with and (suspected) exposure to environmental tobacco smoke (acute) (chronic): Secondary | ICD-10-CM | POA: Diagnosis present

## 2021-06-06 DIAGNOSIS — IMO0002 Reserved for concepts with insufficient information to code with codable children: Secondary | ICD-10-CM

## 2021-06-06 DIAGNOSIS — Z7982 Long term (current) use of aspirin: Secondary | ICD-10-CM

## 2021-06-06 DIAGNOSIS — M869 Osteomyelitis, unspecified: Secondary | ICD-10-CM | POA: Diagnosis present

## 2021-06-06 DIAGNOSIS — Y829 Unspecified medical devices associated with adverse incidents: Secondary | ICD-10-CM | POA: Diagnosis present

## 2021-06-06 DIAGNOSIS — E1069 Type 1 diabetes mellitus with other specified complication: Secondary | ICD-10-CM | POA: Diagnosis present

## 2021-06-06 DIAGNOSIS — E109 Type 1 diabetes mellitus without complications: Secondary | ICD-10-CM

## 2021-06-06 DIAGNOSIS — B999 Unspecified infectious disease: Secondary | ICD-10-CM | POA: Diagnosis present

## 2021-06-06 DIAGNOSIS — E785 Hyperlipidemia, unspecified: Secondary | ICD-10-CM | POA: Diagnosis present

## 2021-06-06 DIAGNOSIS — Z20822 Contact with and (suspected) exposure to covid-19: Secondary | ICD-10-CM | POA: Diagnosis present

## 2021-06-06 DIAGNOSIS — Z8249 Family history of ischemic heart disease and other diseases of the circulatory system: Secondary | ICD-10-CM

## 2021-06-06 DIAGNOSIS — E1065 Type 1 diabetes mellitus with hyperglycemia: Secondary | ICD-10-CM | POA: Diagnosis present

## 2021-06-06 DIAGNOSIS — Z79899 Other long term (current) drug therapy: Secondary | ICD-10-CM

## 2021-06-06 DIAGNOSIS — Z89421 Acquired absence of other right toe(s): Secondary | ICD-10-CM

## 2021-06-06 DIAGNOSIS — I10 Essential (primary) hypertension: Secondary | ICD-10-CM | POA: Diagnosis present

## 2021-06-06 DIAGNOSIS — E1051 Type 1 diabetes mellitus with diabetic peripheral angiopathy without gangrene: Secondary | ICD-10-CM | POA: Diagnosis present

## 2021-06-06 DIAGNOSIS — E1165 Type 2 diabetes mellitus with hyperglycemia: Secondary | ICD-10-CM

## 2021-06-06 DIAGNOSIS — R011 Cardiac murmur, unspecified: Secondary | ICD-10-CM | POA: Diagnosis present

## 2021-06-06 LAB — I-STAT VENOUS BLOOD GAS, ED
Acid-Base Excess: 2 mmol/L (ref 0.0–2.0)
Bicarbonate: 24.6 mmol/L (ref 20.0–28.0)
Calcium, Ion: 1.11 mmol/L — ABNORMAL LOW (ref 1.15–1.40)
HCT: 42 % (ref 39.0–52.0)
Hemoglobin: 14.3 g/dL (ref 13.0–17.0)
O2 Saturation: 75 %
Potassium: 3.9 mmol/L (ref 3.5–5.1)
Sodium: 134 mmol/L — ABNORMAL LOW (ref 135–145)
TCO2: 26 mmol/L (ref 22–32)
pCO2, Ven: 30.3 mmHg — ABNORMAL LOW (ref 44.0–60.0)
pH, Ven: 7.518 — ABNORMAL HIGH (ref 7.250–7.430)
pO2, Ven: 35 mmHg (ref 32.0–45.0)

## 2021-06-06 LAB — URINALYSIS, ROUTINE W REFLEX MICROSCOPIC
Bilirubin Urine: NEGATIVE
Glucose, UA: 150 mg/dL — AB
Hgb urine dipstick: NEGATIVE
Ketones, ur: 20 mg/dL — AB
Leukocytes,Ua: NEGATIVE
Nitrite: NEGATIVE
Protein, ur: 30 mg/dL — AB
Specific Gravity, Urine: 1.024 (ref 1.005–1.030)
pH: 6 (ref 5.0–8.0)

## 2021-06-06 LAB — CBC WITH DIFFERENTIAL/PLATELET
Abs Immature Granulocytes: 0.05 10*3/uL (ref 0.00–0.07)
Basophils Absolute: 0.1 10*3/uL (ref 0.0–0.1)
Basophils Relative: 1 %
Eosinophils Absolute: 0 10*3/uL (ref 0.0–0.5)
Eosinophils Relative: 0 %
HCT: 41.8 % (ref 39.0–52.0)
Hemoglobin: 13.8 g/dL (ref 13.0–17.0)
Immature Granulocytes: 0 %
Lymphocytes Relative: 4 %
Lymphs Abs: 0.5 10*3/uL — ABNORMAL LOW (ref 0.7–4.0)
MCH: 27.7 pg (ref 26.0–34.0)
MCHC: 33 g/dL (ref 30.0–36.0)
MCV: 83.9 fL (ref 80.0–100.0)
Monocytes Absolute: 0.6 10*3/uL (ref 0.1–1.0)
Monocytes Relative: 5 %
Neutro Abs: 10.8 10*3/uL — ABNORMAL HIGH (ref 1.7–7.7)
Neutrophils Relative %: 90 %
Platelets: 260 10*3/uL (ref 150–400)
RBC: 4.98 MIL/uL (ref 4.22–5.81)
RDW: 13.2 % (ref 11.5–15.5)
WBC: 12 10*3/uL — ABNORMAL HIGH (ref 4.0–10.5)
nRBC: 0 % (ref 0.0–0.2)

## 2021-06-06 LAB — COMPREHENSIVE METABOLIC PANEL
ALT: 48 U/L — ABNORMAL HIGH (ref 0–44)
AST: 36 U/L (ref 15–41)
Albumin: 3.6 g/dL (ref 3.5–5.0)
Alkaline Phosphatase: 71 U/L (ref 38–126)
Anion gap: 13 (ref 5–15)
BUN: 12 mg/dL (ref 6–20)
CO2: 24 mmol/L (ref 22–32)
Calcium: 9.6 mg/dL (ref 8.9–10.3)
Chloride: 97 mmol/L — ABNORMAL LOW (ref 98–111)
Creatinine, Ser: 0.86 mg/dL (ref 0.61–1.24)
GFR, Estimated: >60 mL/min (ref >60)
Glucose, Bld: 275 mg/dL — ABNORMAL HIGH (ref 70–99)
Potassium: 3.8 mmol/L (ref 3.5–5.1)
Sodium: 134 mmol/L — ABNORMAL LOW (ref 135–145)
Total Bilirubin: 1.1 mg/dL (ref 0.3–1.2)
Total Protein: 7.9 g/dL (ref 6.5–8.1)

## 2021-06-06 LAB — TROPONIN I (HIGH SENSITIVITY)
Troponin I (High Sensitivity): 4 ng/L (ref <18)
Troponin I (High Sensitivity): 4 ng/L (ref <18)

## 2021-06-06 LAB — RESP PANEL BY RT-PCR (FLU A&B, COVID) ARPGX2
Influenza A by PCR: NEGATIVE
Influenza B by PCR: NEGATIVE
SARS Coronavirus 2 by RT PCR: NEGATIVE

## 2021-06-06 IMAGING — DX DG CHEST 1V PORT
1 series · 1 of 1 positions shown · non-contrast
Comparison: [DATE]

CLINICAL DATA: Weakness, nausea, and vomiting after PICC line
change. Tachycardia.

EXAM:
PORTABLE CHEST 1 VIEW

[chest]
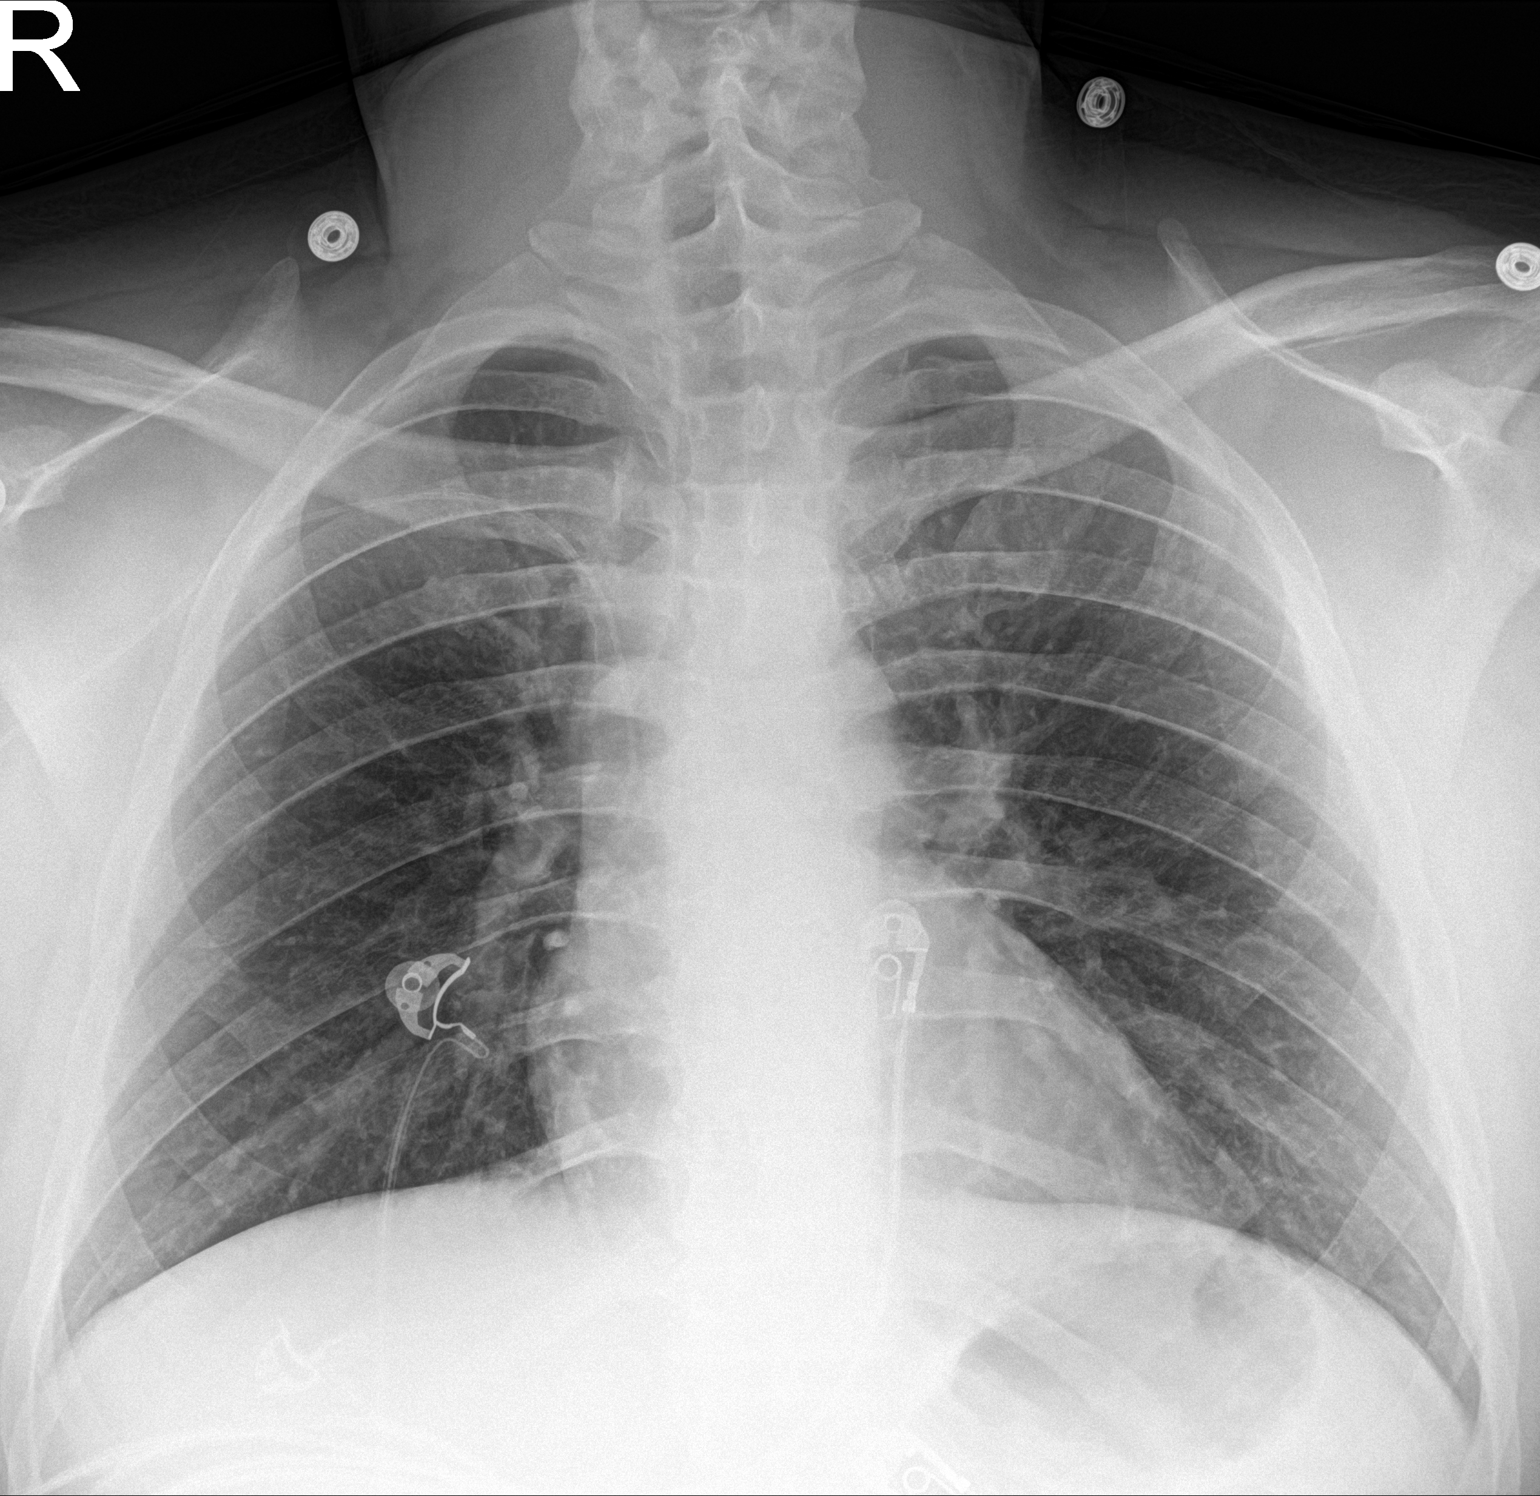

[1 of 1 positions shown; findings below may reference images not displayed]

FINDINGS: Right PICC line with tip over the low SVC region. No pneumothorax.
Heart size and pulmonary vascularity are normal. Lungs are clear.
Mediastinal contours appear intact. No pleural effusions.
IMPRESSION: Right PICC line appears in satisfactory position.  Lungs are clear.

## 2021-06-06 MED ORDER — METOCLOPRAMIDE HCL 5 MG/ML IJ SOLN
10.0000 mg | Freq: Once | INTRAMUSCULAR | Status: AC
  Administered 2021-06-06: 10 mg via INTRAVENOUS
  Filled 2021-06-06: qty 2

## 2021-06-06 MED ORDER — LACTATED RINGERS IV BOLUS
1000.0000 mL | Freq: Once | INTRAVENOUS | Status: AC
  Administered 2021-06-06: 1000 mL via INTRAVENOUS

## 2021-06-06 NOTE — ED Triage Notes (Signed)
Patient presents to the ED by [facility] with c/o weakness, nausea and vomiting after his home health nurse changed his PICC line.  The pt reports history of MI in 2016 and type 1 DM. Alert and oriented x4

## 2021-06-06 NOTE — ED Provider Notes (Signed)
[facility] EMERGENCY DEPARTMENT Provider Note   CSN: [id number] Arrival date & time: 06/06/21  1928     History Chief Complaint  Patient presents with  . Weakness  . Nausea    [name] is a 25 y.o. male.  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  Patient reports he had been doing pretty well until yesterday the home health nurse came out and change the bandage on his PICC line.  Since that time he states he started to feel nauseated and just very tired and weak.  He did have some chills last night and today but did not check his temperature because he does not own a thermometer.  Because of the nausea he ate nothing today but his mom did give him a Glucerna.  He has not taken his medications except for the IV PICC line once.  He has been urinating and denies any diarrhea.  He has no localized abdominal pain but did vomit before EMS arrived and had a second episode of emesis here.  It has been nonbloody.  He denies any cough or shortness of breath.  No chest pain.   Weakness     Past Medical History:  Diagnosis Date  . Complication of anesthesia    Hard to wake up when he was younger once.  . Diabetes mellitus, type II ([place])   . Heart murmur   . Hypertension     Patient Active Problem List   Diagnosis Date Noted  . Malnutrition of moderate degree 05/24/2021  . Planned postoperative wound closure   . Acute osteomyelitis of metatarsal bone of right foot ([place]) 05/20/2021  . HLD (hyperlipidemia) 05/20/2021  . Anxiety 12/21/2020  . Atypical chest pain 12/21/2020  . Body mass index (BMI) 30.0-30.9, adult 12/21/2020  . Chronic pain 12/21/2020  .  Disorder of teeth and supporting structures, unspecified 12/21/2020  . Epigastric pain 12/21/2020  . Gastroesophageal reflux disease 12/21/2020  . Headache disorder 12/21/2020  . Insomnia 12/21/2020  . Leukocytosis 12/21/2020  . Oral mucositis (ulcerative), unspecified 12/21/2020  . Panic disorder 12/21/2020  . Poor sleep pattern 12/21/2020  . Sinus tachycardia 12/21/2020  . Systolic murmur 63/78/5885  . Uncontrolled type 1 diabetes mellitus with hyperglycemia ([place]) 11/26/2018  . Personal history of noncompliance with medical treatment, presenting hazards to health 03/15/2017  . HTN (hypertension) 03/15/2017  . Low back pain 03/14/2017  . Neck pain 03/14/2017  . Uncontrolled diabetes mellitus with diabetic autonomic neuropathy, with long-term current use of insulin ([place]) 03/01/2017  . Type 2 diabetes mellitus with hyperglycemia ([place]) 10/19/2015    Past Surgical History:  Procedure Laterality Date  . AMPUTATION Right 05/21/2021   Procedure: AMPUTATION RAY 5th;  Surgeon: Trula Slade, DPM;  Location: [facility];  Service: Podiatry;  Laterality: Right;  . PALATE / UVULA BIOPSY / EXCISION     "growth removed"  . UPPER GASTROINTESTINAL ENDOSCOPY     Done in [place] - 2013 ish  . WOUND DEBRIDEMENT Right 05/24/2021   Procedure: RIGHT FOOT WOUND DEBRIDEMENT AND CLOSURE;  Surgeon: Evelina Bucy, DPM;  Location: [facility];  Service: Podiatry;  Laterality: Right;       Family History  Problem Relation Age of Onset  . Diabetes Maternal Grandmother   . Thyroid disease Maternal Grandmother   . Hypertension Maternal Grandmother   . Healthy Mother   . Healthy Father   . Healthy Sister   . Healthy Sister     Social History   Tobacco Use  . Smoking status: Passive Smoke Exposure - Never Smoker  . Smokeless tobacco: Never  Vaping Use  . Vaping Use: Never used  Substance Use Topics  . Alcohol use: No  . Drug use: No    Home Medications Prior to Admission medications    Medication Sig Start Date End Date Taking? Authorizing Provider  aspirin EC 81 MG tablet Take 1 tablet (81 mg total) by mouth daily. 09/13/20   Verta Ellen., NP  atorvastatin (LIPITOR) 10 MG tablet Take 1 tablet (10 mg total) by mouth daily. 05/26/21 08/24/21  Rai, Vernelle Emerald, MD  blood glucose meter kit and supplies KIT Dispense based on patient and insurance preference. Use up to four times daily as directed. (FOR ICD-10 E11.65). 01/13/19   Cassandria Anger, MD  carvedilol (COREG) 25 MG tablet Take 1 tablet (25 mg total) by mouth 2 (two) times daily. 12/01/18   Herminio Commons, MD  daptomycin (CUBICIN) IVPB Inject 800 mg into the vein daily. Indication: Ostetmyelitis/septic arthritis First Dose: Yes Last Day of Therapy:  07/05/21 Labs - Once weekly:  CBC/D, BMP, and CPK Labs - Every other week:  ESR and CRP Method of administration: IV Push Method of administration may be changed at the discretion of home infusion pharmacist based upon assessment of the patient and/or caregiver's ability to self-administer the medication ordered. 05/26/21 07/05/21  Rai, Vernelle Emerald, MD  hydrOXYzine (ATARAX/VISTARIL) 50 MG tablet Take 50 mg by mouth 3 (three) times daily as needed for anxiety.    [provider]  ibuprofen (ADVIL) 800 MG tablet TAKE 1 TABLET BY MOUTH EVERY 8 HOURS AS NEEDED Patient taking differently: Take 800 mg by mouth every 8 (eight) hours as needed for mild pain. 05/10/21   Carole Civil, MD  insulin glargine (LANTUS SOLOSTAR) 100 UNIT/ML Solostar Pen Inject 35 Units into the skin 2 (two) times daily. 10/18/20   Philemon Kingdom, MD  insulin lispro (HUMALOG KWIKPEN) 200 UNIT/ML KwikPen Inject under skin 10-14 units 3x a day before meals Patient taking differently: Inject 15 Units into the skin 3 (three) times daily before meals. 10/18/20   Philemon Kingdom, MD  Insulin Pen Needle (CARETOUCH PEN NEEDLES) 31G X 6 MM MISC Use 6x a day 10/18/20   Philemon Kingdom, MD   isosorbide mononitrate (IMDUR) 30 MG 24 hr tablet Take 1 tablet (30 mg total) by mouth daily. 05/26/21 08/24/21  Rai, Vernelle Emerald, MD  Lancet Devices [facility]) lancets Use as instructed 4 x daily. e11.65 04/03/17   Cassandria Anger, MD  Lancets (ACCU-CHEK SOFT TOUCH) lancets Use as instructed 08/10/18   Isla Pence, MD  metroNIDAZOLE (FLAGYL) 500 MG tablet Take 1 tablet (500 mg total) by mouth 2 (two) times daily for 14 days. 05/26/21 06/09/21  Rai, Vernelle Emerald, MD  nitroGLYCERIN (NITROSTAT) 0.4 MG SL tablet Place 1 tablet (0.4 mg total) under the tongue every 5 (five) minutes as needed for chest pain. 09/13/20 12/12/20  Verta Ellen., NP  omeprazole (PRILOSEC) 20 MG capsule Take 20 mg by mouth daily.  10/16/18   [provider]  OZEMPIC, 0.25 OR 0.5 MG/DOSE, 2 MG/1.5ML SOPN INJECT 0.5 MG INTO  THE SKIN ONCE A WEEK 03/22/21   Philemon Kingdom, MD  zolpidem (AMBIEN) 10 MG tablet Take 10 mg by mouth at bedtime. 08/17/20   [provider]    Allergies    Amoxicillin  Review of Systems   Review of Systems  Neurological:  Positive for weakness.  All other systems reviewed and are negative.  Physical Exam Updated Vital Signs BP 113/80   Pulse (!) 149   Temp 99.5 F (37.5 C) (Oral)   Resp (!) 22   SpO2 99%   Physical Exam Vitals and nursing note reviewed.  Constitutional:      General: He is not in acute distress.    Appearance: He is well-developed.  HENT:     Head: Normocephalic and atraumatic.     Mouth/Throat:     Mouth: Mucous membranes are dry.  Eyes:     Conjunctiva/sclera: Conjunctivae normal.     Pupils: Pupils are equal, round, and reactive to light.  Cardiovascular:     Rate and Rhythm: Regular rhythm. Tachycardia present.     Heart sounds: No murmur heard. Pulmonary:     Effort: Pulmonary effort is normal. No respiratory distress.     Breath sounds: Normal breath sounds. No wheezing or rales.  Chest:     Chest wall: No tenderness.   Abdominal:     General: There is no distension.     Palpations: Abdomen is soft.     Tenderness: There is no abdominal tenderness. There is no guarding or rebound.  Musculoskeletal:        General: No tenderness. Normal range of motion.     Cervical back: Normal range of motion and neck supple.     Right lower leg: No edema.     Left lower leg: No edema.     Comments: Sutures are in place in the right foot with no surrounding erythema, warmth or drainage the right foot without any erythema warmth or drainage  Skin:    General: Skin is warm and dry.     Findings: No erythema or rash.  Neurological:     Mental Status: He is alert and oriented to person, place, and time. Mental status is at baseline.  Psychiatric:        Mood and Affect: Mood normal.        Behavior: Behavior normal.    ED Results / Procedures / Treatments   Labs (all labs ordered are listed, but only abnormal results are displayed) Labs Reviewed  CBC WITH DIFFERENTIAL/PLATELET - Abnormal; Notable for the following components:      Result Value   WBC 12.0 (*)    Neutro Abs 10.8 (*)    Lymphs Abs 0.5 (*)    All other components within normal limits  COMPREHENSIVE METABOLIC PANEL - Abnormal; Notable for the following components:   Sodium 134 (*)    Chloride 97 (*)    Glucose, Bld 275 (*)    ALT 48 (*)    All other components within normal limits  URINALYSIS, ROUTINE W REFLEX MICROSCOPIC - Abnormal; Notable for the following components:   Glucose, UA 150 (*)    Ketones, ur 20 (*)    Protein, ur 30 (*)    Bacteria, UA RARE (*)    All other components within normal limits  I-STAT VENOUS BLOOD GAS, ED - Abnormal; Notable for the following components:   pH, Ven 7.518 (*)    pCO2, Ven 30.3 (*)    Sodium 134 (*)  Calcium, Ion 1.11 (*)    All other components within normal limits  RESP PANEL BY RT-PCR (FLU A&B, COVID) [id number]  TROPONIN I (HIGH SENSITIVITY)  TROPONIN I (HIGH SENSITIVITY)    EKG EKG  Interpretation  Date/Time:  Tuesday June 06 2021 19:49:12 EDT Ventricular Rate:  150 PR Interval:  124 QRS Duration: 88 QT Interval:  266 QTC Calculation: 421 R Axis:   -18 Text Interpretation: Sinus tachycardia Borderline left axis deviation Low voltage, precordial leads Baseline wander in lead(s) V6 Confirmed by Blanchie Dessert [phone]) on 06/06/2021 8:31:10 PM  Radiology No results found.  Procedures Procedures   Medications Ordered in ED Medications  lactated ringers bolus 1,000 mL (has no administration in time range)  metoCLOPramide (REGLAN) injection 10 mg (has no administration in time range)    ED Course  I have reviewed the triage vital signs and the nursing notes.  Pertinent labs & imaging results that were available during my care of the patient were reviewed by me and considered in my medical decision making (see chart for details).    MDM Rules/Calculators/A&P                          25 year old male with multiple medical problems presenting today with nausea now for approximately 24 hours and now vomiting.  Patient has felt chilled today but does not have a thermometer to take his temperature.  He denies productive cough but does complain of some left ear pain.  He recently had amputation due to osteomyelitis and is currently on IV daptomycin and oral Flagyl.  He has been taking those meds and reports he started feeling bad yesterday after the bandage was changed on his PICC line.  The PICC line appears normal in his right upper extremity.  Patient is tachycardic here but awake and alert and otherwise appropriate.  Concern for dehydration given patient has not eaten or drank anything today.  Also because he is diabetic and has not taken his medication concern for possible DKA.  Patient was afebrile here at 99.5 orally.  He has no localized abdominal pain.  The wound  is well-appearing without signs of infection.  Labs are pending.  Patient given IV fluids and nausea  medication.  EKG shows sinus tachycardia but no other acute findings.  11:14 PM Patient's labs are relatively reassuring with normal troponin, normal UA except for 20 ketones, CBC with leukocytosis of 12 but no other acute findings, CMP with blood sugar of 245 but normal potassium anion gap of 13.  VBG without signs of DKA today.  COVID is negative.  Patient received 2 L of fluid given the tachycardia and borderline blood pressures.  His heart rate remains 120-130, blood pressure is borderline between 90 and 974 systolic which normally he reports that his blood pressure is high.  Patient does take Imdur and carvedilol which he took prior to coming but then vomited 5 minutes later and feels sure that he vomited everything up.  Tachycardia may be related to some reflex tachycardia because he has not had beta-blockers today however with recent hospitalization, surgery feel that patient needs to be ruled out for PE.  On repeat evaluation after fluids he is looking better.  Final Clinical Impression(s) / ED Diagnoses Final diagnoses:  None    Rx / DC Orders ED Discharge Orders     None        Blanchie Dessert, MD 06/09/21 1343

## 2021-06-07 ENCOUNTER — Encounter (HOSPITAL_COMMUNITY): Payer: Self-pay | Admitting: Internal Medicine

## 2021-06-07 ENCOUNTER — Emergency Department (HOSPITAL_COMMUNITY): Payer: BC Managed Care – PPO

## 2021-06-07 ENCOUNTER — Inpatient Hospital Stay (HOSPITAL_COMMUNITY): Payer: BC Managed Care – PPO

## 2021-06-07 DIAGNOSIS — R011 Cardiac murmur, unspecified: Secondary | ICD-10-CM | POA: Diagnosis present

## 2021-06-07 DIAGNOSIS — Z7722 Contact with and (suspected) exposure to environmental tobacco smoke (acute) (chronic): Secondary | ICD-10-CM | POA: Diagnosis present

## 2021-06-07 DIAGNOSIS — Y829 Unspecified medical devices associated with adverse incidents: Secondary | ICD-10-CM | POA: Diagnosis present

## 2021-06-07 DIAGNOSIS — Z8249 Family history of ischemic heart disease and other diseases of the circulatory system: Secondary | ICD-10-CM | POA: Diagnosis not present

## 2021-06-07 DIAGNOSIS — E785 Hyperlipidemia, unspecified: Secondary | ICD-10-CM | POA: Diagnosis present

## 2021-06-07 DIAGNOSIS — E10621 Type 1 diabetes mellitus with foot ulcer: Secondary | ICD-10-CM | POA: Diagnosis present

## 2021-06-07 DIAGNOSIS — E1069 Type 1 diabetes mellitus with other specified complication: Secondary | ICD-10-CM | POA: Diagnosis present

## 2021-06-07 DIAGNOSIS — Z880 Allergy status to penicillin: Secondary | ICD-10-CM | POA: Diagnosis not present

## 2021-06-07 DIAGNOSIS — E1065 Type 1 diabetes mellitus with hyperglycemia: Secondary | ICD-10-CM | POA: Diagnosis present

## 2021-06-07 DIAGNOSIS — T80219A Unspecified infection due to central venous catheter, initial encounter: Secondary | ICD-10-CM | POA: Diagnosis present

## 2021-06-07 DIAGNOSIS — E1051 Type 1 diabetes mellitus with diabetic peripheral angiopathy without gangrene: Secondary | ICD-10-CM | POA: Diagnosis present

## 2021-06-07 DIAGNOSIS — B999 Unspecified infectious disease: Secondary | ICD-10-CM | POA: Diagnosis present

## 2021-06-07 DIAGNOSIS — M869 Osteomyelitis, unspecified: Secondary | ICD-10-CM | POA: Diagnosis present

## 2021-06-07 DIAGNOSIS — Z79899 Other long term (current) drug therapy: Secondary | ICD-10-CM | POA: Diagnosis not present

## 2021-06-07 DIAGNOSIS — R6510 Systemic inflammatory response syndrome (SIRS) of non-infectious origin without acute organ dysfunction: Secondary | ICD-10-CM | POA: Diagnosis present

## 2021-06-07 DIAGNOSIS — I10 Essential (primary) hypertension: Secondary | ICD-10-CM | POA: Diagnosis present

## 2021-06-07 DIAGNOSIS — E1043 Type 1 diabetes mellitus with diabetic autonomic (poly)neuropathy: Secondary | ICD-10-CM | POA: Diagnosis present

## 2021-06-07 DIAGNOSIS — R509 Fever, unspecified: Secondary | ICD-10-CM | POA: Diagnosis present

## 2021-06-07 DIAGNOSIS — Z794 Long term (current) use of insulin: Secondary | ICD-10-CM | POA: Diagnosis not present

## 2021-06-07 DIAGNOSIS — Z7982 Long term (current) use of aspirin: Secondary | ICD-10-CM | POA: Diagnosis not present

## 2021-06-07 DIAGNOSIS — Z20822 Contact with and (suspected) exposure to covid-19: Secondary | ICD-10-CM | POA: Diagnosis present

## 2021-06-07 DIAGNOSIS — Z89421 Acquired absence of other right toe(s): Secondary | ICD-10-CM | POA: Diagnosis not present

## 2021-06-07 DIAGNOSIS — Z833 Family history of diabetes mellitus: Secondary | ICD-10-CM | POA: Diagnosis not present

## 2021-06-07 DIAGNOSIS — Z8614 Personal history of Methicillin resistant Staphylococcus aureus infection: Secondary | ICD-10-CM | POA: Diagnosis not present

## 2021-06-07 DIAGNOSIS — Y929 Unspecified place or not applicable: Secondary | ICD-10-CM | POA: Diagnosis not present

## 2021-06-07 LAB — COMPREHENSIVE METABOLIC PANEL
ALT: 39 U/L (ref 0–44)
AST: 28 U/L (ref 15–41)
Albumin: 3 g/dL — ABNORMAL LOW (ref 3.5–5.0)
Alkaline Phosphatase: 51 U/L (ref 38–126)
Anion gap: 13 (ref 5–15)
BUN: 9 mg/dL (ref 6–20)
CO2: 23 mmol/L (ref 22–32)
Calcium: 8.6 mg/dL — ABNORMAL LOW (ref 8.9–10.3)
Chloride: 97 mmol/L — ABNORMAL LOW (ref 98–111)
Creatinine, Ser: 0.85 mg/dL (ref 0.61–1.24)
GFR, Estimated: >60 mL/min (ref >60)
Glucose, Bld: 299 mg/dL — ABNORMAL HIGH (ref 70–99)
Potassium: 3.9 mmol/L (ref 3.5–5.1)
Sodium: 133 mmol/L — ABNORMAL LOW (ref 135–145)
Total Bilirubin: 1.2 mg/dL (ref 0.3–1.2)
Total Protein: 6.6 g/dL (ref 6.5–8.1)

## 2021-06-07 LAB — GLUCOSE, CAPILLARY
Glucose-Capillary: 271 mg/dL — ABNORMAL HIGH (ref 70–99)
Glucose-Capillary: 172 mg/dL — ABNORMAL HIGH (ref 70–99)
Glucose-Capillary: 78 mg/dL (ref 70–99)
Glucose-Capillary: 84 mg/dL (ref 70–99)
Glucose-Capillary: 189 mg/dL — ABNORMAL HIGH (ref 70–99)

## 2021-06-07 LAB — TSH: TSH: 0.277 u[IU]/mL — ABNORMAL LOW (ref 0.350–4.500)

## 2021-06-07 LAB — LACTIC ACID, PLASMA
Lactic Acid, Venous: 2.5 mmol/L (ref 0.5–1.9)
Lactic Acid, Venous: 1.4 mmol/L (ref 0.5–1.9)
Lactic Acid, Venous: 1.2 mmol/L (ref 0.5–1.9)

## 2021-06-07 LAB — CBC
HCT: 41.1 % (ref 39.0–52.0)
Hemoglobin: 13.6 g/dL (ref 13.0–17.0)
MCH: 28 pg (ref 26.0–34.0)
MCHC: 33.1 g/dL (ref 30.0–36.0)
MCV: 84.7 fL (ref 80.0–100.0)
Platelets: 226 10*3/uL (ref 150–400)
RBC: 4.85 MIL/uL (ref 4.22–5.81)
RDW: 13 % (ref 11.5–15.5)
WBC: 13.9 10*3/uL — ABNORMAL HIGH (ref 4.0–10.5)
nRBC: 0 % (ref 0.0–0.2)

## 2021-06-07 LAB — PROCALCITONIN: Procalcitonin: 8.8 ng/mL

## 2021-06-07 IMAGING — MR MR FOOT*R* WO/W CM
4 of 9 series · 18 of 40 positions shown · IV contrast (gadavist)
Comparison: Radiographs and MRI [DATE].

CLINICAL DATA: Partial amputation of the right 5th metatarsal
[DATE] with wound debridement [DATE]. On antibiotics.
Concern for osteomyelitis.

EXAM:
MRI OF THE RIGHT FOREFOOT WITHOUT AND WITH CONTRAST
TECHNIQUE: Multiplanar, multisequence MR imaging of the right forefoot was
performed before and after the administration of intravenous
contrast.
CONTRAST:  9.7mL GADAVIST GADOBUTROL 1 MMOL/ML IV SOLN

[Series 6: T2 fat-sat · coronal · 3.0mm · 0.29mm/px · 5 of 52 slices shown (1 of 2)]
[im 1/52]
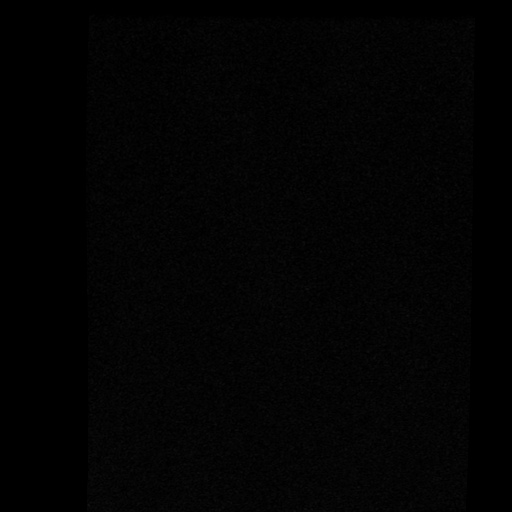
[im 13/52]
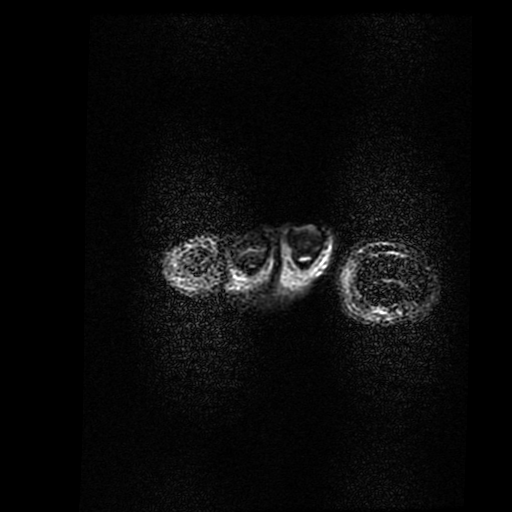
[im 26/52]
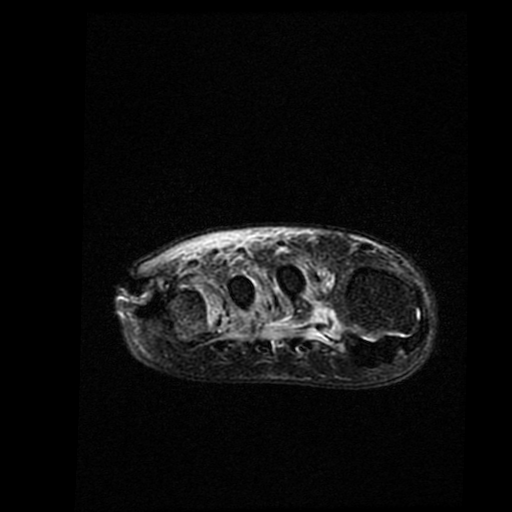
[im 39/52]
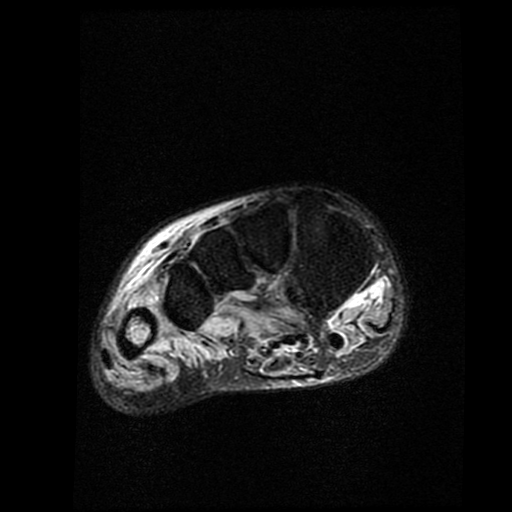
[im 52/52]
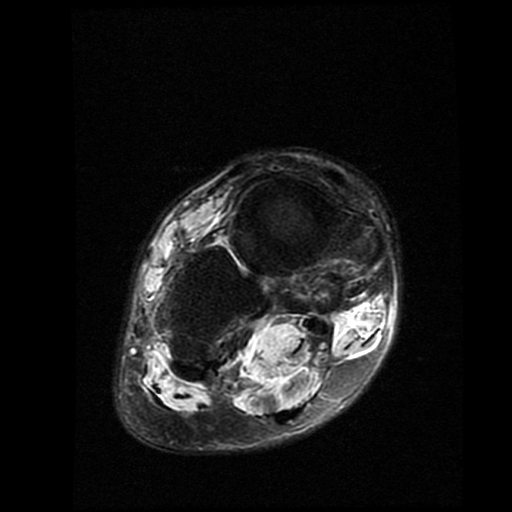

[Series 7: T1 · coronal · 3.0mm · 0.29mm/px · 6 of 52 slices shown]
[im 1/52]
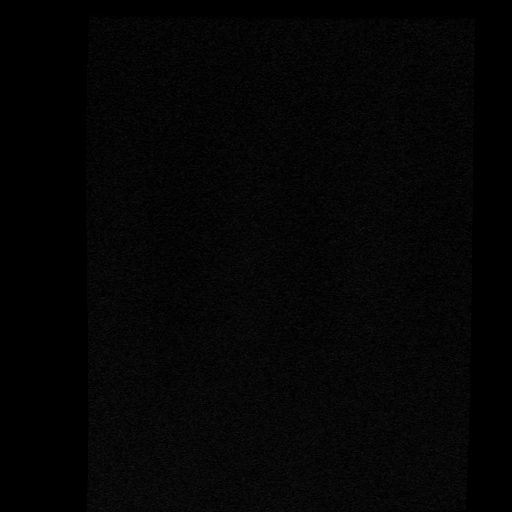
[im 11/52]
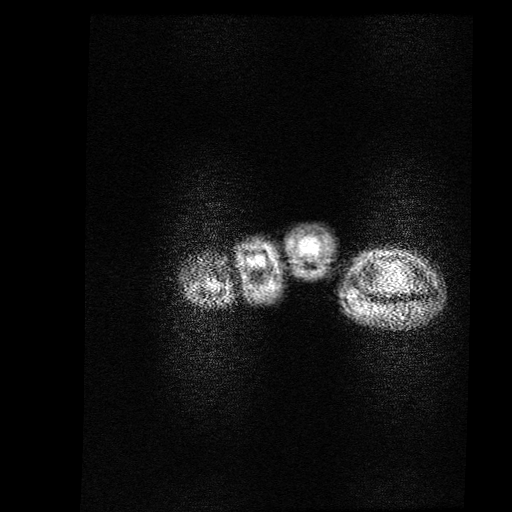
[im 21/52]
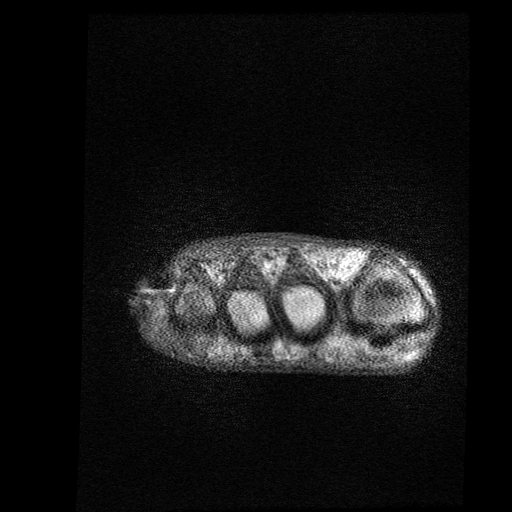
[im 31/52]
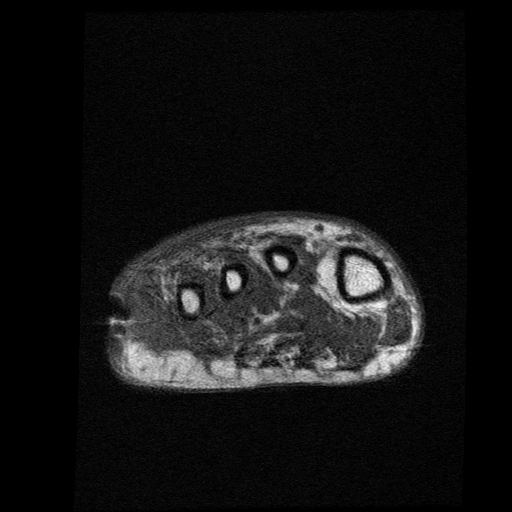
[im 41/52]
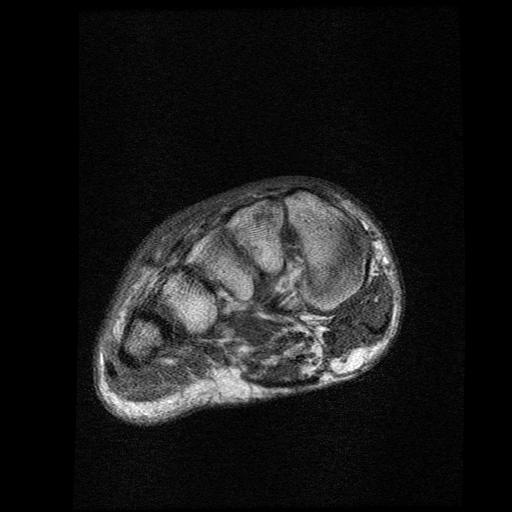
[im 52/52]
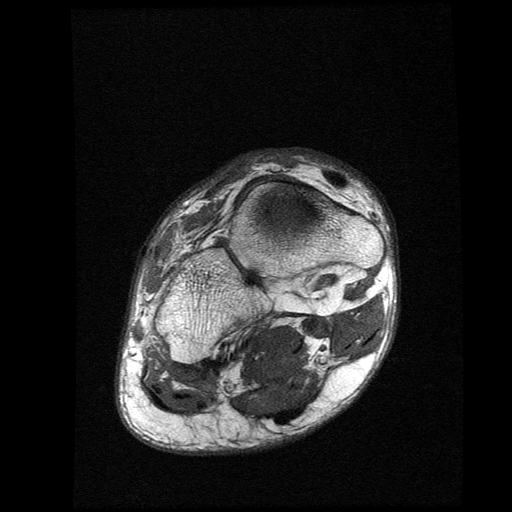

[Series 8: T1 fat-sat · coronal · non-contrast · 3.0mm · 0.29mm/px · 4 of 52 slices shown]
[im 1/52]
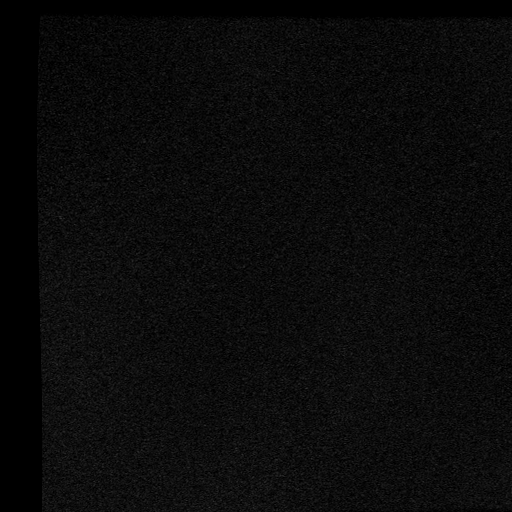
[im 11/52]
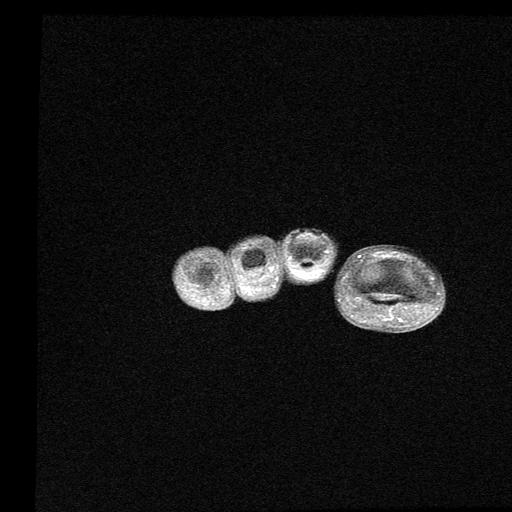
[im 31/52]
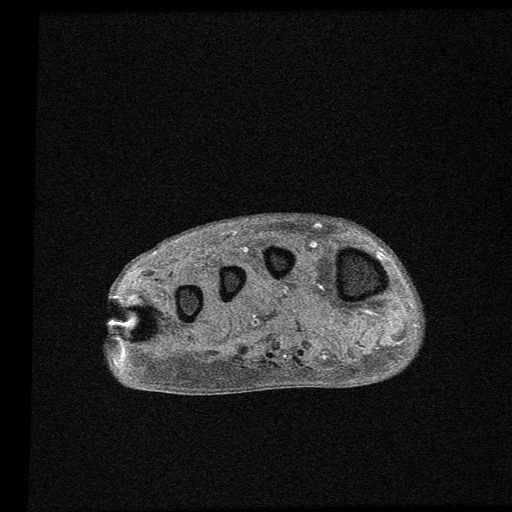
[im 52/52]
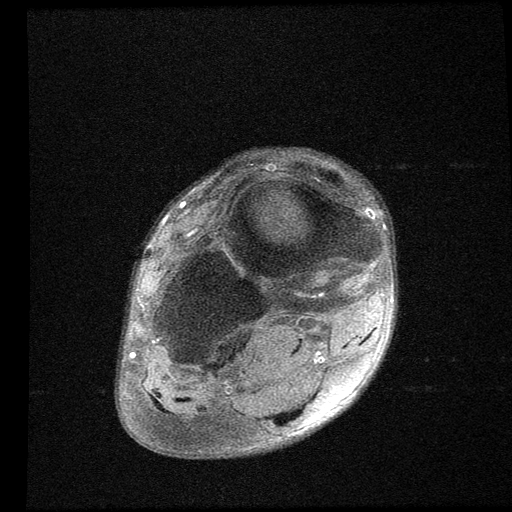

[Series 11: T2 fat-sat · axial · 3.0mm · 0.35mm/px · z∈[-118,-42]mm · 3 of 23 slices shown (2 of 2)]
[im 1/23]
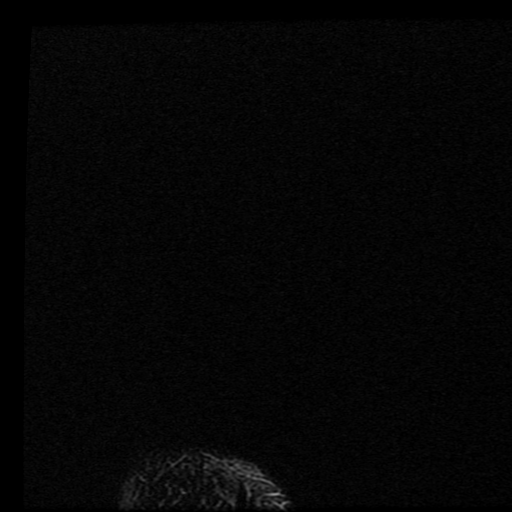
[im 12/23]
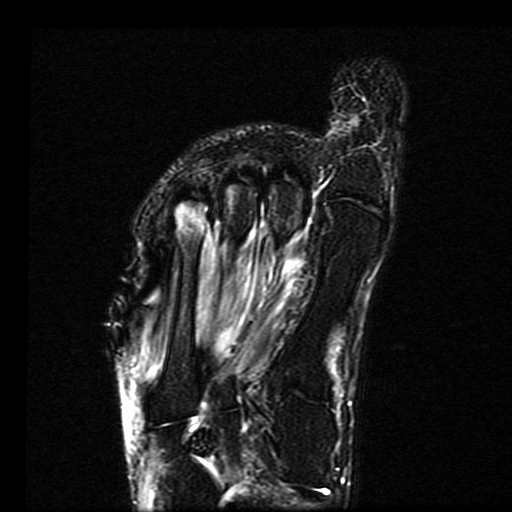
[im 23/23]
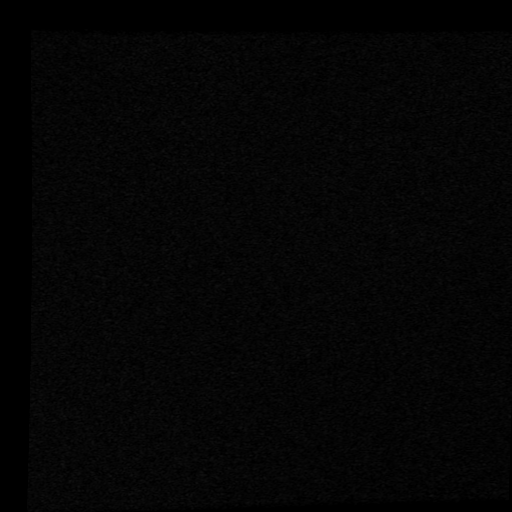

[18 of 40 positions shown; findings below may reference images not displayed]

FINDINGS: Bones/Joint/Cartilage

Since the previous MRI, and demonstrated on the subsequent
radiographs, the patient has undergone amputation through the mid
5th metatarsal shaft. There is adjacent lateral soft tissue
susceptibility artifact. The amputation margins appear sharp,
without progressive cortical destruction. There are no suspicious T1
marrow signal abnormalities. However, there is persistent T2 marrow
hyperintensity and enhancement within the shaft of the remaining 5th
metatarsal, similar to the preoperative study. There is also mild T2
hyperintensity and enhancement within the 4th metatarsal head and
4th proximal phalanx which appears new. The additional rays and
visualized tarsal bones appear normal. No significant joint
effusions.

Ligaments

Intact Lisfranc ligament.

Muscles and Tendons

The flexor and extensor tendons are intact without tenosynovitis.
The muscular T2 hyperintensity within the flexor musculature on the
prior study has improved. No focal muscular fluid collection.

Soft tissues

Postsurgical susceptibility artifact lateral to the 5th metatarsal.
There is mild soft tissue edema and enhancement in the dorsal and
lateral forefoot soft tissues which appears improved from the
previous study. No focal fluid collection.
IMPRESSION: 1. Interval amputation through the mid shaft of the 5th metatarsal.
There is residual nonspecific mild marrow T2 hyperintensity and
enhancement within the remaining 5th metatarsal shaft, similar to
the preoperative study. No progressive cortical destruction.
2. New mild nonspecific marrow T2 hyperintensity and enhancement in
4th metatarsal head and 4th proximal phalanx. No cortical
destruction.
3. The dorsal forefoot soft tissue inflammatory changes have
improved, and no focal fluid collections are identified.

## 2021-06-07 IMAGING — CT CT ANGIO CHEST
2 of 6 series · 19 of 36 positions shown · IV contrast (omnipaque)
Comparison: Chest radiograph [DATE]

CLINICAL DATA: Pulmonary embolus suspected with high probability.
Recent surgery, weakness, tachycardia, and hypotension.

EXAM:
CT ANGIOGRAPHY CHEST WITH CONTRAST
TECHNIQUE: Multidetector CT imaging of the chest was performed using the
standard protocol during bolus administration of intravenous
contrast. Multiplanar CT image reconstructions and MIPs were
obtained to evaluate the vascular anatomy.
CONTRAST:  75mL OMNIPAQUE IOHEXOL 350 MG/ML SOLN

[Series 7: pe thins · axial · 0.84mm/px · z∈[+1241,+1447]mm · 18 of 328 slices shown]
[im 17/328  lung]
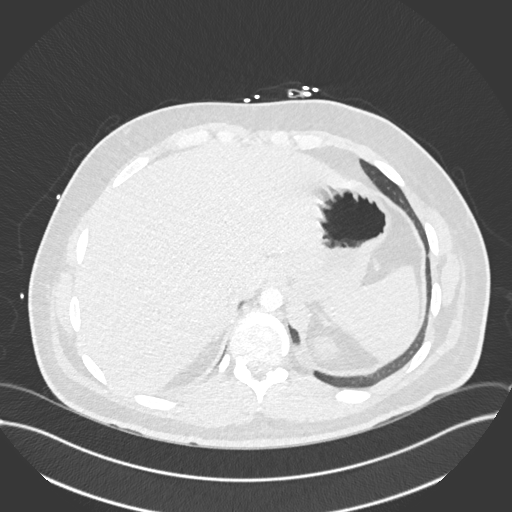
[im 33/328  mediastinal]
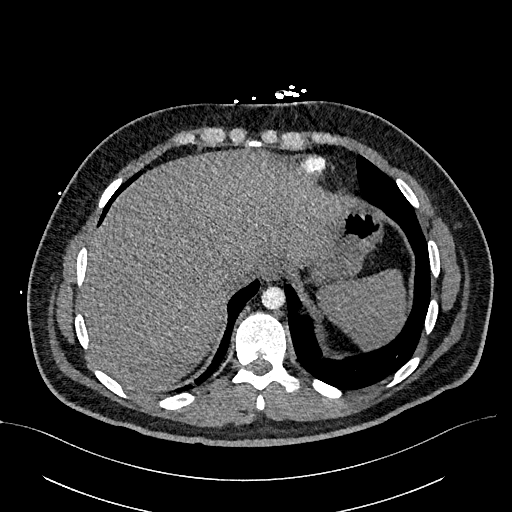
[im 50/328  lung]
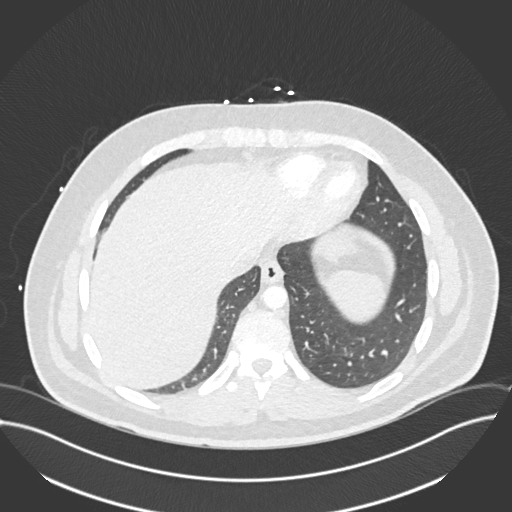
[im 66/328  mediastinal]
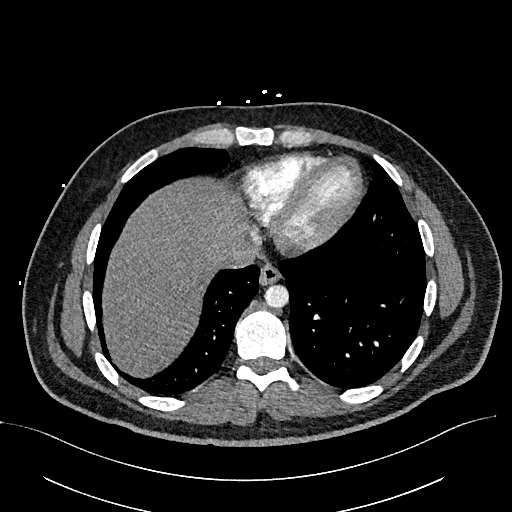
[im 82/328  lung]
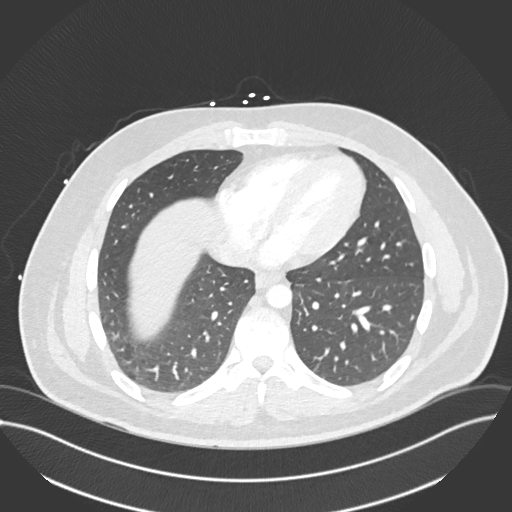
[im 99/328  mediastinal]
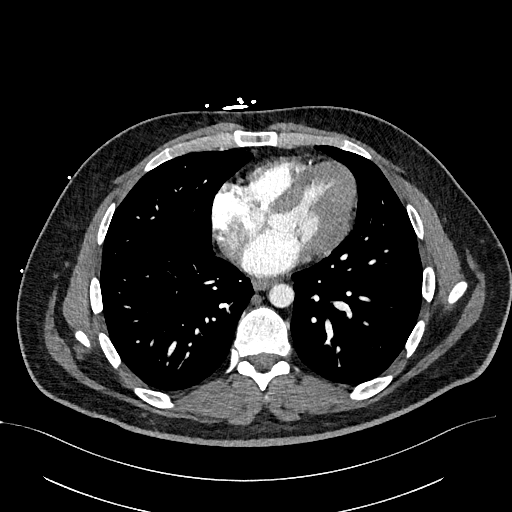
[im 115/328  lung]
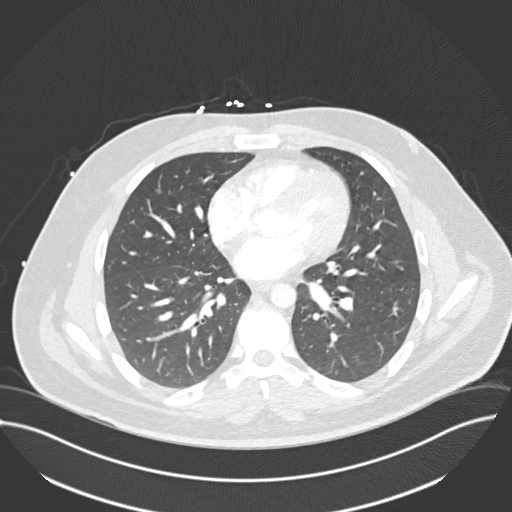
[im 131/328  mediastinal]
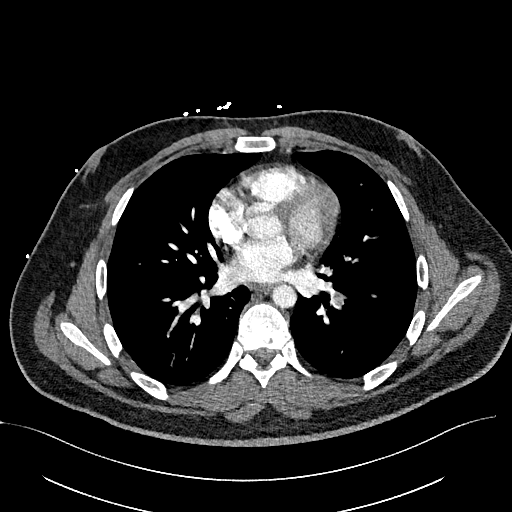
[im 148/328  lung]
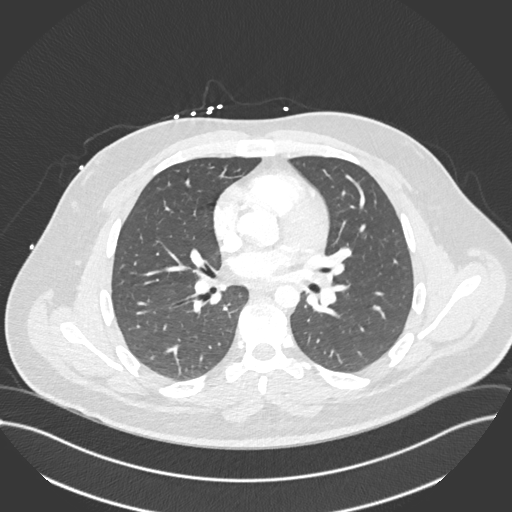
[im 180/328  mediastinal]
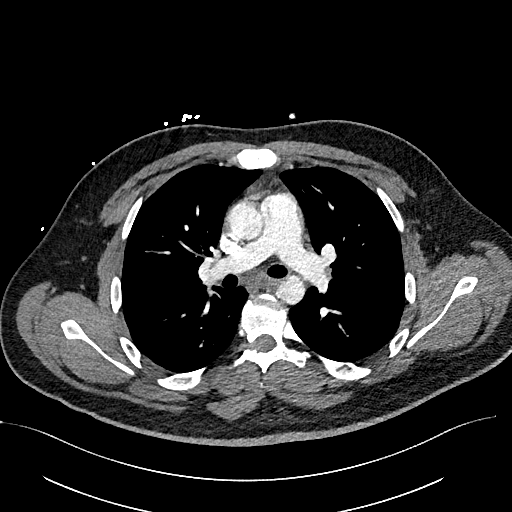
[im 197/328  lung]
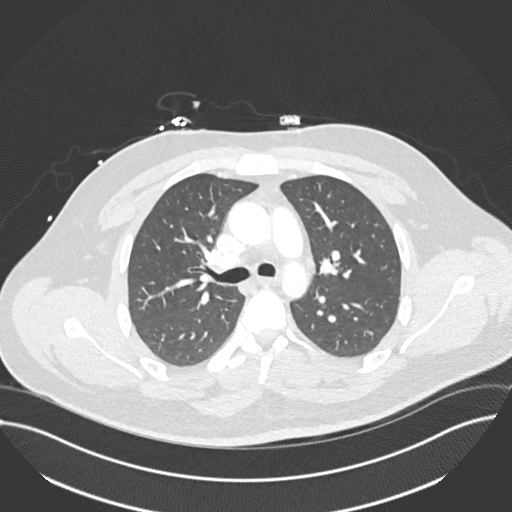
[im 213/328  mediastinal]
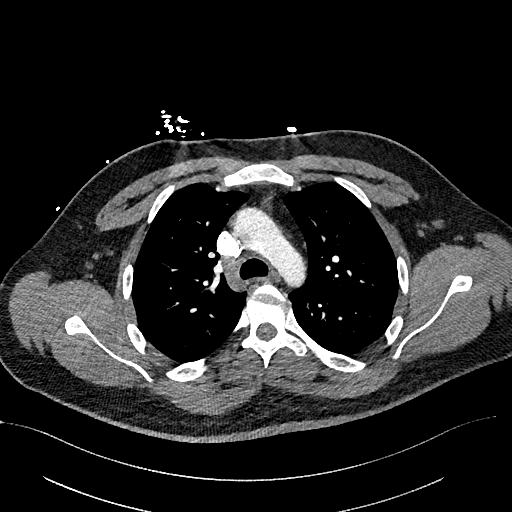
[im 229/328  lung]
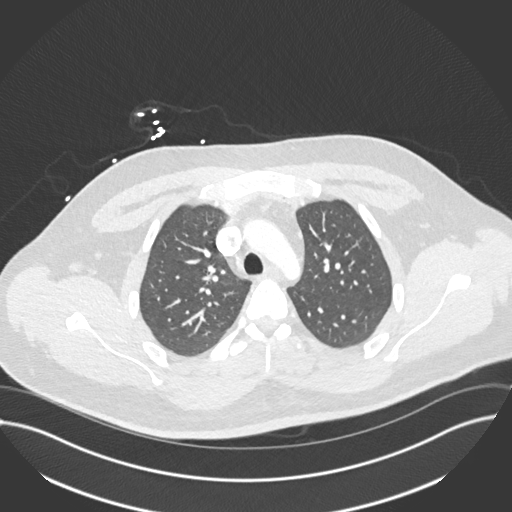
[im 246/328  mediastinal]
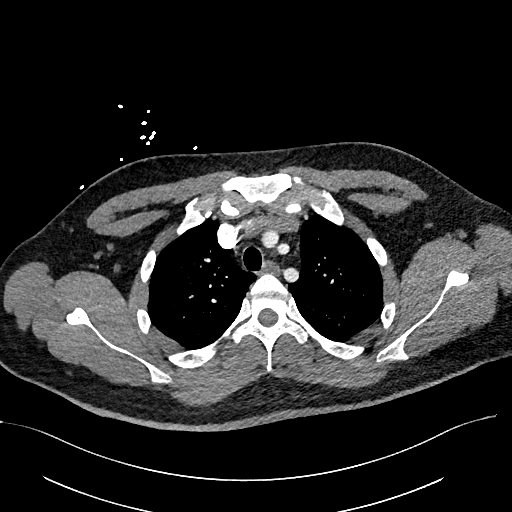
[im 262/328  lung]
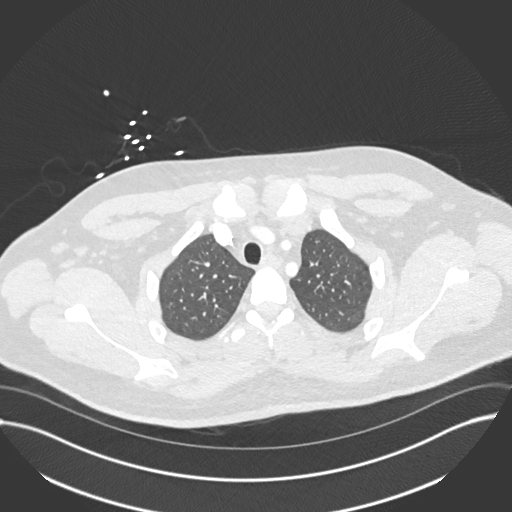
[im 278/328  mediastinal]
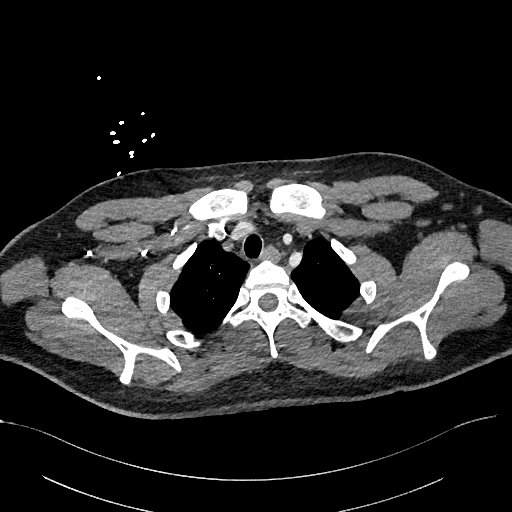
[im 295/328  lung]
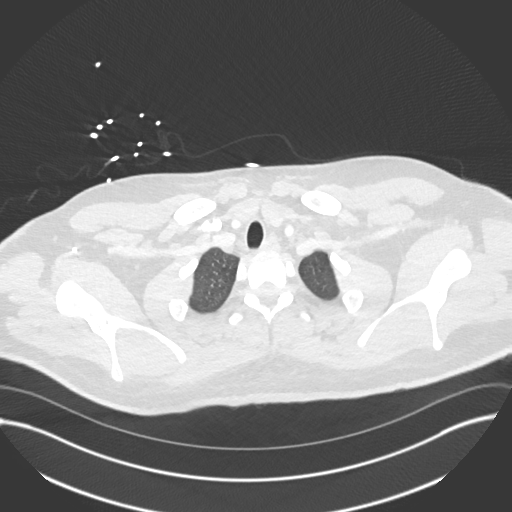
[im 311/328  mediastinal]
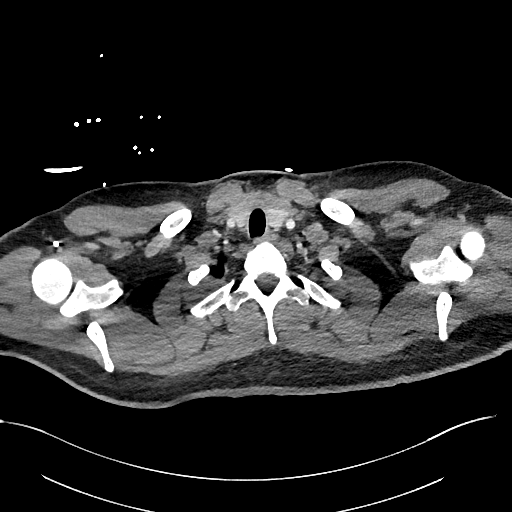

[Series 8: pe 2mm cor · coronal · 0.54mm/px · 1 of 151 slices shown]
[im 76/151  mediastinal]
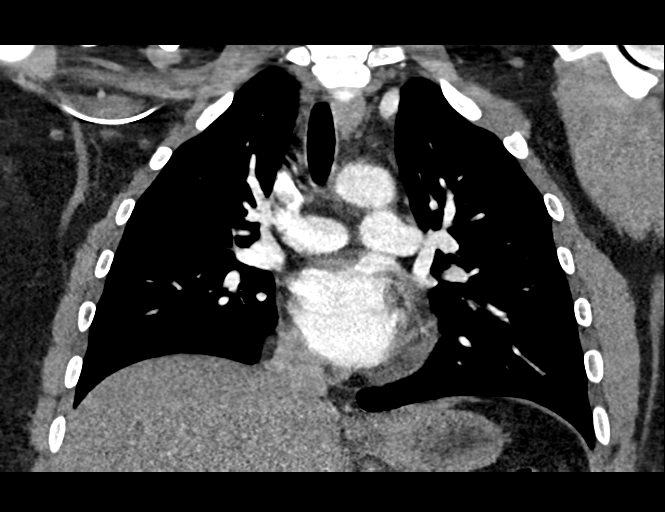

[19 of 36 positions shown; findings below may reference images not displayed]

FINDINGS: Cardiovascular: Good opacification of the central and segmental
pulmonary arteries. No focal filling defects. No evidence of
significant pulmonary embolus. Normal heart size. No pericardial
effusions. Normal caliber thoracic aorta. No aortic dissection.
Great vessel origins are patent.

Mediastinum/Nodes: No enlarged mediastinal, hilar, or axillary lymph
nodes. Thyroid gland, trachea, and esophagus demonstrate no
significant findings.

Lungs/Pleura: Lungs are clear. No pleural effusion or pneumothorax.

Upper Abdomen: No acute abnormalities demonstrated in the visualized
upper abdomen.

Musculoskeletal: No chest wall abnormality. No acute or significant
osseous findings.

Review of the MIP images confirms the above findings.
IMPRESSION: No evidence of significant pulmonary embolus.  Lungs are clear.

## 2021-06-07 MED ORDER — GLUCERNA SHAKE PO LIQD
237.0000 mL | Freq: Three times a day (TID) | ORAL | Status: DC
  Administered 2021-06-07 – 2021-06-08 (×3): 237 mL via ORAL
  Filled 2021-06-07: qty 237

## 2021-06-07 MED ORDER — PIPERACILLIN-TAZOBACTAM 3.375 G IVPB
3.3750 g | Freq: Three times a day (TID) | INTRAVENOUS | Status: DC
  Administered 2021-06-07: 3.375 g via INTRAVENOUS
  Filled 2021-06-07: qty 50

## 2021-06-07 MED ORDER — ACETAMINOPHEN 650 MG RE SUPP: 650.0000 mg | Freq: Four times a day (QID) | RECTAL | Status: DC | PRN

## 2021-06-07 MED ORDER — LACTATED RINGERS IV BOLUS
1000.0000 mL | Freq: Once | INTRAVENOUS | Status: AC
  Administered 2021-06-07: 1000 mL via INTRAVENOUS

## 2021-06-07 MED ORDER — ISOSORBIDE MONONITRATE ER 30 MG PO TB24: 30.0000 mg | ORAL_TABLET | Freq: Every day | ORAL | Status: DC

## 2021-06-07 MED ORDER — CARVEDILOL 12.5 MG PO TABS: 25.0000 mg | ORAL_TABLET | Freq: Two times a day (BID) | ORAL | Status: DC

## 2021-06-07 MED ORDER — INSULIN ASPART 100 UNIT/ML IJ SOLN
15.0000 [IU] | Freq: Three times a day (TID) | INTRAMUSCULAR | Status: DC
  Administered 2021-06-07 – 2021-06-08 (×3): 15 [IU] via SUBCUTANEOUS

## 2021-06-07 MED ORDER — VANCOMYCIN HCL 1750 MG/350ML IV SOLN: 1750.0000 mg | Freq: Three times a day (TID) | INTRAVENOUS | Status: DC

## 2021-06-07 MED ORDER — GADOBUTROL 1 MMOL/ML IV SOLN
9.7000 mL | Freq: Once | INTRAVENOUS | Status: AC | PRN
  Administered 2021-06-07: 9.7 mL via INTRAVENOUS

## 2021-06-07 MED ORDER — ENOXAPARIN SODIUM 40 MG/0.4ML IJ SOSY
40.0000 mg | PREFILLED_SYRINGE | INTRAMUSCULAR | Status: DC
  Administered 2021-06-07: 40 mg via SUBCUTANEOUS
  Filled 2021-06-07: qty 0.4

## 2021-06-07 MED ORDER — PIPERACILLIN-TAZOBACTAM 3.375 G IVPB 30 MIN
3.3750 g | Freq: Once | INTRAVENOUS | Status: AC
  Administered 2021-06-07: 3.375 g via INTRAVENOUS
  Filled 2021-06-07: qty 50

## 2021-06-07 MED ORDER — ATORVASTATIN CALCIUM 10 MG PO TABS
10.0000 mg | ORAL_TABLET | Freq: Every day | ORAL | Status: DC
  Administered 2021-06-07 – 2021-06-08 (×2): 10 mg via ORAL
  Filled 2021-06-07 (×2): qty 1

## 2021-06-07 MED ORDER — PANTOPRAZOLE SODIUM 40 MG PO TBEC
40.0000 mg | DELAYED_RELEASE_TABLET | Freq: Every day | ORAL | Status: DC
  Administered 2021-06-07 – 2021-06-08 (×2): 40 mg via ORAL
  Filled 2021-06-07 (×2): qty 1

## 2021-06-07 MED ORDER — NITROGLYCERIN 0.4 MG SL SUBL: 0.4000 mg | SUBLINGUAL_TABLET | SUBLINGUAL | Status: DC | PRN

## 2021-06-07 MED ORDER — ACETAMINOPHEN 500 MG PO TABS
1000.0000 mg | ORAL_TABLET | Freq: Once | ORAL | Status: AC
  Administered 2021-06-07: 1000 mg via ORAL
  Filled 2021-06-07: qty 2

## 2021-06-07 MED ORDER — CARVEDILOL 12.5 MG PO TABS
25.0000 mg | ORAL_TABLET | Freq: Once | ORAL | Status: AC
  Administered 2021-06-07: 25 mg via ORAL
  Filled 2021-06-07: qty 2

## 2021-06-07 MED ORDER — IOHEXOL 350 MG/ML SOLN
75.0000 mL | Freq: Once | INTRAVENOUS | Status: AC | PRN
  Administered 2021-06-07: 75 mL via INTRAVENOUS

## 2021-06-07 MED ORDER — VANCOMYCIN HCL 2000 MG/400ML IV SOLN
2000.0000 mg | Freq: Once | INTRAVENOUS | Status: DC
  Filled 2021-06-07: qty 400

## 2021-06-07 MED ORDER — SODIUM CHLORIDE 0.9 % IV SOLN
800.0000 mg | Freq: Every day | INTRAVENOUS | Status: DC
  Administered 2021-06-07: 800 mg via INTRAVENOUS
  Filled 2021-06-07 (×3): qty 16

## 2021-06-07 MED ORDER — LACTATED RINGERS IV SOLN: INTRAVENOUS | Status: AC

## 2021-06-07 MED ORDER — ADULT MULTIVITAMIN W/MINERALS CH
1.0000 | ORAL_TABLET | Freq: Every day | ORAL | Status: DC
  Administered 2021-06-07 – 2021-06-08 (×2): 1 via ORAL
  Filled 2021-06-07 (×2): qty 1

## 2021-06-07 MED ORDER — JUVEN PO PACK
1.0000 | PACK | Freq: Two times a day (BID) | ORAL | Status: DC
  Administered 2021-06-07: 1 via ORAL
  Filled 2021-06-07: qty 1

## 2021-06-07 MED ORDER — ACETAMINOPHEN 325 MG PO TABS
650.0000 mg | ORAL_TABLET | Freq: Four times a day (QID) | ORAL | Status: DC | PRN
  Administered 2021-06-08: 650 mg via ORAL
  Filled 2021-06-07: qty 2

## 2021-06-07 MED ORDER — INSULIN ASPART 100 UNIT/ML IJ SOLN
0.0000 [IU] | Freq: Three times a day (TID) | INTRAMUSCULAR | Status: DC
  Administered 2021-06-07: 2 [IU] via SUBCUTANEOUS
  Administered 2021-06-07: 5 [IU] via SUBCUTANEOUS
  Administered 2021-06-08 (×2): 2 [IU] via SUBCUTANEOUS

## 2021-06-07 MED ORDER — CARVEDILOL 25 MG PO TABS
25.0000 mg | ORAL_TABLET | Freq: Two times a day (BID) | ORAL | Status: DC
  Administered 2021-06-07 – 2021-06-08 (×2): 25 mg via ORAL
  Filled 2021-06-07 (×2): qty 1

## 2021-06-07 MED ORDER — ASPIRIN EC 81 MG PO TBEC
81.0000 mg | DELAYED_RELEASE_TABLET | Freq: Every day | ORAL | Status: DC
  Administered 2021-06-07 – 2021-06-08 (×2): 81 mg via ORAL
  Filled 2021-06-07 (×2): qty 1

## 2021-06-07 MED ORDER — METRONIDAZOLE 500 MG/100ML IV SOLN
500.0000 mg | Freq: Two times a day (BID) | INTRAVENOUS | Status: DC
  Administered 2021-06-07: 500 mg via INTRAVENOUS
  Filled 2021-06-07: qty 100

## 2021-06-07 MED ORDER — INSULIN GLARGINE 100 UNIT/ML ~~LOC~~ SOLN
25.0000 [IU] | Freq: Two times a day (BID) | SUBCUTANEOUS | Status: DC
  Administered 2021-06-07 – 2021-06-08 (×3): 25 [IU] via SUBCUTANEOUS
  Filled 2021-06-07 (×4): qty 0.25

## 2021-06-07 NOTE — ED Notes (Signed)
Transported to CT 

## 2021-06-07 NOTE — Progress Notes (Signed)
PROGRESS NOTE    Lucas CUPPLES  ZSM:[id number] DOB: 15-Oct-1996 DOA: 06/06/2021 PCP: [name], MD   Brief Narrative:  [name] is a 25 y.o. 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  Patient states his symptoms started after the home health aide came to check check and change dressing on the right upper extremity PICC line.  Patient started having chills and rigors and also some nausea.  Denies any chest pain productive cough abdominal pain diarrhea.  Patient denies any discharge from the right foot dressing. In the ER patient was febrile tachycardic with temperature 103 F.  Patient underwent CT angiogram of the chest was unremarkable.  Abdomen is benign on exam.  Labs show lactic acid of 2.5 and a WBC of 12 COVID test was negative.  UA unremarkable.  Assessment & Plan:   Principal Problem:   SIRS (systemic inflammatory response syndrome) (HCC) Active Problems:   Uncontrolled diabetes mellitus with diabetic autonomic neuropathy, with long-term current use of insulin (HCC)   HTN (hypertension)   Uncontrolled type 1 diabetes mellitus with hyperglycemia (HCC)   SIRS, rule out sepsis, POA -Patient carries previous diagnosis of osteomyelitis prior to admission from last hospitalization  -ID consulted, continues on daptomycin and Flagyl -Zosyn added at admission -No clear source for fevers other than known osteomyelitic right foot status post right foot fifth ray amputation -Patient does have PICC line placed at previous hospitalization, without erythema or infiltrate, may be worth pulling this line while inpatient to culture -Possible repeat imaging in the next 24 to  48 hours pending clinical course  Diabetes mellitus type 1.5 on insulin -Follow glucose levels, sliding scale insulin and hypoglycemic protocol   Hypertension, essential  -Continue to hold home medications while normotensive   History of peripheral artery disease  Continue aspirin and statins.   DVT prophylaxis: Lovenox. Code Status: Full code. Family Communication: None present  Status is: Inpt  Dispo: The patient is from: Home              Anticipated d/c is to: Home              Anticipated d/c date is: 48-72h              Patient currently NOT medically stable for discharge  Consultants:  ID  Procedures:  None  Antimicrobials:  Daptomycin, Zosyn, Flagyl, ongoing  Subjective: No acute issues or events overnight, patient reports low-grade fever and chills but otherwise denies nausea vomiting diarrhea constipation headache shortness of breath chest pain diarrhea constipation urinary frequency hesitancy urgency or burning.  Objective: Vitals:   06/07/21 0430 06/07/21 0500 06/07/21 0530 06/07/21 0556  BP:    (!) 99/57  Pulse: (!) 108 (!) 106 (!) 107 (!) 111  Resp: 18 17 19 18   Temp:    99.3 F (37.4 C)  TempSrc:    Oral  SpO2: 97% 98% 96% 98%  Weight:      Height:        Intake/Output Summary (Last 24 hours) at 06/07/2021 0712 Last data filed at 06/07/2021 0625 Gross per 24 hour  Intake 4325 ml  Output --  Net 4325 ml   [employer]  06/07/21 0200  Weight: 97.5 kg    Examination:  General:  Pleasantly resting in bed, No acute distress. HEENT:  Normocephalic atraumatic.  Sclerae nonicteric, noninjected.  Extraocular movements intact bilaterally. Neck:  Without mass or deformity.  Trachea is midline. Lungs:  Clear to auscultate bilaterally without rhonchi, wheeze, or rales. Heart:  Regular rate and rhythm.  Without murmurs, rubs, or gallops. Abdomen:  Soft, nontender, nondistended.  Without guarding or rebound. Extremities: Without cyanosis, clubbing,  edema, or obvious deformity.  Right foot boot intact.   Vascular:  Right upper extremity PICC line in bandage clean dry intact. Skin:  Warm and dry, no erythema, no ulcerations.  Right foot bandage clean dry intact    Data Reviewed: I have personally reviewed following labs and imaging studies  CBC: Recent Labs  Lab 06/06/21 2051 06/06/21 2153 06/07/21 0251  WBC  --  12.0* 13.9*  NEUTROABS  --  10.8*  --   HGB 14.3 13.8 13.6  HCT 42.0 41.8 41.1  MCV  --  83.9 84.7  PLT  --  260 035   Basic Metabolic Panel: Recent Labs  Lab 06/06/21 2051 06/06/21 2153  NA 134* 134*  K 3.9 3.8  CL  --  97*  CO2  --  24  GLUCOSE  --  275*  BUN  --  12  CREATININE  --  0.86  CALCIUM  --  9.6   GFR: Estimated Creatinine Clearance: 159.7 mL/min (by C-G formula based on SCr of 0.86 mg/dL). Liver Function Tests: Recent Labs  Lab 06/06/21 2153  AST 36  ALT 48*  ALKPHOS 71  BILITOT 1.1  PROT 7.9  ALBUMIN 3.6   No results for input(s): LIPASE, AMYLASE in the last 168 hours. No results for input(s): AMMONIA in the last 168 hours. Coagulation Profile: No results for input(s): INR, PROTIME in the last 168 hours. Cardiac Enzymes: No results for input(s): CKTOTAL, CKMB, CKMBINDEX, TROPONINI in the last 168 hours. BNP (last 3 results) No results for input(s): PROBNP in the last 8760 hours. HbA1C: No results for input(s): HGBA1C in the last 72 hours. CBG: No results for input(s): GLUCAP in the last 168 hours. Lipid Profile: No results for input(s): CHOL, HDL, LDLCALC, TRIG, CHOLHDL, LDLDIRECT in the last 72 hours. Thyroid Function Tests: Recent Labs    06/07/21 0251  TSH 0.277*   Anemia Panel: No results for input(s): VITAMINB12, FOLATE, FERRITIN, TIBC, IRON, RETICCTPCT in the last 72 hours. Sepsis Labs: Recent Labs  Lab 06/07/21 0133 06/07/21 0210 06/07/21 0328  PROCALCITON 8.80  --   --   LATICACIDVEN  --  2.5* 1.4    Recent Results (from the past 240 hour(s))  Resp  Panel by RT-PCR (Flu A&B, Covid) Nasopharyngeal Swab     Status: None   Collection Time: 06/06/21  8:32 PM   Specimen: Nasopharyngeal Swab; Nasopharyngeal(NP) swabs in vial transport medium  Result Value Ref Range Status   SARS Coronavirus 2 by RT PCR NEGATIVE NEGATIVE Final    Comment: (NOTE) SARS-CoV-2 target nucleic acids are NOT DETECTED.  The SARS-CoV-2 RNA is generally detectable in upper respiratory specimens during the acute phase of infection. The lowest concentration of SARS-CoV-2 viral copies this assay can detect is 138 copies/mL. A negative result does not preclude SARS-Cov-2 infection and should not be used as the sole basis for treatment or other patient management decisions. A negative result may occur with  improper specimen collection/handling, submission of specimen other than nasopharyngeal swab, presence of  viral mutation(s) within the areas targeted by this assay, and inadequate number of viral copies(<138 copies/mL). A negative result must be combined with clinical observations, patient history, and epidemiological information. The expected result is Negative.  Fact Sheet for Patients:  EntrepreneurPulse.com.au  Fact Sheet for Healthcare Providers:  IncredibleEmployment.be  This test is no t yet approved or cleared by the [place] FDA and  has been authorized for detection and/or diagnosis of SARS-CoV-2 by FDA under an Emergency Use Authorization (EUA). This EUA will remain  in effect (meaning this test can be used) for the duration of the COVID-19 declaration under Section 564(b)(1) of the Act, 21 U.S.C.section 360bbb-3(b)(1), unless the authorization is terminated  or revoked sooner.       Influenza A by PCR NEGATIVE NEGATIVE Final   Influenza B by PCR NEGATIVE NEGATIVE Final    Comment: (NOTE) The Xpert Xpress SARS-CoV-2/FLU/RSV plus assay is intended as an aid in the diagnosis of influenza from Nasopharyngeal  swab specimens and should not be used as a sole basis for treatment. Nasal washings and aspirates are unacceptable for Xpert Xpress SARS-CoV-2/FLU/RSV testing.  Fact Sheet for Patients: EntrepreneurPulse.com.au  Fact Sheet for Healthcare Providers: IncredibleEmployment.be  This test is not yet approved or cleared by the [place] FDA and has been authorized for detection and/or diagnosis of SARS-CoV-2 by FDA under an Emergency Use Authorization (EUA). This EUA will remain in effect (meaning this test can be used) for the duration of the COVID-19 declaration under Section 564(b)(1) of the Act, 21 U.S.C. section 360bbb-3(b)(1), unless the authorization is terminated or revoked.  Performed at [facility], [place] [address] [place], [place] [address]          Radiology Studies: CT Angio Chest PE W and/or Wo Contrast  Result Date: 06/07/2021 CLINICAL DATA:  Pulmonary embolus suspected with high probability. Recent surgery, weakness, tachycardia, and hypotension. EXAM: CT ANGIOGRAPHY CHEST WITH CONTRAST TECHNIQUE: Multidetector CT imaging of the chest was performed using the standard protocol during bolus administration of intravenous contrast. Multiplanar CT image reconstructions and MIPs were obtained to evaluate the vascular anatomy. CONTRAST:  23mL OMNIPAQUE IOHEXOL 350 MG/ML SOLN COMPARISON:  Chest radiograph 06/06/2021 FINDINGS: Cardiovascular: Good opacification of the central and segmental pulmonary arteries. No focal filling defects. No evidence of significant pulmonary embolus. Normal heart size. No pericardial effusions. Normal caliber thoracic aorta. No aortic dissection. Great vessel origins are patent. Mediastinum/Nodes: No enlarged mediastinal, hilar, or axillary lymph nodes. Thyroid gland, trachea, and esophagus demonstrate no significant findings. Lungs/Pleura: Lungs are clear. No pleural effusion or pneumothorax. Upper Abdomen: No  acute abnormalities demonstrated in the visualized upper abdomen. Musculoskeletal: No chest wall abnormality. No acute or significant osseous findings. Review of the MIP images confirms the above findings. IMPRESSION: No evidence of significant pulmonary embolus.  Lungs are clear. Electronically Signed   By: Lucienne Capers M.D.   On: 06/07/2021 00:43   DG Chest Port 1 View  Result Date: 06/06/2021 CLINICAL DATA:  Weakness, nausea, and vomiting after PICC line change. Tachycardia. EXAM: PORTABLE CHEST 1 VIEW COMPARISON:  11/19/2018 FINDINGS: Right PICC line with tip over the low SVC region. No pneumothorax. Heart size and pulmonary vascularity are normal. Lungs are clear. Mediastinal contours appear intact. No pleural effusions. IMPRESSION: Right PICC line appears in satisfactory position.  Lungs are clear. Electronically Signed   By: Lucienne Capers M.D.   On: 06/06/2021 23:35        Scheduled Meds:  aspirin EC  81  mg Oral Daily   atorvastatin  10 mg Oral Daily   enoxaparin (LOVENOX) injection  40 mg Subcutaneous Q24H   insulin aspart  0-9 Units Subcutaneous TID WC   insulin aspart  15 Units Subcutaneous TID AC   insulin glargine  25 Units Subcutaneous BID   pantoprazole  40 mg Oral Daily   Continuous Infusions:  DAPTOmycin (CUBICIN)  IV     lactated ringers 150 mL/hr at 06/07/21 0623   metronidazole 500 mg (06/07/21 0625)   piperacillin-tazobactam (ZOSYN)  IV       LOS: 0 days   Time spent: 2min  Rakisha Pincock C Lamonta Cypress, DO [facility]  If 7PM-7AM, please contact night-coverage [email or web address]  06/07/2021, 7:12 AM

## 2021-06-07 NOTE — ED Notes (Signed)
Breakfast Ordered 

## 2021-06-07 NOTE — Progress Notes (Signed)
Inpatient Diabetes Program Recommendations  AACE/ADA: New Consensus Statement on Inpatient Glycemic Control (2015)  Target Ranges:  Prepandial:   less than 140 mg/dL      Peak postprandial:   less than 180 mg/dL (1-2 hours)      Critically ill patients:  140 - 180 mg/dL   Lab Results  Component Value Date   GLUCAP 172 (H) 06/07/2021   HGBA1C 10.5 (H) 05/20/2021    Review of Glycemic Control Results for [name], [name] (MRN [id number]) as of 06/07/2021 14:05  Ref. Range 06/07/2021 08:57 06/07/2021 11:44  Glucose-Capillary Latest Ref Range: 70 - 99 mg/dL 271 (H) 172 (H)   Diabetes history: DM 2 Outpatient Diabetes medications:  Lantus 35 units bid, Humalog 15 units tid with meals, Ozempic 1 mg weekly Current orders for Inpatient glycemic control:  Novolog sensitive tid with meals Lantus 25 units bid  Novolog 15 units tid with meals  Inpatient Diabetes Program Recommendations:    Agree with orders.  Will follow.   Thanks,  Adah Perl, RN, BC-ADM Inpatient Diabetes Coordinator Pager [phone]  (8a-5p)

## 2021-06-07 NOTE — Progress Notes (Signed)
Initial Nutrition Assessment  DOCUMENTATION CODES:   Not applicable  INTERVENTION:   - Glucerna Shake po TID, each supplement provides 220 kcal and 10 grams of protein  - 1 packet Juven BID, each packet provides 95 calories, 2.5 grams of protein, and 9.8 grams of carbohydrate; also contains L-arginine and L-glutamine, vitamin C, vitamin E, vitamin B-12, zinc, calcium, and calcium Beta-hydroxy-Beta-methylbutyrate to support wound healing  - MVI with minerals daily  - Liberalize diet to Carb Modified only, verbal with readback placed per MD  - Encourage PO intake  NUTRITION DIAGNOSIS:   Increased nutrient needs related to wound healing, acute illness as evidenced by estimated needs.  GOAL:   Patient will meet greater than or equal to 90% of their needs  MONITOR:   PO intake, Supplement acceptance, Labs, Weight trends, Skin  REASON FOR ASSESSMENT:   Malnutrition Screening Tool    ASSESSMENT:   25 year old male who presented to the ED on 6/14 with weakness, N/V. PMH of DM type 1.5, HTN, PAD. Pt recently admitted for R foot fifth metatarsal osteomyelitis s/p amputation. Pt admitted with sepsis.  Spoke with pt at bedside. Pt in good spirits at time of RD visit. Pt reports appetite is slightly improved compared to when he was admitted about two weeks ago. Pt states that he has still been eating his one meal a day at dinner. Pt has been increasing his portion sizes and has been focusing on protein. During the day, pt has been drinking Glucerna Shakes (between 3-5 shakes daily) and occasionally will consume baked BBQ chips.  Pt states that he has ordered a burger with sides for lunch today and knows to focus on consuming the protein (burger patty) first. Discussed diet liberalization with MD who agreed with Carb Modified diet.  Pt reports that his UBW "with the brace on" is 220 lbs. He noticed that he weighed 215 lbs a few days after his most recent admission and attributes this to  being hospitalized and having a poor appetite during admission.  Reviewed weight history in chart. Pt with a 9.1 kg weight loss since 12/21/20. This is an 8.5% weight loss in 6 months which is not quite significant for timeframe but is concerning given progressive nature.  Pt states that his N/V has resolved. Pt willing to consume oral nutrition supplements. RD to order Glucerna Shakes and Juven supplements as well as daily MVI.  Medications reviewed and include: SSI q meals, novolog 1 units TID before meals, lantus 25 units BID, protonix, IV abx IVF: LR @ 150 ml/hr  Labs reviewed: sodium 133 CBG's: 271  NUTRITION - FOCUSED PHYSICAL EXAM:  Flowsheet Row Most Recent Value  Orbital Region No depletion  Upper Arm Region Mild depletion  Thoracic and Lumbar Region No depletion  Buccal Region No depletion  Temple Region Mild depletion  Clavicle Bone Region Mild depletion  Clavicle and Acromion Bone Region Mild depletion  Scapular Bone Region No depletion  Dorsal Hand Moderate depletion  Patellar Region No depletion  Anterior Thigh Region Mild depletion  Posterior Calf Region No depletion  Edema (RD Assessment) None  Hair Reviewed  Eyes Reviewed  Mouth Reviewed  [poor dentition]  Skin Reviewed  Nails Reviewed       Diet Order:   Diet Order             Diet Carb Modified Fluid consistency: Thin; Room service appropriate? Yes  Diet effective now  EDUCATION NEEDS:   No education needs have been identified at this time  Skin:  Skin Assessment: Skin Integrity Issues: Incisions: R foot Other: open sores to BLE  Last BM:  no documented BM  Height:   Ht Readings from Last 1 Encounters:  06/07/21 6\' 3"  (1.905 m)    Weight:   Wt Readings from Last 1 Encounters:  06/07/21 97.5 kg    BMI:  Body mass index is 26.87 kg/m.  Estimated Nutritional Needs:   Kcal:  2400-2600  Protein:  125-140 grams  Fluid:  >/= 2.0 L/day    Gustavus Bryant,  MS, RD, LDN Inpatient Clinical Dietitian Please see AMiON for contact information.

## 2021-06-07 NOTE — Progress Notes (Signed)
Pharmacy Antibiotic Note  [name] is a 25 y.o. male admitted on 06/06/2021 with sepsis.  Pharmacy has been consulted for vancomycin and zosyn dosing.  Plan: Zosyn 3.375g IV q8h (4 hour infusion). Vancomycin 2gm IV X 1 then 750 mg IV q8 hours F/u renal function, cultures and clinical course     Temp (24hrs), Avg:99.5 F (37.5 C), Min:99.5 F (37.5 C), Max:99.5 F (37.5 C)  Recent Labs  Lab 06/06/21 2153  WBC 12.0*  CREATININE 0.86    CrCl cannot be calculated (Unknown ideal weight.).    Allergies  Allergen Reactions   Amoxicillin Hives    Has patient had a PCN reaction causing immediate rash, facial/tongue/throat swelling, SOB or lightheadedness with hypotension: No Has patient had a PCN reaction causing severe rash involving mucus membranes or skin necrosis: No Has patient had a PCN reaction that required hospitalization: No Has patient had a PCN reaction occurring within the last 10 years: No If all of the above answers are "NO", then may proceed with Cephalosporin use.    Thank you for allowing pharmacy to be a part of this patient's care.  Excell Seltzer Poteet 06/07/2021 1:41 AM

## 2021-06-07 NOTE — H&P (Signed)
History and Physical    [name] ASN:[id number] DOB: [birth year] DOA: 06/06/2021  PCP: [name], MD  Patient coming from: Home.  Chief Complaint: Chills and rigors.  HPI: [name] is a 25 y.o. 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  Patient states his symptoms started after the home health aide came to check check and change dressing on the right upper extremity PICC line.  Patient started having chills and rigors and also some nausea.  Denies any chest pain productive cough abdominal pain diarrhea.  Patient denies any discharge from the right foot dressing.  ED Course: In the ER patient was febrile tachycardic with temperature 103 F.  Patient underwent CT angiogram of the chest was unremarkable.  Abdomen is benign on exam.  Labs show lactic acid of 2.5 and a WBC of 12 COVID test was negative.  UA unremarkable.  Patient was started on empiric antibiotics for sepsis source not clear.  Review of Systems: As per HPI, rest all negative.   Past Medical History:  Diagnosis Date   Complication of anesthesia    Hard to wake up when he was younger once.   Diabetes mellitus, type II ([place])    Heart murmur    Hypertension     Past Surgical History:  Procedure Laterality Date   AMPUTATION Right 05/21/2021   Procedure: AMPUTATION RAY 5th;  Surgeon: Trula Slade, DPM;  Location: [place];  Service: Podiatry;  Laterality: Right;   PALATE / UVULA BIOPSY / EXCISION     "growth removed"   UPPER GASTROINTESTINAL ENDOSCOPY     Done in [place] - 2013 ish   WOUND DEBRIDEMENT Right 05/24/2021   Procedure: RIGHT FOOT WOUND DEBRIDEMENT AND CLOSURE;  Surgeon: Evelina Bucy, DPM;  Location: [facility];  Service: Podiatry;  Laterality: Right;     reports that he is a non-smoker but has been exposed to tobacco smoke. He has never used smokeless tobacco. He reports that he does not drink alcohol and does not use drugs.  Allergies  Allergen Reactions   Amoxicillin Hives    Has patient had a PCN reaction causing immediate rash, facial/tongue/throat swelling, SOB or lightheadedness with hypotension: No Has patient had a PCN reaction causing severe rash involving mucus membranes or skin necrosis: No Has patient had a PCN reaction that required hospitalization: No Has patient had a PCN reaction occurring within the last 10 years: No If all of the above answers are "NO", then may proceed with Cephalosporin use.     Family History  Problem Relation Age of Onset   Diabetes Maternal Grandmother    Thyroid disease Maternal Grandmother    Hypertension Maternal Grandmother    Healthy Mother    Healthy Father    Healthy Sister    Healthy Sister     Prior to Admission medications   Medication Sig Start Date End Date Taking? Authorizing Provider  aspirin EC 81 MG tablet Take 1 tablet (81 mg total) by mouth daily. 09/13/20   Verta Ellen., NP  atorvastatin (LIPITOR) 10 MG tablet Take 1 tablet (10 mg total) by mouth daily. 05/26/21 08/24/21  Mendel Corning, MD  blood glucose  meter kit and supplies KIT Dispense based on patient and insurance preference. Use up to four times daily as directed. (FOR ICD-10 E11.65). 01/13/19   Cassandria Anger, MD  carvedilol (COREG) 25 MG tablet Take 1 tablet (25 mg total) by mouth 2 (two) times daily. 12/01/18   Herminio Commons, MD  daptomycin (CUBICIN) IVPB Inject 800 mg into the vein daily. Indication: Ostetmyelitis/septic arthritis First Dose: Yes Last Day of Therapy:  07/05/21 Labs - Once weekly:  CBC/D, BMP, and CPK Labs - Every other week:  ESR and CRP Method of administration: IV Push Method of administration may  be changed at the discretion of home infusion pharmacist based upon assessment of the patient and/or caregiver's ability to self-administer the medication ordered. 05/26/21 07/05/21  Rai, Vernelle Emerald, MD  hydrOXYzine (ATARAX/VISTARIL) 50 MG tablet Take 50 mg by mouth 3 (three) times daily as needed for anxiety.    [provider]  ibuprofen (ADVIL) 800 MG tablet TAKE 1 TABLET BY MOUTH EVERY 8 HOURS AS NEEDED Patient taking differently: Take 800 mg by mouth every 8 (eight) hours as needed for mild pain. 05/10/21   Carole Civil, MD  insulin glargine (LANTUS SOLOSTAR) 100 UNIT/ML Solostar Pen Inject 35 Units into the skin 2 (two) times daily. 10/18/20   Philemon Kingdom, MD  insulin lispro (HUMALOG KWIKPEN) 200 UNIT/ML KwikPen Inject under skin 10-14 units 3x a day before meals Patient taking differently: Inject 15 Units into the skin 3 (three) times daily before meals. 10/18/20   Philemon Kingdom, MD  Insulin Pen Needle (CARETOUCH PEN NEEDLES) 31G X 6 MM MISC Use 6x a day 10/18/20   Philemon Kingdom, MD  isosorbide mononitrate (IMDUR) 30 MG 24 hr tablet Take 1 tablet (30 mg total) by mouth daily. 05/26/21 08/24/21  Rai, Vernelle Emerald, MD  Lancet Devices [facility]) lancets Use as instructed 4 x daily. e11.65 04/03/17   Cassandria Anger, MD  Lancets (ACCU-CHEK SOFT TOUCH) lancets Use as instructed 08/10/18   Isla Pence, MD  metroNIDAZOLE (FLAGYL) 500 MG tablet Take 1 tablet (500 mg total) by mouth 2 (two) times daily for 14 days. 05/26/21 06/09/21  Rai, Vernelle Emerald, MD  nitroGLYCERIN (NITROSTAT) 0.4 MG SL tablet Place 1 tablet (0.4 mg total) under the tongue every 5 (five) minutes as needed for chest pain. 09/13/20 12/12/20  Verta Ellen., NP  omeprazole (PRILOSEC) 20 MG capsule Take 20 mg by mouth daily.  10/16/18   [provider]  OZEMPIC, 0.25 OR 0.5 MG/DOSE, 2 MG/1.5ML SOPN INJECT 0.5 MG INTO THE SKIN ONCE A WEEK 03/22/21   Philemon Kingdom, MD  zolpidem (AMBIEN) 10  MG tablet Take 10 mg by mouth at bedtime. 08/17/20   [provider]    Physical Exam: Constitutional: Moderately built and nourished. Vitals:   06/07/21 0030 06/07/21 0032 06/07/21 0145 06/07/21 0150  BP: 124/61 124/61 129/78   Pulse: (!) 125 (!) 123 (!) 129   Resp: 13  (!) 21   Temp:   (!) 102.2 F (39 C) (!) 103.2 F (39.6 C)  TempSrc:   Oral Oral  SpO2: 100%  98%    Eyes: Anicteric no pallor. ENMT: No discharge from the ears eyes nose and mouth. Neck: No mass felt.  No neck rigidity. Respiratory: No rhonchi or crepitations. Cardiovascular: S1-S2 heard. Abdomen: Soft nontender bowel sound present. Musculoskeletal: Right foot has staples with no discharge. Skin: Right foot no discharge. Neurologic: Alert awake oriented to time place and person.  Moves all extremities. Psychiatric: Appears normal.  Normal affect.   Labs on Admission: I have personally reviewed following labs and imaging studies  CBC: Recent Labs  Lab 06/06/21 2051 06/06/21 2153  WBC  --  12.0*  NEUTROABS  --  10.8*  HGB 14.3 13.8  HCT 42.0 41.8  MCV  --  83.9  PLT  --  762   Basic Metabolic Panel: Recent Labs  Lab 06/06/21 2051 06/06/21 2153  NA 134* 134*  K 3.9 3.8  CL  --  97*  CO2  --  24  GLUCOSE  --  275*  BUN  --  12  CREATININE  --  0.86  CALCIUM  --  9.6   GFR: CrCl cannot be calculated (Unknown ideal weight.). Liver Function Tests: Recent Labs  Lab 06/06/21 2153  AST 36  ALT 48*  ALKPHOS 71  BILITOT 1.1  PROT 7.9  ALBUMIN 3.6   No results for input(s): LIPASE, AMYLASE in the last 168 hours. No results for input(s): AMMONIA in the last 168 hours. Coagulation Profile: No results for input(s): INR, PROTIME in the last 168 hours. Cardiac Enzymes: No results for input(s): CKTOTAL, CKMB, CKMBINDEX, TROPONINI in the last 168 hours. BNP (last 3 results) No results for input(s): PROBNP in the last 8760 hours. HbA1C: No results for input(s): HGBA1C in the last 72  hours. CBG: No results for input(s): GLUCAP in the last 168 hours. Lipid Profile: No results for input(s): CHOL, HDL, LDLCALC, TRIG, CHOLHDL, LDLDIRECT in the last 72 hours. Thyroid Function Tests: No results for input(s): TSH, T4TOTAL, FREET4, T3FREE, THYROIDAB in the last 72 hours. Anemia Panel: No results for input(s): VITAMINB12, FOLATE, FERRITIN, TIBC, IRON, RETICCTPCT in the last 72 hours. Urine analysis:    Component Value Date/Time   COLORURINE YELLOW 06/06/2021 2204   APPEARANCEUR CLEAR 06/06/2021 2204   LABSPEC 1.024 06/06/2021 2204   PHURINE 6.0 06/06/2021 2204   GLUCOSEU 150 (A) 06/06/2021 2204   HGBUR NEGATIVE 06/06/2021 2204   BILIRUBINUR NEGATIVE 06/06/2021 2204   KETONESUR 20 (A) 06/06/2021 2204   PROTEINUR 30 (A) 06/06/2021 2204   NITRITE NEGATIVE 06/06/2021 2204   LEUKOCYTESUR NEGATIVE 06/06/2021 2204   Sepsis Labs: _0 (procalcitonin:4,lacticidven:4) ) Recent Results (from the past 240 hour(s))  Resp Panel by RT-PCR (Flu A&B, Covid) Nasopharyngeal Swab     Status: None   Collection Time: 06/06/21  8:32 PM   Specimen: Nasopharyngeal Swab; Nasopharyngeal(NP) swabs in vial transport medium  Result Value Ref Range Status   SARS Coronavirus 2 by RT PCR NEGATIVE NEGATIVE Final    Comment: (NOTE) SARS-CoV-2 target nucleic acids are NOT DETECTED.  The SARS-CoV-2 RNA is generally detectable in upper respiratory specimens during the acute phase of infection. The lowest concentration of SARS-CoV-2 viral copies this assay can detect is 138 copies/mL. A negative result does not preclude SARS-Cov-2 infection and should not be used as the sole basis for treatment or other patient management decisions. A negative result may occur with  improper specimen collection/handling, submission of specimen other than nasopharyngeal swab, presence of viral mutation(s) within the areas targeted by this assay, and inadequate number of viral copies(<138 copies/mL). A negative  result must be combined with clinical observations, patient history, and epidemiological information. The expected result is Negative.  Fact Sheet for Patients:  EntrepreneurPulse.com.au  Fact Sheet for Healthcare Providers:  IncredibleEmployment.be  This test is no t yet approved or cleared by the [place] FDA and  has been authorized for detection and/or  diagnosis of SARS-CoV-2 by FDA under an Emergency Use Authorization (EUA). This EUA will remain  in effect (meaning this test can be used) for the duration of the COVID-19 declaration under Section 564(b)(1) of the Act, 21 U.S.C.section 360bbb-3(b)(1), unless the authorization is terminated  or revoked sooner.       Influenza A by PCR NEGATIVE NEGATIVE Final   Influenza B by PCR NEGATIVE NEGATIVE Final    Comment: (NOTE) The Xpert Xpress SARS-CoV-2/FLU/RSV plus assay is intended as an aid in the diagnosis of influenza from Nasopharyngeal swab specimens and should not be used as a sole basis for treatment. Nasal washings and aspirates are unacceptable for Xpert Xpress SARS-CoV-2/FLU/RSV testing.  Fact Sheet for Patients: EntrepreneurPulse.com.au  Fact Sheet for Healthcare Providers: IncredibleEmployment.be  This test is not yet approved or cleared by the [place] FDA and has been authorized for detection and/or diagnosis of SARS-CoV-2 by FDA under an Emergency Use Authorization (EUA). This EUA will remain in effect (meaning this test can be used) for the duration of the COVID-19 declaration under Section 564(b)(1) of the Act, 21 U.S.C. section 360bbb-3(b)(1), unless the authorization is terminated or revoked.  Performed at [facility], [place] [address] [place], [place] [address]      Radiological Exams on Admission: CT Angio Chest PE W and/or Wo Contrast  Result Date: 06/07/2021 CLINICAL DATA:  Pulmonary embolus suspected with  high probability. Recent surgery, weakness, tachycardia, and hypotension. EXAM: CT ANGIOGRAPHY CHEST WITH CONTRAST TECHNIQUE: Multidetector CT imaging of the chest was performed using the standard protocol during bolus administration of intravenous contrast. Multiplanar CT image reconstructions and MIPs were obtained to evaluate the vascular anatomy. CONTRAST:  50m OMNIPAQUE IOHEXOL 350 MG/ML SOLN COMPARISON:  Chest radiograph 06/06/2021 FINDINGS: Cardiovascular: Good opacification of the central and segmental pulmonary arteries. No focal filling defects. No evidence of significant pulmonary embolus. Normal heart size. No pericardial effusions. Normal caliber thoracic aorta. No aortic dissection. Great vessel origins are patent. Mediastinum/Nodes: No enlarged mediastinal, hilar, or axillary lymph nodes. Thyroid gland, trachea, and esophagus demonstrate no significant findings. Lungs/Pleura: Lungs are clear. No pleural effusion or pneumothorax. Upper Abdomen: No acute abnormalities demonstrated in the visualized upper abdomen. Musculoskeletal: No chest wall abnormality. No acute or significant osseous findings. Review of the MIP images confirms the above findings. IMPRESSION: No evidence of significant pulmonary embolus.  Lungs are clear. Electronically Signed   By: WLucienne CapersM.D.   On: 06/07/2021 00:43   DG Chest Port 1 View  Result Date: 06/06/2021 CLINICAL DATA:  Weakness, nausea, and vomiting after PICC line change. Tachycardia. EXAM: PORTABLE CHEST 1 VIEW COMPARISON:  11/19/2018 FINDINGS: Right PICC line with tip over the low SVC region. No pneumothorax. Heart size and pulmonary vascularity are normal. Lungs are clear. Mediastinal contours appear intact. No pleural effusions. IMPRESSION: Right PICC line appears in satisfactory position.  Lungs are clear. Electronically Signed   By: WLucienne CapersM.D.   On: 06/06/2021 23:35    EKG: Independently reviewed.  Sinus  tachycardia.  Assessment/Plan Principal Problem:   SIRS (systemic inflammatory response syndrome) (HCC) Active Problems:   Uncontrolled diabetes mellitus with diabetic autonomic neuropathy, with long-term current use of insulin ([facility])   HTN (hypertension)   Uncontrolled type 1 diabetes mellitus [place] hyperglycemia [place]    Possible developing sepsis source not clear.  Patient is already on daptomycin we have added to Zosyn and continue Flagyl.  Follow blood cultures.  If blood cultures grow positive for any organism  then the PICC line may need to be changed.  Patient does have some nausea but no vomiting ALT is mildly elevated if fever persist will get a CT scan of the abdomen and also probably MRI of the right foot.  Follow lactic acid level.  Continue aggressive hydration.  The right foot wound has staples no discharge. Diabetes mellitus type 1.5 on Lantus insulin does have decreased slightly because patient is nauseated.  Follow CBGs closely and metabolic panel. Hypertension holding antihypertensives patient's blood pressure is in the low normal's.  May need vasopressors if blood pressure does not improve. History of peripheral artery disease on aspirin and statins. Recent right foot fifth ray amputation for osteomyelitis.  Since patient has septic picture on presentation will need inpatient status.   DVT prophylaxis: Lovenox. Code Status: Full code. Family Communication: Discussed with patient. Disposition Plan: To be determined. Consults called: None. Admission status: Inpatient.   Rise Patience MD [facility] Pager [phone].  If 7PM-7AM, please contact night-coverage [email or web address] Password [email or web address]  06/07/2021, 2:15 AM

## 2021-06-07 NOTE — Consult Note (Signed)
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  He finished 2 weeks of Flagyl and currently on 6 weeks of the mycin.  End date 07/05/2021.  Presents to the ED for fever 103, tachycardia, hypotension, concerned for sepsis.  Lactic acid normalized quickly.  Patient appears clinically stable on exam with improvement of blood pressure.  Unclear source of infection at this time.  I am concerned about developing osteomyelitis of the right fourth metatarsal.  Last x-ray and MRI of the right foot mentioned possible early osteomyelitis of the fourth toe.  Will obtain right foot MRI for further evaluation.  If he does have osteomyelitis there, will need surgery consult.  Low suspicion for respiratory, urinary or GI source.  Low suspicion for PICC line infection at this time.  Will remove the line if blood culture grows any organism.  Will continue daptomycin and discontinue Zosyn with Flagyl.  Will continue to follow blood culture.  Patient reports allergy to amoxicillin however he was doing fine on Zosyn.  Will de-label his penicillin allergy.  Plan: Continue daptomycin Discontinue Zosyn and Flagyl Obtain MRI of right foot Follow-up blood culture  Principal Problem:   SIRS (systemic inflammatory response syndrome) (HCC) Active Problems:   Uncontrolled diabetes mellitus with diabetic autonomic neuropathy, with long-term current use of insulin (HCC)   HTN (hypertension)   Uncontrolled type 1 diabetes mellitus with hyperglycemia (HCC)   Scheduled Meds:  aspirin EC  81 mg Oral Daily   atorvastatin  10 mg Oral Daily    carvedilol  25 mg Oral BID WC   enoxaparin (LOVENOX) injection  40 mg Subcutaneous Q24H   feeding supplement (GLUCERNA SHAKE)  237 mL Oral TID BM   insulin aspart  0-9 Units Subcutaneous TID WC   insulin aspart  15 Units Subcutaneous TID AC   insulin glargine  25 Units Subcutaneous BID   multivitamin with minerals  1 tablet Oral Daily   nutrition supplement (JUVEN)  1 packet Oral BID BM   pantoprazole  40 mg Oral Daily   Continuous Infusions:  DAPTOmycin (CUBICIN)  IV     lactated ringers 150 mL/hr at 06/07/21 0623   PRN Meds:.acetaminophen **OR** acetaminophen, nitroGLYCERIN  HPI: [name] is a 25 y.o. male with PMH of uncontrolled type 1 DM, HTN, PAD, who presented to the hospital for an episode of fever and chills.  Patient was recently hospitalized in May for right fifth metatarsal osteomyelitis status post right fifth toe amputation.  Wound culture grew MRSA.  Patient was started on 6 weeks of daptomycin and 2 weeks of Flagyl.  End date 07/05/2021.  He has a PICC line in the right brachial.  In the ED, he had a fever of 103 with tachycardia and hypotension.  Sepsis was suspected.  Initial lactic acid 2.5 normalized to 1.4.  He was started on daptomycin, Zosyn and Flagyl.  Blood cultures were repeated.  Patient is seen at bedside.  He appears comfortable in no acute distress.  States that he was in his usual state of health until  2 days ago when he started to feel chills and febrile.  Patient denies cough, sore throat, shortness of breath, chest pain, palpitation, dysuria, constipation.  Endorses mild diarrhea.  States that he takes care of his antibiotic at home.  He has at home health RN who comes by to change his bandage.  Patient reports keeping good care of the surgical wound.  Denies showering or waiting the area.  Also denies IV drug use.  Patient reports a long history of tachycardia which he is taking carvedilol.   Review of Systems: ROS  Per HPI  Past Medical  History:  Diagnosis Date   Complication of anesthesia    Hard to wake up when he was younger once.   Diabetes mellitus, type II ([place])    Heart murmur    Hypertension     Social History   Tobacco Use   Smoking status: Passive Smoke Exposure - Never Smoker   Smokeless tobacco: Never  Vaping Use   Vaping Use: Never used  Substance Use Topics   Alcohol use: No   Drug use: No    Family History  Problem Relation Age of Onset   Diabetes Maternal Grandmother    Thyroid disease Maternal Grandmother    Hypertension Maternal Grandmother    Healthy Mother    Healthy Father    Healthy Sister    Healthy Sister    Allergies  Allergen Reactions   Amoxicillin Hives    Has patient had a PCN reaction causing immediate rash, facial/tongue/throat swelling, SOB or lightheadedness with hypotension: No Has patient had a PCN reaction causing severe rash involving mucus membranes or skin necrosis: No Has patient had a PCN reaction that required hospitalization: No Has patient had a PCN reaction occurring within the last 10 years: No If all of the above answers are "NO", then may proceed with Cephalosporin use.     OBJECTIVE: Blood pressure 114/67, pulse (!) 110, temperature 99.2 F (37.3 C), temperature source Oral, resp. rate 16, height 6\' 3"  (1.905 m), weight 97.5 kg, SpO2 97 %.  Physical Exam Constitutional:      General: He is not in acute distress.    Appearance: He is not ill-appearing.  HENT:     Head: Normocephalic.     Mouth/Throat:     Comments: Missing front teeth Eyes:     General: No scleral icterus.       Right eye: No discharge.        Left eye: No discharge.     Conjunctiva/sclera: Conjunctivae normal.  Cardiovascular:     Rate and Rhythm: Tachycardia present.  Pulmonary:     Effort: Pulmonary effort is normal. No respiratory distress.  Abdominal:     General: There is no distension.     Tenderness: There is no abdominal tenderness.  Musculoskeletal:      Cervical back: Normal range of motion.     Comments: See picture for surgical wound. Severe diminished sensation of right foot.  Surgical sutures in place.  No pain to palpation of the toes.  Dry, flaky skin at the area  Neurological:     Mental Status: He is alert. Mental status is at baseline.  Psychiatric:        Mood and Affect: Mood normal.        Thought Content: Thought content normal.        Judgment: Judgment normal.     Lab Results Lab Results  Component Value Date   WBC 13.9 (H)  06/07/2021   HGB 13.6 06/07/2021   HCT 41.1 06/07/2021   MCV 84.7 06/07/2021   PLT 226 06/07/2021    Lab Results  Component Value Date   CREATININE 0.85 06/07/2021   BUN 9 06/07/2021   NA 133 (L) 06/07/2021   K 3.9 06/07/2021   CL 97 (L) 06/07/2021   CO2 23 06/07/2021    Lab Results  Component Value Date   ALT 39 06/07/2021   AST 28 06/07/2021   ALKPHOS 51 06/07/2021   BILITOT 1.2 06/07/2021     Microbiology: Recent Results (from the past 240 hour(s))  Resp Panel by RT-PCR (Flu A&B, Covid) Nasopharyngeal Swab     Status: None   Collection Time: 06/06/21  8:32 PM   Specimen: Nasopharyngeal Swab; Nasopharyngeal(NP) swabs in vial transport medium  Result Value Ref Range Status   SARS Coronavirus 2 by RT PCR NEGATIVE NEGATIVE Final    Comment: (NOTE) SARS-CoV-2 target nucleic acids are NOT DETECTED.  The SARS-CoV-2 RNA is generally detectable in upper respiratory specimens during the acute phase of infection. The lowest concentration of SARS-CoV-2 viral copies this assay can detect is 138 copies/mL. A negative result does not preclude SARS-Cov-2 infection and should not be used as the sole basis for treatment or other patient management decisions. A negative result may occur with  improper specimen collection/handling, submission of specimen other than nasopharyngeal swab, presence of viral mutation(s) within the areas targeted by this assay, and inadequate number of  viral copies(<138 copies/mL). A negative result must be combined with clinical observations, patient history, and epidemiological information. The expected result is Negative.  Fact Sheet for Patients:  EntrepreneurPulse.com.au  Fact Sheet for Healthcare Providers:  IncredibleEmployment.be  This test is no t yet approved or cleared by the [place] FDA and  has been authorized for detection and/or diagnosis of SARS-CoV-2 by FDA under an Emergency Use Authorization (EUA). This EUA will remain  in effect (meaning this test can be used) for the duration of the COVID-19 declaration under Section 564(b)(1) of the Act, 21 U.S.C.section 360bbb-3(b)(1), unless the authorization is terminated  or revoked sooner.       Influenza A by PCR NEGATIVE NEGATIVE Final   Influenza B by PCR NEGATIVE NEGATIVE Final    Comment: (NOTE) The Xpert Xpress SARS-CoV-2/FLU/RSV plus assay is intended as an aid in the diagnosis of influenza from Nasopharyngeal swab specimens and should not be used as a sole basis for treatment. Nasal washings and aspirates are unacceptable for Xpert Xpress SARS-CoV-2/FLU/RSV testing.  Fact Sheet for Patients: EntrepreneurPulse.com.au  Fact Sheet for Healthcare Providers: IncredibleEmployment.be  This test is not yet approved or cleared by the [place] FDA and has been authorized for detection and/or diagnosis of SARS-CoV-2 by FDA under an Emergency Use Authorization (EUA). This EUA will remain in effect (meaning this test can be used) for the duration of the COVID-19 declaration under Section 564(b)(1) of the Act, 21 U.S.C. section 360bbb-3(b)(1), unless the authorization is terminated or revoked.  Performed at [facility], [place] [address] [place], [place] [address]     Gaylan Gerold, [facility] [phone] pager   [phone] cell 06/07/2021, 2:53 PM

## 2021-06-08 ENCOUNTER — Ambulatory Visit (HOSPITAL_COMMUNITY): Payer: BC Managed Care – PPO | Admitting: Physical Therapy

## 2021-06-08 LAB — CBC
HCT: 36.9 % — ABNORMAL LOW (ref 39.0–52.0)
Hemoglobin: 12.4 g/dL — ABNORMAL LOW (ref 13.0–17.0)
MCH: 28.1 pg (ref 26.0–34.0)
MCHC: 33.6 g/dL (ref 30.0–36.0)
MCV: 83.7 fL (ref 80.0–100.0)
Platelets: 212 10*3/uL (ref 150–400)
RBC: 4.41 MIL/uL (ref 4.22–5.81)
RDW: 12.9 % (ref 11.5–15.5)
WBC: 6.8 10*3/uL (ref 4.0–10.5)
nRBC: 0 % (ref 0.0–0.2)

## 2021-06-08 LAB — COMPREHENSIVE METABOLIC PANEL
ALT: 43 U/L (ref 0–44)
AST: 42 U/L — ABNORMAL HIGH (ref 15–41)
Albumin: 3.3 g/dL — ABNORMAL LOW (ref 3.5–5.0)
Alkaline Phosphatase: 66 U/L (ref 38–126)
Anion gap: 10 (ref 5–15)
BUN: 5 mg/dL — ABNORMAL LOW (ref 6–20)
CO2: 27 mmol/L (ref 22–32)
Calcium: 9.3 mg/dL (ref 8.9–10.3)
Chloride: 98 mmol/L (ref 98–111)
Creatinine, Ser: 0.81 mg/dL (ref 0.61–1.24)
GFR, Estimated: >60 mL/min (ref >60)
Glucose, Bld: 156 mg/dL — ABNORMAL HIGH (ref 70–99)
Potassium: 3.5 mmol/L (ref 3.5–5.1)
Sodium: 135 mmol/L (ref 135–145)
Total Bilirubin: 0.8 mg/dL (ref 0.3–1.2)
Total Protein: 7.1 g/dL (ref 6.5–8.1)

## 2021-06-08 LAB — GLUCOSE, CAPILLARY
Glucose-Capillary: 163 mg/dL — ABNORMAL HIGH (ref 70–99)
Glucose-Capillary: 151 mg/dL — ABNORMAL HIGH (ref 70–99)

## 2021-06-08 MED ORDER — CHLORHEXIDINE GLUCONATE CLOTH 2 % EX PADS
6.0000 | MEDICATED_PAD | Freq: Every day | CUTANEOUS | Status: DC
  Administered 2021-06-08: 6 via TOPICAL

## 2021-06-08 MED ORDER — ADULT MULTIVITAMIN W/MINERALS CH: 1.0000 | ORAL_TABLET | Freq: Every day | ORAL | 0 refills | Status: DC

## 2021-06-08 MED ORDER — DAPTOMYCIN IV (FOR PTA / DISCHARGE USE ONLY): 800.0000 mg | INTRAVENOUS | 0 refills | Status: AC

## 2021-06-08 NOTE — Progress Notes (Signed)
PHARMACY CONSULT NOTE FOR:  OUTPATIENT  PARENTERAL ANTIBIOTIC THERAPY (OPAT)  Indication: Osteomyelitis/septic arthritis  Regimen: Daptomycin 800 mg every 24 hours  End date: 07/05/21  Resuming Daptomycin to complete therapy on 07/05/21  IV antibiotic discharge orders are pended. To discharging provider:  please sign these orders via discharge navigator,  Select New Orders & click on the button choice - Manage This Unsigned Work.     Thank you for allowing pharmacy to be a part of this patient's care.  Jimmy Footman, PharmD, BCPS, BCIDP Infectious Diseases Clinical Pharmacist Phone: [phone] 06/08/2021, 10:38 AM

## 2021-06-08 NOTE — Progress Notes (Signed)
[facility] for Infectious Disease   Reason for visit: Follow up on fever  Interval History: he has remained afebrile since his initial fever of 103.2, no positive blood cultures.  Feels well.  WBC wnl.  No new complaints.  MRI of foot with no significant findings.     Physical Exam: Constitutional:  Vitals:   06/08/21 0800 06/08/21 1100  BP: 123/79 110/74  Pulse: 99   Resp: 16   Temp:  97.9 F (36.6 C)  SpO2: 98%    patient appears in NAD Respiratory: Normal respiratory effort; CTA B Cardiovascular: RRR GI: soft, nt, nd MS: right foot with 4th toe with no drainage, nothing open, no eythema or warmth  Review of Systems: Constitutional: negative for fevers and chills Gastrointestinal: negative for nausea and diarrhea Integument/breast: negative for rash  Lab Results  Component Value Date   WBC 6.8 06/08/2021   HGB 12.4 (L) 06/08/2021   HCT 36.9 (L) 06/08/2021   MCV 83.7 06/08/2021   PLT 212 06/08/2021    Lab Results  Component Value Date   CREATININE 0.81 06/08/2021   BUN 5 (L) 06/08/2021   NA 135 06/08/2021   K 3.5 06/08/2021   CL 98 06/08/2021   CO2 27 06/08/2021    Lab Results  Component Value Date   ALT 43 06/08/2021   AST 42 (H) 06/08/2021   ALKPHOS 66 06/08/2021     Microbiology: Recent Results (from the past 240 hour(s))  Resp Panel by RT-PCR (Flu A&B, Covid) Nasopharyngeal Swab     Status: None   Collection Time: 06/06/21  8:32 PM   Specimen: Nasopharyngeal Swab; Nasopharyngeal(NP) swabs in vial transport medium  Result Value Ref Range Status   SARS Coronavirus 2 by RT PCR NEGATIVE NEGATIVE Final    Comment: (NOTE) SARS-CoV-2 target nucleic acids are NOT DETECTED.  The SARS-CoV-2 RNA is generally detectable in upper respiratory specimens during the acute phase of infection. The lowest concentration of SARS-CoV-2 viral copies this assay can detect is 138 copies/mL. A negative result does not preclude SARS-Cov-2 infection and should not  be used as the sole basis for treatment or other patient management decisions. A negative result may occur with  improper specimen collection/handling, submission of specimen other than nasopharyngeal swab, presence of viral mutation(s) within the areas targeted by this assay, and inadequate number of viral copies(<138 copies/mL). A negative result must be combined with clinical observations, patient history, and epidemiological information. The expected result is Negative.  Fact Sheet for Patients:  EntrepreneurPulse.com.au  Fact Sheet for Healthcare Providers:  IncredibleEmployment.be  This test is no t yet approved or cleared by the [place] FDA and  has been authorized for detection and/or diagnosis of SARS-CoV-2 by FDA under an Emergency Use Authorization (EUA). This EUA will remain  in effect (meaning this test can be used) for the duration of the COVID-19 declaration under Section 564(b)(1) of the Act, 21 U.S.C.section 360bbb-3(b)(1), unless the authorization is terminated  or revoked sooner.       Influenza A by PCR NEGATIVE NEGATIVE Final   Influenza B by PCR NEGATIVE NEGATIVE Final    Comment: (NOTE) The Xpert Xpress SARS-CoV-2/FLU/RSV plus assay is intended as an aid in the diagnosis of influenza from Nasopharyngeal swab specimens and should not be used as a sole basis for treatment. Nasal washings and aspirates are unacceptable for Xpert Xpress SARS-CoV-2/FLU/RSV testing.  Fact Sheet for Patients: EntrepreneurPulse.com.au  Fact Sheet for Healthcare Providers: IncredibleEmployment.be  This test is  not yet approved or cleared by the [place] and has been authorized for detection and/or diagnosis of SARS-CoV-2 by FDA under an Emergency Use Authorization (EUA). This EUA will remain in effect (meaning this test can be used) for the duration of the COVID-19 declaration under Section  564(b)(1) of the Act, 21 U.S.C. section 360bbb-3(b)(1), unless the authorization is terminated or revoked.  Performed at [facility], [place] [address] [place], [place] [address]   Blood culture (routine x 2)     Status: None (Preliminary result)   Collection Time: 06/07/21  2:00 AM   Specimen: BLOOD LEFT HAND  Result Value Ref Range Status   Specimen Description BLOOD LEFT HAND  Final   Special Requests   Final    BOTTLES DRAWN AEROBIC AND ANAEROBIC Blood Culture results may not be optimal due to an inadequate volume of blood received in culture bottles   Culture   Final    NO GROWTH 1 DAY Performed at [facility], [place] [address] [place], [place] [address]    Report Status PENDING  Incomplete  Blood culture (routine x 2)     Status: None (Preliminary result)   Collection Time: 06/07/21  2:10 AM   Specimen: BLOOD LEFT FOREARM  Result Value Ref Range Status   Specimen Description BLOOD LEFT FOREARM  Final   Special Requests   Final    BOTTLES DRAWN AEROBIC AND ANAEROBIC Blood Culture results may not be optimal due to an inadequate volume of blood received in culture bottles   Culture   Final    NO GROWTH 1 DAY Performed at [facility], [place] [address] [place], [place] [address]    Report Status PENDING  Incomplete    Impression/Plan:  1. Fever - no positive blood cultures to suggest a line infection as a source.  He feels well, no clinical concerns off of piperacillin/tazobactam.    2.  Osteomyelitis of right foot - on daptomyicn with previous MRSA in cultures and will continue with plan through 07/05/21.  He has follow up with Dr. [place] Bali arranged.   3.  Penicillin allergy - tolerating piperacillin so not c/w an allergy.  He will be delisted.

## 2021-06-08 NOTE — ED Provider Notes (Signed)
Signout note  25 year old gentleman with recent admission for osteomyelitis, has PICC line in place. On dapto.  Presented for general weakness. Noted to be tachy. No fever.   Received signout from [facility], follow-up on CT to rule out PE and reassess patient  Reassessed patient, PE study was negative.  Patient having some chills actively.  Personally checked patient's temperature and it was noted to be 102.2 F orally.  Given fever in setting of recent osteomyelitis and PICC line in place, will order repeat blood cultures, broaden antibiotics and consult [facility] to admit patient for further management.   Lucrezia Starch, MD 06/08/21 [phone]

## 2021-06-08 NOTE — Progress Notes (Signed)
Rn changed PICC line dressing per Pt request. Pt stated last time dressing was changed was 6/13 but would feel better being discharged with dressing changed.

## 2021-06-08 NOTE — TOC Transition Note (Signed)
Transition of Care (TOC) - CM/SW Discharge Note Marvetta Gibbons RN,BSN Transitions of Care Unit 4NP (non trauma) - RN Case Manager See Treatment Team for direct Phone #    Patient Details  Name: [name] MRN: [id number] Date of Birth: 12-Jan-1996  [facility]) CM/SW Contact:  Dawayne Patricia, RN Phone Number: 06/08/2021, 12:03 PM   Clinical Narrative:    Per progression rounds pt stable for transition home with resumption of [facility] services for Home IV abx needs. Spoke with Pam at [facility]- confirmed pt was active with them PTA for home IV abx needs, and that he was receiving [place] with [facility] for PICC line care and lab draws. Per Pam she has what she needs for OPAT as there are no changes. Pam will set things back up with [facility] for a nursing visit next week.  Bedside RN will change PICC drsg here prior to discharge.      Final next level of care: Home w Home Health Services Barriers to Discharge: No Barriers Identified   Patient Goals and CMS Choice Patient states their goals for this hospitalization and ongoing recovery are:: return home      Discharge Placement                 Home with [facility]- IV abx      Discharge Plan and Services   Discharge Planning Services: CM Consult Post Acute Care Choice: Resumption of Svcs/PTA Provider, Home Health          DME Arranged: N/A         HH Arranged: RN, IV Antibiotics HH Agency: [facility] Date HH Agency Contacted: 06/08/21 Time HH Agency Contacted: 1100 Representative spoke with at [place]: [place] Determinants of Health ([place]) Interventions     Readmission Risk Interventions Readmission Risk Prevention Plan 06/08/2021  Post Dischage Appt Complete  Medication Screening Complete  Transportation Screening Complete  Some recent data might be hidden

## 2021-06-08 NOTE — Progress Notes (Signed)
D/C education given to Pt and all questions answered. Printed prescriptions given to Pt, no equipment to deliver. IVs removed. Pt taken to car with all belongings.

## 2021-06-08 NOTE — Discharge Summary (Signed)
Physician Discharge Summary  TAOS TAPP MVH:[id number] DOB: 01/04/1996 DOA: 06/06/2021  PCP: [name], MD  Admit date: 06/06/2021 Discharge date: 06/08/2021  Admitted From: Home Disposition: Home  Recommendations for Outpatient Follow-up:  Follow up with PCP in 1-2 weeks Please obtain BMP/CBC in one week Please follow up with infectious disease at the end of the month as scheduled  Home Health: Continue home health Equipment/Devices: No new equipment  Discharge Condition: Stable CODE STATUS: Full Diet recommendation: Diabetic diet  Brief/Interim Summary: [name] is a 25 y.o. 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  Patient states his symptoms started after the home health aide came to check check and change dressing on the right upper extremity PICC line.  Patient started having chills and rigors and also some nausea.  Denies any chest pain productive cough abdominal pain diarrhea.  Patient denies any discharge from the right foot dressing. In the ER patient was febrile tachycardic with temperature 103 F.  Patient underwent CT angiogram of the chest was unremarkable.  Patient admitted given concern for underlying infection.  Patient was febrile overnight at intake however no recurrent fevers since that time, infectious disease was consulted, repeat image of the right foot given previous surgery and osteomyelitis was unremarkable, patient's labs and clinical status remained without overt cause of fever or infection.  We will continue patient on previous schedule of daptomycin and discharged home with close follow-up with home health care and wound care, outpatient  follow-up with infectious disease at the end of the month as previously scheduled.  No new infectious process was noted, unclear etiology for patient's transient fever but appears to be recovering well.  Discharge Diagnoses:  Principal Problem:   SIRS (systemic inflammatory response syndrome) (HCC) Active Problems:   Uncontrolled diabetes mellitus with diabetic autonomic neuropathy, with long-term current use of insulin (HCC)   HTN (hypertension)   Uncontrolled type 1 diabetes mellitus with hyperglycemia [facility])    Discharge Instructions  Discharge Instructions     Call MD for:  redness, tenderness, or signs of infection (pain, swelling, redness, odor or green/yellow discharge around incision site)   Complete by: As directed    Call MD for:  severe uncontrolled pain   Complete by: As directed    Call MD for:  temperature >100.4   Complete by: As directed    Diet Carb Modified   Complete by: As directed    Discharge wound care:   Complete by: As directed    Per wound care   Increase activity slowly   Complete by: As directed       Allergies as of 06/08/2021   No Active Allergies      Medication List     STOP taking these medications    metroNIDAZOLE 500 MG tablet Commonly known as: FLAGYL       TAKE these medications    accu-chek soft touch lancets Use as instructed   accu-chek softclix lancets Use as instructed 4 x daily. e11.65   acetaminophen 500 MG tablet Commonly known as: TYLENOL Take 1,000 mg by mouth every 6 (six) hours as needed for moderate pain or headache.   aspirin EC 81 MG tablet Take 1 tablet (  81 mg total) by mouth daily.   atorvastatin 10 MG tablet Commonly known as: LIPITOR Take 1 tablet (10 mg total) by mouth daily.   blood glucose meter kit and supplies Kit Dispense based on patient and insurance preference. Use up to four times daily as directed. (FOR ICD-10 E11.65).   CareTouch Pen Needles 31G X 6 MM Misc Generic drug: Insulin  Pen Needle Use 6x a day   carvedilol 25 MG tablet Commonly known as: COREG Take 1 tablet (25 mg total) by mouth 2 (two) times daily.   CLEAR EYES OP Place 1 drop into the left eye daily as needed (dry eyes).   daptomycin  IVPB Commonly known as: CUBICIN Inject 800 mg into the vein daily. Indication: Ostetmyelitis/septic arthritis First Dose: Yes Last Day of Therapy:  07/05/21 Labs - Once weekly:  CBC/D, BMP, and CPK Labs - Every other week:  ESR and CRP Method of administration: IV Push Method of administration may be changed at the discretion of home infusion pharmacist based upon assessment of the patient and/or caregiver's ability to self-administer the medication ordered.   HumaLOG KwikPen 200 UNIT/ML KwikPen Generic drug: insulin lispro Inject under skin 10-14 units 3x a day before meals What changed:  how much to take how to take this when to take this additional instructions   hydrOXYzine 50 MG tablet Commonly known as: ATARAX/VISTARIL Take 50 mg by mouth in the morning and at bedtime.   ibuprofen 800 MG tablet Commonly known as: ADVIL TAKE 1 TABLET BY MOUTH EVERY 8 HOURS AS NEEDED What changed: reasons to take this   isosorbide mononitrate 30 MG 24 hr tablet Commonly known as: IMDUR Take 1 tablet (30 mg total) by mouth daily.   Lantus SoloStar 100 UNIT/ML Solostar Pen Generic drug: insulin glargine Inject 35 Units into the skin 2 (two) times daily.   multivitamin with minerals Tabs tablet Take 1 tablet by mouth daily. Start taking on: June 09, 2021   nitroGLYCERIN 0.4 MG SL tablet Commonly known as: Nitrostat Place 1 tablet (0.4 mg total) under the tongue every 5 (five) minutes as needed for chest pain.   omeprazole 20 MG capsule Commonly known as: PRILOSEC Take 20 mg by mouth daily.   OVER THE COUNTER MEDICATION Place 4-5 drops into the left ear daily as needed (ringing in ear). Ear ringing medication   Ozempic (1 MG/DOSE) 4 MG/3ML Sopn Generic  drug: Semaglutide (1 MG/DOSE) Inject 1 mg into the skin once a week. What changed: Another medication with the same name was removed. Continue taking this medication, and follow the directions you see here.   zolpidem 10 MG tablet Commonly known as: AMBIEN Take 10 mg by mouth at bedtime.               Discharge Care Instructions  (From admission, onward)           Start     Ordered   06/08/21 0000  Discharge wound care:       Comments: Per wound care   06/08/21 1252            Follow-up Information     [facility] Follow up.   Why: resume services with [facility] for home IV abx needs coordinating nursing care with [facility] for PICC line care and lab draws               No Active Allergies  Consultations: Infectious disease  Procedures/Studies: CT Angio Chest PE W  and/or Wo Contrast  Result Date: 06/07/2021 CLINICAL DATA:  Pulmonary embolus suspected with high probability. Recent surgery, weakness, tachycardia, and hypotension. EXAM: CT ANGIOGRAPHY CHEST WITH CONTRAST TECHNIQUE: Multidetector CT imaging of the chest was performed using the standard protocol during bolus administration of intravenous contrast. Multiplanar CT image reconstructions and MIPs were obtained to evaluate the vascular anatomy. CONTRAST:  34m OMNIPAQUE IOHEXOL 350 MG/ML SOLN COMPARISON:  Chest radiograph 06/06/2021 FINDINGS: Cardiovascular: Good opacification of the central and segmental pulmonary arteries. No focal filling defects. No evidence of significant pulmonary embolus. Normal heart size. No pericardial effusions. Normal caliber thoracic aorta. No aortic dissection. Great vessel origins are patent. Mediastinum/Nodes: No enlarged mediastinal, hilar, or axillary lymph nodes. Thyroid gland, trachea, and esophagus demonstrate no significant findings. Lungs/Pleura: Lungs are clear. No pleural effusion or pneumothorax. Upper Abdomen: No acute abnormalities  demonstrated in the visualized upper abdomen. Musculoskeletal: No chest wall abnormality. No acute or significant osseous findings. Review of the MIP images confirms the above findings. IMPRESSION: No evidence of significant pulmonary embolus.  Lungs are clear. Electronically Signed   By: WLucienne CapersM.D.   On: 06/07/2021 00:43   MR FOOT RIGHT W WO CONTRAST  Result Date: 06/07/2021 CLINICAL DATA:  Partial amputation of the right 5th metatarsal 05/21/2021 with wound debridement 05/24/2021. On antibiotics. Concern for osteomyelitis. EXAM: MRI OF THE RIGHT FOREFOOT WITHOUT AND WITH CONTRAST TECHNIQUE: Multiplanar, multisequence MR imaging of the right forefoot was performed before and after the administration of intravenous contrast. CONTRAST:  9.738mGADAVIST GADOBUTROL 1 MMOL/ML IV SOLN COMPARISON:  Radiographs and MRI 05/21/2021. FINDINGS: Bones/Joint/Cartilage Since the previous MRI, and demonstrated on the subsequent radiographs, the patient has undergone amputation through the mid 5th metatarsal shaft. There is adjacent lateral soft tissue susceptibility artifact. The amputation margins appear sharp, without progressive cortical destruction. There are no suspicious T1 marrow signal abnormalities. However, there is persistent T2 marrow hyperintensity and enhancement within the shaft of the remaining 5th metatarsal, similar to the preoperative study. There is also mild T2 hyperintensity and enhancement within the 4th metatarsal head and 4th proximal phalanx which appears new. The additional rays and visualized tarsal bones appear normal. No significant joint effusions. Ligaments Intact Lisfranc ligament. Muscles and Tendons The flexor and extensor tendons are intact without tenosynovitis. The muscular T2 hyperintensity within the flexor musculature on the prior study has improved. No focal muscular fluid collection. Soft tissues Postsurgical susceptibility artifact lateral to the 5th metatarsal. There is  mild soft tissue edema and enhancement in the dorsal and lateral forefoot soft tissues which appears improved from the previous study. No focal fluid collection. IMPRESSION: 1. Interval amputation through the mid shaft of the 5th metatarsal. There is residual nonspecific mild marrow T2 hyperintensity and enhancement within the remaining 5th metatarsal shaft, similar to the preoperative study. No progressive cortical destruction. 2. New mild nonspecific marrow T2 hyperintensity and enhancement in 4th metatarsal head and 4th proximal phalanx. No cortical destruction. 3. The dorsal forefoot soft tissue inflammatory changes have improved, and no focal fluid collections are identified. Electronically Signed   By: WiRichardean Sale.D.   On: 06/07/2021 19:52   MR FOOT RIGHT W WO CONTRAST  Result Date: 05/21/2021 CLINICAL DATA:  Worsening infection and drainage of the right foot. EXAM: MRI OF THE RIGHT FOREFOOT WITHOUT AND WITH CONTRAST TECHNIQUE: Multiplanar, multisequence MR imaging of the right forefoot was performed before and after the administration of intravenous contrast. CONTRAST:  44m63mADAVIST GADOBUTROL 1 MMOL/ML IV SOLN COMPARISON:  None.  FINDINGS: Bones/Joint/Cartilage Large soft tissue wound overlying the fifth metatarsal head. Cortical destruction of the fifth metatarsal head with extensive bone marrow edema throughout the first metatarsal head and extending into the shaft most consistent with osteomyelitis. Bone destruction at the base of the fifth proximal phalanx with associated bone marrow edema consistent with osteomyelitis. Complex fluid collection centered around the fifth MTP joint measuring 2.9 x 2.5 x 1.6 cm extending from the plantar aspect to the dorsal aspect consistent with an abscess. Bone marrow edema in the fifth distal phalanx which may be reactive versus secondary to osteomyelitis. Marrow edema in the head of fourth proximal phalanx without cortical destruction may reflect reactive  edema versus early osteomyelitis. No acute fracture or dislocation. Normal alignment. No joint effusion. Ligaments Collateral ligaments are intact.  Lisfranc ligament is intact. Muscles and Tendons Flexor, peroneal and extensor compartment tendons are intact. Diffuse T2 hyperintense signal throughout the plantar musculature likely neurogenic. An element of myositis is not completely excluded. Soft tissue No other fluid collection or hematoma. No soft tissue mass. Severe soft tissue edema along the dorsal aspect of the foot concerning for cellulitis. IMPRESSION: 1. Large soft tissue wound overlying the fifth metatarsal head. Osteomyelitis of the fifth metatarsal head and base of the fifth proximal phalanx. Complex fluid collection centered around the fifth MTP joint measuring 2.9 x 2.5 x 1.6 cm extending from the plantar aspect to the dorsal aspect consistent with an abscess. Bone marrow edema in the fifth distal phalanx which may be reactive versus secondary to osteomyelitis. 2. Marrow edema in the head of fourth proximal phalanx without cortical destruction may reflect reactive edema versus early osteomyelitis. 3. Severe soft tissue edema along the dorsal aspect of the foot concerning for cellulitis. Electronically Signed   By: Kathreen Devoid   On: 05/21/2021 10:58   DG Chest Port 1 View  Result Date: 06/06/2021 CLINICAL DATA:  Weakness, nausea, and vomiting after PICC line change. Tachycardia. EXAM: PORTABLE CHEST 1 VIEW COMPARISON:  11/19/2018 FINDINGS: Right PICC line with tip over the low SVC region. No pneumothorax. Heart size and pulmonary vascularity are normal. Lungs are clear. Mediastinal contours appear intact. No pleural effusions. IMPRESSION: Right PICC line appears in satisfactory position.  Lungs are clear. Electronically Signed   By: Lucienne Capers M.D.   On: 06/06/2021 23:35   DG Foot 2 Views Right  Result Date: 05/21/2021 CLINICAL DATA:  Status post surgery for osteomyelitis. EXAM: RIGHT  FOOT - 2 VIEW COMPARISON:  05/20/2021. FINDINGS: The fifth toe and the fifth metatarsal from the midshaft distally have been resected. The resected end of the remaining fifth metatarsal is well-defined. Small focus of lucency with resorption of the overlying cortex, along the distal, lateral aspect of the proximal phalanx of the fourth toe. This is not evident on the previous day's study, which would argue against osteomyelitis. Still, a small focus of osteomyelitis is possible. There is evidence of a subtle overlying soft tissue defect. There are no additional areas of bone lucency or resorption to suggest osteomyelitis. Joints are normally spaced and aligned. No soft tissue air. IMPRESSION: 1. Status post resection of the fifth toe and the middle through distal aspect of the fifth metatarsal. 2. Small focus of lucency in the distal lateral aspect of the proximal phalanx of the fourth toe. This could potentially reflect a small focus osteomyelitis. It may be related to the recent surgery. Recommend follow-up imaging in 4-6 weeks to reassess. Electronically Signed   By: Lajean Manes  M.D.   On: 05/21/2021 16:42   DG Foot Complete Right  Result Date: 05/20/2021 CLINICAL DATA:  Wound to distal 5th metatarsal EXAM: RIGHT FOOT COMPLETE - 3+ VIEW COMPARISON:  12/21/2020 FINDINGS: Soft tissue wound/ulceration adjacent to the distal 5th metatarsal head. Adjacent cortical irregularity/destruction involving the 5th metatarsal head and likely the lateral base of the 5th proximal phalanx, suggesting osteomyelitis/septic joint. Mild lateral soft tissue swelling involving the 5th digit. IMPRESSION: Osteomyelitis/septic joint involving the 5th metatarsal head and base of the 5th proximal phalanx. Electronically Signed   By: Julian Hy M.D.   On: 05/20/2021 10:40   [place] EKG SITE RITE  Result Date: 05/26/2021 If Site Rite image not attached, placement could not be confirmed due to current cardiac rhythm.  [place] EKG SITE  RITE  Result Date: 05/25/2021 If Site Rite image not attached, placement could not be confirmed due to current cardiac rhythm.    Subjective: No acute issues or events overnight   Discharge Exam: Vitals:   06/08/21 0800 06/08/21 1100  BP: 123/79 110/74  Pulse: 99   Resp: 16   Temp:  97.9 F (36.6 C)  SpO2: 98%    Vitals:   06/08/21 0650 06/08/21 0700 06/08/21 0800 06/08/21 1100  BP:  123/79 123/79 110/74  Pulse: 98  99   Resp: 14  16   Temp:  98.1 F (36.7 C)  97.9 F (36.6 C)  TempSrc:  Oral  Oral  SpO2: 97%  98%   Weight:      Height:        General: Pt is alert, awake, not in acute distress Cardiovascular: RRR, S1/S2 +, no rubs, no gallops Respiratory: CTA bilaterally, no wheezing, no rhonchi Abdominal: Soft, NT, ND, bowel sounds + Extremities: no edema, no cyanosis, right lower extremity bandage clean dry intact    The results of significant diagnostics from this hospitalization (including imaging, microbiology, ancillary and laboratory) are listed below for reference.     Microbiology: Recent Results (from the past 240 hour(s))  Resp Panel by RT-PCR (Flu A&B, Covid) Nasopharyngeal Swab     Status: None   Collection Time: 06/06/21  8:32 PM   Specimen: Nasopharyngeal Swab; Nasopharyngeal(NP) swabs in vial transport medium  Result Value Ref Range Status   SARS Coronavirus 2 by RT PCR NEGATIVE NEGATIVE Final    Comment: (NOTE) SARS-CoV-2 target nucleic acids are NOT DETECTED.  The SARS-CoV-2 RNA is generally detectable in upper respiratory specimens during the acute phase of infection. The lowest concentration of SARS-CoV-2 viral copies this assay can detect is 138 copies/mL. A negative result does not preclude SARS-Cov-2 infection and should not be used as the sole basis for treatment or other patient management decisions. A negative result may occur with  improper specimen collection/handling, submission of specimen other than nasopharyngeal swab,  presence of viral mutation(s) within the areas targeted by this assay, and inadequate number of viral copies(<138 copies/mL). A negative result must be combined with clinical observations, patient history, and epidemiological information. The expected result is Negative.  Fact Sheet for Patients:  EntrepreneurPulse.com.au  Fact Sheet for Healthcare Providers:  IncredibleEmployment.be  This test is no t yet approved or cleared by the [place] FDA and  has been authorized for detection and/or diagnosis of SARS-CoV-2 by FDA under an Emergency Use Authorization (EUA). This EUA will remain  in effect (meaning this test can be used) for the duration of the COVID-19 declaration under Section 564(b)(1) of the Act, 21 U.S.C.section 360bbb-3(b)(1), unless  the authorization is terminated  or revoked sooner.       Influenza A by PCR NEGATIVE NEGATIVE Final   Influenza B by PCR NEGATIVE NEGATIVE Final    Comment: (NOTE) The Xpert Xpress SARS-CoV-2/FLU/RSV plus assay is intended as an aid in the diagnosis of influenza from Nasopharyngeal swab specimens and should not be used as a sole basis for treatment. Nasal washings and aspirates are unacceptable for Xpert Xpress SARS-CoV-2/FLU/RSV testing.  Fact Sheet for Patients: EntrepreneurPulse.com.au  Fact Sheet for Healthcare Providers: IncredibleEmployment.be  This test is not yet approved or cleared by the [place] FDA and has been authorized for detection and/or diagnosis of SARS-CoV-2 by FDA under an Emergency Use Authorization (EUA). This EUA will remain in effect (meaning this test can be used) for the duration of the COVID-19 declaration under Section 564(b)(1) of the Act, 21 U.S.C. section 360bbb-3(b)(1), unless the authorization is terminated or revoked.  Performed at [facility], [place] [address] [place], [place] [address]   Blood culture  (routine x 2)     Status: None (Preliminary result)   Collection Time: 06/07/21  2:00 AM   Specimen: BLOOD LEFT HAND  Result Value Ref Range Status   Specimen Description BLOOD LEFT HAND  Final   Special Requests   Final    BOTTLES DRAWN AEROBIC AND ANAEROBIC Blood Culture results may not be optimal due to an inadequate volume of blood received in culture bottles   Culture   Final    NO GROWTH 1 DAY Performed at [facility], [place] [address], [place], [place] [address]    Report Status PENDING  Incomplete  Blood culture (routine x 2)     Status: None (Preliminary result)   Collection Time: 06/07/21  2:10 AM   Specimen: BLOOD LEFT FOREARM  Result Value Ref Range Status   Specimen Description BLOOD LEFT FOREARM  Final   Special Requests   Final    BOTTLES DRAWN AEROBIC AND ANAEROBIC Blood Culture results may not be optimal due to an inadequate volume of blood received in culture bottles   Culture   Final    NO GROWTH 1 DAY Performed at [facility], [place] [address], [place], [place] [address]    Report Status PENDING  Incomplete     Labs: BNP (last 3 results) No results for input(s): BNP in the last 8760 hours. Basic Metabolic Panel: Recent Labs  Lab 06/06/21 2051 06/06/21 2153 06/07/21 0649 06/08/21 0347  NA 134* 134* 133* 135  K 3.9 3.8 3.9 3.5  CL  --  97* 97* 98  CO2  --  24 23 27   GLUCOSE  --  275* 299* 156*  BUN  --  12 9 5*  CREATININE  --  0.86 0.85 0.81  CALCIUM  --  9.6 8.6* 9.3   Liver Function Tests: Recent Labs  Lab 06/06/21 2153 06/07/21 0649 06/08/21 0347  AST 36 28 42*  ALT 48* 39 43  ALKPHOS 71 51 66  BILITOT 1.1 1.2 0.8  PROT 7.9 6.6 7.1  ALBUMIN 3.6 3.0* 3.3*   No results for input(s): LIPASE, AMYLASE in the last 168 hours. No results for input(s): AMMONIA in the last 168 hours. CBC: Recent Labs  Lab 06/06/21 2051 06/06/21 2153 06/07/21 0251 06/08/21 0347  WBC  --  12.0* 13.9* 6.8  NEUTROABS  --  10.8*  --   --   HGB 14.3  13.8 13.6 12.4*  HCT 42.0 41.8  41.1 36.9*  MCV  --  83.9 84.7 83.7  PLT  --  260 226 212   Cardiac Enzymes: No results for input(s): CKTOTAL, CKMB, CKMBINDEX, TROPONINI in the last 168 hours. BNP: Invalid input(s): POCBNP CBG: Recent Labs  Lab 06/07/21 1749 06/07/21 1829 06/07/21 2118 06/08/21 0759 06/08/21 1133  GLUCAP 78 84 189* 163* 151*   D-Dimer No results for input(s): DDIMER in the last 72 hours. Hgb A1c No results for input(s): HGBA1C in the last 72 hours. Lipid Profile No results for input(s): CHOL, HDL, LDLCALC, TRIG, CHOLHDL, LDLDIRECT in the last 72 hours. Thyroid function studies Recent Labs    06/07/21 0251  TSH 0.277*   Anemia work up No results for input(s): VITAMINB12, FOLATE, FERRITIN, TIBC, IRON, RETICCTPCT in the last 72 hours. Urinalysis    Component Value Date/Time   COLORURINE YELLOW 06/06/2021 2204   APPEARANCEUR CLEAR 06/06/2021 2204   LABSPEC 1.024 06/06/2021 2204   PHURINE 6.0 06/06/2021 2204   GLUCOSEU 150 (A) 06/06/2021 2204   HGBUR NEGATIVE 06/06/2021 2204   BILIRUBINUR NEGATIVE 06/06/2021 2204   KETONESUR 20 (A) 06/06/2021 2204   PROTEINUR 30 (A) 06/06/2021 2204   NITRITE NEGATIVE 06/06/2021 2204   LEUKOCYTESUR NEGATIVE 06/06/2021 2204   Sepsis Labs Invalid input(s): PROCALCITONIN,  WBC,  LACTICIDVEN Microbiology Recent Results (from the past 240 hour(s))  Resp Panel by RT-PCR (Flu A&B, Covid) Nasopharyngeal Swab     Status: None   Collection Time: 06/06/21  8:32 PM   Specimen: Nasopharyngeal Swab; Nasopharyngeal(NP) swabs in vial transport medium  Result Value Ref Range Status   SARS Coronavirus 2 by RT PCR NEGATIVE NEGATIVE Final    Comment: (NOTE) SARS-CoV-2 target nucleic acids are NOT DETECTED.  The SARS-CoV-2 RNA is generally detectable in upper respiratory specimens during the acute phase of infection. The lowest concentration of SARS-CoV-2 viral copies this assay can detect is 138 copies/mL. A negative result does  not preclude SARS-Cov-2 infection and should not be used as the sole basis for treatment or other patient management decisions. A negative result may occur with  improper specimen collection/handling, submission of specimen other than nasopharyngeal swab, presence of viral mutation(s) within the areas targeted by this assay, and inadequate number of viral copies(<138 copies/mL). A negative result must be combined with clinical observations, patient history, and epidemiological information. The expected result is Negative.  Fact Sheet for Patients:  EntrepreneurPulse.com.au  Fact Sheet for Healthcare Providers:  IncredibleEmployment.be  This test is no t yet approved or cleared by the [place] FDA and  has been authorized for detection and/or diagnosis of SARS-CoV-2 by FDA under an Emergency Use Authorization (EUA). This EUA will remain  in effect (meaning this test can be used) for the duration of the COVID-19 declaration under Section 564(b)(1) of the Act, 21 U.S.C.section 360bbb-3(b)(1), unless the authorization is terminated  or revoked sooner.       Influenza A by PCR NEGATIVE NEGATIVE Final   Influenza B by PCR NEGATIVE NEGATIVE Final    Comment: (NOTE) The Xpert Xpress SARS-CoV-2/FLU/RSV plus assay is intended as an aid in the diagnosis of influenza from Nasopharyngeal swab specimens and should not be used as a sole basis for treatment. Nasal washings and aspirates are unacceptable for Xpert Xpress SARS-CoV-2/FLU/RSV testing.  Fact Sheet for Patients: EntrepreneurPulse.com.au  Fact Sheet for Healthcare Providers: IncredibleEmployment.be  This test is not yet approved or cleared by the [place] FDA and has been authorized for detection and/or diagnosis of SARS-CoV-2 by FDA  under an Emergency Use Authorization (EUA). This EUA will remain in effect (meaning this test can be used) for the  duration of the COVID-19 declaration under Section 564(b)(1) of the Act, 21 U.S.C. section 360bbb-3(b)(1), unless the authorization is terminated or revoked.  Performed at [facility], [place] [address], [place], [place] [address]   Blood culture (routine x 2)     Status: None (Preliminary result)   Collection Time: 06/07/21  2:00 AM   Specimen: BLOOD LEFT HAND  Result Value Ref Range Status   Specimen Description BLOOD LEFT HAND  Final   Special Requests   Final    BOTTLES DRAWN AEROBIC AND ANAEROBIC Blood Culture results may not be optimal due to an inadequate volume of blood received in culture bottles   Culture   Final    NO GROWTH 1 DAY Performed at [facility], [place] [address] [place], [place] [address]    Report Status PENDING  Incomplete  Blood culture (routine x 2)     Status: None (Preliminary result)   Collection Time: 06/07/21  2:10 AM   Specimen: BLOOD LEFT FOREARM  Result Value Ref Range Status   Specimen Description BLOOD LEFT FOREARM  Final   Special Requests   Final    BOTTLES DRAWN AEROBIC AND ANAEROBIC Blood Culture results may not be optimal due to an inadequate volume of blood received in culture bottles   Culture   Final    NO GROWTH 1 DAY Performed at [facility], [place] [address] [place], [place] [address]    Report Status PENDING  Incomplete     Time coordinating discharge: Over 30 minutes  SIGNED:   Little Ishikawa, DO [facility] 06/08/2021, 12:53 PM Pager   If 7PM-7AM, please contact night-coverage [email or web address]

## 2021-06-12 LAB — CULTURE, BLOOD (ROUTINE X 2)
Culture: NO GROWTH
Culture: NO GROWTH

## 2021-06-13 ENCOUNTER — Other Ambulatory Visit: Payer: Self-pay

## 2021-06-13 ENCOUNTER — Ambulatory Visit (INDEPENDENT_AMBULATORY_CARE_PROVIDER_SITE_OTHER): Payer: BC Managed Care – PPO | Admitting: Podiatry

## 2021-06-13 DIAGNOSIS — Z9889 Other specified postprocedural states: Secondary | ICD-10-CM

## 2021-06-13 NOTE — Progress Notes (Signed)
  Subjective:  Patient ID: [name], male    DOB: 09-20-1996,  MRN: [id number]  Chief Complaint  Patient presents with   Routine Post Op    Wound check/amputation. Denies f/c/n/v. Manageable pain lvls. Denies any other concerns.     DOS: 5/29 Procedure: 5th ray amputation [name])  DOS: 05/24/21 Procedure: delayed wound closure ([name])  25 y.o. male presents with the above complaint. History confirmed with patient.   Objective:  Physical Exam: tenderness at the surgical site, local edema noted, and calf supple, nontender. 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 Post-operative state     Plan:  Patient was evaluated and treated and all questions answered.  Post-operative State -MRI reviewed. Likely reactive marrow edema 4th MPJ rather than infectious etiology. He does have some pressure necrosis at the plantar 4th metatarsal. I am concerned that may be a recurrent issue as he developing necrosis with continued pressure. Offload in CAM Boot -Sutures removed -Ok to start showering at this time. Advised they cannot soak. -Dressing applied consisting of sterile gauze, kerlix, and ACE bandage -WBAT in CAM boot -XRs needed at follow-up: none -Stapled removal in 1 week.  No follow-ups on file.

## 2021-06-20 ENCOUNTER — Encounter: Payer: Self-pay | Admitting: Podiatry

## 2021-06-20 ENCOUNTER — Telehealth: Payer: Self-pay

## 2021-06-20 ENCOUNTER — Ambulatory Visit (INDEPENDENT_AMBULATORY_CARE_PROVIDER_SITE_OTHER): Payer: BC Managed Care – PPO | Admitting: Podiatry

## 2021-06-20 ENCOUNTER — Ambulatory Visit (INDEPENDENT_AMBULATORY_CARE_PROVIDER_SITE_OTHER): Payer: BC Managed Care – PPO | Admitting: Infectious Diseases

## 2021-06-20 ENCOUNTER — Other Ambulatory Visit: Payer: Self-pay

## 2021-06-20 VITALS — BP 101/66 | HR 99 | Temp 98.2°F | Wt 217.0 lb

## 2021-06-20 DIAGNOSIS — E0865 Diabetes mellitus due to underlying condition with hyperglycemia: Secondary | ICD-10-CM | POA: Diagnosis not present

## 2021-06-20 DIAGNOSIS — Z794 Long term (current) use of insulin: Secondary | ICD-10-CM

## 2021-06-20 DIAGNOSIS — Z9889 Other specified postprocedural states: Secondary | ICD-10-CM

## 2021-06-20 DIAGNOSIS — Z5181 Encounter for therapeutic drug level monitoring: Secondary | ICD-10-CM | POA: Insufficient documentation

## 2021-06-20 DIAGNOSIS — M86171 Other acute osteomyelitis, right ankle and foot: Secondary | ICD-10-CM | POA: Diagnosis not present

## 2021-06-20 MED ORDER — SILVER SULFADIAZINE 1 % EX CREA: TOPICAL_CREAM | CUTANEOUS | 0 refills | Status: DC

## 2021-06-20 NOTE — Progress Notes (Signed)
[facility]                                                             [place], [place], [place], [address]                                                                  Phn. 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 OR cultures with MRSA. S/p RT foot secondary wound closure on 6/1 Normal ABI in 01/23/21   Medication Monitoring   Plan Continue Daptomycin as is, End date is 07/05/21.  PICC line to be removed post end date, will inform [facility] for PICC line removal  Emphasized on BG control  Will request labs from [facility] for medication monitoring  Follow up with Podiatry as instructed Follow up with me as needed   All questions and concerns were discussed and addressed. Patient verbalized understanding of the plan. ____________________________________________________________________________________________________________________ Interval Events Here for follow up with his mother for rt foot osteomyelitis. Getting IV daptomycin through PICC line. Denies any pain/tenderness and swelling at the PICC line site. Denies any nausea/vomiting and diarrhea. 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 Denies any  pain/tenderness and swelling at the surgical site.    ROS: Constitutional: Negative for fever, chills, activity change, appetite change, fatigue and unexpected weight change.  Respiratory: Negative for cough, shortness of breath Cardiovascular: Negative for chest pain, palpitations and leg swelling.  Gastrointestinal: Negative for nausea, vomiting, abdominal pain, diarrhea/constipation, .   Past Medical History:  Diagnosis Date   Complication of anesthesia    Hard to wake up when he was younger once.   Diabetes mellitus, type II ([place])    Heart murmur    Hypertension    Past Surgical History:  Procedure Laterality Date   AMPUTATION Right 05/21/2021   Procedure: AMPUTATION RAY 5th;  Surgeon: Trula Slade, DPM;  Location: [facility];  Service: Podiatry;  Laterality: Right;  PALATE / UVULA BIOPSY / EXCISION     "growth removed"   UPPER GASTROINTESTINAL ENDOSCOPY     Done in [place] - 2013 ish   WOUND DEBRIDEMENT Right 05/24/2021   Procedure: RIGHT FOOT WOUND DEBRIDEMENT AND CLOSURE;  Surgeon: Evelina Bucy, DPM;  Location: [facility];  Service: Podiatry;  Laterality: Right;   Current Outpatient Medications on File Prior to Visit  Medication Sig Dispense Refill   acetaminophen (TYLENOL) 500 MG tablet Take 1,000 mg by mouth every 6 (six) hours as needed for moderate pain or headache.     aspirin EC 81 MG tablet Take 1 tablet (81 mg total) by mouth daily.     atorvastatin (LIPITOR) 10 MG tablet Take 1 tablet (10 mg total) by mouth daily. 30 tablet 2   blood glucose meter kit and supplies KIT Dispense based on patient and insurance preference. Use up to four times daily as directed. (FOR ICD-10 E11.65). 1 each 5   carvedilol (COREG) 25 MG tablet Take 1 tablet (25 mg total) by mouth 2 (two) times daily. 60 tablet 6   daptomycin (CUBICIN) IVPB Inject 800 mg into the vein daily. Indication: Ostetmyelitis/septic arthritis First Dose: Yes Last Day of Therapy:  07/05/21 Labs - Once weekly:   CBC/D, BMP, and CPK Labs - Every other week:  ESR and CRP Method of administration: IV Push Method of administration may be changed at the discretion of home infusion pharmacist based upon assessment of the patient and/or caregiver's ability to self-administer the medication ordered. 40 Units 0   hydrOXYzine (ATARAX/VISTARIL) 50 MG tablet Take 50 mg by mouth in the morning and at bedtime.     ibuprofen (ADVIL) 800 MG tablet TAKE 1 TABLET BY MOUTH EVERY 8 HOURS AS NEEDED 90 tablet 0   insulin glargine (LANTUS SOLOSTAR) 100 UNIT/ML Solostar Pen Inject 35 Units into the skin 2 (two) times daily. 45 mL 3   insulin lispro (HUMALOG KWIKPEN) 200 UNIT/ML KwikPen Inject under skin 10-14 units 3x a day before meals 45 mL 3   Insulin Pen Needle (CARETOUCH PEN NEEDLES) 31G X 6 MM MISC Use 6x a day 400 each 3   isosorbide mononitrate (IMDUR) 30 MG 24 hr tablet Take 1 tablet (30 mg total) by mouth daily. 30 tablet 2   Lancet Devices (ACCU-CHEK SOFTCLIX) lancets Use as instructed 4 x daily. e11.65 150 each 5   Lancets (ACCU-CHEK SOFT TOUCH) lancets Use as instructed 100 each 12   Multiple Vitamin (MULTIVITAMIN WITH MINERALS) TABS tablet Take 1 tablet by mouth daily. 30 tablet 0   Naphazoline HCl (CLEAR EYES OP) Place 1 drop into the left eye daily as needed (dry eyes).     omeprazole (PRILOSEC) 20 MG capsule Take 20 mg by mouth daily.   1   OVER THE COUNTER MEDICATION Place 4-5 drops into the left ear daily as needed (ringing in ear). Ear ringing medication     OZEMPIC, 1 MG/DOSE, 4 MG/3ML SOPN Inject 1 mg into the skin once a week.     zolpidem (AMBIEN) 10 MG tablet Take 10 mg by mouth at bedtime.     nitroGLYCERIN (NITROSTAT) 0.4 MG SL tablet Place 1 tablet (0.4 mg total) under the tongue every 5 (five) minutes as needed for chest pain. 25 tablet 3   No current facility-administered medications on file prior to visit.   Allergies  Allergen Reactions   Amoxicillin Hives    Has patient had a PCN reaction  causing immediate rash,  facial/tongue/throat swelling, SOB or lightheadedness with hypotension: No Has patient had a PCN reaction causing severe rash involving mucus membranes or skin necrosis: No Has patient had a PCN reaction that required hospitalization: No Has patient had a PCN reaction occurring within the last 10 years: No If all of the above answers are "NO", then may proceed with Cephalosporin use.    Social History   Socioeconomic History   Marital status: Single    Spouse name: Not on file   Number of children: Not on file   Years of education: Not on file   Highest education level: Not on file  Occupational History   Not on file  Tobacco Use   Smoking status: Passive Smoke Exposure - Never Smoker   Smokeless tobacco: Never  Vaping Use   Vaping Use: Never used  Substance and Sexual Activity   Alcohol use: No   Drug use: No   Sexual activity: Not on file  Other Topics Concern   Not on file  Social History Narrative   Not on file   Social Determinants of Health   Financial Resource Strain: Not on file  Food Insecurity: Not on file  Transportation Needs: Not on file  Physical Activity: Not on file  Stress: Not on file  Social Connections: Not on file  Intimate Partner Violence: Not on file    Vitals BP 101/66   Pulse 99   Temp 98.2 F (36.8 C) (Oral)   Wt 217 lb (98.4 kg)   BMI 27.12 kg/m   Examination  General - not in acute distress, comfortably sitting in chair HEENT - no pallor and no icterus Chest - b/l clear air entry, no additional sounds CVS- Regular rate and rhythm Abdomen - Soft, Non tender , non distended Ext-      Neuro: grossly non focal  Psych : calm and cooperative   Recent labs CBC Latest Ref Rng & Units 06/08/2021 06/07/2021 06/06/2021  WBC 4.0 - 10.5 K/uL 6.8 13.9(H) 12.0(H)  Hemoglobin 13.0 - 17.0 g/dL 12.4(L) 13.6 13.8  Hematocrit 39.0 - 52.0 % 36.9(L) 41.1 41.8  Platelets 150 - 400 K/uL 212 226 260   CMP Latest Ref Rng  & Units 06/08/2021 06/07/2021 06/06/2021  Glucose 70 - 99 mg/dL 156(H) 299(H) 275(H)  BUN 6 - 20 mg/dL 5(L) 9 12  Creatinine 0.61 - 1.24 mg/dL 0.81 0.85 0.86  Sodium 135 - 145 mmol/L 135 133(L) 134(L)  Potassium 3.5 - 5.1 mmol/L 3.5 3.9 3.8  Chloride 98 - 111 mmol/L 98 97(L) 97(L)  CO2 22 - 32 mmol/L _0 Calcium 8.9 - 10.3 mg/dL 9.3 8.6(L) 9.6  Total Protein 6.5 - 8.1 g/dL 7.1 6.6 7.9  Total Bilirubin 0.3 - 1.2 mg/dL 0.8 1.2 1.1  Alkaline Phos 38 - 126 U/L 66 51 71  AST 15 - 41 U/L 42(H) 28 36  ALT 0 - 44 U/L 43 39 48(H)     Pertinent Microbiology Results for orders placed or performed during the hospital encounter of 06/06/21  Resp Panel by RT-PCR (Flu A&B, Covid) Nasopharyngeal Swab     Status: None   Collection Time: 06/06/21  8:32 PM   Specimen: Nasopharyngeal Swab; Nasopharyngeal(NP) swabs in vial transport medium  Result Value Ref Range Status   SARS Coronavirus 2 by RT PCR NEGATIVE NEGATIVE Final    Comment: (NOTE) SARS-CoV-2 target nucleic acids are NOT DETECTED.  The SARS-CoV-2 RNA is generally detectable in upper respiratory specimens during the acute phase of  infection. The lowest concentration of SARS-CoV-2 viral copies this assay can detect is 138 copies/mL. A negative result does not preclude SARS-Cov-2 infection and should not be used as the sole basis for treatment or other patient management decisions. A negative result may occur with  improper specimen collection/handling, submission of specimen other than nasopharyngeal swab, presence of viral mutation(s) within the areas targeted by this assay, and inadequate number of viral copies(<138 copies/mL). A negative result must be combined with clinical observations, patient history, and epidemiological information. The expected result is Negative.  Fact Sheet for Patients:  EntrepreneurPulse.com.au  Fact Sheet for Healthcare Providers:  IncredibleEmployment.be  This  test is no t yet approved or cleared by the [place] FDA and  has been authorized for detection and/or diagnosis of SARS-CoV-2 by FDA under an Emergency Use Authorization (EUA). This EUA will remain  in effect (meaning this test can be used) for the duration of the COVID-19 declaration under Section 564(b)(1) of the Act, 21 U.S.C.section 360bbb-3(b)(1), unless the authorization is terminated  or revoked sooner.       Influenza A by PCR NEGATIVE NEGATIVE Final   Influenza B by PCR NEGATIVE NEGATIVE Final    Comment: (NOTE) The Xpert Xpress SARS-CoV-2/FLU/RSV plus assay is intended as an aid in the diagnosis of influenza from Nasopharyngeal swab specimens and should not be used as a sole basis for treatment. Nasal washings and aspirates are unacceptable for Xpert Xpress SARS-CoV-2/FLU/RSV testing.  Fact Sheet for Patients: EntrepreneurPulse.com.au  Fact Sheet for Healthcare Providers: IncredibleEmployment.be  This test is not yet approved or cleared by the [place] FDA and has been authorized for detection and/or diagnosis of SARS-CoV-2 by FDA under an Emergency Use Authorization (EUA). This EUA will remain in effect (meaning this test can be used) for the duration of the COVID-19 declaration under Section 564(b)(1) of the Act, 21 U.S.C. section 360bbb-3(b)(1), unless the authorization is terminated or revoked.  Performed at [facility], [place] [address] [place], [place] [address]   Blood culture (routine x 2)     Status: None   Collection Time: 06/07/21  2:00 AM   Specimen: BLOOD LEFT HAND  Result Value Ref Range Status   Specimen Description BLOOD LEFT HAND  Final   Special Requests   Final    BOTTLES DRAWN AEROBIC AND ANAEROBIC Blood Culture results may not be optimal due to an inadequate volume of blood received in culture bottles   Culture   Final    NO GROWTH 5 DAYS Performed at [facility], [place] [address] [place], [place] [address]    Report Status 06/12/2021 FINAL  Final  Blood culture (routine x 2)     Status: None   Collection Time: 06/07/21  2:10 AM   Specimen: BLOOD LEFT FOREARM  Result Value Ref Range Status   Specimen Description BLOOD LEFT FOREARM  Final   Special Requests   Final    BOTTLES DRAWN AEROBIC AND ANAEROBIC Blood Culture results may not be optimal due to an inadequate volume of blood received in culture bottles   Culture   Final    NO GROWTH 5 DAYS Performed at [facility], [place] [address] [place], [place] [address]    Report Status 06/12/2021 FINAL  Final    Pertinent Imaging All pertinent labs/Imagings/notes reviewed. All pertinent plain films and CT images have been personally visualized and interpreted; radiology reports have been reviewed. Decision making incorporated into the Impression / Recommendations.  have spent a total of 35 minutes of face-to-face and non-face-to-face time, excluding clinical staff time, preparing to see patient, ordering tests and/or medications, and provide counseling the patient   Electronically signed by:  Rosiland Oz, MD Infectious Disease Physician [facility] [address] [place], [place] [address] Phone: [phone]  Fax: [phone]

## 2021-06-20 NOTE — Telephone Encounter (Signed)
[name] spoke with [name] at [facility] to stop IV antibiotics and remove PICC line on 07/05/2021 per Dr.Manandhar. [name] verbalized understanding.   [name]

## 2021-06-23 NOTE — Progress Notes (Signed)
  Subjective:  Patient ID: [name], male    DOB: 1996/07/29,  MRN: [id number]  Chief Complaint  Patient presents with   Routine Post Op    Doing well. Denies f/c/n/v. Manageable pain lvls. Swelling in 3 toes.     DOS: 5/29 Procedure: 5th ray amputation Jacqualyn Posey)  DOS: 05/24/21 Procedure: delayed wound closure (Khamora Karan)  25 y.o. male presents with the above complaint. History confirmed with patient.   Objective:  Physical Exam: tenderness at the surgical site, local edema noted, and calf supple, nontender. 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 Post-operative state    Plan:  Patient was evaluated and treated and all questions answered.  Post-operative State -Plantar fourth met pressure appears to be improving with no further necrosis due to CAM boot continue this for the time being -Staples removed -WBAT in CAM boot  Return in about 2 weeks (around 07/04/2021) for Post-Op (No XRs).

## 2021-06-27 ENCOUNTER — Encounter: Payer: Self-pay | Admitting: Infectious Diseases

## 2021-06-27 NOTE — Progress Notes (Signed)
06/14/21 Wbc 7 Hb 11.5 Plts 308 Cr 0.59 ESR 37 CRP 31 CPK 72

## 2021-06-28 ENCOUNTER — Ambulatory Visit (HOSPITAL_COMMUNITY): Payer: BC Managed Care – PPO | Admitting: Occupational Therapy

## 2021-06-28 ENCOUNTER — Ambulatory Visit (HOSPITAL_COMMUNITY): Payer: BC Managed Care – PPO | Admitting: Physical Therapy

## 2021-07-04 ENCOUNTER — Encounter: Payer: Self-pay | Admitting: Podiatry

## 2021-07-04 ENCOUNTER — Other Ambulatory Visit: Payer: Self-pay

## 2021-07-04 ENCOUNTER — Ambulatory Visit (INDEPENDENT_AMBULATORY_CARE_PROVIDER_SITE_OTHER): Payer: BC Managed Care – PPO | Admitting: Podiatry

## 2021-07-04 DIAGNOSIS — L97511 Non-pressure chronic ulcer of other part of right foot limited to breakdown of skin: Secondary | ICD-10-CM | POA: Diagnosis not present

## 2021-07-04 NOTE — Progress Notes (Signed)
  Subjective:  Patient ID: [name], male    DOB: [birth year],  MRN: [id number]  Chief Complaint  Patient presents with   Routine Post Op    Routine post op visit. / when in pain pain is a 4-5/10 when in pain pt is taking over the counter medications. No discharge or bleeding    DOS: 5/29 Procedure: 5th ray amputation [name])  DOS: 05/24/21 Procedure: delayed wound closure ([name])  25 y.o. male presents with the above complaint. History confirmed with patient.   Objective:  Physical Exam: tenderness at the surgical site, local edema noted, and calf supple, nontender. Incision: small 1cmx0.5 ulcer without warmth, erythema, SOI otherwise healed. Plantar 4th met head with small area of skin breakdown - 0.5cm diameter. Wound granular, no warmth, erythema, SOI.  Assessment:   1. Ulcer of right foot limited to breakdown of skin [name])     Plan:  Patient was evaluated and treated and all questions answered.  Post-operative State -Mostly healed. SSD and band-aid to small open area.  Submet 4 ulcer -Plantar fourth met pressure now ulceration but this may be from previous pressure. Will continue to monitor and offload with boot. New short boot dispensed today. Should issues persist consider 4th met floating osteotomy and or fabricate new DM inserts. -Dressed with SSD and band-aid. Continue daily. XRs needed at follow-up: 3 view Foot with marker under plantar ulceration   Return in about 3 weeks (around 07/25/2021) for Wound Care, with XRs.

## 2021-07-06 ENCOUNTER — Telehealth: Payer: Self-pay

## 2021-07-06 NOTE — Telephone Encounter (Signed)
Verbal orders given to [facility] to pull PICC based on chart review.   Alyzah Pelly Lorita Officer, RN

## 2021-07-13 ENCOUNTER — Ambulatory Visit (HOSPITAL_COMMUNITY): Payer: BC Managed Care – PPO | Admitting: Occupational Therapy

## 2021-07-13 ENCOUNTER — Ambulatory Visit (HOSPITAL_COMMUNITY): Payer: BC Managed Care – PPO | Attending: Family Medicine | Admitting: Physical Therapy

## 2021-07-14 ENCOUNTER — Other Ambulatory Visit: Payer: Self-pay

## 2021-07-14 ENCOUNTER — Emergency Department (HOSPITAL_COMMUNITY)
Admission: EM | Admit: 2021-07-14 | Discharge: 2021-07-15 | Disposition: A | Discharge status: Home / Self Care | Payer: BC Managed Care – PPO | Attending: Emergency Medicine | Admitting: Emergency Medicine

## 2021-07-14 ENCOUNTER — Encounter (HOSPITAL_COMMUNITY): Payer: Self-pay | Admitting: Emergency Medicine

## 2021-07-14 DIAGNOSIS — Z79899 Other long term (current) drug therapy: Secondary | ICD-10-CM | POA: Diagnosis not present

## 2021-07-14 DIAGNOSIS — D72829 Elevated white blood cell count, unspecified: Secondary | ICD-10-CM

## 2021-07-14 DIAGNOSIS — R112 Nausea with vomiting, unspecified: Secondary | ICD-10-CM | POA: Insufficient documentation

## 2021-07-14 DIAGNOSIS — Y900 Blood alcohol level of less than 20 mg/100 ml: Secondary | ICD-10-CM | POA: Diagnosis not present

## 2021-07-14 DIAGNOSIS — I10 Essential (primary) hypertension: Secondary | ICD-10-CM | POA: Insufficient documentation

## 2021-07-14 DIAGNOSIS — Z7982 Long term (current) use of aspirin: Secondary | ICD-10-CM | POA: Diagnosis not present

## 2021-07-14 DIAGNOSIS — I639 Cerebral infarction, unspecified: Secondary | ICD-10-CM | POA: Diagnosis not present

## 2021-07-14 DIAGNOSIS — R42 Dizziness and giddiness: Secondary | ICD-10-CM | POA: Diagnosis not present

## 2021-07-14 DIAGNOSIS — Z794 Long term (current) use of insulin: Secondary | ICD-10-CM | POA: Diagnosis not present

## 2021-07-14 DIAGNOSIS — Z20822 Contact with and (suspected) exposure to covid-19: Secondary | ICD-10-CM | POA: Diagnosis not present

## 2021-07-14 DIAGNOSIS — Z7722 Contact with and (suspected) exposure to environmental tobacco smoke (acute) (chronic): Secondary | ICD-10-CM | POA: Insufficient documentation

## 2021-07-14 DIAGNOSIS — F419 Anxiety disorder, unspecified: Secondary | ICD-10-CM | POA: Insufficient documentation

## 2021-07-14 DIAGNOSIS — E1140 Type 2 diabetes mellitus with diabetic neuropathy, unspecified: Secondary | ICD-10-CM | POA: Diagnosis not present

## 2021-07-14 DIAGNOSIS — R202 Paresthesia of skin: Secondary | ICD-10-CM | POA: Diagnosis not present

## 2021-07-14 DIAGNOSIS — R200 Anesthesia of skin: Secondary | ICD-10-CM

## 2021-07-14 LAB — URINALYSIS, ROUTINE W REFLEX MICROSCOPIC
Bacteria, UA: NONE SEEN
Bilirubin Urine: NEGATIVE
Glucose, UA: NEGATIVE mg/dL
Hgb urine dipstick: NEGATIVE
Ketones, ur: NEGATIVE mg/dL
Leukocytes,Ua: NEGATIVE
Nitrite: NEGATIVE
Protein, ur: 30 mg/dL — AB
Specific Gravity, Urine: 1.018 (ref 1.005–1.030)
pH: 8 (ref 5.0–8.0)

## 2021-07-14 LAB — RAPID URINE DRUG SCREEN, HOSP PERFORMED
Amphetamines: NOT DETECTED
Barbiturates: NOT DETECTED
Benzodiazepines: NOT DETECTED
Cocaine: NOT DETECTED
Opiates: NOT DETECTED
Tetrahydrocannabinol: NOT DETECTED

## 2021-07-14 NOTE — ED Triage Notes (Signed)
Pt c/o tingling to left arm and  warm/hot feeling in groin. Pt states he started Effexor for anxiety this am.

## 2021-07-15 ENCOUNTER — Emergency Department (HOSPITAL_COMMUNITY): Payer: BC Managed Care – PPO

## 2021-07-15 DIAGNOSIS — Z7722 Contact with and (suspected) exposure to environmental tobacco smoke (acute) (chronic): Secondary | ICD-10-CM | POA: Diagnosis not present

## 2021-07-15 DIAGNOSIS — Y900 Blood alcohol level of less than 20 mg/100 ml: Secondary | ICD-10-CM | POA: Diagnosis not present

## 2021-07-15 DIAGNOSIS — F419 Anxiety disorder, unspecified: Secondary | ICD-10-CM | POA: Diagnosis present

## 2021-07-15 DIAGNOSIS — E1140 Type 2 diabetes mellitus with diabetic neuropathy, unspecified: Secondary | ICD-10-CM | POA: Diagnosis not present

## 2021-07-15 DIAGNOSIS — Z794 Long term (current) use of insulin: Secondary | ICD-10-CM | POA: Diagnosis not present

## 2021-07-15 DIAGNOSIS — R112 Nausea with vomiting, unspecified: Secondary | ICD-10-CM | POA: Diagnosis not present

## 2021-07-15 DIAGNOSIS — R202 Paresthesia of skin: Secondary | ICD-10-CM | POA: Diagnosis not present

## 2021-07-15 DIAGNOSIS — I639 Cerebral infarction, unspecified: Secondary | ICD-10-CM | POA: Diagnosis not present

## 2021-07-15 DIAGNOSIS — Z20822 Contact with and (suspected) exposure to covid-19: Secondary | ICD-10-CM | POA: Diagnosis not present

## 2021-07-15 DIAGNOSIS — I10 Essential (primary) hypertension: Secondary | ICD-10-CM | POA: Diagnosis not present

## 2021-07-15 DIAGNOSIS — R42 Dizziness and giddiness: Secondary | ICD-10-CM | POA: Diagnosis not present

## 2021-07-15 DIAGNOSIS — Z7982 Long term (current) use of aspirin: Secondary | ICD-10-CM | POA: Diagnosis not present

## 2021-07-15 DIAGNOSIS — Z79899 Other long term (current) drug therapy: Secondary | ICD-10-CM | POA: Diagnosis not present

## 2021-07-15 LAB — CBC WITH DIFFERENTIAL/PLATELET
Abs Immature Granulocytes: 0.15 10*3/uL — ABNORMAL HIGH (ref 0.00–0.07)
Basophils Absolute: 0.1 10*3/uL (ref 0.0–0.1)
Basophils Relative: 1 %
Eosinophils Absolute: 0.1 10*3/uL (ref 0.0–0.5)
Eosinophils Relative: 0 %
HCT: 45.2 % (ref 39.0–52.0)
Hemoglobin: 14.8 g/dL (ref 13.0–17.0)
Immature Granulocytes: 1 %
Lymphocytes Relative: 12 %
Lymphs Abs: 2.3 10*3/uL (ref 0.7–4.0)
MCH: 28.1 pg (ref 26.0–34.0)
MCHC: 32.7 g/dL (ref 30.0–36.0)
MCV: 85.8 fL (ref 80.0–100.0)
Monocytes Absolute: 0.9 10*3/uL (ref 0.1–1.0)
Monocytes Relative: 5 %
Neutro Abs: 15.4 10*3/uL — ABNORMAL HIGH (ref 1.7–7.7)
Neutrophils Relative %: 81 %
Platelets: 383 10*3/uL (ref 150–400)
RBC: 5.27 MIL/uL (ref 4.22–5.81)
RDW: 13 % (ref 11.5–15.5)
WBC: 18.9 10*3/uL — ABNORMAL HIGH (ref 4.0–10.5)
nRBC: 0 % (ref 0.0–0.2)

## 2021-07-15 LAB — COMPREHENSIVE METABOLIC PANEL
ALT: 20 U/L (ref 0–44)
AST: 19 U/L (ref 15–41)
Albumin: 4.7 g/dL (ref 3.5–5.0)
Alkaline Phosphatase: 85 U/L (ref 38–126)
Anion gap: 9 (ref 5–15)
BUN: 12 mg/dL (ref 6–20)
CO2: 27 mmol/L (ref 22–32)
Calcium: 9.8 mg/dL (ref 8.9–10.3)
Chloride: 102 mmol/L (ref 98–111)
Creatinine, Ser: 0.63 mg/dL (ref 0.61–1.24)
GFR, Estimated: >60 mL/min (ref >60)
Glucose, Bld: 87 mg/dL (ref 70–99)
Potassium: 3.7 mmol/L (ref 3.5–5.1)
Sodium: 138 mmol/L (ref 135–145)
Total Bilirubin: 0.8 mg/dL (ref 0.3–1.2)
Total Protein: 9.1 g/dL — ABNORMAL HIGH (ref 6.5–8.1)

## 2021-07-15 LAB — TROPONIN I (HIGH SENSITIVITY)
Troponin I (High Sensitivity): <2 ng/L (ref <18)
Troponin I (High Sensitivity): <2 ng/L (ref <18)

## 2021-07-15 LAB — RESP PANEL BY RT-PCR (FLU A&B, COVID) ARPGX2
Influenza A by PCR: NEGATIVE
Influenza B by PCR: NEGATIVE
SARS Coronavirus 2 by RT PCR: NEGATIVE

## 2021-07-15 LAB — PROTIME-INR
INR: 1 (ref 0.8–1.2)
Prothrombin Time: 12.8 seconds (ref 11.4–15.2)

## 2021-07-15 LAB — ETHANOL: Alcohol, Ethyl (B): <10 mg/dL (ref <10)

## 2021-07-15 LAB — APTT: aPTT: 32 seconds (ref 24–36)

## 2021-07-15 IMAGING — CT CT HEAD W/O CM
3 series · 16 of 47 positions shown, 19 images · non-contrast
Comparison: None.

CLINICAL DATA: Left arm tingling.

EXAM:
CT HEAD WITHOUT CONTRAST
TECHNIQUE: Contiguous axial images were obtained from the base of the skull
through the vertex without intravenous contrast.

[Series 2: head w o · axial · 0.45mm/px · z∈[+57,+187]mm · 10 of 32 slices shown, 13 images]
[im 3/32  brain]
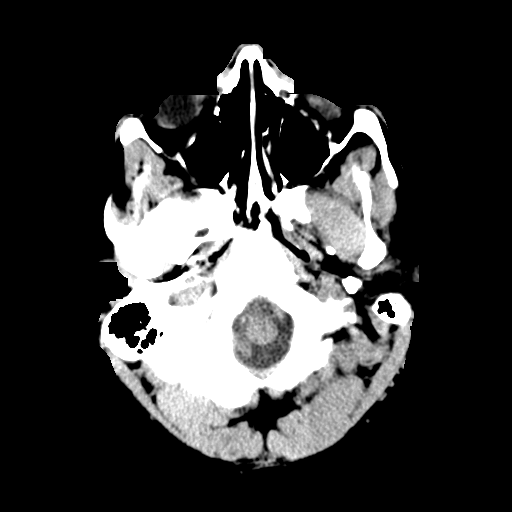
[im 3/32  bone]
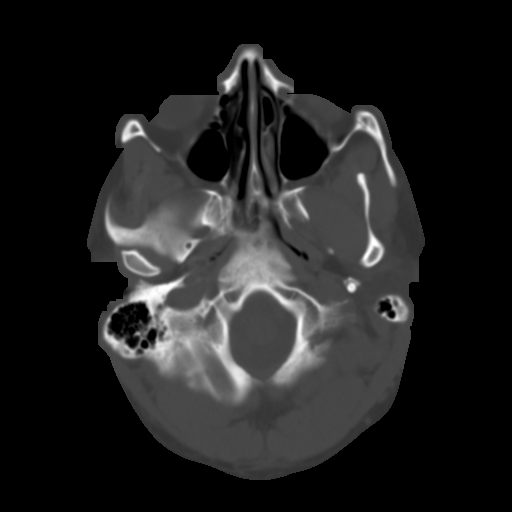
[im 6/32  brain]
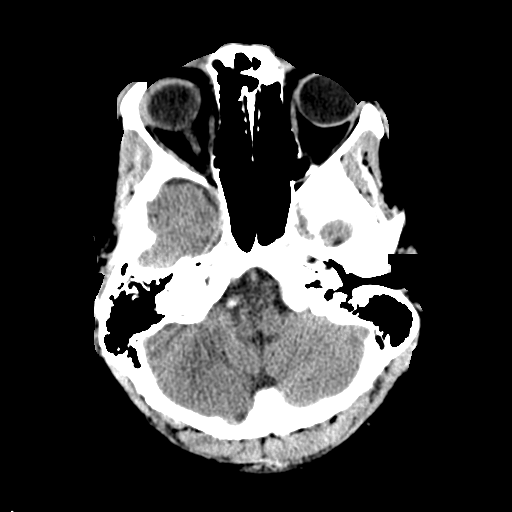
[im 9/32  brain]
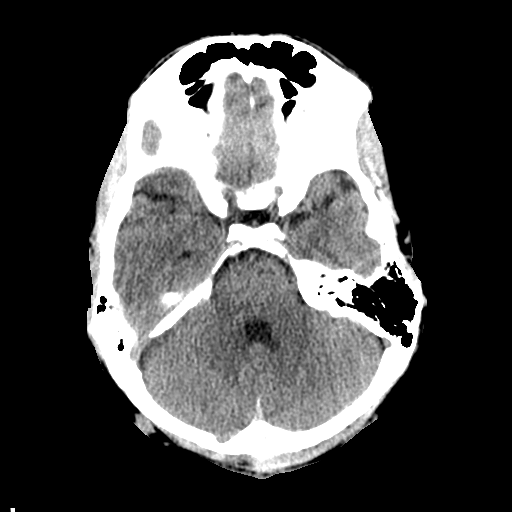
[im 11/32  brain]
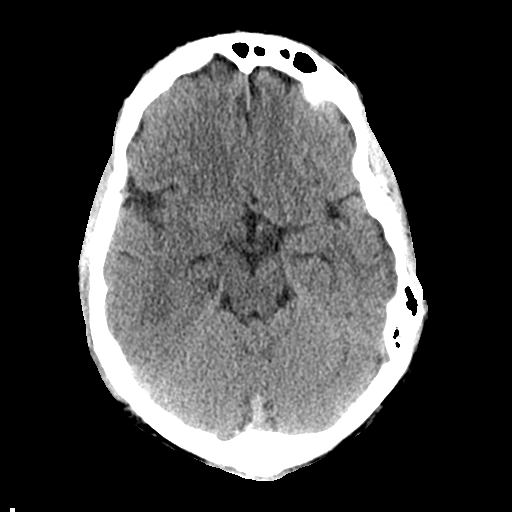
[im 14/32  brain]
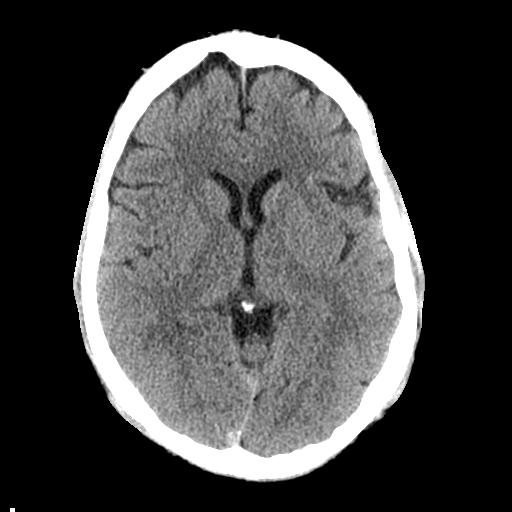
[im 14/32  bone]
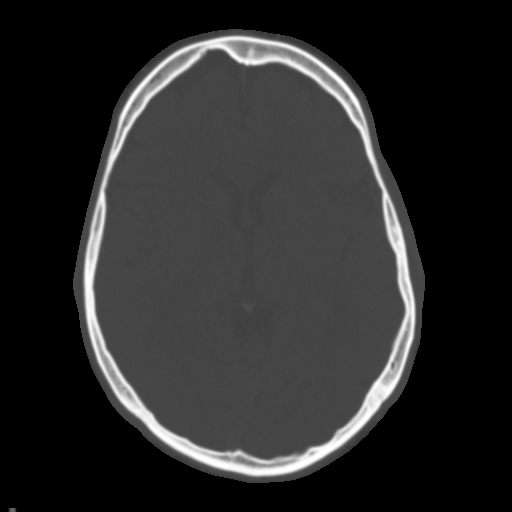
[im 18/32  brain]
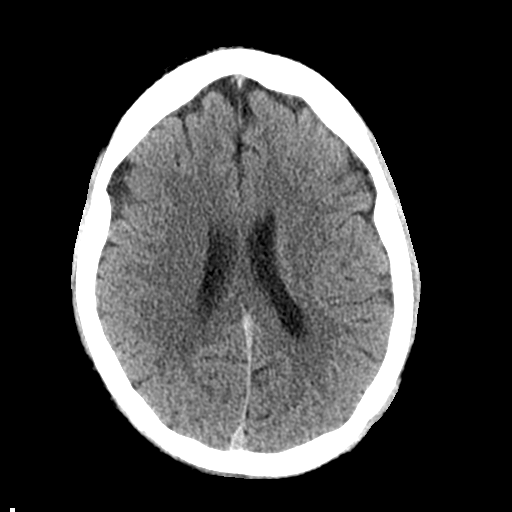
[im 21/32  brain]
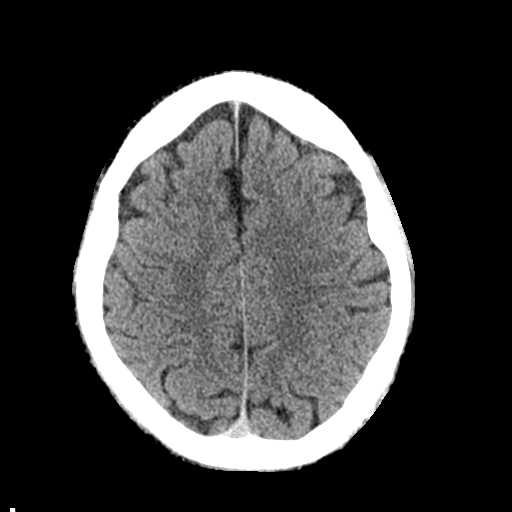
[im 24/32  brain]
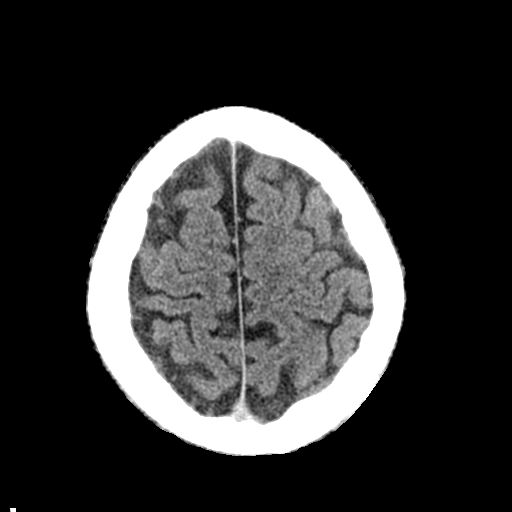
[im 26/32  brain]
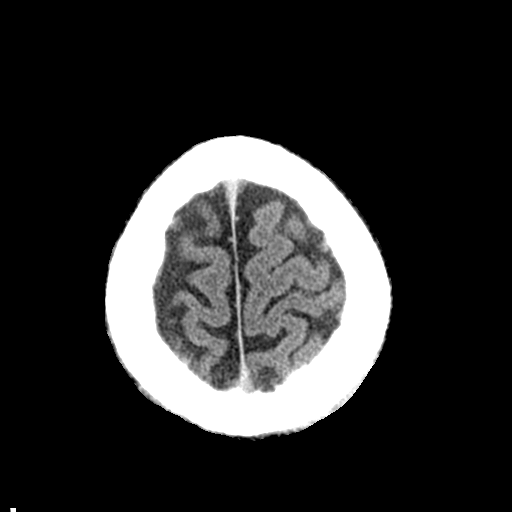
[im 26/32  bone]
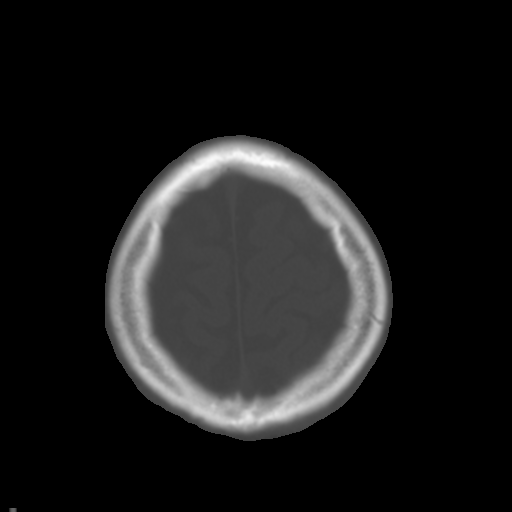
[im 29/32  brain]
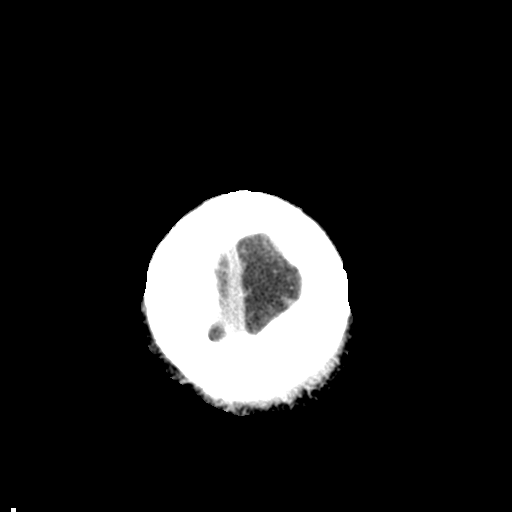

[Series 4: coronal soft · coronal · 0.38mm/px · 3 of 78 slices shown]
[im 26/78  brain]
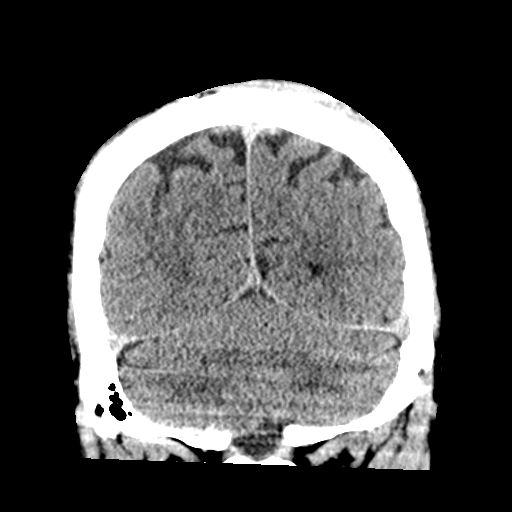
[im 35/78  brain]
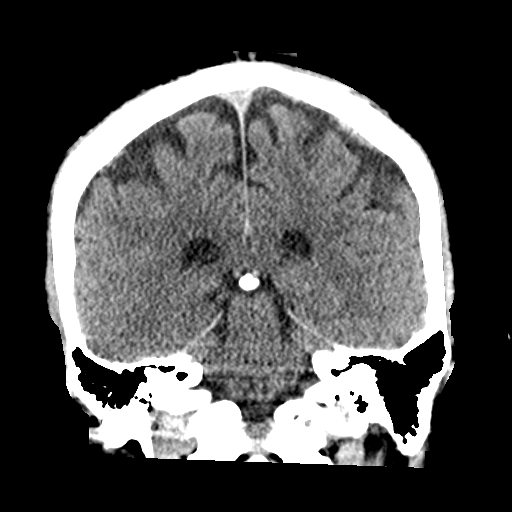
[im 43/78  brain]
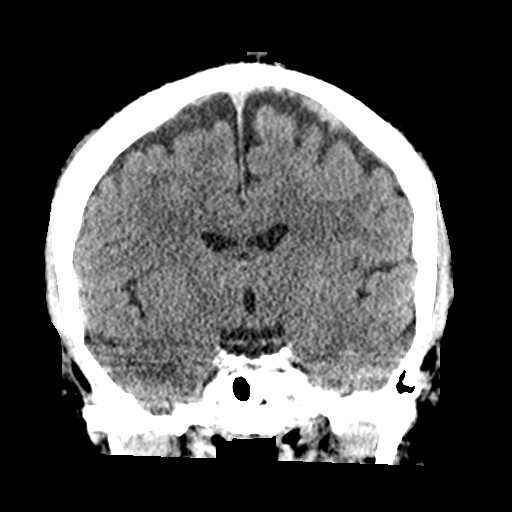

[Series 5: sagittal soft · sagittal · 0.36mm/px · 3 of 65 slices shown]
[im 22/65  brain]
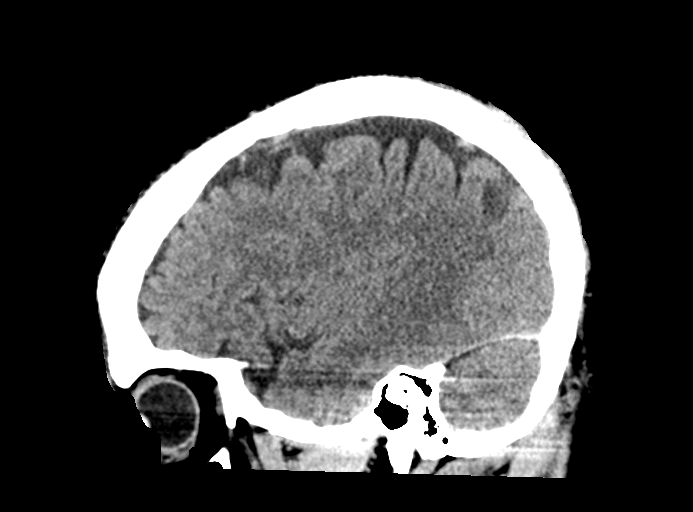
[im 33/65  brain]
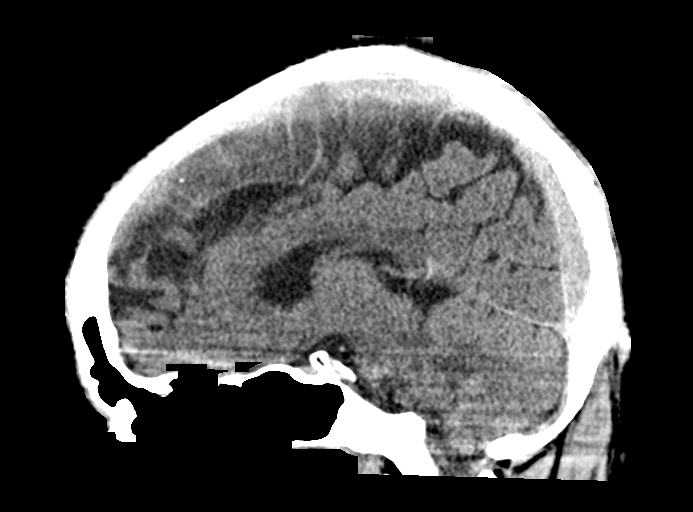
[im 43/65  brain]
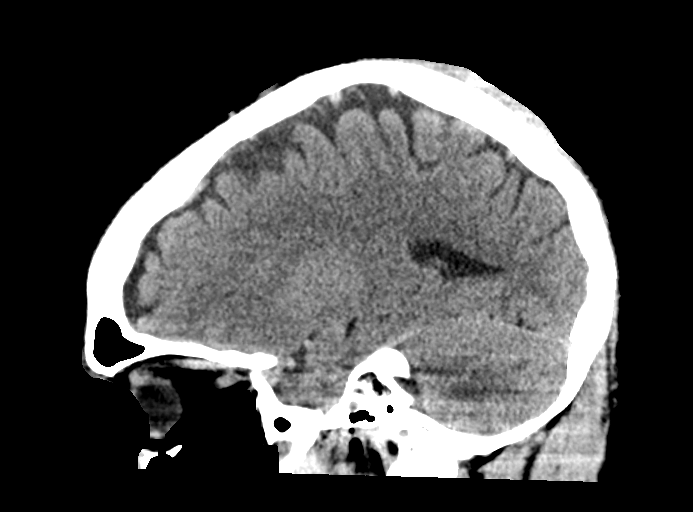

[16 of 47 positions shown; findings below may reference images not displayed]

FINDINGS: Brain: No evidence of acute infarction, hemorrhage, hydrocephalus,
extra-axial collection or mass lesion/mass effect.

Vascular: No hyperdense vessel or unexpected calcification.

Skull: Normal. Negative for fracture or focal lesion.

Sinuses/Orbits: No acute finding.

Other: Mild scalp soft tissue swelling is noted along the posterior
aspect of the vertex on the left.
IMPRESSION: No acute intracranial abnormality.

## 2021-07-15 IMAGING — MR MR HEAD W/O CM
12 of 13 series · 44 of 48 positions shown · non-contrast
Comparison: CT head [DATE]

CLINICAL DATA: Neuro deficit, acute, stroke suspected. L sided
paresthesias, now resolved.

EXAM:
MRI HEAD WITHOUT CONTRAST
TECHNIQUE: Multiplanar, multiecho pulse sequences of the brain and surrounding
structures were obtained without intravenous contrast.

[Series 5: DWI · axial · 3.0mm · 0.88mm/px · z∈[-100,+53]mm · 8 of 104 slices shown (1 of 4)]
[im 1/104]
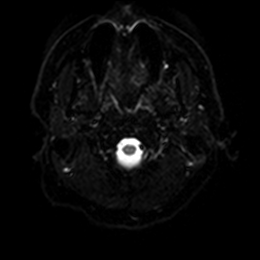
[im 15/104]
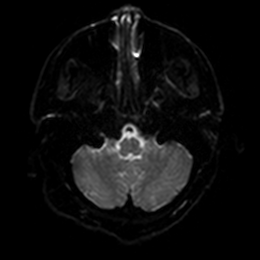
[im 30/104]
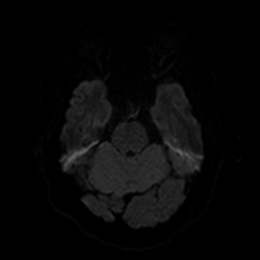
[im 45/104]
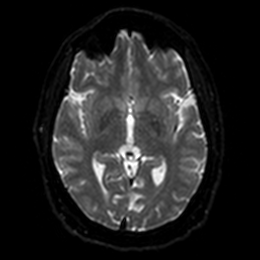
[im 59/104]
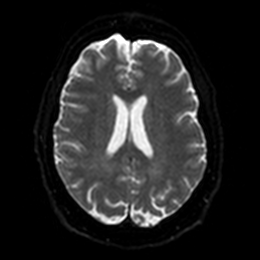
[im 74/104]
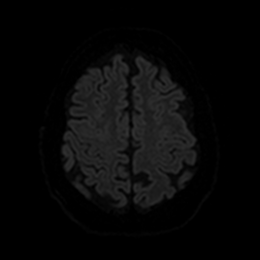
[im 89/104]
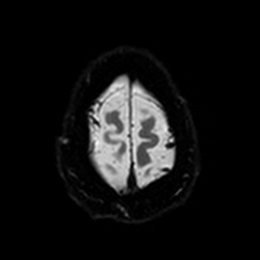
[im 104/104]
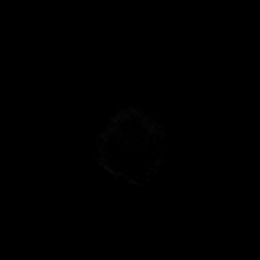

[Series 6: DWI · axial · 3.0mm · 0.88mm/px · z∈[-100,+53]mm · 4 of 52 slices shown (2 of 4)]
[im 1/52]
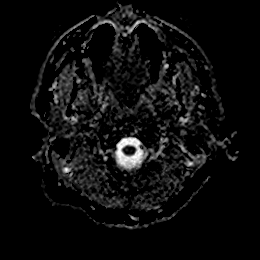
[im 18/52]
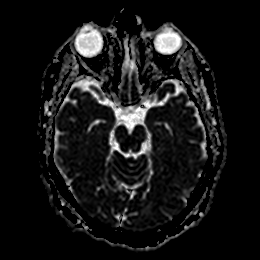
[im 35/52]
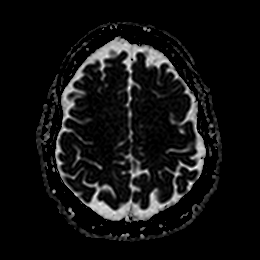
[im 52/52]
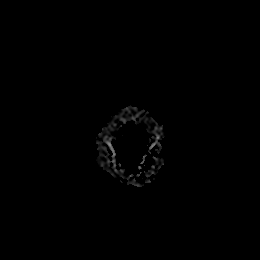

[Series 7: DWI · coronal · 4.0mm · 0.88mm/px · 5 of 76 slices shown (3 of 4)]
[im 1/76]
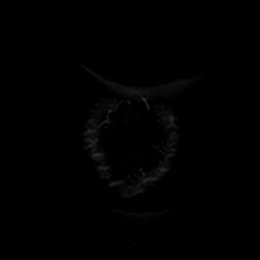
[im 19/76]
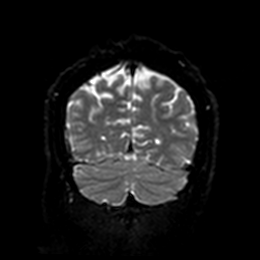
[im 38/76]
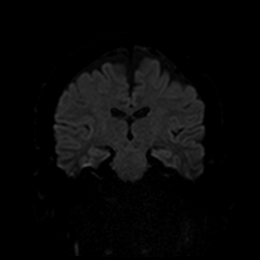
[im 57/76]
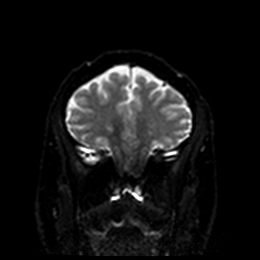
[im 76/76]
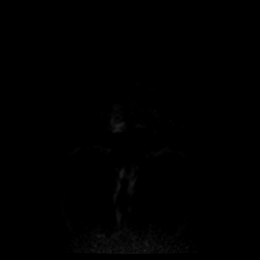

[Series 8: DWI · coronal · 4.0mm · 0.88mm/px · 3 of 38 slices shown (4 of 4)]
[im 1/38]
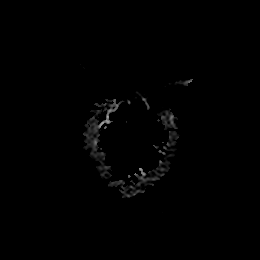
[im 19/38]
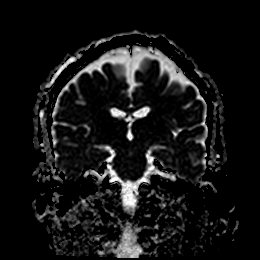
[im 38/38]
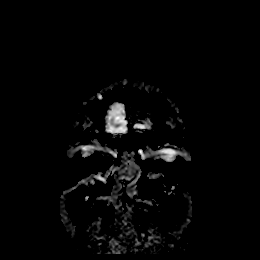

[Series 9: T1 · sagittal · 5.0mm · 0.75mm/px · 2 of 25 slices shown]
[im 1/25]
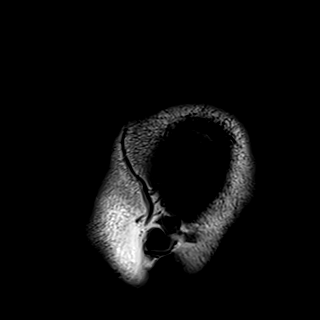
[im 25/25]
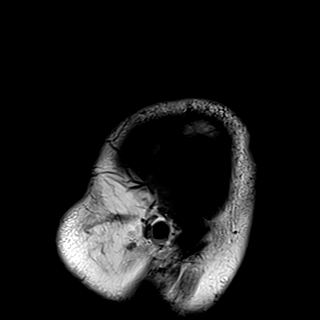

[Series 10: T2 · axial · 5.0mm · 0.72mm/px · z∈[-102,+54]mm · 2 of 27 slices shown (1 of 2)]
[im 1/27]
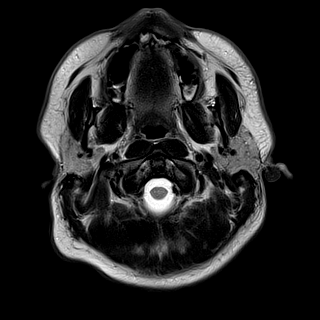
[im 27/27]
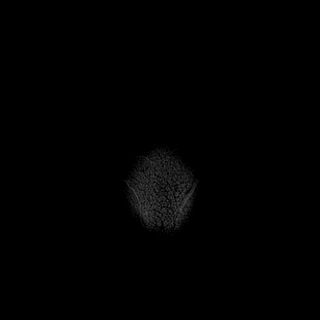

[Series 11: FLAIR · axial · 5.0mm · 0.45mm/px · z∈[-103,+53]mm · 2 of 27 slices shown]
[im 1/27]
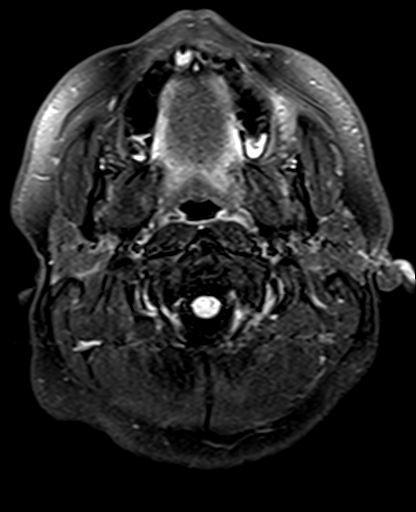
[im 27/27]
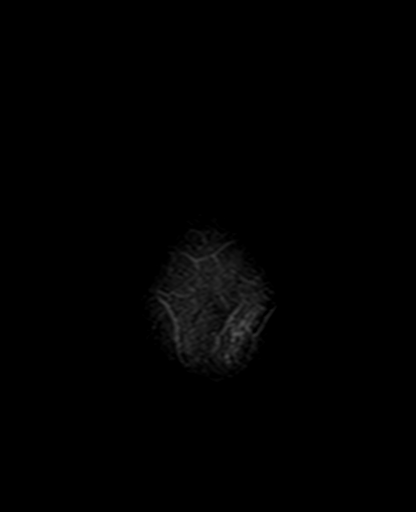

[Series 12: mag_images · axial · 3.0mm · 0.90mm/px · z∈[-113,+64]mm · 4 of 60 slices shown]
[im 1/60]
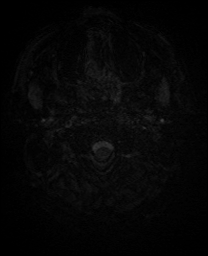
[im 20/60]
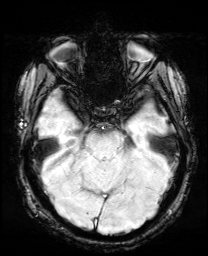
[im 40/60]
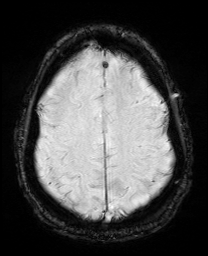
[im 60/60]
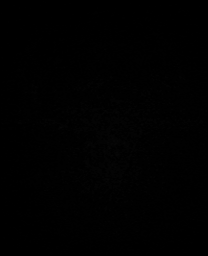

[Series 13: pha_images · axial · 3.0mm · 0.90mm/px · z∈[-113,+61]mm · 4 of 58 slices shown]
[im 1/58]
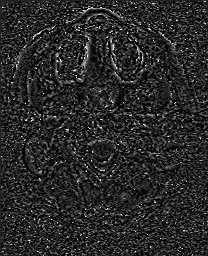
[im 20/58]
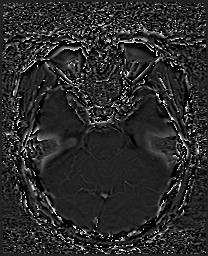
[im 39/58]
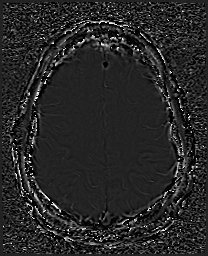
[im 58/58]
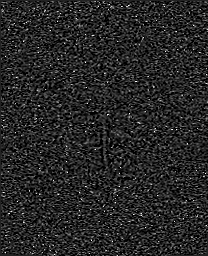

[Series 14: swi_images · axial · 3.0mm · 0.90mm/px · z∈[-113,+64]mm · 4 of 60 slices shown]
[im 1/60]
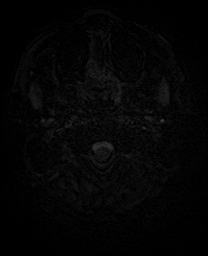
[im 20/60]
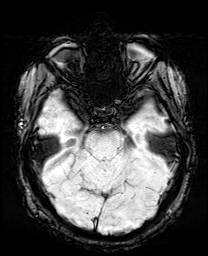
[im 40/60]
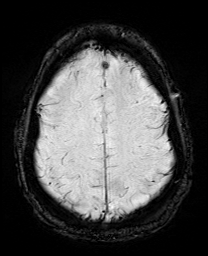
[im 60/60]
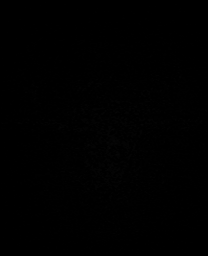

[Series 15: mip_images(sw) · axial · 24.0mm · 0.90mm/px · z∈[-102,+53]mm · 4 of 53 slices shown]
[im 1/53]
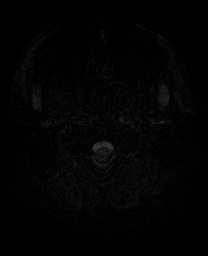
[im 18/53]
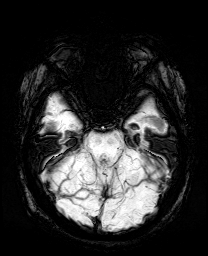
[im 35/53]
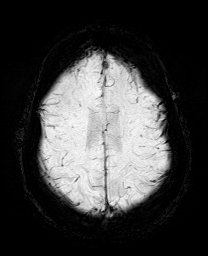
[im 53/53]
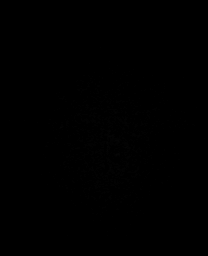

[Series 17: T2 · coronal · 5.0mm · 0.34mm/px · 2 of 32 slices shown (2 of 2)]
[im 1/32]
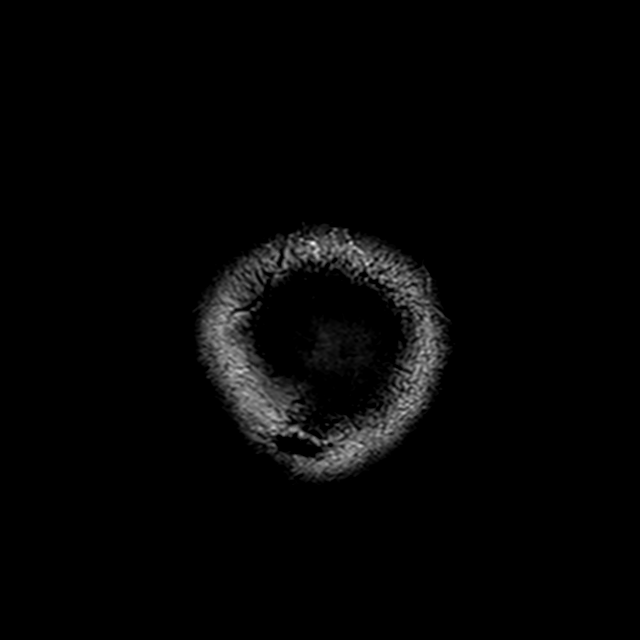
[im 32/32]
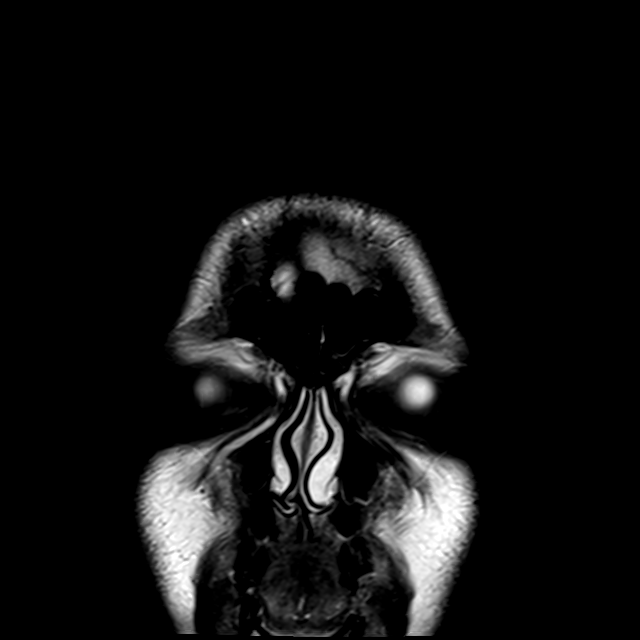

[44 of 48 positions shown; findings below may reference images not displayed]

FINDINGS: Brain: There is no evidence of an acute infarct, intracranial
hemorrhage, mass, midline shift, or extra-axial fluid collection.
The ventricles and sulci are normal. The brain is normal in signal.

Vascular: Major intracranial vascular flow voids are preserved.

Skull and upper cervical spine: Unremarkable bone marrow signal.

Sinuses/Orbits: Unremarkable orbits. No evidence of significant
inflammatory sinus disease or mastoid fluid.

Other: None.
IMPRESSION: Negative brain MRI.

## 2021-07-15 IMAGING — DX DG FOOT COMPLETE 3+V*R*
3 series · 3 of 3 positions shown · non-contrast
Comparison: [DATE].

CLINICAL DATA: Right foot pain and swelling after little toe
amputation.

EXAM:
RIGHT FOOT COMPLETE - 3+ VIEW

[foot ap]
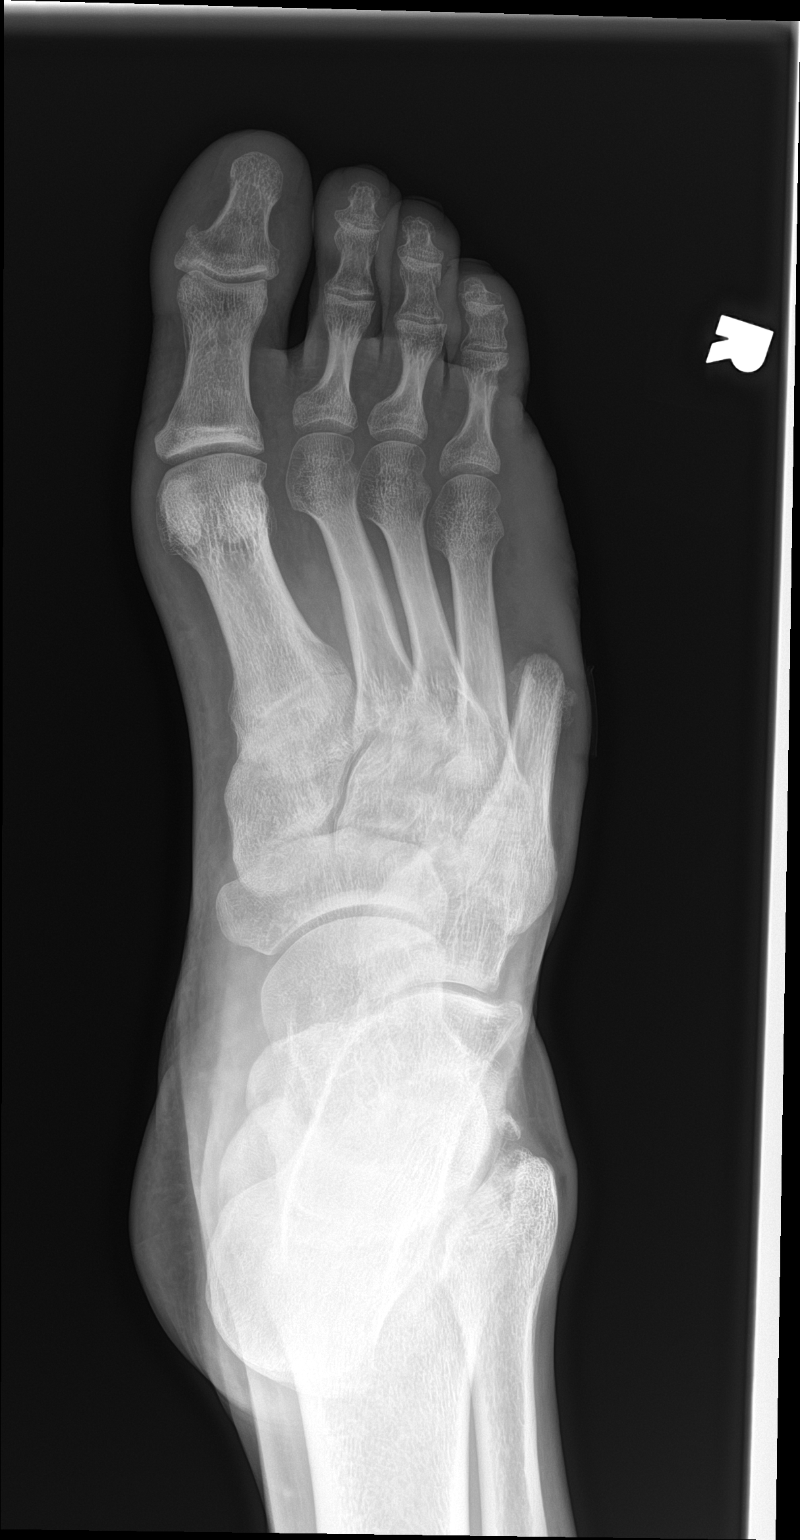

[foot obl]
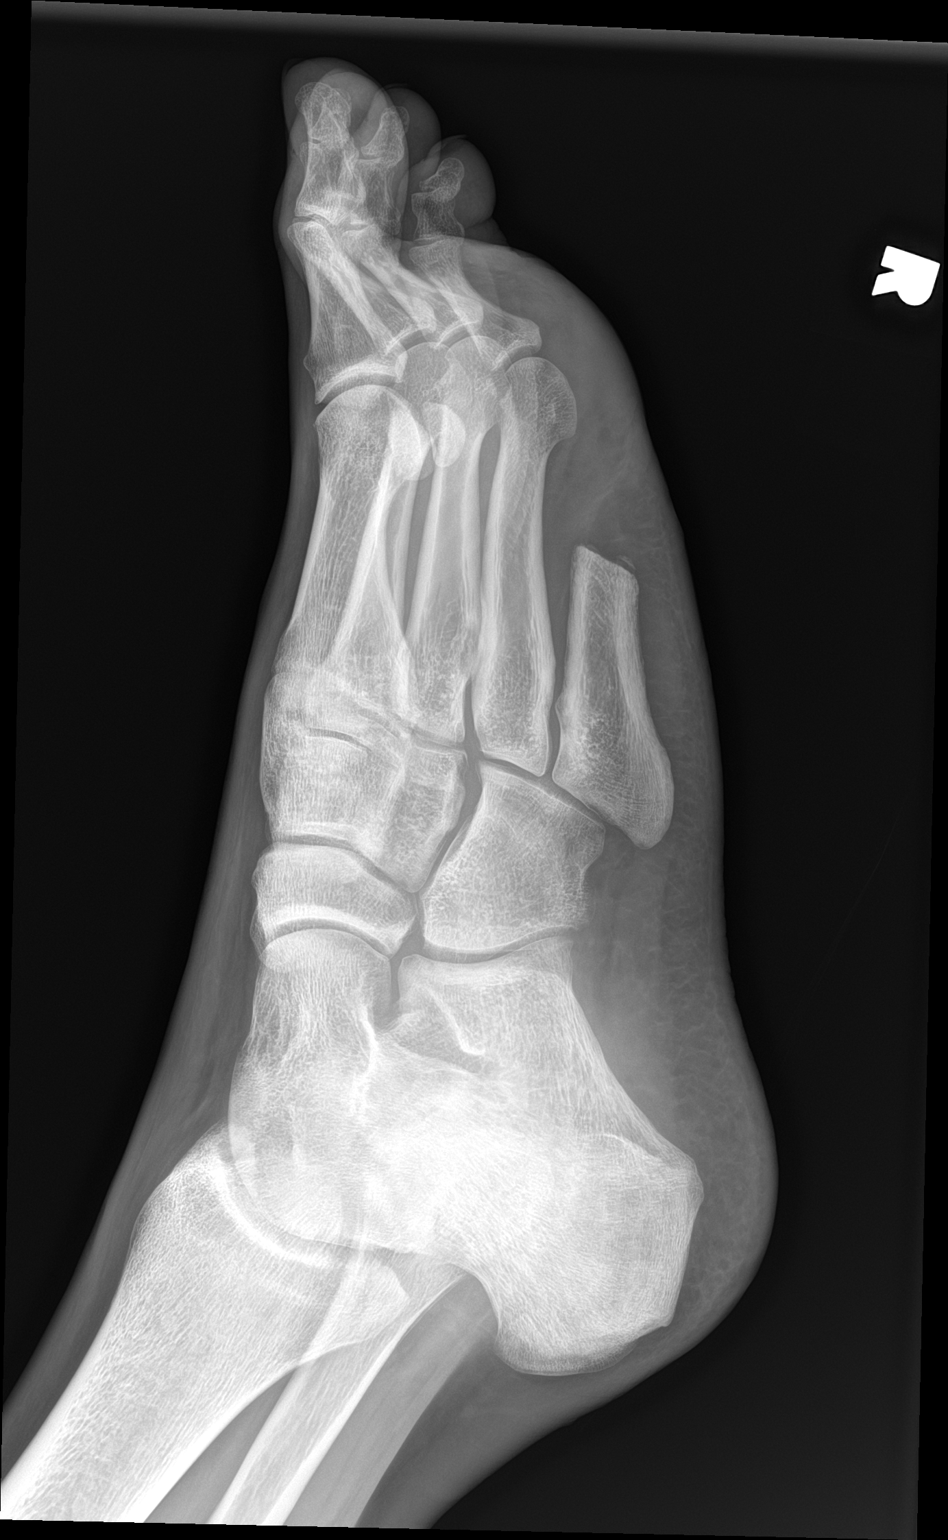

[foot lat]
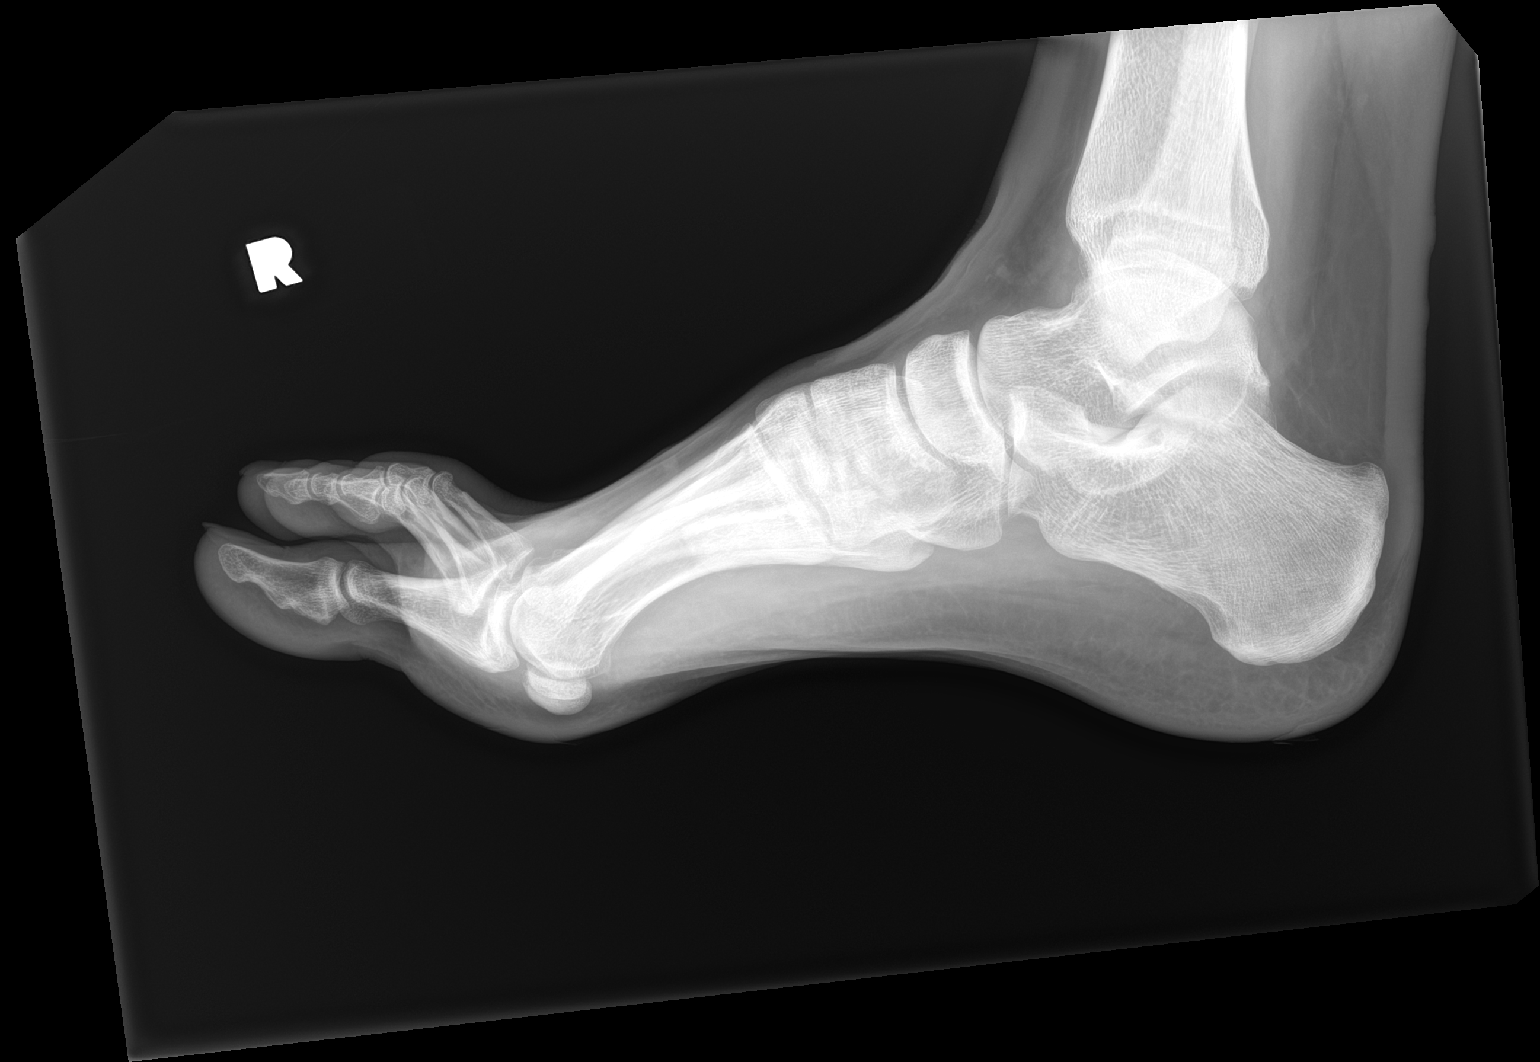

[3 of 3 positions shown; findings below may reference images not displayed]

FINDINGS: Status post little toe amputation at the level of the mid
metatarsal. Interval development of mild periosteal reaction at the
site of amputation. Lucency overlying the lateral cortex of the
fourth toe proximal phalangeal neck is similar to prior. No evidence
for soft tissue gas. No fracture or dislocation.
IMPRESSION: 1. Interval development of mild periosteal reaction at the site of
little toe amputation, compatible with healing.
2. No evidence for soft tissue gas.
3. Similar lucency in the neck of the fourth toe proximal phalanx.
This was evaluated by MRI on [DATE], demonstrating nonspecific
marrow signal abnormality at this location.

## 2021-07-15 MED ORDER — ASPIRIN 81 MG PO CHEW
324.0000 mg | CHEWABLE_TABLET | Freq: Once | ORAL | Status: AC
  Administered 2021-07-15: 324 mg via ORAL
  Filled 2021-07-15: qty 4

## 2021-07-15 NOTE — ED Notes (Signed)
Patient took his home medication of Coreg '25mg'$  and Imdur '30mg'$  per Dr Vevelyn Francois approval.

## 2021-07-15 NOTE — Discharge Instructions (Addendum)
Call your primary care doctor or specialist as discussed in the next 2-3 days.   Return immediately back to the ER if:  Your symptoms worsen within the next 12-24 hours. You develop new symptoms such as new fevers, persistent vomiting, new pain, shortness of breath, or new weakness or numbness, or if you have any other concerns.  

## 2021-07-15 NOTE — ED Provider Notes (Addendum)
[facility] EMERGENCY DEPARTMENT Provider Note   CSN: [id number] Arrival date & time: 07/14/21  2141     History Chief Complaint  Patient presents with   Anxiety    [name] is a 25 y.o. male.  Patient with a history of diabetes, recent admission for osteomyelitis of his foot no longer on antibiotics here with episode of tingling and numbness to his left side.  States he woke up from a nap approximately 9 PM.  He felt hot, sweaty, lightheaded and had several episodes of nausea and vomiting.  He had tingling and numbness to his left arm as well as his left leg.  He feels he was weak in the arm as well.  Symptoms are now gone.  Symptoms lasted approximately 4 hours.  States he felt numb and tingly from his mid forearm down to his hand and his mid left lower leg down to his foot.  No difficulty speaking or difficulty swallowing.  No chest pain or shortness of breath.  No headache or visual changes. Recently started on Effexor for anxiety.  Denies any alcohol or drug use.  The history is provided by the patient.  Anxiety Pertinent negatives include no chest pain, no abdominal pain, no headaches and no shortness of breath.      Past Medical History:  Diagnosis Date   Complication of anesthesia    Hard to wake up when he was younger once.   Diabetes mellitus, type II ([place])    Heart murmur    Hypertension     Patient Active Problem List   Diagnosis Date Noted   Diabetes mellitus due to underlying condition with hyperglycemia, with long-term current use of insulin ([place]) 06/20/2021   Medication monitoring encounter 06/20/2021   SIRS (systemic inflammatory response syndrome) ([place]) 06/07/2021   Malnutrition of moderate degree 05/24/2021   Planned postoperative wound closure    Acute osteomyelitis of metatarsal bone of right foot ([place]) 05/20/2021   HLD (hyperlipidemia) 05/20/2021   Anxiety 12/21/2020   Atypical chest pain 12/21/2020   Body mass index (BMI) 30.0-30.9, adult  12/21/2020   Chronic pain 12/21/2020   Disorder of teeth and supporting structures, unspecified 12/21/2020   Epigastric pain 12/21/2020   Gastroesophageal reflux disease 12/21/2020   Headache disorder 12/21/2020   Insomnia 12/21/2020   Leukocytosis 12/21/2020   Oral mucositis (ulcerative), unspecified 12/21/2020   Panic disorder 12/21/2020   Poor sleep pattern 12/21/2020   Sinus tachycardia 23/76/2831   Systolic murmur 51/76/1607   Uncontrolled type 1 diabetes mellitus with hyperglycemia ([place]) 11/26/2018   Personal history of noncompliance with medical treatment, presenting hazards to health 03/15/2017   HTN (hypertension) 03/15/2017   Low back pain 03/14/2017   Neck pain 03/14/2017   Uncontrolled diabetes mellitus with diabetic autonomic neuropathy, with long-term current use of insulin ([place]) 03/01/2017   Type 2 diabetes mellitus with hyperglycemia ([place]) 10/19/2015    Past Surgical History:  Procedure Laterality Date   AMPUTATION Right 05/21/2021   Procedure: AMPUTATION RAY 5th;  Surgeon: Trula Slade, DPM;  Location: [facility];  Service: Podiatry;  Laterality: Right;   PALATE / UVULA BIOPSY / EXCISION     "growth removed"   UPPER GASTROINTESTINAL ENDOSCOPY     Done in [place] - 2013 ish   WOUND DEBRIDEMENT Right 05/24/2021   Procedure: RIGHT FOOT WOUND DEBRIDEMENT AND CLOSURE;  Surgeon: Evelina Bucy, DPM;  Location: [facility];  Service: Podiatry;  Laterality: Right;       Family  History  Problem Relation Age of Onset   Diabetes Maternal Grandmother    Thyroid disease Maternal Grandmother    Hypertension Maternal Grandmother    Healthy Mother    Healthy Father    Healthy Sister    Healthy Sister     Social History   Tobacco Use   Smoking status: Passive Smoke Exposure - Never Smoker   Smokeless tobacco: Never  Vaping Use   Vaping Use: Never used  Substance Use Topics   Alcohol use: No   Drug use: No    Home Medications Prior to Admission medications    Medication Sig Start Date End Date Taking? Authorizing Provider  acetaminophen (TYLENOL) 500 MG tablet Take 1,000 mg by mouth every 6 (six) hours as needed for moderate pain or headache.    [provider]  aspirin EC 81 MG tablet Take 1 tablet (81 mg total) by mouth daily. 09/13/20   Verta Ellen., NP  atorvastatin (LIPITOR) 10 MG tablet Take 1 tablet (10 mg total) by mouth daily. 05/26/21 08/24/21  Rai, Vernelle Emerald, MD  blood glucose meter kit and supplies KIT Dispense based on patient and insurance preference. Use up to four times daily as directed. (FOR ICD-10 E11.65). 01/13/19   Cassandria Anger, MD  carvedilol (COREG) 25 MG tablet Take 1 tablet (25 mg total) by mouth 2 (two) times daily. 12/01/18   Herminio Commons, MD  daptomycin (CUBICIN) IVPB Inject 800 mg into the vein daily. Indication: Ostetmyelitis/septic arthritis First Dose: Yes Last Day of Therapy:  07/05/21 Labs - Once weekly:  CBC/D, BMP, and CPK Labs - Every other week:  ESR and CRP Method of administration: IV Push Method of administration may be changed at the discretion of home infusion pharmacist based upon assessment of the patient and/or caregiver's ability to self-administer the medication ordered. 06/08/21 07/18/21  Little Ishikawa, MD  hydrOXYzine (ATARAX/VISTARIL) 50 MG tablet Take 50 mg by mouth in the morning and at bedtime.    [provider]  ibuprofen (ADVIL) 800 MG tablet TAKE 1 TABLET BY MOUTH EVERY 8 HOURS AS NEEDED 05/10/21   Carole Civil, MD  insulin glargine (LANTUS SOLOSTAR) 100 UNIT/ML Solostar Pen Inject 35 Units into the skin 2 (two) times daily. 10/18/20   Philemon Kingdom, MD  insulin lispro (HUMALOG KWIKPEN) 200 UNIT/ML KwikPen Inject under skin 10-14 units 3x a day before meals 10/18/20   Philemon Kingdom, MD  Insulin Pen Needle (CARETOUCH PEN NEEDLES) 31G X 6 MM MISC Use 6x a day 10/18/20   Philemon Kingdom, MD  isosorbide mononitrate (IMDUR) 30 MG 24 hr tablet  Take 1 tablet (30 mg total) by mouth daily. 05/26/21 08/24/21  Rai, Vernelle Emerald, MD  Lancet Devices [facility]) lancets Use as instructed 4 x daily. e11.65 04/03/17   Cassandria Anger, MD  Lancets (ACCU-CHEK SOFT TOUCH) lancets Use as instructed 08/10/18   Isla Pence, MD  Multiple Vitamin (MULTIVITAMIN WITH MINERALS) TABS tablet Take 1 tablet by mouth daily. 06/09/21   Little Ishikawa, MD  Naphazoline HCl (CLEAR EYES OP) Place 1 drop into the left eye daily as needed (dry eyes).    [provider]  nitroGLYCERIN (NITROSTAT) 0.4 MG SL tablet Place 1 tablet (0.4 mg total) under the tongue every 5 (five) minutes as needed for chest pain. 09/13/20 06/07/21  Verta Ellen., NP  omeprazole (PRILOSEC) 20 MG capsule Take 20 mg by mouth daily.  10/16/18   [provider]  OVER THE COUNTER MEDICATION Place 4-5 drops into the left ear daily as needed (ringing in ear). Ear ringing medication    [provider]  OZEMPIC, 1 MG/DOSE, 4 MG/3ML SOPN Inject 1 mg into the skin once a week. 05/19/21   [provider]  silver sulfADIAZINE (SILVADENE) 1 % cream Apply pea-sized amount to wound daily. 06/20/21   Evelina Bucy, DPM  zolpidem (AMBIEN) 10 MG tablet Take 10 mg by mouth at bedtime. 08/17/20   [provider]    Allergies    Amoxicillin  Review of Systems   Review of Systems  Constitutional:  Negative for activity change, appetite change and fever.  HENT:  Negative for congestion and rhinorrhea.   Eyes:  Negative for photophobia.  Respiratory:  Negative for cough, chest tightness and shortness of breath.   Cardiovascular:  Negative for chest pain.  Gastrointestinal:  Negative for abdominal pain, nausea and vomiting.  Genitourinary:  Negative for dysuria and hematuria.  Musculoskeletal:  Negative for arthralgias and myalgias.  Skin:  Negative for rash.  Neurological:  Positive for light-headedness and numbness. Negative for dizziness,  weakness and headaches.   all other systems are negative except as noted in the HPI and PMH.   Physical Exam Updated Vital Signs BP (!) 143/97   Pulse (!) 113   Temp 98.8 F (37.1 C)   Resp 18   Ht 6' 3"  (1.905 m)   Wt 95.3 kg   SpO2 98%   BMI 26.25 kg/m   Physical Exam Vitals and nursing note reviewed.  Constitutional:      General: He is not in acute distress.    Appearance: He is well-developed.  HENT:     Head: Normocephalic and atraumatic.     Mouth/Throat:     Pharynx: No oropharyngeal exudate.  Eyes:     Conjunctiva/sclera: Conjunctivae normal.     Pupils: Pupils are equal, round, and reactive to light.  Neck:     Comments: No meningismus. Cardiovascular:     Rate and Rhythm: Normal rate and regular rhythm.     Heart sounds: Normal heart sounds. No murmur heard. Pulmonary:     Effort: Pulmonary effort is normal. No respiratory distress.     Breath sounds: Normal breath sounds.  Abdominal:     Palpations: Abdomen is soft.     Tenderness: There is no abdominal tenderness. There is no guarding or rebound.  Musculoskeletal:        General: No tenderness. Normal range of motion.     Cervical back: Normal range of motion and neck supple.     Comments: Right postop boot in place  Healing fifth ray amputation site to right foot.  There is a clean-based ulcer to the plantar surface with some clear drainage.  No surrounding erythema or fluctuance  Skin:    General: Skin is warm.  Neurological:     Mental Status: He is alert and oriented to person, place, and time.     Cranial Nerves: No cranial nerve deficit.     Motor: No abnormal muscle tone.     Coordination: Coordination normal.     Comments: CN 2-12 intact, no ataxia on finger to nose, no nystagmus, 5/5 strength throughout, no pronator drift, Romberg negative, normal gait.  Equal strength and sensation bilateral  Psychiatric:        Behavior: Behavior normal.    ED Results / Procedures / Treatments    Labs (all labs ordered are listed, but  only abnormal results are displayed) Labs Reviewed  CBC WITH DIFFERENTIAL/PLATELET - Abnormal; Notable for the following components:      Result Value   WBC 18.9 (*)    Neutro Abs 15.4 (*)    Abs Immature Granulocytes 0.15 (*)    All other components within normal limits  COMPREHENSIVE METABOLIC PANEL - Abnormal; Notable for the following components:   Total Protein 9.1 (*)    All other components within normal limits  URINALYSIS, ROUTINE W REFLEX MICROSCOPIC - Abnormal; Notable for the following components:   Protein, ur 30 (*)    All other components within normal limits  RAPID URINE DRUG SCREEN, HOSP PERFORMED  ETHANOL  PROTIME-INR  APTT  TROPONIN I (HIGH SENSITIVITY)  TROPONIN I (HIGH SENSITIVITY)    EKG EKG Interpretation  Date/Time:  Friday July 14 2021 23:21:09 EDT Ventricular Rate:  99 PR Interval:  162 QRS Duration: 96 QT Interval:  352 QTC Calculation: 451 R Axis:   14 Text Interpretation: Normal sinus rhythm Septal infarct , age undetermined Abnormal ECG No significant change was found Confirmed by Ezequiel Essex [phone]) on 07/14/2021 11:26:01 PM  Radiology CT HEAD WO CONTRAST  Result Date: 07/15/2021 CLINICAL DATA:  Left arm tingling. EXAM: CT HEAD WITHOUT CONTRAST TECHNIQUE: Contiguous axial images were obtained from the base of the skull through the vertex without intravenous contrast. COMPARISON:  None. FINDINGS: Brain: No evidence of acute infarction, hemorrhage, hydrocephalus, extra-axial collection or mass lesion/mass effect. Vascular: No hyperdense vessel or unexpected calcification. Skull: Normal. Negative for fracture or focal lesion. Sinuses/Orbits: No acute finding. Other: Mild scalp soft tissue swelling is noted along the posterior aspect of the vertex on the left. IMPRESSION: No acute intracranial abnormality. Electronically Signed   By: Virgina Norfolk M.D.   On: 07/15/2021 04:15   DG Foot Complete  Right  Result Date: 07/15/2021 CLINICAL DATA:  Right foot pain and swelling after little toe amputation. EXAM: RIGHT FOOT COMPLETE - 3+ VIEW COMPARISON:  05/21/2021. FINDINGS: Status post little toe amputation at the level of the mid metatarsal. Interval development of mild periosteal reaction at the site of amputation. Lucency overlying the lateral cortex of the fourth toe proximal phalangeal neck is similar to prior. No evidence for soft tissue gas. No fracture or dislocation. IMPRESSION: 1. Interval development of mild periosteal reaction at the site of little toe amputation, compatible with healing. 2. No evidence for soft tissue gas. 3. Similar lucency in the neck of the fourth toe proximal phalanx. This was evaluated by MRI on 06/07/2021, demonstrating nonspecific marrow signal abnormality at this location. Electronically Signed   By: Misty Stanley M.D.   On: 07/15/2021 06:47    Procedures Procedures   Medications Ordered in ED Medications - No data to display  ED Course  I have reviewed the triage vital signs and the nursing notes.  Pertinent labs & imaging results that were available during my care of the patient were reviewed by me and considered in my medical decision making (see chart for details).    MDM Rules/Calculators/A&P                           Left-sided numbness, now resolved, no focal weakness  Code stroke not activated due to resolution of symptoms and low NIH scale  Labs show leukocytosis of 18.  Patient with recent admission for osteomyelitis but completed antibiotics. He states he always has an elevated white blood cell count.  CT  head is negative.  Low suspicion for CVA or TIA.  Patient hesitant to be transferred for MRI which is not available at this facility.  D/w Dr. Reesa Chew of neurology who does recommend MRI and further TIA work-up if positive  Neurology recommendations discussed with patient.  He is agreeable to transfer for MRI. Can be discharged for  outpatient neurology follow-up if negative  D/w Dr. Christy Gentles.  Final Clinical Impression(s) / ED Diagnoses Final diagnoses:  Left sided numbness    Rx / DC Orders ED Discharge Orders     None        Chalee Hirota, Annie Main, MD 07/15/21 [id number]    Ezequiel Essex, MD 07/15/21 [phone]

## 2021-07-15 NOTE — ED Notes (Signed)
Pt arrives via [facility] for MRI. No complaints at this time.

## 2021-07-15 NOTE — ED Provider Notes (Signed)
[facility] EMERGENCY DEPARTMENT Provider Note   CSN: [id number] Arrival date & time: 07/14/21  2141     History Chief Complaint  Patient presents with   Anxiety    [name] is a 25 y.o. male.  Patient transferred from outside facility for MRI evaluation.  He has a history of diabetes and osteomyelitis and had finished       Past Medical History:  Diagnosis Date   Complication of anesthesia    Hard to wake up when he was younger once.   Diabetes mellitus, type II ([place])    Heart murmur    Hypertension     Patient Active Problem List   Diagnosis Date Noted   Diabetes mellitus due to underlying condition with hyperglycemia, with long-term current use of insulin ([place]) 06/20/2021   Medication monitoring encounter 06/20/2021   SIRS (systemic inflammatory response syndrome) ([place]) 06/07/2021   Malnutrition of moderate degree 05/24/2021   Planned postoperative wound closure    Acute osteomyelitis of metatarsal bone of right foot ([place]) 05/20/2021   HLD (hyperlipidemia) 05/20/2021   Anxiety 12/21/2020   Atypical chest pain 12/21/2020   Body mass index (BMI) 30.0-30.9, adult 12/21/2020   Chronic pain 12/21/2020   Disorder of teeth and supporting structures, unspecified 12/21/2020   Epigastric pain 12/21/2020   Gastroesophageal reflux disease 12/21/2020   Headache disorder 12/21/2020   Insomnia 12/21/2020   Leukocytosis 12/21/2020   Oral mucositis (ulcerative), unspecified 12/21/2020   Panic disorder 12/21/2020   Poor sleep pattern 12/21/2020   Sinus tachycardia 14/97/0263   Systolic murmur 78/58/8502   Uncontrolled type 1 diabetes mellitus with hyperglycemia ([place]) 11/26/2018   Personal history of noncompliance with medical treatment, presenting hazards to health 03/15/2017   HTN (hypertension) 03/15/2017   Low back pain 03/14/2017   Neck pain 03/14/2017   Uncontrolled diabetes mellitus with diabetic autonomic neuropathy, with long-term current  use of insulin ([place]) 03/01/2017   Type 2 diabetes mellitus with hyperglycemia ([place]) 10/19/2015    Past Surgical History:  Procedure Laterality Date   AMPUTATION Right 05/21/2021   Procedure: AMPUTATION RAY 5th;  Surgeon: Trula Slade, DPM;  Location: [place];  Service: Podiatry;  Laterality: Right;   PALATE / UVULA BIOPSY / EXCISION     "growth removed"   UPPER GASTROINTESTINAL ENDOSCOPY     Done in [place] - 2013 ish   WOUND DEBRIDEMENT Right 05/24/2021   Procedure: RIGHT FOOT WOUND DEBRIDEMENT AND CLOSURE;  Surgeon: Evelina Bucy, DPM;  Location: [place];  Service: Podiatry;  Laterality: Right;       Family History  Problem Relation Age of Onset   Diabetes Maternal Grandmother    Thyroid disease Maternal Grandmother    Hypertension Maternal Grandmother    Healthy Mother    Healthy Father    Healthy Sister    Healthy Sister     Social History   Tobacco Use   Smoking status: Passive Smoke Exposure - Never Smoker   Smokeless tobacco: Never  Vaping Use   Vaping Use: Never used  Substance Use Topics   Alcohol use: No   Drug use: No    Home Medications Prior to Admission medications   Medication Sig Start Date End Date Taking? Authorizing Provider  acetaminophen (TYLENOL) 500 MG tablet Take 1,000 mg by mouth every 6 (six) hours as needed for moderate pain or headache.    [provider]  aspirin EC 81 MG tablet Take 1 tablet (81 mg total)  by mouth daily. 09/13/20   Verta Ellen., NP  atorvastatin (LIPITOR) 10 MG tablet Take 1 tablet (10 mg total) by mouth daily. 05/26/21 08/24/21  Rai, Vernelle Emerald, MD  blood glucose meter kit and supplies KIT Dispense based on patient and insurance preference. Use up to four times daily as directed. (FOR ICD-10 E11.65). 01/13/19   Cassandria Anger, MD  carvedilol (COREG) 25 MG tablet Take 1 tablet (25 mg total) by mouth 2 (two) times daily. 12/01/18   Herminio Commons, MD  daptomycin (CUBICIN) IVPB Inject 800 mg into  the vein daily. Indication: Ostetmyelitis/septic arthritis First Dose: Yes Last Day of Therapy:  07/05/21 Labs - Once weekly:  CBC/D, BMP, and CPK Labs - Every other week:  ESR and CRP Method of administration: IV Push Method of administration may be changed at the discretion of home infusion pharmacist based upon assessment of the patient and/or caregiver's ability to self-administer the medication ordered. 06/08/21 07/18/21  Little Ishikawa, MD  hydrOXYzine (ATARAX/VISTARIL) 50 MG tablet Take 50 mg by mouth in the morning and at bedtime.    [provider]  ibuprofen (ADVIL) 800 MG tablet TAKE 1 TABLET BY MOUTH EVERY 8 HOURS AS NEEDED 05/10/21   Carole Civil, MD  insulin glargine (LANTUS SOLOSTAR) 100 UNIT/ML Solostar Pen Inject 35 Units into the skin 2 (two) times daily. 10/18/20   Philemon Kingdom, MD  insulin lispro (HUMALOG KWIKPEN) 200 UNIT/ML KwikPen Inject under skin 10-14 units 3x a day before meals 10/18/20   Philemon Kingdom, MD  Insulin Pen Needle (CARETOUCH PEN NEEDLES) 31G X 6 MM MISC Use 6x a day 10/18/20   Philemon Kingdom, MD  isosorbide mononitrate (IMDUR) 30 MG 24 hr tablet Take 1 tablet (30 mg total) by mouth daily. 05/26/21 08/24/21  Rai, Vernelle Emerald, MD  Lancet [facility]) lancets Use as instructed 4 x daily. e11.65 04/03/17   Cassandria Anger, MD  Lancets (ACCU-CHEK SOFT TOUCH) lancets Use as instructed 08/10/18   Isla Pence, MD  Multiple Vitamin (MULTIVITAMIN WITH MINERALS) TABS tablet Take 1 tablet by mouth daily. 06/09/21   Little Ishikawa, MD  Naphazoline HCl (CLEAR EYES OP) Place 1 drop into the left eye daily as needed (dry eyes).    [provider]  nitroGLYCERIN (NITROSTAT) 0.4 MG SL tablet Place 1 tablet (0.4 mg total) under the tongue every 5 (five) minutes as needed for chest pain. 09/13/20 06/07/21  Verta Ellen., NP  omeprazole (PRILOSEC) 20 MG capsule Take 20 mg by mouth daily.  10/16/18   [provider]  OVER THE COUNTER MEDICATION Place 4-5 drops into the left ear daily as needed (ringing in ear). Ear ringing medication    [provider]  OZEMPIC, 1 MG/DOSE, 4 MG/3ML SOPN Inject 1 mg into the skin once a week. 05/19/21   [provider]  silver sulfADIAZINE (SILVADENE) 1 % cream Apply pea-sized amount to wound daily. 06/20/21   Evelina Bucy, DPM  zolpidem (AMBIEN) 10 MG tablet Take 10 mg by mouth at bedtime. 08/17/20   [provider]    Allergies    Amoxicillin  Review of Systems   Review of Systems  Constitutional:  Negative for fever.  HENT:  Negative for ear pain and sore throat.   Eyes:  Negative for pain.  Respiratory:  Negative for cough.   Cardiovascular:  Negative for chest pain.  Gastrointestinal:  Negative for abdominal pain.  Genitourinary:  Negative  for flank pain.  Musculoskeletal:  Negative for back pain.  Skin:  Negative for color change and rash.  Neurological:  Negative for syncope.  All other systems reviewed and are negative.  Physical Exam Updated Vital Signs BP 115/77   Pulse 97   Temp 98.4 F (36.9 C) (Oral)   Resp 17   Ht _0  (1.905 m)   Wt 95.3 kg   SpO2 97%   BMI 26.25 kg/m   Physical Exam Constitutional:      General: He is not in acute distress.    Appearance: He is well-developed.  HENT:     Head: Normocephalic.     Nose: Nose normal.  Eyes:     Extraocular Movements: Extraocular movements intact.  Cardiovascular:     Rate and Rhythm: Normal rate.  Pulmonary:     Effort: Pulmonary effort is normal.  Skin:    Coloration: Skin is not jaundiced.  Neurological:     General: No focal deficit present.     Mental Status: He is alert and oriented to person, place, and time. Mental status is at baseline.    ED Results / Procedures / Treatments   Labs (all labs ordered are listed, but only abnormal results are displayed) Labs Reviewed  CBC WITH DIFFERENTIAL/PLATELET - Abnormal; Notable for  the following components:      Result Value   WBC 18.9 (*)    Neutro Abs 15.4 (*)    Abs Immature Granulocytes 0.15 (*)    All other components within normal limits  COMPREHENSIVE METABOLIC PANEL - Abnormal; Notable for the following components:   Total Protein 9.1 (*)    All other components within normal limits  URINALYSIS, ROUTINE W REFLEX MICROSCOPIC - Abnormal; Notable for the following components:   Protein, ur 30 (*)    All other components within normal limits  RESP PANEL BY RT-PCR (FLU A&B, COVID) ARPGX2  RAPID URINE DRUG SCREEN, HOSP PERFORMED  ETHANOL  PROTIME-INR  APTT  TROPONIN I (HIGH SENSITIVITY)  TROPONIN I (HIGH SENSITIVITY)    EKG EKG Interpretation  Date/Time:  Friday July 14 2021 23:21:09 EDT Ventricular Rate:  99 PR Interval:  162 QRS Duration: 96 QT Interval:  352 QTC Calculation: 451 R Axis:   14 Text Interpretation: Normal sinus rhythm Septal infarct , age undetermined Abnormal ECG No significant change was found Confirmed by Ezequiel Essex [phone]) on 07/14/2021 11:26:01 PM  Radiology CT HEAD WO CONTRAST  Result Date: 07/15/2021 CLINICAL DATA:  Left arm tingling. EXAM: CT HEAD WITHOUT CONTRAST TECHNIQUE: Contiguous axial images were obtained from the base of the skull through the vertex without intravenous contrast. COMPARISON:  None. FINDINGS: Brain: No evidence of acute infarction, hemorrhage, hydrocephalus, extra-axial collection or mass lesion/mass effect. Vascular: No hyperdense vessel or unexpected calcification. Skull: Normal. Negative for fracture or focal lesion. Sinuses/Orbits: No acute finding. Other: Mild scalp soft tissue swelling is noted along the posterior aspect of the vertex on the left. IMPRESSION: No acute intracranial abnormality. Electronically Signed   By: Virgina Norfolk M.D.   On: 07/15/2021 04:15   MR BRAIN WO CONTRAST  Result Date: 07/15/2021 CLINICAL DATA:  Neuro deficit, acute, stroke suspected. L sided paresthesias, now  resolved. EXAM: MRI HEAD WITHOUT CONTRAST TECHNIQUE: Multiplanar, multiecho pulse sequences of the brain and surrounding structures were obtained without intravenous contrast. COMPARISON:  CT head 07/15/2021 FINDINGS: Brain: There is no evidence of an acute infarct, intracranial hemorrhage, mass, midline shift, or extra-axial fluid collection. The ventricles and  sulci are normal. The brain is normal in signal. Vascular: Major intracranial vascular flow voids are preserved. Skull and upper cervical spine: Unremarkable bone marrow signal. Sinuses/Orbits: Unremarkable orbits. No evidence of significant inflammatory sinus disease or mastoid fluid. Other: None. IMPRESSION: Negative brain MRI. Electronically Signed   By: Logan Bores M.D.   On: 07/15/2021 12:52   DG Foot Complete Right  Result Date: 07/15/2021 CLINICAL DATA:  Right foot pain and swelling after little toe amputation. EXAM: RIGHT FOOT COMPLETE - 3+ VIEW COMPARISON:  05/21/2021. FINDINGS: Status post little toe amputation at the level of the mid metatarsal. Interval development of mild periosteal reaction at the site of amputation. Lucency overlying the lateral cortex of the fourth toe proximal phalangeal neck is similar to prior. No evidence for soft tissue gas. No fracture or dislocation. IMPRESSION: 1. Interval development of mild periosteal reaction at the site of little toe amputation, compatible with healing. 2. No evidence for soft tissue gas. 3. Similar lucency in the neck of the fourth toe proximal phalanx. This was evaluated by MRI on 06/07/2021, demonstrating nonspecific marrow signal abnormality at this location. Electronically Signed   By: Misty Stanley M.D.   On: 07/15/2021 06:47    Procedures Procedures   Medications Ordered in ED Medications  aspirin chewable tablet 324 mg (324 mg Oral Given 07/15/21 0510)    ED Course  I have reviewed the triage vital signs and the nursing notes.  Pertinent labs & imaging results that were  available during my care of the patient were reviewed by me and considered in my medical decision making (see chart for details).    MDM Rules/Calculators/A&P                           MRI is negative for any acute event.  Leukocytosis of 18.9 noted, the patient states he oftentimes gets elevated white counts.  No reports of fevers or pain or new symptoms at this time.  Commending close monitoring by his primary care doctors regarding this lab abnormality.  Advised patient to call his doctor within the next 3 to 4 days.  Patient discharged home to follow-up with neurology on an outpatient basis.  Advising immediate return for worsening pain fevers or any additional concerns.   Final Clinical Impression(s) / ED Diagnoses Final diagnoses:  Left sided numbness  Leukocytosis, unspecified type    Rx / DC Orders ED Discharge Orders     None        Luna Fuse, MD 07/15/21 1441

## 2021-07-15 NOTE — ED Notes (Signed)
Pt not yet in room.

## 2021-07-15 NOTE — ED Notes (Signed)
Pt to room 2.

## 2021-07-15 NOTE — Consult Note (Signed)
[facility] Tele[facility]urology Consult Services  Stat Consult  Date of Service:   07/15/2021 04:41:02  Diagnosis:       G45.9 - Transient cerebral ischemic attack, unspecified  Impression: 25 yr old man hx of DM, HTN, heart murmur, CAD, anxiety, presents to ER with L sided numbness, arm, and leg. He reports going to bed around 1800 - 1900 last evening, and at that time, he had no new symptoms. He woke up around 2100 with L arm, leg numbness, tingling, and some L arm heaviness. Symptoms lasted about 2-4 hrs per pt, resolved. At this time, he feels back to baseline.  NIH 0, CT head no acute changes.   Diff Dx: TIA, r/o CVA. Out of IV thrombolytic window, clinically not LVO.   Rec:  - TIA work up  - ASA 81 mg  - MRI brain, if shows CVA, then he would need full CVA work up with MRA brain, neck, echo.  - Neurology follow up  d/w pt.  Dw ER Dr Wyvonnia Dusky the above recs.  CT HEAD: Showed No Acute Hemorrhage or Acute Core Infarct  Our recommendations are outlined below.  Diagnostic Studies: Recommend MRI brain without contrast Routine MRA head without contrast and MRA Neck with contrast Transthoracic Echo with bubble study, if available  Laboratory Studies: Recommend Lipid panel Hemoglobin A1c  Medication: Initiate Aspirin 81 mg daily  Nursing Recommendations: Telemetry, IV Fluids, avoid dextrose containing fluids, Maintain euglycemia Neuro checks q4 hrs x 24 hrs and then per shift Head of bed 30 degrees  Consultations: Recommend Speech therapy if failed dysphagia screen Physical therapy/Occupational therapy  DVT Prophylaxis: Choice of Primary Team  Disposition: Neurology will follow   Metrics: [facility] Notification Time: 07/15/2021 04:39:18 Stamp Time: 07/15/2021 04:41:02 Callback Response Time: 07/15/2021 04:41:21   ----------------------------------------------------------------------------------------------------  Chief Complaint: L  numbness  History of Present Illness: Patient is a 25 year old Male.  25 yr old man hx of DM, HTN, heart murmur, CAD, anxiety, presents to ER with L sided numbness, arm, and leg. He reports going to bed around 1800 - 1900 last evening, and at that time, he had no new symptoms. He woke up around 2100 with L arm, leg numbness, tingling, and some L arm heaviness. Symptoms lasted about 2-4 hrs per pt, resolved. At this time, he feels back to baseline.   Past Medical History:      Hypertension      Diabetes Mellitus      Coronary Artery Disease  Anticoagulant use:  No  Antiplatelet use: Yes asa   Examination: BP(154/89), Pulse(88), Blood Glucose(87) 1A: Level of Consciousness - Alert; keenly responsive + 0 1B: Ask Month and Age - Both Questions Right + 0 1C: Blink Eyes & Squeeze Hands - Performs Both Tasks + 0 2: Test Horizontal Extraocular Movements - Normal + 0 3: Test Visual Fields - No Visual Loss + 0 4: Test Facial Palsy (Use Grimace if Obtunded) - Normal symmetry + 0 5A: Test Left Arm Motor Drift - No Drift for 10 Seconds + 0 5B: Test Right Arm Motor Drift - No Drift for 10 Seconds + 0 6A: Test Left Leg Motor Drift - No Drift for 5 Seconds + 0 6B: Test Right Leg Motor Drift - No Drift for 5 Seconds + 0 7: Test Limb Ataxia (FNF/Heel-Shin) - No Ataxia + 0 8: Test Sensation - Normal; No sensory loss + 0 9: Test Language/Aphasia - Normal; No aphasia + 0 10: Test Dysarthria - Normal +  0 11: Test Extinction/Inattention - No abnormality + 0  NIHSS Score: 0     Patient / Family was informed the Neurology Consult would occur via TeleHealth consult by way of interactive audio and video telecommunications and consented to receiving care in this manner.  Patient is being evaluated for possible acute neurologic impairment and high probability of imminent or life - threatening deterioration.I spent total of 35 minutes providing care to this patient, including time for face to face visit  via telemedicine, review of medical records, imaging studies and discussion of findings with providers, the patient and / or family.   Dr Lloyd Huger   [facility] [phone]  Case [id number]

## 2021-07-15 NOTE — ED Notes (Signed)
Patient transported to CT 

## 2021-07-25 ENCOUNTER — Other Ambulatory Visit: Payer: Self-pay

## 2021-07-25 ENCOUNTER — Ambulatory Visit (INDEPENDENT_AMBULATORY_CARE_PROVIDER_SITE_OTHER): Payer: BC Managed Care – PPO | Admitting: Podiatry

## 2021-07-25 ENCOUNTER — Ambulatory Visit (INDEPENDENT_AMBULATORY_CARE_PROVIDER_SITE_OTHER): Payer: BC Managed Care – PPO

## 2021-07-25 DIAGNOSIS — M898X7 Other specified disorders of bone, ankle and foot: Secondary | ICD-10-CM | POA: Diagnosis not present

## 2021-07-25 DIAGNOSIS — M21961 Unspecified acquired deformity of right lower leg: Secondary | ICD-10-CM | POA: Diagnosis not present

## 2021-07-25 DIAGNOSIS — L97511 Non-pressure chronic ulcer of other part of right foot limited to breakdown of skin: Secondary | ICD-10-CM

## 2021-07-25 DIAGNOSIS — L909 Atrophic disorder of skin, unspecified: Secondary | ICD-10-CM | POA: Diagnosis not present

## 2021-07-25 NOTE — Progress Notes (Signed)
  Subjective:  Patient ID: [name], male    DOB: 24-Jul-1996,  MRN: [id number]  No chief complaint on file.  DOS: 5/29 Procedure: 5th ray amputation Jacqualyn Posey)  DOS: 05/24/21 Procedure: delayed wound closure ([place]i [place])  25 y.o. male presents for f/u. States the foot is doing better. Wound at the outside of the foot has healed, the wound at the bottom of the foot has stayed about the same.  Objective:  Physical Exam: tenderness at the surgical site, local edema noted, and calf supple, nontender. Incision: small 1cmx0.5 ulcer without warmth, erythema, SOI otherwise healed. Plantar 3rd met head with small area of skin breakdown - 0.5cm diameter. Wound granular, no warmth, erythema, SOI.  Assessment:   1. Ulcer of right foot limited to breakdown of skin ([place])   2. Metatarsal deformity, right   3. Pain in metatarsus of right foot   4. Fat pad atrophy of foot     Plan:  Patient was evaluated and treated and all questions answered.  Post-operative State -Healed  Submet 4 ulcer -XR taken and reviewed. Ulceration appears to be from the 3rd metatarsal head, not fourth.  -Plan for -Patient has failed all conservative therapy and wishes to proceed with surgical intervention. All risks, benefits, and alternatives discussed with patient. No guarantees given. Consent reviewed and signed by patient. -Planned procedures: right 3rd metatarsal floating osteotomy -ASA 3 - Patient with moderate systemic disease with functional limitations;  -Post-op anticoagulation: chemoprophylaxis not indicated -DME dispensed for post-op use: Surgical Shoe    No follow-ups on file.

## 2021-07-26 ENCOUNTER — Telehealth: Payer: Self-pay | Admitting: Urology

## 2021-07-26 NOTE — Telephone Encounter (Signed)
DOS - 08/09/21  METATARSAL OSTEOTOMY 3RD RIGHT --- 28308  [employer] EFFECTIVE DATE - 12/24/20  PLAN DEDUCTIBLE - $5,500.00 W/ $0.00 REMAINING OUT OF POCKET - $6,850.00 W/ $0.00 REMAINING COINSURANCE - 0% COPAY - $0.00   SPOKE WITH KAMIAH WITH [employer] AND SHE STATED FOR CPT CODE 16109 NO PRIOR AUTH IS REQUIRED.  REF # [id number]

## 2021-07-28 ENCOUNTER — Telehealth: Payer: Self-pay

## 2021-07-28 NOTE — Telephone Encounter (Signed)
Left patient a voice mail to call back to schedule an appointment after a message we received to be seen. Ok to schedule with another provide, Dr. West Bali will have availability for the week of 8/3

## 2021-07-31 ENCOUNTER — Telehealth: Payer: Self-pay

## 2021-07-31 NOTE — Telephone Encounter (Signed)
Left patient a voice mail to call back to schedule a follow up appointment Dr. West Bali or another Provider for a 32mn slot.

## 2021-07-31 NOTE — Telephone Encounter (Signed)
-----   Message from Carlean Purl, RN sent at 07/28/2021 11:19 AM EDT ----- Regarding: RE: scheduling Ok to schedule with someone else; 30 minute slot please. Thanks! ----- Message ----- From: Cassell Smiles Sent: 07/28/2021  11:10 AM EDT To: Carlean Purl, RN Subject: scheduling                                     This patient was sent in the in  basket , to schedule with Dr. West Bali but she doesn't have anything available until the week if 8/23, is it ok schedule the appointment that far out or should I scheduled with a different provider?

## 2021-08-04 ENCOUNTER — Telehealth: Payer: Self-pay

## 2021-08-04 NOTE — Telephone Encounter (Signed)
Have left multiple voice mails, patient had requested an appointment to see Dr. West Bali. 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

## 2021-08-04 NOTE — Telephone Encounter (Signed)
-----   Message from Carlean Purl, RN sent at 07/28/2021 11:19 AM EDT ----- Regarding: RE: scheduling Ok to schedule with someone else; 30 minute slot please. Thanks! ----- Message ----- From: Cassell Smiles Sent: 07/28/2021  11:10 AM EDT To: Carlean Purl, RN Subject: scheduling                                     This patient was sent in the in  basket , to schedule with Dr. West Bali but she doesn't have anything available until the week if 8/23, is it ok schedule the appointment that far out or should I scheduled with a different provider?

## 2021-08-09 ENCOUNTER — Other Ambulatory Visit: Payer: Self-pay | Admitting: Podiatry

## 2021-08-09 ENCOUNTER — Encounter: Payer: Self-pay | Admitting: Podiatry

## 2021-08-09 DIAGNOSIS — M21541 Acquired clubfoot, right foot: Secondary | ICD-10-CM

## 2021-08-09 MED ORDER — CLINDAMYCIN HCL 150 MG PO CAPS: 150.0000 mg | ORAL_CAPSULE | Freq: Two times a day (BID) | ORAL | 0 refills | Status: DC

## 2021-08-09 MED ORDER — OXYCODONE-ACETAMINOPHEN 5-325 MG PO TABS: 1.0000 | ORAL_TABLET | ORAL | 0 refills | Status: DC | PRN

## 2021-08-09 NOTE — Progress Notes (Signed)
Rx sent to pharmacy for outpatient surgery. °

## 2021-08-15 ENCOUNTER — Ambulatory Visit (INDEPENDENT_AMBULATORY_CARE_PROVIDER_SITE_OTHER): Payer: BC Managed Care – PPO

## 2021-08-15 ENCOUNTER — Other Ambulatory Visit: Payer: Self-pay

## 2021-08-15 ENCOUNTER — Ambulatory Visit (INDEPENDENT_AMBULATORY_CARE_PROVIDER_SITE_OTHER): Payer: BC Managed Care – PPO | Admitting: Podiatry

## 2021-08-15 DIAGNOSIS — Z9889 Other specified postprocedural states: Secondary | ICD-10-CM | POA: Diagnosis not present

## 2021-08-15 NOTE — Progress Notes (Signed)
  Subjective:  Patient ID: [name], male    DOB: [birth year],  MRN: [id number]  Chief Complaint  Patient presents with   Routine Post Op    PT stated that he is doing well he has no concerns at this time     DOS: 08/09/21 Procedure: Right 4th met floating osteotomy   25 y.o. male presents with the above complaint. History confirmed with patient. Doing very well denies pain or discomfort.  Objective:  Physical Exam: tenderness at the surgical site, local edema noted, and calf supple, nontender. Incision: healing well, no significant drainage, no dehiscence, no significant erythema  No images are attached to the encounter.  Radiographs: X-ray of the right foot: consistent with post-op state osteotomy noted 3rd metatarsal  Assessment:   1. Post-operative state     Plan:  Patient was evaluated and treated and all questions answered.  Post-operative State -XR reviewed with patient -Ok to start showering at this time. Advised they cannot soak. -Dressing applied consisting of antibiotic ointment and band-aid -WBAT in Surgical shoe -Ulcer appears healed submet 3. The 4th metatarsal is palpable but without ulceration. Should this ulcerate would consider osteotomy at this area as well. -XRs needed at follow-up: none   No follow-ups on file.

## 2021-08-24 DIAGNOSIS — L259 Unspecified contact dermatitis, unspecified cause: Secondary | ICD-10-CM | POA: Diagnosis not present

## 2021-08-24 DIAGNOSIS — I10 Essential (primary) hypertension: Secondary | ICD-10-CM | POA: Diagnosis not present

## 2021-08-24 DIAGNOSIS — E1165 Type 2 diabetes mellitus with hyperglycemia: Secondary | ICD-10-CM | POA: Diagnosis not present

## 2021-08-24 DIAGNOSIS — G4700 Insomnia, unspecified: Secondary | ICD-10-CM | POA: Diagnosis not present

## 2021-08-26 ENCOUNTER — Encounter: Payer: Self-pay | Admitting: Podiatry

## 2021-08-29 ENCOUNTER — Encounter: Payer: BC Managed Care – PPO | Admitting: Podiatry

## 2021-08-29 DIAGNOSIS — F332 Major depressive disorder, recurrent severe without psychotic features: Secondary | ICD-10-CM | POA: Diagnosis not present

## 2021-08-29 DIAGNOSIS — F419 Anxiety disorder, unspecified: Secondary | ICD-10-CM | POA: Diagnosis not present

## 2021-09-01 ENCOUNTER — Encounter: Payer: BC Managed Care – PPO | Admitting: Podiatry

## 2021-09-08 ENCOUNTER — Ambulatory Visit (INDEPENDENT_AMBULATORY_CARE_PROVIDER_SITE_OTHER): Payer: BC Managed Care – PPO | Admitting: Podiatry

## 2021-09-08 DIAGNOSIS — Z5329 Procedure and treatment not carried out because of patient's decision for other reasons: Secondary | ICD-10-CM

## 2021-09-20 ENCOUNTER — Telehealth: Payer: Self-pay | Admitting: Neurology

## 2021-09-20 NOTE — Telephone Encounter (Signed)
LVM for patient to call back to reschedule appt due to Dr. Rexene Alberts not in the office that day. He can have any spot we have held for her reschedules between now and December. I also send mychart message asking him to call us.

## 2021-09-27 DIAGNOSIS — F410 Panic disorder [episodic paroxysmal anxiety] without agoraphobia: Secondary | ICD-10-CM | POA: Diagnosis not present

## 2021-09-27 DIAGNOSIS — F331 Major depressive disorder, recurrent, moderate: Secondary | ICD-10-CM | POA: Diagnosis not present
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
## 2021-10-05 ENCOUNTER — Ambulatory Visit: Payer: BC Managed Care – PPO | Admitting: Family Medicine

## 2021-10-05 DIAGNOSIS — E119 Type 2 diabetes mellitus without complications: Secondary | ICD-10-CM

## 2021-10-05 DIAGNOSIS — I10 Essential (primary) hypertension: Secondary | ICD-10-CM

## 2021-10-05 DIAGNOSIS — R0789 Other chest pain: Secondary | ICD-10-CM

## 2021-10-05 DIAGNOSIS — R000 Tachycardia, unspecified: Secondary | ICD-10-CM

## 2021-10-05 DIAGNOSIS — R0609 Other forms of dyspnea: Secondary | ICD-10-CM

## 2021-10-10 ENCOUNTER — Ambulatory Visit: Payer: BC Managed Care – PPO | Admitting: Neurology

## 2021-10-11 DIAGNOSIS — I10 Essential (primary) hypertension: Secondary | ICD-10-CM | POA: Diagnosis not present

## 2021-10-11 DIAGNOSIS — E1165 Type 2 diabetes mellitus with hyperglycemia: Secondary | ICD-10-CM | POA: Diagnosis not present

## 2021-10-11 DIAGNOSIS — G4700 Insomnia, unspecified: Secondary | ICD-10-CM | POA: Diagnosis not present

## 2021-10-11 DIAGNOSIS — I739 Peripheral vascular disease, unspecified: Secondary | ICD-10-CM | POA: Diagnosis not present
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
## 2021-10-16 ENCOUNTER — Ambulatory Visit: Payer: BC Managed Care – PPO | Admitting: Family Medicine

## 2021-10-16 DIAGNOSIS — R002 Palpitations: Secondary | ICD-10-CM

## 2021-10-16 DIAGNOSIS — R0789 Other chest pain: Secondary | ICD-10-CM

## 2021-10-16 DIAGNOSIS — E119 Type 2 diabetes mellitus without complications: Secondary | ICD-10-CM

## 2021-10-16 DIAGNOSIS — R0609 Other forms of dyspnea: Secondary | ICD-10-CM

## 2021-10-16 DIAGNOSIS — R000 Tachycardia, unspecified: Secondary | ICD-10-CM

## 2021-10-16 DIAGNOSIS — I10 Essential (primary) hypertension: Secondary | ICD-10-CM

## 2021-10-17 ENCOUNTER — Encounter (HOSPITAL_COMMUNITY): Payer: Self-pay

## 2021-10-17 ENCOUNTER — Emergency Department (HOSPITAL_COMMUNITY)
Admission: EM | Admit: 2021-10-17 | Discharge: 2021-10-18 | Disposition: A | Discharge status: Home / Self Care | Payer: BC Managed Care – PPO | Attending: Emergency Medicine | Admitting: Emergency Medicine

## 2021-10-17 ENCOUNTER — Other Ambulatory Visit: Payer: Self-pay

## 2021-10-17 DIAGNOSIS — I10 Essential (primary) hypertension: Secondary | ICD-10-CM | POA: Diagnosis not present

## 2021-10-17 DIAGNOSIS — R112 Nausea with vomiting, unspecified: Secondary | ICD-10-CM | POA: Insufficient documentation

## 2021-10-17 DIAGNOSIS — E119 Type 2 diabetes mellitus without complications: Secondary | ICD-10-CM | POA: Diagnosis not present

## 2021-10-17 DIAGNOSIS — R1111 Vomiting without nausea: Secondary | ICD-10-CM | POA: Diagnosis not present

## 2021-10-17 DIAGNOSIS — Z7722 Contact with and (suspected) exposure to environmental tobacco smoke (acute) (chronic): Secondary | ICD-10-CM | POA: Insufficient documentation

## 2021-10-17 DIAGNOSIS — R0902 Hypoxemia: Secondary | ICD-10-CM | POA: Diagnosis not present

## 2021-10-17 DIAGNOSIS — R1110 Vomiting, unspecified: Secondary | ICD-10-CM | POA: Diagnosis not present

## 2021-10-17 DIAGNOSIS — R110 Nausea: Secondary | ICD-10-CM | POA: Diagnosis not present

## 2021-10-17 LAB — CBC WITH DIFFERENTIAL/PLATELET
Abs Immature Granulocytes: 0.07 10*3/uL (ref 0.00–0.07)
Basophils Absolute: 0.1 10*3/uL (ref 0.0–0.1)
Basophils Relative: 0 %
Eosinophils Absolute: 0.1 10*3/uL (ref 0.0–0.5)
Eosinophils Relative: 0 %
HCT: 43.9 % (ref 39.0–52.0)
Hemoglobin: 15.2 g/dL (ref 13.0–17.0)
Immature Granulocytes: 1 %
Lymphocytes Relative: 16 %
Lymphs Abs: 2.2 10*3/uL (ref 0.7–4.0)
MCH: 28.8 pg (ref 26.0–34.0)
MCHC: 34.6 g/dL (ref 30.0–36.0)
MCV: 83.1 fL (ref 80.0–100.0)
Monocytes Absolute: 1 10*3/uL (ref 0.1–1.0)
Monocytes Relative: 7 %
Neutro Abs: 10.3 10*3/uL — ABNORMAL HIGH (ref 1.7–7.7)
Neutrophils Relative %: 76 %
Platelets: 343 10*3/uL (ref 150–400)
RBC: 5.28 MIL/uL (ref 4.22–5.81)
RDW: 13.2 % (ref 11.5–15.5)
WBC: 13.7 10*3/uL — ABNORMAL HIGH (ref 4.0–10.5)
nRBC: 0 % (ref 0.0–0.2)

## 2021-10-17 LAB — COMPREHENSIVE METABOLIC PANEL
ALT: 17 U/L (ref 0–44)
AST: 17 U/L (ref 15–41)
Albumin: 4.7 g/dL (ref 3.5–5.0)
Alkaline Phosphatase: 85 U/L (ref 38–126)
Anion gap: 10 (ref 5–15)
BUN: 10 mg/dL (ref 6–20)
CO2: 26 mmol/L (ref 22–32)
Calcium: 9.8 mg/dL (ref 8.9–10.3)
Chloride: 100 mmol/L (ref 98–111)
Creatinine, Ser: 0.67 mg/dL (ref 0.61–1.24)
GFR, Estimated: >60 mL/min (ref >60)
Glucose, Bld: 220 mg/dL — ABNORMAL HIGH (ref 70–99)
Potassium: 4.7 mmol/L (ref 3.5–5.1)
Sodium: 136 mmol/L (ref 135–145)
Total Bilirubin: 1.4 mg/dL — ABNORMAL HIGH (ref 0.3–1.2)
Total Protein: 9 g/dL — ABNORMAL HIGH (ref 6.5–8.1)

## 2021-10-17 LAB — CBG MONITORING, ED: Glucose-Capillary: 200 mg/dL — ABNORMAL HIGH (ref 70–99)

## 2021-10-17 MED ORDER — ONDANSETRON 4 MG PO TBDP
4.0000 mg | ORAL_TABLET | Freq: Once | ORAL | Status: AC
  Administered 2021-10-17: 4 mg via ORAL
  Filled 2021-10-17: qty 1

## 2021-10-17 MED ORDER — ONDANSETRON 4 MG PO TBDP: 4.0000 mg | ORAL_TABLET | Freq: Three times a day (TID) | ORAL | 0 refills | Status: DC | PRN

## 2021-10-17 NOTE — ED Notes (Signed)
ED Provider at bedside. 

## 2021-10-17 NOTE — ED Provider Notes (Signed)
[facility] Emergency Department Provider Note MRN:  [id number]  Arrival date & time: 10/17/21     Chief Complaint   Emesis   History of Present Illness   [name] is a 25 y.o. year-old male with a history of diabetes presenting to the ED with chief complaint of emesis.  Nausea and vomiting for the past 4 days, gradual onset, intermittent, unable to keep down any water or food.  Denies any abdominal pain, no diarrhea, no fever, no chest pain or shortness of breath, no headache.  Symptoms moderate, a bit better today.  Review of Systems  A complete 10 system review of systems was obtained and all systems are negative except as noted in the HPI and PMH.   Patient's Health History    Past Medical History:  Diagnosis Date   Complication of anesthesia    Hard to wake up when he was younger once.   Diabetes mellitus, type II ([place])    Heart murmur    Hypertension     Past Surgical History:  Procedure Laterality Date   AMPUTATION Right 05/21/2021   Procedure: AMPUTATION RAY 5th;  Surgeon: Trula Slade, DPM;  Location: [place];  Service: Podiatry;  Laterality: Right;   PALATE / UVULA BIOPSY / EXCISION     "growth removed"   UPPER GASTROINTESTINAL ENDOSCOPY     Done in [place] - 2013 ish   WOUND DEBRIDEMENT Right 05/24/2021   Procedure: RIGHT FOOT WOUND DEBRIDEMENT AND CLOSURE;  Surgeon: Evelina Bucy, DPM;  Location: [place];  Service: Podiatry;  Laterality: Right;    Family History  Problem Relation Age of Onset   Diabetes Maternal Grandmother    Thyroid disease Maternal Grandmother    Hypertension Maternal Grandmother    Healthy Mother    Healthy Father    Healthy Sister    Healthy Sister     Social History   Socioeconomic History   Marital status: Single    Spouse name: Not on file   Number of children: Not on file   Years of education: Not on file   Highest education level: Not on file  Occupational History   Not on file   Tobacco Use   Smoking status: Passive Smoke Exposure - Never Smoker   Smokeless tobacco: Never  Vaping Use   Vaping Use: Never used  Substance and Sexual Activity   Alcohol use: No   Drug use: No   Sexual activity: Not on file  Other Topics Concern   Not on file  Social History Narrative   Not on file   Social Determinants of Health   Financial Resource Strain: Not on file  Food Insecurity: Not on file  Transportation Needs: Not on file  Physical Activity: Not on file  Stress: Not on file  Social Connections: Not on file  Intimate Partner Violence: Not on file     Physical Exam   Vitals:   10/17/21 1915 10/17/21 2301  BP: (!) 137/99 (!) 160/102  Pulse: (!) 104 94  Resp: 19 17  Temp: 98.4 F (36.9 C)   SpO2: 100% 99%    CONSTITUTIONAL: Well-appearing, NAD NEURO:  Alert and oriented x 3, no focal deficits EYES:  eyes equal and reactive ENT/NECK:  no LAD, no JVD CARDIO: Regular rate, well-perfused, normal S1 and S2 PULM:  CTAB no wheezing or rhonchi GI/GU:  normal bowel sounds, non-distended, non-tender MSK/SPINE:  No gross deformities, no edema SKIN:  no rash, atraumatic  PSYCH:  Appropriate speech and behavior  *Additional and/or pertinent findings included in MDM below  Diagnostic and Interventional Summary    EKG Interpretation  Date/Time:    Ventricular Rate:    PR Interval:    QRS Duration:   QT Interval:    QTC Calculation:   R Axis:     Text Interpretation:         Labs Reviewed  CBC WITH DIFFERENTIAL/PLATELET - Abnormal; Notable for the following components:      Result Value   WBC 13.7 (*)    Neutro Abs 10.3 (*)    All other components within normal limits  COMPREHENSIVE METABOLIC PANEL - Abnormal; Notable for the following components:   Glucose, Bld 220 (*)    Total Protein 9.0 (*)    Total Bilirubin 1.4 (*)    All other components within normal limits  CBG MONITORING, ED - Abnormal; Notable for the following components:    Glucose-Capillary 200 (*)    All other components within normal limits    No orders to display    Medications  ondansetron (ZOFRAN-ODT) disintegrating tablet 4 mg (4 mg Oral Given 10/17/21 2319)     Procedures  /  Critical Care Procedures  ED Course and Medical Decision Making  I have reviewed the triage vital signs, the nursing notes, and pertinent available records from the EMR.  Listed above are laboratory and imaging tests that I personally ordered, reviewed, and interpreted and then considered in my medical decision making (see below for details).  Differential diagnosis includes viral illness, gastroparesis, DKA, AKI.  Labs are very reassuring, patient is well-appearing with normal vital signs, completely soft and nontender abdomen.  Will p.o. challenge and reassess.  Anticipating discharge.       Barth Kirks. Sedonia Small, [place] [email or web address]  Final Clinical Impressions(s) / ED Diagnoses     ICD-10-CM   1. Nausea and vomiting, unspecified vomiting type  R11.2       ED Discharge Orders          Ordered    ondansetron (ZOFRAN ODT) 4 MG disintegrating tablet  Every 8 hours PRN        10/17/21 2358             Discharge Instructions Discussed with and Provided to Patient:     Discharge Instructions      You were evaluated in the Emergency Department and after careful evaluation, we did not find any emergent condition requiring admission or further testing in the hospital.  Your exam/testing today was overall reassuring.  Symptoms likely due to a viral illness.  You can use the Zofran at home to help with nausea and help you drink fluids.  You should be feeling better over the next few days.  Please return to the Emergency Department if you experience any worsening of your condition.  Thank you for allowing [place] to be a part of your care.         Maudie Flakes, MD 10/17/21 [phone]

## 2021-10-17 NOTE — Discharge Instructions (Addendum)
You were evaluated in the Emergency Department and after careful evaluation, we did not find any emergent condition requiring admission or further testing in the hospital.  Your exam/testing today was overall reassuring.  Symptoms likely due to a viral illness.  You can use the Zofran at home to help with nausea and help you drink fluids.  You should be feeling better over the next few days.  Please return to the Emergency Department if you experience any worsening of your condition.  Thank you for allowing [place] to be a part of your care.

## 2021-10-17 NOTE — ED Triage Notes (Signed)
Pt reports nausea and vomiting x 4 days. Pt has not taken any of his medications, including his Lantus. Per EMS blood sugar was 206. Blood sugar rechecked in triage.
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
## 2021-10-30 ENCOUNTER — Ambulatory Visit (INDEPENDENT_AMBULATORY_CARE_PROVIDER_SITE_OTHER): Payer: BC Managed Care – PPO | Admitting: Family Medicine

## 2021-10-30 ENCOUNTER — Encounter: Payer: Self-pay | Admitting: Family Medicine

## 2021-10-30 VITALS — BP 126/80 | HR 106 | Ht 75.5 in | Wt 206.0 lb

## 2021-10-30 DIAGNOSIS — I10 Essential (primary) hypertension: Secondary | ICD-10-CM

## 2021-10-30 DIAGNOSIS — R0789 Other chest pain: Secondary | ICD-10-CM | POA: Diagnosis not present

## 2021-10-30 DIAGNOSIS — E119 Type 2 diabetes mellitus without complications: Secondary | ICD-10-CM

## 2021-10-30 DIAGNOSIS — Z794 Long term (current) use of insulin: Secondary | ICD-10-CM

## 2021-10-30 DIAGNOSIS — R000 Tachycardia, unspecified: Secondary | ICD-10-CM

## 2021-10-30 DIAGNOSIS — Z01818 Encounter for other preprocedural examination: Secondary | ICD-10-CM

## 2021-10-30 DIAGNOSIS — R002 Palpitations: Secondary | ICD-10-CM

## 2021-10-30 DIAGNOSIS — R0609 Other forms of dyspnea: Secondary | ICD-10-CM

## 2021-10-30 NOTE — Patient Instructions (Signed)
Medication Instructions:  Your physician recommends that you continue on your current medications as directed. Please refer to the Current Medication list given to you today.  *If you need a refill on your cardiac medications before your next appointment, please call your pharmacy*   Lab Work:  [place]   If you have labs (blood work) drawn today and your tests are completely normal, you will receive your results only by: [place] (if you have MyChart) OR A paper copy in the mail If you have any lab test that is abnormal or we need to change your treatment, we will call you to review the results.   Testing/Procedures: NONE ORDERED  TODAY     Follow-Up: At [facility], you and your health needs are our priority.  As part of our continuing mission to provide you with exceptional heart care, we have created designated Provider Care Teams.  These Care Teams include your primary Cardiologist (physician) and Advanced Practice Providers (APPs -  Physician Assistants and Nurse Practitioners) who all work together to provide you with the care you need, when you need it.  We recommend signing up for the patient portal called "MyChart".  Sign up information is provided on this After Visit Summary.  MyChart is used to connect with patients for Virtual Visits (Telemedicine).  Patients are able to view lab/test results, encounter notes, upcoming appointments, etc.  Non-urgent messages can be sent to your provider as well.   To learn more about what you can do with MyChart, go to [email or web address].    Your next appointment:   6 month(s)  The format for your next appointment:   In Person  Provider:   You may see or the following Advanced Practice Provider on your designated Care Team:   Katina Dung, [facility]    Other Instructions

## 2021-11-08 DIAGNOSIS — G4700 Insomnia, unspecified: Secondary | ICD-10-CM | POA: Diagnosis not present

## 2021-11-08 DIAGNOSIS — I739 Peripheral vascular disease, unspecified: Secondary | ICD-10-CM | POA: Diagnosis not present

## 2021-11-08 DIAGNOSIS — I10 Essential (primary) hypertension: Secondary | ICD-10-CM | POA: Diagnosis not present

## 2021-11-08 DIAGNOSIS — E1165 Type 2 diabetes mellitus with hyperglycemia: Secondary | ICD-10-CM | POA: Diagnosis not present

## 2021-11-10 ENCOUNTER — Ambulatory Visit (INDEPENDENT_AMBULATORY_CARE_PROVIDER_SITE_OTHER): Payer: BC Managed Care – PPO

## 2021-11-10 ENCOUNTER — Other Ambulatory Visit: Payer: Self-pay

## 2021-11-10 ENCOUNTER — Ambulatory Visit (INDEPENDENT_AMBULATORY_CARE_PROVIDER_SITE_OTHER): Payer: BC Managed Care – PPO | Admitting: Podiatry

## 2021-11-10 DIAGNOSIS — Z9889 Other specified postprocedural states: Secondary | ICD-10-CM

## 2021-11-10 DIAGNOSIS — L97511 Non-pressure chronic ulcer of other part of right foot limited to breakdown of skin: Secondary | ICD-10-CM | POA: Diagnosis not present

## 2021-11-10 NOTE — Progress Notes (Signed)
  Subjective:  Patient ID: SAKETH DAUBERT, male    DOB: May 02, 1996,  MRN: [id number]  Chief Complaint  Patient presents with   Routine Post Op    POV #2 DOS 08/09/2021 RT FOOT 3RD METATARSAL FLOATING OSTEOTOMY. PT states he has been doing well. No questions or concerns.    DOS: 08/09/21 Procedure: Right 4th met floating osteotomy   25 y.o. male presents with the above complaint. History confirmed with patient. Does not have any pain states the ulcer has healed and remained healed the stitches came out on their own.  Objective:  Physical Exam: no tenderness at the surgical site and no edema noted. Incision: incision well healed. Ulcer healed plantaly. Prominent 4th metatarsal but without pain.  No images are attached to the encounter.  Radiographs: X-ray of the right foot: consistent with post-op state healed osteotomy noted 3rd metatarsal  Assessment:   1. Ulcer of right foot limited to breakdown of skin ([place])   2. Post-operative state    Plan:  Patient was evaluated and treated and all questions answered.  Post-operative State -XR reviewed with patient. Bone well healed. -Continue normal shoegear. Will make appt for DM inserts, unsure if he qualifies for DM shoes. -Ulcer remains healed submet 3. The 4th metatarsal is still palpable but without ulceration. Should this ulcerate would consider osteotomy at this area as well. -XRs needed at follow-up: none   Return in about 3 months (around 02/10/2022) for Diabetic Foot Care.

## 2021-11-14 ENCOUNTER — Ambulatory Visit (INDEPENDENT_AMBULATORY_CARE_PROVIDER_SITE_OTHER): Payer: BC Managed Care – PPO | Admitting: Podiatry

## 2021-11-14 ENCOUNTER — Other Ambulatory Visit: Payer: Self-pay

## 2021-11-14 DIAGNOSIS — L97511 Non-pressure chronic ulcer of other part of right foot limited to breakdown of skin: Secondary | ICD-10-CM | POA: Diagnosis not present

## 2021-11-14 DIAGNOSIS — M21961 Unspecified acquired deformity of right lower leg: Secondary | ICD-10-CM | POA: Diagnosis not present

## 2021-11-14 DIAGNOSIS — M216X2 Other acquired deformities of left foot: Secondary | ICD-10-CM | POA: Diagnosis not present

## 2021-11-14 DIAGNOSIS — Z9889 Other specified postprocedural states: Secondary | ICD-10-CM

## 2021-11-14 NOTE — Progress Notes (Signed)
SITUATION Reason for Consult: Evaluation for Bilateral Custom Foot Orthoses Patient / Caregiver Report: Patient needs foot orthotics to protect his feet from shear and pressure due to type 1 diabetes  OBJECTIVE DATA: Patient History / Diagnosis: Type 1 diabetes Current or Previous Devices: None and no history  Foot Examination: Skin presentation:   Intact Ulcers & Callousing:   None and no history Toe / Foot Deformities:  Pes planus Weight Bearing Presentation:  Planus Sensation:    Intact  ORTHOTIC RECOMMENDATION Recommended Device: 1x pair of custom [employer] FOs  GOALS OF ORTHOSES - Reduce Pain - Prevent Foot Deformity - Prevent Progression of Further Foot Deformity - Relieve Pressure - Improve the Overall Biomechanical Function of the Foot and Lower Extremity.  ACTIONS PERFORMED Patient was casted for Foot Orthoses via crush box. Procedure was explained and patient tolerated procedure well. All questions were answered and concerns addressed.  PLAN Insurance to be verified and out of pocket cost communicated to patient. Once cost verified and agreed upon, casts are to be sent to [facility] for fabrication. Patient is to be called for fitting when devices are ready.

## 2021-11-21 DIAGNOSIS — F331 Major depressive disorder, recurrent, moderate: Secondary | ICD-10-CM | POA: Diagnosis not present

## 2021-11-21 DIAGNOSIS — F410 Panic disorder [episodic paroxysmal anxiety] without agoraphobia: Secondary | ICD-10-CM | POA: Diagnosis not present

## 2021-11-21 DIAGNOSIS — E1165 Type 2 diabetes mellitus with hyperglycemia: Secondary | ICD-10-CM | POA: Diagnosis not present

## 2021-11-28 DIAGNOSIS — J029 Acute pharyngitis, unspecified: Secondary | ICD-10-CM | POA: Diagnosis not present

## 2021-12-06 DIAGNOSIS — F419 Anxiety disorder, unspecified: Secondary | ICD-10-CM | POA: Diagnosis not present

## 2021-12-06 DIAGNOSIS — M79676 Pain in unspecified toe(s): Secondary | ICD-10-CM

## 2021-12-06 DIAGNOSIS — G4700 Insomnia, unspecified: Secondary | ICD-10-CM | POA: Diagnosis not present

## 2021-12-07 ENCOUNTER — Ambulatory Visit: Payer: BC Managed Care – PPO | Admitting: Neurology

## 2021-12-27 ENCOUNTER — Telehealth: Payer: Self-pay | Admitting: Podiatry

## 2021-12-27 NOTE — Telephone Encounter (Signed)
Orthotics in.. lvm for pt to call to schedule an appt to pick them up. °

## 2022-01-02 ENCOUNTER — Other Ambulatory Visit: Payer: Self-pay

## 2022-01-02 ENCOUNTER — Ambulatory Visit: Payer: BC Managed Care – PPO

## 2022-01-02 DIAGNOSIS — Z9889 Other specified postprocedural states: Secondary | ICD-10-CM

## 2022-01-02 NOTE — Progress Notes (Signed)
SITUATION: Reason for Visit: Fitting and Delivery of Custom Fabricated Foot Orthoses Patient Report: Patient reports comfort and is satisfied with device.  OBJECTIVE DATA: Patient History / Diagnosis:     ICD-10-CM   1. Post-operative state  Z98.890       Provided Device:  Functional foot orthotics  GOAL OF ORTHOSIS - Improve gait - Decrease energy expenditure - Improve Balance - Provide Triplanar stability of foot complex - Facilitate motion  ACTIONS PERFORMED Patient was fit with foot orthotics trimmed to shoe last. Patient tolerated fittign procedure. Device was modified as follows to better fit patient: - Toe plate was trimmed to shoe last  Patient was provided with verbal and written instruction and demonstration regarding donning, doffing, wear, care, proper fit, function, purpose, cleaning, and use of the orthosis and in all related precautions and risks and benefits regarding the orthosis.  Patient was also provided with verbal instruction regarding how to report any failures or malfunctions of the orthosis and necessary follow up care. Patient was also instructed to contact our office regarding any change in status that may affect the function of the orthosis.  Patient demonstrated independence with proper donning, doffing, and fit and verbalized understanding of all instructions.  PLAN: Patient is to follow up in one week or as necessary (PRN). All questions were answered and concerns addressed. Plan of care was discussed with and agreed upon by the patient.

## 2022-01-10 DIAGNOSIS — E1165 Type 2 diabetes mellitus with hyperglycemia: Secondary | ICD-10-CM | POA: Diagnosis not present

## 2022-01-10 DIAGNOSIS — I739 Peripheral vascular disease, unspecified: Secondary | ICD-10-CM | POA: Diagnosis not present

## 2022-01-10 DIAGNOSIS — R634 Abnormal weight loss: Secondary | ICD-10-CM | POA: Diagnosis not present

## 2022-01-10 DIAGNOSIS — G4700 Insomnia, unspecified: Secondary | ICD-10-CM | POA: Diagnosis not present

## 2022-01-10 DIAGNOSIS — I10 Essential (primary) hypertension: Secondary | ICD-10-CM | POA: Diagnosis not present

## 2022-01-30 ENCOUNTER — Other Ambulatory Visit: Payer: Self-pay

## 2022-01-30 ENCOUNTER — Ambulatory Visit
Admission: EM | Admit: 2022-01-30 | Discharge: 2022-01-30 | Disposition: A | Discharge status: Home / Self Care | Payer: BC Managed Care – PPO | Attending: Family Medicine | Admitting: Family Medicine

## 2022-01-30 DIAGNOSIS — R000 Tachycardia, unspecified: Secondary | ICD-10-CM | POA: Diagnosis not present

## 2022-01-30 DIAGNOSIS — J069 Acute upper respiratory infection, unspecified: Secondary | ICD-10-CM | POA: Diagnosis not present

## 2022-01-30 DIAGNOSIS — R112 Nausea with vomiting, unspecified: Secondary | ICD-10-CM

## 2022-01-30 MED ORDER — PROMETHAZINE-DM 6.25-15 MG/5ML PO SYRP: 5.0000 mL | ORAL_SOLUTION | Freq: Four times a day (QID) | ORAL | 0 refills | Status: DC | PRN

## 2022-01-30 MED ORDER — MOLNUPIRAVIR EUA 200MG CAPSULE: 4.0000 | ORAL_CAPSULE | Freq: Two times a day (BID) | ORAL | 0 refills | Status: AC

## 2022-01-30 MED ORDER — ONDANSETRON 4 MG PO TBDP: 4.0000 mg | ORAL_TABLET | Freq: Three times a day (TID) | ORAL | 0 refills | Status: DC | PRN

## 2022-01-30 NOTE — ED Provider Notes (Signed)
[facility]    CSN: [id number] Arrival date & time: 01/30/22  1011      History   Chief Complaint No chief complaint on file.   HPI [name] is a 26 y.o. male.   Presenting today with 2-day history of fatigue, headache, generalized body aches, congestion, sore throat, cough, nausea, vomiting, diarrhea.  Denies chest pain, shortness of breath, weakness, dizziness, syncope.  Has been taking Mucinex, Alka-Seltzer cold and sinus with minimal relief.  Mother sick with similar symptoms but states she got better within several days.  History of poorly controlled type 1 diabetes and chronic nonspecific GI issues.   Past Medical History:  Diagnosis Date   Complication of anesthesia    Hard to wake up when he was younger once.   Diabetes mellitus, type II ([place])    Heart murmur    Hypertension     Patient Active Problem List   Diagnosis Date Noted   Diabetes mellitus due to underlying condition with hyperglycemia, with long-term current use of insulin ([place]) 06/20/2021   Medication monitoring encounter 06/20/2021   SIRS (systemic inflammatory response syndrome) ([place]) 06/07/2021   Malnutrition of moderate degree 05/24/2021   Planned postoperative wound closure    Acute osteomyelitis of metatarsal bone of right foot ([place]) 05/20/2021   HLD (hyperlipidemia) 05/20/2021   Anxiety 12/21/2020   Atypical chest pain 12/21/2020   Body mass index (BMI) 30.0-30.9, adult 12/21/2020   Chronic pain 12/21/2020   Disorder of teeth and supporting structures, unspecified 12/21/2020   Epigastric pain 12/21/2020   Gastroesophageal reflux disease 12/21/2020   Headache disorder 12/21/2020   Insomnia 12/21/2020   Leukocytosis 12/21/2020   Oral mucositis (ulcerative), unspecified 12/21/2020   Panic disorder 12/21/2020   Poor sleep pattern 12/21/2020   Sinus tachycardia 58/85/0277   Systolic murmur 41/28/7867   Uncontrolled type 1 diabetes mellitus with hyperglycemia ([place]) 11/26/2018    Personal history of noncompliance with medical treatment, presenting hazards to health 03/15/2017   HTN (hypertension) 03/15/2017   Low back pain 03/14/2017   Neck pain 03/14/2017   Uncontrolled diabetes mellitus with diabetic autonomic neuropathy, with long-term current use of insulin 03/01/2017   Type 2 diabetes mellitus with hyperglycemia ([place]) 10/19/2015    Past Surgical History:  Procedure Laterality Date   AMPUTATION Right 05/21/2021   Procedure: AMPUTATION RAY 5th;  Surgeon: Trula Slade, DPM;  Location: [place];  Service: Podiatry;  Laterality: Right;   PALATE / UVULA BIOPSY / EXCISION     "growth removed"   UPPER GASTROINTESTINAL ENDOSCOPY     Done in [place] - 2013 ish   WOUND DEBRIDEMENT Right 05/24/2021   Procedure: RIGHT FOOT WOUND DEBRIDEMENT AND CLOSURE;  Surgeon: Evelina Bucy, DPM;  Location: [place];  Service: Podiatry;  Laterality: Right;       Home Medications    Prior to Admission medications   Medication Sig Start Date End Date Taking? Authorizing Provider  molnupiravir EUA (LAGEVRIO) 200 mg CAPS capsule Take 4 capsules (800 mg total) by mouth 2 (two) times daily for 5 days. 01/30/22 02/04/22 Yes Volney American, PA-C  ondansetron (ZOFRAN-ODT) 4 MG disintegrating tablet Take 1 tablet (4 mg total) by mouth every 8 (eight) hours as needed for nausea or vomiting. 01/30/22  Yes Volney American, PA-C  promethazine-dextromethorphan (PROMETHAZINE-DM) 6.25-15 MG/5ML syrup Take 5 mLs by mouth 4 (four) times daily as needed. 01/30/22  Yes Volney American, PA-C  acetaminophen (TYLENOL) 500 MG tablet Take 1,000  mg by mouth every 6 (six) hours as needed for moderate pain or headache.    [provider]  aspirin EC 81 MG tablet Take 1 tablet (81 mg total) by mouth daily. 09/13/20   Verta Ellen., NP  atorvastatin (LIPITOR) 10 MG tablet Take 1 tablet (10 mg total) by mouth daily. 05/26/21 08/24/21  Rai, Vernelle Emerald, MD  blood glucose meter kit  and supplies KIT Dispense based on patient and insurance preference. Use up to four times daily as directed. (FOR ICD-10 E11.65). 01/13/19   Cassandria Anger, MD  carvedilol (COREG) 25 MG tablet Take 1 tablet (25 mg total) by mouth 2 (two) times daily. 12/01/18   Herminio Commons, MD  clindamycin (CLEOCIN) 150 MG capsule Take 1 capsule (150 mg total) by mouth 2 (two) times daily. 08/09/21   Evelina Bucy, DPM  hydrOXYzine (ATARAX/VISTARIL) 50 MG tablet Take 50 mg by mouth in the morning and at bedtime.    [provider]  ibuprofen (ADVIL) 800 MG tablet TAKE 1 TABLET BY MOUTH EVERY 8 HOURS AS NEEDED 05/10/21   Carole Civil, MD  insulin glargine (LANTUS SOLOSTAR) 100 UNIT/ML Solostar Pen Inject 35 Units into the skin 2 (two) times daily. 10/18/20   Philemon Kingdom, MD  insulin lispro (HUMALOG KWIKPEN) 200 UNIT/ML KwikPen Inject under skin 10-14 units 3x a day before meals 10/18/20   Philemon Kingdom, MD  Insulin Pen Needle (CARETOUCH PEN NEEDLES) 31G X 6 MM MISC Use 6x a day 10/18/20   Philemon Kingdom, MD  isosorbide mononitrate (IMDUR) 30 MG 24 hr tablet Take 1 tablet (30 mg total) by mouth daily. 05/26/21 10/30/21  Rai, Vernelle Emerald, MD  Lancet Devices [facility]) lancets Use as instructed 4 x daily. e11.65 04/03/17   Cassandria Anger, MD  Lancets (ACCU-CHEK SOFT TOUCH) lancets Use as instructed 08/10/18   Isla Pence, MD  losartan (COZAAR) 25 MG tablet Take 25 mg by mouth daily. 09/19/21   [provider]  Multiple Vitamin (MULTIVITAMIN WITH MINERALS) TABS tablet Take 1 tablet by mouth daily. 06/09/21   Little Ishikawa, MD  Naphazoline HCl (CLEAR EYES OP) Place 1 drop into the left eye daily as needed (dry eyes).    [provider]  nitroGLYCERIN (NITROSTAT) 0.4 MG SL tablet Place 1 tablet (0.4 mg total) under the tongue every 5 (five) minutes as needed for chest pain. 09/13/20 10/30/21  Verta Ellen., NP  omeprazole (PRILOSEC) 20 MG  capsule Take 20 mg by mouth daily.  10/16/18   [provider]  ondansetron (ZOFRAN ODT) 4 MG disintegrating tablet Take 1 tablet (4 mg total) by mouth every 8 (eight) hours as needed for nausea or vomiting. 10/17/21   Maudie Flakes, MD  OVER THE COUNTER MEDICATION Place 4-5 drops into the left ear daily as needed (ringing in ear). Ear ringing medication    [provider]  oxyCODONE-acetaminophen (PERCOCET) 5-325 MG tablet Take 1 tablet by mouth every 4 (four) hours as needed for severe pain. 08/09/21   Evelina Bucy, DPM  OZEMPIC, 1 MG/DOSE, 4 MG/3ML SOPN Inject 1 mg into the skin once a week. 05/19/21   [provider]  silver sulfADIAZINE (SILVADENE) 1 % cream Apply pea-sized amount to wound daily. 06/20/21   Evelina Bucy, DPM  zolpidem (AMBIEN) 10 MG tablet Take 10 mg by mouth at bedtime. 08/17/20   [provider]    Family History Family History  Problem Relation  Age of Onset   Diabetes Maternal Grandmother    Thyroid disease Maternal Grandmother    Hypertension Maternal Grandmother    Healthy Mother    Healthy Father    Healthy Sister    Healthy Sister     Social History Social History   Tobacco Use   Smoking status: Never    Passive exposure: Yes   Smokeless tobacco: Never  Vaping Use   Vaping Use: Never used  Substance Use Topics   Alcohol use: No   Drug use: No     Allergies   Amoxicillin and Lexapro [escitalopram]   Review of Systems Review of Systems Per HPI  Physical Exam Triage Vital Signs ED Triage Vitals [01/30/22 1027]  Enc Vitals Group     BP 94/62     Pulse Rate (!) 112     Resp 18     Temp 97.8 F (36.6 C)     Temp Source Oral     SpO2 98 %     Weight      Height      Head Circumference      Peak Flow      Pain Score      Pain Loc      Pain Edu?      Excl. in [place]?    No data found.  Updated Vital Signs BP 94/62 (BP Location: Left Arm)    Pulse (!) 112    Temp 97.8 F (36.6 C) (Oral)     Resp 18    SpO2 98%   Visual Acuity Right Eye Distance:   Left Eye Distance:   Bilateral Distance:    Right Eye Near:   Left Eye Near:    Bilateral Near:     Physical Exam Vitals and nursing note reviewed.  Constitutional:      Appearance: He is well-developed.  HENT:     Head: Atraumatic.     Right Ear: External ear normal.     Left Ear: External ear normal.     Nose: Rhinorrhea present.     Mouth/Throat:     Mouth: Mucous membranes are moist.     Pharynx: Posterior oropharyngeal erythema present. No oropharyngeal exudate.  Eyes:     Conjunctiva/sclera: Conjunctivae normal.     Pupils: Pupils are equal, round, and reactive to light.  Cardiovascular:     Rate and Rhythm: Normal rate and regular rhythm.  Pulmonary:     Effort: Pulmonary effort is normal. No respiratory distress.     Breath sounds: No wheezing or rales.  Abdominal:     General: Bowel sounds are normal. There is no distension.     Palpations: Abdomen is soft.     Tenderness: There is abdominal tenderness. There is no right CVA tenderness, left CVA tenderness or guarding.     Comments: Minimal diffuse tenderness to palpation without distention or guarding  Musculoskeletal:        General: Normal range of motion.     Cervical back: Normal range of motion and neck supple.  Lymphadenopathy:     Cervical: No cervical adenopathy.  Skin:    General: Skin is warm and dry.  Neurological:     Mental Status: He is alert and oriented to person, place, and time.  Psychiatric:        Behavior: Behavior normal.     UC Treatments / Results  Labs (all labs ordered are listed, but only abnormal results are displayed) Labs Reviewed  COVID-19,  FLU A+B NAA    EKG   Radiology No results found.  Procedures Procedures (including critical care time)  Medications Ordered in UC Medications - No data to display  Initial Impression / Assessment and Plan / UC Course  I have reviewed the triage vital signs and the  nursing notes.  Pertinent labs & imaging results that were available during my care of the patient were reviewed by me and considered in my medical decision making (see chart for details).     Mildly tachycardic in triage, however per chart review he has history of sinus tachycardia.  Otherwise vital signs reassuring and he appears in no acute distress.  Strongly suspect COVID-like illness, COVID and flu testing pending, will treat with antiviral therapy while awaiting these results.  We will also send Zofran for nausea and vomiting, Phenergan DM for cough and discussed ported over-the-counter medications and home care.  Strict return precautions given for any acutely worsening symptoms.  Final Clinical Impressions(s) / UC Diagnoses   Final diagnoses:  Viral URI with cough  Nausea and vomiting, unspecified vomiting type  Tachycardia   Discharge Instructions   None    ED Prescriptions     Medication Sig Dispense Auth. Provider   ondansetron (ZOFRAN-ODT) 4 MG disintegrating tablet Take 1 tablet (4 mg total) by mouth every 8 (eight) hours as needed for nausea or vomiting. 20 tablet Volney American, PA-C   molnupiravir EUA (LAGEVRIO) 200 mg CAPS capsule Take 4 capsules (800 mg total) by mouth 2 (two) times daily for 5 days. 40 capsule Volney American, [place]   promethazine-dextromethorphan (PROMETHAZINE-DM) 6.25-15 MG/5ML syrup Take 5 mLs by mouth 4 (four) times daily as needed. 100 mL Volney American, [place]      PDMP not reviewed this encounter.   Volney American, [place] 01/30/22 1110

## 2022-01-30 NOTE — ED Triage Notes (Signed)
Pt presents to urgent care for fatigue, headache and body aches x 2 days.He does report some lose stools.

## 2022-01-31 LAB — COVID-19, FLU A+B NAA
Influenza A, NAA: NOT DETECTED
Influenza B, NAA: NOT DETECTED
SARS-CoV-2, NAA: DETECTED — AB

## 2022-02-08 ENCOUNTER — Other Ambulatory Visit: Payer: Self-pay

## 2022-02-08 ENCOUNTER — Ambulatory Visit: Payer: BC Managed Care – PPO | Admitting: Neurology

## 2022-02-08 ENCOUNTER — Other Ambulatory Visit: Payer: Self-pay | Admitting: *Deleted

## 2022-02-08 ENCOUNTER — Encounter: Payer: Self-pay | Admitting: Neurology

## 2022-02-08 VITALS — BP 106/66 | HR 108 | Ht 76.0 in | Wt 181.0 lb

## 2022-02-08 DIAGNOSIS — E0842 Diabetes mellitus due to underlying condition with diabetic polyneuropathy: Secondary | ICD-10-CM

## 2022-02-08 DIAGNOSIS — R200 Anesthesia of skin: Secondary | ICD-10-CM

## 2022-02-08 NOTE — Patient Instructions (Signed)
You likely have a condition called peripheral neuropathy, i. e. nerve damage. Unfortunately, as I mentioned, there is no specific treatment for most neuropathies. The most common cause for neuropathy is diabetes in this [place], in which case, tight glucose control is key.  Some studies suggest that obesity and prediabetes can also cause nerve damage even in the absence of a formal diagnosis of diabetes.    Other causes include thyroid disease, and some vitamin deficiencies. Certain medications such as chemotherapy agents and other chemicals or toxins including alcohol can cause neuropathy. There are some genetic conditions or hereditary neuropathies. Typically patients will report a family history of neuropathy in those conditions. There are cases associated with cancers and autoimmune conditions. Most neuropathies are progressive unless a root cause can be found and treated, which is rare, as I explained. For most neuropathies there is no actual cure or reversing of symptoms. Painful neuropathy can be difficult to treat symptomatically, but there are some medications available to ease the symptoms.  Thankfully, you have no significant pain symptoms at this time and can be monitored for symptoms.    Electrophysiologic testing with nerve conduction velocity studies and EMG (muscle testing) do not always pick up neuropathies that affect the smallest fibers. Other common tests include different type of blood work, and rarely, spinal fluid testing, and sometimes we resort to asking for a nerve and muscle biopsy.  We can also consider specialist input from a neuromuscular specialist, sometimes we make referrals to [facility] or [facility] or [facility].  For now, as discussed, we will proceed with further work-up from my end of things: We will check blood work today and call you with the test results.  We will do an EMG and nerve conduction velocity test, which is an electrical nerve and muscle test, which we  will schedule. We will call you with the results.

## 2022-02-08 NOTE — Progress Notes (Signed)
Subjective:    Patient ID: EDU ON is a 26 y.o. male.  HPI    Star Age, MD, PhD [facility] [address], [address] [place], [place] [address]  I saw patient, [name], as a referral from the ED for transient left-sided numbness.  The patient is unaccompanied today.  [name] is a 26 year old gentleman with an underlying medical history of diabetes, osteomyelitis of the right foot, hyperlipidemia, anxiety, reflux disease, headaches, hypertension, low back pain, neck pain, who presented to the emergency room on 07/14/2021 with left-sided numbness. I reviewed the sparse emergency room records.  He had a brain MRI without contrast on 07/15/2021 with indication of neurodeficit, acute, stroke suspected, left-sided paresthesias, now resolved.  I reviewed the results: Impression: Negative brain MRI.   Laboratory testing from 07/15/2021 indicated a WBC elevated at 18.9.  Neutrophil count was 15.4, also elevated.  CMP showed glucose of 87, BUN 12, creatinine 0.63, total protein elevated at 9.1.  Urinalysis showed protein in the urine but otherwise no evidence of UTI.  UDS was negative.  Last A1c that I could see was from 05/20/2021 and was highly elevated at 10.5.  Prior to that it was 11.4.  He has had elevated A1c since 2018 generally between 8.6 and 13.  His A1c has improved recently.  He had blood work through his primary care in January and his A1c was 6.5 on 01/10/2022.  He essentially reports numbness in his lower extremities for years.  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  He did develop osteomyelitis.  He does not drink any caffeine daily.  He does not drink alcohol on a regular basis.  He does not smoke.  He currently does not work.  He is followed by an eye specialist and gets injections into his eyes for retinopathy as understand.    He denies any history of migraines.  He has family history of neuropathy and diabetes.  He reports numbness affecting both lower extremities, no sudden onset of one-sided weakness, had another brief episode of left-sided numbness in November 2022.  He is single and lives with his mom and younger sister.  His Past Medical History Is Significant For: Past Medical History:  Diagnosis Date   Complication of anesthesia    Hard to wake up when he was younger once.   Diabetes mellitus, type II ([place])    Heart murmur    Hypertension     His Past Surgical History Is Significant For: Past Surgical History:  Procedure Laterality Date   AMPUTATION Right 05/21/2021   Procedure: AMPUTATION RAY 5th;  Surgeon: Trula Slade, DPM;  Location: [facility];  Service: Podiatry;  Laterality: Right;   PALATE / UVULA BIOPSY / EXCISION     "growth removed"   UPPER GASTROINTESTINAL ENDOSCOPY     Done in [place] - 2013 ish   WOUND DEBRIDEMENT Right 05/24/2021   Procedure: RIGHT FOOT WOUND DEBRIDEMENT AND CLOSURE;  Surgeon: Evelina Bucy, DPM;  Location: [facility];  Service: Podiatry;  Laterality: Right;    His Family History Is Significant For: Family History  Problem Relation Age of Onset   Healthy Mother    Healthy Father    Healthy Sister    Healthy Sister    Diabetes Maternal Grandmother    Thyroid disease Maternal Grandmother  Hypertension Maternal Grandmother    Neuropathy Maternal Grandmother     His Social History Is Significant For: Social History   Socioeconomic History   Marital status: Single    Spouse name: Not on file   Number of children: Not on file   Years of education: Not on file   Highest education level: Not on file  Occupational History   Not on file  Tobacco Use   Smoking status: Never    Passive exposure: Yes   Smokeless tobacco: Never  Vaping Use   Vaping Use: Never used  Substance and Sexual Activity   Alcohol use: No   Drug use: No   Sexual activity:  Not on file  Other Topics Concern   Not on file  Social History Narrative   Not on file   Social Determinants of Health   Financial Resource Strain: Not on file  Food Insecurity: Not on file  Transportation Needs: Not on file  Physical Activity: Not on file  Stress: Not on file  Social Connections: Not on file    His Allergies Are:  Allergies  Allergen Reactions   Amoxicillin Hives    Has patient had a PCN reaction causing immediate rash, facial/tongue/throat swelling, SOB or lightheadedness with hypotension: No Has patient had a PCN reaction causing severe rash involving mucus membranes or skin necrosis: No Has patient had a PCN reaction that required hospitalization: No Has patient had a PCN reaction occurring within the last 10 years: No If all of the above answers are "NO", then may proceed with Cephalosporin use.    Lexapro [Escitalopram] Other (See Comments)  :   His Current Medications Are:  Outpatient Encounter Medications as of 02/08/2022  Medication Sig   acetaminophen (TYLENOL) 500 MG tablet Take 1,000 mg by mouth every 6 (six) hours as needed for moderate pain or headache.   aspirin EC 81 MG tablet Take 1 tablet (81 mg total) by mouth daily.   blood glucose meter kit and supplies KIT Dispense based on patient and insurance preference. Use up to four times daily as directed. (FOR ICD-10 E11.65).   carvedilol (COREG) 25 MG tablet Take 1 tablet (25 mg total) by mouth 2 (two) times daily.   FARXIGA 10 MG TABS tablet Take 10 mg by mouth daily.   hydrOXYzine (ATARAX/VISTARIL) 50 MG tablet Take 50 mg by mouth in the morning and at bedtime.   ibuprofen (ADVIL) 800 MG tablet TAKE 1 TABLET BY MOUTH EVERY 8 HOURS AS NEEDED   insulin glargine (LANTUS SOLOSTAR) 100 UNIT/ML Solostar Pen Inject 35 Units into the skin 2 (two) times daily.   insulin lispro (HUMALOG KWIKPEN) 200 UNIT/ML KwikPen Inject under skin 10-14 units 3x a day before meals   Insulin Pen Needle (CARETOUCH PEN  NEEDLES) 31G X 6 MM MISC Use 6x a day   Lancet Devices (ACCU-CHEK SOFTCLIX) lancets Use as instructed 4 x daily. e11.65   Lancets (ACCU-CHEK SOFT TOUCH) lancets Use as instructed   losartan (COZAAR) 25 MG tablet Take 25 mg by mouth daily.   Multiple Vitamin (MULTIVITAMIN WITH MINERALS) TABS tablet Take 1 tablet by mouth daily.   Naphazoline HCl (CLEAR EYES OP) Place 1 drop into the left eye daily as needed (dry eyes).   omeprazole (PRILOSEC) 20 MG capsule Take 20 mg by mouth daily.    ondansetron (ZOFRAN ODT) 4 MG disintegrating tablet Take 1 tablet (4 mg total) by mouth every 8 (eight) hours as needed for nausea or vomiting.  ondansetron (ZOFRAN-ODT) 4 MG disintegrating tablet Take 1 tablet (4 mg total) by mouth every 8 (eight) hours as needed for nausea or vomiting.   OVER THE COUNTER MEDICATION Place 4-5 drops into the left ear daily as needed (ringing in ear). Ear ringing medication   oxyCODONE-acetaminophen (PERCOCET) 5-325 MG tablet Take 1 tablet by mouth every 4 (four) hours as needed for severe pain.   OZEMPIC, 1 MG/DOSE, 4 MG/3ML SOPN Inject 1 mg into the skin once a week.   promethazine-dextromethorphan (PROMETHAZINE-DM) 6.25-15 MG/5ML syrup Take 5 mLs by mouth 4 (four) times daily as needed.   silver sulfADIAZINE (SILVADENE) 1 % cream Apply pea-sized amount to wound daily.   zolpidem (AMBIEN) 10 MG tablet Take 10 mg by mouth at bedtime.   atorvastatin (LIPITOR) 10 MG tablet Take 1 tablet (10 mg total) by mouth daily.   clindamycin (CLEOCIN) 150 MG capsule Take 1 capsule (150 mg total) by mouth 2 (two) times daily.   isosorbide mononitrate (IMDUR) 30 MG 24 hr tablet Take 1 tablet (30 mg total) by mouth daily.   nitroGLYCERIN (NITROSTAT) 0.4 MG SL tablet Place 1 tablet (0.4 mg total) under the tongue every 5 (five) minutes as needed for chest pain.   No facility-administered encounter medications on file as of 02/08/2022.  :   Review of Systems:  Out of a complete 14 point review of  systems, all are reviewed and negative with the exception of these symptoms as listed below:   Review of Systems  Neurological:        Pt is here for neuropathy pain . Pt states he has consistent pain in his lower back and numbness in both both feet and toes. Pt states it feels like his feet are cold all the time  Pt states he is having left sided weakness    Objective:  Neurological Exam  Physical Exam Physical Examination:   Vitals:   02/08/22 0804  BP: 106/66  Pulse: (!) 108    General Examination: The patient is a very pleasant 26 y.o. male in no acute distress. He appears marginally groomed.   HEENT: Normocephalic, atraumatic, pupils are equal, round and reactive to light, extraocular tracking is good without limitation to gaze excursion or nystagmus noted.  Seems to be a right dominant, corrective eyeglasses in place.  Does not have very good eye contact from time to time.  Face is symmetric with normal facial animation and normal facial sensation, hearing is grossly intact, airway examination reveals poor dental hygiene with several missing teeth and tooth decay affecting multiple teeth.  Tongue protrudes centrally and palate elevates symmetrically, no hypophonia, voice or neck tremor, no carotid bruits.    Chest: Clear to auscultation without wheezing, rhonchi or crackles noted.  Heart: S1+S2+0, regular and normal without murmurs, rubs or gallops noted.   Abdomen: Soft, non-tender and non-distended.  Extremities: There is no pitting edema in the distal lower extremities bilaterally.   Skin: Warm and dry without trophic changes noted.  Multiple scabs and wounds to extremities bilaterally as well as hands.  Musculoskeletal: exam reveals no obvious joint deformities, status post fifth digit amputation of right foot.  Wearing right knee brace and limited range of motion in the right knee noted.  Neurologically:  Mental status: The patient is awake, alert and oriented in all 4  spheres. His immediate and remote memory, attention, language skills and fund of knowledge are appropriate. There is no evidence of aphasia, agnosia, apraxia or anomia. Speech is clear  with normal prosody and enunciation. Thought process is linear. Mood is normal and affect is normal.  Cranial nerves II - XII are as described above under HEENT exam.  Motor exam: Normal bulk, strength and tone is noted. There is no tremor, Romberg is negative. Reflexes are 1+ in the upper extremities and absent in the lower extremities, right knee not tested because of knee brace.  Fine motor skills and coordination: grossly intact.  Cerebellar testing: No dysmetria or intention tremor. There is no truncal or gait ataxia.  Normal finger-to-nose and heel-to-shin okay, limited on the right side. Sensory exam: intact to light touch, temperature, vibration and pinprick in the upper extremities with decreased sensation to all modalities up to the mid calf areas bilaterally.  Gait, station and balance: He stands without difficulty, walks without a walking aid.  He walks slowly and cautiously.  Assessment and Plan:   In summary, [name] is a very pleasant 26 y.o.-year old male with an underlying medical history of diabetes, retinopathy, osteomyelitis of the right foot, hyperlipidemia, anxiety, reflux disease, headaches, hypertension, low back pain, neck pain, who presents for evaluation of an episode of transient left-sided numbness that occurred in July 2022.  He had an MRI at the time which was negative for any acute findings.  History and examination are in keeping with neuropathy affecting both lower extremities.  He is not in pain, in fact has very little pain sensation.  He has a longstanding history of uncontrolled diabetes since he was a teenager essentially.  Lately, his A1c has improved dramatically.  He is encouraged to continue to work on lifestyle modification, stay well-hydrated, avoid alcohol, avoid extreme  blood sugar fluctuations.  We will proceed with a brain MRI with and without contrast as he had overall 2 episodes of transient left-sided numbness.  I would like to rule out a structural cause.  In addition, I would like to proceed with additional blood work and neurophysiological testing with EMG and nerve conduction velocity testing through our office to look for evidence of neuropathy versus radiculopathy.  He has a history of low back pain.  He has a history of right knee injury and reports that surgery was delayed because of poor blood sugar control.  He is encouraged to follow-up with his providers as scheduled.  We will call him with his blood test results and EMG results and plan a follow-up in this office accordingly.  I answered all his questions today and he was in agreement.    Star Age, MD, PhD

## 2022-02-09 ENCOUNTER — Telehealth: Payer: Self-pay | Admitting: Neurology

## 2022-02-09 NOTE — Telephone Encounter (Signed)
//  [employer] Josem Kaufmann: [phone] (exp. / to/ ) order sent to GI, they will reach out to the patient to scheudle.

## 2022-02-12 LAB — HIV ANTIBODY (ROUTINE TESTING W REFLEX): HIV Screen 4th Generation wRfx: NONREACTIVE

## 2022-02-12 LAB — MULTIPLE MYELOMA PANEL, SERUM
Albumin SerPl Elph-Mcnc: 4.1 g/dL (ref 2.9–4.4)
Albumin/Glob SerPl: 1.1 (ref 0.7–1.7)
Alpha 1: 0.2 g/dL (ref 0.0–0.4)
Alpha2 Glob SerPl Elph-Mcnc: 1 g/dL (ref 0.4–1.0)
B-Globulin SerPl Elph-Mcnc: 1.4 g/dL — ABNORMAL HIGH (ref 0.7–1.3)
Gamma Glob SerPl Elph-Mcnc: 1.3 g/dL (ref 0.4–1.8)
Globulin, Total: 3.9 g/dL (ref 2.2–3.9)
IgA/Immunoglobulin A, Serum: 458 mg/dL — ABNORMAL HIGH (ref 90–386)
IgG (Immunoglobin G), Serum: 1421 mg/dL (ref 603–1613)
IgM (Immunoglobulin M), Srm: 71 mg/dL (ref 20–172)

## 2022-02-12 LAB — COMPREHENSIVE METABOLIC PANEL
ALT: 14 IU/L (ref 0–44)
AST: 20 IU/L (ref 0–40)
Albumin/Globulin Ratio: 1.6 (ref 1.2–2.2)
Albumin: 4.9 g/dL (ref 4.1–5.2)
Alkaline Phosphatase: 94 IU/L (ref 44–121)
BUN/Creatinine Ratio: 12 (ref 9–20)
BUN: 10 mg/dL (ref 6–20)
Bilirubin Total: 0.6 mg/dL (ref 0.0–1.2)
CO2: 26 mmol/L (ref 20–29)
Calcium: 10.9 mg/dL — ABNORMAL HIGH (ref 8.7–10.2)
Chloride: 100 mmol/L (ref 96–106)
Creatinine, Ser: 0.84 mg/dL (ref 0.76–1.27)
Globulin, Total: 3.1 g/dL (ref 1.5–4.5)
Glucose: 179 mg/dL — ABNORMAL HIGH (ref 70–99)
Potassium: 5.3 mmol/L — ABNORMAL HIGH (ref 3.5–5.2)
Sodium: 140 mmol/L (ref 134–144)
Total Protein: 8 g/dL (ref 6.0–8.5)
eGFR: 125 mL/min/{1.73_m2} (ref >59)

## 2022-02-12 LAB — VITAMIN B1: Thiamine: 117.3 nmol/L (ref 66.5–200.0)

## 2022-02-12 LAB — ANA W/REFLEX: Anti Nuclear Antibody (ANA): NEGATIVE

## 2022-02-12 LAB — VITAMIN B6: Vitamin B6: 17.8 ug/L (ref 3.4–65.2)

## 2022-02-12 LAB — RHEUMATOID FACTOR: Rheumatoid fact SerPl-aCnc: <10 IU/mL (ref <14.0)

## 2022-02-12 LAB — TSH: TSH: 0.317 u[IU]/mL — ABNORMAL LOW (ref 0.450–4.500)

## 2022-02-12 LAB — B12 AND FOLATE PANEL
Folate: 11.8 ng/mL (ref >3.0)
Vitamin B-12: 650 pg/mL (ref 232–1245)

## 2022-02-12 LAB — VITAMIN D 25 HYDROXY (VIT D DEFICIENCY, FRACTURES): Vit D, 25-Hydroxy: 43.7 ng/mL (ref 30.0–100.0)

## 2022-02-12 LAB — C-REACTIVE PROTEIN: CRP: 1 mg/L (ref 0–10)

## 2022-02-12 LAB — RPR: RPR Ser Ql: NONREACTIVE

## 2022-02-13 ENCOUNTER — Encounter: Payer: Self-pay | Admitting: Neurology

## 2022-02-13 ENCOUNTER — Ambulatory Visit: Payer: BC Managed Care – PPO | Admitting: Podiatry

## 2022-02-16 ENCOUNTER — Other Ambulatory Visit: Payer: BC Managed Care – PPO

## 2022-02-20 ENCOUNTER — Ambulatory Visit (INDEPENDENT_AMBULATORY_CARE_PROVIDER_SITE_OTHER): Payer: BC Managed Care – PPO | Admitting: Podiatry

## 2022-02-20 ENCOUNTER — Encounter: Payer: Self-pay | Admitting: Podiatry

## 2022-02-20 ENCOUNTER — Other Ambulatory Visit: Payer: Self-pay

## 2022-02-20 DIAGNOSIS — Z89431 Acquired absence of right foot: Secondary | ICD-10-CM

## 2022-02-20 DIAGNOSIS — E0865 Diabetes mellitus due to underlying condition with hyperglycemia: Secondary | ICD-10-CM

## 2022-02-20 DIAGNOSIS — L84 Corns and callosities: Secondary | ICD-10-CM | POA: Diagnosis not present

## 2022-02-20 DIAGNOSIS — B351 Tinea unguium: Secondary | ICD-10-CM | POA: Diagnosis not present

## 2022-02-20 DIAGNOSIS — Z794 Long term (current) use of insulin: Secondary | ICD-10-CM

## 2022-02-20 DIAGNOSIS — M79675 Pain in left toe(s): Secondary | ICD-10-CM

## 2022-02-20 DIAGNOSIS — M21961 Unspecified acquired deformity of right lower leg: Secondary | ICD-10-CM

## 2022-02-20 DIAGNOSIS — M79674 Pain in right toe(s): Secondary | ICD-10-CM

## 2022-02-20 NOTE — Progress Notes (Signed)
°  Subjective:  Patient ID: [name], male    DOB: 02/11/1996,   MRN: [id number]  Chief Complaint  Patient presents with   diabetic foot exam    Nail trim A1C 10.5 Wounds are healing well,     26 y.o. male presents for routine foot care. Relates painful thickened and elongated toenails as well as pre-ulcerative calluses. Has been in the care of Dr. March Rummage and has had amputation of fifth digit as well as history of ulceration of the plantar foot . He is diabetic and his last A1c was 6.5  Denies any other pedal complaints. Denies n/v/f/c.   PCP Curtis Sites MD   Past Medical History:  Diagnosis Date   Complication of anesthesia    Hard to wake up when he was younger once.   Diabetes mellitus, type II ([place])    Heart murmur    Hypertension     Objective:  Physical Exam: Vascular: DP/PT pulses 2/4 bilateral. CFT <3 seconds. Normal hair growth on digits. No edema.  Skin. No lacerations or abrasions bilateral feet. Hyperkeratotic lesion noted sub fourth metatarsal on right and fifth on left. Nails 1-5 bilateral are thickened elongated and with subungual debris.  Musculoskeletal: MMT 5/5 bilateral lower extremities in DF, PF, Inversion and Eversion. Deceased ROM in DF of ankle joint.  Neurological: Sensation intact to light touch.   Assessment:   1. Pre-ulcerative calluses   2. Metatarsal deformity, right   3. History of amputation of right foot (HCC)   4. Pain due to onychomycosis of toenails of both feet      Plan:  Patient was evaluated and treated and all questions answered. -Discussed and educated patient on diabetic foot care, especially with  regards to the vascular, neurological and musculoskeletal systems.  -Stressed the importance of good glycemic control and the detriment of not  controlling glucose levels in relation to the foot. -Discussed supportive shoes at all times and checking feet regularly.  -Mechanically debrided all nails 1-5 bilateral using  sterile nail nipper and filed with dremel without incident  -Hyperkeratotic tissue debrided without incident.  -Answered all patient questions -Patient to return  in 3 months for at risk foot care -Patient advised to call the office if any problems or questions arise in the meantime.   Lorenda Peck, DPM

## 2022-02-22 ENCOUNTER — Other Ambulatory Visit: Payer: Self-pay

## 2022-02-22 ENCOUNTER — Ambulatory Visit
Admission: RE | Admit: 2022-02-22 | Discharge: 2022-02-22 | Disposition: A | Discharge status: Home / Self Care | Payer: BC Managed Care – PPO | Source: Ambulatory Visit | Attending: Neurology | Admitting: Neurology

## 2022-02-22 DIAGNOSIS — E0842 Diabetes mellitus due to underlying condition with diabetic polyneuropathy: Secondary | ICD-10-CM | POA: Diagnosis not present

## 2022-02-22 DIAGNOSIS — R200 Anesthesia of skin: Secondary | ICD-10-CM

## 2022-02-22 DIAGNOSIS — I639 Cerebral infarction, unspecified: Secondary | ICD-10-CM | POA: Diagnosis not present

## 2022-02-22 MED ORDER — GADOBENATE DIMEGLUMINE 529 MG/ML IV SOLN
15.0000 mL | Freq: Once | INTRAVENOUS | Status: AC | PRN
  Administered 2022-02-22: 15 mL via INTRAVENOUS

## 2022-02-27 DIAGNOSIS — R946 Abnormal results of thyroid function studies: Secondary | ICD-10-CM | POA: Diagnosis not present

## 2022-03-13 ENCOUNTER — Ambulatory Visit (INDEPENDENT_AMBULATORY_CARE_PROVIDER_SITE_OTHER): Payer: BC Managed Care – PPO | Admitting: Diagnostic Neuroimaging

## 2022-03-13 ENCOUNTER — Encounter: Payer: BC Managed Care – PPO | Admitting: Diagnostic Neuroimaging

## 2022-03-13 ENCOUNTER — Other Ambulatory Visit: Payer: Self-pay

## 2022-03-13 DIAGNOSIS — R200 Anesthesia of skin: Secondary | ICD-10-CM

## 2022-03-13 DIAGNOSIS — Z0289 Encounter for other administrative examinations: Secondary | ICD-10-CM

## 2022-03-13 DIAGNOSIS — E0842 Diabetes mellitus due to underlying condition with diabetic polyneuropathy: Secondary | ICD-10-CM

## 2022-03-13 NOTE — Procedures (Signed)
? ?[facility] ? ?NCS (NERVE CONDUCTION STUDY) WITH EMG (ELECTROMYOGRAPHY) REPORT ? ? ?STUDY DATE: 03/13/22 ?PATIENT NAME: [name] ?DOB: 09/17/1996 ?MRN: [id number] ? ?ORDERING CLINICIAN: Star Age, MD PhD  ? ?TECHNOLOGIST: Sherre Scarlet ?ELECTROMYOGRAPHER: Raeshawn Vo R. Piotr Christopher, MD ? ?CLINICAL INFORMATION: 26 year old male with bilateral foot numbness. ? ?FINDINGS: ?NERVE CONDUCTION STUDY: ? ?Bilateral peroneal and right tibial motor responses could not be obtained. ? ?Left tibial motor response has decreased amplitude and slow conduction velocity. ? ?Right ulnar motor response is normal. ? ?Left tibial motor F wave latency is prolonged.  Right tibial F-wave latency could not be obtained.  Right ulnar latency is normal. ? ?Bilateral sural and superficial peroneal sensory responses could not be obtained. ? ?Right radial and right ulnar sensory response has decreased amplitude and normal peak latency. ? ? ?NEEDLE ELECTROMYOGRAPHY: ? ?Needle examination of right lower extremity vastus medialis, tibialis anterior and gastrocnemius is normal.   ? ? ? ?IMPRESSION:  ? ?Abnormal study demonstrating: ?- Length dependent axonal sensorimotor polyneuropathy. ? ? ?INTERPRETING PHYSICIAN:  ?Penni Bombard, MD ?Certified in Neurology, Neurophysiology and Neuroimaging ? ?[facility] ?[address], [address] ?[place], [place] [address] ?[phone] ? ? ?[place] ?   ?Nerve / Sites Muscle Latency Ref. Amplitude Ref. Rel Amp Segments Distance Velocity Ref. Area  ?  ms ms mV mV %  cm m/s m/s mVms  ?R Ulnar - ADM  ?   Wrist ADM 3.1 ?3.3 9.9 ?6.0 100 Wrist - ADM 7   36.9  ?   B.Elbow ADM 7.7  8.7  87.6 B.Elbow - Wrist 24 52 ?49 33.5  ?   A.Elbow ADM 9.7  8.2  94.4 A.Elbow - B.Elbow 10 50 ?49 32.9  ?L Peroneal - EDB  ?   Ankle EDB NR ?6.5 NR ?2.0 NR Ankle - EDB 9   NR  ?   Fib head EDB NR  NR  NR Fib head - Ankle 32 NR ?44 NR  ?   Pop fossa EDB NR  NR  NR Pop fossa - Fib head 10 NR ?44 NR  ?        Pop fossa - Ankle      ?R Peroneal - EDB  ?   Ankle EDB NR ?6.5 NR ?2.0 NR Ankle - EDB 9   NR  ?   Fib head EDB NR  NR  NR Fib head - Ankle 32 NR ?44 NR  ?   Pop fossa EDB NR  NR  NR Pop fossa - Fib head 10 NR ?44 NR  ?       Pop fossa - Ankle      ?L Tibial - AH  ?   Ankle AH 4.0 ?5.8 1.8 ?4.0 100 Ankle - AH 9   4.4  ?   Pop fossa AH 17.9  1.3  71.4 Pop fossa - Ankle 44 32 ?41 3.1  ?R Tibial - AH  ?   Ankle AH NR ?5.8 NR ?4.0 NR Ankle - AH 9   NR  ?   Pop fossa AH NR  NR  NR Pop fossa - Ankle 45 NR ?41 NR  ?             ?[place] ?   ?Nerve / Sites Rec. Site Peak Lat Ref.  Amp Ref. Segments Distance  ?  ms ms ?V ?V  cm  ?R Radial - Anatomical snuff box (Forearm)  ?   Forearm  Wrist 2.5 ?2.9 9 ?15 Forearm - Wrist 10  ?L Sural - Ankle (Calf)  ?   Calf Ankle NR ?4.4 NR ?6 Calf - Ankle 14  ?R Sural - Ankle (Calf)  ?   Calf Ankle NR ?4.4 NR ?6 Calf - Ankle 14  ?L Superficial peroneal - Ankle  ?   Lat leg Ankle NR ?4.4 NR ?6 Lat leg - Ankle 14  ?R Superficial peroneal - Ankle  ?   Lat leg Ankle NR ?4.4 NR ?6 Lat leg - Ankle 14  ?R Ulnar - Orthodromic, (Dig V, Mid palm)  ?   Dig V Wrist 3.0 ?3.1 2 ?5 Dig V - Wrist 11  ?               ?F  Wave ?   ?Nerve F Lat Ref.  ? ms ms  ?L Tibial - AH 62.3 ?56.0  ?R Tibial - AH NR ?56.0  ?R Ulnar - ADM 31.7 ?32.0  ?         ?EMG Summary Table   ? Spontaneous MUAP Recruitment  ?Muscle IA Fib PSW Fasc Other Amp Dur. Poly Pattern  ?R. Vastus medialis Normal None None None _______ Normal Normal Normal Normal  ?R. Tibialis anterior Normal None None None _______ Normal Normal Normal Normal  ?R. Gastrocnemius (Medial head) Normal None None None _______ Normal Normal Normal Normal  ? ?  ?

## 2022-03-14 ENCOUNTER — Telehealth: Payer: Self-pay | Admitting: *Deleted

## 2022-03-14 NOTE — Telephone Encounter (Signed)
-----   Message from Star Age, MD sent at 03/13/2022  5:12 PM EDT ----- ?Please call patient is him that his recent electrical nerve and muscle test through our office confirms the suspicion of nerve damage, likely from his underlying diabetes.  As discussed, I would recommend ongoing strict diabetes control.  For now, I recommend he follow-up with his endocrinologist (diabetes specialist) and primary care physician closely. ?

## 2022-03-14 NOTE — Telephone Encounter (Signed)
Spoke with patient and discussed his EMG/NCV results which confirms nerve damage, likely from his underlying diabetes. Advised of Dr Guadelupe Sabin recommendation to have strict diabetes control and follow-up with his endocrinologist and primary care doctor. Pt verbalized understanding and did not have any questions at the time of the call. Encouraged pt to call with any questions or concerns if they arise. He verbalized appreciation for the call.  ?

## 2022-03-27 ENCOUNTER — Encounter: Payer: Self-pay | Admitting: Internal Medicine

## 2022-03-27 ENCOUNTER — Ambulatory Visit: Payer: BC Managed Care – PPO | Admitting: Internal Medicine

## 2022-03-27 VITALS — BP 130/82 | HR 109 | Ht 76.0 in | Wt 180.2 lb

## 2022-03-27 DIAGNOSIS — R7989 Other specified abnormal findings of blood chemistry: Secondary | ICD-10-CM

## 2022-03-27 DIAGNOSIS — Z91199 Patient's noncompliance with other medical treatment and regimen due to unspecified reason: Secondary | ICD-10-CM

## 2022-03-27 DIAGNOSIS — E1165 Type 2 diabetes mellitus with hyperglycemia: Secondary | ICD-10-CM

## 2022-03-27 DIAGNOSIS — E1142 Type 2 diabetes mellitus with diabetic polyneuropathy: Secondary | ICD-10-CM

## 2022-03-27 LAB — POCT GLYCOSYLATED HEMOGLOBIN (HGB A1C): Hemoglobin A1C: 6.9 % — AB (ref 4.0–5.6)

## 2022-03-27 MED ORDER — LANTUS SOLOSTAR 100 UNIT/ML ~~LOC~~ SOPN: 65.0000 [IU] | PEN_INJECTOR | Freq: Every day | SUBCUTANEOUS | 3 refills | Status: DC

## 2022-03-27 NOTE — Progress Notes (Signed)
Patient ID: [name], male   DOB: [birth year], 26 y.o.   MRN: [id number]  ? ?This visit occurred during the SARS-CoV-2 public health emergency.  Safety protocols were in place, including screening questions prior to the visit, additional usage of staff PPE, and extensive cleaning of exam room while observing appropriate contact time as indicated for disinfecting solutions.  ? ?HPI: ?[name] is a 26 y.o.-year-old male, initially referred by his PCP, Dr. Angelia Mould returning for follow-up for DM, dx at 26 years old, insulin-dependent since 27 y/o, uncontrolled, with complications (autonomic neuropathy, PN, microalbuminuria, DR).  She saw Dr. Dorris Fetch in the past.  I saw the patient for 1 visit in 09/2020.  He was lost for follow-up afterwards.  He is here with his mother who offers part of the history, especially regarding his past medical history, medication regimen and blood sugars. ? ?Interim history: ?No increased urination, blurry vision, nausea, chest pain.  He did lose weight since last visit.  He mentions he is not hungry. ?Since last visit he has multiple ED visits and admissions. ?He has a history of right foot ulcer, with subsequent osteomyelitis and is s/p amputation of the right fifth toe. ?He recently had evaluation by neurology and was advised to continue to see endocrinology for his diabetes care. ? ?Reviewed HbA1c: ?01/10/2022: HbA1c 6.5% ?Lab Results  ?Component Value Date  ? HGBA1C 10.5 (H) 05/20/2021  ? HGBA1C 11.4 (A) 10/18/2020  ? HGBA1C 9.7 (H) 03/17/2019  ? HGBA1C 8.6 (H) 11/18/2018  ? HGBA1C 12.5 08/14/2018  ? HGBA1C 13 02/11/2017  ?08/2020: HbA1c 12.1% ?04/2020: HbA1c 11.5% ? ?At last visit, he was on: ?- Metformin ER 500 mg 2x a day, with meals ?- Victoza 1.2 mg daily in am - started by PCP on 09/28/2020 - no nausea ?- Lantus 77 units at bedtime >> split 35 units 2x a day on 09/28/2020 ?He was on Humalog 7-7-9 units before meals  - stopped when started Victoza ?Metformin IR >>  nausea. ? ?I recommended the following regimen: ?-  >> stomach pain and nausea >> stopped ?- Farxiga 10 before b'fast  -started 10/2021 ?- Victoza >> Ozempic 0.5 >> 1 >> 2 mg weekly (increased in 10/2021) ?- Lantus 35 >> 32-34 units 2x a day ?-  >> stopped in 10/2021 2/2 low blood sugars ? ?Pt checks his sugars 2-3x a day: ?- am: 300-317 >> 140-160, 185 ?- 2h after b'fast: n/c ?- before lunch: 250 >> 120-140 ?- 2h after lunch: n/c ?- before dinner: 250 >> 140-160 ?- 2h after dinner: n/c ?- bedtime: 200-310 >> n/c ?- nighttime: n/c ?Lowest sugar was 200 >> 40 (with Humalog), 110; ?  At which level he has hypoglycemia awareness. ?Highest sugar was 350 >> 210. ? ?Glucometer: Accu-Chek >> ReliOn  ? ?Pt's meals are: ?- Breakfast: if eats b'fast: cornflakes + milk ?- Lunch: half a sandwich ?- Dinner: meat + veggies + starch - home cooked ?- Snacks: not usually ?He drinks 1.5-2 sweet tea a day. ? ?- no CKD but he has a history of microalbuminuria, last BUN/creatinine:  ?Lab Results  ?Component Value Date  ? BUN 10 02/08/2022  ? BUN 10 10/17/2021  ? CREATININE 0.84 02/08/2022  ? CREATININE 0.67 10/17/2021  ? ?ACR: ?07/07/2022: ACR 13 ?10/19/2015: ACR 78.8 ?07/28/2012: ACR 41.8 ?Not on ACE inhibitor/ARB. ? ?-+ HL; last set of lipids: ?07/07/2021: 106/152/33/50 ?10/19/2015: 202/110/63/117 ?No results found for: CHOL, HDL, LDLCALC, LDLDIRECT, TRIG, CHOLHDL ?On Lipitor 10. ? ?- last  eye exam was in Spring 2021. + DR reportedly. ? ?- + numbness and tingling in his feet.  He has peripheral neuropathy for which she sees podiatry and neurology.   ?He had EMG/NCV: ?Message from Star Age, MD sent at 03/13/2022  5:12 PM EDT ----- ?Please call patient is him that his recent electrical nerve and muscle test through our office confirms the suspicion of nerve damage, likely from his underlying diabetes.  As discussed, I would recommend ongoing strict diabetes control.  For now, I recommend he follow-up with his endocrinologist (diabetes  specialist) and primary care physician closely. ? ?Prev. On Reglan. ?On ASA 81. ? ?Pt has FH of DM in MGM, MGGM, PGF - prediabetes. ? ?He also has a history of HTN, cardiac murmur.  He had tachycardia and palpitations due to autoimmune.  He was started on carvedilol after an ED visit in 2019.  He continued to have shortness of breath and exertional fatigue. Stress test, 2D Echo and heart monitor >> normal in 2021.   ? ?He was recently found to have slightly low  TSH levels: ?Lab Results  ?Component Value Date  ? TSH 0.317 (L) 02/08/2022  ? TSH 0.277 (L) 06/07/2021  ? TSH 0.62 03/17/2019  ? TSH 1.02 11/18/2018  ? TSH 0.517 08/26/2018  ? TSH 0.41 08/14/2018  ? TSH 0.658 08/10/2018  ?This is currently being investigated by PCP.  He mentions that for the first set of labs, he was on vitamins with biotin, but not for the second.  He has repeat labs coming up with PCP soon. ? ?He is unemployed.  He walks for exercise but only can walk for 15 to 20 minutes because of shortness of breath. ? ?ROS: ?+ see HPI ?+ weight loss, + fatigue ? + muscle, + joint aches ? ?Past Medical History:  ?Diagnosis Date  ? Complication of anesthesia   ? Hard to wake up when he was younger once.  ? Diabetes mellitus, type II ([place])   ? Heart murmur   ? Hypertension   ? ?Past Surgical History:  ?Procedure Laterality Date  ? AMPUTATION Right 05/21/2021  ? Procedure: AMPUTATION RAY 5th;  Surgeon: Trula Slade, DPM;  Location: [facility];  Service: Podiatry;  Laterality: Right;  ? PALATE / UVULA BIOPSY / EXCISION    ? "growth removed"  ? UPPER GASTROINTESTINAL ENDOSCOPY    ? Done in [place] - 2013 ish  ? WOUND DEBRIDEMENT Right 05/24/2021  ? Procedure: RIGHT FOOT WOUND DEBRIDEMENT AND CLOSURE;  Surgeon: Evelina Bucy, DPM;  Location: [facility];  Service: Podiatry;  Laterality: Right;  ? ?Social History  ? ?Socioeconomic History  ? Marital status: Single  ?  Spouse name: Not on file  ? Number of children: Not on file  ? Years of education: Not on  file  ? Highest education level: Not on file  ?Occupational History  ? Not on file  ?Tobacco Use  ? Smoking status: Never  ?  Passive exposure: Yes  ? Smokeless tobacco: Never  ?Vaping Use  ? Vaping Use: Never used  ?Substance and Sexual Activity  ? Alcohol use: No  ? Drug use: No  ? Sexual activity: Not on file  ?Other Topics Concern  ? Not on file  ?Social History Narrative  ? Not on file  ? ?Social Determinants of Health  ? ?Financial Resource Strain: Not on file  ?Food Insecurity: Not on file  ?Transportation Needs: Not on file  ?Physical Activity: Not  on file  ?Stress: Not on file  ?Social Connections: Not on file  ?Intimate Partner Violence: Not on file  ? ?Current Outpatient Medications on File Prior to Visit  ?Medication Sig Dispense Refill  ? acetaminophen (TYLENOL) 500 MG tablet Take 1,000 mg by mouth every 6 (six) hours as needed for moderate pain or headache.    ? aspirin EC 81 MG tablet Take 1 tablet (81 mg total) by mouth daily.    ? atorvastatin (LIPITOR) 10 MG tablet Take 1 tablet (10 mg total) by mouth daily. 30 tablet 2  ? blood glucose meter kit and supplies KIT Dispense based on patient and insurance preference. Use up to four times daily as directed. (FOR ICD-10 E11.65). 1 each 5  ? carvedilol (COREG) 25 MG tablet Take 1 tablet (25 mg total) by mouth 2 (two) times daily. 60 tablet 6  ? clindamycin (CLEOCIN) 150 MG capsule Take 1 capsule (150 mg total) by mouth 2 (two) times daily. 6 capsule 0  ? FARXIGA 10 MG TABS tablet Take 10 mg by mouth daily.    ? hydrOXYzine (ATARAX/VISTARIL) 50 MG tablet Take 50 mg by mouth in the morning and at bedtime.    ? ibuprofen (ADVIL) 800 MG tablet TAKE 1 TABLET BY MOUTH EVERY 8 HOURS AS NEEDED 90 tablet 0  ? insulin glargine (LANTUS SOLOSTAR) 100 UNIT/ML Solostar Pen Inject 35 Units into the skin 2 (two) times daily. 45 mL 3  ? insulin lispro (HUMALOG KWIKPEN) 200 UNIT/ML KwikPen Inject under skin 10-14 units 3x a day before meals 45 mL 3  ? Insulin Pen Needle  (CARETOUCH PEN NEEDLES) 31G X 6 MM MISC Use 6x a day 400 each 3  ? isosorbide mononitrate (IMDUR) 30 MG 24 hr tablet Take 1 tablet (30 mg total) by mouth daily. 30 tablet 2  ? [employer] (ACCU-CHEK [employer]

## 2022-03-27 NOTE — Patient Instructions (Addendum)
Please continue: ?- Ozempic 2 mg weekly ?- Farxiga 10 mg before b'fast ? ?Please change: ?- Lantus 25 units in am and 40 units at night ? ?Please return in 3-4 months with your sugar log.  ?

## 2022-03-29 ENCOUNTER — Encounter: Payer: BC Managed Care – PPO | Admitting: Diagnostic Neuroimaging

## 2022-04-05 ENCOUNTER — Encounter: Payer: Self-pay | Admitting: Internal Medicine

## 2022-04-11 DIAGNOSIS — E1165 Type 2 diabetes mellitus with hyperglycemia: Secondary | ICD-10-CM | POA: Diagnosis not present

## 2022-04-11 DIAGNOSIS — Z0000 Encounter for general adult medical examination without abnormal findings: Secondary | ICD-10-CM | POA: Diagnosis not present

## 2022-04-11 DIAGNOSIS — Z79899 Other long term (current) drug therapy: Secondary | ICD-10-CM | POA: Diagnosis not present

## 2022-04-11 DIAGNOSIS — Z23 Encounter for immunization: Secondary | ICD-10-CM | POA: Diagnosis not present

## 2022-04-11 DIAGNOSIS — I10 Essential (primary) hypertension: Secondary | ICD-10-CM | POA: Diagnosis not present

## 2022-04-11 DIAGNOSIS — R011 Cardiac murmur, unspecified: Secondary | ICD-10-CM | POA: Diagnosis not present

## 2022-04-11 DIAGNOSIS — R946 Abnormal results of thyroid function studies: Secondary | ICD-10-CM | POA: Diagnosis not present

## 2022-04-23 ENCOUNTER — Encounter: Payer: Self-pay | Admitting: Internal Medicine

## 2022-04-30 ENCOUNTER — Telehealth: Payer: Self-pay | Admitting: Podiatry

## 2022-04-30 NOTE — Telephone Encounter (Signed)
error 

## 2022-05-12 ENCOUNTER — Encounter: Payer: Self-pay | Admitting: Internal Medicine

## 2022-05-14 ENCOUNTER — Other Ambulatory Visit: Payer: Self-pay | Admitting: Internal Medicine

## 2022-05-14 MED ORDER — GLUCAGON 3 MG/DOSE NA POWD: 3.0000 mg | Freq: Once | NASAL | 11 refills | Status: DC | PRN

## 2022-05-14 MED ORDER — GVOKE HYPOPEN 1-PACK 1 MG/0.2ML ~~LOC~~ SOAJ: SUBCUTANEOUS | 99 refills | Status: DC

## 2022-05-22 ENCOUNTER — Ambulatory Visit (INDEPENDENT_AMBULATORY_CARE_PROVIDER_SITE_OTHER): Payer: Self-pay | Admitting: Podiatry

## 2022-05-22 DIAGNOSIS — Z91199 Patient's noncompliance with other medical treatment and regimen due to unspecified reason: Secondary | ICD-10-CM

## 2022-05-22 NOTE — Progress Notes (Signed)
No show

## 2022-05-29 ENCOUNTER — Other Ambulatory Visit: Payer: Self-pay | Admitting: Internal Medicine

## 2022-05-29 ENCOUNTER — Telehealth: Payer: Self-pay | Admitting: Pharmacy Technician

## 2022-05-29 ENCOUNTER — Other Ambulatory Visit (HOSPITAL_COMMUNITY): Payer: Self-pay

## 2022-05-29 NOTE — Telephone Encounter (Signed)
We do not  still need to call in the Baqsimi, right?

## 2022-05-29 NOTE — Telephone Encounter (Signed)
Patient Advocate Encounter   Received notification from CoverMyMeds that prior authorization for GVoke is required by his/her insurance [employer].  Per Test Claim: Baqsimi is $25

## 2022-06-14 ENCOUNTER — Telehealth: Payer: Self-pay | Admitting: Podiatry

## 2022-06-14 NOTE — Telephone Encounter (Signed)
Sammy called back to get the main fax number to refax the paperwork, he was faxing it today 06/14/22 to the [facility] office  [phone]

## 2022-06-14 NOTE — Telephone Encounter (Signed)
I have received a message earlier this week regarding a functional capacity questioner. I called Sammy back as I did not receive it and he resent it. However, I still have not received it. After review of the chart I have not seen this patient since I did the first surgery. Dr. March Rummage did the second surgery and followed up with him. He is no longer with our practice. Dr. Blenda Mounts saw him last. I have called Sammy back to let him know this and left him a voicemail.    Received called from [facility] at [place] ext [phone]   They need the residual function capacity questionnaire filled out and sent back to the asap.   Form was faxed Apr 25, 2022 that needs to be filled out , for his hearing on June 30th, 2023

## 2022-06-16 ENCOUNTER — Other Ambulatory Visit: Payer: Self-pay | Admitting: Orthopedic Surgery

## 2022-06-16 DIAGNOSIS — M25361 Other instability, right knee: Secondary | ICD-10-CM

## 2022-06-16 DIAGNOSIS — M25561 Pain in right knee: Secondary | ICD-10-CM

## 2022-06-19 ENCOUNTER — Telehealth: Payer: Self-pay | Admitting: *Deleted

## 2022-06-20 ENCOUNTER — Other Ambulatory Visit: Payer: Self-pay | Admitting: Podiatry

## 2022-06-20 DIAGNOSIS — Z89431 Acquired absence of right foot: Secondary | ICD-10-CM

## 2022-06-20 DIAGNOSIS — G4700 Insomnia, unspecified: Secondary | ICD-10-CM | POA: Diagnosis not present

## 2022-06-20 DIAGNOSIS — F419 Anxiety disorder, unspecified: Secondary | ICD-10-CM | POA: Diagnosis not present

## 2022-06-20 DIAGNOSIS — M21961 Unspecified acquired deformity of right lower leg: Secondary | ICD-10-CM

## 2022-06-20 NOTE — Telephone Encounter (Signed)
Thank you :)

## 2022-06-20 NOTE — Telephone Encounter (Signed)
The functional capacity evaluation typically needs to be done by a physical therapist. I put in a referral for this to be done for him. Thanks

## 2022-06-24 MED ORDER — IBUPROFEN 800 MG PO TABS: 800.0000 mg | ORAL_TABLET | Freq: Three times a day (TID) | ORAL | 0 refills | Status: DC | PRN

## 2022-06-27 DIAGNOSIS — R1032 Left lower quadrant pain: Secondary | ICD-10-CM | POA: Diagnosis not present

## 2022-06-27 DIAGNOSIS — R195 Other fecal abnormalities: Secondary | ICD-10-CM | POA: Diagnosis not present

## 2022-07-09 DIAGNOSIS — D72829 Elevated white blood cell count, unspecified: Secondary | ICD-10-CM | POA: Diagnosis not present

## 2022-07-09 DIAGNOSIS — R634 Abnormal weight loss: Secondary | ICD-10-CM | POA: Diagnosis not present

## 2022-07-09 DIAGNOSIS — R1032 Left lower quadrant pain: Secondary | ICD-10-CM | POA: Diagnosis not present

## 2022-07-12 DIAGNOSIS — I10 Essential (primary) hypertension: Secondary | ICD-10-CM | POA: Diagnosis not present

## 2022-07-12 DIAGNOSIS — R1032 Left lower quadrant pain: Secondary | ICD-10-CM | POA: Diagnosis not present

## 2022-07-17 ENCOUNTER — Ambulatory Visit (INDEPENDENT_AMBULATORY_CARE_PROVIDER_SITE_OTHER): Payer: BC Managed Care – PPO | Admitting: Internal Medicine

## 2022-07-17 ENCOUNTER — Encounter: Payer: Self-pay | Admitting: Internal Medicine

## 2022-07-17 VITALS — BP 100/70 | HR 113 | Resp 20 | Ht 75.0 in | Wt 170.0 lb

## 2022-07-17 DIAGNOSIS — Z794 Long term (current) use of insulin: Secondary | ICD-10-CM

## 2022-07-17 DIAGNOSIS — E119 Type 2 diabetes mellitus without complications: Secondary | ICD-10-CM

## 2022-07-17 DIAGNOSIS — R000 Tachycardia, unspecified: Secondary | ICD-10-CM | POA: Diagnosis not present

## 2022-07-17 DIAGNOSIS — I951 Orthostatic hypotension: Secondary | ICD-10-CM | POA: Diagnosis not present

## 2022-07-17 MED ORDER — METOPROLOL SUCCINATE ER 100 MG PO TB24: 100.0000 mg | ORAL_TABLET | Freq: Every day | ORAL | 3 refills | Status: DC

## 2022-07-17 NOTE — Progress Notes (Signed)
Cardiology Office Note  Date: 07/17/2022   ID: [name], DOB 07/02/1996, MRN [id number]  PCP:  [name]  Cardiologist:  None Electrophysiologist:  None   Chief Complaint: 42-monthfollow-up  History of Present Illness: Lucas GARBERis a 26y.o. male with a history of palpitations, tachycardia, DM type II, hypertension  Patient last seen by Dr. KBronson Ingon 09/08/2018 for palpitations at the request of [name].  [facility]  He was tachycardic with a heart rate of 121.  He was normotensive with normal oxygen saturations.  Had a reported history of noncompliance with medical therapy.  He had  previously been evaluated in the ED for palpitations on August 10, 2018.  He stated his heart always races but sometimes it feels worse.  His mother described his whole body shaking.  Blood sugar was 106 that morning.  He was having occasional bilateral chest soreness.  He denied any lightheadedness, dizziness, or syncope.  [name] the tachycardia and palpitations to be diabetes mediated autonomic neuropathy.  He was taking hydrochlorothiazide for hypertension.  This was discontinued and he was started on carvedilol 3.125 mg p.o. twice daily.  He was being followed by endocrinology for his diabetes.  He was seen by me at last visit for preop clearance to undergo surgery on his right knee.  Since that time he has had a right fifth toe amputation with a right foot debridement and closure by Dr. WEarleen Brown  He states the orthopedic surgeon wants his hemoglobin A1c below 7% before he decides to proceed with surgery on his right knee.  He has had some recent issues with what he describes as strokelike symptoms in July and presented to ED.  He had imaging with CT of the head as well as an MRI of the brain.  CT of the head was performed from complaint of left arm tingling.  There was note acute  intracranial abnormality.  MRI of the brain performed due to neurodeficit, stroke suspected, left-sided paresthesias which had resolved.  MRI was negative.  He states he has episodes where he feels like he feels dizzy.  He states sometimes his blood sugar can drop into the 40s and 50s but generally stays much higher.  He denies any current anginal or exertional symptoms, orthostatic symptoms, CVA or TIA-like symptoms, DOE or SOB, PND, orthopnea.  No bleeding issues, claudication-like symptoms, DVT or PE-like symptoms, or lower extremity edema.  07/17/2022  [name] seen for follow-up.  He is thought to have a degree of autonomic neuropathy with a history of poorly controlled diabetes however recently he has had 50 pound weight loss and his A1c is down to 6.5%.  With this weight loss he has had difficulty with positional dizziness and orthostasis.  Blood pressure today was 100/70.  He was recently advised to cut his losartan in half.  He was also told to reduce the dose of his Imdur.  The reason for being on Imdur is not clear.  He had a stress test which was deemed abnormal in 2021 however never had any functional imaging or catheterization to confirm this.  He denies any chest pain symptoms.  He is on high-dose carvedilol but despite this is tachycardic with a heart rate of 113 today.   Past Medical History:  Diagnosis Date   Complication of anesthesia    Hard to wake up when  he was younger once.   Diabetes mellitus, type II ([place])    Heart murmur    Hypertension     Past Surgical History:  Procedure Laterality Date   AMPUTATION Right 05/21/2021   Procedure: AMPUTATION RAY 5th;  Surgeon: [name], DPM;  Location: [facility];  Service: Podiatry;  Laterality: Right;   PALATE / UVULA BIOPSY / EXCISION     "growth removed"   UPPER GASTROINTESTINAL ENDOSCOPY     Done in [place] - 2013 ish   WOUND DEBRIDEMENT Right 05/24/2021   Procedure: RIGHT FOOT WOUND DEBRIDEMENT AND CLOSURE;   Surgeon: [name], DPM;  Location: [facility];  Service: Podiatry;  Laterality: Right;    Current Outpatient Medications  Medication Sig Dispense Refill   acetaminophen (TYLENOL) 500 MG tablet Take 1,000 mg by mouth every 6 (six) hours as needed for moderate pain or headache.     aspirin EC 81 MG tablet Take 1 tablet (81 mg total) by mouth daily.     atorvastatin (LIPITOR) 10 MG tablet Take 1 tablet by mouth daily.     blood glucose meter kit and supplies KIT Dispense based on patient and insurance preference. Use up to four times daily as directed. (FOR ICD-10 E11.65). 1 each 5   carvedilol (COREG) 25 MG tablet Take 1 tablet (25 mg total) by mouth 2 (two) times daily. 60 tablet 6   FARXIGA 10 MG TABS tablet Take 10 mg by mouth daily.     GVOKE HYPOPEN 1-PACK 1 MG/0.2ML SOAJ Inject 1 mg under skin as needed for hypoglycemia 0.2 mL prn   hydrOXYzine (ATARAX/VISTARIL) 50 MG tablet Take 50 mg by mouth in the morning and at bedtime.     ibuprofen (ADVIL) 800 MG tablet Take 1 tablet (800 mg total) by mouth every 8 (eight) hours as needed. 90 tablet 0   insulin glargine (LANTUS SOLOSTAR) 100 UNIT/ML Solostar Pen Inject 65 Units into the skin daily. 45 mL 3   Insulin Pen Needle (CARETOUCH PEN NEEDLES) 31G X 6 MM MISC Use 6x a day 400 each 3   Lancet Devices (ACCU-CHEK SOFTCLIX) lancets Use as instructed 4 x daily. e11.65 150 each 5   Lancets (ACCU-CHEK SOFT TOUCH) lancets Use as instructed 100 each 12   losartan (COZAAR) 25 MG tablet Take 25 mg by mouth daily.     Multiple Vitamin (MULTIVITAMIN WITH MINERALS) TABS tablet Take 1 tablet by mouth daily. 30 tablet 0   Naphazoline HCl (CLEAR EYES OP) Place 1 drop into the left eye daily as needed (dry eyes).     ondansetron (ZOFRAN ODT) 4 MG disintegrating tablet Take 1 tablet (4 mg total) by mouth every 8 (eight) hours as needed for nausea or vomiting. 20 tablet 0   OVER THE COUNTER MEDICATION Place 4-5 drops into the left ear daily as needed (ringing  in ear). Ear ringing medication     Semaglutide, 2 MG/DOSE, (OZEMPIC, 2 MG/DOSE,) 8 MG/3ML SOPN INJECT 2MG  SUBCUTANEOUSLY ONCE A WEEK     silver sulfADIAZINE (SILVADENE) 1 % cream Apply pea-sized amount to wound daily. 50 g 0   zolpidem (AMBIEN) 10 MG tablet Take 10 mg by mouth at bedtime.     isosorbide mononitrate (IMDUR) 30 MG 24 hr tablet Take 1 tablet (30 mg total) by mouth daily. (Patient taking differently: Take 15 mg by mouth daily.) 30 tablet 2   nitroGLYCERIN (NITROSTAT) 0.4 MG SL tablet Place 1 tablet (0.4 mg total) under the tongue  every 5 (five) minutes as needed for chest pain. 25 tablet 3   No current facility-administered medications for this visit.   Allergies:  Amoxicillin and Lexapro [escitalopram]   Social History: The patient  reports that he has never smoked. He has been exposed to tobacco smoke. He has never used smokeless tobacco. He reports that he does not drink alcohol and does not use drugs.   Family History: The patient's family history includes Diabetes in his maternal grandmother; Healthy in his father, mother, sister, and sister; Hypertension in his maternal grandmother; Neuropathy in his maternal grandmother; Thyroid disease in his maternal grandmother.   ROS:    Pertinent items noted in HPI and remainder of comprehensive ROS otherwise negative.   Physical Exam: VS:  BP 100/70 (BP Location: Left Arm, Patient Position: Sitting, Cuff Size: Normal)   Pulse (!) 113   Resp 20   Ht 6' 3"  (1.905 m)   Wt 170 lb (77.1 kg)   SpO2 99%   BMI 21.25 kg/m , BMI Body mass index is 21.25 kg/m.  Wt Readings from Last 3 Encounters:  07/17/22 170 lb (77.1 kg)  03/27/22 180 lb 3.2 oz (81.7 kg)  02/08/22 181 lb (82.1 kg)    General appearance: alert, no distress, and thin Neck: no carotid bruit, no JVD, and thyroid not enlarged, symmetric, no tenderness/mass/nodules Lungs: clear to auscultation bilaterally Heart: regular tachycardia Abdomen: soft, non-tender; bowel  sounds normal; no masses,  no organomegaly Extremities: extremities normal, atraumatic, no cyanosis or edema and right 5th ray amputation Pulses: 2+ and symmetric Skin: Skin color, texture, turgor normal. No rashes or lesions Neurologic: Mental status: Alert, oriented, thought content appropriate Psych: Pleasant   ECG:   Sinus tachycardia at 113, right atrial enlargement, low voltage QRS-personally reviewed  Recent Labwork: 10/17/2021: Hemoglobin 15.2; Platelets 343 02/08/2022: ALT 14; AST 20; BUN 10; Creatinine, Ser 0.84; Potassium 5.3; Sodium 140; TSH 0.317  No results found for: "CHOL", "TRIG", "HDL", "CHOLHDL", "VLDL", "LDLCALC", "LDLDIRECT"  Other Studies Reviewed Today:   Nuclear stress test 09/06/2020  Narrative & Impression  There was no ST segment deviation noted during stress. Findings consistent with prior inferior/inferoseptal/inferoapical myocardial infarction with mild to moderate peri-infarct ischemia. Mild anterior ischemia This is an intermediate risk study. The left ventricular ejection fraction is hyperdynamic (>65%). Abnormal stress test in young patient, of note chart review shows Hgb A1c over the last 3 years 9-13 raising possibility for true onset of early coronary artery disease.      09/06/2020 Echocardiogram:  1. There is mild chordal SAM in the setting of hyperdynamic LV with mild subvalvular gradient of 17 mmHg. No significant LV hypertrophy. . Left ventricular ejection fraction, by estimation, is 65 to 70%. The left ventricle has normal function. The left ventricle has no regional wall motion abnormalities. Left ventricular diastolic parameters were normal. 2. Right ventricular systolic function is normal. The right ventricular size is normal. 3. The mitral valve is normal in structure. No evidence of mitral valve regurgitation. No evidence of mitral stenosis. 4. The aortic valve is tricuspid. Aortic valve regurgitation is not visualized. No aortic  stenosis is present. 5. The inferior vena cava is normal in size with greater than 50% respiratory variability, suggesting right atrial pressure of 3 mmHg.  Assessment and Plan:  Mr. Rossano has not had any anginal symptoms.  He did have an abnormal stress test in 2021 however I suspect it could be a false positive.  He is now struggling with hypotension and  positional dizziness after 50 pound weight loss on a number of medications which lower blood pressure.  I advised him to stop both losartan and Imdur.  In addition, since he is tachycardic on high-dose carvedilol (a poor medication for rate control), I would advise switching to Toprol XL 100 mg at night.  This should allow little higher blood pressure and hopefully give him better heart rate control.  Plan follow-up with APP in [place] in 6 months.   Pixie Casino, MD, [facility], [place] Director of the Advanced Lipid Disorders &  Cardiovascular Risk Reduction Clinic Diplomate of the American Board of Clinical Lipidology Attending Cardiologist  Direct Dial: [phone]  Fax: [phone]  Website:  [email or web address]

## 2022-07-17 NOTE — Patient Instructions (Signed)
Medication Instructions:  Your physician has recommended you make the following change in your medication:   Stop Taking Imdur  Stop Taking Losartan   Start Taking Toprol XL 100 mg Daily at night   *If you need a refill on your cardiac medications before your next appointment, please call your pharmacy*   Lab Work: NONE   If you have labs (blood work) drawn today and your tests are completely normal, you will receive your results only by: [facility] (if you have MyChart) OR A paper copy in the mail If you have any lab test that is abnormal or we need to change your treatment, we will call you to review the results.   Testing/Procedures: NONE    Follow-Up: At [facility], you and your health needs are our priority.  As part of our continuing mission to provide you with exceptional heart care, we have created designated Provider Care Teams.  These Care Teams include your primary Cardiologist (physician) and Advanced Practice Providers (APPs -  Physician Assistants and Nurse Practitioners) who all work together to provide you with the care you need, when you need it.  We recommend signing up for the patient portal called "MyChart".  Sign up information is provided on this After Visit Summary.  MyChart is used to connect with patients for Virtual Visits (Telemedicine).  Patients are able to view lab/test results, encounter notes, upcoming appointments, etc.  Non-urgent messages can be sent to your provider as well.   To learn more about what you can do with MyChart, go to [email or web address].    Your next appointment:   6 month(s)  The format for your next appointment:   In Person  Provider:   You may see None or one of the following Advanced Practice Providers on your designated Care Team:   Bernerd Pho, PA-C  Ermalinda Barrios, PA-C     Other Instructions Thank you for choosing [facility]!    Important Information About Sugar

## 2022-07-24 DIAGNOSIS — I959 Hypotension, unspecified: Secondary | ICD-10-CM | POA: Diagnosis not present

## 2022-07-24 DIAGNOSIS — R634 Abnormal weight loss: Secondary | ICD-10-CM | POA: Diagnosis not present

## 2022-07-24 DIAGNOSIS — I10 Essential (primary) hypertension: Secondary | ICD-10-CM | POA: Diagnosis not present

## 2022-07-25 DIAGNOSIS — F419 Anxiety disorder, unspecified: Secondary | ICD-10-CM | POA: Diagnosis not present

## 2022-07-25 DIAGNOSIS — G4700 Insomnia, unspecified: Secondary | ICD-10-CM | POA: Diagnosis not present

## 2022-07-30 NOTE — Progress Notes (Signed)
Patient ID: [name], male   DOB: [birth year], 26 y.o.   MRN: [id number]   HPI: [name] is a 26 y.o.-year-old male, initially referred by his PCP, Dr. Angelia Mould returning for follow-up for DM, dx at 26 years old, insulin-dependent since 26 y/o, uncontrolled, with complications (autonomic neuropathy, PN, microalbuminuria, DR).  She saw Dr. Dorris Fetch in the past. He is here with his mother who offers part of the history, especially regarding his past medical history, medication regimen and blood sugars.  Last visit 4 months ago.  Interim history: No increased urination, blurry vision, nausea, chest pain.  He did lose weight since last visit.  He mentions he is not hungry. PCP took him off Ozempic last week 2/2 decreased appetite. His blood pressure improved since last visit so he was advised to stop Carvedilol, ISDN and now on Metoprolol.  Reviewed HbA1c: Lab Results  Component Value Date   HGBA1C 6.9 (A) 03/27/2022   HGBA1C 10.5 (H) 05/20/2021   HGBA1C 11.4 (A) 10/18/2020   HGBA1C 9.7 (H) 03/17/2019   HGBA1C 8.6 (H) 11/18/2018   HGBA1C 12.5 08/14/2018   HGBA1C 13 02/11/2017  01/10/2022: HbA1c 6.5% 08/2020: HbA1c 12.1% 04/2020: HbA1c 11.5%  Previously on: - Metformin ER 500 mg 2x a day, with meals - Victoza 1.2 mg daily in am - started by PCP on 09/28/2020 - no nausea - Lantus 77 units at bedtime >> split 35 units 2x a day on 09/28/2020 He was on Humalog 7-7-9 units before meals  - stopped when started Victoza Metformin IR >> nausea.  I recommended the following regimen: - Farxiga 10 before b'fast  -started 10/2021 -  >> off starting 07/24/2022. - Lantus 35 >> 32-34 units 2x a day >> 25 units in a.m. and 40 units at night >> 20 units in am and 26 units at night He was previously on metformin but developed nausea and stomach pain. He was previously on Humalog but this was stopped 10/2021 due to low blood sugars. Previously on Victoza, but this was stopped when switching  to Ozempic.  Pt checks his sugars 2-3x a day: (30 day ave 81): - am: 300-317 >> 140-160, 185 >> 40s-87, 93, 120 - 2h after b'fast: n/c - before lunch: 250 >> 120-140 >> 40s-80s - 2h after lunch: n/c - before dinner: 250 >> 140-160 >> 40s-80s - 2h after dinner: n/c - bedtime: 200-310 >> n/c - nighttime: n/c Lowest sugar was 200 >> 40 (with Humalog), 110 >> 40; ?  At which level he has hypoglycemia awareness. Highest sugar was 350 >> 210 >> 120.  Glucometer: Accu-Chek >> ReliOn   Pt's meals are: - Breakfast: if eats b'fast: cornflakes + milk - Lunch: half a sandwich - Dinner: meat + veggies + starch - home cooked - Snacks: not usually He drinks 1.5-2 sweet tea a day.  - no CKD but he has a history of microalbuminuria, last BUN/creatinine:  COMPREHENSIVE METABOLIC PANEL   2130-86-57    GLUCOSE 142   65-99  UREA NITROGEN (BUN) 18   7-25  CREATININE 0.98   0.60-1.24  BUN/CREATININE RATIO NOT APPLICABLE   8-46  SODIUM 142   135-146  POTASSIUM 4.4   3.5-5.3  CHLORIDE 101   98-110  CARBON DIOXIDE 25   20-32  CALCIUM 10.8   8.6-10.3  PROTEIN, TOTAL 8.6   6.1-8.1  ALBUMIN 4.9   3.6-5.1  GLOBULIN 3.7   1.9-3.7  ALBUMIN/GLOBULIN RATIO 1.3   1.0-2.5  BILIRUBIN, TOTAL 1.4  0.2-1.2  ALKALINE PHOSPHATASE 77   36-130  AST 14   10-40  ALT 14   9-46  EGFR 110   > OR = 60   Lab Results  Component Value Date   BUN 10 02/08/2022   BUN 10 10/17/2021   CREATININE 0.84 02/08/2022   CREATININE 0.67 10/17/2021   ACR: 07/07/2022: ACR 13 10/19/2015: ACR 78.8 07/28/2012: ACR 41.8 Not on ACE inhibitor/ARB.  -+ HL; last set of lipids: 07/07/2021: 106/152/33/50 10/19/2015: 202/110/63/117 No results found for: "CHOL", "HDL", "LDLCALC", "LDLDIRECT", "TRIG", "CHOLHDL" On Lipitor 10.  - last eye exam was in Spring 2021. + DR reportedly. Coming up next week.  - + numbness and tingling in his feet.  He has peripheral neuropathy for which she sees podiatry and neurology.   He had  EMG/NCV: Message from Star Age, MD sent at 03/13/2022  5:12 PM EDT ----- Please call patient is him that his recent electrical nerve and muscle test through our office confirms the suspicion of nerve damage, likely from his underlying diabetes.  As discussed, I would recommend ongoing strict diabetes control.  For now, I recommend he follow-up with his endocrinologist (diabetes specialist) and primary care physician closely.  Prev. On Reglan. On ASA 81.  Pt has FH of DM in MGM, MGGM, PGF - prediabetes.  He also has a history of HTN, cardiac murmur.  He had tachycardia and palpitations due to autoimmune.  He was started on carvedilol after an ED visit in 2019.  He continued to have shortness of breath and exertional fatigue. Stress test, 2D Echo and heart monitor >> normal in 2021.    He was recently found to have slightly low  TSH levels:    2022-04-11    THYROGLOBULIN ANTIBODIES <1   < or = 1  THYROID PEROXIDASE ANTIBODIES 11   <9  TRAb (TSH Receptor Binding Antibody)   2022-04-11    TRAB <1.00   <=2.00  TSH+FREE T4   2022-04-11    TSH 0.31   0.40-4.50  T4, FREE 1.0   0.8-1.8   Lab Results  Component Value Date   TSH 0.317 (L) 02/08/2022   TSH 0.277 (L) 06/07/2021   TSH 0.62 03/17/2019   TSH 1.02 11/18/2018   TSH 0.517 08/26/2018   TSH 0.41 08/14/2018   TSH 0.658 08/10/2018   Further labs reviewed per  records from PCP: ACTH, PLASMA   2022-07-09    ACTH, PLASMA 19   6-50  CORTISOL, A.M.   2022-07-09    CORTISOL, A.M. 10.3      He is unemployed.  He walks for exercise but only can walk for 15 to 20 minutes because of shortness of breath.  ROS: + see HPI + weight loss, + fatigue  + muscle, + joint aches  Past Medical History:  Diagnosis Date   Complication of anesthesia    Hard to wake up when he was younger once.   Diabetes mellitus, type II ([place])    Heart murmur    Hypertension    Past Surgical History:  Procedure Laterality Date   AMPUTATION Right 05/21/2021    Procedure: AMPUTATION RAY 5th;  Surgeon: Trula Slade, DPM;  Location: [place];  Service: Podiatry;  Laterality: Right;   PALATE / UVULA BIOPSY / EXCISION     "growth removed"   UPPER GASTROINTESTINAL ENDOSCOPY     Done in [place] - 2013 ish   WOUND DEBRIDEMENT Right 05/24/2021   Procedure: RIGHT FOOT WOUND DEBRIDEMENT  AND CLOSURE;  Surgeon: Evelina Bucy, DPM;  Location: [place];  Service: Podiatry;  Laterality: Right;   Social History   Socioeconomic History   Marital status: Single    Spouse name: Not on file   Number of children: Not on file   Years of education: Not on file   Highest education level: Not on file  Occupational History   Not on file  Tobacco Use   Smoking status: Never    Passive exposure: Yes   Smokeless tobacco: Never  Vaping Use   Vaping Use: Never used  Substance and Sexual Activity   Alcohol use: No   Drug use: No   Sexual activity: Not on file  Other Topics Concern   Not on file  Social History Narrative   Not on file   Social Determinants of Health   Financial Resource Strain: Not on file  Food Insecurity: Not on file  Transportation Needs: Not on file  Physical Activity: Not on file  Stress: Not on file  Social Connections: Not on file  Intimate Partner Violence: Not on file   Current Outpatient Medications on File Prior to Visit  Medication Sig Dispense Refill   acetaminophen (TYLENOL) 500 MG tablet Take 1,000 mg by mouth every 6 (six) hours as needed for moderate pain or headache.     aspirin EC 81 MG tablet Take 1 tablet (81 mg total) by mouth daily.     atorvastatin (LIPITOR) 10 MG tablet Take 1 tablet by mouth daily.     blood glucose meter kit and supplies KIT Dispense based on patient and insurance preference. Use up to four times daily as directed. (FOR ICD-10 E11.65). 1 each 5   busPIRone (BUSPAR) 5 MG tablet Take 5 mg by mouth 2 (two) times daily.     DULoxetine (CYMBALTA) 30 MG capsule Take 30 mg by mouth daily.      FARXIGA 10 MG TABS tablet Take 10 mg by mouth daily.     GVOKE HYPOPEN 1-PACK 1 MG/0.2ML SOAJ Inject 1 mg under skin as needed for hypoglycemia 0.2 mL prn   hydrOXYzine (ATARAX/VISTARIL) 50 MG tablet Take 50 mg by mouth in the morning and at bedtime.     ibuprofen (ADVIL) 800 MG tablet Take 1 tablet (800 mg total) by mouth every 8 (eight) hours as needed. 90 tablet 0   insulin glargine (LANTUS SOLOSTAR) 100 UNIT/ML Solostar Pen Inject 65 Units into the skin daily. 45 mL 3   Insulin Pen Needle (CARETOUCH PEN NEEDLES) 31G X 6 MM MISC Use 6x a day 400 each 3   Lancet Devices (ACCU-CHEK SOFTCLIX) lancets Use as instructed 4 x daily. e11.65 150 each 5   Lancets (ACCU-CHEK SOFT TOUCH) lancets Use as instructed 100 each 12   metoprolol succinate (TOPROL-XL) 100 MG 24 hr tablet Take 1 tablet (100 mg total) by mouth at bedtime. Take with or immediately following a meal. 90 tablet 3   Multiple Vitamin (MULTIVITAMIN WITH MINERALS) TABS tablet Take 1 tablet by mouth daily. 30 tablet 0   Naphazoline HCl (CLEAR EYES OP) Place 1 drop into the left eye daily as needed (dry eyes).     ondansetron (ZOFRAN ODT) 4 MG disintegrating tablet Take 1 tablet (4 mg total) by mouth every 8 (eight) hours as needed for nausea or vomiting. 20 tablet 0   OVER THE COUNTER MEDICATION Place 4-5 drops into the left ear daily as needed (ringing in ear). Ear ringing medication  silver sulfADIAZINE (SILVADENE) 1 % cream Apply pea-sized amount to wound daily. 50 g 0   zolpidem (AMBIEN) 10 MG tablet Take 10 mg by mouth at bedtime.     nitroGLYCERIN (NITROSTAT) 0.4 MG SL tablet Place 1 tablet (0.4 mg total) under the tongue every 5 (five) minutes as needed for chest pain. 25 tablet 3   Semaglutide, 2 MG/DOSE, (OZEMPIC, 2 MG/DOSE,) 8 MG/3ML SOPN INJECT 2MG  SUBCUTANEOUSLY ONCE A WEEK (Patient not taking: Reported on 07/31/2022)     No current facility-administered medications on file prior to visit.   Allergies  Allergen Reactions    Amoxicillin Hives    Has patient had a PCN reaction causing immediate rash, facial/tongue/throat swelling, SOB or lightheadedness with hypotension: No Has patient had a PCN reaction causing severe rash involving mucus membranes or skin necrosis: No Has patient had a PCN reaction that required hospitalization: No Has patient had a PCN reaction occurring within the last 10 years: No If all of the above answers are "NO", then may proceed with Cephalosporin use.    Lexapro [Escitalopram] Other (See Comments)   Family History  Problem Relation Age of Onset   Healthy Mother    Healthy Father    Healthy Sister    Healthy Sister    Diabetes Maternal Grandmother    Thyroid disease Maternal Grandmother    Hypertension Maternal Grandmother    Neuropathy Maternal Grandmother     PE: BP 100/70 (BP Location: Left Arm, Patient Position: Sitting, Cuff Size: Small)   Pulse 96   Ht _0  (1.905 m)   Wt 172 lb (78 kg)   SpO2 98%   BMI 21.50 kg/m  Wt Readings from Last 3 Encounters:  07/31/22 172 lb (78 kg)  07/17/22 170 lb (77.1 kg)  03/27/22 180 lb 3.2 oz (81.7 kg)   Constitutional: thin, in NAD Eyes:EOMI, no exophthalmos ENT: moist mucous membranes, no thyromegaly, no cervical lymphadenopathy Cardiovascular: Tachycardia, RR, No MRG Respiratory: CTA B Musculoskeletal: no deformities Skin: moist, warm, + macular rash bilateral shins and feet Neurological: no tremor with outstretched hands  ASSESSMENT: 1. DM2, insulin-dependent, uncontrolled, with complications - DR - Autonomic neuropathy - PN - Microalbuminuria  2.  Weight loss  3.  Hashimoto's thyroiditis  PLAN:  1. Patient with longstanding, uncontrolled, insulin-dependent diabetes, at last visit returning after long absence of 1.5 years, after I only saw him once in 09/2020.  His HbA1c improved significantly since 2021, at last visit, being 6.9%. -At that time, we continued his Lantus but I advised him to take a lower dose in  the morning and the higher dose at night to hopefully improve his morning sugars.  We continued  Ozempic and [place]. -At today's visit, however, sugars are very low.  They vary between 40s and 80s mostly.  He saw his PCP last week and he was taken off Ozempic.  I definitely agree with this.  Even after stopping Ozempic, his sugars remain low.  We discussed that it may take 1 or 2 more weeks to completely run out of his system, but since he still has sugars in the 40s, we will go ahead and decrease his Lantus dose.  Will continue Farxiga for now. -At this visit, we will check him for insulin deficiency and pancreatic autoimmunity - I suggested to:  Patient Instructions  Please stay off Ozempic for now.  Continue: - Farxiga 10 mg daily  Decrease: - Lantus 10 units in am and 10 units at night  Please return in 3-4 months with your sugar log.   - we checked his HbA1c: 5.3% (lower) - advised to check sugars at different times of the day - 2x a day, rotating check times - advised for yearly eye exams >> he is not UTD, but pending - he has a lipid check pending by PCP, so will not check this today - return to clinic in 3-4 months  2.  Weight loss -At last visit, he lost 55 pounds since the previous visit, most likely related to [place]; since last visit, he lost 10 more pounds. -He was checked for adrenal insufficiency by PCP and had a normal a.m. cortisol and ACTH -However, he also had a low TSH during investigation by PCP prior to the visit and had a repeat set of TFTs coming up.   3.  Hashimoto's thyroiditis -Please also see problem #2. -Repeat set of labs were checked in 03/2022.  TSH was slightly low, but free T4 was normal.  TPO antibodies were slightly positive and TRAb antibodies were not elevated. -At today's visit, we will recheck his TFTs and also add TSI antibodies.  If these are negative, this could still be mild Graves' disease or the thyrotoxic phase of Hashimoto's thyroiditis.    -If the TSH is only mildly decreased, we may not need to intervene, but he does have significant weight loss and also has tachycardia so I am tempted to check a thyroid uptake and scan and investigate this further.  Orders Placed This Encounter  Procedures   TSH   T4, free   T3, free   C-peptide   ZNT8 Antibodies   Anti-islet cell antibody   Glutamic acid decarboxylase auto abs   Glucose, fasting   Thyroid stimulating immunoglobulin   Component     Latest Ref Rng 07/31/2022  Glucose     65 - 99 mg/dL 67   TSH     0.35 - 5.50 uIU/mL 0.31 (L)   Hemoglobin A1C     4.0 - 5.6 % 5.3   T4,Free(Direct)     0.60 - 1.60 ng/dL 0.88   Glutamic Acid Decarb Ab     <5 IU/mL <5   Triiodothyronine,Free,Serum     2.3 - 4.2 pg/mL 3.2   Islet Cell Ab     Neg:<1:1  Negative   C-Peptide     0.80 - 3.85 ng/mL 0.76 (L)   ZNT8 Antibodies     <15 U/mL <10   TSI     <140 % baseline <89     Labs confirm type 1 diabetes with low insulin production. His TSH is still slightly low while TSI and free thyroid hormones are normal.  For now, only follow-up is needed.  I doubt that the weight loss and tachycardia are related to his slightly low TSH.  Lucas Kingdom, MD PhD [facility] Endocrinology

## 2022-07-31 ENCOUNTER — Ambulatory Visit (INDEPENDENT_AMBULATORY_CARE_PROVIDER_SITE_OTHER): Payer: BC Managed Care – PPO | Admitting: Internal Medicine

## 2022-07-31 ENCOUNTER — Encounter: Payer: Self-pay | Admitting: Internal Medicine

## 2022-07-31 VITALS — BP 100/70 | HR 96 | Ht 75.0 in | Wt 172.0 lb

## 2022-07-31 DIAGNOSIS — E1165 Type 2 diabetes mellitus with hyperglycemia: Secondary | ICD-10-CM | POA: Diagnosis not present

## 2022-07-31 DIAGNOSIS — R634 Abnormal weight loss: Secondary | ICD-10-CM

## 2022-07-31 DIAGNOSIS — R7989 Other specified abnormal findings of blood chemistry: Secondary | ICD-10-CM

## 2022-07-31 DIAGNOSIS — E1142 Type 2 diabetes mellitus with diabetic polyneuropathy: Secondary | ICD-10-CM | POA: Diagnosis not present

## 2022-07-31 LAB — POCT GLYCOSYLATED HEMOGLOBIN (HGB A1C): Hemoglobin A1C: 5.3 % (ref 4.0–5.6)

## 2022-07-31 LAB — T4, FREE: Free T4: 0.88 ng/dL (ref 0.60–1.60)

## 2022-07-31 LAB — T3, FREE: T3, Free: 3.2 pg/mL (ref 2.3–4.2)

## 2022-07-31 LAB — TSH: TSH: 0.31 u[IU]/mL — ABNORMAL LOW (ref 0.35–5.50)

## 2022-07-31 MED ORDER — LANTUS SOLOSTAR 100 UNIT/ML ~~LOC~~ SOPN
20.0000 [IU] | PEN_INJECTOR | Freq: Every day | SUBCUTANEOUS | 3 refills | Status: DC
Start: 2022-07-31 — End: 2023-06-28

## 2022-07-31 NOTE — Patient Instructions (Addendum)
Please stay off Ozempic for now.  Continue: - Farxiga 10 mg daily  Decrease: - Lantus 10 units in am and 10 units at night  Please return in 3-4 months with your sugar log.

## 2022-08-01 LAB — ANTI-ISLET CELL ANTIBODY: Islet Cell Ab: NEGATIVE

## 2022-08-03 ENCOUNTER — Encounter: Payer: Self-pay | Admitting: Internal Medicine

## 2022-08-04 LAB — ZNT8 ANTIBODIES: ZNT8 Antibodies: <10 U/mL (ref <15)

## 2022-08-04 LAB — GLUTAMIC ACID DECARBOXYLASE AUTO ABS: Glutamic Acid Decarb Ab: <5 IU/mL (ref <5)

## 2022-08-04 LAB — GLUCOSE, FASTING: Glucose, Bld: 67 mg/dL (ref 65–99)

## 2022-08-04 LAB — THYROID STIMULATING IMMUNOGLOBULIN: TSI: <89 % baseline (ref <140)

## 2022-08-04 LAB — C-PEPTIDE: C-Peptide: 0.76 ng/mL — ABNORMAL LOW (ref 0.80–3.85)

## 2022-08-06 DIAGNOSIS — E119 Type 2 diabetes mellitus without complications: Secondary | ICD-10-CM | POA: Diagnosis not present

## 2022-08-10 DIAGNOSIS — E103212 Type 1 diabetes mellitus with mild nonproliferative diabetic retinopathy with macular edema, left eye: Secondary | ICD-10-CM | POA: Diagnosis not present

## 2022-08-15 DIAGNOSIS — R946 Abnormal results of thyroid function studies: Secondary | ICD-10-CM | POA: Diagnosis not present

## 2022-08-15 DIAGNOSIS — Z713 Dietary counseling and surveillance: Secondary | ICD-10-CM | POA: Diagnosis not present

## 2022-08-15 DIAGNOSIS — R634 Abnormal weight loss: Secondary | ICD-10-CM | POA: Diagnosis not present

## 2022-08-15 DIAGNOSIS — E108 Type 1 diabetes mellitus with unspecified complications: Secondary | ICD-10-CM | POA: Diagnosis not present

## 2022-08-15 DIAGNOSIS — I959 Hypotension, unspecified: Secondary | ICD-10-CM | POA: Diagnosis not present

## 2022-08-20 ENCOUNTER — Inpatient Hospital Stay (HOSPITAL_COMMUNITY)
Admission: EM | Admit: 2022-08-20 | Discharge: 2022-08-23 | DRG: 857 | Disposition: A | Discharge status: Home / Self Care | Payer: BC Managed Care – PPO | Attending: Internal Medicine | Admitting: Internal Medicine

## 2022-08-20 ENCOUNTER — Encounter (HOSPITAL_COMMUNITY): Payer: Self-pay

## 2022-08-20 ENCOUNTER — Emergency Department (HOSPITAL_COMMUNITY): Payer: BC Managed Care – PPO

## 2022-08-20 ENCOUNTER — Other Ambulatory Visit: Payer: Self-pay

## 2022-08-20 DIAGNOSIS — Z7985 Long-term (current) use of injectable non-insulin antidiabetic drugs: Secondary | ICD-10-CM | POA: Diagnosis not present

## 2022-08-20 DIAGNOSIS — G4709 Other insomnia: Secondary | ICD-10-CM | POA: Diagnosis not present

## 2022-08-20 DIAGNOSIS — L089 Local infection of the skin and subcutaneous tissue, unspecified: Secondary | ICD-10-CM | POA: Diagnosis not present

## 2022-08-20 DIAGNOSIS — Z888 Allergy status to other drugs, medicaments and biological substances status: Secondary | ICD-10-CM

## 2022-08-20 DIAGNOSIS — R809 Proteinuria, unspecified: Secondary | ICD-10-CM | POA: Diagnosis not present

## 2022-08-20 DIAGNOSIS — T8149XA Infection following a procedure, other surgical site, initial encounter: Principal | ICD-10-CM | POA: Diagnosis present

## 2022-08-20 DIAGNOSIS — E1042 Type 1 diabetes mellitus with diabetic polyneuropathy: Secondary | ICD-10-CM | POA: Diagnosis present

## 2022-08-20 DIAGNOSIS — L02611 Cutaneous abscess of right foot: Secondary | ICD-10-CM

## 2022-08-20 DIAGNOSIS — L03115 Cellulitis of right lower limb: Secondary | ICD-10-CM | POA: Diagnosis present

## 2022-08-20 DIAGNOSIS — Z880 Allergy status to penicillin: Secondary | ICD-10-CM | POA: Diagnosis not present

## 2022-08-20 DIAGNOSIS — F32A Depression, unspecified: Secondary | ICD-10-CM

## 2022-08-20 DIAGNOSIS — L03031 Cellulitis of right toe: Secondary | ICD-10-CM | POA: Diagnosis not present

## 2022-08-20 DIAGNOSIS — Z7984 Long term (current) use of oral hypoglycemic drugs: Secondary | ICD-10-CM

## 2022-08-20 DIAGNOSIS — Z794 Long term (current) use of insulin: Secondary | ICD-10-CM | POA: Diagnosis not present

## 2022-08-20 DIAGNOSIS — M86171 Other acute osteomyelitis, right ankle and foot: Secondary | ICD-10-CM | POA: Diagnosis not present

## 2022-08-20 DIAGNOSIS — Z833 Family history of diabetes mellitus: Secondary | ICD-10-CM

## 2022-08-20 DIAGNOSIS — F320 Major depressive disorder, single episode, mild: Secondary | ICD-10-CM | POA: Diagnosis not present

## 2022-08-20 DIAGNOSIS — M7989 Other specified soft tissue disorders: Secondary | ICD-10-CM | POA: Diagnosis not present

## 2022-08-20 DIAGNOSIS — F419 Anxiety disorder, unspecified: Secondary | ICD-10-CM | POA: Diagnosis present

## 2022-08-20 DIAGNOSIS — E782 Mixed hyperlipidemia: Secondary | ICD-10-CM | POA: Diagnosis present

## 2022-08-20 DIAGNOSIS — Z8249 Family history of ischemic heart disease and other diseases of the circulatory system: Secondary | ICD-10-CM | POA: Diagnosis not present

## 2022-08-20 DIAGNOSIS — K219 Gastro-esophageal reflux disease without esophagitis: Secondary | ICD-10-CM | POA: Diagnosis present

## 2022-08-20 DIAGNOSIS — Z79899 Other long term (current) drug therapy: Secondary | ICD-10-CM

## 2022-08-20 DIAGNOSIS — Y835 Amputation of limb(s) as the cause of abnormal reaction of the patient, or of later complication, without mention of misadventure at the time of the procedure: Secondary | ICD-10-CM | POA: Diagnosis present

## 2022-08-20 DIAGNOSIS — E1029 Type 1 diabetes mellitus with other diabetic kidney complication: Secondary | ICD-10-CM | POA: Diagnosis not present

## 2022-08-20 DIAGNOSIS — Z7982 Long term (current) use of aspirin: Secondary | ICD-10-CM | POA: Diagnosis not present

## 2022-08-20 DIAGNOSIS — E11628 Type 2 diabetes mellitus with other skin complications: Secondary | ICD-10-CM | POA: Diagnosis not present

## 2022-08-20 DIAGNOSIS — Z89421 Acquired absence of other right toe(s): Secondary | ICD-10-CM | POA: Diagnosis not present

## 2022-08-20 DIAGNOSIS — E109 Type 1 diabetes mellitus without complications: Secondary | ICD-10-CM

## 2022-08-20 DIAGNOSIS — I10 Essential (primary) hypertension: Secondary | ICD-10-CM

## 2022-08-20 DIAGNOSIS — E1021 Type 1 diabetes mellitus with diabetic nephropathy: Secondary | ICD-10-CM | POA: Diagnosis not present

## 2022-08-20 DIAGNOSIS — E10628 Type 1 diabetes mellitus with other skin complications: Secondary | ICD-10-CM | POA: Diagnosis present

## 2022-08-20 DIAGNOSIS — G4700 Insomnia, unspecified: Secondary | ICD-10-CM | POA: Diagnosis present

## 2022-08-20 DIAGNOSIS — R600 Localized edema: Secondary | ICD-10-CM | POA: Diagnosis not present

## 2022-08-20 LAB — BASIC METABOLIC PANEL
Anion gap: 11 (ref 5–15)
BUN: 15 mg/dL (ref 6–20)
CO2: 27 mmol/L (ref 22–32)
Calcium: 9.8 mg/dL (ref 8.9–10.3)
Chloride: 101 mmol/L (ref 98–111)
Creatinine, Ser: 0.8 mg/dL (ref 0.61–1.24)
GFR, Estimated: >60 mL/min (ref >60)
Glucose, Bld: 103 mg/dL — ABNORMAL HIGH (ref 70–99)
Potassium: 4.6 mmol/L (ref 3.5–5.1)
Sodium: 139 mmol/L (ref 135–145)

## 2022-08-20 LAB — CBC WITH DIFFERENTIAL/PLATELET
Abs Immature Granulocytes: 0.03 10*3/uL (ref 0.00–0.07)
Basophils Absolute: 0.1 10*3/uL (ref 0.0–0.1)
Basophils Relative: 1 %
Eosinophils Absolute: 0.2 10*3/uL (ref 0.0–0.5)
Eosinophils Relative: 1 %
HCT: 45.8 % (ref 39.0–52.0)
Hemoglobin: 15.4 g/dL (ref 13.0–17.0)
Immature Granulocytes: 0 %
Lymphocytes Relative: 15 %
Lymphs Abs: 2 10*3/uL (ref 0.7–4.0)
MCH: 29.2 pg (ref 26.0–34.0)
MCHC: 33.6 g/dL (ref 30.0–36.0)
MCV: 86.7 fL (ref 80.0–100.0)
Monocytes Absolute: 1.3 10*3/uL — ABNORMAL HIGH (ref 0.1–1.0)
Monocytes Relative: 10 %
Neutro Abs: 10.1 10*3/uL — ABNORMAL HIGH (ref 1.7–7.7)
Neutrophils Relative %: 73 %
Platelets: 303 10*3/uL (ref 150–400)
RBC: 5.28 MIL/uL (ref 4.22–5.81)
RDW: 13.5 % (ref 11.5–15.5)
WBC: 13.8 10*3/uL — ABNORMAL HIGH (ref 4.0–10.5)
nRBC: 0 % (ref 0.0–0.2)

## 2022-08-20 LAB — CBG MONITORING, ED: Glucose-Capillary: 74 mg/dL (ref 70–99)

## 2022-08-20 MED ORDER — VANCOMYCIN HCL 1500 MG/300ML IV SOLN: 1500.0000 mg | Freq: Once | INTRAVENOUS | Status: DC

## 2022-08-20 MED ORDER — SODIUM CHLORIDE 0.9 % IV SOLN
2.0000 g | Freq: Three times a day (TID) | INTRAVENOUS | Status: DC
  Administered 2022-08-21 – 2022-08-23 (×8): 2 g via INTRAVENOUS
  Filled 2022-08-20 (×9): qty 12.5

## 2022-08-20 MED ORDER — VANCOMYCIN HCL IN DEXTROSE 1-5 GM/200ML-% IV SOLN
1000.0000 mg | Freq: Once | INTRAVENOUS | Status: AC
Start: 2022-08-20 — End: 2022-08-20
  Administered 2022-08-20: 1000 mg via INTRAVENOUS
  Filled 2022-08-20: qty 200

## 2022-08-20 MED ORDER — INSULIN ASPART 100 UNIT/ML IJ SOLN
0.0000 [IU] | INTRAMUSCULAR | Status: DC
  Administered 2022-08-21 – 2022-08-23 (×4): 1 [IU] via SUBCUTANEOUS

## 2022-08-20 NOTE — H&P (Addendum)
History and Physical    Patient: [name] DJM:[id number] DOB: 10/25/1996 DOA: 08/20/2022 DOS: the patient was seen and examined on 08/20/2022 PCP: Danna Hefty, DO  Patient coming from: Home  Chief Complaint:  Chief Complaint  Patient presents with   Wound Check   HPI: [name] is a 26 y.o. 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 Earleen Newport) who presents to the emergency department due to redness of right foot with swelling at site of prior amputation which started today.  Patient denies any pain which he thought was possibly due to neuropathy in his foot, he denies fever, chills, nausea, vomiting, any injury to the foot.  ED Course:  In the emergency department, he was hemodynamically stable.  Work-up in the ED showed normal CBC except for WBC of 13.8 and normal BMP except for CBG of 103. Right foot x-ray showed no fracture or dislocation is seen. No focal lytic lesions are seen. If there is clinical suspicion for osteomyelitis, follow-up MRI should be considered.  Podiatrist (Dr. Blenda Mounts) was consulted and recommended vancomycin and for patient to be admitted to [facility] for podiatric consultation.  Patient was treated with IV vancomycin.  Hospitalist was asked to admit patient for further evaluation and management.  Review of Systems: Review of systems as noted in the HPI. All other systems reviewed and are negative.   Past Medical History:  Diagnosis Date   Complication of anesthesia    Hard to wake up when he was younger once.   Diabetes mellitus, type II ([place])    Brown murmur    Hypertension    Past Surgical History:  Procedure Laterality Date   AMPUTATION Right 05/21/2021   Procedure: AMPUTATION RAY 5th;  Surgeon: Trula Slade, DPM;  Location: [place];  Service: Podiatry;  Laterality: Right;   PALATE /  UVULA BIOPSY / EXCISION     "growth removed"   UPPER GASTROINTESTINAL ENDOSCOPY     Done in [place] - 2013 ish   WOUND DEBRIDEMENT Right 05/24/2021   Procedure: RIGHT FOOT WOUND DEBRIDEMENT AND CLOSURE;  Surgeon: Evelina Bucy, DPM;  Location: [place];  Service: Podiatry;  Laterality: Right;    Social History:  reports that he has never smoked. He has been exposed to tobacco smoke. He has never used smokeless tobacco. He reports that he does not drink alcohol and does not use drugs.   Allergies  Allergen Reactions   Amoxicillin Hives    Has patient had a PCN reaction causing immediate rash, facial/tongue/throat swelling, SOB or lightheadedness with hypotension: No Has patient had a PCN reaction causing severe rash involving mucus membranes or skin necrosis: No Has patient had a PCN reaction that required hospitalization: No Has patient had a PCN reaction occurring within the last 10 years: No If all of the above answers are "NO", then may proceed with Cephalosporin use.    Lexapro [Escitalopram] Other (See Comments)    Family History  Problem Relation Age of Onset   Healthy Mother    Healthy Father    Healthy Sister    Healthy Sister    Diabetes Maternal Grandmother    Thyroid disease Maternal Grandmother    Hypertension Maternal Grandmother    Neuropathy Maternal Grandmother      Prior to Admission medications   Medication Sig Start Date End Date Taking? Authorizing Provider  busPIRone (  BUSPAR) 5 MG tablet 1 tablet Orally Twice a day for 30 days 07/25/22  Yes [provider]  acetaminophen (TYLENOL) 500 MG tablet Take 1,000 mg by mouth every 6 (six) hours as needed for moderate pain or headache.    [provider]  aspirin EC 81 MG tablet Take 1 tablet (81 mg total) by mouth daily. 09/13/20   Verta Ellen., NP  aspirin EC 81 MG tablet 1 tablet Orally Once a day    [provider]  atorvastatin (LIPITOR) 10 MG tablet Take 1 tablet by mouth  daily.    [provider]  blood glucose meter kit and supplies KIT Dispense based on patient and insurance preference. Use up to four times daily as directed. (FOR ICD-10 E11.65). 01/13/19   Cassandria Anger, MD  busPIRone (BUSPAR) 5 MG tablet Take 5 mg by mouth 2 (two) times daily. 07/26/22   [provider]  DULoxetine (CYMBALTA) 30 MG capsule Take 30 mg by mouth daily. 07/18/22   [provider]  DULoxetine (CYMBALTA) 30 MG capsule 1 capsule Orally Once a day for 30 day(s)    [provider]  FARXIGA 10 MG TABS tablet Take 10 mg by mouth daily. 01/25/22   [provider]  GVOKE HYPOPEN 1-PACK 1 MG/0.2ML SOAJ Inject 1 mg under skin as needed for hypoglycemia 05/14/22   Philemon Kingdom, MD  hydrOXYzine (ATARAX) 50 MG tablet 1 tablet as needed Orally Once a day for 30 days    [provider]  hydrOXYzine (ATARAX/VISTARIL) 50 MG tablet Take 50 mg by mouth in the morning and at bedtime.    [provider]  ibuprofen (ADVIL) 800 MG tablet Take 1 tablet (800 mg total) by mouth every 8 (eight) hours as needed. 06/24/22   Carole Civil, MD  insulin glargine (LANTUS SOLOSTAR) 100 UNIT/ML Solostar Pen Inject 20 Units into the skin daily. 07/31/22   Philemon Kingdom, MD  Insulin Pen Needle (CARETOUCH PEN NEEDLES) 31G X 6 MM MISC Use 6x a day 10/18/20   Philemon Kingdom, MD  Lancet Devices [facility]) lancets Use as instructed 4 x daily. e11.65 04/03/17   Cassandria Anger, MD  Lancets (ACCU-CHEK SOFT TOUCH) lancets Use as instructed 08/10/18   Isla Pence, MD  losartan (COZAAR) 25 MG tablet Take 25 mg by mouth daily. 08/16/22   [provider]  Melatonin 5 MG CAPS 1 capsule at bedtime as needed Orally Once a day    [provider]  metoprolol succinate (TOPROL-XL) 100 MG 24 hr tablet Take 1 tablet (100 mg total) by mouth at bedtime. Take with or immediately following a meal. 07/17/22 07/12/23  Hilty, Nadean Corwin,  MD  Multiple Vitamin (MULTI VITAMIN) TABS 1 tablet Orally Once a day    [provider]  Multiple Vitamin (MULTIVITAMIN WITH MINERALS) TABS tablet Take 1 tablet by mouth daily. 06/09/21   Little Ishikawa, MD  Naphazoline HCl (CLEAR EYES OP) Place 1 drop into the left eye daily as needed (dry eyes).    [provider]  nitroGLYCERIN (NITROSTAT) 0.4 MG SL tablet Place 1 tablet (0.4 mg total) under the tongue every 5 (five) minutes as needed for chest pain. 09/13/20 10/30/21  Verta Ellen., NP  nitroGLYCERIN (NITROSTAT) 0.4 MG SL tablet as directed Sublingual    [provider]  omeprazole (PRILOSEC) 20 MG capsule TAKE 1 CAPSULE BY MOUTH ONCE DAILY AS NEEDED FOR 90 DAYS for 90  [provider]  ondansetron (ZOFRAN ODT) 4 MG disintegrating tablet Take 1 tablet (4 mg total) by mouth every 8 (eight) hours as needed for nausea or vomiting. 10/17/21   Maudie Flakes, MD  OVER THE COUNTER MEDICATION Place 4-5 drops into the left ear daily as needed (ringing in ear). Ear ringing medication    [provider]  Semaglutide, 2 MG/DOSE, (OZEMPIC, 2 MG/DOSE,) 8 MG/3ML SOPN INJECT 2 MG SUBCUTANEOUSLY ONCE A WEEK for 28    [provider]  silver sulfADIAZINE (SILVADENE) 1 % cream Apply pea-sized amount to wound daily. 06/20/21   Evelina Bucy, DPM  zolpidem (AMBIEN) 10 MG tablet Take 10 mg by mouth at bedtime. 08/17/20   [provider]    Physical Exam: BP (!) 134/94   Pulse 84   Temp 98.2 F (36.8 C) (Oral)   Resp 18   SpO2 100%   General: 26 y.o. year-old male well developed well nourished in no acute distress.  Alert and oriented x3. HEENT: NCAT, EOMI Neck: Supple, trachea medial Cardiovascular: Regular rate and rhythm with no rubs or gallops.  No thyromegaly or JVD noted.  No lower extremity edema. 2/4 pulses in all 4 extremities. Respiratory: Clear to auscultation with no wheezes or rales. Good inspiratory effort. Abdomen:  Soft, nontender nondistended with normal bowel sounds x4 quadrants. Muskuloskeletal: Redness and warmth to l right dorsal lateral foot with intact postal on prior amputation site.  No cyanosis or clubbing  noted bilaterally Neuro: CN II-XII intact, strength 5/5 x 4, sensation, reflexes intact Skin: No ulcerative lesions noted or rashes Psychiatry: Mood is appropriate for condition and setting          Labs on Admission:  Basic Metabolic Panel: Recent Labs  Lab 08/20/22 2039  NA 139  K 4.6  CL 101  CO2 27  GLUCOSE 103*  BUN 15  CREATININE 0.80  CALCIUM 9.8   Liver Function Tests: No results for input(s): "AST", "ALT", "ALKPHOS", "BILITOT", "PROT", "ALBUMIN" in the last 168 hours. No results for input(s): "LIPASE", "AMYLASE" in the last 168 hours. No results for input(s): "AMMONIA" in the last 168 hours. CBC: Recent Labs  Lab 08/20/22 2039  WBC 13.8*  NEUTROABS 10.1*  HGB 15.4  HCT 45.8  MCV 86.7  PLT 303   Cardiac Enzymes: No results for input(s): "CKTOTAL", "CKMB", "CKMBINDEX", "TROPONINI" in the last 168 hours.  BNP (last 3 results) No results for input(s): "BNP" in the last 8760 hours.  ProBNP (last 3 results) No results for input(s): "PROBNP" in the last 8760 hours.  CBG: Recent Labs  Lab 08/20/22 2030  GLUCAP 74    Radiological Exams on Admission: DG Foot Complete Right  Result Date: 08/20/2022 CLINICAL DATA:  Redness and swelling, diabetes EXAM: RIGHT FOOT COMPLETE - 3+ VIEW COMPARISON:  11/10/2021 FINDINGS: There is previous partial amputation of fifth metatarsal along with a fifth toe. There is interval healing of fracture seen in the distal shaft of third metatarsal. There is marked focal soft tissue swelling lateral to the fourth metatarsophalangeal joint. No definite focal lytic lesions are seen. There are no pockets of air in the soft tissues. IMPRESSION: No fracture or dislocation is seen. No focal lytic lesions are seen. If there is clinical  suspicion for osteomyelitis, follow-up MRI should be considered. There is marked focal soft tissue swelling lateral to the fourth metatarsophalangeal joint suggesting soft tissue infection. Electronically Signed   By: Elmer Picker M.D.   On: 08/20/2022 20:57  EKG: I independently viewed the EKG done and my findings are as followed: EKG was not done in the ED  Assessment/Plan Present on Admission:  Anxiety  Gastroesophageal reflux disease  Insomnia  Principal Problem:   Cellulitis and abscess of toe of right foot Active Problems:   Type 1 diabetes mellitus (HCC)   Essential hypertension   Anxiety   Gastroesophageal reflux disease   Insomnia   Mixed hyperlipidemia   Depression  Cellulitis and abscess of right foot Patient was started with IV vancomycin, we shall continue with vancomycin and cefepime Continue Tylenol as needed MRI of right foot with and without contrast will be done Patient to be placed n.p.o. at midnight in anticipation for possible surgical procedure in the morning Podiatrist (Dr. Blenda Mounts) was already consulted and recommended admitting patient to [facility]  Type 1 diabetes mellitus Continue ISS and hypoglycemia protocol  Essential hypertension Continue losartan, metoprolol  GERD Continue Protonix  Mixed hyperlipidemia Continue Lipitor  Anxiety and depression Continue buspirone and Cymbalta  Insomnia Continue Ambien  DVT prophylaxis: SCDs  Code Status: Full code  Consults: Podiatry (by ED physician)  Family Communication: None at bedside  Severity of Illness: The appropriate patient status for this patient is INPATIENT. Inpatient status is judged to be reasonable and necessary in order to provide the required intensity of service to ensure the patient's safety. The patient's presenting symptoms, physical exam findings, and initial radiographic and laboratory data in the context of their chronic comorbidities is felt to place them at high  risk for further clinical deterioration. Furthermore, it is not anticipated that the patient will be medically stable for discharge from the hospital within 2 midnights of admission.   * I certify that at the point of admission it is my clinical judgment that the patient will require inpatient hospital care spanning beyond 2 midnights from the point of admission due to high intensity of service, high risk for further deterioration and high frequency of surveillance required.*  Author: Bernadette Hoit, DO 08/20/2022 11:56 PM  For on call review [email or web address].

## 2022-08-20 NOTE — ED Provider Notes (Signed)
[facility] EMERGENCY DEPARTMENT Provider Note   CSN: [id number] Arrival date & time: 08/20/22  1730     History  Chief Complaint  Patient presents with   Wound Check    [name] is a 26 y.o. male with a history including type 1 diabetes, hypertension, diabetic peripheral neuropathy, history of acute osteomyelitis requiring amputation of his right fifth toe under the care of Dr. Earleen Newport, presenting for evaluation of right foot redness and swelling at the site of his prior amputation which he first noticed today when removing his shoe.  He denies any pain in the foot but endorses he has severe neuropathy in his foot.  He denies any obvious injuries.  He also denies fevers or chills, nausea or vomiting.  His glucose level when he woke this morning was around 120.  He has had no treatment prior to arrival.  He denies any obvious trauma to the foot.  The history is provided by the patient.       Home Medications Prior to Admission medications   Medication Sig Start Date End Date Taking? Authorizing Provider  busPIRone (BUSPAR) 5 MG tablet 1 tablet Orally Twice a day for 30 days 07/25/22  Yes [provider]  acetaminophen (TYLENOL) 500 MG tablet Take 1,000 mg by mouth every 6 (six) hours as needed for moderate pain or headache.    [provider]  aspirin EC 81 MG tablet Take 1 tablet (81 mg total) by mouth daily. 09/13/20   Verta Ellen., NP  aspirin EC 81 MG tablet 1 tablet Orally Once a day    [provider]  atorvastatin (LIPITOR) 10 MG tablet Take 1 tablet by mouth daily.    [provider]  blood glucose meter kit and supplies KIT Dispense based on patient and insurance preference. Use up to four times daily as directed. (FOR ICD-10 E11.65). 01/13/19   Cassandria Anger, MD  busPIRone (BUSPAR) 5 MG tablet Take 5 mg by mouth 2 (two) times daily. 07/26/22   [provider]  DULoxetine (CYMBALTA) 30 MG capsule Take 30 mg by mouth  daily. 07/18/22   [provider]  DULoxetine (CYMBALTA) 30 MG capsule 1 capsule Orally Once a day for 30 day(s)    [provider]  FARXIGA 10 MG TABS tablet Take 10 mg by mouth daily. 01/25/22   [provider]  GVOKE HYPOPEN 1-PACK 1 MG/0.2ML SOAJ Inject 1 mg under skin as needed for hypoglycemia 05/14/22   Philemon Kingdom, MD  hydrOXYzine (ATARAX) 50 MG tablet 1 tablet as needed Orally Once a day for 30 days    [provider]  hydrOXYzine (ATARAX/VISTARIL) 50 MG tablet Take 50 mg by mouth in the morning and at bedtime.    [provider]  ibuprofen (ADVIL) 800 MG tablet Take 1 tablet (800 mg total) by mouth every 8 (eight) hours as needed. 06/24/22   Carole Civil, MD  insulin glargine (LANTUS SOLOSTAR) 100 UNIT/ML Solostar Pen Inject 20 Units into the skin daily. 07/31/22   Philemon Kingdom, MD  Insulin Pen Needle (CARETOUCH PEN NEEDLES) 31G X 6 MM MISC Use 6x a day 10/18/20   Philemon Kingdom, MD  Lancet Devices [facility]) lancets Use as instructed 4 x daily. e11.65 04/03/17   Cassandria Anger, MD  Lancets (ACCU-CHEK SOFT TOUCH) lancets Use as instructed 08/10/18   Isla Pence, MD  losartan (COZAAR) 25 MG tablet Take 25 mg by mouth daily. 08/16/22  [provider]  Melatonin 5 MG CAPS 1 capsule at bedtime as needed Orally Once a day    [provider]  metoprolol succinate (TOPROL-XL) 100 MG 24 hr tablet Take 1 tablet (100 mg total) by mouth at bedtime. Take with or immediately following a meal. 07/17/22 07/12/23  Hilty, Nadean Corwin, MD  Multiple Vitamin (MULTI VITAMIN) TABS 1 tablet Orally Once a day    [provider]  Multiple Vitamin (MULTIVITAMIN WITH MINERALS) TABS tablet Take 1 tablet by mouth daily. 06/09/21   Little Ishikawa, MD  Naphazoline HCl (CLEAR EYES OP) Place 1 drop into the left eye daily as needed (dry eyes).    [provider]  nitroGLYCERIN (NITROSTAT) 0.4 MG SL tablet  Place 1 tablet (0.4 mg total) under the tongue every 5 (five) minutes as needed for chest pain. 09/13/20 10/30/21  Verta Ellen., NP  nitroGLYCERIN (NITROSTAT) 0.4 MG SL tablet as directed Sublingual    [provider]  omeprazole (PRILOSEC) 20 MG capsule TAKE 1 CAPSULE BY MOUTH ONCE DAILY AS NEEDED FOR 90 DAYS for 90    [provider]  ondansetron (ZOFRAN ODT) 4 MG disintegrating tablet Take 1 tablet (4 mg total) by mouth every 8 (eight) hours as needed for nausea or vomiting. 10/17/21   Maudie Flakes, MD  OVER THE COUNTER MEDICATION Place 4-5 drops into the left ear daily as needed (ringing in ear). Ear ringing medication    [provider]  Semaglutide, 2 MG/DOSE, (OZEMPIC, 2 MG/DOSE,) 8 MG/3ML SOPN INJECT 2 MG SUBCUTANEOUSLY ONCE A WEEK for 28    [provider]  silver sulfADIAZINE (SILVADENE) 1 % cream Apply pea-sized amount to wound daily. 06/20/21   Evelina Bucy, DPM  zolpidem (AMBIEN) 10 MG tablet Take 10 mg by mouth at bedtime. 08/17/20   [provider]      Allergies    Amoxicillin and Lexapro [escitalopram]    Review of Systems   Review of Systems  Constitutional:  Negative for chills and fever.  Musculoskeletal:  Positive for joint swelling. Negative for arthralgias and myalgias.  Skin:  Positive for color change.  Neurological:  Negative for weakness and numbness.  All other systems reviewed and are negative.   Physical Exam Updated Vital Signs BP (!) 134/93 (BP Location: Left Arm)   Pulse 89   Temp 98.2 F (36.8 C) (Oral)   Resp 18   SpO2 100%  Physical Exam Constitutional:      Appearance: He is well-developed.  HENT:     Head: Atraumatic.  Cardiovascular:     Comments: Pulses equal bilaterally Musculoskeletal:        General: Swelling present. No tenderness.     Cervical back: Normal range of motion.     Comments: Erythema noted to the right dorsal lateral foot.  There is a intact pustule along the  incision from prior fifth toe amputation.  Dorsalis pedal pulse is full.  No red streaking noted.  Skin:    General: Skin is warm and dry.     Findings: Erythema present.  Neurological:     Mental Status: He is alert.     Sensory: No sensory deficit.     Motor: No weakness.     Deep Tendon Reflexes: Reflexes normal.        ED Results / Procedures / Treatments   Labs (all labs ordered are listed, but only abnormal results are displayed) Labs Reviewed  CBC WITH DIFFERENTIAL/PLATELET -  Abnormal; Notable for the following components:      Result Value   WBC 13.8 (*)    Neutro Abs 10.1 (*)    Monocytes Absolute 1.3 (*)    All other components within normal limits  BASIC METABOLIC PANEL - Abnormal; Notable for the following components:   Glucose, Bld 103 (*)    All other components within normal limits  CBG MONITORING, ED    EKG None  Radiology DG Foot Complete Right  Result Date: 08/20/2022 CLINICAL DATA:  Redness and swelling, diabetes EXAM: RIGHT FOOT COMPLETE - 3+ VIEW COMPARISON:  11/10/2021 FINDINGS: There is previous partial amputation of fifth metatarsal along with a fifth toe. There is interval healing of fracture seen in the distal shaft of third metatarsal. There is marked focal soft tissue swelling lateral to the fourth metatarsophalangeal joint. No definite focal lytic lesions are seen. There are no pockets of air in the soft tissues. IMPRESSION: No fracture or dislocation is seen. No focal lytic lesions are seen. If there is clinical suspicion for osteomyelitis, follow-up MRI should be considered. There is marked focal soft tissue swelling lateral to the fourth metatarsophalangeal joint suggesting soft tissue infection. Electronically Signed   By: Elmer Picker M.D.   On: 08/20/2022 20:57    Procedures Procedures    Medications Ordered in ED Medications  vancomycin (VANCOCIN) IVPB 1000 mg/200 mL premix (1,000 mg Intravenous New Bag/Given 08/20/22 2152)     ED Course/ Medical Decision Making/ A&P                           Medical Decision Making Patient with an infected diabetic foot, abscess with cellulitis.  Labs are significant for a WBC count of 13.8.  His glucose is under good control at 103.  Amount and/or Complexity of Data Reviewed Labs: ordered.    Details: Per above Radiology: ordered.    Details: No obvious osteomyelitis based on plain film imaging, patient will probably require MRI imaging for definitive diagnosis. Discussion of management or test interpretation with external provider(s): Discussed with Dr. Blenda Mounts of podiatry who is on-call for Dr. Earleen Newport this evening.  She recommended broad-spectrum antibiotic vancomycin which has been ordered.  Patient will need admission to the hospitalist service, transferred to [facility] for podiatry consultation.  Discussed with Dr. Josephine Cables who accepts pt for admission.  Risk Prescription drug management. Drug therapy requiring intensive monitoring for toxicity. Decision regarding hospitalization.           Final Clinical Impression(s) / ED Diagnoses Final diagnoses:  Diabetic foot infection [facility])    Rx / DC Orders ED Discharge Orders     None         Landis Martins 08/20/22 2209    Isla Pence, MD 08/20/22 2259

## 2022-08-20 NOTE — ED Triage Notes (Signed)
Patient with redness and warmth to right foot. Hx of amputation of toe to this foot and patient states that this is how infection with progression started. States that he has decreased sensation to foot.

## 2022-08-21 ENCOUNTER — Inpatient Hospital Stay (HOSPITAL_COMMUNITY): Payer: BC Managed Care – PPO

## 2022-08-21 DIAGNOSIS — L02611 Cutaneous abscess of right foot: Secondary | ICD-10-CM | POA: Diagnosis not present

## 2022-08-21 DIAGNOSIS — L03031 Cellulitis of right toe: Secondary | ICD-10-CM | POA: Diagnosis not present

## 2022-08-21 LAB — COMPREHENSIVE METABOLIC PANEL
ALT: 14 U/L (ref 0–44)
AST: 18 U/L (ref 15–41)
Albumin: 4.1 g/dL (ref 3.5–5.0)
Alkaline Phosphatase: 69 U/L (ref 38–126)
Anion gap: 10 (ref 5–15)
BUN: 11 mg/dL (ref 6–20)
CO2: 25 mmol/L (ref 22–32)
Calcium: 9.8 mg/dL (ref 8.9–10.3)
Chloride: 105 mmol/L (ref 98–111)
Creatinine, Ser: 0.7 mg/dL (ref 0.61–1.24)
GFR, Estimated: >60 mL/min (ref >60)
Glucose, Bld: 115 mg/dL — ABNORMAL HIGH (ref 70–99)
Potassium: 4.3 mmol/L (ref 3.5–5.1)
Sodium: 140 mmol/L (ref 135–145)
Total Bilirubin: 1.5 mg/dL — ABNORMAL HIGH (ref 0.3–1.2)
Total Protein: 8.2 g/dL — ABNORMAL HIGH (ref 6.5–8.1)

## 2022-08-21 LAB — GLUCOSE, CAPILLARY
Glucose-Capillary: 110 mg/dL — ABNORMAL HIGH (ref 70–99)
Glucose-Capillary: 123 mg/dL — ABNORMAL HIGH (ref 70–99)
Glucose-Capillary: 97 mg/dL (ref 70–99)
Glucose-Capillary: 99 mg/dL (ref 70–99)

## 2022-08-21 LAB — CBC
HCT: 45.2 % (ref 39.0–52.0)
Hemoglobin: 15.3 g/dL (ref 13.0–17.0)
MCH: 29.3 pg (ref 26.0–34.0)
MCHC: 33.8 g/dL (ref 30.0–36.0)
MCV: 86.4 fL (ref 80.0–100.0)
Platelets: 280 10*3/uL (ref 150–400)
RBC: 5.23 MIL/uL (ref 4.22–5.81)
RDW: 13.2 % (ref 11.5–15.5)
WBC: 12.4 10*3/uL — ABNORMAL HIGH (ref 4.0–10.5)
nRBC: 0 % (ref 0.0–0.2)

## 2022-08-21 LAB — PHOSPHORUS: Phosphorus: 3.6 mg/dL (ref 2.5–4.6)

## 2022-08-21 LAB — CBG MONITORING, ED
Glucose-Capillary: 88 mg/dL (ref 70–99)
Glucose-Capillary: 95 mg/dL (ref 70–99)
Glucose-Capillary: 82 mg/dL (ref 70–99)

## 2022-08-21 LAB — MAGNESIUM: Magnesium: 2 mg/dL (ref 1.7–2.4)

## 2022-08-21 MED ORDER — ASPIRIN 81 MG PO TBEC
81.0000 mg | DELAYED_RELEASE_TABLET | Freq: Every day | ORAL | Status: DC
  Administered 2022-08-21 – 2022-08-23 (×3): 81 mg via ORAL
  Filled 2022-08-21 (×3): qty 1

## 2022-08-21 MED ORDER — BUSPIRONE HCL 5 MG PO TABS
5.0000 mg | ORAL_TABLET | Freq: Two times a day (BID) | ORAL | Status: DC
Start: 2022-08-21 — End: 2022-08-23
  Administered 2022-08-21 – 2022-08-23 (×5): 5 mg via ORAL
  Filled 2022-08-21 (×5): qty 1

## 2022-08-21 MED ORDER — LOSARTAN POTASSIUM 25 MG PO TABS: 25.0000 mg | ORAL_TABLET | Freq: Every day | ORAL | Status: DC

## 2022-08-21 MED ORDER — LACTATED RINGERS IV SOLN: INTRAVENOUS | Status: DC

## 2022-08-21 MED ORDER — METOPROLOL SUCCINATE ER 100 MG PO TB24
100.0000 mg | ORAL_TABLET | Freq: Every day | ORAL | Status: DC
  Administered 2022-08-21 – 2022-08-22 (×2): 100 mg via ORAL
  Filled 2022-08-21 (×2): qty 1

## 2022-08-21 MED ORDER — ZOLPIDEM TARTRATE 5 MG PO TABS
10.0000 mg | ORAL_TABLET | Freq: Every day | ORAL | Status: DC
  Administered 2022-08-21 – 2022-08-22 (×2): 10 mg via ORAL
  Filled 2022-08-21 (×2): qty 2

## 2022-08-21 MED ORDER — PANTOPRAZOLE SODIUM 40 MG PO TBEC
40.0000 mg | DELAYED_RELEASE_TABLET | Freq: Every day | ORAL | Status: DC
  Administered 2022-08-21 – 2022-08-23 (×3): 40 mg via ORAL
  Filled 2022-08-21 (×3): qty 1

## 2022-08-21 MED ORDER — ACETAMINOPHEN 650 MG RE SUPP: 650.0000 mg | Freq: Four times a day (QID) | RECTAL | Status: DC | PRN

## 2022-08-21 MED ORDER — ACETAMINOPHEN 325 MG PO TABS: 650.0000 mg | ORAL_TABLET | Freq: Four times a day (QID) | ORAL | Status: DC | PRN

## 2022-08-21 MED ORDER — VANCOMYCIN HCL 1750 MG/350ML IV SOLN
1750.0000 mg | Freq: Two times a day (BID) | INTRAVENOUS | Status: DC
  Administered 2022-08-21 – 2022-08-22 (×4): 1750 mg via INTRAVENOUS
  Filled 2022-08-21 (×5): qty 350

## 2022-08-21 MED ORDER — ATORVASTATIN CALCIUM 10 MG PO TABS
10.0000 mg | ORAL_TABLET | Freq: Every day | ORAL | Status: DC
  Administered 2022-08-21 – 2022-08-23 (×3): 10 mg via ORAL
  Filled 2022-08-21 (×3): qty 1

## 2022-08-21 MED ORDER — DULOXETINE HCL 30 MG PO CPEP
30.0000 mg | ORAL_CAPSULE | Freq: Every day | ORAL | Status: DC
Start: 2022-08-21 — End: 2022-08-23
  Administered 2022-08-21 – 2022-08-23 (×3): 30 mg via ORAL
  Filled 2022-08-21 (×3): qty 1

## 2022-08-21 MED ORDER — GADOBUTROL 1 MMOL/ML IV SOLN
7.0000 mL | Freq: Once | INTRAVENOUS | Status: AC | PRN
  Administered 2022-08-21: 7 mL via INTRAVENOUS

## 2022-08-21 NOTE — ED Notes (Signed)
IV compatibility verified in [employer].

## 2022-08-21 NOTE — Progress Notes (Signed)
Pharmacy Antibiotic Note  [name] is a 26 y.o. male admitted on 08/20/2022 with  cellulitis/wound infection at previous amputation site .  Pharmacy has been consulted for Vancomycin/Cefepime dosing. WBC mildly elevated. Renal function good.   Plan: Vancomycin 1750 mg IV q12h >>>Estimated AUC: 428 Cefepime 2g IV q8h Trend WBC, temp, renal function  F/U infectious work-up Drug levels as indicated  Temp (24hrs), Avg:98.3 F (36.8 C), Min:98.2 F (36.8 C), Max:98.3 F (36.8 C)  Recent Labs  Lab 08/20/22 2039  WBC 13.8*  CREATININE 0.80    CrCl cannot be calculated (Unknown ideal weight.).    Allergies  Allergen Reactions   Amoxicillin Hives    Has patient had a PCN reaction causing immediate rash, facial/tongue/throat swelling, SOB or lightheadedness with hypotension: No Has patient had a PCN reaction causing severe rash involving mucus membranes or skin necrosis: No Has patient had a PCN reaction that required hospitalization: No Has patient had a PCN reaction occurring within the last 10 years: No If all of the above answers are "NO", then may proceed with Cephalosporin use.    Lexapro [Escitalopram] Other (See Comments)   Narda Bonds, PharmD, [place] Clinical Pharmacist Phone: [phone]

## 2022-08-21 NOTE — Progress Notes (Signed)
PROGRESS NOTE    [name]  IWL:[id number] DOB: 1996-02-13 DOA: 08/20/2022 PCP: Danna Hefty, DO   Brief Narrative:    [name] is a 26 y.o. 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 Earleen Newport) who presents to the emergency department due to redness of right foot with swelling at site of prior amputation which started today.  He was admitted with concerns of cellulitis and abscess to the right foot in the setting of recent amputation.  MRI with no significant findings aside from some cellulitis.  Assessment & Plan:   Principal Problem:   Cellulitis and abscess of toe of right foot Active Problems:   Type 1 diabetes mellitus (HCC)   Essential hypertension   Anxiety   Gastroesophageal reflux disease   Insomnia   Mixed hyperlipidemia   Depression  Assessment and Plan:  Cellulitis and abscess of right foot Patient was started with IV vancomycin, we shall continue with vancomycin and cefepime Continue Tylenol as needed MRI of right foot performed 8/29 with no findings of abscess or osteomyelitis, just some cellulitis New n.p.o. and IV fluid for now until podiatry evaluation Tried calling podiatry 8/29 with no response   Type 1 diabetes mellitus Continue ISS and hypoglycemia protocol   Essential hypertension-stable Continue losartan, metoprolol   GERD Continue Protonix   Mixed hyperlipidemia Continue Lipitor   Anxiety and depression Continue buspirone and Cymbalta   Insomnia Continue Ambien   DVT prophylaxis:SCDs Code Status: Full Family Communication: None at bedside Disposition Plan: Transfer to [facility] for podiatry evaluation Status is: Inpatient Remains inpatient appropriate because: IV meds.  Consultants:  Podiatry   Procedures:  See below  Antimicrobials:  Anti-infectives (From admission,  onward)    Start     Dose/Rate Route Frequency Ordered Stop   08/21/22 1000  vancomycin (VANCOREADY) IVPB 1750 mg/350 mL        1,750 mg 175 mL/hr over 120 Minutes Intravenous Every 12 hours 08/21/22 0237     08/21/22 0000  ceFEPIme (MAXIPIME) 2 g in sodium chloride 0.9 % 100 mL IVPB        2 g 200 mL/hr over 30 Minutes Intravenous Every 8 hours 08/20/22 2335     08/20/22 2345  vancomycin (VANCOREADY) IVPB 1500 mg/300 mL  Status:  Discontinued        1,500 mg 150 mL/hr over 120 Minutes Intravenous  Once 08/20/22 2335 08/21/22 0152   08/20/22 2145  vancomycin (VANCOCIN) IVPB 1000 mg/200 mL premix        1,000 mg 200 mL/hr over 60 Minutes Intravenous  Once 08/20/22 2139 08/20/22 2317       Subjective: Patient seen and evaluated today with no new acute complaints or concerns. No acute concerns or events noted overnight.  Objective: Vitals:   08/21/22 0700 08/21/22 0730 08/21/22 0800 08/21/22 0801  BP: (!) 120/91 (!) 122/90 (!) 125/95   Pulse: 75 81 93   Resp:   16 16  Temp:      TempSrc:      SpO2: 98% 100% 100%     Intake/Output Summary (Last 24 hours) at 08/21/2022 1047 Last data filed at 08/21/2022 0759 Gross per 24 hour  Intake 416.39 ml  Output 575 ml  Net -158.61 ml   There were no vitals filed for this visit.  Examination:  General exam: Appears calm and comfortable  Respiratory system: Clear  to auscultation. Respiratory effort normal. Cardiovascular system: S1 & S2 heard, RRR.  Gastrointestinal system: Abdomen is soft Central nervous system: Alert and awake Extremities: No edema   Skin: No significant lesions noted Psychiatry: Flat affect.    Data Reviewed: I have personally reviewed following labs and imaging studies  CBC: Recent Labs  Lab 08/20/22 2039  WBC 13.8*  NEUTROABS 10.1*  HGB 15.4  HCT 45.8  MCV 86.7  PLT 185   Basic Metabolic Panel: Recent Labs  Lab 08/20/22 2039  NA 139  K 4.6  CL 101  CO2 27  GLUCOSE 103*  BUN 15   CREATININE 0.80  CALCIUM 9.8   GFR: CrCl cannot be calculated (Unknown ideal weight.). Liver Function Tests: No results for input(s): "AST", "ALT", "ALKPHOS", "BILITOT", "PROT", "ALBUMIN" in the last 168 hours. No results for input(s): "LIPASE", "AMYLASE" in the last 168 hours. No results for input(s): "AMMONIA" in the last 168 hours. Coagulation Profile: No results for input(s): "INR", "PROTIME" in the last 168 hours. Cardiac Enzymes: No results for input(s): "CKTOTAL", "CKMB", "CKMBINDEX", "TROPONINI" in the last 168 hours. BNP (last 3 results) No results for input(s): "PROBNP" in the last 8760 hours. HbA1C: No results for input(s): "HGBA1C" in the last 72 hours. CBG: Recent Labs  Lab 08/20/22 2030 08/21/22 0120 08/21/22 0322 08/21/22 0751  GLUCAP 74 88 95 82   Lipid Profile: No results for input(s): "CHOL", "HDL", "LDLCALC", "TRIG", "CHOLHDL", "LDLDIRECT" in the last 72 hours. Thyroid Function Tests: No results for input(s): "TSH", "T4TOTAL", "FREET4", "T3FREE", "THYROIDAB" in the last 72 hours. Anemia Panel: No results for input(s): "VITAMINB12", "FOLATE", "FERRITIN", "TIBC", "IRON", "RETICCTPCT" in the last 72 hours. Sepsis Labs: No results for input(s): "PROCALCITON", "LATICACIDVEN" in the last 168 hours.  No results found for this or any previous visit (from the past 240 hour(s)).       Radiology Studies: MR FOOT RIGHT W WO CONTRAST  Result Date: 08/21/2022 CLINICAL DATA:  Osteomyelitis, foot EXAM: MRI OF THE RIGHT FOREFOOT WITHOUT AND WITH CONTRAST TECHNIQUE: Multiplanar, multisequence MR imaging of the right forefoot was performed before and after the administration of intravenous contrast. CONTRAST:  38m GADAVIST GADOBUTROL 1 MMOL/ML IV SOLN COMPARISON:  X-ray 08/20/2022 FINDINGS: Bones/Joint/Cartilage Prior transmetatarsal amputation of the fifth ray at the level of the mid fifth metatarsal diaphysis. Minimal bone marrow edema along the resection margin  without marrow replacement or cortical destruction. Remaining osseous structures of the forefoot demonstrate normal bone marrow signal. No marrow edema, marrow replacement, or periostitis. No fracture or dislocation. No joint effusions. Ligaments Intact Lisfranc ligament.  Intact collateral ligaments. Muscles and Tendons Denervation changes of the foot musculature. No tenosynovial fluid collection. Soft tissues Well-circumscribed cutaneous blister at the lateral aspect of the distal forefoot measuring 2.7 x 1.1 x 2.6 cm. Possible small adventitial bursa underlying the plantar-lateral aspect the fourth metatarsal head (series 5, image 23). No organized or rim enhancing fluid collection. There is subcutaneous edema over the dorsum of the foot. IMPRESSION: 1. Prior transmetatarsal amputation of the fifth ray at the level of the mid fifth metatarsal diaphysis. Minimal bone marrow edema at the resection margin without marrow replacement or cortical destruction to suggest acute osteomyelitis. 2. Well-circumscribed cutaneous blister at the lateral aspect of the distal forefoot measuring 2.7 x 1.1 x 2.6 cm. 3. Possible small adventitial bursa underlying the plantar-lateral aspect the fourth metatarsal head. 4. Subcutaneous edema over the dorsum of the foot, which may reflect cellulitis. Electronically Signed   By: NHart Carwin  Plundo D.O.   On: 08/21/2022 09:31   DG Foot Complete Right  Result Date: 08/20/2022 CLINICAL DATA:  Redness and swelling, diabetes EXAM: RIGHT FOOT COMPLETE - 3+ VIEW COMPARISON:  11/10/2021 FINDINGS: There is previous partial amputation of fifth metatarsal along with a fifth toe. There is interval healing of fracture seen in the distal shaft of third metatarsal. There is marked focal soft tissue swelling lateral to the fourth metatarsophalangeal joint. No definite focal lytic lesions are seen. There are no pockets of air in the soft tissues. IMPRESSION: No fracture or dislocation is seen. No focal  lytic lesions are seen. If there is clinical suspicion for osteomyelitis, follow-up MRI should be considered. There is marked focal soft tissue swelling lateral to the fourth metatarsophalangeal joint suggesting soft tissue infection. Electronically Signed   By: Elmer Picker M.D.   On: 08/20/2022 20:57        Scheduled Meds:  insulin aspart  0-9 Units Subcutaneous Q4H   Continuous Infusions:  ceFEPime (MAXIPIME) IV 2 g (08/21/22 0521)   lactated ringers 75 mL/hr at 08/21/22 1005   vancomycin 1,750 mg (08/21/22 1005)     LOS: 1 day    Time spent: 35 minutes    Javione Gunawan Darleen Crocker, DO [facility]  If 7PM-7AM, please contact night-coverage [email or web address] 08/21/2022, 10:47 AM

## 2022-08-21 NOTE — ED Notes (Signed)
Dressing applied with non-stick guaze, kling & tape

## 2022-08-21 NOTE — ED Notes (Signed)
Pt transported to MRI 

## 2022-08-21 NOTE — Progress Notes (Signed)
Patient arrived to [place] room 26 alert and oriented x4. Pain level 0/10. Bed in lowest position, call light in reach, urinal at bedside per pt request. Will continue to monitor pt.

## 2022-08-21 NOTE — ED Notes (Signed)
5 oz apple juice given. Pt accepted.

## 2022-08-22 ENCOUNTER — Telehealth: Payer: Self-pay | Admitting: Podiatry

## 2022-08-22 DIAGNOSIS — L03031 Cellulitis of right toe: Secondary | ICD-10-CM

## 2022-08-22 DIAGNOSIS — F419 Anxiety disorder, unspecified: Secondary | ICD-10-CM | POA: Diagnosis not present

## 2022-08-22 DIAGNOSIS — E11628 Type 2 diabetes mellitus with other skin complications: Secondary | ICD-10-CM | POA: Diagnosis not present

## 2022-08-22 DIAGNOSIS — F320 Major depressive disorder, single episode, mild: Secondary | ICD-10-CM

## 2022-08-22 DIAGNOSIS — L02611 Cutaneous abscess of right foot: Secondary | ICD-10-CM

## 2022-08-22 DIAGNOSIS — E1021 Type 1 diabetes mellitus with diabetic nephropathy: Secondary | ICD-10-CM

## 2022-08-22 DIAGNOSIS — G4709 Other insomnia: Secondary | ICD-10-CM

## 2022-08-22 DIAGNOSIS — L089 Local infection of the skin and subcutaneous tissue, unspecified: Secondary | ICD-10-CM

## 2022-08-22 LAB — CBC
HCT: 45.3 % (ref 39.0–52.0)
Hemoglobin: 15.1 g/dL (ref 13.0–17.0)
MCH: 28.9 pg (ref 26.0–34.0)
MCHC: 33.3 g/dL (ref 30.0–36.0)
MCV: 86.8 fL (ref 80.0–100.0)
Platelets: 285 10*3/uL (ref 150–400)
RBC: 5.22 MIL/uL (ref 4.22–5.81)
RDW: 13.2 % (ref 11.5–15.5)
WBC: 10.4 10*3/uL (ref 4.0–10.5)
nRBC: 0 % (ref 0.0–0.2)

## 2022-08-22 LAB — MAGNESIUM: Magnesium: 2.1 mg/dL (ref 1.7–2.4)

## 2022-08-22 LAB — BASIC METABOLIC PANEL
Anion gap: 11 (ref 5–15)
BUN: 12 mg/dL (ref 6–20)
CO2: 25 mmol/L (ref 22–32)
Calcium: 9.7 mg/dL (ref 8.9–10.3)
Chloride: 103 mmol/L (ref 98–111)
Creatinine, Ser: 0.71 mg/dL (ref 0.61–1.24)
GFR, Estimated: >60 mL/min (ref >60)
Glucose, Bld: 83 mg/dL (ref 70–99)
Potassium: 4.1 mmol/L (ref 3.5–5.1)
Sodium: 139 mmol/L (ref 135–145)

## 2022-08-22 LAB — GLUCOSE, CAPILLARY
Glucose-Capillary: 104 mg/dL — ABNORMAL HIGH (ref 70–99)
Glucose-Capillary: 119 mg/dL — ABNORMAL HIGH (ref 70–99)
Glucose-Capillary: 132 mg/dL — ABNORMAL HIGH (ref 70–99)
Glucose-Capillary: 146 mg/dL — ABNORMAL HIGH (ref 70–99)
Glucose-Capillary: 81 mg/dL (ref 70–99)
Glucose-Capillary: 83 mg/dL (ref 70–99)

## 2022-08-22 NOTE — Plan of Care (Signed)
  Problem: Safety: Goal: Ability to remain free from injury will improve Outcome: Progressing   Problem: Pain Managment: Goal: General experience of comfort will improve Outcome: Progressing   

## 2022-08-22 NOTE — Consult Note (Signed)
Subjective:  Patient ID: [name], male    DOB: [birth year],  MRN: [id number]  Patient with past medical history of type 1 DM, HTN, neuropathy and history of fifth digit amputation with Dr. Jacqualyn Posey seen at beside today for blister on right foot. Initially seen at [facility] and was transferred down to [facility]. MRI was negative for osteo or abscess but consulted for management of blister on right foot. Patient denies any pain. Denies n.v.f.c.   Past Medical History:  Diagnosis Date   Complication of anesthesia    Hard to wake up when he was younger once.   Diabetes mellitus, type II ([place])    Heart murmur    Hypertension      Past Surgical History:  Procedure Laterality Date   AMPUTATION Right 05/21/2021   Procedure: AMPUTATION RAY 5th;  Surgeon: Trula Slade, DPM;  Location: [place];  Service: Podiatry;  Laterality: Right;   PALATE / UVULA BIOPSY / EXCISION     "growth removed"   UPPER GASTROINTESTINAL ENDOSCOPY     Done in [place] - 2013 ish   WOUND DEBRIDEMENT Right 05/24/2021   Procedure: RIGHT FOOT WOUND DEBRIDEMENT AND CLOSURE;  Surgeon: Evelina Bucy, DPM;  Location: [facility];  Service: Podiatry;  Laterality: Right;       Latest Ref Rng & Units 08/22/2022    2:46 AM 08/21/2022   11:52 AM 08/20/2022    8:39 PM  CBC  WBC 4.0 - 10.5 K/uL 10.4  12.4  13.8   Hemoglobin 13.0 - 17.0 g/dL 15.1  15.3  15.4   Hematocrit 39.0 - 52.0 % 45.3  45.2  45.8   Platelets 150 - 400 K/uL 285  280  303        Latest Ref Rng & Units 08/22/2022    2:46 AM 08/21/2022   11:52 AM 08/20/2022    8:39 PM  BMP  Glucose 70 - 99 mg/dL 83  115  103   BUN 6 - 20 mg/dL '12  11  15   '$ Creatinine 0.61 - 1.24 mg/dL 0.71  0.70  0.80   Sodium 135 - 145 mmol/L 139  140  139   Potassium 3.5 - 5.1 mmol/L 4.1  4.3  4.6   Chloride 98 - 111 mmol/L 103  105  101   CO2 22 - 32 mmol/L '25  25  27   '$ Calcium 8.9 - 10.3 mg/dL 9.7  9.8  9.8      Objective:   Vitals:   08/22/22 0431 08/22/22 0803  BP:  113/69 109/76  Pulse: 87 89  Resp: 17 17  Temp: 97.7 F (36.5 C) 97.8 F (36.6 C)  SpO2: 100% 99%    General:AA&O x 3. Normal mood and affect   Vascular: DP and PT pulses 2/4 bilateral. Brisk capillary refill to all digits. Pedal hair present   Neruological. Epicritic sensation grossly intact.   Derm: Right lateral foot blister noted over previous amputation site upon debridement very superficial wound with granular base measuring 2 cm x 3 cm x superficial. No erythema or edema present. Serous fluid expressed from blister No probe to bone. Interspaces clears of maceration. Nails well groomed and normal in appearance  MSK: MMT 5/5 in dorsiflexion, plantar flexion, inversion and eversion. Normal joint ROM without pain or crepitus.    Procedure: Excisional Debridement of Wound Rationale: Removal of non-viable soft tissue from the wound to promote healing.  Anesthesia: none Pre-Debridement Wound Measurements: Overlying blister  Post-Debridement Wound Measurements: 2 cm x 3 cm x superficial cm  Type of Debridement: Sharp Excisional Tissue Removed: Non-viable soft tissue Depth of Debridement: subcutaneous tissue. Technique: Sharp excisional debridement to bleeding, viable wound base.  Dressing: Dry, sterile, compression dressing. Disposition: Patient tolerated procedure well.  MRI right foot.  IMPRESSION: 1. Prior transmetatarsal amputation of the fifth ray at the level of the mid fifth metatarsal diaphysis. Minimal bone marrow edema at the resection margin without marrow replacement or cortical destruction to suggest acute osteomyelitis. 2. Well-circumscribed cutaneous blister at the lateral aspect of the distal forefoot measuring 2.7 x 1.1 x 2.6 cm. 3. Possible small adventitial bursa underlying the plantar-lateral aspect the fourth metatarsal head. 4. Subcutaneous edema over the dorsum of the foot, which may reflect cellulitis.  Assessment & Plan:  Patient was evaluated and  treated and all questions answered.  DX: Right foot blister with superficial wound in diabetic foot.  Wound care: betadine, DSD daily dressing  Antibiotics: Per primary . Will one week of oraldoxycycline upon discharge.  DME: Surgical shoe. Patient has one with him.   Discussed with patient diagnosis and treatment options.  Imaging reviewed. MRI negative for osteomyelitis or abscess Discussed treatment course with patient. He is ok for discharge from podiatry standpoint. Will plan for wound care and oral antibiotics and follow-up in one week in our clinic upon discharge.   Patient in agreement with plan and all questions answered.  Podiatry to sign off.   Lorenda Peck, DPM  Accessible via secure chat for questions or concerns.

## 2022-08-22 NOTE — Progress Notes (Signed)
[name]:[id number] DOB: September 14, 1996 DOA: 08/20/2022 PCP: Danna Hefty, DO   Subj: 26 y.o. 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 Earleen Newport)   Presents to the emergency department due to redness of right foot with swelling at site of prior amputation which started today.  He was admitted with concerns of cellulitis and abscess to the right foot in the setting of recent amputation.  MRI with no significant findings aside from some cellulitis.   Obj: Afebrile overnight.  Patient states negative pain, negative fever.  Noticed a blister over surgical site when he removed his sock.  States Dr. Celesta Gentile podiatry performed AMPUTATION RAY 5th (Right)21 May 2021.  States has no feeling in bilateral feet (bilateral DM neuropathy).   Objective: VITAL SIGNS: Temp: 97.8 F (36.6 C) (08/30 0803) Temp Source: Oral (08/30 0803) BP: 109/76 (08/30 0803) Pulse Rate: 89 (08/30 0803) SPO2; FIO2:   Intake/Output Summary (Last 24 hours) at 08/22/2022 1031 Last data filed at 08/22/2022 0745 Gross per 24 hour  Intake 1001.22 ml  Output 500 ml  Net 501.22 ml     Exam: General: A/O x4, No acute respiratory distress Lungs: Clear to auscultation bilaterally without wheezes or crackles Cardiovascular: Regular rate and rhythm without murmur gallop or rub normal S1 and S2 Abdomen: Nontender, nondistended, soft, bowel sounds positive, no rebound, no ascites, no appreciable mass Extremities: RIGHT foot bandaged, able to wiggle toes on command, neuropathy, erythematous over fourth/fifth metatarsal/surgical site Skin: Negative rashes, lesions, ulcers Psychiatric:  Negative depression, negative anxiety, negative fatigue, negative mania  Central nervous system:  Cranial nerves II through XII intact, tongue/uvula midline, all extremities muscle strength 5/5, sensation intact throughout, negative dysarthria,  negative expressive aphasia, negative receptive aphasia.  .     Mobility Assessment (last 72 hours)     Mobility Assessment     Row Name 08/21/22 2130 08/21/22 1215         Does patient have an order for bedrest or is patient medically unstable No - Continue assessment No - Continue assessment      What is the highest level of mobility based on the progressive mobility assessment? Level 5 (Walks with assist in room/hall) - Balance while stepping forward/back and can walk in room with assist - Complete Level 5 (Walks with assist in room/hall) - Balance while stepping forward/back and can walk in room with assist - Complete                 DVT prophylaxis: SCD Code Status: Full Family Communication:  Status is: Inpatient    Dispo: The patient is from: Home              Anticipated d/c is to: Home              Anticipated d/c date is: 3 days              Patient currently is not medically stable to d/c.      Consultants:  Dr. Elaina Hoops podiatry   Cultures   Antimicrobials: Anti-infectives (From admission, onward)    Start     Dose/Rate Route Frequency Ordered Stop   08/21/22 1000  vancomycin (VANCOREADY) IVPB 1750 mg/350 mL        1,750 mg 175 mL/hr over 120 Minutes Intravenous Every 12 hours 08/21/22 0237     08/21/22 0000  ceFEPIme (MAXIPIME) 2 g in sodium chloride  0.9 % 100 mL IVPB        2 g 200 mL/hr over 30 Minutes Intravenous Every 8 hours 08/20/22 2335     08/20/22 2345  vancomycin (VANCOREADY) IVPB 1500 mg/300 mL  Status:  Discontinued        1,500 mg 150 mL/hr over 120 Minutes Intravenous  Once 08/20/22 2335 08/21/22 0152   08/20/22 2145  vancomycin (VANCOCIN) IVPB 1000 mg/200 mL premix        1,000 mg 200 mL/hr over 60 Minutes Intravenous  Once 08/20/22 2139 08/20/22 2317        A/P Cellulitis and abscess of right foot Patient was started with IV vancomycin, we shall continue with vancomycin and cefepime Continue Tylenol as needed MRI  of right foot performed 8/29 with no findings of abscess or osteomyelitis, just some cellulitis New n.p.o. and IV fluid for now until podiatry evaluation Tried calling podiatry 8/29 with no response   Type 1 diabetes mellitus Continue ISS and hypoglycemia protocol   Essential hypertension-stable Continue losartan, metoprolol   GERD Continue Protonix   Mixed hyperlipidemia Continue Lipitor   Anxiety and depression Continue buspirone and Cymbalta   Insomnia Continue Ambien         Care during the described time interval was provided by me .  I have reviewed this patient's available data, including medical history, events of note, physical examination, and all test results as part of my evaluation.

## 2022-08-22 NOTE — Telephone Encounter (Signed)
Please advise 

## 2022-08-22 NOTE — Telephone Encounter (Signed)
Dr. Sherral Hammers called, states pt has been admitted to hospital in [place] 26. He wants to know who is doing rounds and if someone can  come to see this pt. I advised that Dr. Blenda Mounts is on call this week. He states that the pt has cellulitis and osteomyelitis in the Right 5th toe. He states this is the same surgical site that Dr. Jacqualyn Posey done a surgical amputation on back in 2021.   Dr Sherral Hammers can be called directly on his cell phone at [phone]  Please advise.

## 2022-08-23 ENCOUNTER — Other Ambulatory Visit (HOSPITAL_COMMUNITY): Payer: Self-pay

## 2022-08-23 DIAGNOSIS — F419 Anxiety disorder, unspecified: Secondary | ICD-10-CM | POA: Diagnosis not present

## 2022-08-23 DIAGNOSIS — R809 Proteinuria, unspecified: Secondary | ICD-10-CM

## 2022-08-23 DIAGNOSIS — E1029 Type 1 diabetes mellitus with other diabetic kidney complication: Secondary | ICD-10-CM

## 2022-08-23 DIAGNOSIS — F320 Major depressive disorder, single episode, mild: Secondary | ICD-10-CM | POA: Diagnosis not present

## 2022-08-23 DIAGNOSIS — E11628 Type 2 diabetes mellitus with other skin complications: Secondary | ICD-10-CM | POA: Diagnosis not present

## 2022-08-23 DIAGNOSIS — L03031 Cellulitis of right toe: Secondary | ICD-10-CM | POA: Diagnosis not present

## 2022-08-23 LAB — COMPREHENSIVE METABOLIC PANEL
ALT: 13 U/L (ref 0–44)
AST: 18 U/L (ref 15–41)
Albumin: 3.7 g/dL (ref 3.5–5.0)
Alkaline Phosphatase: 60 U/L (ref 38–126)
Anion gap: 12 (ref 5–15)
BUN: 14 mg/dL (ref 6–20)
CO2: 24 mmol/L (ref 22–32)
Calcium: 9.8 mg/dL (ref 8.9–10.3)
Chloride: 103 mmol/L (ref 98–111)
Creatinine, Ser: 0.67 mg/dL (ref 0.61–1.24)
GFR, Estimated: >60 mL/min (ref >60)
Glucose, Bld: 90 mg/dL (ref 70–99)
Potassium: 4.2 mmol/L (ref 3.5–5.1)
Sodium: 139 mmol/L (ref 135–145)
Total Bilirubin: 1 mg/dL (ref 0.3–1.2)
Total Protein: 7.3 g/dL (ref 6.5–8.1)

## 2022-08-23 LAB — CBC WITH DIFFERENTIAL/PLATELET
Abs Immature Granulocytes: 0.03 10*3/uL (ref 0.00–0.07)
Basophils Absolute: 0.1 10*3/uL (ref 0.0–0.1)
Basophils Relative: 1 %
Eosinophils Absolute: 0.3 10*3/uL (ref 0.0–0.5)
Eosinophils Relative: 3 %
HCT: 43.9 % (ref 39.0–52.0)
Hemoglobin: 14.9 g/dL (ref 13.0–17.0)
Immature Granulocytes: 0 %
Lymphocytes Relative: 30 %
Lymphs Abs: 3 10*3/uL (ref 0.7–4.0)
MCH: 28.9 pg (ref 26.0–34.0)
MCHC: 33.9 g/dL (ref 30.0–36.0)
MCV: 85.1 fL (ref 80.0–100.0)
Monocytes Absolute: 1 10*3/uL (ref 0.1–1.0)
Monocytes Relative: 10 %
Neutro Abs: 5.7 10*3/uL (ref 1.7–7.7)
Neutrophils Relative %: 56 %
Platelets: 278 10*3/uL (ref 150–400)
RBC: 5.16 MIL/uL (ref 4.22–5.81)
RDW: 13.1 % (ref 11.5–15.5)
WBC: 10.1 10*3/uL (ref 4.0–10.5)
nRBC: 0 % (ref 0.0–0.2)

## 2022-08-23 LAB — MAGNESIUM: Magnesium: 2 mg/dL (ref 1.7–2.4)

## 2022-08-23 LAB — GLUCOSE, CAPILLARY
Glucose-Capillary: 118 mg/dL — ABNORMAL HIGH (ref 70–99)
Glucose-Capillary: 136 mg/dL — ABNORMAL HIGH (ref 70–99)

## 2022-08-23 LAB — PHOSPHORUS: Phosphorus: 3.6 mg/dL (ref 2.5–4.6)

## 2022-08-23 MED ORDER — DOXYCYCLINE HYCLATE 100 MG PO TABS
100.0000 mg | ORAL_TABLET | Freq: Two times a day (BID) | ORAL | Status: DC
  Administered 2022-08-23: 100 mg via ORAL
  Filled 2022-08-23: qty 1

## 2022-08-23 MED ORDER — DOXYCYCLINE HYCLATE 100 MG PO TABS
100.0000 mg | ORAL_TABLET | Freq: Two times a day (BID) | ORAL | 0 refills | Status: DC
Start: 2022-08-23 — End: 2022-10-04
  Filled 2022-08-23: qty 14, 7d supply, fill #0

## 2022-08-23 NOTE — Discharge Summary (Signed)
Physician Discharge Summary  [name] BTD:[id number] DOB: Aug 25, 1996 DOA: 08/20/2022  PCP: Danna Hefty, DO  Admit date: 08/20/2022 Discharge date: 08/23/2022  Time spent: 35 minutes  Recommendations for Outpatient Follow-up:    Cellulitis and abscess of right foot -Received IV vancomycin and cefepime while hospitalized.  Will be discharged home on Doxycycline x7 days per podiatry recommendation - Podiatry Dr. Lorenda Peck Will plan for wound care and oral antibiotics and follow-up in one week in her clinic upon discharge.    DME: -Surgical shoe. Patient has one with him.  Wound care per podiatry: -betadine, DSD daily dressing   Type 1 diabetes mellitus -8/8 hemoglobin A1c= 5.3 - Restart home regimen  Essential HTN - Toprol 100 mg qhs  Mixed HLD - Lipitor 10 mg daily  GERD -Protonix 40 mg daily   Anxiety and Depression -Buspirone 5 mg BID -Cymbalta 30 mg daily   Insomnia -Ambien 10 mg qhs    Discharge Diagnoses:  Principal Problem:   Cellulitis and abscess of toe of right foot Active Problems:   Type 1 diabetes mellitus (HCC)   Essential hypertension   Anxiety   Gastroesophageal reflux disease   Insomnia   Mixed hyperlipidemia   Depression   Discharge Condition: Stable  Diet recommendation: Carb modified  There were no vitals filed for this visit.  History of present illness:  26 y.o. 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 Earleen Newport)    Presents to the emergency department due to redness of right foot with swelling at site of prior amputation which started today.  He was admitted with concerns of cellulitis and abscess to the right foot in the setting of recent amputation.  MRI with no significant findings aside from some cellulitis.    Hospital Course:  See above   Consultations: Dr. Elaina Hoops podiatry    Antibiotics Anti-infectives (From  admission, onward)    Start     Ordered Stop   08/23/22 0830  doxycycline (VIBRA-TABS) tablet 100 mg  Status:  Discontinued        08/23/22 0820 08/23/22 1659   08/23/22 0000  doxycycline (VIBRA-TABS) 100 MG tablet        08/23/22 0839     08/21/22 1000  vancomycin (VANCOREADY) IVPB 1750 mg/350 mL  Status:  Discontinued        08/21/22 0237 08/23/22 0820   08/21/22 0000  ceFEPIme (MAXIPIME) 2 g in sodium chloride 0.9 % 100 mL IVPB  Status:  Discontinued        08/20/22 2335 08/23/22 0820   08/20/22 2345  vancomycin (VANCOREADY) IVPB 1500 mg/300 mL  Status:  Discontinued        08/20/22 2335 08/21/22 0152   08/20/22 2145  vancomycin (VANCOCIN) IVPB 1000 mg/200 mL premix        08/20/22 2139 08/20/22 2317         Discharge Exam: Vitals:   08/22/22 1653 08/22/22 2023 08/23/22 0422 08/23/22 0824  BP: 119/80 109/79 92/74 103/73  Pulse: 87 95 92 88  Resp: 17 16 17 17   Temp: 98 F (36.7 C) 98.1 F (36.7 C) 97.7 F (36.5 C) (!) 97.5 F (36.4 C)  TempSrc:  Oral Oral Oral  SpO2: 100% 100% 98% 100%    General: A/O x4, No acute respiratory distress Lungs: Clear to auscultation bilaterally without wheezes or crackles Cardiovascular: Regular rate and rhythm without murmur gallop or rub  normal S1 and S2 Abdomen: Nontender, nondistended, soft, bowel sounds positive, no rebound, no ascites, no appreciable mass Extremities: RIGHT foot bandaged, able to wiggle toes on command, neuropathy, erythematous over fourth/fifth metatarsal/surgical site  Discharge Instructions   Allergies as of 08/23/2022       Reactions   Amoxicillin Hives   Has patient had a PCN reaction causing immediate rash, facial/tongue/throat swelling, SOB or lightheadedness with hypotension: No Has patient had a PCN reaction causing severe rash involving mucus membranes or skin necrosis: No Has patient had a PCN reaction that required hospitalization: No Has patient had a PCN reaction occurring within the last 10 years:  No If all of the above answers are "NO", then may proceed with Cephalosporin use.   Lexapro [escitalopram] Other (See Comments)        Medication List     STOP taking these medications    ibuprofen 800 MG tablet Commonly known as: ADVIL   losartan 25 MG tablet Commonly known as: COZAAR   multivitamin with minerals Tabs tablet   ondansetron 4 MG disintegrating tablet Commonly known as: Zofran ODT   Ozempic (2 MG/DOSE) 8 MG/3ML Sopn Generic drug: Semaglutide (2 MG/DOSE)   silver sulfADIAZINE 1 % cream Commonly known as: Silvadene       TAKE these medications    accu-chek soft touch lancets Use as instructed   accu-chek softclix lancets Use as instructed 4 x daily. e11.65   aspirin EC 81 MG tablet Take 1 tablet (81 mg total) by mouth daily.   atorvastatin 10 MG tablet Commonly known as: LIPITOR Take 1 tablet by mouth daily.   blood glucose meter kit and supplies Kit Dispense based on patient and insurance preference. Use up to four times daily as directed. (FOR ICD-10 E11.65).   busPIRone 5 MG tablet Commonly known as: BUSPAR Take 5 mg by mouth 2 (two) times daily.   CareTouch Pen Needles 31G X 6 MM Misc Generic drug: Insulin Pen Needle Use 6x a day   doxycycline 100 MG tablet Commonly known as: VIBRA-TABS Take 1 tablet (100 mg total) by mouth every 12 (twelve) hours.   DULoxetine 30 MG capsule Commonly known as: CYMBALTA Take 30 mg by mouth daily.   Farxiga 10 MG Tabs tablet Generic drug: dapagliflozin propanediol Take 10 mg by mouth daily.   Gvoke HypoPen 1-Pack 1 MG/0.2ML Soaj Generic drug: Glucagon Inject 1 mg under skin as needed for hypoglycemia   Lantus SoloStar 100 UNIT/ML Solostar Pen Generic drug: insulin glargine Inject 20 Units into the skin daily. What changed:  how much to take when to take this   Melatonin 5 MG Caps 1 capsule at bedtime as needed Orally Once a day   metoprolol succinate 100 MG 24 hr tablet Commonly known  as: TOPROL-XL Take 1 tablet (100 mg total) by mouth at bedtime. Take with or immediately following a meal.   nitroGLYCERIN 0.4 MG SL tablet Commonly known as: NITROSTAT as directed Sublingual   nitroGLYCERIN 0.4 MG SL tablet Commonly known as: Nitrostat Place 1 tablet (0.4 mg total) under the tongue every 5 (five) minutes as needed for chest pain.   omeprazole 20 MG capsule Commonly known as: PRILOSEC TAKE 1 CAPSULE BY MOUTH ONCE DAILY AS NEEDED FOR 90 DAYS for 90   OVER THE COUNTER MEDICATION Place 4-5 drops into the left ear daily as needed (ringing in ear). Ear ringing medication   zolpidem 10 MG tablet Commonly known as: AMBIEN Take 10 mg by mouth at  bedtime.       Allergies  Allergen Reactions   Amoxicillin Hives    Has patient had a PCN reaction causing immediate rash, facial/tongue/throat swelling, SOB or lightheadedness with hypotension: No Has patient had a PCN reaction causing severe rash involving mucus membranes or skin necrosis: No Has patient had a PCN reaction that required hospitalization: No Has patient had a PCN reaction occurring within the last 10 years: No If all of the above answers are "NO", then may proceed with Cephalosporin use.    Lexapro [Escitalopram] Other (See Comments)      The results of significant diagnostics from this hospitalization (including imaging, microbiology, ancillary and laboratory) are listed below for reference.    Significant Diagnostic Studies: MR FOOT RIGHT W WO CONTRAST  Result Date: 08/21/2022 CLINICAL DATA:  Osteomyelitis, foot EXAM: MRI OF THE RIGHT FOREFOOT WITHOUT AND WITH CONTRAST TECHNIQUE: Multiplanar, multisequence MR imaging of the right forefoot was performed before and after the administration of intravenous contrast. CONTRAST:  27m GADAVIST GADOBUTROL 1 MMOL/ML IV SOLN COMPARISON:  X-ray 08/20/2022 FINDINGS: Bones/Joint/Cartilage Prior transmetatarsal amputation of the fifth ray at the level of the mid fifth  metatarsal diaphysis. Minimal bone marrow edema along the resection margin without marrow replacement or cortical destruction. Remaining osseous structures of the forefoot demonstrate normal bone marrow signal. No marrow edema, marrow replacement, or periostitis. No fracture or dislocation. No joint effusions. Ligaments Intact Lisfranc ligament.  Intact collateral ligaments. Muscles and Tendons Denervation changes of the foot musculature. No tenosynovial fluid collection. Soft tissues Well-circumscribed cutaneous blister at the lateral aspect of the distal forefoot measuring 2.7 x 1.1 x 2.6 cm. Possible small adventitial bursa underlying the plantar-lateral aspect the fourth metatarsal head (series 5, image 23). No organized or rim enhancing fluid collection. There is subcutaneous edema over the dorsum of the foot. IMPRESSION: 1. Prior transmetatarsal amputation of the fifth ray at the level of the mid fifth metatarsal diaphysis. Minimal bone marrow edema at the resection margin without marrow replacement or cortical destruction to suggest acute osteomyelitis. 2. Well-circumscribed cutaneous blister at the lateral aspect of the distal forefoot measuring 2.7 x 1.1 x 2.6 cm. 3. Possible small adventitial bursa underlying the plantar-lateral aspect the fourth metatarsal head. 4. Subcutaneous edema over the dorsum of the foot, which may reflect cellulitis. Electronically Signed   By: NDavina PokeD.O.   On: 08/21/2022 09:31   DG Foot Complete Right  Result Date: 08/20/2022 CLINICAL DATA:  Redness and swelling, diabetes EXAM: RIGHT FOOT COMPLETE - 3+ VIEW COMPARISON:  11/10/2021 FINDINGS: There is previous partial amputation of fifth metatarsal along with a fifth toe. There is interval healing of fracture seen in the distal shaft of third metatarsal. There is marked focal soft tissue swelling lateral to the fourth metatarsophalangeal joint. No definite focal lytic lesions are seen. There are no pockets of air in  the soft tissues. IMPRESSION: No fracture or dislocation is seen. No focal lytic lesions are seen. If there is clinical suspicion for osteomyelitis, follow-up MRI should be considered. There is marked focal soft tissue swelling lateral to the fourth metatarsophalangeal joint suggesting soft tissue infection. Electronically Signed   By: PElmer PickerM.D.   On: 08/20/2022 20:57    Microbiology: No results found for this or any previous visit (from the past 240 hour(s)).   Labs: Basic Metabolic Panel: Recent Labs  Lab 08/20/22 2039 08/21/22 1152 08/22/22 0246 08/23/22 0109  NA 139 140 139 139  K 4.6 4.3 4.1 4.2  CL 101 105 103 103  CO2 27 25 25 24   GLUCOSE 103* 115* 83 90  BUN 15 11 12 14   CREATININE 0.80 0.70 0.71 0.67  CALCIUM 9.8 9.8 9.7 9.8  MG  --  2.0 2.1 2.0  PHOS  --  3.6  --  3.6   Liver Function Tests: Recent Labs  Lab 08/21/22 1152 08/23/22 0109  AST 18 18  ALT 14 13  ALKPHOS 69 60  BILITOT 1.5* 1.0  PROT 8.2* 7.3  ALBUMIN 4.1 3.7   No results for input(s): "LIPASE", "AMYLASE" in the last 168 hours. No results for input(s): "AMMONIA" in the last 168 hours. CBC: Recent Labs  Lab 08/20/22 2039 08/21/22 1152 08/22/22 0246 08/23/22 0109  WBC 13.8* 12.4* 10.4 10.1  NEUTROABS 10.1*  --   --  5.7  HGB 15.4 15.3 15.1 14.9  HCT 45.8 45.2 45.3 43.9  MCV 86.7 86.4 86.8 85.1  PLT 303 280 285 278   Cardiac Enzymes: No results for input(s): "CKTOTAL", "CKMB", "CKMBINDEX", "TROPONINI" in the last 168 hours. BNP: BNP (last 3 results) No results for input(s): "BNP" in the last 8760 hours.  ProBNP (last 3 results) No results for input(s): "PROBNP" in the last 8760 hours.  CBG: Recent Labs  Lab 08/22/22 1648 08/22/22 2023 08/22/22 2336 08/23/22 0423 08/23/22 0827  GLUCAP 132* 146* 81 118* 136*       Signed:  Dia Crawford, MD [facility]

## 2022-08-23 NOTE — Progress Notes (Signed)
Pt discharged home in stable condition. Wound redressed prior to discharge. Dressing supplies and education also provided to both the pt and his mom before leaving the unit

## 2022-08-23 NOTE — Discharge Instructions (Signed)
Wound care: betadine, DSD (dry sterile dressing) daily

## 2022-08-23 NOTE — Plan of Care (Signed)
  Problem: Education: Goal: Ability to describe self-care measures that may prevent or decrease complications (Diabetes Survival Skills Education) will improve Outcome: Progressing Goal: Individualized Educational Video(s) Outcome: Progressing   Problem: Coping: Goal: Ability to adjust to condition or change in health will improve Outcome: Progressing   Problem: Fluid Volume: Goal: Ability to maintain a balanced intake and output will improve Outcome: Progressing   Problem: Health Behavior/Discharge Planning: Goal: Ability to identify and utilize available resources and services will improve Outcome: Progressing Goal: Ability to manage health-related needs will improve Outcome: Progressing   Problem: Education: Goal: Ability to describe self-care measures that may prevent or decrease complications (Diabetes Survival Skills Education) will improve Outcome: Progressing Goal: Individualized Educational Video(s) Outcome: Progressing

## 2022-08-23 NOTE — Progress Notes (Signed)
Discharge instructions reviewed with pt and his mother.  Copy of instructions given to pt, script filled by [facility] and delivered to pt's room.   New ortho shoe ordered for pt by MD, waiting for ortho tech to deliver to room. Pt's primary RN at bedside changing pt's dsg and instructing pt and mother on dsg changes, supplies provided.

## 2022-08-23 NOTE — Progress Notes (Signed)
Orthopedic Tech Progress Note Patient Details:  [name] 12/05/1996 [id number]  Tammy, RN called saying Dr. Sherral Hammers ordered a new post op shoe for pt.  Ortho Devices Type of Ortho Device: Postop shoe/boot Ortho Device/Splint Location: RLE Ortho Device/Splint Interventions: Ordered, Application, Adjustment   Post Interventions Patient Tolerated: Well Instructions Provided: Adjustment of device, Care of device  Arville Go 08/23/2022, 12:33 PM

## 2022-08-24 ENCOUNTER — Ambulatory Visit: Payer: BC Managed Care – PPO | Attending: Podiatry | Admitting: Physical Therapy

## 2022-08-24 DIAGNOSIS — Z89431 Acquired absence of right foot: Secondary | ICD-10-CM | POA: Insufficient documentation

## 2022-08-24 DIAGNOSIS — M21961 Unspecified acquired deformity of right lower leg: Secondary | ICD-10-CM | POA: Insufficient documentation

## 2022-08-24 DIAGNOSIS — M6281 Muscle weakness (generalized): Secondary | ICD-10-CM | POA: Diagnosis present

## 2022-08-24 DIAGNOSIS — R262 Difficulty in walking, not elsewhere classified: Secondary | ICD-10-CM | POA: Insufficient documentation

## 2022-08-24 NOTE — Therapy (Signed)
[facility] [address]. [place], [place], [address] Phone: [phone]   Fax:  [phone]  Physical Therapy Evaluation  Patient Details  Name: [name] MRN: [id number] Date of Birth: [birth year] No data recorded  Encounter Date: 08/24/2022   PT End of Session - 08/24/22 1321     Visit Number 1    Number of Visits 1    Date for PT Re-Evaluation 08/25/22    PT Start Time 0738    PT Stop Time 0922    PT Time Calculation (min) 104 min    Activity Tolerance Patient tolerated treatment well;Patient limited by fatigue             Past Medical History:  Diagnosis Date   Complication of anesthesia    Hard to wake up when he was younger once.   Diabetes mellitus, type II ([place])    Heart murmur    Hypertension     Past Surgical History:  Procedure Laterality Date   AMPUTATION Right 05/21/2021   Procedure: AMPUTATION RAY 5th;  Surgeon: Trula Slade, DPM;  Location: [place] [facility];  Service: Podiatry;  Laterality: Right;   PALATE / UVULA BIOPSY / EXCISION     "growth removed"   UPPER GASTROINTESTINAL ENDOSCOPY     Done in [place] - 2013 ish   WOUND DEBRIDEMENT Right 05/24/2021   Procedure: RIGHT FOOT WOUND DEBRIDEMENT AND CLOSURE;  Surgeon: Evelina Bucy, DPM;  Location: [place] [facility];  Service: Podiatry;  Laterality: Right;    There were no vitals filed for this visit.    Subjective Assessment - 08/24/22 1320     Subjective See FCE report    Patient is accompained by: Family member    Limitations Walking;Standing    Currently in Pain? No/denies                    Plan - 08/24/22 1321     Clinical Impression Statement Overall Level of Work: Falls within the Medium range.  Exerting 20 to 50 pounds of force occasionally, and/or 10 to 25 pounds of force frequently, and/or greater than negligible up to 10 pounds of force constantly to move objects.  Physical Demand requirements are in excess of those for Light  Work.    Please see the Task Performance Table for specific abilities.  Tolerance for the 8-Hour Day: Based on the individual task scores in Dynamic Strength, Position Tolerance and Mobility, the client is able to tolerate the Medium level of work for the 8-hour day/40-hour week.    Comorbidities diabetes, PVD, peripheral neuropathy    Examination-Activity Limitations Locomotion Level;Bathing    Stability/Clinical Decision Making Evolving/Moderate complexity    Clinical Decision Making Moderate    Rehab Potential Fair    PT Frequency One time visit    PT Treatment/Interventions Gait training;Stair training;Functional mobility training;Therapeutic activities;Neuromuscular re-education;Patient/family education    PT Next Visit Plan FCE only.  Report faxed to referring MD office.             Patient will benefit from skilled therapeutic intervention in order to improve the following deficits and impairments:  Abnormal gait, Difficulty walking, Decreased range of motion, Pain, Decreased balance, Improper body mechanics, Postural dysfunction, Decreased strength  Visit Diagnosis: Difficulty in walking, not elsewhere classified  Muscle weakness (generalized)     Problem List Patient Active Problem List   Diagnosis Date Noted   Cellulitis and abscess of toe of right foot 08/20/2022  Depression 08/20/2022   Diabetes mellitus due to underlying condition with hyperglycemia, with long-term current use of insulin ([place]) 06/20/2021   Medication monitoring encounter 06/20/2021   SIRS (systemic inflammatory response syndrome) ([place]) 06/07/2021   Malnutrition of moderate degree 05/24/2021   Planned postoperative wound closure    Acute osteomyelitis of metatarsal bone of right foot ([place]) 05/20/2021   Mixed hyperlipidemia 05/20/2021   Anxiety 12/21/2020   Atypical chest pain 12/21/2020   Body mass index (BMI) 30.0-30.9, adult 12/21/2020   Chronic pain 12/21/2020   Disorder of teeth and  supporting structures, unspecified 12/21/2020   Epigastric pain 12/21/2020   Gastroesophageal reflux disease 12/21/2020   Headache disorder 12/21/2020   Insomnia 12/21/2020   Leukocytosis 12/21/2020   Oral mucositis (ulcerative), unspecified 12/21/2020   Panic disorder 12/21/2020   Poor sleep pattern 12/21/2020   Sinus tachycardia 08/67/6195   Systolic murmur 09/32/6712   Uncontrolled type 1 diabetes mellitus with hyperglycemia ([place]) 11/26/2018   Personal history of noncompliance with medical treatment, presenting hazards to health 03/15/2017   Essential hypertension 03/15/2017   Low back pain 03/14/2017   Neck pain 03/14/2017   Type 1 diabetes mellitus ([place]) 03/01/2017   Type 2 diabetes mellitus with hyperglycemia ([place]) 10/19/2015   Pura Spice, PT, DPT # [phone] 08/24/2022, 1:26 PM  [facility] [address] [place], [place], [address] Phone: [phone]   Fax:  [phone]  Name: [name] MRN: [id number] Date of Birth: 04/12/1996

## 2022-08-29 DIAGNOSIS — F419 Anxiety disorder, unspecified: Secondary | ICD-10-CM | POA: Diagnosis not present

## 2022-08-29 DIAGNOSIS — G4700 Insomnia, unspecified: Secondary | ICD-10-CM | POA: Diagnosis not present

## 2022-09-03 ENCOUNTER — Encounter: Payer: Self-pay | Admitting: Podiatry

## 2022-09-03 ENCOUNTER — Ambulatory Visit (INDEPENDENT_AMBULATORY_CARE_PROVIDER_SITE_OTHER): Payer: BC Managed Care – PPO | Admitting: Podiatry

## 2022-09-03 DIAGNOSIS — L84 Corns and callosities: Secondary | ICD-10-CM | POA: Diagnosis not present

## 2022-09-03 DIAGNOSIS — Z89431 Acquired absence of right foot: Secondary | ICD-10-CM

## 2022-09-03 DIAGNOSIS — E0865 Diabetes mellitus due to underlying condition with hyperglycemia: Secondary | ICD-10-CM | POA: Diagnosis not present

## 2022-09-03 DIAGNOSIS — Z794 Long term (current) use of insulin: Secondary | ICD-10-CM

## 2022-09-03 NOTE — Progress Notes (Signed)
  Subjective:  Patient ID: [name], male    DOB: [birth year],   MRN: [id number]  Chief Complaint  Patient presents with   Follow-up    Follow up from hospital visit.  Patient states that he has no pain at the moment and that he feels better. Patient was in the hospital for 3 days.    26 y.o. male presents for follow-up from the hospital. Was seen for concern of blister on right foot and concern for cellulitis. This was lanced in the hospital. Relates has been keeping betadine on it and doing very well.  . Denies any other pedal complaints. Denies n/v/f/c.   Past Medical History:  Diagnosis Date   Complication of anesthesia    Hard to wake up when he was younger once.   Diabetes mellitus, type II ([place])    Heart murmur    Hypertension     Objective:  Physical Exam: Vascular: DP/PT pulses 2/4 bilateral. CFT <3 seconds. Normal hair growth on digits. No edema.  Skin. No lacerations or abrasions bilateral feet. Right fifth metatarsal area wound healed with hyperkeratosis overlying. Area on left fifth as well.  Musculoskeletal: MMT 5/5 bilateral lower extremities in DF, PF, Inversion and Eversion. Deceased ROM in DF of ankle joint.  Neurological: Sensation intact to light touch.   Assessment:   1. Pre-ulcerative calluses   2. Diabetes mellitus due to underlying condition with hyperglycemia, with long-term current use of insulin ([place])   3. History of amputation of right foot ([place])      Plan:  Patient was evaluated and treated and all questions answered. Ulcer right lateral foot-healed -Debridement  of hyperkeratotic tissue preformed with out incident with chisel.  -Dressed with betadine and bandaid and recommend continue this for one more week, DSD. -May transition back into regular shoes.  -No abx indicated.  -Discussed glucose control and proper protein-rich diet.  -Discussed if any worsening redness, pain, fever or chills to call or may need to report to the emergency  room. Patient expressed understanding.   Follow-up in 4 weeks for recheck of wound area.   No follow-ups on file.   Lorenda Peck, DPM

## 2022-10-04 ENCOUNTER — Ambulatory Visit (INDEPENDENT_AMBULATORY_CARE_PROVIDER_SITE_OTHER): Payer: BC Managed Care – PPO

## 2022-10-04 ENCOUNTER — Ambulatory Visit (INDEPENDENT_AMBULATORY_CARE_PROVIDER_SITE_OTHER): Payer: BC Managed Care – PPO | Admitting: Podiatry

## 2022-10-04 DIAGNOSIS — L84 Corns and callosities: Secondary | ICD-10-CM | POA: Diagnosis not present

## 2022-10-04 DIAGNOSIS — Z794 Long term (current) use of insulin: Secondary | ICD-10-CM

## 2022-10-04 DIAGNOSIS — M21961 Unspecified acquired deformity of right lower leg: Secondary | ICD-10-CM

## 2022-10-04 DIAGNOSIS — E0865 Diabetes mellitus due to underlying condition with hyperglycemia: Secondary | ICD-10-CM

## 2022-10-04 DIAGNOSIS — L03115 Cellulitis of right lower limb: Secondary | ICD-10-CM

## 2022-10-04 DIAGNOSIS — L97512 Non-pressure chronic ulcer of other part of right foot with fat layer exposed: Secondary | ICD-10-CM

## 2022-10-04 MED ORDER — DOXYCYCLINE HYCLATE 100 MG PO TABS: 100.0000 mg | ORAL_TABLET | Freq: Two times a day (BID) | ORAL | 0 refills | Status: AC

## 2022-10-04 NOTE — Progress Notes (Signed)
Subjective:  Patient ID: [name], male    DOB: 19-Apr-1996,  MRN: [id number]  Chief Complaint  Patient presents with   Wound Check    Room 18  F/u on wound near 5 toe on right foot , pt states he also has a lot of skin peeling on his foot     26 y.o. male presents for follow-up on wound near the fifth toe on the right foot.  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  He still has a small wound that has failed to heal.  He does have some skin peeling from the area.  Denies any nausea vomiting fever chills or significant drainage from the area.  Past Medical History:  Diagnosis Date   Complication of anesthesia    Hard to wake up when he was younger once.   Diabetes mellitus, type II ([place])    Heart murmur    Hypertension     Allergies  Allergen Reactions   Amoxicillin Hives    Has patient had a PCN reaction causing immediate rash, facial/tongue/throat swelling, SOB or lightheadedness with hypotension: No Has patient had a PCN reaction causing severe rash involving mucus membranes or skin necrosis: No Has patient had a PCN reaction that required hospitalization: No Has patient had a PCN reaction occurring within the last 10 years: No If all of the above answers are "NO", then may proceed with Cephalosporin use.    Lexapro [Escitalopram] Other (See Comments)    ROS: Negative except as per HPI above  Objective:  General: AAO x3, NAD  Dermatological: With inspection and palpation of the right foot there is noted to be hyperkeratotic lesion with underlying superficial ulceration to the level of subcutaneous fat tissue approximately 0.2 x 0.2 cm present at the plantar aspect of the fourth metatarsal head near amputation site.  Slight erythema  of the 4th toe and lateral forefoot but no drainage from the wound.  Slightly tender to palpation at this area.  Vascular:  Dorsalis Pedis artery and Posterior Tibial artery pedal pulses are 2/4  bilateral.  Capillary fill time < 3 sec to all digits.   Neruologic: Grossly intact via light touch bilateral. Protective threshold intact to all sites bilateral.   Musculoskeletal: No gross boney pedal deformities bilateral. No pain, crepitus, or limitation noted with foot and ankle range of motion bilateral. Muscular strength 5/5 in all groups tested bilateral.  Gait: Unassisted, Nonantalgic.     Radiographs:  Date: 10/04/2022 XR right foot weightbearing AP/Lateral/Oblique   Findings: Attention directed the fourth metatarsal head there is no evidence of osseous erosion narrowing the fourth toe.  No evidence of soft tissue emphysema.  Edema of the lateral forefoot.  Status post partial fifth ray resection. Assessment:   1. Right foot ulcer, with fat layer exposed ([place])   2. Diabetes mellitus due to underlying condition with hyperglycemia, with long-term current use of insulin (HCC)   3. Cellulitis of right foot   4. Pre-ulcerative calluses   5. Metatarsal deformity, right      Plan:  Patient was evaluated and treated and all questions answered.  #Ulceration plantar aspect right foot subfourth metatarsal head to the level of subcutaneous tissue  -Notes with patient and given his small open wound and slight erythema of the area I would like to place on a course of doxycycline 100 mg twice daily for 14 days.  Continue to monitor for worsening of the redness or  any drainage swelling or nausea vomiting fever chills. -If he has any of the symptoms I recommend he go to the hospital for further evaluation. -Do not feel that the wound is deep enough to warrant further imaging with MRI at this time.  There is a possibility of residual osteomyelitis hide there is no evidence of that on x-ray or clinically at this time. -Recommend local wound care with daily antibiotic ointment and adhesive bandage applied to the area. -Recommend offloading with felt padding to keep pressure off the  area. -Patient will follow-up in 4 weeks for wound check.  Return in about 4 weeks (around 11/01/2022) for Follow-up right foot ulceration.          Everitt Amber, DPM [facility]

## 2022-10-09 ENCOUNTER — Encounter: Payer: Self-pay | Admitting: Podiatry

## 2022-10-25 DIAGNOSIS — E108 Type 1 diabetes mellitus with unspecified complications: Secondary | ICD-10-CM | POA: Diagnosis not present

## 2022-10-25 DIAGNOSIS — I959 Hypotension, unspecified: Secondary | ICD-10-CM | POA: Diagnosis not present

## 2022-10-25 DIAGNOSIS — Z713 Dietary counseling and surveillance: Secondary | ICD-10-CM | POA: Diagnosis not present

## 2022-10-25 DIAGNOSIS — R634 Abnormal weight loss: Secondary | ICD-10-CM | POA: Diagnosis not present

## 2022-10-25 DIAGNOSIS — R946 Abnormal results of thyroid function studies: Secondary | ICD-10-CM | POA: Diagnosis not present

## 2022-11-08 ENCOUNTER — Ambulatory Visit (INDEPENDENT_AMBULATORY_CARE_PROVIDER_SITE_OTHER): Payer: BC Managed Care – PPO | Admitting: Podiatry

## 2022-11-08 DIAGNOSIS — L84 Corns and callosities: Secondary | ICD-10-CM

## 2022-11-08 DIAGNOSIS — L97512 Non-pressure chronic ulcer of other part of right foot with fat layer exposed: Secondary | ICD-10-CM

## 2022-11-08 DIAGNOSIS — E0865 Diabetes mellitus due to underlying condition with hyperglycemia: Secondary | ICD-10-CM

## 2022-11-08 DIAGNOSIS — Z794 Long term (current) use of insulin: Secondary | ICD-10-CM

## 2022-11-08 DIAGNOSIS — L03115 Cellulitis of right lower limb: Secondary | ICD-10-CM

## 2022-11-08 DIAGNOSIS — L97521 Non-pressure chronic ulcer of other part of left foot limited to breakdown of skin: Secondary | ICD-10-CM

## 2022-11-08 MED ORDER — SILVER SULFADIAZINE 1 % EX CREA: 1.0000 | TOPICAL_CREAM | Freq: Every day | CUTANEOUS | 0 refills | Status: DC

## 2022-11-08 NOTE — Progress Notes (Signed)
Subjective:  Patient ID: [name], male    DOB: 08-15-1996,  MRN: [id number]  Chief Complaint  Patient presents with   Wound Check    Right foor ulcer follow up      26 y.o. male presents for follow-up on wound near the fifth toe on the right foot.  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  He was recently placed on an antibiotic course for 2 weeks related to some erythema he was having around the right lateral forefoot near the site of wound.  Says that this has decreased significantly since being on the antibiotic.  He has been doing dressing changes with a large adhesive bandage and Betadine.  Not having any pain at the area.  Denies any nausea vomiting fever chills.  Denies any drainage from the wound at this time.  Past Medical History:  Diagnosis Date   Complication of anesthesia    Hard to wake up when he was younger once.   Diabetes mellitus, type II ([place])    Heart murmur    Hypertension     Allergies  Allergen Reactions   Amoxicillin Hives    Has patient had a PCN reaction causing immediate rash, facial/tongue/throat swelling, SOB or lightheadedness with hypotension: No Has patient had a PCN reaction causing severe rash involving mucus membranes or skin necrosis: No Has patient had a PCN reaction that required hospitalization: No Has patient had a PCN reaction occurring within the last 10 years: No If all of the above answers are "NO", then may proceed with Cephalosporin use.    Lexapro [Escitalopram] Other (See Comments)    ROS: Negative except as per HPI above  Objective:  General: AAO x3, NAD  Dermatological: With inspection and palpation of the right foot there is noted to be hyperkeratotic lesion with underlying superficial ulceration to the level of subcutaneous fat tissue approximately 0.4 x 0.3 cm present at the plantar aspect of the fourth metatarsal head near amputation site.  Slight erythema  of the lateral  forefoot though improved from prior but no drainage from the wound.  Nontender to palpation  Attention directed to the left forefoot there is hyperkeratotic lesion present at the plantar aspect of the fifth metatarsal head.  Upon debridement there is mild opening underlying to breakdown of skin.  Vascular:  Dorsalis Pedis artery and Posterior Tibial artery pedal pulses are 2/4 bilateral.  Capillary fill time < 3 sec to all digits.   Neruologic: Grossly intact via light touch bilateral. Protective threshold intact to all sites bilateral.   Musculoskeletal: No gross boney pedal deformities bilateral. No pain, crepitus, or limitation noted with foot and ankle range of motion bilateral. Muscular strength 5/5 in all groups tested bilateral.  Gait: Unassisted, Nonantalgic.        Radiographs:  Date: 10/04/2022 XR right foot weightbearing AP/Lateral/Oblique   Findings: Attention directed the fourth metatarsal head there is no evidence of osseous erosion narrowing the fourth toe.  No evidence of soft tissue emphysema.  Edema of the lateral forefoot.  Status post partial fifth ray resection. Assessment:   1. Right foot ulcer, with fat layer exposed ([place])   2. Ulcer of left foot, limited to breakdown of skin (HCC)   3. Cellulitis of right foot   4. Pre-ulcerative calluses   5. Diabetes mellitus due to underlying condition with hyperglycemia, with long-term current use of insulin ([place])       Plan:  Patient  was evaluated and treated and all questions answered.  #Ulceration plantar aspect right foot subfourth metatarsal head to the level of subcutaneous tissue, improving after course of antibiotics and local wound care -Debridement as below - Continue to monitor for worsening of the redness or any drainage swelling or nausea vomiting fever chills. -If he has any of the symptoms I recommend he go to the hospital for further evaluation. -Clinically the concern will be for abscess in the area  however there is no significant erythema or evidence of fluctuance at the site.  No drainage from the wound.  Will defer further imaging with MRI for now -Recommend local wound care with daily silvadene cream and adhesive bandage applied to the area. E- Rx for Silvadene cream sent to the patient's pharmacy. -Recommend offloading with felt padding to keep pressure off the area. -Patient will follow-up in 4 weeks for wound check.  Procedure: Excisional Debridement of Wound Rationale: Removal of non-viable soft tissue from the wound to promote healing.  Anesthesia: none Post-Debridement Wound Measurements: 0.4 cm x 0.3 cm x 0.2 cm  Type of Debridement: Sharp Excisional with 15 blade scalpel Tissue Removed: Non-viable soft tissue Depth of Debridement: subcutaneous tissue. Technique: Sharp excisional debridement to bleeding, viable wound base.  Dressing: Dry, sterile, compression dressing. Disposition: Patient tolerated procedure well.    Return in about 4 weeks (around 12/06/2022) for follow up R foot ulcer.          Everitt Amber, DPM [facility]

## 2022-11-14 ENCOUNTER — Encounter: Payer: Self-pay | Admitting: Internal Medicine

## 2022-11-14 ENCOUNTER — Ambulatory Visit (INDEPENDENT_AMBULATORY_CARE_PROVIDER_SITE_OTHER): Payer: BC Managed Care – PPO | Admitting: Internal Medicine

## 2022-11-14 VITALS — BP 124/80 | HR 91 | Ht 75.0 in | Wt 210.2 lb

## 2022-11-14 DIAGNOSIS — R634 Abnormal weight loss: Secondary | ICD-10-CM | POA: Diagnosis not present

## 2022-11-14 DIAGNOSIS — E1142 Type 2 diabetes mellitus with diabetic polyneuropathy: Secondary | ICD-10-CM

## 2022-11-14 DIAGNOSIS — E1165 Type 2 diabetes mellitus with hyperglycemia: Secondary | ICD-10-CM

## 2022-11-14 DIAGNOSIS — R7989 Other specified abnormal findings of blood chemistry: Secondary | ICD-10-CM

## 2022-11-14 LAB — T4, FREE: Free T4: 0.87 ng/dL (ref 0.60–1.60)

## 2022-11-14 LAB — TSH: TSH: 1.54 u[IU]/mL (ref 0.35–5.50)

## 2022-11-14 LAB — POCT GLYCOSYLATED HEMOGLOBIN (HGB A1C): Hemoglobin A1C: 8.4 % — AB (ref 4.0–5.6)

## 2022-11-14 LAB — T3, FREE: T3, Free: 4.6 pg/mL — ABNORMAL HIGH (ref 2.3–4.2)

## 2022-11-14 MED ORDER — TRULICITY 1.5 MG/0.5ML ~~LOC~~ SOAJ: 1.5000 mg | SUBCUTANEOUS | 11 refills | Status: DC

## 2022-11-14 NOTE — Patient Instructions (Addendum)
Please continue: - Farxiga 10 mg daily - Lantus 24-26 units in am and  24-26 units at night  Increase: - Trulicity 1.5 mg weekly  Please let me know if the sugars remain high - in that case, will need mealtime insulin.  Please stop at the lab.  Please return in 3-4 months with your sugar log.

## 2022-11-14 NOTE — Progress Notes (Signed)
Patient ID: [name], male   DOB: 1996/07/18, 26 y.o.   MRN: [id number]   HPI: [name] is a 26 y.o.-year-old male, initially referred by his PCP, Dr. Angelia Mould returning for follow-up for DM, dx at 26 years old, insulin-dependent since 26 y/o, uncontrolled, with complications (autonomic neuropathy, PN, microalbuminuria, DR).  She saw Dr. Dorris Fetch in the past. He is here with his mother who offers part of the history, especially regarding his past medical history, medication regimen and blood sugars.  Last visit 3.5 months ago. He will restart M'aid 11/23/2022. He will also keep [place].  Interim history: No increased urination, blurry vision, nausea, chest pain. He gained 40 lbs since last OV.  Sugars worsened so PCP recently started him on low-dose Trulicity.  He also had to increase his insulin since last visit. He has a left foot ulcer -seeing podiatry. Has been an ABx.  Reviewed HbA1c: 10/25/2022: HbA1c 7.4% Lab Results  Component Value Date   HGBA1C 5.3 07/31/2022   HGBA1C 6.9 (A) 03/27/2022   HGBA1C 10.5 (H) 05/20/2021   HGBA1C 11.4 (A) 10/18/2020   HGBA1C 9.7 (H) 03/17/2019   HGBA1C 8.6 (H) 11/18/2018   HGBA1C 12.5 08/14/2018   HGBA1C 13 02/11/2017  01/10/2022: HbA1c 6.5% 08/2020: HbA1c 12.1% 04/2020: HbA1c 11.5%  Previously on: - Metformin ER 500 mg 2x a day, with meals - Victoza 1.2 mg daily in am - started by PCP on 09/28/2020 - no nausea - Lantus 77 units at bedtime >> split 35 units 2x a day on 09/28/2020 He was on Humalog 7-7-9 units before meals  - stopped when started Victoza Metformin IR >> nausea.  I recommended the following regimen: - Farxiga 10 before b'fast  -started 10/2021 -  >> off 62/70/3500 >> started Trulicity 9.38 mg weekly per PCP 1 mo ago (Ozempic on back order) - Lantus 35 >> 32-34 units 2x a day >> 25 units in a.m. and 40 units at night >> 20 units in am and 26 units at night >> 10 units 2x a day >> 24-26 units 2x a day He was previously  on metformin but developed nausea and stomach pain. He was previously on Humalog but this was stopped 10/2021 due to low blood sugars. Previously on Victoza, but this was stopped when switching to Ozempic.  Pt checks his sugars 2-3x a day: - am: 300-317 >> 140-160, 185 >> 40s-87, 93, 120 >> 336 - 2h after b'fast: n/c >> 182 - before lunch: 250 >> 120-140 >> 40s-80s >> 145 - 2h after lunch: n/c >> 231-253 - before dinner: 250 >> 140-160 >> 40s-80s >> n/c - 2h after dinner: n/c >> 359, 371 - bedtime: 200-310 >> n/c >> 181, 196 - nighttime: n/c Lowest sugar was 200 >> 40 (with Humalog), 110 >> 40 >> 145; ?  At which level he has hypoglycemia awareness. Highest sugar was 350 >> 210 >> 120 >> 371.  Glucometer: Accu-Chek >> ReliOn   Pt's meals are: - Breakfast: if eats b'fast: cornflakes + milk - Lunch: half a sandwich - Dinner: meat + veggies + starch - home cooked - Snacks: not usually He drinks 1.5-2 sweet tea a day.  - no CKD but he has a history of microalbuminuria, last BUN/creatinine:  10/25/2022: 14/0.89, GFR 123, Glu 136 Lab Results  Component Value Date   BUN 14 08/23/2022   BUN 12 08/22/2022   CREATININE 0.67 08/23/2022   CREATININE 0.71 08/22/2022   ACR: 10/25/2022: 15 07/07/2022: ACR 13  10/19/2015: ACR 78.8 07/28/2012: ACR 41.8 Not on ACE inhibitor/ARB.  -+ HL; last set of lipids: 10/25/2022: 123/168/60/38 07/07/2021: 106/152/33/50 10/19/2015: 202/110/63/117 No results found for: "CHOL", "HDL", "LDLCALC", "LDLDIRECT", "TRIG", "CHOLHDL" On Lipitor 10.  - last eye exam was in 2023. + DR reportedly.   - + numbness and tingling in his feet.  He has peripheral neuropathy for which she sees podiatry and neurology.  Last foot exam was 11/08/2022: Dr. Loel Lofty He had EMG/NCV: Message from Star Age, MD sent at 03/13/2022  5:12 PM EDT ----- Please call patient is him that his recent electrical nerve and muscle test through our office confirms the suspicion of nerve  damage, likely from his underlying diabetes.  As discussed, I would recommend ongoing strict diabetes control.  For now, I recommend he follow-up with his endocrinologist (diabetes specialist) and primary care physician closely.  Prev. On Reglan. On ASA 81.  Pt has FH of DM in MGM, MGGM, PGF - prediabetes.  He also has a history of HTN, cardiac murmur.  He had tachycardia and palpitations due to autoimmune.  He was started on carvedilol after an ED visit in 2019.  He continued to have shortness of breath and exertional fatigue. Stress test, 2D Echo and heart monitor >> normal in 2021.    He was  found to have slightly low  TSH levels: Lab Results  Component Value Date   TSH 0.31 (L) 07/31/2022   TSH 0.317 (L) 02/08/2022   TSH 0.277 (L) 06/07/2021   TSH 0.62 03/17/2019   TSH 1.02 11/18/2018   TSH 0.517 08/26/2018   TSH 0.41 08/14/2018   TSH 0.658 08/10/2018   Lab Results  Component Value Date   TSI <89 07/31/2022      2022-04-11    THYROGLOBULIN ANTIBODIES <1   < or = 1  THYROID PEROXIDASE ANTIBODIES 11   <9  TRAb (TSH Receptor Binding Antibody)   2022-04-11    TRAB <1.00   <=2.00  TSH+FREE T4   2022-04-11    TSH 0.31   0.40-4.50  T4, FREE 1.0   0.8-1.8   Further labs reviewed per  records from PCP: ACTH, PLASMA   2022-07-09    ACTH, PLASMA 19   6-50  CORTISOL, A.M.   2022-07-09    CORTISOL, A.M. 10.3      He is unemployed.  Applying for disability.  ROS: + see HPI  Past Medical History:  Diagnosis Date   Complication of anesthesia    Hard to wake up when he was younger once.   Diabetes mellitus, type II ([place])    Heart murmur    Hypertension    Past Surgical History:  Procedure Laterality Date   AMPUTATION Right 05/21/2021   Procedure: AMPUTATION RAY 5th;  Surgeon: Trula Slade, DPM;  Location: [facility];  Service: Podiatry;  Laterality: Right;   PALATE / UVULA BIOPSY / EXCISION     "growth removed"   UPPER GASTROINTESTINAL ENDOSCOPY     Done in [place]  - 2013 ish   WOUND DEBRIDEMENT Right 05/24/2021   Procedure: RIGHT FOOT WOUND DEBRIDEMENT AND CLOSURE;  Surgeon: Evelina Bucy, DPM;  Location: [facility];  Service: Podiatry;  Laterality: Right;   Social History   Socioeconomic History   Marital status: Single    Spouse name: Not on file   Number of children: Not on file   Years of education: Not on file   Highest education level: Not on file  Occupational History  Not on file  Tobacco Use   Smoking status: Never    Passive exposure: Yes   Smokeless tobacco: Never  Vaping Use   Vaping Use: Never used  Substance and Sexual Activity   Alcohol use: No   Drug use: No   Sexual activity: Not on file  Other Topics Concern   Not on file  Social History Narrative   Not on file   Social Determinants of Health   Financial Resource Strain: Not on file  Food Insecurity: Not on file  Transportation Needs: Not on file  Physical Activity: Not on file  Stress: Not on file  Social Connections: Not on file  Intimate Partner Violence: Not on file   Current Outpatient Medications on File Prior to Visit  Medication Sig Dispense Refill   aspirin EC 81 MG tablet Take 1 tablet (81 mg total) by mouth daily.     atorvastatin (LIPITOR) 10 MG tablet Take 1 tablet by mouth daily.     blood glucose meter kit and supplies KIT Dispense based on patient and insurance preference. Use up to four times daily as directed. (FOR ICD-10 E11.65). 1 each 5   busPIRone (BUSPAR) 5 MG tablet Take 5 mg by mouth 2 (two) times daily.     busPIRone (BUSPAR) 5 MG tablet 1 tablet Orally Twice a day for 90 days     DULoxetine (CYMBALTA) 30 MG capsule Take 30 mg by mouth daily.     DULoxetine (CYMBALTA) 30 MG capsule 1 capsule Orally Once a day for 90 days     FARXIGA 10 MG TABS tablet Take 10 mg by mouth daily.     GVOKE HYPOPEN 1-PACK 1 MG/0.2ML SOAJ Inject 1 mg under skin as needed for hypoglycemia 0.2 mL prn   hydrOXYzine (ATARAX) 50 MG tablet 1 tablet as needed  Orally Once a day for 90 days     ibuprofen (ADVIL) 800 MG tablet Take 800 mg by mouth 3 (three) times daily as needed.     insulin glargine (LANTUS SOLOSTAR) 100 UNIT/ML Solostar Pen Inject 20 Units into the skin daily. (Patient taking differently: Inject 13 Units into the skin 2 (two) times daily.) 45 mL 3   Insulin Pen Needle (CARETOUCH PEN NEEDLES) 31G X 6 MM MISC Use 6x a day 400 each 3   Lancet Devices (ACCU-CHEK SOFTCLIX) lancets Use as instructed 4 x daily. e11.65 150 each 5   Lancets (ACCU-CHEK SOFT TOUCH) lancets Use as instructed 100 each 12   Melatonin 5 MG CAPS 1 capsule at bedtime as needed Orally Once a day     melatonin 5 MG TABS 1 tablet in the evening Orally     metoprolol succinate (TOPROL-XL) 100 MG 24 hr tablet Take 1 tablet (100 mg total) by mouth at bedtime. Take with or immediately following a meal. 90 tablet 3   nitroGLYCERIN (NITROSTAT) 0.4 MG SL tablet Place 1 tablet (0.4 mg total) under the tongue every 5 (five) minutes as needed for chest pain. 25 tablet 3   nitroGLYCERIN (NITROSTAT) 0.4 MG SL tablet as directed Sublingual     omeprazole (PRILOSEC) 20 MG capsule TAKE 1 CAPSULE BY MOUTH ONCE DAILY AS NEEDED FOR 90 DAYS for 90 (Patient not taking: Reported on 08/21/2022)     OVER THE COUNTER MEDICATION Place 4-5 drops into the left ear daily as needed (ringing in ear). Ear ringing medication     silver sulfADIAZINE (SILVADENE) 1 % cream Apply 1 Application topically daily. 50 g 0  zolpidem (AMBIEN) 10 MG tablet Take 10 mg by mouth at bedtime.     No current facility-administered medications on file prior to visit.   Allergies  Allergen Reactions   Amoxicillin Hives    Has patient had a PCN reaction causing immediate rash, facial/tongue/throat swelling, SOB or lightheadedness with hypotension: No Has patient had a PCN reaction causing severe rash involving mucus membranes or skin necrosis: No Has patient had a PCN reaction that required hospitalization: No Has  patient had a PCN reaction occurring within the last 10 years: No If all of the above answers are "NO", then may proceed with Cephalosporin use.    Lexapro [Escitalopram] Other (See Comments)   Family History  Problem Relation Age of Onset   Healthy Mother    Healthy Father    Healthy Sister    Healthy Sister    Diabetes Maternal Grandmother    Thyroid disease Maternal Grandmother    Hypertension Maternal Grandmother    Neuropathy Maternal Grandmother     PE: BP 124/80 (BP Location: Left Arm, Patient Position: Sitting, Cuff Size: Normal)   Pulse 91   Ht _0  (1.905 m)   Wt 210 lb 3.2 oz (95.3 kg)   SpO2 98%   BMI 26.27 kg/m  Wt Readings from Last 3 Encounters:  11/14/22 210 lb 3.2 oz (95.3 kg)  07/31/22 172 lb (78 kg)  07/17/22 170 lb (77.1 kg)   Constitutional: Normal weight, in NAD Eyes:no exophthalmos ENT: no thyromegaly, no cervical lymphadenopathy Cardiovascular: Tachycardia, RR, No MRG Respiratory: CTA B Musculoskeletal: no deformities Skin: moist, warm, + macular rash bilateral shins and feet Neurological: no tremor with outstretched hands  ASSESSMENT: 1. DM2, insulin-dependent, uncontrolled, with complications - DR - Autonomic neuropathy - PN - Microalbuminuria  Component     Latest Ref Rng 07/31/2022  Glucose     65 - 99 mg/dL 67   Glutamic Acid Decarb Ab     <5 IU/mL <5   Islet Cell Ab     Neg:<1:1  Negative   C-Peptide     0.80 - 3.85 ng/mL 0.76 (L)   ZNT8 Antibodies     <15 U/mL <10   Labs confirm type 1 diabetes with low insulin production.  2.  Weight loss  3.  Hashimoto's thyroiditis  PLAN:  1. Patient with longstanding, uncontrolled, insulin-dependent diabetes, with dramatic weight loss and diabetes improvement at last visit and frequent low blood sugars in the 40s, along with an HbA1c of 5.6%.  At that time, PCP took him off Ozempic and I advised him to stay off Ozempic and reduce his Lantus dose. -At last visit we also checked him for  insulin deficiency and his C-peptide was low while in the pancreatic antibodies were not elevated. -At today's visit, sugars are much higher.  He had to increase the Lantus dose since last visit to more than doubled.  Also, his PCP started him on Trulicity approximately 3 to 4 weeks ago.  He tolerates it well, without significantly decreased appetite.  At today's visit, we discussed about trying to increase the Trulicity slowly, since he had a significant response to the GLP-1 receptor agonist in the past.  I am hoping that with the increase in Trulicity dose, we may bypass the need for mealtime insulin, but for now, I advised him to let me know if the sugars do not improve within the next 1 to 2 weeks, in which case, we will need to add back rapid acting insulin.  They agree with the plan. - I suggested to:  Patient Instructions  Please continue: - Farxiga 10 mg daily - Lantus 24-26 units in am and  24-26 units at night  Increase: - Trulicity 1.5 mg weekly  Please let me know if the sugars remain high - in that case, will need mealtime insulin.  Please stop at the lab.  Please return in 3-4 months with your sugar log.   - we checked his HbA1c: 8.4% (higher) - advised to check sugars at different times of the day - 2x a day, rotating check times - advised for yearly eye exams >> he is UTD - need records - return to clinic in 3-4 months  2.  Weight loss -He lost a significant amount of weight: 65 pounds before the last 2 visits (10 pounds before last visit) after starting Ozempic -He was checked for adrenal insufficiency by PCP and had a normal a.m. cortisol and ACTH -He did have a low TSH during investigation by PCP and at last visit, TSH was again slightly low -At last visit we stopped Ozempic and I also advised him to decrease Lantus. Since then, sugars worsened >> he had to increase Lantus -he gained 40 lbs since last OV >> PCP started him obn Trulicity >> will continue and increase  dose.  3.  Hashimoto's thyroiditis -Please also see problem #2 -He is taking antibodies were slightly positive and TRAb antibodies were not elevated -TSH was 0.31 at last visit; free thyroid hormones were normal -He could have mild Graves' disease versus thyrotoxic phase of Hashimoto's thyroiditis -I will recheck his TFTs today  Component     Latest Ref Rng 11/14/2022  TSH     0.35 - 5.50 uIU/mL 1.54   Hemoglobin A1C     4.0 - 5.6 % 8.4 !   T4,Free(Direct)     0.60 - 1.60 ng/dL 0.87   Triiodothyronine,Free,Serum     2.3 - 4.2 pg/mL 4.6 (H)   Free T3 is slightly high, while TSH and free T4 are normal.  Picture consistent with resolving thyroiditis.  We will continue to keep an eye on his thyroid tests but no intervention is needed for now.  Philemon Kingdom, MD PhD [facility]

## 2022-11-16 ENCOUNTER — Encounter: Payer: Self-pay | Admitting: Podiatry

## 2022-11-20 NOTE — Telephone Encounter (Signed)
Please schedule

## 2022-11-22 DIAGNOSIS — F419 Anxiety disorder, unspecified: Secondary | ICD-10-CM | POA: Diagnosis not present

## 2022-11-22 DIAGNOSIS — G4700 Insomnia, unspecified: Secondary | ICD-10-CM | POA: Diagnosis not present

## 2022-11-23 ENCOUNTER — Other Ambulatory Visit: Payer: Self-pay | Admitting: Podiatry

## 2022-12-06 ENCOUNTER — Ambulatory Visit (INDEPENDENT_AMBULATORY_CARE_PROVIDER_SITE_OTHER): Payer: BC Managed Care – PPO | Admitting: Podiatry

## 2022-12-06 DIAGNOSIS — L97521 Non-pressure chronic ulcer of other part of left foot limited to breakdown of skin: Secondary | ICD-10-CM

## 2022-12-06 DIAGNOSIS — L97512 Non-pressure chronic ulcer of other part of right foot with fat layer exposed: Secondary | ICD-10-CM | POA: Diagnosis not present

## 2022-12-06 NOTE — Progress Notes (Signed)
Subjective:  Patient ID: [name], male    DOB: 1996/06/19,  MRN: [id number]  Chief Complaint  Patient presents with   Foot Ulcer    Ulcer on the right lateral side of foot, Ulcer has been on foot for a few weeks, draining, red, no pain.treatment includes betadine and sulfa     26 y.o. male presents for follow-up on wound near the fifth toe on the right foot.  He has recently noticed some drainage from the area however he thinks it is improved.  He has been on antibiotics but is now off them.  Thinks the redness and drainage have decreased significantly.  He also notes that the left foot wound is improved from prior.  He has been using Betadine and adhesive bandages for dressings.  Denies any nausea vomiting fever chills at this time.  Past Medical History:  Diagnosis Date   Complication of anesthesia    Hard to wake up when he was younger once.   Diabetes mellitus, type II ([place])    Heart murmur    Hypertension     Allergies  Allergen Reactions   Amoxicillin Hives    Has patient had a PCN reaction causing immediate rash, facial/tongue/throat swelling, SOB or lightheadedness with hypotension: No Has patient had a PCN reaction causing severe rash involving mucus membranes or skin necrosis: No Has patient had a PCN reaction that required hospitalization: No Has patient had a PCN reaction occurring within the last 10 years: No If all of the above answers are "NO", then may proceed with Cephalosporin use.    Lexapro [Escitalopram] Other (See Comments)    ROS: Negative except as per HPI above  Objective:  General: AAO x3, NAD  Dermatological: With inspection and palpation of the right foot there is noted to be hyperkeratotic lesion with underlying superficial ulceration to the level of subcutaneous fat tissue approximately 0.4 x 0.3 cm present at the plantar aspect of the fourth metatarsal head near amputation site.  No erythema present.  Nontender to  palpation    Attention directed to the left forefoot there is hyperkeratotic lesion present at the plantar aspect of the fifth metatarsal head.  Upon debridement there is mild opening underlying to breakdown of skin approximately 0.2 x 0.2 cm.  Vascular:  Dorsalis Pedis artery and Posterior Tibial artery pedal pulses are 2/4 bilateral.  Capillary fill time < 3 sec to all digits.   Neruologic: Grossly intact via light touch bilateral. Protective threshold intact to all sites bilateral.   Musculoskeletal: Status post right fifth toe amputation.  Gait: Unassisted, Nonantalgic.     Radiographs:  Date: 10/04/2022 XR right foot weightbearing AP/Lateral/Oblique   Findings: Attention directed the fourth metatarsal head there is no evidence of osseous erosion narrowing the fourth toe.  No evidence of soft tissue emphysema.  Edema of the lateral forefoot.  Status post partial fifth ray resection. Assessment:   1. Right foot ulcer, with fat layer exposed ([place])   2. Ulcer of left foot, limited to breakdown of skin [facility])        Plan:  Patient was evaluated and treated and all questions answered.  #Ulceration plantar aspect right foot subfourth metatarsal head to the level of subcutaneous tissue, improving after course of antibiotics and local wound care -Debridement as below - Continue to monitor for worsening of the redness or any drainage swelling or nausea vomiting fever chills. -If he has any of the symptoms I recommend he go to the hospital for  further evaluation. -Recommend local wound care with daily silvadene cream and adhesive bandage applied to the area. -Recommend offloading with felt padding to keep pressure off the area. -Patient will follow-up in 4 weeks for wound check. -Will place referral for wound care center at this time for more frequent wound care for bilateral lower extremity ulcerations.  Procedure: Excisional Debridement of Wound Rationale: Removal of non-viable  soft tissue from the wound to promote healing.  Anesthesia: none Post-Debridement Wound Measurements: 0.4 cm x 0.3 cm x 0.2 cm  Type of Debridement: Sharp Excisional with 15 blade scalpel Tissue Removed: Non-viable soft tissue Depth of Debridement: subcutaneous tissue. Technique: Sharp excisional debridement to bleeding, viable wound base.  Dressing: Dry, sterile, compression dressing. Disposition: Patient tolerated procedure well.    Return in about 4 weeks (around 01/03/2023) for follow up bilateral 5th met head ulcerations.          Everitt Amber, DPM [facility]

## 2022-12-27 ENCOUNTER — Encounter (HOSPITAL_BASED_OUTPATIENT_CLINIC_OR_DEPARTMENT_OTHER): Payer: BC Managed Care – PPO | Attending: Internal Medicine | Admitting: Internal Medicine

## 2022-12-27 DIAGNOSIS — Z89421 Acquired absence of other right toe(s): Secondary | ICD-10-CM | POA: Insufficient documentation

## 2022-12-27 DIAGNOSIS — E1040 Type 1 diabetes mellitus with diabetic neuropathy, unspecified: Secondary | ICD-10-CM | POA: Insufficient documentation

## 2022-12-27 DIAGNOSIS — M869 Osteomyelitis, unspecified: Secondary | ICD-10-CM | POA: Insufficient documentation

## 2022-12-27 DIAGNOSIS — L97528 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ty: Secondary | ICD-10-CM | POA: Insufficient documentation

## 2022-12-27 DIAGNOSIS — L97518 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ity: Secondary | ICD-10-CM | POA: Diagnosis not present

## 2022-12-27 DIAGNOSIS — E10621 Type 1 diabetes mellitus with foot ulcer: Secondary | ICD-10-CM | POA: Diagnosis not present

## 2022-12-27 DIAGNOSIS — I10 Essential (primary) hypertension: Secondary | ICD-10-CM | POA: Diagnosis not present

## 2022-12-28 ENCOUNTER — Encounter (HOSPITAL_COMMUNITY): Payer: Self-pay | Admitting: Internal Medicine

## 2022-12-28 DIAGNOSIS — S91301A Unspecified open wound, right foot, initial encounter: Secondary | ICD-10-CM | POA: Diagnosis not present

## 2022-12-28 DIAGNOSIS — E11621 Type 2 diabetes mellitus with foot ulcer: Secondary | ICD-10-CM | POA: Diagnosis not present

## 2022-12-28 DIAGNOSIS — S91109A Unspecified open wound of unspecified toe(s) without damage to nail, initial encounter: Secondary | ICD-10-CM | POA: Diagnosis not present

## 2022-12-28 NOTE — Progress Notes (Signed)
[name] ([id number]) [id number] Page 1 of 12 Visit Report for 12/27/2022 Chief Complaint Document Details Patient Name: Date of Service: [name], [name] 12/27/2022 9:00 A M Medical Record Number: [id number] Patient Account Number: [id number] Date of Birth/Sex: Treating RN: March 17, 1996 (27 y.o. M) Primary Care Provider: Mina Marble Other Clinician: Referring Provider: Treating Provider/Extender: Tonita Cong in Treatment: 0 Information Obtained from: Patient Chief Complaint 12/27/2022; bilateral diabetic foot wounds Electronic Signature(s) Signed: 12/27/2022 11:34:30 AM By: Kalman Shan DO Entered By: Kalman Shan on 12/27/2022 10:50:56 -------------------------------------------------------------------------------- Debridement Details Patient Name: Date of Service: [name] 12/27/2022 9:00 A M Medical Record Number: [id number] Patient Account Number: [id number] Date of Birth/Sex: Treating RN: [birth year] (27 y.o. Lorette Ang, Meta.Reding Primary Care Provider: Mina Marble Other Clinician: Referring Provider: Treating Provider/Extender: Tonita Cong in Treatment: 0 Debridement Performed for Assessment: Wound #1 Right Metatarsal head fifth Performed By: Physician Kalman Shan, DO Debridement Type: Debridement Severity of Tissue Pre Debridement: Fat layer exposed Level of Consciousness (Pre-procedure): Awake and Alert Pre-procedure Verification/Time Out Yes - 09:50 Taken: Start Time: 09:51 Pain Control: Lidocaine 4% T opical Solution T Area Debrided (L x W): otal 1 (cm) x 1 (cm) = 1 (cm) Tissue and other material debrided: Viable, Non-Viable, Callus, Slough, Subcutaneous, Skin: Dermis , Skin: Epidermis, Slough Level: Skin/Subcutaneous Tissue Debridement Description: Excisional Instrument: Curette Bleeding: Minimum Hemostasis Achieved: Pressure End Time:  09:59 Procedural Pain: 0 Post Procedural Pain: 0 Response to Treatment: Procedure was tolerated well Level of Consciousness (Post- Awake and Alert procedure): Post Debridement Measurements of Total Wound Length: (cm) 1 Width: (cm) 1 Depth: (cm) 0.2 Volume: (cm) 0.157 Character of Wound/Ulcer Post Debridement: Improved Severity of Tissue Post Debridement: Fat layer exposed CALISTRO, RAUF E ([id number]) [id number] Page 2 of 12 Post Procedure Diagnosis Same as Pre-procedure Electronic Signature(s) Signed: 12/27/2022 11:34:30 AM By: Kalman Shan DO Signed: 12/27/2022 6:11:27 PM By: Deon Pilling RN, BSN Entered By: Deon Pilling on 12/27/2022 09:59:43 -------------------------------------------------------------------------------- Debridement Details Patient Name: Date of Service: [name] 12/27/2022 9:00 A M Medical Record Number: [id number] Patient Account Number: [id number] Date of Birth/Sex: Treating RN: 1996/08/05 (27 y.o. Hessie Diener Primary Care Provider: Mina Marble Other Clinician: Referring Provider: Treating Provider/Extender: Tonita Cong in Treatment: 0 Debridement Performed for Assessment: Wound #2 Left Metatarsal head fifth Performed By: Physician Kalman Shan, DO Debridement Type: Debridement Severity of Tissue Pre Debridement: Fat layer exposed Level of Consciousness (Pre-procedure): Awake and Alert Pre-procedure Verification/Time Out Yes - 09:50 Taken: Start Time: 09:51 Pain Control: Lidocaine 4% T opical Solution T Area Debrided (L x W): otal 1 (cm) x 1 (cm) = 1 (cm) Tissue and other material debrided: Viable, Non-Viable, Callus, Slough, Subcutaneous, Skin: Dermis , Skin: Epidermis, Slough Level: Skin/Subcutaneous Tissue Debridement Description: Excisional Instrument: Curette Bleeding: Minimum Hemostasis Achieved: Pressure End Time: 09:59 Procedural Pain: 0 Post  Procedural Pain: 0 Response to Treatment: Procedure was tolerated well Level of Consciousness (Post- Awake and Alert procedure): Post Debridement Measurements of Total Wound Length: (cm) 0.2 Width: (cm) 0.3 Depth: (cm) 0.3 Volume: (cm) 0.014 Character of Wound/Ulcer Post Debridement: Improved Severity of Tissue Post Debridement: Fat layer exposed Post Procedure Diagnosis Same as Pre-procedure Electronic Signature(s) Signed: 12/27/2022 11:34:30 AM By: Kalman Shan DO Signed: 12/27/2022 6:11:27 PM By: Deon Pilling RN, BSN Entered By: Deon Pilling on 12/27/2022 10:00:00 HPI Details -------------------------------------------------------------------------------- [name] ([id number]) [id number] Page 3 of 12 Patient Name: Date of Service: [name]. 12/27/2022 9:00  A M Medical Record Number: [id number] Patient Account Number: [id number] Date of Birth/Sex: Treating RN: Jun 14, 1996 (27 y.o. 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 He has been following with podiatry for this issue. He reports using Silvadene to the areas. Currently not using anything for offloading. He states that the right foot wound has been present for the past 1-1/2 years. He states that the left foot wound appeared about 3 months ago. He has not had recent imaging of the areas. Currently denies systemic signs of infection. Electronic Signature(s) Signed: 12/27/2022 2:54:58 PM By: Kalman Shan DO Previous Signature: 12/27/2022 11:34:30 AM Version  By: Kalman Shan DO Entered By: Kalman Shan on 12/27/2022 14:46:07 -------------------------------------------------------------------------------- Physical Exam Details Patient Name: Date of Service: [name] 12/27/2022 9:00 A M Medical Record Number: [id number] Patient Account Number: [id number] Date of Birth/Sex: Treating RN: [birth year] (27 y.o. M) Primary Care Provider: Mina Marble Other Clinician: Referring Provider: Treating Provider/Extender: Tonita Cong in Treatment: 0 Constitutional respirations regular, non-labored and within target range for patient.. Cardiovascular 2+ dorsalis pedis/posterior tibialis pulses. Psychiatric pleasant and cooperative. Notes Bilateral fifth met head wounds to the feet. On the left side there is significant undermining. There is nonviable tissue, granulation tissue and callus. T the right o side there is increased depth but does not probe to bone. T the wound bed there is callus, granulation tissue and nonviable tissue. No signs of infection to the o wound beds including increased warmth, erythema or purulent drainage. Electronic Signature(s) Signed: 12/27/2022 2:54:58 PM By: Kalman Shan DO Entered By: Kalman Shan on 12/27/2022 14:47:30 -------------------------------------------------------------------------------- Physician Orders Details Patient Name: Date of Service: DAETON, KLUTH 12/27/2022 9:00 A M Medical Record Number: [id number] Patient Account Number: [id number] Date of Birth/Sex: Treating RN: 02-Jun-1996 (27 y.o. 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 - Thursday with Dr. Heber Harrington on 01/03/2023 0845 Return A ppointment in 2 weeks. - Thursday with Dr. Heber [place] on 01/10/2023 1030 Return A Other: - Someone will Anesthetic (In clinic) Topical Lidocaine 4% applied to wound bed Bathing/ Shower/ Hygiene May shower with protection but do not get wound dressing(s) wet. Protect dressing(s) with water repellant cover (for example, large plastic bag) or a cast cover and may then take shower. Off-Loading Open toe surgical shoe to: - both surgical shoes with PEG ASSIST to both shoes. Wear while walking and standing. DO not walk barefoot or just in socks. Additional Orders / Instructions Follow Nutritious Diet - CLOSELY MONITOR BLOOD SUGAR. Wound Treatment Wound #1 - Metatarsal head fifth Wound Laterality: Right Cleanser: Wound Cleanser (DME) (Generic) 1 x Per Day/30 Days Discharge Instructions: Cleanse the wound with wound cleanser prior to applying a clean dressing using gauze  sponges, not tissue or cotton balls. Peri-Wound Care: Skin Prep (DME) (Generic) 1 x Per Day/30 Days Discharge Instructions: Use skin prep as directed Prim Dressing: MediHoney Gel, tube 1.5 (oz) 1 x Per Day/30 Days ary Discharge Instructions: Apply to wound bed as instructed Prim Dressing: Sorbalgon AG Dressing 2x2 (in/in) (DME) (Generic) 1 x Per Day/30 Days ary Discharge Instructions: ***APPLY OVER THE MEDIHONEY AND DO NOT PACK THE WOUND.*** Secondary Dressing: Optifoam Non-Adhesive Dressing, 4x4 in (DME) (Generic) 1 x Per Day/30 Days Discharge Instructions: Apply ONE LAYER OF FOAM D Secondary Dressing: Woven Gauze Sponges 2x2 in (DME) (Generic) 1 x Per Day/30 Days Discharge Instructions: Apply over primary  dressing as directed. Secured With: Child psychotherapist, Sterile 2x75 (in/in) (DME) (Generic) 1 x Per Day/30 Days Discharge Instructions: Secure with stretch gauze as directed. Secured With: 65M Medipore H Soft Cloth Surgical T ape, 4 x 10 (in/yd) (DME) (Generic) 1 x Per Day/30 Days Discharge Instructions: Secure with tape as directed. Wound #2 - Metatarsal head fifth Wound Laterality: Left Cleanser: Wound Cleanser (DME) (Generic) 1 x Per Day/30 Days Discharge Instructions: Cleanse the wound with wound cleanser prior to applying a clean dressing using gauze sponges, not tissue or cotton balls. Peri-Wound Care: Skin Prep (DME) (Generic) 1 x Per Day/30 Days Discharge Instructions: Use skin prep as directed Prim Dressing: MediHoney Gel, tube 1.5 (oz) 1 x Per Day/30 Days ary Discharge Instructions: Apply to wound bed as instructed Prim Dressing: Sorbalgon AG Dressing 2x2 (in/in) (DME) (Generic) 1 x Per Day/30 Days ary Discharge Instructions: ***APPLY OVER THE MEDIHONEY AND DO NOT PACK THE WOUND.*** Secondary Dressing: Optifoam Non-Adhesive Dressing, 4x4 in (DME) (Generic) 1 x Per Day/30 Days Discharge Instructions: Apply ONE LAYER OF FOAM D Secondary Dressing: Woven Gauze Sponges 2x2 in (DME) (Generic) 1 x Per Day/30 Days Discharge Instructions: Apply over primary dressing as directed. Secured With: Child psychotherapist, Sterile 2x75 (in/in) (DME) (Generic) 1 x Per Day/30 Days Discharge Instructions: Secure with stretch gauze as directed. Secured With: 65M Medipore H Soft Cloth Surgical Tape, 4 x 10 (in/yd) (DME) (Generic) 1 x Per Day/30 Days KADDEN, OSTERHOUT ([id number]) [id number] Page 5 of 12 Discharge Instructions: Secure with tape as directed. Radiology MRI, bilateral feet with and without contrast - ****Sent to [facility]****MRI both feet with and without contrast related to non healing diabetic foot ulcers looking for  infection/osteomyelitis. CPT code - (ICD10 E11.621 - Type 2 diabetes mellitus with foot ulcer) Patient Medications llergies: amoxicillin, Lexapro A Notifications Medication Indication Start End applied only in clinic for1/03/2023 lidocaine debridements. DOSE topical 4 % gel - gel topical once daily Electronic Signature(s) Signed: 12/27/2022 2:54:58 PM By: Kalman Shan DO Previous Signature: 12/27/2022 11:34:30 AM Version By: Kalman Shan DO Entered By: Kalman Shan on 12/27/2022 14:47:41 Prescription 12/27/2022 -------------------------------------------------------------------------------- Alycia Rossetti E. Kalman Shan DO Patient Name: Provider: Jan 02, 1996 [id number] Date of Birth: NPI#: Jerilynn Mages [id number] Sex: DEA #: [phone] Phone #: License #: [place] Patient Address: [facility] [address] [place], [place] [address] [place], [place] [address] [phone] Allergies amoxicillin; Lexapro Provider's Orders MRI, bilateral feet with and without contrast - ICD10: E11.621 - ****Sent to [facility]****MRI both feet with and without contrast related to non healing diabetic foot ulcers looking for infection/osteomyelitis. CPT code Hand Signature: Date(s): Prescription 12/27/2022 Fellman, Seiji E. Kalman Shan DO Patient Name: Provider: [birth year] [id number] Date of Birth: NPI#: Jerilynn Mages [id number] Sex: DEA #: [phone] Phone #: License #: [place] Patient Address: [facility] [address] [place], [place] [address] [place], [place] [address] [phone] Allergies amoxicillin; Lexapro Medication Medication: Route: Strength: Form: lidocaine 4 % topical gel topical 4% gel Class: TOPICAL LOCAL ANESTHETICS SEON, GAERTNER ([id number]) [phone].pdf Page 6 of 12 Dose: Frequency / Time:  Indication: gel topical once daily applied only in clinic for debridements. Number of Refills: Number of Units: 0 Generic Substitution: Start Date: End Date: One Time Use: Substitution Permitted 03/01/7563 No Note to Pharmacy: Hand Signature: Date(s): Electronic Signature(s) Signed: 12/27/2022 2:54:58 PM By: Kalman Shan DO Previous Signature: 12/27/2022 11:34:30 AM Version By: Kalman Shan DO Entered By: Kalman Shan on 12/27/2022 14:47:41 -------------------------------------------------------------------------------- Problem List Details Patient Name: Date of Service: [name]. 12/27/2022 9:00 A M Medical Record Number: [id number] Patient Account Number: [id number] Date of Birth/Sex: Treating RN: Jun 16, 1996 (27 y.o. M) Primary Care Provider: Mina Marble Other Clinician: Referring Provider: Treating Provider/Extender: Tonita Cong in Treatment: 0 Active Problems ICD-10 Encounter Code Description Active Date MDM Diagnosis L97.518 Non-pressure chronic ulcer of other part of right foot with other specified 12/27/2022 No Yes severity L97.528 Non-pressure chronic ulcer of other part of left foot with other specified 12/27/2022 No Yes severity E10.40 Type 1 diabetes mellitus with diabetic neuropathy, unspecified 12/27/2022 No Yes E10.621 Type 1 diabetes mellitus with foot ulcer 12/27/2022 No Yes Inactive Problems Resolved Problems Electronic Signature(s) Signed: 12/27/2022 11:34:30 AM By: Kalman Shan DO Entered By: Kalman Shan on 12/27/2022 10:50:11 [name] ([id number]) [id number] Page 7 of 12 -------------------------------------------------------------------------------- Progress Note Details Patient Name: Date of Service: JAXSEN, BERNHART 12/27/2022 9:00 A M Medical Record Number: [id number] Patient Account Number: [id number] Date of Birth/Sex: Treating RN: Feb 17, 1996 (27 y.o.  M) Primary Care Provider: Mina Marble Other Clinician: Referring Provider: Treating Provider/Extender: Tonita Cong in Treatment: 0 Subjective Chief Complaint Information obtained from Patient 12/27/2022; bilateral diabetic foot wounds History of Present Illness (HPI) 12/27/2022 Mr. 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 He has been following with podiatry for this issue. He reports using Silvadene to the areas. Currently not using anything for offloading. He states that the right foot wound has been present for the past 1-1/2 years. He states that the left foot wound appeared about 3 months ago. He has not had recent imaging of the areas. Currently denies systemic signs of infection. Patient History Information obtained from Patient, Chart. Allergies amoxicillin, Lexapro Family History Unknown History. Social History Never smoker, Marital Status - Single, Alcohol Use - Never, Drug Use - No History, Caffeine Use - Rarely. Medical History Cardiovascular Patient has history of Hypertension Endocrine Patient has history of Type I Diabetes Denies history of Type II Diabetes Neurologic Patient has history of Neuropathy Patient is treated with Controlled Diet, Insulin. Blood sugar is tested. Hospitalization/Surgery History - R foot wound debridement and closure. 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 Medical A Surgical History Notes nd Cardiovascular heart murmur Review of Systems (ROS) Constitutional Symptoms (General Health) Denies complaints or symptoms of Fatigue, Fever, Chills, Marked Weight Change. Eyes Denies complaints or symptoms of Dry Eyes, Vision Changes, Glasses / Contacts. Ear/Nose/Mouth/Throat Denies complaints or symptoms of Chronic sinus  problems or rhinitis. Respiratory Denies complaints or  symptoms of Chronic or frequent coughs, Shortness of Breath. Gastrointestinal Denies complaints or symptoms of Frequent diarrhea, Nausea, Vomiting. Endocrine Denies complaints or symptoms of Heat/cold intolerance. Genitourinary Denies complaints or symptoms of Frequent urination. Integumentary (Skin) Complains or has symptoms of Wounds - bilateral feet. Musculoskeletal Denies complaints or symptoms of Muscle Pain, Muscle Weakness. Neurologic Denies complaints or symptoms of Numbness/parasthesias. Psychiatric Denies complaints or symptoms of Claustrophobia. [address] TRAVARIS, KOSH ([id number]) [id number] Page 8 of 12 Constitutional respirations regular, non-labored and within target range for patient.. Vitals Time Taken: 9:07 AM, Temperature: 97.8 F, Pulse: 88 bpm, Respiratory Rate: 18 breaths/min, Blood Pressure: 117/79 mmHg. Cardiovascular 2+ dorsalis pedis/posterior tibialis pulses. Psychiatric pleasant and cooperative. General Notes: Bilateral fifth met head wounds to the feet. On the left side there is significant undermining. There is nonviable tissue, granulation tissue and callus. T the right side there is increased depth but does not probe to bone. T the wound bed there is callus, granulation tissue and nonviable tissue. No signs o o of infection to the wound beds including increased warmth, erythema or purulent drainage. Integumentary (Hair, Skin) Wound #1 status is Open. Original cause of wound was Gradually Appeared. The date acquired was: 12/25/2019. The wound is located on the Right Metatarsal head fifth. The wound measures 0.3cm length x 0.3cm width x 0.2cm depth; 0.071cm^2 area and 0.014cm^3 volume. There is Fat Layer (Subcutaneous Tissue) exposed. There is no tunneling noted, however, there is undermining starting at 12:00 and ending at 12:00 with a maximum distance of 0.6cm. There  is a medium amount of serosanguineous drainage noted. The wound margin is distinct with the outline attached to the wound base. There is large (67-100%) red granulation within the wound bed. There is no necrotic tissue within the wound bed. The periwound skin appearance exhibited: Callus. The periwound skin appearance did not exhibit: Crepitus, Excoriation, Induration, Rash, Scarring, Dry/Scaly, Maceration, Atrophie Blanche, Cyanosis, Ecchymosis, Hemosiderin Staining, Mottled, Pallor, Rubor, Erythema. Wound #2 status is Open. Original cause of wound was Gradually Appeared. The date acquired was: 09/24/2022. The wound is located on the Left Metatarsal head fifth. The wound measures 0.2cm length x 0.3cm width x 0.3cm depth; 0.047cm^2 area and 0.014cm^3 volume. There is Fat Layer (Subcutaneous Tissue) exposed. There is no tunneling noted, however, there is undermining starting at 12:00 and ending at 12:00 with a maximum distance of 1.4cm. There is a medium amount of serosanguineous drainage noted. The wound margin is distinct with the outline attached to the wound base. There is large (67-100%) red granulation within the wound bed. There is no necrotic tissue within the wound bed. The periwound skin appearance exhibited: Callus. The periwound skin appearance did not exhibit: Crepitus, Excoriation, Induration, Rash, Scarring, Dry/Scaly, Maceration, Atrophie Blanche, Cyanosis, Ecchymosis, Hemosiderin Staining, Mottled, Pallor, Rubor, Erythema. Wound #2 status is Open. Original cause of wound was Gradually Appeared. The date acquired was: 09/24/2022. The wound is located on the Left Metatarsal head fifth. The wound measures 0.2cm length x 0.3cm width x 0.3cm depth; 0.047cm^2 area and 0.014cm^3 volume. There is Fat Layer (Subcutaneous Tissue) exposed. There is no tunneling noted, however, there is undermining starting at 12:00 and ending at 12:00 with a maximum distance of 1.4cm. There is a medium amount of  serosanguineous drainage noted. The wound margin is distinct with the outline attached to the wound base. There is large (67-100%) red granulation within the wound bed. There is no necrotic tissue within the wound bed. The periwound skin appearance exhibited: Callus. The periwound  skin appearance did not exhibit: Crepitus, Excoriation, Induration, Rash, Scarring, Dry/Scaly, Maceration, Atrophie Blanche, Cyanosis, Ecchymosis, Hemosiderin Staining, Mottled, Pallor, Rubor, Erythema. 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 Patient has type 1 diabetes with last hemoglobin A1c of 8.4. Previous to this it was 5.3 and his medications are currently being adjusted. We discussed the importance of glycemic control in wound healing. He appears invested in getting his glucose levels under control. Due to the chronicity of the wounds I recommended imaging of his feet. He had an x-ray on 10/12 to the right foot that did not show evidence of osseous erosion. He would benefit from an MRI. I debrided nonviable tissue. I recommended Medihoney and silver alginate to the wound beds daily. We also discussed the importance of aggressive offloading for wound healing. If no signs of osteo on MRI he would benefit from a total contact cast and we discussed this today. He would like to proceed with this pending imaging results. For now I recommended surgical shoes with peg assist inserts to help with offloading. Follow-up in 1 week. Procedures Wound #1 Pre-procedure diagnosis of Wound #1 is a Diabetic Wound/Ulcer of the Lower Extremity located on the Right  Metatarsal head fifth .Severity of Tissue Pre Debridement is: Fat layer exposed. There was a Excisional Skin/Subcutaneous Tissue Debridement with a total area of 1 sq cm performed by Kalman Shan, DO. With the following instrument(s): Curette to remove Viable and Non-Viable tissue/material. Material removed includes Callus, Subcutaneous Tissue, Slough, Skin: Dermis, and Skin: Epidermis after achieving pain control using Lidocaine 4% Topical Solution. A time out was conducted at 09:50, prior to the start of the procedure. A Minimum amount of bleeding was controlled with Pressure. The procedure was tolerated well with a pain level of 0 throughout and a pain level of 0 following the procedure. Post Debridement Measurements: 1cm length x 1cm width x 0.2cm depth; 0.157cm^3 volume. Character of Wound/Ulcer Post Debridement is improved. Severity of Tissue Post Debridement is: Fat layer exposed. Post procedure Diagnosis Wound #1: Same as Pre-Procedure Wound #2 Pre-procedure diagnosis of Wound #2 is a Diabetic Wound/Ulcer of the Lower Extremity located on the Left Metatarsal head fifth .Severity of Tissue Pre Debridement is: Fat layer exposed. There was a Excisional Skin/Subcutaneous Tissue Debridement with a total area of 1 sq cm performed by Kalman Shan, DO. With the following instrument(s): Curette to remove Viable and Non-Viable tissue/material. Material removed includes Callus, Subcutaneous Tissue, Slough, Skin: Dermis, and Skin: Epidermis after achieving pain control using Lidocaine 4% Topical Solution. A time out was conducted at 09:50, prior to the start of the procedure. A Minimum amount of bleeding was controlled with Pressure. The procedure was tolerated well with a pain level of 0 throughout and a pain level of 0 following the procedure. Post Debridement Measurements: 0.2cm length x 0.3cm width x 0.3cm depth; 0.014cm^3 volume. HRIDAAN, BOUSE ([id number])  [id number] Page 9 of 12 Character of Wound/Ulcer Post Debridement is improved. Severity of Tissue Post Debridement is: Fat layer exposed. Post procedure Diagnosis Wound #2: Same as Pre-Procedure Plan Follow-up Appointments: Return Appointment in 1 week. - Thursday with Dr. Heber [place] on 01/03/2023 0845 Return Appointment in 2 weeks. -  Thursday with Dr. Heber [place] on 01/10/2023 1030 Other: - Someone will Anesthetic: (In clinic) Topical Lidocaine 4% applied to wound bed Bathing/ Shower/ Hygiene: May shower with protection but do not get wound dressing(s) wet. Protect dressing(s) with water repellant cover (for example, large plastic bag) or a cast cover and may then take shower. Off-Loading: Open toe surgical shoe to: - both surgical shoes with PEG ASSIST to both shoes. Wear while walking and standing. DO not walk barefoot or just in socks. Additional Orders / Instructions: Follow Nutritious Diet - CLOSELY MONITOR BLOOD SUGAR. Radiology ordered were: MRI, bilateral feet with and without contrast - ****Sent to [facility]****MRI both feet with and without contrast related to non healing diabetic foot ulcers looking for infection/osteomyelitis. CPT code The following medication(s) was prescribed: lidocaine topical 4 % gel gel topical once daily for applied only in clinic for debridements. was prescribed at facility WOUND #1: - Metatarsal head fifth Wound Laterality: Right Cleanser: Wound Cleanser (DME) (Generic) 1 x Per Day/30 Days Discharge Instructions: Cleanse the wound with wound cleanser prior to applying a clean dressing using gauze sponges, not tissue or cotton balls. Peri-Wound Care: Skin Prep (DME) (Generic) 1 x Per Day/30 Days Discharge Instructions: Use skin prep as directed Prim Dressing: MediHoney Gel, tube 1.5 (oz) 1 x Per Day/30 Days ary Discharge Instructions: Apply to wound bed as instructed Prim Dressing: Sorbalgon AG Dressing 2x2 (in/in) (DME)  (Generic) 1 x Per Day/30 Days ary Discharge Instructions: ***APPLY OVER THE MEDIHONEY AND DO NOT PACK THE WOUND.*** Secondary Dressing: Optifoam Non-Adhesive Dressing, 4x4 in (DME) (Generic) 1 x Per Day/30 Days Discharge Instructions: Apply ONE LAYER OF FOAM D Secondary Dressing: Woven Gauze Sponges 2x2 in (DME) (Generic) 1 x Per Day/30 Days Discharge Instructions: Apply over primary dressing as directed. Secured With: Child psychotherapist, Sterile 2x75 (in/in) (DME) (Generic) 1 x Per Day/30 Days Discharge Instructions: Secure with stretch gauze as directed. Secured With: 86M Medipore H Soft Cloth Surgical T ape, 4 x 10 (in/yd) (DME) (Generic) 1 x Per Day/30 Days Discharge Instructions: Secure with tape as directed. WOUND #2: - Metatarsal head fifth Wound Laterality: Left Cleanser: Wound Cleanser (DME) (Generic) 1 x Per Day/30 Days Discharge Instructions: Cleanse the wound with wound cleanser prior to applying a clean dressing using gauze sponges, not tissue or cotton balls. Peri-Wound Care: Skin Prep (DME) (Generic) 1 x Per Day/30 Days Discharge Instructions: Use skin prep as directed Prim Dressing: MediHoney Gel, tube 1.5 (oz) 1 x Per Day/30 Days ary Discharge Instructions: Apply to wound bed as instructed Prim Dressing: Sorbalgon AG Dressing 2x2 (in/in) (DME) (Generic) 1 x Per Day/30 Days ary Discharge Instructions: ***APPLY OVER THE MEDIHONEY AND DO NOT PACK THE WOUND.*** Secondary Dressing: Optifoam Non-Adhesive Dressing, 4x4 in (DME) (Generic) 1 x Per Day/30 Days Discharge Instructions: Apply ONE LAYER OF FOAM D Secondary Dressing: Woven Gauze Sponges 2x2 in (DME) (Generic) 1 x Per Day/30 Days Discharge Instructions: Apply over primary dressing as directed. Secured With: Child psychotherapist, Sterile 2x75 (in/in) (DME) (Generic) 1 x Per Day/30 Days Discharge Instructions: Secure with stretch gauze as directed. Secured With: 86M Medipore H Soft Cloth Surgical T  ape, 4 x 10 (in/yd) (DME) (Generic) 1 x Per Day/30 Days Discharge Instructions: Secure with tape as directed. 1. In office sharp debridement 2. Medihoney and silver alginate 3. Aggressive offloadingoopeg assist and surgical shoes bilaterally 4. MRI to the left and right foot 5. Follow-up in 1 week Electronic Signature(s) Signed: 12/27/2022 2:54:58 PM By:  Kalman Shan DO Entered By: Kalman Shan on 12/27/2022 14:52:24 HxROS Details -------------------------------------------------------------------------------- [name] ([id number]) [id number] Page 10 of 12 Patient Name: Date of Service: BEAUREGARD, JARRELLS 12/27/2022 9:00 A M Medical Record Number: [id number] Patient Account Number: [id number] Date of Birth/Sex: Treating RN: 1996-05-29 (27 y.o. Erie Noe Primary Care Provider: Mina Marble Other Clinician: Referring Provider: Treating Provider/Extender: Tonita Cong in Treatment: 0 Information Obtained From Patient Chart Constitutional Symptoms (General Health) Complaints and Symptoms: Negative for: Fatigue; Fever; Chills; Marked Weight Change Eyes Complaints and Symptoms: Negative for: Dry Eyes; Vision Changes; Glasses / Contacts Ear/Nose/Mouth/Throat Complaints and Symptoms: Negative for: Chronic sinus problems or rhinitis Respiratory Complaints and Symptoms: Negative for: Chronic or frequent coughs; Shortness of Breath Gastrointestinal Complaints and Symptoms: Negative for: Frequent diarrhea; Nausea; Vomiting Endocrine Complaints and Symptoms: Negative for: Heat/cold intolerance Medical History: Positive for: Type I Diabetes Negative for: Type II Diabetes Time with diabetes: 1 year ago Treated with: Insulin, Diet Blood sugar tested every day: Yes Tested : TID Genitourinary Complaints and Symptoms: Negative for: Frequent urination Integumentary (Skin) Complaints and  Symptoms: Positive for: Wounds - bilateral feet Musculoskeletal Complaints and Symptoms: Negative for: Muscle Pain; Muscle Weakness Neurologic Complaints and Symptoms: Negative for: Numbness/parasthesias Medical History: Positive for: Neuropathy Psychiatric Complaints and Symptoms: Negative for: Claustrophobia Hematologic/Lymphatic RIDGE, LAFOND ([id number]) [phone].pdf Page 11 of 12 Cardiovascular Medical History: Positive for: Hypertension Past Medical History Notes: heart murmur Immunological Oncologic Immunizations Pneumococcal Vaccine: Received Pneumococcal Vaccination: No Implantable Devices None Hospitalization / Surgery History Type of Hospitalization/Surgery 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 Family and Social History Unknown History: Yes; Never smoker; Marital Status - Single; Alcohol Use: Never; Drug Use: No History; Caffeine Use: Rarely; Financial Concerns: No; Food, Clothing or Shelter Needs: No; Support System Lacking: No; Transportation Concerns: No Electronic Signature(s) Signed: 12/27/2022 11:34:30 AM By: Kalman Shan DO Signed: 12/27/2022 6:11:27 PM By: Deon Pilling RN, BSN Signed: 12/28/2022 12:14:03 PM By: Rhae Hammock RN Entered By: Deon Pilling on 12/27/2022 09:18:12 -------------------------------------------------------------------------------- SuperBill Details Patient Name: Date of Service: CANYON, LOHR 12/27/2022 Medical Record Number: [id number] Patient Account Number: [id number] Date of Birth/Sex: Treating RN: 1996-12-15 (27 y.o. Hessie Diener Primary Care Provider: Mina Marble Other Clinician: Referring Provider: Treating Provider/Extender: Tonita Cong in Treatment: 0 Diagnosis Coding ICD-10 Codes Code Description [id number] 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ity L97.528 Non-pressure  chronic ulcer of other part of left foot with other specified severity E10.40 Type 1 diabetes mellitus with diabetic neuropathy, unspecified E10.621 Type 1 diabetes mellitus with foot ulcer Facility Procedures : CPT4 Code: 11941740 Description: 99213 - WOUND CARE VISIT-LEV 3 EST PT Modifier: Quantity: 1 : CPT4 Code: 81448185 Description: 11042 - DEB SUBQ TISSUE 20 SQ CM/< ICD-10 Diagnosis Description L97.518 Non-pressure chronic ulcer of other part of right foot with other specified seve L97.528 Non-pressure chronic ulcer of other part of left foot with other specified sever Modifier: rity ity Quantity: 1 Physician Procedures : CPT4 Code Description Modifier PRIMUS, GRITTON ([id number]) [id number] - WC PHYS LEVEL 4 - NEW PT 1 ICD-10 Diagnosis Description E10.621 Type 1 diabetes mellitus with foot ulcer L97.518 Non-pressure chronic ulcer  of other part of right foot with other specified severity L97.528 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ty E10.40 Type 1 diabetes mellitus with diabetic neuropathy, unspecified Quantity: 27.pdf Page 12 of 12 : [id number]  11042 - WC PHYS SUBQ TISS 20 SQ CM 1 ICD-10 Diagnosis Description L97.518 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ity L97.528 Non-pressure chronic ulcer of other part of left foot with other specified  severity Quantity: Electronic Signature(s) Signed: 12/27/2022 2:54:58 PM By: Kalman Shan DO Previous Signature: 12/27/2022 11:34:30 AM Version By: Kalman Shan DO Entered By: Kalman Shan on 12/27/2022 14:54:08

## 2022-12-28 NOTE — Progress Notes (Signed)
Lucas, Brown ([id number]) [phone] Page 1 of 4 Visit Report for 12/27/2022 Abuse Risk Screen Details Patient Name: Date of Service: [name], [name] 12/27/2022 9:00 A M Medical Record Number: [id number] Patient Account Number: [id number] Date of Birth/Sex: Treating RN: Oct 22, 1996 (27 y.o. [name] Primary Care [name]: [name] Other Clinician: Referring [name]: Treating [name] in Treatment: 0 Abuse Risk Screen Items Answer ABUSE RISK SCREEN: Has anyone close to you tried to hurt or harm you recentlyo No Do you feel uncomfortable with anyone in your familyo No Has anyone forced you do things that you didnt want to doo No Electronic Signature(s) Signed: 12/27/2022 6:11:27 PM By: Lucas Pilling RN, BSN Entered By: [name] on 12/27/2022 09:16:26 -------------------------------------------------------------------------------- Activities of Daily Living Details Patient Name: Date of Service: [name], [name] 12/27/2022 9:00 A M Medical Record Number: [id number] Patient Account Number: [id number] Date of Birth/Sex: Treating RN: 11-17-1996 (27 y.o. [name] Primary Care Maryse Brierley: [name] Other Clinician: Referring [name]: Treating [name]/Extender: [name] in Treatment: 0 Activities of Daily Living Items Answer Activities of Daily Living (Please select one for each item) Drive Automobile Completely Able T Medications ake Completely Able Use T elephone Completely Able Care for Appearance Completely Able Use T oilet Completely Able Bath / Shower Completely Able Dress Self Completely Able Feed Self Completely Able Walk Completely Able Get In / Out Bed Completely Able Housework Completely Able Prepare Meals Completely Able Handle Money Completely Able Shop for Self Completely Able Electronic  Signature(s) Signed: 12/27/2022 6:11:27 PM By: Lucas Pilling RN, BSN Entered By: [name] on 12/27/2022 09:16:42 [name] ([id number]) [id number] Page 2 of 4 -------------------------------------------------------------------------------- Education Screening Details Patient Name: Date of Service: [name], [name] 12/27/2022 9:00 A M Medical Record Number: [id number] Patient Account Number: [id number] Date of Birth/Sex: Treating RN: Jun 28, 1996 (27 y.o. [name] Primary Care [name]: [name] Other Clinician: Referring [name]: Treating [name] in Treatment: 0 Primary Learner Assessed: Patient Learning Preferences/Education Level/Primary Language Learning Preference: Explanation, Demonstration, Printed Material Highest Education Level: College or Above Preferred Language: English Cognitive Barrier Language Barrier: No Translator Needed: No Memory Deficit: No Emotional Barrier: No Cultural/Religious Beliefs Affecting Medical Care: No Physical Barrier Impaired Vision: Yes Glasses Impaired Hearing: No Decreased Hand dexterity: No Knowledge/Comprehension Knowledge Level: High Comprehension Level: High Ability to understand written instructions: High Ability to understand verbal instructions: High Motivation Anxiety Level: Calm Cooperation: Cooperative Education Importance: Acknowledges Need Interest in Health Problems: Asks Questions Perception: Coherent Willingness to Engage in Self-Management High Activities: Readiness to Engage in Self-Management High Activities: Electronic Signature(s) Signed: 12/27/2022 6:11:27 PM By: Lucas Pilling RN, BSN Entered By: [name] on 12/27/2022 09:17:02 -------------------------------------------------------------------------------- Fall Risk Assessment Details Patient Name: Date of Service: [name]  12/27/2022 9:00 A M Medical Record Number: [id number] Patient Account Number: [id number] Date of Birth/Sex: Treating RN: 1996/07/27 (27 y.o. [name] Primary Care Blaine Hari: [name] Other Clinician: Referring Lynda Capistran: Treating [name] in Treatment: 0 Fall Risk Assessment Items Have you had 2 or more falls in the last 12 monthso 0 No [name] ([id number]) [id number] Page 3 of 4 Have you had any fall that resulted in injury in the last 12 monthso 0 No FALLS RISK SCREEN History of falling - immediate or within 3 months 0 No Secondary diagnosis (Do you have 2 or more medical diagnoseso) 0 No Ambulatory  aid None/bed rest/wheelchair/nurse 0 Yes Crutches/cane/walker 0 No Furniture 0 No Intravenous therapy Access/Saline/Heparin Lock 0 No Gait/Transferring Normal/ bed rest/ wheelchair 0 Yes Weak (short steps with or without shuffle, stooped but able to lift head while walking, may seek 0 No support from furniture) Impaired (short steps with shuffle, may have difficulty arising from chair, head down, impaired 0 No balance) Mental Status Oriented to own ability 0 Yes Electronic Signature(s) Signed: 12/27/2022 6:11:27 PM By: Lucas Pilling RN, BSN Entered By: [name] on 12/27/2022 09:17:21 -------------------------------------------------------------------------------- Foot Assessment Details Patient Name: Date of Service: [name] 12/27/2022 9:00 A M Medical Record Number: [id number] Patient Account Number: [id number] Date of Birth/Sex: Treating RN: 09/23/1996 (27 y.o. [name] Primary Care Shenna Brissette: [name] Other Clinician: Referring [name]: Treating [name] in Treatment: 0 Foot Assessment Items Site Locations + = Sensation present, - = Sensation absent, C = Callus, U = Ulcer R =  Redness, W = Warmth, M = Maceration, PU = Pre-ulcerative lesion F = Fissure, S = Swelling, D = Dryness Assessment Right: Left: Other Deformity: No No Prior Foot Ulcer: No No Prior Amputation: No No Charcot Joint: No No Ambulatory Status: Ambulatory Without Help GaitPRANAV, [name] ([id number]) [phone] [id number] Page 4 of 4 Electronic Signature(s) Signed: 12/27/2022 6:11:27 PM By: Lucas Pilling RN, BSN Entered By: [name] on 12/27/2022 09:19:50 -------------------------------------------------------------------------------- Nutrition Risk Screening Details Patient Name: Date of Service: [name], [name] 12/27/2022 9:00 A M Medical Record Number: [id number] Patient Account Number: [id number] Date of Birth/Sex: Treating RN: [birth year] (27 y.o. [name] Primary Care Sheryl Towell: [name] Other Clinician: Referring Reshonda Koerber: Treating Banita Lehn/Extender: [name] in Treatment: 0 Height (in): Weight (lbs): Body Mass Index (BMI): Nutrition Risk Screening Items Score Screening NUTRITION RISK SCREEN: I have an illness or condition that made me change the kind and/or amount of food I eat 2 Yes I eat fewer than two meals per day 0 No I eat few fruits and vegetables, or milk products 0 No I have three or more drinks of beer, liquor or wine almost every day 0 No I have tooth or mouth problems that make it hard for me to eat 0 No I don't always have enough money to buy the food I need 0 No I eat alone most of the time 0 No I take three or more different prescribed or over-the-counter drugs a day 1 Yes Without wanting to, I have lost or gained 10 pounds in the last six months 0 No I am not always physically able to shop, cook and/or feed myself 0 No Nutrition Protocols Good Risk Protocol Provide education on elevated blood Moderate Risk Protocol 0 sugars and impact on wound healing, as  applicable High Risk Proctocol Risk Level: Moderate Risk Score: 3 Electronic Signature(s) Signed: 12/27/2022 6:11:27 PM By: Lucas Pilling RN, BSN Entered By: [name] on 12/27/2022 09:17:43

## 2022-12-31 ENCOUNTER — Other Ambulatory Visit: Payer: Self-pay | Admitting: Internal Medicine

## 2022-12-31 DIAGNOSIS — E11621 Type 2 diabetes mellitus with foot ulcer: Secondary | ICD-10-CM

## 2023-01-01 NOTE — Progress Notes (Signed)
[name], [name] ([id number]) [id number] Page 1 of 12 Visit Report for 12/27/2022 Allergy List Details Patient Name: Date of Service: [name], [name] 12/27/2022 9:00 A M Medical Record Number: [id number] Patient Account Number: [id number] Date of Birth/Sex: Treating RN: June 10, 1996 (27 y.o. Burnadette Pop, Lauren Primary Care Cherolyn Behrle: Mina Marble Other Clinician: Referring Dredyn Gubbels: Treating Shaden Higley/Extender: Tonita Cong in Treatment: 0 Allergies Active Allergies amoxicillin Lexapro Allergy Notes Electronic Signature(s) Signed: 12/28/2022 12:14:03 PM By: Rhae Hammock RN Entered By: Rhae Hammock on 12/26/2022 16:59:25 -------------------------------------------------------------------------------- Arrival Information Details Patient Name: Date of Service: [name] 12/27/2022 9:00 A M Medical Record Number: [id number] Patient Account Number: [id number] Date of Birth/Sex: Treating RN: Sep 18, 1996 (27 y.o. Burnadette Pop, Lauren Primary Care Dion Sibal: Mina Marble Other Clinician: Donavan Burnet Referring Vineta Carone: Treating Aleen Marston/Extender: Tonita Cong in Treatment: 0 Visit Information Patient Arrived: Ambulatory Arrival Time: 09:04 Accompanied By: mother,driver Transfer Assistance: None Patient Identification Verified: Yes Secondary Verification Process Completed: Yes Patient Requires Transmission-Based Precautions: Yes Transmission-Based Precautions: Contact MRSA Electronic Signature(s) Signed: 12/28/2022 12:14:03 PM By: Rhae Hammock RN Entered By: Rhae Hammock on 12/27/2022 09:07:14 Clinic Level of Care Assessment Details -------------------------------------------------------------------------------- [name] ([id number]) [id number] Page 2 of 12 Patient Name: Date of Service: [name], [name] 12/27/2022 9:00 A  M Medical Record Number: [id number] Patient Account Number: [id number] Date of Birth/Sex: Treating RN: February 23, 1996 (27 y.o. Hessie Diener Primary Care Tylie Golonka: Mina Marble Other Clinician: Referring Jari Dipasquale: Treating Tawn Fitzner/Extender: Tonita Cong in Treatment: 0 Clinic Level of Care Assessment Items TOOL 1 Quantity Score X- 1 0 Use when EandM and Procedure is performed on INITIAL visit ASSESSMENTS - Nursing Assessment / Reassessment X- 1 20 General Physical Exam (combine w/ comprehensive assessment (listed just below) when performed on new pt. evals) X- 1 25 Comprehensive Assessment (HX, ROS, Risk Assessments, Wounds Hx, etc.) ASSESSMENTS - Wound and Skin Assessment / Reassessment X- 1 10 Dermatologic / Skin Assessment (not related to wound area) ASSESSMENTS - Ostomy and/or Continence Assessment and Care '[]'$  - 0 Incontinence Assessment and Management '[]'$  - 0 Ostomy Care Assessment and Management (repouching, etc.) PROCESS - Coordination of Care '[]'$  - 0 Simple Patient / Family Education for ongoing care X- 1 20 Complex (extensive) Patient / Family Education for ongoing care X- 1 10 Staff obtains Programmer, systems, Records, T Results / Process Orders est '[]'$  - 0 Staff telephones HHA, Nursing Homes / Clarify orders / etc '[]'$  - 0 Routine Transfer to another Facility (non-emergent condition) '[]'$  - 0 Routine Hospital Admission (non-emergent condition) X- 1 15 New Admissions / Biomedical engineer / Ordering NPWT Apligraf, etc. , '[]'$  - 0 Emergency Hospital Admission (emergent condition) PROCESS - Special Needs '[]'$  - 0 Pediatric / Minor Patient Management '[]'$  - 0 Isolation Patient Management '[]'$  - 0 Hearing / Language / Visual special needs '[]'$  - 0 Assessment of Community assistance (transportation, D/C planning, etc.) '[]'$  - 0 Additional assistance / Altered mentation '[]'$  - 0 Support Surface(s) Assessment (bed, cushion, seat,  etc.) INTERVENTIONS - Miscellaneous '[]'$  - 0 External ear exam '[]'$  - 0 Patient Transfer (multiple staff / Civil Service fast streamer / Similar devices) '[]'$  - 0 Simple Staple / Suture removal (25 or less) '[]'$  - 0 Complex Staple / Suture removal (26 or more) '[]'$  - 0 Hypo/Hyperglycemic Management (do not check if billed separately) X- 1 15 Ankle / Brachial Index (ABI) - do not check if billed separately Has the patient been seen at the hospital within the last  three years: Yes Total Score: 115 Level Of Care: New/Established - Level 3 Electronic Signature(s) Signed: 12/27/2022 6:11:27 PM By: Deon Pilling RN, BSN Entered By: Deon Pilling on 12/27/2022 10:01:01 [name] ([id number]) [id number] Page 3 of 12 -------------------------------------------------------------------------------- Encounter Discharge Information Details Patient Name: Date of Service: [name], [name] 12/27/2022 9:00 A M Medical Record Number: [id number] Patient Account Number: [id number] Date of Birth/Sex: Treating RN: 06-07-1996 (27 y.o. Hessie Diener Primary Care Mylynn Dinh: Mina Marble Other Clinician: Referring Eniola Cerullo: Treating Traniece Boffa/Extender: Tonita Cong in Treatment: 0 Encounter Discharge Information Items Post Procedure Vitals Discharge Condition: Stable Temperature (F): 97.8 Ambulatory Status: Ambulatory Pulse (bpm): 88 Discharge Destination: Home Respiratory Rate (breaths/min): 18 Transportation: Private Auto Blood Pressure (mmHg): 117/79 Accompanied By: self Schedule Follow-up Appointment: Yes Clinical Summary of Care: Electronic Signature(s) Signed: 12/27/2022 6:11:27 PM By: Deon Pilling RN, BSN Entered By: Deon Pilling on 12/27/2022 10:01:47 -------------------------------------------------------------------------------- Lower Extremity Assessment Details Patient Name: Date of Service: [name], [name] 12/27/2022 9:00 A M Medical  Record Number: [id number] Patient Account Number: [id number] Date of Birth/Sex: Treating RN: [birth year] (27 y.o. Hessie Diener Primary Care Kamilla Hands: Mina Marble Other Clinician: Referring Treysen Sudbeck: Treating Donique Hammonds/Extender: Tonita Cong in Treatment: 0 Edema Assessment Assessed: Shirlyn Goltz: Yes] Patrice Paradise: Yes] Edema: [Left: No] [Right: No] Calf Left: Right: Point of Measurement: 34 cm From Medial Instep 36 cm 36 cm Ankle Left: Right: Point of Measurement: 9 cm From Medial Instep 22 cm 21 cm Knee To Floor Left: Right: From Medial Instep 47 cm 47 cm Vascular Assessment Pulses: Dorsalis Pedis Palpable: [Left:Yes] [Right:Yes] Posterior Tibial Palpable: [Left:Yes] [Right:Yes] Blood Pressure: Brachial: [Left:117] Ankle: [Left:Dorsalis Pedis: 614] [Right:Dorsalis Pedis: 136] Ankle Brachial Index: [Left:1.03] [Right:1.16 [id number] Page 4 of 12] Electronic Signature(s) Signed: 12/27/2022 6:11:27 PM By: Deon Pilling RN, BSN Entered By: Deon Pilling on 12/27/2022 09:28:28 -------------------------------------------------------------------------------- Multi Wound Chart Details Patient Name: Date of Service: [name] 12/27/2022 9:00 A M Medical Record Number: [id number] Patient Account Number: [id number] Date of Birth/Sex: Treating RN: 01-Dec-1996 (27 y.o. M) Primary Care Messiah Rovira: Mina Marble Other Clinician: Referring Christyan Reger: Treating Catalia Massett/Extender: Tonita Cong in Treatment: 0 Vital Signs Height(in): Pulse(bpm): 35 Weight(lbs): Blood Pressure(mmHg): 117/79 Body Mass Index(BMI): Temperature(F): 97.8 Respiratory Rate(breaths/min): 18 [1:Photos:] Right Metatarsal head fifth Left Metatarsal head fifth Left Metatarsal head fifth Wound Location: Gradually Appeared Gradually Appeared Gradually Appeared Wounding Event: Diabetic Wound/Ulcer of the Lower Diabetic  Wound/Ulcer of the Lower Diabetic Wound/Ulcer of the Lower Primary Etiology: Extremity Extremity Extremity Hypertension, Type I Diabetes, Hypertension, Type I Diabetes, Hypertension, Type I Diabetes, Comorbid History: Neuropathy Neuropathy Neuropathy 12/25/2019 09/24/2022 09/24/2022 Date Acquired: 0 0 0 Weeks of Treatment: Open Open Open Wound Status: No No No Wound Recurrence: Yes Yes Yes Pending A mputation on Presentation: 0.3x0.3x0.2 0.2x0.3x0.3 0.2x0.3x0.3 Measurements L x W x D (cm) 0.071 0.047 0.047 A (cm) : rea 0.014 0.014 0.014 Volume (cm) : '12 12 12 '$ Starting Position 1 (o'clock): '12 12 12 '$ Ending Position 1 (o'clock): 0.6 1.4 1.4 Maximum Distance 1 (cm): Yes Yes Yes Undermining: Grade 2 Grade 2 Grade 2 Classification: Medium Medium Medium Exudate A mount: Serosanguineous Serosanguineous Serosanguineous Exudate Type: red, brown red, brown red, brown Exudate Color: Distinct, outline attached Distinct, outline attached Distinct, outline attached Wound Margin: Large (67-100%) Large (67-100%) Large (67-100%) Granulation A mount: Red Red Red Granulation Quality: None Present (0%) None Present (0%) None Present (0%) Necrotic A mount: Fat Layer (Subcutaneous Tissue): Yes Fat Layer (Subcutaneous Tissue): Yes  Fat Layer (Subcutaneous Tissue): Yes Exposed Structures: Fascia: No Fascia: No Fascia: No Tendon: No Tendon: No Tendon: No Muscle: No Muscle: No Muscle: No Joint: No Joint: No Joint: No Bone: No Bone: No Bone: No Small (1-33%) None None Epithelialization: Debridement - Excisional Debridement - Excisional Debridement - Excisional Debridement: Pre-procedure Verification/Time Out 09:50 09:50 09:50 Taken: Lidocaine 4% Topical Solution Lidocaine 4% Topical Solution Lidocaine 4% Topical Solution Pain ControlMAZE, CORNIEL ([id number]) [id number] Page 5 of 12 Callus, Subcutaneous, Slough Callus, Subcutaneous, Slough  Callus, Subcutaneous, Slough Tissue Debrided: Skin/Subcutaneous Tissue Skin/Subcutaneous Tissue Skin/Subcutaneous Tissue Level: '1 1 1 '$ Debridement A (sq cm): rea Curette Curette Curette Instrument: Minimum Minimum Minimum Bleeding: Pressure Pressure Pressure Hemostasis A chieved: 0 0 0 Procedural Pain: 0 0 0 Post Procedural Pain: Procedure was tolerated well Procedure was tolerated well Procedure was tolerated well Debridement Treatment Response: 1x1x0.2 0.2x0.3x0.3 0.2x0.3x0.3 Post Debridement Measurements L x W x D (cm) 0.157 0.014 0.014 Post Debridement Volume: (cm) Callus: Yes Callus: Yes Callus: Yes Periwound Skin Texture: Excoriation: No Excoriation: No Excoriation: No Induration: No Induration: No Induration: No Crepitus: No Crepitus: No Crepitus: No Rash: No Rash: No Rash: No Scarring: No Scarring: No Scarring: No Maceration: No Maceration: No Maceration: No Periwound Skin Moisture: Dry/Scaly: No Dry/Scaly: No Dry/Scaly: No Atrophie Blanche: No Atrophie Blanche: No Atrophie Blanche: No Periwound Skin Color: Cyanosis: No Cyanosis: No Cyanosis: No Ecchymosis: No Ecchymosis: No Ecchymosis: No Erythema: No Erythema: No Erythema: No Hemosiderin Staining: No Hemosiderin Staining: No Hemosiderin Staining: No Mottled: No Mottled: No Mottled: No Pallor: No Pallor: No Pallor: No Rubor: No Rubor: No Rubor: No Debridement Debridement Debridement Procedures Performed: Treatment Notes Wound #1 (Metatarsal head fifth) Wound Laterality: Right Cleanser Wound Cleanser Discharge Instruction: Cleanse the wound with wound cleanser prior to applying a clean dressing using gauze sponges, not tissue or cotton balls. Peri-Wound Care Skin Prep Discharge Instruction: Use skin prep as directed Topical Primary Dressing MediHoney Gel, tube 1.5 (oz) Discharge Instruction: Apply to wound bed as instructed [place] 2x2 (in/in) Discharge  Instruction: ***APPLY OVER THE [place] AND DO NOT PACK THE WOUND.*** Secondary Dressing Optifoam Non-Adhesive Dressing, 4x4 in Discharge Instruction: Apply ONE LAYER OF FOAM D Woven Gauze Sponges 2x2 in Discharge Instruction: Apply over primary dressing as directed. Secured With Conforming Stretch Gauze Bandage, Sterile 2x75 (in/in) Discharge Instruction: Secure with stretch gauze as directed. 50M Medipore H Soft Cloth Surgical T ape, 4 x 10 (in/yd) Discharge Instruction: Secure with tape as directed. Compression Wrap Compression Stockings Add-Ons Wound #2 (Metatarsal head fifth) Wound Laterality: Left Cleanser Wound Cleanser Discharge Instruction: Cleanse the wound with wound cleanser prior to applying a clean dressing using gauze sponges, not tissue or cotton balls. Peri-Wound Care Skin Prep Discharge Instruction: Use skin prep as directed ADRON, GEISEL ([id number]) [id number] Page 6 of 12 Topical Primary Dressing MediHoney Gel, tube 1.5 (oz) Discharge Instruction: Apply to wound bed as instructed [place] 2x2 (in/in) Discharge Instruction: ***APPLY OVER THE [place] AND DO NOT PACK THE WOUND.*** Secondary Dressing Optifoam Non-Adhesive Dressing, 4x4 in Discharge Instruction: Apply ONE LAYER OF FOAM D Woven Gauze Sponges 2x2 in Discharge Instruction: Apply over primary dressing as directed. Secured With Conforming Stretch Gauze Bandage, Sterile 2x75 (in/in) Discharge Instruction: Secure with stretch gauze as directed. 50M Medipore H Soft Cloth Surgical T ape, 4 x 10 (in/yd) Discharge Instruction: Secure with tape as directed. Compression Wrap Compression Stockings Add-Ons Electronic Signature(s) Signed: 12/27/2022 11:34:30 AM By: Kalman Shan DO Entered By:  Kalman Shan on 12/27/2022 10:50:18 -------------------------------------------------------------------------------- Multi-Disciplinary Care Plan Details Patient  Name: Date of Service: [name], [name] 12/27/2022 9:00 A M Medical Record Number: [id number] Patient Account Number: [id number] Date of Birth/Sex: Treating RN: 1996-07-30 (27 y.o. Hessie Diener Primary Care Tag Wurtz: Mina Marble Other Clinician: Referring Kitt Minardi: Treating Azlynn Mitnick/Extender: Tonita Cong in Treatment: 0 Active Inactive Orientation to the Wound Care Program Nursing Diagnoses: Knowledge deficit related to the wound healing center program Goals: Patient/caregiver will verbalize understanding of the [place] Program Date Initiated: 12/27/2022 Target Resolution Date: 02/01/2023 Goal Status: Active Interventions: Provide education on orientation to the wound center Notes: Pain, Acute or Chronic Nursing Diagnoses: Pain, acute or chronic: actual or potential Potential alteration in comfort, pain Goals: Patient will verbalize adequate pain control and receive pain control interventions during procedures as needed [name], [name] ([id number]) [id number] Page 7 of 12 Date Initiated: 12/27/2022 Target Resolution Date: 02/01/2023 Goal Status: Active Patient/caregiver will verbalize comfort level met Date Initiated: 12/27/2022 Target Resolution Date: 01/31/2023 Goal Status: Active Interventions: Complete pain assessment as per visit requirements Encourage patient to take pain medications as prescribed Provide education on pain management Treatment Activities: Administer pain control measures as ordered : 12/27/2022 Notes: Wound/Skin Impairment Nursing Diagnoses: Knowledge deficit related to ulceration/compromised skin integrity Goals: Patient/caregiver will verbalize understanding of skin care regimen Date Initiated: 12/27/2022 Target Resolution Date: 01/31/2023 Goal Status: Active Interventions: Assess patient/caregiver ability to perform ulcer/skin care regimen upon admission and as needed Assess  ulceration(s) every visit Provide education on ulcer and skin care Treatment Activities: Skin care regimen initiated : 12/27/2022 Topical wound management initiated : 12/27/2022 Notes: Electronic Signature(s) Signed: 12/27/2022 6:11:27 PM By: Deon Pilling RN, BSN Entered By: Deon Pilling on 12/27/2022 09:33:39 -------------------------------------------------------------------------------- Pain Assessment Details Patient Name: Date of Service: [name] 12/27/2022 9:00 A M Medical Record Number: [id number] Patient Account Number: [id number] Date of Birth/Sex: Treating RN: 1996/10/03 (27 y.o. Hessie Diener Primary Care Desiree Fleming: Mina Marble Other Clinician: Referring Wm Sahagun: Treating Jezabella Schriever/Extender: Tonita Cong in Treatment: 0 Active Problems Location of Pain Severity and Description of Pain Patient Has Paino No Site Locations Rate the pain. [name], [name] ([id number]) [id number] Page 8 of 12 Rate the pain. Current Pain Level: 0 Pain Management and Medication Current Pain Management: Medication: No Cold Application: No Rest: No Massage: No Activity: No T.BrownN.S.: No Heat Application: No Leg drop or elevation: No Is the Current Pain Management Adequate: Adequate How does your wound impact your activities of daily livingo Sleep: No Bathing: No Appetite: No Relationship With Others: No Bladder Continence: No Emotions: No Bowel Continence: No Work: No Toileting: No Drive: No Dressing: No Hobbies: No Engineer, maintenance) Signed: 12/27/2022 6:11:27 PM By: Deon Pilling RN, BSN Entered By: Deon Pilling on 12/27/2022 09:17:57 -------------------------------------------------------------------------------- Patient/Caregiver Education Details Patient Name: Date of Service: [name], Lucas E. 1/4/2024andnbsp9:00 A M Medical Record Number: [id number] Patient Account Number: [id number] Date of  Birth/Gender: Treating RN: Apr 29, 1996 (27 y.o. Hessie Diener Primary Care Physician: Mina Marble Other Clinician: Referring Physician: Treating Physician/Extender: Tonita Cong in Treatment: 0 Education Assessment Education Provided To: Patient Education Topics Provided Welcome T [facility]-New Patient Packet: o Handouts: The Wound Healing Pledge form, Welcome T [facility] o Methods: Explain/Verbal, Printed Responses: Reinforcements needed Wound Debridement: Handouts: Wound Debridement Methods: Explain/Verbal, Printed Responses: Reinforcements needed [name], LUDLUM ([id number]) [id number] Page 9 of 12 Electronic Signature(s) Signed: 12/27/2022 6:11:27 PM By: Rolin Barry,  Tammi Klippel RN, BSN Entered By: Deon Pilling on 12/27/2022 09:34:01 -------------------------------------------------------------------------------- Wound Assessment Details Patient Name: Date of Service: [name], [name] 12/27/2022 9:00 A M Medical Record Number: [id number] Patient Account Number: [id number] Date of Birth/Sex: Treating RN: [birth year] (27 y.o. Hessie Diener Primary Care Janai Maudlin: Mina Marble Other Clinician: Referring Betzalel Umbarger: Treating Mozell Haber/Extender: Tonita Cong in Treatment: 0 Wound Status Wound Number: 1 Primary Etiology: Diabetic Wound/Ulcer of the Lower Extremity Wound Location: Right Metatarsal head fifth Wound Status: Open Wounding Event: Gradually Appeared Comorbid History: Hypertension, Type I Diabetes, Neuropathy Date Acquired: 12/25/2019 Weeks Of Treatment: 0 Clustered Wound: No Photos Wound Measurements Length: (cm) 0.3 Width: (cm) 0.3 Depth: (cm) 0.2 Area: (cm) 0.071 Volume: (cm) 0.014 % Reduction in Area: % Reduction in Volume: Epithelialization: Small (1-33%) Tunneling: No Undermining: Yes Starting Position (o'clock): 12 Ending Position  (o'clock): 12 Maximum Distance: (cm) 0.6 Wound Description Classification: Grade 2 Wound Margin: Distinct, outline attached Exudate Amount: Medium Exudate Type: Serosanguineous Exudate Color: red, brown Foul Odor After Cleansing: No Slough/Fibrino No Wound Bed Granulation Amount: Large (67-100%) Exposed Structure Granulation Quality: Red Fascia Exposed: No Necrotic Amount: None Present (0%) Fat Layer (Subcutaneous Tissue) Exposed: Yes Tendon Exposed: No Muscle Exposed: No Joint Exposed: No Bone Exposed: No Periwound Skin Texture Texture Color No Abnormalities Noted: No No Abnormalities Noted: No Callus: Yes Atrophie BlancheIOANE, BHOLA ([id number]) [id number] Page 10 of 12 Crepitus: No Cyanosis: No Excoriation: No Ecchymosis: No Induration: No Erythema: No Rash: No Hemosiderin Staining: No Scarring: No Mottled: No Pallor: No Moisture Rubor: No No Abnormalities Noted: No Dry / Scaly: No Maceration: No Electronic Signature(s) Signed: 12/27/2022 6:11:27 PM By: Deon Pilling RN, BSN Signed: 01/01/2023 12:25:13 PM By: Donavan Burnet CHT EMT BS , , Entered By: Donavan Burnet on 12/27/2022 09:28:34 -------------------------------------------------------------------------------- Wound Assessment Details Patient Name: Date of Service: [name] 12/27/2022 9:00 A M Medical Record Number: [id number] Patient Account Number: [id number] Date of Birth/Sex: Treating RN: 03-Apr-1996 (27 y.o. Hessie Diener Primary Care Severiano Utsey: Mina Marble Other Clinician: Referring Elenora Hawbaker: Treating Shaquile Lutze/Extender: Tonita Cong in Treatment: 0 Wound Status Wound Number: 2 Primary Etiology: Diabetic Wound/Ulcer of the Lower Extremity Wound Location: Left Metatarsal head fifth Wound Status: Open Wounding Event: Gradually Appeared Comorbid History: Hypertension, Type I Diabetes, Neuropathy Date  Acquired: 09/24/2022 Weeks Of Treatment: 0 Clustered Wound: No Photos Wound Measurements Length: (cm) 0.2 Width: (cm) 0.3 Depth: (cm) 0.3 Area: (cm) 0.047 Volume: (cm) 0.014 % Reduction in Area: % Reduction in Volume: Epithelialization: None Tunneling: No Undermining: Yes Starting Position (o'clock): 12 Ending Position (o'clock): 12 Maximum Distance: (cm) 1.4 Wound Description Classification: Grade 2 Wound Margin: Distinct, outline attached Exudate Amount: Medium Exudate Type: Serosanguineous Exudate Color: red, brown Foul Odor After Cleansing: No Slough/Fibrino No Wound Bed ZIV, WELCHEL ([id number]) [id number] Page 11 of 12 Granulation Amount: Large (67-100%) Exposed Structure Granulation Quality: Red Fascia Exposed: No Necrotic Amount: None Present (0%) Fat Layer (Subcutaneous Tissue) Exposed: Yes Tendon Exposed: No Muscle Exposed: No Joint Exposed: No Bone Exposed: No Periwound Skin Texture Texture Color No Abnormalities Noted: No No Abnormalities Noted: No Callus: Yes Atrophie Blanche: No Crepitus: No Cyanosis: No Excoriation: No Ecchymosis: No Induration: No Erythema: No Rash: No Hemosiderin Staining: No Scarring: No Mottled: No Pallor: No Moisture Rubor: No No Abnormalities Noted: No Dry / Scaly: No Maceration: No Electronic Signature(s) Signed: 12/27/2022 6:11:27 PM By: Deon Pilling RN, BSN Signed: 01/01/2023 12:25:13 PM By: Donavan Burnet CHT EMT BS , ,  Entered By: Donavan Burnet on 12/27/2022 09:26:02 -------------------------------------------------------------------------------- Wound Assessment Details Patient Name: Date of Service: [name], [name] 12/27/2022 9:00 A M Medical Record Number: [id number] Patient Account Number: [id number] Date of Birth/Sex: Treating RN: 10-02-1996 (27 y.o. Hessie Diener Primary Care Gyanna Jarema: Mina Marble Other Clinician: Referring Lynna Zamorano: Treating  Frida Wahlstrom/Extender: Tonita Cong in Treatment: 0 Wound Status Wound Number: 2 Primary Etiology: Diabetic Wound/Ulcer of the Lower Extremity Wound Location: Left Metatarsal head fifth Wound Status: Open Wounding Event: Gradually Appeared Comorbid History: Hypertension, Type I Diabetes, Neuropathy Date Acquired: 09/24/2022 Weeks Of Treatment: 0 Clustered Wound: No Photos Wound Measurements Length: (cm) 0.2 Width: (cm) 0.3 Depth: (cm) 0.3 Area: (cm) 0.047 Volume: (cm) 0.014 Marcantel, Tyran E ([id number]) % Reduction in Area: % Reduction in Volume: Epithelialization: None Tunneling: No Undermining: Yes Starting Position (o'clock): 12 Ending Position (o'clock): 12 [id number] Page 12 of 12 Maximum Distance: (cm) 1.4 Wound Description Classification: Grade 2 Wound Margin: Distinct, outline attached Exudate Amount: Medium Exudate Type: Serosanguineous Exudate Color: red, brown Foul Odor After Cleansing: No Slough/Fibrino No Wound Bed Granulation Amount: Large (67-100%) Exposed Structure Granulation Quality: Red Fascia Exposed: No Necrotic Amount: None Present (0%) Fat Layer (Subcutaneous Tissue) Exposed: Yes Tendon Exposed: No Muscle Exposed: No Joint Exposed: No Bone Exposed: No Periwound Skin Texture Texture Color No Abnormalities Noted: No No Abnormalities Noted: No Callus: Yes Atrophie Blanche: No Crepitus: No Cyanosis: No Excoriation: No Ecchymosis: No Induration: No Erythema: No Rash: No Hemosiderin Staining: No Scarring: No Mottled: No Pallor: No Moisture Rubor: No No Abnormalities Noted: No Dry / Scaly: No Maceration: No Electronic Signature(s) Signed: 12/27/2022 6:11:27 PM By: Deon Pilling RN, BSN Signed: 01/01/2023 12:25:13 PM By: Donavan Burnet CHT EMT BS , , Entered By: Donavan Burnet on 12/27/2022  09:27:10 -------------------------------------------------------------------------------- Vitals Details Patient Name: Date of Service: [name]. 12/27/2022 9:00 A M Medical Record Number: [id number] Patient Account Number: [id number] Date of Birth/Sex: Treating RN: 10/01/1996 (27 y.o. E[place] Noe Primary Care Temekia Caskey: Mina Marble Other Clinician: Referring Alekzander Cardell: Treating Zharia Conrow/Extender: Tonita Cong in Treatment: 0 Vital Signs Time Taken: 09:07 Temperature (F): 97.8 Pulse (bpm): 88 Respiratory Rate (breaths/min): 18 Blood Pressure (mmHg): 117/79 Reference Range: 80 - 120 mg / dl Electronic Signature(s) Signed: 12/28/2022 12:14:03 PM By: Rhae Hammock RN Entered By: Rhae Hammock on 12/27/2022 09:07:56

## 2023-01-03 ENCOUNTER — Encounter (HOSPITAL_BASED_OUTPATIENT_CLINIC_OR_DEPARTMENT_OTHER): Payer: BC Managed Care – PPO | Admitting: Internal Medicine

## 2023-01-03 ENCOUNTER — Ambulatory Visit: Payer: BC Managed Care – PPO | Admitting: Podiatry

## 2023-01-03 DIAGNOSIS — L97518 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ity: Secondary | ICD-10-CM | POA: Diagnosis not present

## 2023-01-03 DIAGNOSIS — I10 Essential (primary) hypertension: Secondary | ICD-10-CM | POA: Diagnosis not present

## 2023-01-03 DIAGNOSIS — Z89421 Acquired absence of other right toe(s): Secondary | ICD-10-CM | POA: Diagnosis not present

## 2023-01-03 DIAGNOSIS — L97528 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ty: Secondary | ICD-10-CM | POA: Diagnosis not present

## 2023-01-03 DIAGNOSIS — E1040 Type 1 diabetes mellitus with diabetic neuropathy, unspecified: Secondary | ICD-10-CM

## 2023-01-03 DIAGNOSIS — E10621 Type 1 diabetes mellitus with foot ulcer: Secondary | ICD-10-CM

## 2023-01-03 DIAGNOSIS — M869 Osteomyelitis, unspecified: Secondary | ICD-10-CM | POA: Diagnosis not present

## 2023-01-03 NOTE — Progress Notes (Signed)
KIER, SMEAD ([id number]) [id number] Page 1 of 7 Visit Report for 01/03/2023 Chief Complaint Document Details Patient Name: Date of Service: [name], [name] 01/03/2023 8:45 A M Medical Record Number: [id number] Patient Account Number: [id number] Date of Birth/Sex: Treating RN: 1996/11/16 (27 y.o. M) Primary Care Provider: Mina Marble Other Clinician: Referring Provider: Treating Provider/Extender: Olivia Mackie Weeks in Treatment: 1 Information Obtained from: Patient Chief Complaint 12/27/2022; bilateral diabetic foot wounds Electronic Signature(s) Signed: 01/03/2023 9:59:16 AM By: Kalman Shan DO Entered By: Kalman Shan on 01/03/2023 09:29:11 -------------------------------------------------------------------------------- HPI Details Patient Name: Date of Service: [name] 01/03/2023 8:45 A M Medical Record Number: [id number] Patient Account Number: [id number] Date of Birth/Sex: Treating RN: 1996/06/30 (27 y.o. 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 He has been following with podiatry for this issue. He reports using Silvadene to the areas. Currently not using anything for offloading. He states that the right foot wound has been present for the past 1-1/2 years. He states that the left foot wound appeared about 3 months ago. He has not had recent imaging of the areas. Currently denies systemic signs of infection. 01/03/2023; patient presents for follow-up. He has been using Medihoney and  silver alginate to the wound beds. He has been using his surgical shoes with peg assist. He has not been scheduled for an MRI yet. There is been improvement in wound healing. He currently denies signs of infection. Electronic Signature(s) Signed: 01/03/2023 9:59:16 AM By: Kalman Shan DO Entered By: Kalman Shan on 01/03/2023 09:29:38 -------------------------------------------------------------------------------- Physical Exam Details Patient Name: Date of Service: [name], [name] 01/03/2023 8:45 A M Medical Record Number: [id number] Patient Account Number: [id number] Date of Birth/Sex: Treating RN: 04/22/1996 (27 y.o. ELIJAH, PHOMMACHANH ([id number]) [id number] Page 2 of 7 Primary Care Provider: Mina Marble Other Clinician: Referring Provider: Treating Provider/Extender: Olivia Mackie Weeks in Treatment: 1 Constitutional respirations regular, non-labored and within target range for patient.Marland Kitchen Psychiatric pleasant and cooperative. Notes Bilateral fifth met head wounds to the feet. Granulation tissue present. Minimal undermining to the wound beds. No signs of infection including increased warmth, erythema or purulent drainage. Electronic Signature(s) Signed: 01/03/2023 9:59:16 AM By: Kalman Shan DO Entered By: Kalman Shan on 01/03/2023 09:33:38 -------------------------------------------------------------------------------- Physician Orders Details Patient Name: Date of Service: [name], [name] 01/03/2023 8:45 A M Medical Record Number: [id number] Patient Account Number: [id number] Date of Birth/Sex: Treating RN: 04-03-1996 (27 y.o. [name] Primary Care Provider: Mina Marble Other Clinician: Referring Provider: Treating Provider/Extender: Olivia Mackie Weeks in Treatment: 1 Verbal / Phone Orders: No Diagnosis Coding ICD-10 Coding Code Description L97.518 Non-pressure  chronic ulcer of other part of right foot with other specified severity L97.528 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ty E10.40 Type 1 diabetes mellitus with diabetic neuropathy, unspecified E10.621 Type 1 diabetes mellitus with foot ulcer Follow-up Appointments ppointment in 1 week. - Thursday with Dr. Heber [place] on 01/10/2023 1030 Return A ppointment in 2 weeks. - Thursday with Dr. Heber [place] on 01/17/2023 1030 Return A Other: - Call [facility] [phone] to schedule MRI Anesthetic (In clinic) Topical Lidocaine 4% applied to  wound bed Bathing/ Shower/ Hygiene May shower with protection but do not get wound dressing(s) wet. Protect dressing(s) with water repellant cover (for example, large plastic bag) or a cast cover and may then take shower. Off-Loading Open toe surgical shoe to: - both surgical shoes with PEG ASSIST to both shoes. Wear while walking and standing. DO not walk barefoot or just in socks. Additional Orders / Instructions Follow Nutritious Diet - CLOSELY MONITOR BLOOD SUGAR. Wound Treatment Wound #1 - Metatarsal head fifth Wound Laterality: Right Cleanser: Wound Cleanser (Generic) 1 x Per Day/30 Days Discharge Instructions: Cleanse the wound with wound cleanser prior to applying a clean dressing using gauze sponges, not tissue or cotton balls. Peri-Wound Care: Skin Prep (Generic) 1 x Per Day/30 Days Discharge Instructions: Use skin prep as directed Prim Dressing: MediHoney Gel, tube 1.5 (oz) 1 x Per Day/30 Days ary Discharge Instructions: Apply to wound bed as instructed [name], [name] ([id number]) [id number] Page 3 of 7 Prim Dressing: Sorbalgon AG Dressing 2x2 (in/in) (Generic) 1 x Per Day/30 Days ary Discharge Instructions: ***APPLY OVER THE MEDIHONEY AND DO NOT PACK THE WOUND.*** Secondary Dressing: Optifoam Non-Adhesive Dressing, 4x4 in (Generic) 1 x Per Day/30 Days Discharge  Instructions: Apply ONE LAYER OF FOAM D Secondary Dressing: Woven Gauze Sponges 2x2 in (Generic) 1 x Per Day/30 Days Discharge Instructions: Apply over primary dressing as directed. Secured With: Child psychotherapist, Sterile 2x75 (in/in) (Generic) 1 x Per Day/30 Days Discharge Instructions: Secure with stretch gauze as directed. Secured With: 514M Medipore H Soft Cloth Surgical T ape, 4 x 10 (in/yd) (Generic) 1 x Per Day/30 Days Discharge Instructions: Secure with tape as directed. Wound #2 - Metatarsal head fifth Wound Laterality: Left Cleanser: Wound Cleanser (Generic) 1 x Per Day/30 Days Discharge Instructions: Cleanse the wound with wound cleanser prior to applying a clean dressing using gauze sponges, not tissue or cotton balls. Peri-Wound Care: Skin Prep (Generic) 1 x Per Day/30 Days Discharge Instructions: Use skin prep as directed Prim Dressing: MediHoney Gel, tube 1.5 (oz) 1 x Per Day/30 Days ary Discharge Instructions: Apply to wound bed as instructed Prim Dressing: Sorbalgon AG Dressing 2x2 (in/in) (Generic) 1 x Per Day/30 Days ary Discharge Instructions: ***APPLY OVER THE MEDIHONEY AND DO NOT PACK THE WOUND.*** Secondary Dressing: Optifoam Non-Adhesive Dressing, 4x4 in (Generic) 1 x Per Day/30 Days Discharge Instructions: Apply ONE LAYER OF FOAM D Secondary Dressing: Woven Gauze Sponges 2x2 in (Generic) 1 x Per Day/30 Days Discharge Instructions: Apply over primary dressing as directed. Secured With: Child psychotherapist, Sterile 2x75 (in/in) (Generic) 1 x Per Day/30 Days Discharge Instructions: Secure with stretch gauze as directed. Secured With: 514M Medipore H Soft Cloth Surgical T ape, 4 x 10 (in/yd) (Generic) 1 x Per Day/30 Days Discharge Instructions: Secure with tape as directed. Electronic Signature(s) Signed: 01/03/2023 9:59:16 AM By: Kalman Shan DO Entered By: Kalman Shan on 01/03/2023  09:38:18 -------------------------------------------------------------------------------- Problem List Details Patient Name: Date of Service: [name], [name] 01/03/2023 8:45 A M Medical Record Number: [id number] Patient Account Number: [id number] Date of Birth/Sex: Treating RN: 11/03/1996 (27 y.o. [name] Primary Care Provider: Mina Marble Other Clinician: Referring Provider: Treating Provider/Extender: Olivia Mackie Weeks in Treatment: 1 Active Problems ICD-10 Encounter Code Description Active Date MDM Diagnosis L97.518 Non-pressure chronic ulcer of other part of right foot with other specified 12/27/2022 No Yes severity L97.528 Non-pressure chronic ulcer of other part of left foot with other specified 12/27/2022 No Yes [name], [name] ([id number])  [id number] Page 4 of 7 severity E10.40 Type 1 diabetes mellitus with diabetic neuropathy, unspecified 12/27/2022 No Yes E10.621 Type 1 diabetes mellitus with foot ulcer 12/27/2022 No Yes Inactive Problems Resolved Problems Electronic Signature(s) Signed: 01/03/2023 9:59:16 AM By: Kalman Shan DO Entered By: Kalman Shan on 01/03/2023 09:28:58 -------------------------------------------------------------------------------- Progress Note Details Patient Name: Date of Service: [name] 01/03/2023 8:45 A M Medical Record Number: [id number] Patient Account Number: [id number] Date of Birth/Sex: Treating RN: 1996-07-29 (27 y.o. M) Primary Care Provider: Mina Marble Other Clinician: Referring Provider: Treating Provider/Extender: Olivia Mackie Weeks in Treatment: 1 Subjective Chief Complaint Information obtained from Patient 12/27/2022; bilateral diabetic foot wounds History of Present Illness (HPI) 12/27/2022 Mr. Brayan Votaw is a 27 year old male with a past medical history of uncontrolled type 1 diabetes, osteomyelitis of the fifth  right toe status post amputation 05/24/21 that presents to the clinic for bilateral fifth metatarsal diabetic foot wounds. He has been following with podiatry for this issue. He reports using Silvadene to the areas. Currently not using anything for offloading. He states that the right foot wound has been present for the past 1-1/2 years. He states that the left foot wound appeared about 3 months ago. He has not had recent imaging of the areas. Currently denies systemic signs of infection. 01/03/2023; patient presents for follow-up. He has been using Medihoney and silver alginate to the wound beds. He has been using his surgical shoes with peg assist. He has not been scheduled for an MRI yet. There is been improvement in wound healing. He currently denies signs of infection. Patient History Information obtained from Patient, Chart. Family History Unknown History. Social History Never smoker, Marital Status - Single, Alcohol Use - Never, Drug Use - No History, Caffeine Use - Rarely. Medical History Cardiovascular Patient has history of Hypertension Endocrine Patient has history of Type I Diabetes Denies history of Type II Diabetes Neurologic Patient has history of Neuropathy Hospitalization/Surgery History - R foot wound debridement and closure. 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 Medical A Surgical History Notes nd Cardiovascular heart murmur [name], [name] ([id number]) [id number] Page 5 of 7 Objective Constitutional respirations regular, non-labored and within target range for patient.. Vitals Time Taken: 8:43 AM, Temperature: 97.8 F, Pulse: 85 bpm, Respiratory Rate: 20 breaths/min, Blood Pressure: 129/86 mmHg, Capillary Blood Glucose: 80 mg/dl. Psychiatric pleasant and cooperative. General Notes: Bilateral fifth met head wounds to the feet. Granulation tissue present. Minimal undermining to the wound beds. No signs of infection including increased warmth, erythema  or purulent drainage. Integumentary (Hair, Skin) Wound #1 status is Open. Original cause of wound was Gradually Appeared. The date acquired was: 12/25/2019. The wound has been in treatment 1 weeks. The wound is located on the Right Metatarsal head fifth. The wound measures 0.2cm length x 0.2cm width x 0.4cm depth; 0.031cm^2 area and 0.013cm^3 volume. There is Fat Layer (Subcutaneous Tissue) exposed. There is no tunneling noted, however, there is undermining starting at 12:00 and ending at 12:00 with a maximum distance of 0.4cm. There is a medium amount of serosanguineous drainage noted. The wound margin is distinct with the outline attached to the wound base. There is large (67-100%) red granulation within the wound bed. There is no necrotic tissue within the wound bed. The periwound skin appearance exhibited: Callus. The periwound skin appearance did not exhibit: Crepitus, Excoriation, Induration, Rash, Scarring, Dry/Scaly, Maceration, Atrophie Blanche, Cyanosis, Ecchymosis, Hemosiderin Staining, Mottled, Pallor, Rubor, Erythema. Wound #2 status is Open. Original cause of wound was Gradually  Appeared. The date acquired was: 09/24/2022. The wound has been in treatment 1 weeks. The wound is located on the Left Metatarsal head fifth. The wound measures 0.2cm length x 0.4cm width x 0.3cm depth; 0.063cm^2 area and 0.019cm^3 volume. There is Fat Layer (Subcutaneous Tissue) exposed. There is no tunneling or undermining noted. There is a medium amount of serosanguineous drainage noted. The wound margin is distinct with the outline attached to the wound base. There is large (67-100%) red granulation within the wound bed. There is no necrotic tissue within the wound bed. The periwound skin appearance exhibited: Callus. The periwound skin appearance did not exhibit: Crepitus, Excoriation, Induration, Rash, Scarring, Dry/Scaly, Maceration, Atrophie Blanche, Cyanosis, Ecchymosis, Hemosiderin Staining, Mottled,  Pallor, Rubor, Erythema. 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 There has been improvement in appearance since last clinic visit. I recommended continuing Medihoney and silver alginate with aggressive offloading. No signs of Soft tissue infection. Follow-up in 1 week. Plan Follow-up Appointments: Return Appointment in 1 week. - Thursday with Dr. Heber [place] on 01/10/2023 1030 Return Appointment in 2 weeks. - Thursday with Dr. Heber [place] on 01/17/2023 1030 Other: - Call [facility] [phone] to schedule MRI Anesthetic: (In clinic) Topical Lidocaine 4% applied to wound bed Bathing/ Shower/ Hygiene: May shower with protection but do not get wound dressing(s) wet. Protect dressing(s) with water repellant cover (for example, large plastic bag) or a cast cover and may then take shower. Off-Loading: Open toe surgical shoe to: - both surgical shoes with PEG ASSIST to both shoes. Wear while walking and standing. DO not walk barefoot or just in socks. Additional Orders / Instructions: Follow Nutritious Diet - CLOSELY MONITOR BLOOD SUGAR. WOUND #1: - Metatarsal head fifth Wound Laterality: Right Cleanser: Wound Cleanser (Generic) 1 x Per Day/30 Days Discharge Instructions: Cleanse the wound with wound cleanser prior to applying a clean dressing using gauze sponges, not tissue or cotton balls. Peri-Wound Care: Skin Prep (Generic) 1 x Per Day/30 Days Discharge Instructions: Use skin prep as directed Prim Dressing: MediHoney Gel, tube 1.5 (oz) 1 x Per Day/30 Days ary Discharge Instructions: Apply to wound bed as instructed Prim Dressing: Sorbalgon AG Dressing 2x2 (in/in) (Generic) 1 x Per Day/30  Days ary Discharge Instructions: ***APPLY OVER THE [place] AND DO NOT PACK THE WOUND.*** Secondary Dressing: Optifoam Non-Adhesive Dressing, 4x4 in (Generic) 1 x Per Day/30 Days Discharge Instructions: Apply ONE LAYER OF [name], [name] ([id number]) [id number] Page 6 of 7 Secondary Dressing: Woven Gauze Sponges 2x2 in (Generic) 1 x Per Day/30 Days Discharge Instructions: Apply over primary dressing as directed. Secured With: Child psychotherapist, Sterile 2x75 (in/in) (Generic) 1 x Per Day/30 Days Discharge Instructions: Secure with stretch gauze as directed. Secured With: 25M Medipore H Soft Cloth Surgical T ape, 4 x 10 (in/yd) (Generic) 1 x Per Day/30 Days Discharge Instructions: Secure with tape as directed. WOUND #2: - Metatarsal head fifth Wound Laterality: Left Cleanser: Wound Cleanser (Generic) 1 x Per Day/30 Days Discharge Instructions: Cleanse the wound with wound cleanser prior to applying a clean dressing using gauze sponges, not tissue or cotton balls. Peri-Wound Care: Skin Prep (Generic) 1 x Per Day/30 Days Discharge Instructions: Use skin prep as directed Prim Dressing: MediHoney Gel, tube 1.5 (oz) 1 x  Per Day/30 Days ary Discharge Instructions: Apply to wound bed as instructed Prim Dressing: Sorbalgon AG Dressing 2x2 (in/in) (Generic) 1 x Per Day/30 Days ary Discharge Instructions: ***APPLY OVER THE MEDIHONEY AND DO NOT PACK THE WOUND.*** Secondary Dressing: Optifoam Non-Adhesive Dressing, 4x4 in (Generic) 1 x Per Day/30 Days Discharge Instructions: Apply ONE LAYER OF FOAM D Secondary Dressing: Woven Gauze Sponges 2x2 in (Generic) 1 x Per Day/30 Days Discharge Instructions: Apply over primary dressing as directed. Secured With: Child psychotherapist, Sterile 2x75 (in/in) (Generic) 1 x Per Day/30 Days Discharge Instructions: Secure with stretch gauze as directed. Secured With: 28M Medipore H Soft Cloth Surgical T  ape, 4 x 10 (in/yd) (Generic) 1 x Per Day/30 Days Discharge Instructions: Secure with tape as directed. 1. Silver alginate with Medihoney 2. Aggressive offloadingoosurgical shoe with peg assist bilaterally to the feet 3. Follow-up in 1 week Electronic Signature(s) Signed: 01/03/2023 9:59:16 AM By: Kalman Shan DO Entered By: Kalman Shan on 01/03/2023 09:39:27 -------------------------------------------------------------------------------- HxROS Details Patient Name: Date of Service: [name], [name] 01/03/2023 8:45 A M Medical Record Number: [id number] Patient Account Number: [id number] Date of Birth/Sex: Treating RN: [birth year] (27 y.o. M) Primary Care Provider: Mina Marble Other Clinician: Referring Provider: Treating Provider/Extender: Olivia Mackie Weeks in Treatment: 1 Information Obtained From Patient Chart Cardiovascular Medical History: Positive for: Hypertension Past Medical History Notes: heart murmur Endocrine Medical History: Positive for: Type I Diabetes Negative for: Type II Diabetes Time with diabetes: 1 year ago Treated with: Insulin, Diet Blood sugar tested every day: Yes Tested : TID Neurologic Medical History: Positive for: Neuropathy Immunizations Pneumococcal Vaccine: Received Pneumococcal Vaccination: No [name], [name] ([id number]) [id number] Page 7 of 7 Implantable Devices None Hospitalization / Surgery History Type of Hospitalization/Surgery 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 Family and Social History Unknown History: Yes; Never smoker; Marital Status - Single; Alcohol Use: Never; Drug Use: No History; Caffeine Use: Rarely; Financial Concerns: No; Food, Clothing or Shelter Needs: No; Support System Lacking: No; Transportation Concerns: No Electronic Signature(s) Signed: 01/03/2023 9:59:16 AM By: Kalman Shan DO Entered By: Kalman Shan on  01/03/2023 09:29:44 -------------------------------------------------------------------------------- SuperBill Details Patient Name: Date of Service: [name] 01/03/2023 Medical Record Number: [id number] Patient Account Number: [id number] Date of Birth/Sex: Treating RN: 1996/05/24 (27 y.o. [name] Primary Care Provider: Mina Marble Other Clinician: Referring Provider: Treating Provider/Extender: Olivia Mackie Weeks in Treatment: 1 Diagnosis Coding ICD-10 Codes Code Description [phone] 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ity L97.528 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ty E10.40 Type 1 diabetes mellitus with diabetic neuropathy, unspecified E10.621 Type 1 diabetes mellitus with foot ulcer Facility Procedures : CPT4 Code: [id number] Description: 99214 - WOUND CARE VISIT-LEV 4 EST PT Modifier: Quantity: 1 Physician Procedures : CPT4 Code Description Modifier [id number] - WC PHYS LEVEL 3 - EST PT ICD-10 Diagnosis Description L97.518 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ity L97.528 Non-pressure chronic ulcer of other part of left  foot with other specified severity E10.40 Type 1 diabetes mellitus with diabetic neuropathy, unspecified E10.621 Type 1 diabetes mellitus with foot ulcer Quantity: 1 Electronic Signature(s) Signed: 01/03/2023 9:59:16 AM By: Kalman Shan DO Entered By: Kalman Shan on 01/03/2023 09:39:41

## 2023-01-04 NOTE — Progress Notes (Signed)
NAS, WAFER ([id number]) [id number] Page 1 of 12 Visit Report for 01/03/2023 Arrival Information Details Patient Name: Date of Service: [name], [name] 01/03/2023 8:45 A M Medical Record Number: [id number] Patient Account Number: [id number] Date of Birth/Sex: Treating RN: 03-Apr-1996 (27 y.o. Hessie Diener Primary Care Tocara Mennen: Mina Marble Other Clinician: Referring Kiran Lapine: Treating Scotti Motter/Extender: Olivia Mackie Weeks in Treatment: 1 Visit Information History Since Last Visit Added or deleted any medications: No Patient Arrived: Ambulatory Any new allergies or adverse reactions: No Arrival Time: 08:34 Had a fall or experienced change in No Accompanied By: self activities of daily living that may affect Transfer Assistance: None risk of falls: Patient Identification Verified: Yes Signs or symptoms of abuse/neglect since No Secondary Verification Process Completed: Yes last visito Patient Requires Transmission-Based Precautions: Yes Hospitalized since last visit: No Transmission-Based Precautions: Contact MRSA Implantable device outside of the clinic No Patient Has Alerts: No excluding cellular tissue based products placed in the center since last visit: Has Dressing in Place as Prescribed: Yes Has Footwear/Offloading in Place as Yes Prescribed: Left: Surgical Shoe with Pressure Relief Insole Right: Surgical Shoe with Pressure Relief Insole Pain Present Now: No Electronic Signature(s) Signed: 01/03/2023 5:18:21 PM By: Deon Pilling RN, BSN Entered By: Deon Pilling on 01/03/2023 08:35:01 -------------------------------------------------------------------------------- Clinic Level of Care Assessment Details Patient Name: Date of Service: [name], [name] 01/03/2023 8:45 A M Medical Record Number: [id number] Patient Account Number: [id number] Date of Birth/Sex: Treating RN: Apr 01, 1996 (27 y.o. Hessie Diener Primary Care Maciah Schweigert: Mina Marble Other Clinician: Referring [place] Jon: Treating Petina Muraski/Extender: Olivia Mackie Weeks in Treatment: 1 Clinic Level of Care Assessment Items TOOL 4 Quantity Score X- 1 0 Use when only an EandM is performed on FOLLOW-UP visit ASSESSMENTS - Nursing Assessment / Reassessment X- 1 10 Reassessment of Co-morbidities (includes updates in patient status) X- 1 5 Reassessment of Adherence to Treatment Plan ASSESSMENTS - Wound and Skin A ssessment / Reassessment '[]'$  - 0 Simple Wound Assessment / Reassessment - one wound X- 2 5 Complex Wound Assessment / Reassessment - multiple wounds '[]'$  - 0 Dermatologic / Skin Assessment (not related to wound area) [name], [name] E ([id number]) [id number] Page 2 of 12 ASSESSMENTS - Focused Assessment X- 2 5 Circumferential Edema Measurements - multi extremities '[]'$  - 0 Nutritional Assessment / Counseling / Intervention '[]'$  - 0 Lower Extremity Assessment (monofilament, tuning fork, pulses) '[]'$  - 0 Peripheral Arterial Disease Assessment (using hand held doppler) ASSESSMENTS - Ostomy and/or Continence Assessment and Care '[]'$  - 0 Incontinence Assessment and Management '[]'$  - 0 Ostomy Care Assessment and Management (repouching, etc.) PROCESS - Coordination of Care '[]'$  - 0 Simple Patient / Family Education for ongoing care X- 1 20 Complex (extensive) Patient / Family Education for ongoing care X- 1 10 Staff obtains Programmer, systems, Records, T Results / Process Orders est '[]'$  - 0 Staff telephones HHA, Nursing Homes / Clarify orders / etc '[]'$  - 0 Routine Transfer to another Facility (non-emergent condition) '[]'$  - 0 Routine Hospital Admission (non-emergent condition) '[]'$  - 0 New Admissions / Biomedical engineer / Ordering NPWT Apligraf, etc. , '[]'$  - 0 Emergency Hospital Admission (emergent condition) '[]'$  - 0 Simple Discharge Coordination X- 1 15 Complex (extensive)  Discharge Coordination PROCESS - Special Needs '[]'$  - 0 Pediatric / Minor Patient Management '[]'$  - 0 Isolation Patient Management '[]'$  - 0 Hearing / Language / Visual special needs '[]'$  - 0 Assessment of Community assistance (transportation, D/C planning, etc.) '[]'$  - 0 Additional assistance /  Altered mentation '[]'$  - 0 Support Surface(s) Assessment (bed, cushion, seat, etc.) INTERVENTIONS - Wound Cleansing / Measurement '[]'$  - 0 Simple Wound Cleansing - one wound X- 2 5 Complex Wound Cleansing - multiple wounds X- 1 5 Wound Imaging (photographs - any number of wounds) '[]'$  - 0 Wound Tracing (instead of photographs) '[]'$  - 0 Simple Wound Measurement - one wound X- 2 5 Complex Wound Measurement - multiple wounds INTERVENTIONS - Wound Dressings X - Small Wound Dressing one or multiple wounds 2 10 '[]'$  - 0 Medium Wound Dressing one or multiple wounds '[]'$  - 0 Large Wound Dressing one or multiple wounds '[]'$  - 0 Application of Medications - topical '[]'$  - 0 Application of Medications - injection INTERVENTIONS - Miscellaneous '[]'$  - 0 External ear exam '[]'$  - 0 Specimen Collection (cultures, biopsies, blood, body fluids, etc.) '[]'$  - 0 Specimen(s) / Culture(s) sent or taken to Lab for analysis '[]'$  - 0 Patient Transfer (multiple staff / Stormy Fabian / Similar devices) [name], [name] ([id number]) [id number] Page 3 of 12 '[]'$  - 0 Simple Staple / Suture removal (25 or less) '[]'$  - 0 Complex Staple / Suture removal (26 or more) '[]'$  - 0 Hypo / Hyperglycemic Management (close monitor of Blood Glucose) '[]'$  - 0 Ankle / Brachial Index (ABI) - do not check if billed separately X- 1 5 Vital Signs Has the patient been seen at the hospital within the last three years: Yes Total Score: 130 Level Of Care: New/Established - Level 4 Electronic Signature(s) Signed: 01/03/2023 5:18:21 PM By: Deon Pilling RN, BSN Entered By: Deon Pilling on 01/03/2023  09:14:32 -------------------------------------------------------------------------------- Encounter Discharge Information Details Patient Name: Date of Service: [name] 01/03/2023 8:45 A M Medical Record Number: [id number] Patient Account Number: [id number] Date of Birth/Sex: Treating RN: [birth year] (27 y.o. Hessie Diener Primary Care Lashawna Poche: Mina Marble Other Clinician: Referring Antanasia Kaczynski: Treating Colin Norment/Extender: Olivia Mackie Weeks in Treatment: 1 Encounter Discharge Information Items Discharge Condition: Stable Ambulatory Status: Ambulatory Discharge Destination: Home Transportation: Private Auto Accompanied By: self Schedule Follow-up Appointment: Yes Clinical Summary of Care: Electronic Signature(s) Signed: 01/03/2023 5:18:21 PM By: Deon Pilling RN, BSN Entered By: Deon Pilling on 01/03/2023 09:14:58 -------------------------------------------------------------------------------- Lower Extremity Assessment Details Patient Name: Date of Service: KAMRIN, SIBLEY 01/03/2023 8:45 A M Medical Record Number: [id number] Patient Account Number: [id number] Date of Birth/Sex: Treating RN: Jun 27, 1996 (28 y.o. Hessie Diener Primary Care Srihitha Tagliaferri: Mina Marble Other Clinician: Referring Mousa Prout: Treating Joselyn Edling/Extender: Olivia Mackie Weeks in Treatment: 1 Edema Assessment Assessed: [Left: Yes] [Right: Yes] Edema: [Left: No] [Right: No] Calf Left: Right: Point of Measurement: 34 cm From Medial Instep 36 cm 35 cm Ankle [name], [name] ([id number]) [id number] Page 4 of 12 Left: Right: Point of Measurement: 9 cm From Medial Instep 22 cm 21 cm Vascular Assessment Pulses: Dorsalis Pedis Palpable: [Left:Yes] [Right:Yes] Electronic Signature(s) Signed: 01/03/2023 5:18:21 PM By: Deon Pilling RN, BSN Entered By: Deon Pilling on 01/03/2023  08:36:12 -------------------------------------------------------------------------------- Multi Wound Chart Details Patient Name: Date of Service: [name] 01/03/2023 8:45 A M Medical Record Number: [id number] Patient Account Number: [id number] Date of Birth/Sex: Treating RN: [birth year] (27 y.o. M) Primary Care Lelaina Oatis: Mina Marble Other Clinician: Referring Tayvia Faughnan: Treating Sharief Wainwright/Extender: Olivia Mackie Weeks in Treatment: 1 Vital Signs Height(in): Capillary Blood Glucose(mg/dl): 80 Weight(lbs): Pulse(bpm): 85 Body Mass Index(BMI): Blood Pressure(mmHg): 129/86 Temperature(F): 97.8 Respiratory Rate(breaths/min): 20 [1:Photos:] [N/A:N/A] Right Metatarsal head fifth Left Metatarsal head fifth N/A Wound Location: Gradually Appeared Gradually Appeared  N/A Wounding Event: Diabetic Wound/Ulcer of the Lower Diabetic Wound/Ulcer of the Lower N/A Primary Etiology: Extremity Extremity Hypertension, Type I Diabetes, Hypertension, Type I Diabetes, N/A Comorbid History: Neuropathy Neuropathy 12/25/2019 09/24/2022 N/A Date Acquired: 1 1 N/A Weeks of Treatment: Open Open N/A Wound Status: No No N/A Wound Recurrence: Yes Yes N/A Pending A mputation on Presentation: 0.2x0.2x0.4 0.2x0.4x0.3 N/A Measurements L x W x D (cm) 0.031 0.063 N/A A (cm) : rea 0.013 0.019 N/A Volume (cm) : 56.30% -34.00% N/A % Reduction in A rea: 7.10% -35.70% N/A % Reduction in Volume: 12 Starting Position 1 (o'clock): 12 Ending Position 1 (o'clock): 0.4 Maximum Distance 1 (cm): Yes No N/A Undermining: Grade 2 Grade 2 N/A Classification: Medium Medium N/A Exudate A mount: Serosanguineous Serosanguineous N/A Exudate Type: red, brown red, brown N/A Exudate Color: Distinct, outline attached Distinct, outline attached N/A Wound Margin: Large (67-100%) Large (67-100%) N/A Granulation A mount: Red Red N/A Granulation QualityEL, [name]  ([id number]) [id number] Page 5 of 12 None Present (0%) None Present (0%) N/A Necrotic Amount: Fat Layer (Subcutaneous Tissue): Yes Fat Layer (Subcutaneous Tissue): Yes N/A Exposed Structures: Fascia: No Fascia: No Tendon: No Tendon: No Muscle: No Muscle: No Joint: No Joint: No Bone: No Bone: No Small (1-33%) Small (1-33%) N/A Epithelialization: Callus: Yes Callus: Yes N/A Periwound Skin Texture: Excoriation: No Excoriation: No Induration: No Induration: No Crepitus: No Crepitus: No Rash: No Rash: No Scarring: No Scarring: No Maceration: No Maceration: No N/A Periwound Skin Moisture: Dry/Scaly: No Dry/Scaly: No Atrophie Blanche: No Atrophie Blanche: No N/A Periwound Skin Color: Cyanosis: No Cyanosis: No Ecchymosis: No Ecchymosis: No Erythema: No Erythema: No Hemosiderin Staining: No Hemosiderin Staining: No Mottled: No Mottled: No Pallor: No Pallor: No Rubor: No Rubor: No Treatment Notes Wound #1 (Metatarsal head fifth) Wound Laterality: Right Cleanser Wound Cleanser Discharge Instruction: Cleanse the wound with wound cleanser prior to applying a clean dressing using gauze sponges, not tissue or cotton balls. Peri-Wound Care Skin Prep Discharge Instruction: Use skin prep as directed Topical Primary Dressing MediHoney Gel, tube 1.5 (oz) Discharge Instruction: Apply to wound bed as instructed [place] 2x2 (in/in) Discharge Instruction: ***APPLY OVER THE [place] AND DO NOT PACK THE WOUND.*** Secondary Dressing Optifoam Non-Adhesive Dressing, 4x4 in Discharge Instruction: Apply ONE LAYER OF FOAM D Woven Gauze Sponges 2x2 in Discharge Instruction: Apply over primary dressing as directed. Secured With Conforming Stretch Gauze Bandage, Sterile 2x75 (in/in) Discharge Instruction: Secure with stretch gauze as directed. 71M Medipore H Soft Cloth Surgical T ape, 4 x 10 (in/yd) Discharge Instruction: Secure with  tape as directed. Compression Wrap Compression Stockings Add-Ons Wound #2 (Metatarsal head fifth) Wound Laterality: Left Cleanser Wound Cleanser Discharge Instruction: Cleanse the wound with wound cleanser prior to applying a clean dressing using gauze sponges, not tissue or cotton balls. Peri-Wound Care Skin Prep Discharge Instruction: Use skin prep as directed Topical Primary Dressing MediHoney Gel, tube 1.5 (oz) Discharge Instruction: Apply to wound bed as instructed [name], [name] ([id number]) [id number] Page 6 of 12 Sorbalgon AG Dressing 2x2 (in/in) Discharge Instruction: ***APPLY OVER THE [place] AND DO NOT PACK THE WOUND.*** Secondary Dressing Optifoam Non-Adhesive Dressing, 4x4 in Discharge Instruction: Apply ONE LAYER OF FOAM D Woven Gauze Sponges 2x2 in Discharge Instruction: Apply over primary dressing as directed. Secured With Conforming Stretch Gauze Bandage, Sterile 2x75 (in/in) Discharge Instruction: Secure with stretch gauze as directed. 71M Medipore H Soft Cloth Surgical T ape, 4 x 10 (in/yd) Discharge Instruction: Secure with tape as directed.  Compression Wrap Compression Stockings Add-Ons Electronic Signature(s) Signed: 01/03/2023 9:59:16 AM By: Kalman Shan DO Entered By: Kalman Shan on 01/03/2023 09:29:04 -------------------------------------------------------------------------------- Multi-Disciplinary Care Plan Details Patient Name: Date of Service: [name], [name] 01/03/2023 8:45 A M Medical Record Number: [id number] Patient Account Number: [id number] Date of Birth/Sex: Treating RN: 08-31-1996 (27 y.o. Hessie Diener Primary Care Weyman Bogdon: Mina Marble Other Clinician: Referring Starsky Nanna: Treating Shelia Magallon/Extender: Olivia Mackie Weeks in Treatment: 1 Active Inactive Pain, Acute or Chronic Nursing Diagnoses: Pain, acute or chronic: actual or potential Potential alteration in  comfort, pain Goals: Patient will verbalize adequate pain control and receive pain control interventions during procedures as needed Date Initiated: 12/27/2022 Target Resolution Date: 02/01/2023 Goal Status: Active Patient/caregiver will verbalize comfort level met Date Initiated: 12/27/2022 Target Resolution Date: 01/31/2023 Goal Status: Active Interventions: Complete pain assessment as per visit requirements Encourage patient to take pain medications as prescribed Provide education on pain management Treatment Activities: Administer pain control measures as ordered : 12/27/2022 Notes: Wound/Skin Impairment Nursing Diagnoses: Knowledge deficit related to ulceration/compromised skin integrity OSAMAH, SCHMADER ([id number]) [id number] Page 7 of 12 Goals: Patient/caregiver will verbalize understanding of skin care regimen Date Initiated: 12/27/2022 Target Resolution Date: 01/31/2023 Goal Status: Active Interventions: Assess patient/caregiver ability to perform ulcer/skin care regimen upon admission and as needed Assess ulceration(s) every visit Provide education on ulcer and skin care Treatment Activities: Skin care regimen initiated : 12/27/2022 Topical wound management initiated : 12/27/2022 Notes: Electronic Signature(s) Signed: 01/03/2023 5:18:21 PM By: Deon Pilling RN, BSN Entered By: Deon Pilling on 01/03/2023 08:44:07 -------------------------------------------------------------------------------- Pain Assessment Details Patient Name: Date of Service: [name], [name] 01/03/2023 8:45 A M Medical Record Number: [id number] Patient Account Number: [id number] Date of Birth/Sex: Treating RN: Jul 11, 1996 (27 y.o. Hessie Diener Primary Care Bradshaw Minihan: Mina Marble Other Clinician: Referring Luretta Everly: Treating Durga Saldarriaga/Extender: Olivia Mackie Weeks in Treatment: 1 Active Problems Location of Pain Severity and Description of  Pain Patient Has Paino No Site Locations Rate the pain. Current Pain Level: 0 Pain Management and Medication Current Pain Management: Medication: No Cold Application: No Rest: No Massage: No Activity: No T.E.N.S.: No Heat Application: No Leg drop or elevation: No Is the Current Pain Management Adequate: Adequate How does your wound impact your activities of daily livingo Sleep: No Bathing: No Appetite: No Relationship With Others: No Bladder Continence: No Emotions: No Bowel Continence: No Work: No Toileting: No Drive: No [name], [name] ([id number]) [id number] Page 8 of 12 Dressing: No Hobbies: No Electronic Signature(s) Signed: 01/03/2023 5:18:21 PM By: Deon Pilling RN, BSN Entered By: Deon Pilling on 01/03/2023 08:35:16 -------------------------------------------------------------------------------- Patient/Caregiver Education Details Patient Name: Date of Service: [name] 1/11/2024andnbsp8:45 [place] Record Number: [id number] Patient Account Number: [id number] Date of Birth/Gender: Treating RN: 1996-09-24 (26 y.o. Hessie Diener Primary Care Physician: Mina Marble Other Clinician: Referring Physician: Treating Physician/Extender: Burns [place] in Treatment: 1 Education Assessment Education Provided To: Patient Education Topics Provided Wound/Skin Impairment: Handouts: Caring for Your Ulcer Methods: Explain/Verbal Responses: Reinforcements needed Electronic Signature(s) Signed: 01/03/2023 5:18:21 PM By: Deon Pilling RN, BSN Entered By: Deon Pilling on 01/03/2023 08:44:16 -------------------------------------------------------------------------------- Wound Assessment Details Patient Name: Date of Service: ZYIR, GASSERT 01/03/2023 8:45 A M Medical Record Number: [id number] Patient Account Number: [id number] Date of Birth/Sex: Treating RN: Oct 13, 1996 (27 y.o. Hessie Diener Primary Care Jameila Keeny: Mina Marble Other Clinician: Referring Melanie Openshaw: Treating Kory Panjwani/Extender: Olivia Mackie Weeks in Treatment: 1 Wound Status Wound Number: 1 Primary  Etiology: Diabetic Wound/Ulcer of the Lower Extremity Wound Location: Right Metatarsal head fifth Wound Status: Open Wounding Event: Gradually Appeared Comorbid History: Hypertension, Type I Diabetes, Neuropathy Date Acquired: 12/25/2019 Weeks Of Treatment: 1 Clustered Wound: No Pending Amputation On Presentation Photos [name], [name] ([id number]) [phone] Page 9 of 12 Wound Measurements Length: (cm) 0.2 Width: (cm) 0.2 Depth: (cm) 0.4 Area: (cm) 0.031 Volume: (cm) 0.013 % Reduction in Area: 56.3% % Reduction in Volume: 7.1% Epithelialization: Small (1-33%) Tunneling: No Undermining: Yes Starting Position (o'clock): 12 Ending Position (o'clock): 12 Maximum Distance: (cm) 0.4 Wound Description Classification: Grade 2 Wound Margin: Distinct, outline attached Exudate Amount: Medium Exudate Type: Serosanguineous Exudate Color: red, brown Foul Odor After Cleansing: No Slough/Fibrino No Wound Bed Granulation Amount: Large (67-100%) Exposed Structure Granulation Quality: Red Fascia Exposed: No Necrotic Amount: None Present (0%) Fat Layer (Subcutaneous Tissue) Exposed: Yes Tendon Exposed: No Muscle Exposed: No Joint Exposed: No Bone Exposed: No Periwound Skin Texture Texture Color No Abnormalities Noted: No No Abnormalities Noted: No Callus: Yes Atrophie Blanche: No Crepitus: No Cyanosis: No Excoriation: No Ecchymosis: No Induration: No Erythema: No Rash: No Hemosiderin Staining: No Scarring: No Mottled: No Pallor: No Moisture Rubor: No No Abnormalities Noted: No Dry / Scaly: No Maceration: No Treatment Notes Wound #1 (Metatarsal head fifth) Wound Laterality: Right Cleanser Wound Cleanser Discharge Instruction:  Cleanse the wound with wound cleanser prior to applying a clean dressing using gauze sponges, not tissue or cotton balls. Peri-Wound Care Skin Prep Discharge Instruction: Use skin prep as directed Topical Primary Dressing MediHoney Gel, tube 1.5 (oz) Discharge Instruction: Apply to wound bed as instructed Sorbalgon AG Dressing 2x2 (in/in) Discharge Instruction: ***APPLY OVER THE [place] AND DO NOT PACK THE WOUND.*** [name], [name] ([id number]) [id number] Page 10 of 12 Secondary Dressing Optifoam Non-Adhesive Dressing, 4x4 in Discharge Instruction: Apply ONE LAYER OF FOAM D Woven Gauze Sponges 2x2 in Discharge Instruction: Apply over primary dressing as directed. Secured With Conforming Stretch Gauze Bandage, Sterile 2x75 (in/in) Discharge Instruction: Secure with stretch gauze as directed. 35M Medipore H Soft Cloth Surgical T ape, 4 x 10 (in/yd) Discharge Instruction: Secure with tape as directed. Compression Wrap Compression Stockings Add-Ons Electronic Signature(s) Signed: 01/03/2023 5:18:21 PM By: Deon Pilling RN, BSN Entered By: Deon Pilling on 01/03/2023 08:40:53 -------------------------------------------------------------------------------- Wound Assessment Details Patient Name: Date of Service: [name], SCHLENDER 01/03/2023 8:45 A M Medical Record Number: [id number] Patient Account Number: [id number] Date of Birth/Sex: Treating RN: Oct 30, 1996 (27 y.o. Hessie Diener Primary Care Damariz Paganelli: Mina Marble Other Clinician: Referring Doninique Lwin: Treating Janei Scheff/Extender: Olivia Mackie Weeks in Treatment: 1 Wound Status Wound Number: 2 Primary Etiology: Diabetic Wound/Ulcer of the Lower Extremity Wound Location: Left Metatarsal head fifth Wound Status: Open Wounding Event: Gradually Appeared Comorbid History: Hypertension, Type I Diabetes, Neuropathy Date Acquired: 09/24/2022 Weeks Of Treatment: 1 Clustered Wound:  No Pending Amputation On Presentation Photos Wound Measurements Length: (cm) 0.2 Width: (cm) 0.4 Depth: (cm) 0.3 Area: (cm) 0.063 Volume: (cm) 0.019 % Reduction in Area: -34% % Reduction in Volume: -35.7% Epithelialization: Small (1-33%) Tunneling: No Undermining: No Wound Description Classification: Grade 2 Wound Margin: Distinct, outline attached Exudate Amount: Medium Exudate Type: Serosanguineous MAZIAH, KEELING E ([id number]) Exudate Color: red, brown Foul Odor After Cleansing: No Slough/Fibrino No [id number] Page 11 of 12 Wound Bed Granulation Amount: Large (67-100%) Exposed Structure Granulation Quality: Red Fascia Exposed: No Necrotic Amount: None Present (0%) Fat Layer (Subcutaneous Tissue) Exposed: Yes Tendon Exposed: No Muscle Exposed: No Joint Exposed: No Bone Exposed:  No Periwound Skin Texture Texture Color No Abnormalities Noted: No No Abnormalities Noted: No Callus: Yes Atrophie Blanche: No Crepitus: No Cyanosis: No Excoriation: No Ecchymosis: No Induration: No Erythema: No Rash: No Hemosiderin Staining: No Scarring: No Mottled: No Pallor: No Moisture Rubor: No No Abnormalities Noted: No Dry / Scaly: No Maceration: No Treatment Notes Wound #2 (Metatarsal head fifth) Wound Laterality: Left Cleanser Wound Cleanser Discharge Instruction: Cleanse the wound with wound cleanser prior to applying a clean dressing using gauze sponges, not tissue or cotton balls. Peri-Wound Care Skin Prep Discharge Instruction: Use skin prep as directed Topical Primary Dressing MediHoney Gel, tube 1.5 (oz) Discharge Instruction: Apply to wound bed as instructed [place] 2x2 (in/in) Discharge Instruction: ***APPLY OVER THE [place] AND DO NOT PACK THE WOUND.*** Secondary Dressing Optifoam Non-Adhesive Dressing, 4x4 in Discharge Instruction: Apply ONE LAYER OF FOAM D Woven Gauze Sponges 2x2 in Discharge  Instruction: Apply over primary dressing as directed. Secured With Conforming Stretch Gauze Bandage, Sterile 2x75 (in/in) Discharge Instruction: Secure with stretch gauze as directed. 80M Medipore H Soft Cloth Surgical T ape, 4 x 10 (in/yd) Discharge Instruction: Secure with tape as directed. Compression Wrap Compression Stockings Add-Ons Electronic Signature(s) Signed: 01/03/2023 5:18:21 PM By: Deon Pilling RN, BSN Entered By: Deon Pilling on 01/03/2023 08:41:24 [name] ([id number]) [id number] Page 12 of 12 -------------------------------------------------------------------------------- Vitals Details Patient Name: Date of Service: AKEEM, HEPPLER 01/03/2023 8:45 A M Medical Record Number: [id number] Patient Account Number: [id number] Date of Birth/Sex: Treating RN: September 15, 1996 (27 y.o. Hessie Diener Primary Care Annison Birchard: Mina Marble Other Clinician: Referring Nikolay Demetriou: Treating Nema Oatley/Extender: Olivia Mackie Weeks in Treatment: 1 Vital Signs Time Taken: 08:43 Temperature (F): 97.8 Pulse (bpm): 85 Respiratory Rate (breaths/min): 20 Blood Pressure (mmHg): 129/86 Capillary Blood Glucose (mg/dl): 80 Reference Range: 80 - 120 mg / dl Electronic Signature(s) Signed: 01/03/2023 5:18:21 PM By: Deon Pilling RN, BSN Entered By: Deon Pilling on 01/03/2023 08:43:37

## 2023-01-10 ENCOUNTER — Encounter (HOSPITAL_BASED_OUTPATIENT_CLINIC_OR_DEPARTMENT_OTHER): Payer: BC Managed Care – PPO | Admitting: Internal Medicine

## 2023-01-10 DIAGNOSIS — E10621 Type 1 diabetes mellitus with foot ulcer: Secondary | ICD-10-CM

## 2023-01-10 DIAGNOSIS — L97518 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ity: Secondary | ICD-10-CM

## 2023-01-10 DIAGNOSIS — M869 Osteomyelitis, unspecified: Secondary | ICD-10-CM | POA: Diagnosis not present

## 2023-01-10 DIAGNOSIS — L97528 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ty: Secondary | ICD-10-CM

## 2023-01-10 DIAGNOSIS — E1040 Type 1 diabetes mellitus with diabetic neuropathy, unspecified: Secondary | ICD-10-CM | POA: Diagnosis not present

## 2023-01-10 DIAGNOSIS — Z89421 Acquired absence of other right toe(s): Secondary | ICD-10-CM | POA: Diagnosis not present

## 2023-01-10 DIAGNOSIS — I10 Essential (primary) hypertension: Secondary | ICD-10-CM | POA: Diagnosis not present

## 2023-01-11 NOTE — Progress Notes (Signed)
[name], [name] ([id number]) [id number] Page 1 of 9 Visit Report for 01/10/2023 Chief Complaint Document Details Patient Name: Date of Service: [name], [name] 01/10/2023 10:30 A M Medical Record Number: [id number] Patient Account Number: [id number] Date of Birth/Sex: Treating RN: 01-30-1996 (27 y.o. M) Primary Care Provider: Mina Marble Other Clinician: Referring Provider: Treating Provider/Extender: Olivia Mackie Weeks in Treatment: 2 Information Obtained from: Patient Chief Complaint 12/27/2022; bilateral diabetic foot wounds Electronic Signature(s) Signed: 01/10/2023 12:16:58 PM By: Kalman Shan DO Entered By: Kalman Shan on 01/10/2023 12:05:40 -------------------------------------------------------------------------------- Debridement Details Patient Name: Date of Service: [name] 01/10/2023 10:30 A M Medical Record Number: [id number] Patient Account Number: [id number] Date of Birth/Sex: Treating RN: 07/09/1996 (27 y.o. Hessie Diener Primary Care Provider: Mina Marble Other Clinician: Referring Provider: Treating Provider/Extender: Olivia Mackie Weeks in Treatment: 2 Debridement Performed for Assessment: Wound #1 Right Metatarsal head fifth Performed By: Physician Kalman Shan, DO Debridement Type: Debridement Severity of Tissue Pre Debridement: Fat layer exposed Level of Consciousness (Pre-procedure): Awake and Alert Pre-procedure Verification/Time Out Yes - 10:35 Taken: Start Time: 10:36 Pain Control: Lidocaine 4% T opical Solution T Area Debrided (L x W): otal 1 (cm) x 1 (cm) = 1 (cm) Tissue and other material debrided: Viable, Non-Viable, Callus, Slough, Subcutaneous, Skin: Dermis , Skin: Epidermis, Slough Level: Skin/Subcutaneous Tissue Debridement Description: Excisional Instrument: Curette Bleeding: Minimum Hemostasis Achieved: Pressure End Time:  10:43 Procedural Pain: 0 Post Procedural Pain: 0 Response to Treatment: Procedure was tolerated well Level of Consciousness (Post- Awake and Alert procedure): Post Debridement Measurements of Total Wound Length: (cm) 0.2 Width: (cm) 0.2 Depth: (cm) 0.3 Volume: (cm) 0.009 Character of Wound/Ulcer Post Debridement: Improved Severity of Tissue Post Debridement: Fat layer exposed [name], [name] ([id number]) [id number] Page 2 of 9 Post Procedure Diagnosis Same as Pre-procedure Electronic Signature(s) Signed: 01/10/2023 12:16:58 PM By: Kalman Shan DO Signed: 01/10/2023 6:38:04 PM By: Deon Pilling RN, BSN Entered By: Deon Pilling on 01/10/2023 10:43:40 -------------------------------------------------------------------------------- Debridement Details Patient Name: Date of Service: [name], [name] 01/10/2023 10:30 A M Medical Record Number: [id number] Patient Account Number: [id number] Date of Birth/Sex: Treating RN: 1996-02-19 (27 y.o. Hessie Diener Primary Care Provider: Mina Marble Other Clinician: Referring Provider: Treating Provider/Extender: Olivia Mackie Weeks in Treatment: 2 Debridement Performed for Assessment: Wound #2 Left Metatarsal head fifth Performed By: Physician Kalman Shan, DO Debridement Type: Debridement Severity of Tissue Pre Debridement: Fat layer exposed Level of Consciousness (Pre-procedure): Awake and Alert Pre-procedure Verification/Time Out Yes - 10:35 Taken: Start Time: 10:36 Pain Control: Lidocaine 4% T opical Solution T Area Debrided (L x W): otal 0.5 (cm) x 1 (cm) = 0.5 (cm) Tissue and other material debrided: Viable, Non-Viable, Callus, Slough, Subcutaneous, Skin: Dermis , Skin: Epidermis, Slough Level: Skin/Subcutaneous Tissue Debridement Description: Excisional Instrument: Curette Bleeding: Minimum Hemostasis Achieved: Pressure End Time: 10:43 Procedural Pain: 0 Post  Procedural Pain: 0 Response to Treatment: Procedure was tolerated well Level of Consciousness (Post- Awake and Alert procedure): Post Debridement Measurements of Total Wound Length: (cm) 0.2 Width: (cm) 0.4 Depth: (cm) 0.3 Volume: (cm) 0.019 Character of Wound/Ulcer Post Debridement: Improved Severity of Tissue Post Debridement: Fat layer exposed Post Procedure Diagnosis Same as Pre-procedure Electronic Signature(s) Signed: 01/10/2023 12:16:58 PM By: Kalman Shan DO Signed: 01/10/2023 6:38:04 PM By: Deon Pilling RN, BSN Entered By: Deon Pilling on 01/10/2023 10:44:09 HPI Details -------------------------------------------------------------------------------- [name] ([id number]) [id number] Page 3 of 9 Patient Name: Date of Service: [name], [name] 01/10/2023 10:30  A M Medical Record Number: [id number] Patient Account Number: [id number] Date of Birth/Sex: Treating RN: 1996/10/22 (27 y.o. M) Primary Care Provider: Mina Marble Other Clinician: Referring Provider: Treating Provider/Extender: Olivia Mackie Weeks in Treatment: 2 History of Present Illness HPI Description: 12/27/2022 Mr. 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 He has been following with podiatry for this issue. He reports using Silvadene to the areas. Currently not using anything for offloading. He states that the right foot wound has been present for the past 1-1/2 years. He states that the left foot wound appeared about 3 months ago. He has not had recent imaging of the areas. Currently denies systemic signs of infection. 01/03/2023; patient presents for follow-up. He has been using Medihoney and silver alginate to the wound beds. He has been using his  surgical shoes with peg assist. He has not been scheduled for an MRI yet. There is been improvement in wound healing. He currently denies signs of infection. 1/18; patient presents for follow-up. He is scheduled to have his MRI on 1/29. He has been using silver alginate with Medihoney to the wound beds. He has been using his surgical shoes with peg assist. He has no issues or complaints today. He denies signs of infection. Electronic Signature(s) Signed: 01/10/2023 12:16:58 PM By: Kalman Shan DO Entered By: Kalman Shan on 01/10/2023 12:06:32 -------------------------------------------------------------------------------- Physical Exam Details Patient Name: Date of Service: JAYSIAH, MARCHETTA 01/10/2023 10:30 A M Medical Record Number: [id number] Patient Account Number: [id number] Date of Birth/Sex: Treating RN: 07-01-1996 (27 y.o. M) Primary Care Provider: Mina Marble Other Clinician: Referring Provider: Treating Provider/Extender: Olivia Mackie Weeks in Treatment: 2 Constitutional respirations regular, non-labored and within target range for patient.. Cardiovascular 2+ dorsalis pedis/posterior tibialis pulses. Psychiatric pleasant and cooperative. Notes Bilateral fifth met head wounds to the feet. Granulation tissue With nonviable tissue and callus. Minimal undermining to the wound beds. No signs of infection including increased warmth, erythema or purulent drainage. Electronic Signature(s) Signed: 01/10/2023 12:16:58 PM By: Kalman Shan DO Entered By: Kalman Shan on 01/10/2023 12:07:07 -------------------------------------------------------------------------------- Physician Orders Details Patient Name: Date of Service: [name], [name] 01/10/2023 10:30 A M Medical Record Number: [id number] Patient Account Number: [id number] Date of Birth/Sex: Treating RN: 09/02/1996 (27 y.o. Hessie Diener Primary Care Provider: Mina Marble Other  Clinician: Referring Provider: Treating Provider/Extender: [name], [name] ([id number]) [id number] Page 4 of 9 Weeks in Treatment: 2 Verbal / Phone Orders: No Diagnosis Coding ICD-10 Coding Code Description L97.518 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ity L97.528 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ty E10.40 Type 1 diabetes mellitus with diabetic neuropathy, unspecified E10.621 Type 1 diabetes mellitus with foot ulcer Follow-up Appointments ppointment in 1 week. - Thursday with Dr. Heber [place] on 01/17/2023 1030 Return A ppointment in 2 weeks. - Thursday with Dr. Heber [place] 01/24/2023 1015 Return A Other: - MRI 01/21/2023 ****REMEMBER TO REMOVE THE SILVER AGLINATE, [place], COVER WITH BANDAGE BEFORE MRI.**** Anesthetic (In clinic) Topical Lidocaine 4% applied to wound bed Bathing/ Shower/ Hygiene May shower with protection but do not get wound dressing(s) wet. Protect dressing(s) with water repellant cover (for example, large plastic bag) or a cast cover and may then take shower. Off-Loading Open toe surgical shoe  to: - both surgical shoes with PEG ASSIST to both shoes. Wear while walking and standing. DO not walk barefoot or just in socks. Additional Orders / Instructions Follow Nutritious Diet - CLOSELY MONITOR BLOOD SUGAR. Wound Treatment Wound #1 - Metatarsal head fifth Wound Laterality: Right Cleanser: Wound Cleanser (Generic) 1 x Per Day/30 Days Discharge Instructions: Cleanse the wound with wound cleanser prior to applying a clean dressing using gauze sponges, not tissue or cotton balls. Peri-Wound Care: Skin Prep (Generic) 1 x Per Day/30 Days Discharge Instructions: Use skin prep as directed Prim Dressing: MediHoney Gel, tube 1.5 (oz) 1 x Per Day/30 Days ary Discharge Instructions: Apply to wound bed as instructed Prim  Dressing: Sorbalgon AG Dressing 2x2 (in/in) (Generic) 1 x Per Day/30 Days ary Discharge Instructions: ***APPLY OVER THE MEDIHONEY AND DO NOT PACK THE WOUND.*** Secondary Dressing: Optifoam Non-Adhesive Dressing, 4x4 in (Generic) 1 x Per Day/30 Days Discharge Instructions: Apply ONE LAYER OF FOAM D Secondary Dressing: Woven Gauze Sponges 2x2 in (Generic) 1 x Per Day/30 Days Discharge Instructions: Apply over primary dressing as directed. Secured With: Child psychotherapist, Sterile 2x75 (in/in) (Generic) 1 x Per Day/30 Days Discharge Instructions: Secure with stretch gauze as directed. Secured With: 40M Medipore H Soft Cloth Surgical T ape, 4 x 10 (in/yd) (Generic) 1 x Per Day/30 Days Discharge Instructions: Secure with tape as directed. Wound #2 - Metatarsal head fifth Wound Laterality: Left Cleanser: Wound Cleanser (Generic) 1 x Per Day/30 Days Discharge Instructions: Cleanse the wound with wound cleanser prior to applying a clean dressing using gauze sponges, not tissue or cotton balls. Peri-Wound Care: Skin Prep (Generic) 1 x Per Day/30 Days Discharge Instructions: Use skin prep as directed Prim Dressing: MediHoney Gel, tube 1.5 (oz) 1 x Per Day/30 Days ary Discharge Instructions: Apply to wound bed as instructed Prim Dressing: Sorbalgon AG Dressing 2x2 (in/in) (Generic) 1 x Per Day/30 Days ary Discharge Instructions: ***APPLY OVER THE MEDIHONEY AND DO NOT PACK THE WOUND.*** Secondary Dressing: Optifoam Non-Adhesive Dressing, 4x4 in (Generic) 1 x Per Day/30 Days Discharge Instructions: Apply ONE LAYER OF FOAM D Secondary Dressing: Woven Gauze Sponges 2x2 in (Generic) 1 x Per Day/30 Days Discharge Instructions: Apply over primary dressing as directed. [name], [name] ([id number]) [id number] Page 5 of 9 Secured With: Child psychotherapist, Sterile 2x75 (in/in) (Generic) 1 x Per Day/30 Days Discharge Instructions: Secure with stretch  gauze as directed. Secured With: 40M Medipore H Soft Cloth Surgical T ape, 4 x 10 (in/yd) (Generic) 1 x Per Day/30 Days Discharge Instructions: Secure with tape as directed. Electronic Signature(s) Signed: 01/10/2023 12:16:58 PM By: Kalman Shan DO Entered By: Kalman Shan on 01/10/2023 12:08:25 -------------------------------------------------------------------------------- Problem List Details Patient Name: Date of Service: [name], [name] 01/10/2023 10:30 A M Medical Record Number: [id number] Patient Account Number: [id number] Date of Birth/Sex: Treating RN: 11/24/1996 (27 y.o. Hessie Diener Primary Care Provider: Mina Marble Other Clinician: Referring Provider: Treating Provider/Extender: Olivia Mackie Weeks in Treatment: 2 Active Problems ICD-10 Encounter Code Description Active Date MDM Diagnosis L97.518 Non-pressure chronic ulcer of other part of right foot with other specified 12/27/2022 No Yes severity L97.528 Non-pressure chronic ulcer of other part of left foot with other specified 12/27/2022 No Yes severity E10.40 Type 1 diabetes mellitus with diabetic neuropathy, unspecified 12/27/2022 No Yes E10.621 Type 1 diabetes mellitus with foot ulcer 12/27/2022 No Yes Inactive Problems Resolved Problems Electronic Signature(s) Signed: 01/10/2023 12:16:58 PM By: Kalman Shan DO Entered By: Kalman Shan on 01/10/2023 12:05:26 --------------------------------------------------------------------------------  Progress Note Details Patient Name: Date of Service: [name], [name] 01/10/2023 10:30 A M Medical Record Number: [id number] Patient Account Number: [id number] Date of Birth/Sex: Treating RN: Jan 19, 1996 (27 y.o. [name], [name] ([id number]) [id number] Page 6 of 9 Primary Care Provider: Mina Marble Other Clinician: Referring Provider: Treating Provider/Extender: Olivia Mackie Weeks in Treatment: 2 Subjective Chief Complaint Information obtained from Patient 12/27/2022; bilateral diabetic foot wounds History of Present Illness (HPI) 12/27/2022 Mr. 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 He has been following with podiatry for this issue. He reports using Silvadene to the areas. Currently not using anything for offloading. He states that the right foot wound has been present for the past 1-1/2 years. He states that the left foot wound appeared about 3 months ago. He has not had recent imaging of the areas. Currently denies systemic signs of infection. 01/03/2023; patient presents for follow-up. He has been using Medihoney and silver alginate to the wound beds. He has been using his surgical shoes with peg assist. He has not been scheduled for an MRI yet. There is been improvement in wound healing. He currently denies signs of infection. 1/18; patient presents for follow-up. He is scheduled to have his MRI on 1/29. He has been using silver alginate with Medihoney to the wound beds. He has been using his surgical shoes with peg assist. He has no issues or complaints today. He denies signs of infection. Patient History Information obtained from Patient, Chart. Family History Unknown History. Social History Never smoker, Marital Status - Single, Alcohol Use - Never, Drug Use - No History, Caffeine Use - Rarely. Medical History Cardiovascular Patient has history of Hypertension Endocrine Patient has history of Type I Diabetes Denies history of Type II Diabetes Neurologic Patient has history of Neuropathy Hospitalization/Surgery History - R foot wound debridement and closure. 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 Medical A Surgical History Notes nd Cardiovascular heart  murmur Objective Constitutional respirations regular, non-labored and within target range for patient.. Vitals Time Taken: 9:46 AM, Temperature: 98.7 F, Pulse: 74 bpm, Respiratory Rate: 17 breaths/min, Blood Pressure: 111/74 mmHg, Capillary Blood Glucose: 120 mg/dl. Cardiovascular 2+ dorsalis pedis/posterior tibialis pulses. Psychiatric pleasant and cooperative. General Notes: Bilateral fifth met head wounds to the feet. Granulation tissue With nonviable tissue and callus. Minimal undermining to the wound beds. No signs of infection including increased warmth, erythema or purulent drainage. Integumentary (Hair, Skin) Wound #1 status is Open. Original cause of wound was Gradually Appeared. The date acquired was: 12/25/2019. The wound has been in treatment 2 weeks. The wound is located on the Right Metatarsal head fifth. The wound measures 0.2cm length x 0.2cm width x 0.3cm depth; 0.031cm^2 area and 0.009cm^3 volume. There is Fat Layer (Subcutaneous Tissue) exposed. There is no tunneling noted, however, there is undermining starting at 12:00 and ending at 12:00 with a maximum distance of 0.3cm. There is a medium amount of serosanguineous drainage noted. The wound margin is distinct with the outline attached to the wound base. There is large (67-100%) red granulation within the wound bed. There is no necrotic tissue within the wound bed. The periwound skin appearance exhibited: Callus. The periwound skin appearance did not exhibit: Crepitus, Excoriation, Induration, Rash, Scarring, Dry/Scaly, Maceration, Atrophie Blanche, Cyanosis, Ecchymosis, Hemosiderin Staining, Mottled, Pallor, Rubor, Erythema. Wound #2 status is  Open. Original cause of wound was Gradually Appeared. The date acquired was: 09/24/2022. The wound has been in treatment 2 weeks. The wound is located on the Left Metatarsal head fifth. The wound measures 0.2cm length x 0.4cm width x 0.3cm depth; 0.063cm^2 area and 0.019cm^3  volume. [name], KROTZ ([id number]) [id number] Page 7 of 9 There is Fat Layer (Subcutaneous Tissue) exposed. There is no tunneling noted, however, there is undermining starting at 12:00 and ending at 12:00 with a maximum distance of 0.3cm. There is a medium amount of serosanguineous drainage noted. The wound margin is distinct with the outline attached to the wound base. There is large (67-100%) red granulation within the wound bed. There is no necrotic tissue within the wound bed. The periwound skin appearance exhibited: Callus. The periwound skin appearance did not exhibit: Crepitus, Excoriation, Induration, Rash, Scarring, Dry/Scaly, Maceration, Atrophie Blanche, Cyanosis, Ecchymosis, Hemosiderin Staining, Mottled, Pallor, Rubor, Erythema. 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 I debrided nonviable tissue. I recommended continue with Medihoney and silver alginate. I recommended he take the silver alginate off before getting an MRI on 1/29. For now continue aggressive offloading with peg assist in surgical shoe. If no concerning features on MRI suggesting osteo plan is for the total contact cast. Follow-up in 1 week. Procedures Wound #1 Pre-procedure diagnosis of Wound #1 is a Diabetic Wound/Ulcer of the Lower Extremity located on the Right Metatarsal head fifth .Severity of Tissue Pre Debridement is: Fat layer exposed. There was a Excisional Skin/Subcutaneous Tissue Debridement with a total area of 1 sq cm performed by Kalman Shan, DO. With the following instrument(s): Curette to remove Viable and Non-Viable tissue/material. Material removed includes Callus,  Subcutaneous Tissue, Slough, Skin: Dermis, and Skin: Epidermis after achieving pain control using Lidocaine 4% Topical Solution. A time out was conducted at 10:35, prior to the start of the procedure. A Minimum amount of bleeding was controlled with Pressure. The procedure was tolerated well with a pain level of 0 throughout and a pain level of 0 following the procedure. Post Debridement Measurements: 0.2cm length x 0.2cm width x 0.3cm depth; 0.009cm^3 volume. Character of Wound/Ulcer Post Debridement is improved. Severity of Tissue Post Debridement is: Fat layer exposed. Post procedure Diagnosis Wound #1: Same as Pre-Procedure Wound #2 Pre-procedure diagnosis of Wound #2 is a Diabetic Wound/Ulcer of the Lower Extremity located on the Left Metatarsal head fifth .Severity of Tissue Pre Debridement is: Fat layer exposed. There was a Excisional Skin/Subcutaneous Tissue Debridement with a total area of 0.5 sq cm performed by Kalman Shan, DO. With the following instrument(s): Curette to remove Viable and Non-Viable tissue/material. Material removed includes Callus, Subcutaneous Tissue, Slough, Skin: Dermis, and Skin: Epidermis after achieving pain control using Lidocaine 4% Topical Solution. A time out was conducted at 10:35, prior to the start of the procedure. A Minimum amount of bleeding was controlled with Pressure. The procedure was tolerated well with a pain level of 0 throughout and a pain level of 0 following the procedure. Post Debridement Measurements: 0.2cm length x 0.4cm width x 0.3cm depth; 0.019cm^3 volume. Character of Wound/Ulcer Post Debridement is improved. Severity of Tissue Post Debridement is: Fat layer exposed. Post procedure Diagnosis Wound #2: Same as Pre-Procedure Plan Follow-up Appointments: Return Appointment in 1 week. -  Thursday with Dr. Heber [place] on 01/17/2023 1030 Return Appointment in 2 weeks. - Thursday with Dr. Heber [place] 01/24/2023 1015 Other: - MRI 01/21/2023  ****REMEMBER TO REMOVE THE SILVER AGLINATE, [place] WOUNDS, COVER WITH BANDAGE BEFORE MRI.**** Anesthetic: (In clinic) Topical Lidocaine 4% applied to wound bed Bathing/ Shower/ Hygiene: May shower with protection but do not get wound dressing(s) wet. Protect dressing(s) with water repellant cover (for example, large plastic bag) or a cast cover and may then take shower. Off-Loading: Open toe surgical shoe to: - both surgical shoes with PEG ASSIST to both shoes. Wear while walking and standing. DO not walk barefoot or just in socks. Additional Orders / Instructions: Follow Nutritious Diet - CLOSELY MONITOR BLOOD SUGAR. WOUND #1: - Metatarsal head fifth Wound Laterality: Right Cleanser: Wound Cleanser (Generic) 1 x Per Day/30 Days Discharge Instructions: Cleanse the wound with wound cleanser prior to applying a clean dressing using gauze sponges, not tissue or cotton balls. Peri-Wound Care: Skin Prep (Generic) 1 x Per Day/30 Days Discharge Instructions: Use skin prep as directed Prim Dressing: MediHoney Gel, tube 1.5 (oz) 1 x Per Day/30 Days ary Discharge Instructions: Apply to wound bed as instructed Prim Dressing: Sorbalgon AG Dressing 2x2 (in/in) (Generic) 1 x Per Day/30 Days ary Discharge Instructions: ***APPLY OVER THE MEDIHONEY AND DO NOT PACK THE WOUND.*** Secondary Dressing: Optifoam Non-Adhesive Dressing, 4x4 in (Generic) 1 x Per Day/30 Days Discharge Instructions: Apply ONE LAYER OF FOAM D Secondary Dressing: Woven Gauze Sponges 2x2 in (Generic) 1 x Per Day/30 Days Discharge Instructions: Apply over primary dressing as directed. Secured With: Child psychotherapist, Sterile 2x75 (in/in) (Generic) 1 x Per Day/30 Days Discharge Instructions: Secure with stretch gauze as directed. Secured With: 19M Medipore H Soft Cloth Surgical T ape, 4 x 10 (in/yd) (Generic) 1 x Per Day/30 Days BERYL, BALZ ([id number]) [id number] Page 8 of 9 Discharge  Instructions: Secure with tape as directed. WOUND #2: - Metatarsal head fifth Wound Laterality: Left Cleanser: Wound Cleanser (Generic) 1 x Per Day/30 Days Discharge Instructions: Cleanse the wound with wound cleanser prior to applying a clean dressing using gauze sponges, not tissue or cotton balls. Peri-Wound Care: Skin Prep (Generic) 1 x Per Day/30 Days Discharge Instructions: Use skin prep as directed Prim Dressing: MediHoney Gel, tube 1.5 (oz) 1 x Per Day/30 Days ary Discharge Instructions: Apply to wound bed as instructed Prim Dressing: Sorbalgon AG Dressing 2x2 (in/in) (Generic) 1 x Per Day/30 Days ary Discharge Instructions: ***APPLY OVER THE MEDIHONEY AND DO NOT PACK THE WOUND.*** Secondary Dressing: Optifoam Non-Adhesive Dressing, 4x4 in (Generic) 1 x Per Day/30 Days Discharge Instructions: Apply ONE LAYER OF FOAM D Secondary Dressing: Woven Gauze Sponges 2x2 in (Generic) 1 x Per Day/30 Days Discharge Instructions: Apply over primary dressing as directed. Secured With: Child psychotherapist, Sterile 2x75 (in/in) (Generic) 1 x Per Day/30 Days Discharge Instructions: Secure with stretch gauze as directed. Secured With: 19M Medipore H Soft Cloth Surgical T ape, 4 x 10 (in/yd) (Generic) 1 x Per Day/30 Days Discharge Instructions: Secure with tape as directed. 1. In office sharp debridement 2. Medihoney in silver alginate 3. Aggressive offloadingoosurgical shoe and peg assist 4. Follow-up in 1 week Electronic Signature(s) Signed: 01/10/2023 12:16:58 PM By: Kalman Shan DO Entered By: Kalman Shan on 01/10/2023 12:09:36 -------------------------------------------------------------------------------- HxROS Details Patient Name: Date of Service: DERELLE, COCKRELL 01/10/2023 10:30 A M Medical Record Number: [id number] Patient Account Number: [id number] Date of Birth/Sex: Treating RN: 1996-02-23 (27 y.o. M) Primary Care Provider: Mina Marble  Other  Clinician: Referring Provider: Treating Provider/Extender: Olivia Mackie Weeks in Treatment: 2 Information Obtained From Patient Chart Cardiovascular Medical History: Positive for: Hypertension Past Medical History Notes: heart murmur Endocrine Medical History: Positive for: Type I Diabetes Negative for: Type II Diabetes Time with diabetes: 1 year ago Treated with: Insulin, Diet Blood sugar tested every day: Yes Tested : TID Neurologic Medical History: Positive for: Neuropathy Immunizations Pneumococcal Vaccine: Received Pneumococcal Vaccination: No Implantable Devices None CARRON, JAGGI ([id number]) [id number] Page 9 of 9 Hospitalization / Surgery History Type of Hospitalization/Surgery 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 Family and Social History Unknown History: Yes; Never smoker; Marital Status - Single; Alcohol Use: Never; Drug Use: No History; Caffeine Use: Rarely; Financial Concerns: No; Food, Clothing or Shelter Needs: No; Support System Lacking: No; Transportation Concerns: No Electronic Signature(s) Signed: 01/10/2023 12:16:58 PM By: Kalman Shan DO Entered By: Kalman Shan on 01/10/2023 12:06:37 -------------------------------------------------------------------------------- SuperBill Details Patient Name: Date of Service: [name] 01/10/2023 Medical Record Number: [id number] Patient Account Number: [id number] Date of Birth/Sex: Treating RN: [birth year] (27 y.o. 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 E10.40 Type 1  diabetes mellitus with diabetic neuropathy, unspecified E10.621 Type 1 diabetes mellitus with foot ulcer Facility Procedures : CPT4 Code: 83818403 Description: 75436 - DEB SUBQ TISSUE 20 SQ CM/< ICD-10 Diagnosis Description L97.518 Non-pressure chronic ulcer of other part of right foot with other specified sev L97.528 Non-pressure chronic ulcer of other part of left foot with other specified seve  E10.621 Type 1 diabetes mellitus with foot ulcer Modifier: erity rity Quantity: 1 Physician Procedures : CPT4 Code Description Modifier [id number] 03524 - WC PHYS SUBQ TISS 20 SQ CM ICD-10 Diagnosis Description L97.518 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ity L97.528 Non-pressure chronic ulcer of other part of left  foot with other specified severity E10.621 Type 1 diabetes mellitus with foot ulcer Quantity: 1 Electronic Signature(s) Signed: 01/10/2023 12:16:58 PM By: Kalman Shan DO Entered By: Kalman Shan on 01/10/2023 12:09:52

## 2023-01-14 ENCOUNTER — Telehealth: Payer: Self-pay

## 2023-01-14 DIAGNOSIS — E1142 Type 2 diabetes mellitus with diabetic polyneuropathy: Secondary | ICD-10-CM

## 2023-01-14 NOTE — Telephone Encounter (Signed)
Trulicity is out of stock. Alternatives?

## 2023-01-15 MED ORDER — SEMAGLUTIDE (1 MG/DOSE) 4 MG/3ML ~~LOC~~ SOPN: 1.0000 mg | PEN_INJECTOR | SUBCUTANEOUS | 2 refills | Status: DC

## 2023-01-15 NOTE — Telephone Encounter (Signed)
Lvm for pt advising Ozempic rx sent to preferred pharmacy.

## 2023-01-15 NOTE — Telephone Encounter (Signed)
Can we try to go back to Ozempic? - let's try to send the 1 mg dose to his pharmacy. Ty! C

## 2023-01-16 NOTE — Progress Notes (Signed)
DEMAURION, DICIOCCIO ([id number]) [id number] Page 1 of 9 Visit Report for 01/10/2023 Arrival Information Details Patient Name: Date of Service: [name], [name] 01/10/2023 10:30 A M Medical Record Number: [id number] Patient Account Number: [id number] Date of Birth/Sex: Treating RN: 1996-03-24 (27 y.o. Burnadette Pop, Lauren Primary Care Myran Arcia: Mina Marble Other Clinician: Referring Azure Budnick: Treating Omie Ferger/Extender: Olivia Mackie Weeks in Treatment: 2 Visit Information History Since Last Visit Added or deleted any medications: No Patient Arrived: Ambulatory Any new allergies or adverse reactions: No Arrival Time: 09:43 Had a fall or experienced change in No Accompanied By: self activities of daily living that may affect Transfer Assistance: None risk of falls: Patient Identification Verified: Yes Signs or symptoms of abuse/neglect since last visito No Secondary Verification Process Completed: Yes Hospitalized since last visit: No Patient Requires Transmission-Based Precautions: Yes Implantable device outside of the clinic excluding No Transmission-Based Precautions: Contact MRSA cellular tissue based products placed in the center Patient Has Alerts: No since last visit: Has Dressing in Place as Prescribed: Yes Pain Present Now: No Electronic Signature(s) Signed: 01/16/2023 4:09:44 PM By: Rhae Hammock RN Entered By: Rhae Hammock on 01/10/2023 09:45:00 -------------------------------------------------------------------------------- Encounter Discharge Information Details Patient Name: Date of Service: [name]. 01/10/2023 10:30 A M Medical Record Number: [id number] Patient Account Number: [id number] Date of Birth/Sex: Treating RN: November 20, 1996 (27 y.o. Hessie Diener Primary Care Raeley Gilmore: Mina Marble Other Clinician: Referring Davison Ohms: Treating Barnard Sharps/Extender: Olivia Mackie Weeks in Treatment: 2 Encounter Discharge Information Items Post Procedure Vitals Discharge Condition: Stable Temperature (F): 98.7 Ambulatory Status: Ambulatory Pulse (bpm): 74 Discharge Destination: Home Respiratory Rate (breaths/min): 17 Transportation: Private Auto Blood Pressure (mmHg): 111/74 Accompanied By: self Schedule Follow-up Appointment: Yes Clinical Summary of Care: Electronic Signature(s) Signed: 01/10/2023 6:38:04 PM By: Deon Pilling RN, BSN Entered By: Deon Pilling on 01/10/2023 18:05:20 [name] ([id number]) [id number] Page 2 of 9 -------------------------------------------------------------------------------- Lower Extremity Assessment Details Patient Name: Date of Service: [name], [name] 01/10/2023 10:30 A M Medical Record Number: [id number] Patient Account Number: [id number] Date of Birth/Sex: Treating RN: August 22, 1996 (27 y.o. Burnadette Pop, Lauren Primary Care Aireonna Bauer: Mina Marble Other Clinician: Referring Elhadji Pecore: Treating Emmamarie Kluender/Extender: Olivia Mackie Weeks in Treatment: 2 Edema Assessment Assessed: [Left: No] [Right: No] Edema: [Left: No] [Right: No] Calf Left: Right: Point of Measurement: 34 cm From Medial Instep 36 cm 35 cm Ankle Left: Right: Point of Measurement: 9 cm From Medial Instep 22 cm 21 cm Electronic Signature(s) Signed: 01/16/2023 4:09:44 PM By: Rhae Hammock RN Entered By: Rhae Hammock on 01/10/2023 09:51:25 -------------------------------------------------------------------------------- Multi Wound Chart Details Patient Name: Date of Service: [name]. 01/10/2023 10:30 A M Medical Record Number: [id number] Patient Account Number: [id number] Date of Birth/Sex: Treating RN: 29-Sep-1996 (27 y.o. M) Primary Care Samba Cumba: Mina Marble Other Clinician: Referring Tristian Sickinger: Treating Rosabella Edgin/Extender: Olivia Mackie Weeks in Treatment: 2 Vital Signs Height(in): Capillary Blood Glucose(mg/dl): 120 Weight(lbs): Pulse(bpm): 74 Body Mass Index(BMI): Blood Pressure(mmHg): 111/74 Temperature(F): 98.7 Respiratory Rate(breaths/min): 17 [1:Photos:] [N/A:N/A] Right Metatarsal head fifth Left Metatarsal head fifth N/A Wound Location: Gradually Appeared Gradually Appeared N/A Wounding Event: Diabetic Wound/Ulcer of the Lower Diabetic Wound/Ulcer of the Lower N/A Primary Etiology: Extremity Extremity Hypertension, Type I Diabetes, Hypertension, Type I Diabetes, N/A Comorbid History: Neuropathy Neuropathy [name], [name] ([id number]) [id number] Page 3 of 9 12/25/2019 09/24/2022 N/A Date Acquired: 2 2 N/A Weeks of Treatment: Open Open N/A Wound Status: No No N/A Wound Recurrence: Yes Yes N/A Pending A mputation on Presentation:  0.2x0.2x0.3 0.2x0.4x0.3 N/A Measurements Lucas x W x [name] (cm) 0.031 0.063 N/A A (cm) : rea 0.009 0.019 N/A Volume (cm) : 56.30% -34.00% N/A % Reduction in A rea: 35.70% -35.70% N/A % Reduction in Volume: 12 12 Starting Position 1 (o'clock): 12 12 Ending Position 1 (o'clock): 0.3 0.3 Maximum Distance 1 (cm): Yes Yes N/A Undermining: Grade 2 Grade 2 N/A Classification: Medium Medium N/A Exudate A mount: Serosanguineous Serosanguineous N/A Exudate Type: red, brown red, brown N/A Exudate Color: Distinct, outline attached Distinct, outline attached N/A Wound Margin: Large (67-100%) Large (67-100%) N/A Granulation A mount: Red Red N/A Granulation Quality: None Present (0%) None Present (0%) N/A Necrotic A mount: Fat Layer (Subcutaneous Tissue): Yes Fat Layer (Subcutaneous Tissue): Yes N/A Exposed Structures: Fascia: No Fascia: No Tendon: No Tendon: No Muscle: No Muscle: No Joint: No Joint: No Bone: No Bone: No Small (1-33%) Small (1-33%) N/A Epithelialization: Debridement - Excisional Debridement - Excisional  N/A Debridement: Pre-procedure Verification/Time Out 10:35 10:35 N/A Taken: Lidocaine 4% Topical Solution Lidocaine 4% Topical Solution N/A Pain Control: Callus, Subcutaneous, Slough Callus, Subcutaneous, Slough N/A Tissue Debrided: Skin/Subcutaneous Tissue Skin/Subcutaneous Tissue N/A Level: 1 0.5 N/A Debridement A (sq cm): rea Curette Curette N/A Instrument: Minimum Minimum N/A Bleeding: Pressure Pressure N/A Hemostasis A chieved: 0 0 N/A Procedural Pain: 0 0 N/A Post Procedural Pain: Procedure was tolerated well Procedure was tolerated well N/A Debridement Treatment Response: 0.2x0.2x0.3 0.2x0.4x0.3 N/A Post Debridement Measurements Lucas x W x [name] (cm) 0.009 0.019 N/A Post Debridement Volume: (cm) Callus: Yes Callus: Yes N/A Periwound Skin Texture: Excoriation: No Excoriation: No Induration: No Induration: No Crepitus: No Crepitus: No Rash: No Rash: No Scarring: No Scarring: No Maceration: No Maceration: No N/A Periwound Skin Moisture: Dry/Scaly: No Dry/Scaly: No Atrophie Blanche: No Atrophie Blanche: No N/A Periwound Skin Color: Cyanosis: No Cyanosis: No Ecchymosis: No Ecchymosis: No Erythema: No Erythema: No Hemosiderin Staining: No Hemosiderin Staining: No Mottled: No Mottled: No Pallor: No Pallor: No Rubor: No Rubor: No Debridement Debridement N/A Procedures Performed: Treatment Notes Electronic Signature(s) Signed: 01/10/2023 12:16:58 PM By: Kalman Shan DO Entered By: Kalman Shan on 01/10/2023 12:05:31 -------------------------------------------------------------------------------- Multi-Disciplinary Care Plan Details Patient Name: Date of Service: [name]. 01/10/2023 10:30 A M Medical Record Number: [id number] Patient Account Number: [id number] [name], [name] ([id number]) [id number] Page 4 of 9 Date of Birth/Sex: Treating RN: [birth year] (27 y.o. Hessie Diener Primary Care Josten Warmuth:  Mina Marble Other Clinician: Referring Sybella Harnish: Treating Jowan Skillin/Extender: Olivia Mackie Weeks in Treatment: 2 Active Inactive Pain, Acute or Chronic Nursing Diagnoses: Pain, acute or chronic: actual or potential Potential alteration in comfort, pain Goals: Patient will verbalize adequate pain control and receive pain control interventions during procedures as needed Date Initiated: 12/27/2022 Target Resolution Date: 02/01/2023 Goal Status: Active Patient/caregiver will verbalize comfort level met Date Initiated: 12/27/2022 Target Resolution Date: 01/31/2023 Goal Status: Active Interventions: Complete pain assessment as per visit requirements Encourage patient to take pain medications as prescribed Provide education on pain management Treatment Activities: Administer pain control measures as ordered : 12/27/2022 Notes: Wound/Skin Impairment Nursing Diagnoses: Knowledge deficit related to ulceration/compromised skin integrity Goals: Patient/caregiver will verbalize understanding of skin care regimen Date Initiated: 12/27/2022 Target Resolution Date: 01/31/2023 Goal Status: Active Interventions: Assess patient/caregiver ability to perform ulcer/skin care regimen upon admission and as needed Assess ulceration(s) every visit Provide education on ulcer and skin care Treatment Activities: Skin care regimen initiated : 12/27/2022 Topical wound management initiated : 12/27/2022 Notes: Electronic Signature(s) Signed: 01/10/2023 6:38:04 PM  By: Deon Pilling RN, BSN Entered By: Deon Pilling on 01/10/2023 18:04:04 -------------------------------------------------------------------------------- Pain Assessment Details Patient Name: Date of Service: [name], [name] 01/10/2023 10:30 A M Medical Record Number: [id number] Patient Account Number: [id number] Date of Birth/Sex: Treating RN: [birth year] (27 y.o. Burnadette Pop, Lauren Primary Care Basheer Molchan: Mina Marble  Other Clinician: Referring Raeghan Demeter: Treating Danzell Birky/Extender: Olivia Mackie Weeks in Treatment: 2 [address] [name], [name] ([id number]) [id number] Page 5 of 9 Location of Pain Severity and Description of Pain Patient Has Paino No Site Locations Pain Management and Medication Current Pain Management: Electronic Signature(s) Signed: 01/16/2023 4:09:44 PM By: Rhae Hammock RN Entered By: Rhae Hammock on 01/10/2023 09:45:09 -------------------------------------------------------------------------------- Patient/Caregiver Education Details Patient Name: Date of Service: [name] 1/18/2024andnbsp10:30 [place] Record Number: [id number] Patient Account Number: [id number] Date of Birth/Gender: Treating RN: Jul 09, 1996 (27 y.o. Hessie Diener Primary Care Physician: Mina Marble Other Clinician: Referring Physician: Treating Physician/Extender: Burns Spain in Treatment: 2 Education Assessment Education Provided To: Patient Education Topics Provided Wound/Skin Impairment: Handouts: Caring for Your Ulcer Methods: Explain/Verbal Responses: Reinforcements needed Electronic Signature(s) Signed: 01/10/2023 6:38:04 PM By: Deon Pilling RN, BSN Entered By: Deon Pilling on 01/10/2023 18:04:27 Wound Assessment Details -------------------------------------------------------------------------------- [name] ([id number]) [id number] Page 6 of 9 Patient Name: Date of Service: [name], [name] 01/10/2023 10:30 A M Medical Record Number: [id number] Patient Account Number: [id number] Date of Birth/Sex: Treating RN: 1996-03-07 (27 y.o. Burnadette Pop, Lauren Primary Care Ibtisam Benge: Mina Marble Other Clinician: Referring Nickol Collister: Treating Takaya Hyslop/Extender: Olivia Mackie Weeks in Treatment: 2 Wound Status Wound Number:  1 Primary Etiology: Diabetic Wound/Ulcer of the Lower Extremity Wound Location: Right Metatarsal head fifth Wound Status: Open Wounding Event: Gradually Appeared Comorbid History: Hypertension, Type I Diabetes, Neuropathy Date Acquired: 12/25/2019 Weeks Of Treatment: 2 Clustered Wound: No Pending Amputation On Presentation Photos Wound Measurements Length: (cm) 0.2 % Reduction in Area: 56.3% Width: (cm) 0.2 % Reduction in Volume: 35.7% Depth: (cm) 0.3 Epithelialization: Small (1-33%) Area: (cm) 0.031 Tunneling: No Volume: (cm) 0.009 Undermining: Yes Starting Position (o'clock): 12 Ending Position (o'clock): 12 Maximum Distance: (cm) 0.3 Wound Description Classification: Grade 2 Foul Odor After Cleansing: No Wound Margin: Distinct, outline attached Slough/Fibrino No Exudate Amount: Medium Exudate Type: Serosanguineous Exudate Color: red, brown Wound Bed Granulation Amount: Large (67-100%) Exposed Structure Granulation Quality: Red Fascia Exposed: No Necrotic Amount: None Present (0%) Fat Layer (Subcutaneous Tissue) Exposed: Yes Tendon Exposed: No Muscle Exposed: No Joint Exposed: No Bone Exposed: No Periwound Skin Texture Texture Color No Abnormalities Noted: No No Abnormalities Noted: No Callus: Yes Atrophie Blanche: No Crepitus: No Cyanosis: No Excoriation: No Ecchymosis: No Induration: No Erythema: No Rash: No Hemosiderin Staining: No Scarring: No Mottled: No Pallor: No Moisture Rubor: No No Abnormalities Noted: No Dry / Scaly: No Maceration: No Treatment Notes [name], [name] ([id number]) [id number] Page 7 of 9 Wound #1 (Metatarsal head fifth) Wound Laterality: Right Cleanser Wound Cleanser Discharge Instruction: Cleanse the wound with wound cleanser prior to applying a clean dressing using gauze sponges, not tissue or cotton balls. Peri-Wound Care Skin Prep Discharge Instruction: Use skin prep as  directed Topical Primary Dressing MediHoney Gel, tube 1.5 (oz) Discharge Instruction: Apply to wound bed as instructed [place] 2x2 (in/in) Discharge Instruction: ***APPLY OVER THE [place] AND DO NOT PACK THE WOUND.*** Secondary Dressing Optifoam Non-Adhesive Dressing, 4x4 in Discharge Instruction: Apply ONE LAYER OF FOAM [name] Woven Gauze Sponges 2x2 in Discharge Instruction: Apply over primary dressing as  directed. Secured With Conforming Stretch Gauze Bandage, Sterile 2x75 (in/in) Discharge Instruction: Secure with stretch gauze as directed. 76M Medipore H Soft Cloth Surgical Brown ape, 4 x 10 (in/yd) Discharge Instruction: Secure with tape as directed. Compression Wrap Compression Stockings Add-Ons Electronic Signature(s) Signed: 01/16/2023 4:09:44 PM By: Rhae Hammock RN Entered By: Rhae Hammock on 01/10/2023 09:53:15 -------------------------------------------------------------------------------- Wound Assessment Details Patient Name: Date of Service: [name], [name] 01/10/2023 10:30 A M Medical Record Number: [id number] Patient Account Number: [id number] Date of Birth/Sex: Treating RN: 18-Dec-1996 (27 y.o. Burnadette Pop, Lauren Primary Care Saara Kijowski: Mina Marble Other Clinician: Referring Consepcion Utt: Treating Willia Lampert/Extender: Olivia Mackie Weeks in Treatment: 2 Wound Status Wound Number: 2 Primary Etiology: Diabetic Wound/Ulcer of the Lower Extremity Wound Location: Left Metatarsal head fifth Wound Status: Open Wounding Event: Gradually Appeared Comorbid History: Hypertension, Type I Diabetes, Neuropathy Date Acquired: 09/24/2022 Weeks Of Treatment: 2 Clustered Wound: No Pending Amputation On Presentation Photos [name], [name] ([id number]) [id number] Page 8 of 9 Wound Measurements Length: (cm) 0.2 Width: (cm) 0.4 Depth: (cm) 0.3 Area: (cm) 0.063 Volume: (cm) 0.019 % Reduction in Area:  -34% % Reduction in Volume: -35.7% Epithelialization: Small (1-33%) Tunneling: No Undermining: Yes Starting Position (o'clock): 12 Ending Position (o'clock): 12 Maximum Distance: (cm) 0.3 Wound Description Classification: Grade 2 Wound Margin: Distinct, outline attached Exudate Amount: Medium Exudate Type: Serosanguineous Exudate Color: red, brown Foul Odor After Cleansing: No Slough/Fibrino No Wound Bed Granulation Amount: Large (67-100%) Exposed Structure Granulation Quality: Red Fascia Exposed: No Necrotic Amount: None Present (0%) Fat Layer (Subcutaneous Tissue) Exposed: Yes Tendon Exposed: No Muscle Exposed: No Joint Exposed: No Bone Exposed: No Periwound Skin Texture Texture Color No Abnormalities Noted: No No Abnormalities Noted: No Callus: Yes Atrophie Blanche: No Crepitus: No Cyanosis: No Excoriation: No Ecchymosis: No Induration: No Erythema: No Rash: No Hemosiderin Staining: No Scarring: No Mottled: No Pallor: No Moisture Rubor: No No Abnormalities Noted: No Dry / Scaly: No Maceration: No Treatment Notes Wound #2 (Metatarsal head fifth) Wound Laterality: Left Cleanser Wound Cleanser Discharge Instruction: Cleanse the wound with wound cleanser prior to applying a clean dressing using gauze sponges, not tissue or cotton balls. Peri-Wound Care Skin Prep Discharge Instruction: Use skin prep as directed Topical Primary Dressing MediHoney Gel, tube 1.5 (oz) Discharge Instruction: Apply to wound bed as instructed [employer] Dressing 2x2 (in/in) Discharge Instruction: ***APPLY OVER THE [place] AND DO NOT PACK THE WOUND.*** TREYSHAUN, KEATTS ([id number]) [id number] Page 9 of 9 Secondary Dressing Optifoam Non-Adhesive Dressing, 4x4 in Discharge Instruction: Apply ONE LAYER OF FOAM [name] Woven Gauze Sponges 2x2 in Discharge Instruction: Apply over primary dressing as directed. Secured With Conforming Stretch Gauze Bandage,  Sterile 2x75 (in/in) Discharge Instruction: Secure with stretch gauze as directed. 76M Medipore H Soft Cloth Surgical Brown ape, 4 x 10 (in/yd) Discharge Instruction: Secure with tape as directed. Compression Wrap Compression Stockings Add-Ons Electronic Signature(s) Signed: 01/16/2023 4:09:44 PM By: Rhae Hammock RN Entered By: Rhae Hammock on 01/10/2023 09:53:29 -------------------------------------------------------------------------------- Vitals Details Patient Name: Date of Service: [name]. 01/10/2023 10:30 A M Medical Record Number: [id number] Patient Account Number: [id number] Date of Birth/Sex: Treating RN: 08-15-1996 (27 y.o. Burnadette Pop, Lauren Primary Care Atheena Spano: Mina Marble Other Clinician: Referring Addalynn Kumari: Treating Berdell Hostetler/Extender: Olivia Mackie Weeks in Treatment: 2 Vital Signs Time Taken: 09:46 Temperature (F): 98.7 Pulse (bpm): 74 Respiratory Rate (breaths/min): 17 Blood Pressure (mmHg): 111/74 Capillary Blood Glucose (mg/dl): 120 Reference Range: 80 - 120 mg / dl Electronic Signature(s) Signed: 01/16/2023 4:09:44  PM By: Rhae Hammock RN Entered By: Rhae Hammock on 01/10/2023 09:46:44

## 2023-01-17 ENCOUNTER — Encounter (HOSPITAL_BASED_OUTPATIENT_CLINIC_OR_DEPARTMENT_OTHER): Payer: BC Managed Care – PPO | Admitting: Internal Medicine

## 2023-01-17 DIAGNOSIS — E10621 Type 1 diabetes mellitus with foot ulcer: Secondary | ICD-10-CM | POA: Diagnosis not present

## 2023-01-17 DIAGNOSIS — E1040 Type 1 diabetes mellitus with diabetic neuropathy, unspecified: Secondary | ICD-10-CM | POA: Diagnosis not present

## 2023-01-17 DIAGNOSIS — I10 Essential (primary) hypertension: Secondary | ICD-10-CM | POA: Diagnosis not present

## 2023-01-17 DIAGNOSIS — L97528 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ty: Secondary | ICD-10-CM | POA: Diagnosis not present

## 2023-01-17 DIAGNOSIS — L97518 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ity: Secondary | ICD-10-CM

## 2023-01-17 DIAGNOSIS — M869 Osteomyelitis, unspecified: Secondary | ICD-10-CM | POA: Diagnosis not present

## 2023-01-17 DIAGNOSIS — Z89421 Acquired absence of other right toe(s): Secondary | ICD-10-CM | POA: Diagnosis not present

## 2023-01-21 ENCOUNTER — Other Ambulatory Visit: Payer: BC Managed Care – PPO

## 2023-01-23 NOTE — Progress Notes (Signed)
[name], [name] ([id number]) [id number] Page 1 of 9 Visit Report for 01/17/2023 Chief Complaint Document Details Patient Name: Date of Service: MACAULEY, MOSSBERG 01/17/2023 10:30 A M Medical Record Number: [id number] Patient Account Number: [id number] Date of Birth/Sex: Treating RN: March 17, 1996 (27 y.o. M) Primary Care Provider: Mina Marble Other Clinician: Referring Provider: Treating Provider/Extender: Olivia Mackie Weeks in Treatment: 3 Information Obtained from: Patient Chief Complaint 12/27/2022; bilateral diabetic foot wounds Electronic Signature(s) Signed: 01/17/2023 2:31:01 PM By: Kalman Shan DO Entered By: Kalman Shan on 01/17/2023 11:52:10 -------------------------------------------------------------------------------- Debridement Details Patient Name: Date of Service: [name] 01/17/2023 10:30 A M Medical Record Number: [id number] Patient Account Number: [id number] Date of Birth/Sex: Treating RN: [birth year] (27 y.o. Burnadette Pop, Lauren Primary Care Provider: Mina Marble Other Clinician: Referring Provider: Treating Provider/Extender: Olivia Mackie Weeks in Treatment: 3 Debridement Performed for Assessment: Wound #1 Right Metatarsal head fifth Performed By: Physician Kalman Shan, DO Debridement Type: Debridement Severity of Tissue Pre Debridement: Fat layer exposed Level of Consciousness (Pre-procedure): Awake and Alert Pre-procedure Verification/Time Out Yes - 11:04 Taken: Start Time: 11:04 Pain Control: Lidocaine T Area Debrided (L x W): otal 0.4 (cm) x 0.3 (cm) = 0.12 (cm) Tissue and other material debrided: Viable, Non-Viable, Callus, Slough, Subcutaneous, Slough Level: Skin/Subcutaneous Tissue Debridement Description: Excisional Instrument: Curette Bleeding: Minimum Hemostasis Achieved: Pressure End Time: 11:04 Procedural Pain: 0 Post Procedural Pain:  0 Response to Treatment: Procedure was tolerated well Level of Consciousness (Post- Awake and Alert procedure): Post Debridement Measurements of Total Wound Length: (cm) 0.4 Width: (cm) 0.3 Depth: (cm) 0.3 Volume: (cm) 0.028 Character of Wound/Ulcer Post Debridement: Improved Severity of Tissue Post Debridement: Fat layer exposed NAKUL, AVINO E ([id number]) [id number] Page 2 of 9 Post Procedure Diagnosis Same as Pre-procedure Electronic Signature(s) Signed: 01/17/2023 2:31:01 PM By: Kalman Shan DO Signed: 01/23/2023 8:39:24 AM By: Rhae Hammock RN Entered By: Rhae Hammock on 01/17/2023 11:05:03 -------------------------------------------------------------------------------- Debridement Details Patient Name: Date of Service: [name] 01/17/2023 10:30 A M Medical Record Number: [id number] Patient Account Number: [id number] Date of Birth/Sex: Treating RN: Jul 13, 1996 (27 y.o. Burnadette Pop, Lauren Primary Care Provider: Mina Marble Other Clinician: Referring Provider: Treating Provider/Extender: Olivia Mackie Weeks in Treatment: 3 Debridement Performed for Assessment: Wound #2 Left Metatarsal head fifth Performed By: Physician Kalman Shan, DO Debridement Type: Debridement Severity of Tissue Pre Debridement: Fat layer exposed Level of Consciousness (Pre-procedure): Awake and Alert Pre-procedure Verification/Time Out Yes - 11:04 Taken: Start Time: 11:04 Pain Control: Lidocaine T Area Debrided (L x W): otal 0.7 (cm) x 0.2 (cm) = 0.14 (cm) Tissue and other material debrided: Viable, Non-Viable, Callus, Slough, Subcutaneous, Slough Level: Skin/Subcutaneous Tissue Debridement Description: Excisional Instrument: Curette Bleeding: Minimum Hemostasis Achieved: Pressure End Time: 11:04 Procedural Pain: 0 Post Procedural Pain: 0 Response to Treatment: Procedure was tolerated well Level of  Consciousness (Post- Awake and Alert procedure): Post Debridement Measurements of Total Wound Length: (cm) 0.7 Width: (cm) 0.2 Depth: (cm) 0.2 Volume: (cm) 0.022 Character of Wound/Ulcer Post Debridement: Improved Severity of Tissue Post Debridement: Fat layer exposed Post Procedure Diagnosis Same as Pre-procedure Electronic Signature(s) Signed: 01/17/2023 2:31:01 PM By: Kalman Shan DO Signed: 01/23/2023 8:39:24 AM By: Rhae Hammock RN Entered By: Rhae Hammock on 01/17/2023 11:05:32 HPI Details -------------------------------------------------------------------------------- [name] ([id number]) [id number] Page 3 of 9 Patient Name: Date of Service: [name], [name] 01/17/2023 10:30 A M Medical Record Number: [id number] Patient Account Number: [id number] Date of Birth/Sex: Treating RN: 27-Jul-1996 (27 y.o. 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 He has been following with podiatry for this issue. He reports using Silvadene to the areas. Currently not using anything for offloading. He states that the right foot wound has been present for the past 1-1/2 years. He states that the left foot wound appeared about 3 months ago. He has not had recent imaging of the areas. Currently denies systemic signs of infection. 01/03/2023; patient presents for follow-up. He has been using Medihoney and silver alginate to the wound beds. He has been using his surgical shoes with peg assist. He has not been scheduled for an MRI yet.  There is been improvement in wound healing. He currently denies signs of infection. 1/18; patient presents for follow-up. He is scheduled to have his MRI on 1/29. He has been using silver alginate with Medihoney to the wound beds. He has been using his surgical shoes with peg assist. He has no issues or complaints today. He denies signs of infection. 1/25; patient presents for follow-up. He has been using silver alginate with Medihoney to the wound beds. He is starting to develop a new wound to the right lateral foot adjacent to the original right foot wound. He has been using his surgical shoes with peg assist. He currently denies signs of infection. Scheduled for MRI in 4 days. Electronic Signature(s) Signed: 01/17/2023 2:31:01 PM By: Kalman Shan DO Entered By: Kalman Shan on 01/17/2023 11:52:50 -------------------------------------------------------------------------------- Physical Exam Details Patient Name: Date of Service: [name], [name] 01/17/2023 10:30 A M Medical Record Number: [id number] Patient Account Number: [id number] Date of Birth/Sex: Treating RN: 15-Jan-1996 (27 y.o. M) Primary Care Provider: Mina Marble Other Clinician: Referring Provider: Treating Provider/Extender: Olivia Mackie Weeks in Treatment: 3 Constitutional respirations regular, non-labored and within target range for patient.. Cardiovascular 2+ dorsalis pedis/posterior tibialis pulses. Psychiatric pleasant and cooperative. Notes Bilateral fifth met head wounds to the feet. Granulation tissue With nonviable tissue and callus. No signs of infection including increased warmth, erythema or purulent drainage. Electronic Signature(s) Signed: 01/17/2023 2:31:01 PM By: Kalman Shan DO Entered By: Kalman Shan on 01/17/2023 41:66:06 -------------------------------------------------------------------------------- Physician Orders Details Patient Name: Date of  Service: [name] 01/17/2023 10:30 A M Medical Record Number: [id number] Patient Account Number: [id number] EVAN, MACKIE ([id number]) [id number] Page 4 of 9 Date of Birth/Sex: Treating RN: 03/11/1996 (27 y.o. Erie Noe Primary Care Provider: Mina Marble Other Clinician: Referring Provider: Treating Provider/Extender: Olivia Mackie Weeks in Treatment: 3 Verbal / Phone Orders: No Diagnosis Coding Follow-up Appointments ppointment in 1 week. - Thursday with Dr. Heber [place] on 01/24/23 @ 1015 Rm # 9 w/ Allayne Butcher Return A ppointment in 2 weeks. - Thursday with Dr. Heber [place] 01/31/23 @ 1100 Rm # 9 w/ Lauran Return A Other: - MRI 01/21/2023 ****REMEMBER TO REMOVE THE SILVER AGLINATE, [place] WOUNDS, COVER WITH BANDAGE BEFORE MRI.**** Anesthetic (In clinic) Topical Lidocaine 4% applied to wound bed Bathing/ Shower/ Hygiene May shower with protection but do not get wound dressing(s) wet. Protect dressing(s) with water repellant cover (for example, large plastic bag) or a cast cover and may then take shower. Off-Loading Open toe surgical shoe to: - both surgical  shoes with PEG ASSIST to both shoes. Wear while walking and standing. DO not walk barefoot or just in socks. Additional Orders / Instructions Follow Nutritious Diet - CLOSELY MONITOR BLOOD SUGAR. Wound Treatment Wound #1 - Metatarsal head fifth Wound Laterality: Right Cleanser: Wound Cleanser (Generic) 1 x Per Day/30 Days Discharge Instructions: Cleanse the wound with wound cleanser prior to applying a clean dressing using gauze sponges, not tissue or cotton balls. Peri-Wound Care: Skin Prep (Generic) 1 x Per Day/30 Days Discharge Instructions: Use skin prep as directed Prim Dressing: MediHoney Gel, tube 1.5 (oz) 1 x Per Day/30 Days ary Discharge Instructions: Apply to wound bed as instructed Prim Dressing: Sorbalgon AG Dressing 2x2 (in/in) (Generic) 1 x Per Day/30  Days ary Discharge Instructions: ***APPLY OVER THE MEDIHONEY AND DO NOT PACK THE WOUND.*** Secondary Dressing: Optifoam Non-Adhesive Dressing, 4x4 in (Generic) 1 x Per Day/30 Days Discharge Instructions: Apply ONE LAYER OF FOAM D Secondary Dressing: Woven Gauze Sponges 2x2 in (Generic) 1 x Per Day/30 Days Discharge Instructions: Apply over primary dressing as directed. Secured With: Child psychotherapist, Sterile 2x75 (in/in) (Generic) 1 x Per Day/30 Days Discharge Instructions: Secure with stretch gauze as directed. Secured With: 69M Medipore H Soft Cloth Surgical T ape, 4 x 10 (in/yd) (Generic) 1 x Per Day/30 Days Discharge Instructions: Secure with tape as directed. Wound #2 - Metatarsal head fifth Wound Laterality: Left Cleanser: Wound Cleanser (Generic) 1 x Per Day/30 Days Discharge Instructions: Cleanse the wound with wound cleanser prior to applying a clean dressing using gauze sponges, not tissue or cotton balls. Peri-Wound Care: Skin Prep (Generic) 1 x Per Day/30 Days Discharge Instructions: Use skin prep as directed Prim Dressing: MediHoney Gel, tube 1.5 (oz) 1 x Per Day/30 Days ary Discharge Instructions: Apply to wound bed as instructed Prim Dressing: Sorbalgon AG Dressing 2x2 (in/in) (Generic) 1 x Per Day/30 Days ary Discharge Instructions: ***APPLY OVER THE MEDIHONEY AND DO NOT PACK THE WOUND.*** Secondary Dressing: Optifoam Non-Adhesive Dressing, 4x4 in (Generic) 1 x Per Day/30 Days Discharge Instructions: Apply ONE LAYER OF FOAM D Secondary Dressing: Woven Gauze Sponges 2x2 in (Generic) 1 x Per Day/30 Days Discharge Instructions: Apply over primary dressing as directed. Secured With: Child psychotherapist, Sterile 2x75 (in/in) (Generic) 1 x Per Day/30 Days Discharge Instructions: Secure with stretch gauze as directed. Secured With: 69M Medipore H Soft Cloth Surgical T ape, 4 x 10 (in/yd) (Generic) 1 x Per Day/30 Days Discharge Instructions: Secure with  tape as directed. OAK, DOREY ([id number]) [id number] Page 5 of 9 Electronic Signature(s) Signed: 01/17/2023 2:31:01 PM By: Kalman Shan DO Entered By: Kalman Shan on 01/17/2023 12:06:25 -------------------------------------------------------------------------------- Problem List Details Patient Name: Date of Service: [name], [name] 01/17/2023 10:30 A M Medical Record Number: [id number] Patient Account Number: [id number] Date of Birth/Sex: Treating RN: [birth year] (27 y.o. 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 12/27/2022 No Yes severity L97.528 Non-pressure chronic ulcer of other part of left foot with other specified 12/27/2022 No Yes severity E10.40 Type 1 diabetes mellitus with diabetic neuropathy, unspecified 12/27/2022 No Yes E10.621 Type 1 diabetes mellitus with foot ulcer 12/27/2022 No Yes Inactive Problems Resolved Problems Electronic Signature(s) Signed: 01/17/2023 2:31:01 PM By: Kalman Shan DO Entered By: Kalman Shan on 01/17/2023 11:51:54 -------------------------------------------------------------------------------- Progress Note Details Patient Name:  Date of Service: [name], [name] 01/17/2023 10:30 A M Medical Record Number: [id number] Patient Account Number: [id number] Date of Birth/Sex: Treating RN: 10-Nov-1996 (27 y.o. M) Primary Care Provider: Mina Marble Other Clinician: Referring Provider: Treating Provider/Extender: Olivia Mackie Weeks in Treatment: [address] [place], [place] ([id number]) [id number] Page 6 of 9 Chief Complaint Information obtained from  Patient 12/27/2022; bilateral diabetic foot wounds History of Present Illness (HPI) 12/27/2022 Mr. 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 He has been following with podiatry for this issue. He reports using Silvadene to the areas. Currently not using anything for offloading. He states that the right foot wound has been present for the past 1-1/2 years. He states that the left foot wound appeared about 3 months ago. He has not had recent imaging of the areas. Currently denies systemic signs of infection. 01/03/2023; patient presents for follow-up. He has been using Medihoney and silver alginate to the wound beds. He has been using his surgical shoes with peg assist. He has not been scheduled for an MRI yet. There is been improvement in wound healing. He currently denies signs of infection. 1/18; patient presents for follow-up. He is scheduled to have his MRI on 1/29. He has been using silver alginate with Medihoney to the wound beds. He has been using his surgical shoes with peg assist. He has no issues or complaints today. He denies signs of infection. 1/25; patient presents for follow-up. He has been using silver alginate with Medihoney to the wound beds. He is starting to develop a new wound to the right lateral foot adjacent to the original right foot wound. He has been using his surgical shoes with peg assist. He currently denies signs of infection. Scheduled for MRI in 4 days. Patient History Information obtained from Patient, Chart. Family History Unknown History. Social History Never smoker, Marital Status - Single, Alcohol Use - Never, Drug Use - No History, Caffeine Use - Rarely. Medical History Cardiovascular Patient has history of Hypertension Endocrine Patient has history of Type I  Diabetes Denies history of Type II Diabetes Neurologic Patient has history of Neuropathy Hospitalization/Surgery History - R foot wound debridement and closure. 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 Medical A Surgical History Notes nd Cardiovascular heart murmur Objective Constitutional respirations regular, non-labored and within target range for patient.. Vitals Time Taken: 10:23 AM, Temperature: 98.3 F, Pulse: 88 bpm, Respiratory Rate: 18 breaths/min, Blood Pressure: 114/78 mmHg, Capillary Blood Glucose: 170 mg/dl. Cardiovascular 2+ dorsalis pedis/posterior tibialis pulses. Psychiatric pleasant and cooperative. General Notes: Bilateral fifth met head wounds to the feet. Granulation tissue With nonviable tissue and callus. No signs of infection including increased warmth, erythema or purulent drainage. Integumentary (Hair, Skin) Wound #1 status is Open. Original cause of wound was Gradually Appeared. The date acquired was: 12/25/2019. The wound has been in treatment 3 weeks. The wound is located on the Right Metatarsal head fifth. The wound measures 0.4cm length x 0.3cm width x 0.3cm depth; 0.094cm^2 area and 0.028cm^3 volume. There is Fat Layer (Subcutaneous Tissue) exposed. There is no tunneling or undermining noted. There is a medium amount of serosanguineous drainage noted. The wound margin is distinct with the outline attached to the wound base. There is large (67-100%) red granulation within the wound bed. There is no necrotic tissue within the wound bed. The  periwound skin appearance exhibited: Callus. The periwound skin appearance did not exhibit: Crepitus, Excoriation, Induration, Rash, Scarring, Dry/Scaly, Maceration, Atrophie Blanche, Cyanosis, Ecchymosis, Hemosiderin Staining, Mottled, Pallor, Rubor, Erythema. Wound #2 status is Open. Original cause of wound was Gradually Appeared. The date acquired was: 09/24/2022. The wound has been in treatment 3 weeks. The wound is located on the Left  Metatarsal head fifth. The wound measures 0.7cm length x 0.2cm width x 0.2cm depth; 0.11cm^2 area and 0.022cm^3 volume. There is Fat Layer (Subcutaneous Tissue) exposed. There is no tunneling or undermining noted. There is a medium amount of serosanguineous drainage noted. The wound margin is distinct with the outline attached to the wound base. There is large (67-100%) red granulation within the wound bed. There is no necrotic tissue within the wound bed. The periwound skin appearance exhibited: Callus. The periwound skin appearance did not exhibit: Crepitus, Excoriation, CAROLOS, FECHER ([id number]) [id number] Page 7 of 9 Rash, Scarring, Dry/Scaly, Maceration, Atrophie Blanche, Cyanosis, Ecchymosis, Hemosiderin Staining, Mottled, Pallor, Rubor, Erythema. 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 I debrided nonviable tissue. I recommended continue Medihoney and silver alginate. He is scheduled for bilateral MRIs of his feet on 1/29. If no osteomyelitis noted then plan is for right foot cast at next clinic visit. Continue aggressive offloading with peg assist and surgical shoes. Procedures Wound #1 Pre-procedure diagnosis of Wound #1 is a Diabetic Wound/Ulcer of the Lower Extremity located on the Right Metatarsal head fifth .Severity of Tissue Pre Debridement is: Fat layer exposed. There was a Excisional Skin/Subcutaneous Tissue Debridement with a total area of 0.12 sq cm performed by Kalman Shan, DO. With the following instrument(s): Curette to remove Viable and Non-Viable tissue/material. Material removed includes Callus, Subcutaneous Tissue, and Slough after  achieving pain control using Lidocaine. No specimens were taken. A time out was conducted at 11:04, prior to the start of the procedure. A Minimum amount of bleeding was controlled with Pressure. The procedure was tolerated well with a pain level of 0 throughout and a pain level of 0 following the procedure. Post Debridement Measurements: 0.4cm length x 0.3cm width x 0.3cm depth; 0.028cm^3 volume. Character of Wound/Ulcer Post Debridement is improved. Severity of Tissue Post Debridement is: Fat layer exposed. Post procedure Diagnosis Wound #1: Same as Pre-Procedure Wound #2 Pre-procedure diagnosis of Wound #2 is a Diabetic Wound/Ulcer of the Lower Extremity located on the Left Metatarsal head fifth .Severity of Tissue Pre Debridement is: Fat layer exposed. There was a Excisional Skin/Subcutaneous Tissue Debridement with a total area of 0.14 sq cm performed by Kalman Shan, DO. With the following instrument(s): Curette to remove Viable and Non-Viable tissue/material. Material removed includes Callus, Subcutaneous Tissue, and Slough after achieving pain control using Lidocaine. No specimens were taken. A time out was conducted at 11:04, prior to the start of the procedure. A Minimum amount of bleeding was controlled with Pressure. The procedure was tolerated well with a pain level of 0 throughout and a pain level of 0 following the procedure. Post Debridement Measurements: 0.7cm length x 0.2cm width x 0.2cm depth; 0.022cm^3 volume. Character of Wound/Ulcer Post Debridement is improved. Severity of Tissue Post Debridement is: Fat layer exposed. Post procedure Diagnosis Wound #2: Same as Pre-Procedure Plan Follow-up Appointments: Return Appointment in  1 week. - Thursday with Dr. Heber [facility] on 01/24/23 @ 1015 Rm # 9 w/ Allayne Butcher Return Appointment in 2 weeks. - Thursday with Dr. Heber  01/31/23 @ 1100 Rm # 9 w/ Lauran Other: - MRI 01/21/2023 ****REMEMBER TO REMOVE THE SILVER AGLINATE, [place] WOUNDS, COVER  WITH BANDAGE BEFORE MRI.**** Anesthetic: (In clinic) Topical Lidocaine 4% applied to wound bed Bathing/ Shower/ Hygiene: May shower with protection but do not get wound dressing(s) wet. Protect dressing(s) with water repellant cover (for example, large plastic bag) or a cast cover and may then take shower. Off-Loading: Open toe surgical shoe to: - both surgical shoes with PEG ASSIST to both shoes. Wear while walking and standing. DO not walk barefoot or just in socks. Additional Orders / Instructions: Follow Nutritious Diet - CLOSELY MONITOR BLOOD SUGAR. WOUND #1: - Metatarsal head fifth Wound Laterality: Right Cleanser: Wound Cleanser (Generic) 1 x Per Day/30 Days Discharge Instructions: Cleanse the wound with wound cleanser prior to applying a clean dressing using gauze sponges, not tissue or cotton balls. Peri-Wound Care: Skin Prep (Generic) 1 x Per Day/30 Days Discharge Instructions: Use skin prep as directed Prim Dressing: MediHoney Gel, tube 1.5 (oz) 1 x Per Day/30 Days ary Discharge Instructions: Apply to wound bed as instructed Prim Dressing: Sorbalgon AG Dressing 2x2 (in/in) (Generic) 1 x Per Day/30 Days ary Discharge Instructions: ***APPLY OVER THE MEDIHONEY AND DO NOT PACK THE WOUND.*** Secondary Dressing: Optifoam Non-Adhesive Dressing, 4x4 in (Generic) 1 x Per Day/30 Days Discharge Instructions: Apply ONE LAYER OF FOAM D Secondary Dressing: Woven Gauze Sponges 2x2 in (Generic) 1 x Per Day/30 Days Discharge Instructions: Apply over primary dressing as directed. Secured With: Child psychotherapist, Sterile 2x75 (in/in) (Generic) 1 x Per Day/30 Days Discharge Instructions: Secure with stretch gauze as directed. Secured With: 62M Medipore H Soft Cloth Surgical T ape, 4 x 10 (in/yd) (Generic) 1 x Per Day/30 Days Discharge Instructions: Secure with tape as directed. WOUND #2: - Metatarsal head fifth Wound Laterality: Left Cleanser: Wound Cleanser (Generic) 1 x Per  Day/30 Days Discharge Instructions: Cleanse the wound with wound cleanser prior to applying a clean dressing using gauze sponges, not tissue or cotton balls. Peri-Wound Care: Skin Prep (Generic) 1 x Per Day/30 Days [name], [name] ([id number]) [id number] Page 8 of 9 Discharge Instructions: Use skin prep as directed Prim Dressing: MediHoney Gel, tube 1.5 (oz) 1 x Per Day/30 Days ary Discharge Instructions: Apply to wound bed as instructed Prim Dressing: Sorbalgon AG Dressing 2x2 (in/in) (Generic) 1 x Per Day/30 Days ary Discharge Instructions: ***APPLY OVER THE MEDIHONEY AND DO NOT PACK THE WOUND.*** Secondary Dressing: Optifoam Non-Adhesive Dressing, 4x4 in (Generic) 1 x Per Day/30 Days Discharge Instructions: Apply ONE LAYER OF FOAM D Secondary Dressing: Woven Gauze Sponges 2x2 in (Generic) 1 x Per Day/30 Days Discharge Instructions: Apply over primary dressing as directed. Secured With: Child psychotherapist, Sterile 2x75 (in/in) (Generic) 1 x Per Day/30 Days Discharge Instructions: Secure with stretch gauze as directed. Secured With: 62M Medipore H Soft Cloth Surgical T ape, 4 x 10 (in/yd) (Generic) 1 x Per Day/30 Days Discharge Instructions: Secure with tape as directed. 1. In office sharp debridement 2. Silver alginate with [facility] 3. Peg assist with surgical shoe 4. Follow-up in 1 week Electronic Signature(s) Signed: 01/17/2023 2:31:01 PM By: Kalman Shan DO Entered By: Kalman Shan on 01/17/2023 12:14:18 -------------------------------------------------------------------------------- HxROS Details Patient Name: Date of Service: [name] 01/17/2023 10:30 A M Medical Record Number: [id number] Patient Account Number: [id number]  Date of Birth/Sex: Treating RN: 02-Jul-1996 (27 y.o. M) Primary Care Provider: Mina Marble Other Clinician: Referring Provider: Treating Provider/Extender: Olivia Mackie Weeks in Treatment: 3 Information Obtained From Patient Chart Cardiovascular Medical History: Positive for: Hypertension Past Medical History Notes: heart murmur Endocrine Medical History: Positive for: Type I Diabetes Negative for: Type II Diabetes Time with diabetes: 1 year ago Treated with: Insulin, Diet Blood sugar tested every day: Yes Tested : TID Neurologic Medical History: Positive for: Neuropathy Immunizations Pneumococcal Vaccine: Received Pneumococcal Vaccination: No Implantable Devices None Hospitalization / Surgery History Type of Hospitalization/Surgery R foot wound debridement and closure KHALEED, HOLAN ([id number]) [id number] Page 9 of 9 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 Family and Social History Unknown History: Yes; Never smoker; Marital Status - Single; Alcohol Use: Never; Drug Use: No History; Caffeine Use: Rarely; Financial Concerns: No; Food, Clothing or Shelter Needs: No; Support System Lacking: No; Transportation Concerns: No Electronic Signature(s) Signed: 01/17/2023 2:31:01 PM By: Kalman Shan DO Entered By: Kalman Shan on 01/17/2023 11:52:55 -------------------------------------------------------------------------------- SuperBill Details Patient Name: Date of Service: DMITRIY, [name] 01/17/2023 Medical Record Number: [id number] Patient Account Number: [id number] Date of Birth/Sex: Treating RN: 20-Oct-1996 (27 y.o. 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 E10.40 Type 1 diabetes mellitus with diabetic neuropathy, unspecified E10.621 Type 1 diabetes  mellitus with foot ulcer Facility Procedures : CPT4 Code: [id number] Description: 76734 - DEB SUBQ TISSUE 20 SQ CM/< ICD-10 Diagnosis Description L97.518 Non-pressure chronic ulcer of other part of right foot with other specified sev L97.528 Non-pressure chronic ulcer of other part of left foot with other specified seve  E10.621 Type 1 diabetes mellitus with foot ulcer Modifier: erity rity Quantity: 1 Physician Procedures : CPT4 Code Description Modifier 1937902 40973 - WC PHYS SUBQ TISS 20 SQ CM ICD-10 Diagnosis Description L97.518 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ity L97.528 Non-pressure chronic ulcer of other part of left  foot with other specified severity E10.621 Type 1 diabetes mellitus with foot ulcer Quantity: 1 Electronic Signature(s) Signed: 01/17/2023 2:31:01 PM By: Kalman Shan DO Entered By: Kalman Shan on 01/17/2023 12:14:32

## 2023-01-23 NOTE — Progress Notes (Signed)
SALBADOR, FIVEASH ([id number]) [id number] Page 1 of 7 Visit Report for 01/17/2023 Arrival Information Details Patient Name: Date of Service: [name], [name] 01/17/2023 10:30 A M Medical Record Number: [id number] Patient Account Number: [id number] Date of Birth/Sex: Treating RN: 1996/06/01 (27 y.o. M) Primary Care Giancarlos Berendt: Mina Marble Other Clinician: Referring Jontez Redfield: Treating Rebacca Votaw/Extender: Olivia Mackie Weeks in Treatment: 3 Visit Information History Since Last Visit Added or deleted any medications: No Patient Arrived: Ambulatory Any new allergies or adverse reactions: No Arrival Time: 10:23 Had a fall or experienced change in No Accompanied By: self activities of daily living that may affect Transfer Assistance: None risk of falls: Patient Identification Verified: Yes Signs or symptoms of abuse/neglect since last visito No Secondary Verification Process Completed: Yes Hospitalized since last visit: No Patient Requires Transmission-Based Precautions: Yes Implantable device outside of the clinic excluding No Transmission-Based Precautions: Contact MRSA cellular tissue based products placed in the center Patient Has Alerts: No since last visit: Has Dressing in Place as Prescribed: Yes Pain Present Now: No Electronic Signature(s) Signed: 01/21/2023 4:38:40 PM By: Erenest Blank Entered By: Erenest Blank on 01/17/2023 10:23:28 -------------------------------------------------------------------------------- Lower Extremity Assessment Details Patient Name: Date of Service: [name], [name] 01/17/2023 10:30 A M Medical Record Number: [id number] Patient Account Number: [id number] Date of Birth/Sex: Treating RN: [birth year] (27 y.o. M) Primary Care Biana Haggar: Mina Marble Other Clinician: Referring Mayci Haning: Treating Sandra Tellefsen/Extender: Olivia Mackie Weeks in Treatment: 3 Edema  Assessment Assessed: [Left: No] [Right: No] Edema: [Left: No] [Right: No] Calf Left: Right: Point of Measurement: 34 cm From Medial Instep 38 cm 37 cm Ankle Left: Right: Point of Measurement: 9 cm From Medial Instep 22 cm 21.5 cm Electronic Signature(s) Signed: 01/21/2023 4:38:40 PM By: Erenest Blank Entered By: Erenest Blank on 01/17/2023 10:33:10 [name] ([id number]) [id number] Page 2 of 7 -------------------------------------------------------------------------------- Multi Wound Chart Details Patient Name: Date of Service: [name], [name] 01/17/2023 10:30 A M Medical Record Number: [id number] Patient Account Number: [id number] Date of Birth/Sex: Treating RN: 10-24-1996 (27 y.o. M) Primary Care Irving Bloor: Mina Marble Other Clinician: Referring Daylah Sayavong: Treating Casidy Alberta/Extender: Olivia Mackie Weeks in Treatment: 3 Vital Signs Height(in): Capillary Blood Glucose(mg/dl): 170 Weight(lbs): Pulse(bpm): 62 Body Mass Index(BMI): Blood Pressure(mmHg): 114/78 Temperature(F): 98.3 Respiratory Rate(breaths/min): 18 [1:Photos:] [N/A:N/A] Right Metatarsal head fifth Left Metatarsal head fifth N/A Wound Location: Gradually Appeared Gradually Appeared N/A Wounding Event: Diabetic Wound/Ulcer of the Lower Diabetic Wound/Ulcer of the Lower N/A Primary Etiology: Extremity Extremity Hypertension, Type I Diabetes, Hypertension, Type I Diabetes, N/A Comorbid History: Neuropathy Neuropathy 12/25/2019 09/24/2022 N/A Date Acquired: 3 3 N/A Weeks of Treatment: Open Open N/A Wound Status: No No N/A Wound Recurrence: Yes Yes N/A Pending A mputation on Presentation: 0.4x0.3x0.3 0.7x0.2x0.2 N/A Measurements L x W x D (cm) 0.094 0.11 N/A A (cm) : rea 0.028 0.022 N/A Volume (cm) : -32.40% -134.00% N/A % Reduction in A rea: -100.00% -57.10% N/A % Reduction in Volume: Grade 2 Grade 2 N/A Classification: Medium  Medium N/A Exudate A mount: Serosanguineous Serosanguineous N/A Exudate Type: red, brown red, brown N/A Exudate Color: Distinct, outline attached Distinct, outline attached N/A Wound Margin: Large (67-100%) Large (67-100%) N/A Granulation A mount: Red Red N/A Granulation Quality: None Present (0%) None Present (0%) N/A Necrotic A mount: Fat Layer (Subcutaneous Tissue): Yes Fat Layer (Subcutaneous Tissue): Yes N/A Exposed Structures: Fascia: No Fascia: No Tendon: No Tendon: No Muscle: No Muscle: No Joint: No Joint: No Bone: No Bone: No Small (1-33%) Small (1-33%) N/A  Epithelialization: Debridement - Excisional Debridement - Excisional N/A Debridement: Pre-procedure Verification/Time Out 11:04 11:04 N/A Taken: Lidocaine Lidocaine N/A Pain Control: Callus, Subcutaneous, Slough Callus, Subcutaneous, Slough N/A Tissue Debrided: Skin/Subcutaneous Tissue Skin/Subcutaneous Tissue N/A Level: 0.12 0.14 N/A Debridement A (sq cm): rea Curette Curette N/A Instrument: Minimum Minimum N/A Bleeding: Pressure Pressure N/A Hemostasis A chieved: 0 0 N/A Procedural Pain: 0 0 N/A Post Procedural Pain: Procedure was tolerated well Procedure was tolerated well N/A Debridement Treatment Response: 0.4x0.3x0.3 0.7x0.2x0.2 N/A Post Debridement Measurements L x W x D (cm) [name], [name] ([id number]) [id number].pdf Page 3 of 7 0.028 0.022 N/A Post Debridement Volume: (cm) Callus: Yes Callus: Yes N/A Periwound Skin Texture: Excoriation: No Excoriation: No Induration: No Induration: No Crepitus: No Crepitus: No Rash: No Rash: No Scarring: No Scarring: No Maceration: No Maceration: No N/A Periwound Skin Moisture: Dry/Scaly: No Dry/Scaly: No Atrophie Blanche: No Atrophie Blanche: No N/A Periwound Skin Color: Cyanosis: No Cyanosis: No Ecchymosis: No Ecchymosis: No Erythema: No Erythema: No Hemosiderin Staining: No Hemosiderin Staining:  No Mottled: No Mottled: No Pallor: No Pallor: No Rubor: No Rubor: No Debridement Debridement N/A Procedures Performed: Treatment Notes Electronic Signature(s) Signed: 01/17/2023 2:31:01 PM By: Kalman Shan DO Entered By: Kalman Shan on 01/17/2023 11:52:00 -------------------------------------------------------------------------------- Multi-Disciplinary Care Plan Details Patient Name: Date of Service: [name] 01/17/2023 10:30 A M Medical Record Number: [id number] Patient Account Number: [id number] Date of Birth/Sex: Treating RN: 02-Aug-1996 (27 y.o. Burnadette Pop, Lauren Primary Care Torie Towle: Mina Marble Other Clinician: Referring Sherrell Farish: Treating Shevaun Lovan/Extender: Olivia Mackie Weeks in Treatment: 3 Active Inactive Pain, Acute or Chronic Nursing Diagnoses: Pain, acute or chronic: actual or potential Potential alteration in comfort, pain Goals: Patient will verbalize adequate pain control and receive pain control interventions during procedures as needed Date Initiated: 12/27/2022 Target Resolution Date: 02/01/2023 Goal Status: Active Patient/caregiver will verbalize comfort level met Date Initiated: 12/27/2022 Target Resolution Date: 01/31/2023 Goal Status: Active Interventions: Complete pain assessment as per visit requirements Encourage patient to take pain medications as prescribed Provide education on pain management Treatment Activities: Administer pain control measures as ordered : 12/27/2022 Notes: Wound/Skin Impairment Nursing Diagnoses: Knowledge deficit related to ulceration/compromised skin integrity Goals: Patient/caregiver will verbalize understanding of skin care regimen Date Initiated: 12/27/2022 Target Resolution Date: 01/31/2023 [name], [name] ([id number]) [id number] Page 4 of 7 Goal Status: Active Interventions: Assess patient/caregiver ability to perform ulcer/skin care regimen upon  admission and as needed Assess ulceration(s) every visit Provide education on ulcer and skin care Treatment Activities: Skin care regimen initiated : 12/27/2022 Topical wound management initiated : 12/27/2022 Notes: Electronic Signature(s) Signed: 01/23/2023 8:39:24 AM By: Rhae Hammock RN Entered By: Rhae Hammock on 01/17/2023 11:01:56 -------------------------------------------------------------------------------- Pain Assessment Details Patient Name: Date of Service: [name], [name] 01/17/2023 10:30 A M Medical Record Number: [id number] Patient Account Number: [id number] Date of Birth/Sex: Treating RN: 08-25-1996 (27 y.o. M) Primary Care Colton Engdahl: Mina Marble Other Clinician: Referring Wake Conlee: Treating Saleen Peden/Extender: Olivia Mackie Weeks in Treatment: 3 Active Problems Location of Pain Severity and Description of Pain Patient Has Paino No Site Locations Pain Management and Medication Current Pain Management: Electronic Signature(s) Signed: 01/21/2023 4:38:40 PM By: Erenest Blank Entered By: Erenest Blank on 01/17/2023 10:24:47 Patient/Caregiver Education Details -------------------------------------------------------------------------------- [name] ([id number]) [id number] Page 5 of 7 Patient Name: Date of Service: [name], [name]. 1/25/2024andnbsp10:30 A M Medical Record Number: [id number] Patient Account Number: [id number] Date of Birth/Gender: Treating RN: 05/17/1996 (27 y.o. Erie Noe Primary Care Physician: Mina Marble Other  Clinician: Referring Physician: Treating Physician/Extender: Burns Spain in Treatment: 3 Education Assessment Education Provided To: Patient Education Topics Provided Wound/Skin Impairment: Methods: Explain/Verbal Responses: Reinforcements needed, State content correctly Electronic Signature(s) Signed: 01/23/2023 8:39:24  AM By: Rhae Hammock RN Entered By: Rhae Hammock on 01/17/2023 11:02:08 -------------------------------------------------------------------------------- Wound Assessment Details Patient Name: Date of Service: [name], [name] 01/17/2023 10:30 A M Medical Record Number: [id number] Patient Account Number: [id number] Date of Birth/Sex: Treating RN: 1996-05-01 (27 y.o. M) Primary Care Shae Augello: Mina Marble Other Clinician: Referring Floris Neuhaus: Treating Marizol Borror/Extender: Olivia Mackie Weeks in Treatment: 3 Wound Status Wound Number: 1 Primary Etiology: Diabetic Wound/Ulcer of the Lower Extremity Wound Location: Right Metatarsal head fifth Wound Status: Open Wounding Event: Gradually Appeared Comorbid History: Hypertension, Type I Diabetes, Neuropathy Date Acquired: 12/25/2019 Weeks Of Treatment: 3 Clustered Wound: No Pending Amputation On Presentation Photos Wound Measurements Length: (cm) 0.4 Width: (cm) 0.3 Depth: (cm) 0.3 Area: (cm) 0.094 Volume: (cm) 0.028 % Reduction in Area: -32.4% % Reduction in Volume: -100% Epithelialization: Small (1-33%) Tunneling: No Undermining: No Wound Description Classification: Grade 2 Wound Margin: Distinct, outline attached [name], [name] ([id number]) Exudate Amount: Medium Exudate Type: Serosanguineous Exudate Color: red, brown Foul Odor After Cleansing: No Slough/Fibrino No [id number] Page 6 of 7 Wound Bed Granulation Amount: Large (67-100%) Exposed Structure Granulation Quality: Red Fascia Exposed: No Necrotic Amount: None Present (0%) Fat Layer (Subcutaneous Tissue) Exposed: Yes Tendon Exposed: No Muscle Exposed: No Joint Exposed: No Bone Exposed: No Periwound Skin Texture Texture Color No Abnormalities Noted: No No Abnormalities Noted: No Callus: Yes Atrophie Blanche: No Crepitus: No Cyanosis: No Excoriation: No Ecchymosis: No Induration: No Erythema:  No Rash: No Hemosiderin Staining: No Scarring: No Mottled: No Pallor: No Moisture Rubor: No No Abnormalities Noted: No Dry / Scaly: No Maceration: No Electronic Signature(s) Signed: 01/21/2023 4:38:40 PM By: Erenest Blank Entered By: Erenest Blank on 01/17/2023 10:38:07 -------------------------------------------------------------------------------- Wound Assessment Details Patient Name: Date of Service: [name], [name] 01/17/2023 10:30 A M Medical Record Number: [id number] Patient Account Number: [id number] Date of Birth/Sex: Treating RN: 04-17-1996 (27 y.o. M) Primary Care Gloristine Turrubiates: Mina Marble Other Clinician: Referring Donyell Ding: Treating Tian Davison/Extender: Olivia Mackie Weeks in Treatment: 3 Wound Status Wound Number: 2 Primary Etiology: Diabetic Wound/Ulcer of the Lower Extremity Wound Location: Left Metatarsal head fifth Wound Status: Open Wounding Event: Gradually Appeared Comorbid History: Hypertension, Type I Diabetes, Neuropathy Date Acquired: 09/24/2022 Weeks Of Treatment: 3 Clustered Wound: No Pending Amputation On Presentation Photos Wound Measurements Length: (cm) 0 Arya, Jermal Brown ([id number]) Width: (cm) 0. Depth: (cm) 0. Area: (cm) 0 Volume: (cm) 0 .7 % Reduction in Area: -134% [id number] Page 7 of 7 2 % Reduction in Volume: -57.1% 2 Epithelialization: Small (1-33%) .11 Tunneling: No .022 Undermining: No Wound Description Classification: Grade 2 Wound Margin: Distinct, outline attached Exudate Amount: Medium Exudate Type: Serosanguineous Exudate Color: red, brown Foul Odor After Cleansing: No Slough/Fibrino No Wound Bed Granulation Amount: Large (67-100%) Exposed Structure Granulation Quality: Red Fascia Exposed: No Necrotic Amount: None Present (0%) Fat Layer (Subcutaneous Tissue) Exposed: Yes Tendon Exposed: No Muscle Exposed: No Joint Exposed: No Bone Exposed: No Periwound  Skin Texture Texture Color No Abnormalities Noted: No No Abnormalities Noted: No Callus: Yes Atrophie Blanche: No Crepitus: No Cyanosis: No Excoriation: No Ecchymosis: No Induration: No Erythema: No Rash: No Hemosiderin Staining: No Scarring: No Mottled: No Pallor: No Moisture Rubor: No No Abnormalities Noted: No Dry / Scaly: No Maceration: No Electronic Signature(s) Signed: 01/21/2023 4:38:40  PM By: Erenest Blank Entered By: Erenest Blank on 01/17/2023 10:39:00 -------------------------------------------------------------------------------- Vitals Details Patient Name: Date of Service: [name], [name] 01/17/2023 10:30 A M Medical Record Number: [id number] Patient Account Number: [id number] Date of Birth/Sex: Treating RN: [birth year] (28 y.o. M) Primary Care Makinsley Schiavi: Mina Marble Other Clinician: Referring Correll Denbow: Treating Nieves Chapa/Extender: Olivia Mackie Weeks in Treatment: 3 Vital Signs Time Taken: 10:23 Temperature (F): 98.3 Pulse (bpm): 88 Respiratory Rate (breaths/min): 18 Blood Pressure (mmHg): 114/78 Capillary Blood Glucose (mg/dl): 170 Reference Range: 80 - 120 mg / dl Electronic Signature(s) Signed: 01/21/2023 4:38:40 PM By: Erenest Blank Entered By: Erenest Blank on 01/17/2023 10:24:41

## 2023-01-24 ENCOUNTER — Encounter (HOSPITAL_BASED_OUTPATIENT_CLINIC_OR_DEPARTMENT_OTHER): Payer: BC Managed Care – PPO | Attending: Physician Assistant | Admitting: Internal Medicine

## 2023-01-24 DIAGNOSIS — I10 Essential (primary) hypertension: Secondary | ICD-10-CM | POA: Diagnosis not present

## 2023-01-24 DIAGNOSIS — L97518 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ity: Secondary | ICD-10-CM | POA: Insufficient documentation

## 2023-01-24 DIAGNOSIS — E10621 Type 1 diabetes mellitus with foot ulcer: Secondary | ICD-10-CM | POA: Diagnosis not present

## 2023-01-24 DIAGNOSIS — Z794 Long term (current) use of insulin: Secondary | ICD-10-CM | POA: Diagnosis not present

## 2023-01-24 DIAGNOSIS — L97528 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ty: Secondary | ICD-10-CM

## 2023-01-24 DIAGNOSIS — E1040 Type 1 diabetes mellitus with diabetic neuropathy, unspecified: Secondary | ICD-10-CM | POA: Insufficient documentation

## 2023-01-25 NOTE — Progress Notes (Signed)
JUMAR, GREENSTREET ([id number]) [phone].pdf Page 1 of 10 Visit Report for 01/24/2023 Arrival Information Details Patient Name: Date of Service: [name], [name] 01/24/2023 10:15 A M Medical Record Number: [id number] Patient Account Number: [id number] Date of Birth/Sex: Treating RN: 12-13-1996 (27 y.o. M) Primary Care Dorise Gangi: Mina Marble Other Clinician: Referring [place] Hanlon: Treating Elvie Palomo/Extender: Olivia Mackie Weeks in Treatment: 4 Visit Information History Since Last Visit Added or deleted any medications: No Patient Arrived: Ambulatory Any new allergies or adverse reactions: No Arrival Time: 09:58 Had a fall or experienced change in No Accompanied By: self activities of daily living that may affect Transfer Assistance: None risk of falls: Patient Identification Verified: Yes Signs or symptoms of abuse/neglect since last visito No Secondary Verification Process Completed: Yes Hospitalized since last visit: No Patient Requires Transmission-Based Precautions: Yes Implantable device outside of the clinic excluding No Transmission-Based Precautions: Contact MRSA cellular tissue based products placed in the center Patient Has Alerts: No since last visit: Has Dressing in Place as Prescribed: Yes Pain Present Now: No Electronic Signature(s) Signed: 01/25/2023 12:35:45 PM By: Erenest Blank Entered By: Erenest Blank on 01/24/2023 10:00:28 -------------------------------------------------------------------------------- Encounter Discharge Information Details Patient Name: Date of Service: [name] 01/24/2023 10:15 A M Medical Record Number: [id number] Patient Account Number: [id number] Date of Birth/Sex: Treating RN: 1996/05/14 (27 y.o. Burnadette Pop, Lauren Primary Care Jaydee Ingman: Mina Marble Other Clinician: Referring Janiece Scovill: Treating Torey Reinard/Extender: Olivia Mackie Weeks in Treatment:  4 Encounter Discharge Information Items Post Procedure Vitals Discharge Condition: Stable Temperature (F): 98.7 Ambulatory Status: Ambulatory Pulse (bpm): 74 Discharge Destination: Home Respiratory Rate (breaths/min): 17 Transportation: Private Auto Blood Pressure (mmHg): 138/84 Accompanied By: self Schedule Follow-up Appointment: Yes Clinical Summary of Care: Patient Declined Electronic Signature(s) Signed: 01/25/2023 8:21:34 AM By: Rhae Hammock RN Entered By: Rhae Hammock on 01/24/2023 10:49:19 [name] ([id number]) [id number] Page 2 of 10 -------------------------------------------------------------------------------- Lower Extremity Assessment Details Patient Name: Date of Service: [name], [name] 01/24/2023 10:15 A M Medical Record Number: [id number] Patient Account Number: [id number] Date of Birth/Sex: Treating RN: 1996-11-05 (27 y.o. M) Primary Care Jonnae Fonseca: Mina Marble Other Clinician: Referring Bricia Taher: Treating Mckenzie Bove/Extender: Olivia Mackie Weeks in Treatment: 4 Edema Assessment Assessed: [Left: No] [Right: No] Edema: [Left: No] [Right: No] Calf Left: Right: Point of Measurement: 34 cm From Medial Instep 37 cm 36.5 cm Ankle Left: Right: Point of Measurement: 9 cm From Medial Instep 23 cm 22 cm Electronic Signature(s) Signed: 01/25/2023 12:35:45 PM By: Erenest Blank Entered By: Erenest Blank on 01/24/2023 10:10:13 -------------------------------------------------------------------------------- Multi Wound Chart Details Patient Name: Date of Service: [name] 01/24/2023 10:15 A M Medical Record Number: [id number] Patient Account Number: [id number] Date of Birth/Sex: Treating RN: 1996/08/27 (27 y.o. M) Primary Care Avis Mcmahill: Mina Marble Other Clinician: Referring Sequoia Witz: Treating Keidrick Murty/Extender: Olivia Mackie Weeks in Treatment: 4 Vital  Signs Height(in): Capillary Blood Glucose(mg/dl): 117 Weight(lbs): Pulse(bpm): 90 Body Mass Index(BMI): Blood Pressure(mmHg): 132/84 Temperature(F): 97.9 Respiratory Rate(breaths/min): 18 [1:Photos:] [N/A:N/A] Right Metatarsal head fifth Left Metatarsal head fifth N/A Wound Location: Gradually Appeared Gradually Appeared N/A Wounding Event: Diabetic Wound/Ulcer of the Lower Diabetic Wound/Ulcer of the Lower N/A Primary Etiology: Extremity Extremity Hypertension, Type I Diabetes, Hypertension, Type I Diabetes, N/A Comorbid History: Neuropathy Neuropathy [name], [name] ([id number]) [phone].pdf Page 3 of 10 12/25/2019 09/24/2022 N/A Date Acquired: 4 4 N/A Weeks of Treatment: Open Open N/A Wound Status: No No N/A Wound Recurrence: Yes Yes N/A Pending A mputation on Presentation: 0.2x0.4x0.3 0.6x0.3x0.2 N/A Measurements L  x W x D (cm) 0.063 0.141 N/A A (cm) : rea 0.019 0.028 N/A Volume (cm) : 11.30% -200.00% N/A % Reduction in A rea: -35.70% -100.00% N/A % Reduction in Volume: Grade 2 Grade 2 N/A Classification: Medium Medium N/A Exudate A mount: Serosanguineous Serosanguineous N/A Exudate Type: red, brown red, brown N/A Exudate Color: Distinct, outline attached Distinct, outline attached N/A Wound Margin: Large (67-100%) Large (67-100%) N/A Granulation A mount: Red Red N/A Granulation Quality: None Present (0%) None Present (0%) N/A Necrotic A mount: Fat Layer (Subcutaneous Tissue): Yes Fat Layer (Subcutaneous Tissue): Yes N/A Exposed Structures: Fascia: No Fascia: No Tendon: No Tendon: No Muscle: No Muscle: No Joint: No Joint: No Bone: No Bone: No Small (1-33%) Small (1-33%) N/A Epithelialization: Debridement - Excisional Debridement - Excisional N/A Debridement: Pre-procedure Verification/Time Out 10:42 10:42 N/A Taken: Lidocaine Lidocaine N/A Pain Control: Callus, Subcutaneous, Slough Callus, Subcutaneous,  Slough N/A Tissue Debrided: Skin/Subcutaneous Tissue Skin/Subcutaneous Tissue N/A Level: 0.08 0.18 N/A Debridement A (sq cm): rea Curette Curette N/A Instrument: Minimum Minimum N/A Bleeding: Pressure Pressure N/A Hemostasis A chieved: 0 0 N/A Procedural Pain: 0 0 N/A Post Procedural Pain: Procedure was tolerated well Procedure was tolerated well N/A Debridement Treatment Response: 0.2x0.4x0.3 0.6x0.3x0.2 N/A Post Debridement Measurements L x W x D (cm) 0.019 0.028 N/A Post Debridement Volume: (cm) Callus: Yes Callus: Yes N/A Periwound Skin Texture: Excoriation: No Excoriation: No Induration: No Induration: No Crepitus: No Crepitus: No Rash: No Rash: No Scarring: No Scarring: No Maceration: No Maceration: No N/A Periwound Skin Moisture: Dry/Scaly: No Dry/Scaly: No Atrophie Blanche: No Atrophie Blanche: No N/A Periwound Skin Color: Cyanosis: No Cyanosis: No Ecchymosis: No Ecchymosis: No Erythema: No Erythema: No Hemosiderin Staining: No Hemosiderin Staining: No Mottled: No Mottled: No Pallor: No Pallor: No Rubor: No Rubor: No Debridement Debridement N/A Procedures Performed: Treatment Notes Wound #1 (Metatarsal head fifth) Wound Laterality: Right Cleanser Wound Cleanser Discharge Instruction: Cleanse the wound with wound cleanser prior to applying a clean dressing using gauze sponges, not tissue or cotton balls. Peri-Wound Care Skin Prep Discharge Instruction: Use skin prep as directed Topical Primary Dressing MediHoney Gel, tube 1.5 (oz) Discharge Instruction: Apply to wound bed as instructed [place] [employer] Dressing 2x2 (in/in) Discharge Instruction: ***APPLY OVER THE [place] AND DO NOT PACK THE WOUND.*** Secondary Dressing Optifoam Non-Adhesive Dressing, 4x4 in Discharge Instruction: Apply ONE LAYER OF LEXANDER, TREMBLAY ([id number]) [id number].pdf Page 4 of 10 Woven Gauze Sponges 2x2 in Discharge  Instruction: Apply over primary dressing as directed. Secured With Conforming Stretch Gauze Bandage, Sterile 2x75 (in/in) Discharge Instruction: Secure with stretch gauze as directed. 64M Medipore H Soft Cloth Surgical T ape, 4 x 10 (in/yd) Discharge Instruction: Secure with tape as directed. Compression Wrap Compression Stockings Add-Ons Wound #2 (Metatarsal head fifth) Wound Laterality: Left Cleanser Wound Cleanser Discharge Instruction: Cleanse the wound with wound cleanser prior to applying a clean dressing using gauze sponges, not tissue or cotton balls. Peri-Wound Care Skin Prep Discharge Instruction: Use skin prep as directed Topical Primary Dressing MediHoney Gel, tube 1.5 (oz) Discharge Instruction: Apply to wound bed as instructed [place] 2x2 (in/in) Discharge Instruction: ***APPLY OVER THE [place] AND DO NOT PACK THE WOUND.*** Secondary Dressing Optifoam Non-Adhesive Dressing, 4x4 in Discharge Instruction: Apply ONE LAYER OF FOAM D Woven Gauze Sponges 2x2 in Discharge Instruction: Apply over primary dressing as directed. Secured With Conforming Stretch Gauze Bandage, Sterile 2x75 (in/in) Discharge Instruction: Secure with stretch gauze as directed. 64M Medipore H Soft Cloth Surgical T  ape, 4 x 10 (in/yd) Discharge Instruction: Secure with tape as directed. Compression Wrap Compression Stockings Add-Ons Electronic Signature(s) Signed: 01/24/2023 1:45:23 PM By: Kalman Shan DO Entered By: Kalman Shan on 01/24/2023 11:25:41 -------------------------------------------------------------------------------- Multi-Disciplinary Care Plan Details Patient Name: Date of Service: [name], [name] 01/24/2023 10:15 A M Medical Record Number: [id number] Patient Account Number: [id number] Date of Birth/Sex: Treating RN: November 27, 1996 (27 y.o. Erie Noe Primary Care Karimah Winquist: Mina Marble Other Clinician: Referring Janelly Switalski: Treating  Jhovany Weidinger/Extender: Olivia Mackie Weeks in Treatment: 4 KONGMENG, SANTORO ([id number]) [id number] Page 5 of 10 Active Inactive Pain, Acute or Chronic Nursing Diagnoses: Pain, acute or chronic: actual or potential Potential alteration in comfort, pain Goals: Patient will verbalize adequate pain control and receive pain control interventions during procedures as needed Date Initiated: 12/27/2022 Target Resolution Date: 02/01/2023 Goal Status: Active Patient/caregiver will verbalize comfort level met Date Initiated: 12/27/2022 Target Resolution Date: 01/31/2023 Goal Status: Active Interventions: Complete pain assessment as per visit requirements Encourage patient to take pain medications as prescribed Provide education on pain management Treatment Activities: Administer pain control measures as ordered : 12/27/2022 Notes: Wound/Skin Impairment Nursing Diagnoses: Knowledge deficit related to ulceration/compromised skin integrity Goals: Patient/caregiver will verbalize understanding of skin care regimen Date Initiated: 12/27/2022 Target Resolution Date: 01/31/2023 Goal Status: Active Interventions: Assess patient/caregiver ability to perform ulcer/skin care regimen upon admission and as needed Assess ulceration(s) every visit Provide education on ulcer and skin care Treatment Activities: Skin care regimen initiated : 12/27/2022 Topical wound management initiated : 12/27/2022 Notes: Electronic Signature(s) Signed: 01/25/2023 8:21:34 AM By: Rhae Hammock RN Entered By: Rhae Hammock on 01/24/2023 10:47:21 -------------------------------------------------------------------------------- Pain Assessment Details Patient Name: Date of Service: [name], [name] 01/24/2023 10:15 A M Medical Record Number: [id number] Patient Account Number: [id number] Date of Birth/Sex: Treating RN: 1996/05/18 (27 y.o. M) Primary Care Baily Serpe: Mina Marble  Other Clinician: Referring Rayshard Schirtzinger: Treating Davonte Siebenaler/Extender: Olivia Mackie Weeks in Treatment: 4 Active Problems Location of Pain Severity and Description of Pain Patient Has Paino No Site Locations [name], [name] ([id number]) [id number] Page 6 of 10 Pain Management and Medication Current Pain Management: Electronic Signature(s) Signed: 01/25/2023 12:35:45 PM By: Erenest Blank Entered By: Erenest Blank on 01/24/2023 10:11:28 -------------------------------------------------------------------------------- Patient/Caregiver Education Details Patient Name: Date of Service: [name], [name]. 2/1/2024andnbsp10:15 A M Medical Record Number: [id number] Patient Account Number: [id number] Date of Birth/Gender: Treating RN: 11/13/1996 (27 y.o. Erie Noe Primary Care Physician: Mina Marble Other Clinician: Referring Physician: Treating Physician/Extender: Burns [place] in Treatment: 4 Education Assessment Education Provided To: Patient Education Topics Provided Wound/Skin Impairment: Methods: Explain/Verbal Responses: Reinforcements needed, State content correctly Electronic Signature(s) Signed: 01/25/2023 8:21:34 AM By: Rhae Hammock RN Entered By: Rhae Hammock on 01/24/2023 10:47:34 -------------------------------------------------------------------------------- Wound Assessment Details Patient Name: Date of Service: [name], [name] 01/24/2023 10:15 A M Medical Record Number: [id number] Patient Account Number: [id number] Date of Birth/Sex: Treating RN: 1996/01/29 (27 y.o. M) Primary Care Ritaj Dullea: Mina Marble Other Clinician: Referring Alisandra Son: Treating Aanya Haynes/Extender: Javaun, Dimperio ([id number]) [id number] Page 7 of 10 Weeks in Treatment: 4 Wound Status Wound Number: 1 Primary Etiology: Diabetic  Wound/Ulcer of the Lower Extremity Wound Location: Right Metatarsal head fifth Wound Status: Open Wounding Event: Gradually Appeared Comorbid History: Hypertension, Type I Diabetes, Neuropathy Date Acquired: 12/25/2019 Weeks Of Treatment: 4 Clustered Wound: No Pending Amputation On Presentation Photos Wound Measurements Length: (cm) 0.2 Width: (cm) 0.4 Depth: (cm) 0.3 Area: (cm) 0.063 Volume: (cm) 0.019 % Reduction in  Area: 11.3% % Reduction in Volume: -35.7% Epithelialization: Small (1-33%) Tunneling: No Undermining: No Wound Description Classification: Grade 2 Wound Margin: Distinct, outline attached Exudate Amount: Medium Exudate Type: Serosanguineous Exudate Color: red, brown Foul Odor After Cleansing: No Slough/Fibrino No Wound Bed Granulation Amount: Large (67-100%) Exposed Structure Granulation Quality: Red Fascia Exposed: No Necrotic Amount: None Present (0%) Fat Layer (Subcutaneous Tissue) Exposed: Yes Tendon Exposed: No Muscle Exposed: No Joint Exposed: No Bone Exposed: No Periwound Skin Texture Texture Color No Abnormalities Noted: No No Abnormalities Noted: No Callus: Yes Atrophie Blanche: No Crepitus: No Cyanosis: No Excoriation: No Ecchymosis: No Induration: No Erythema: No Rash: No Hemosiderin Staining: No Scarring: No Mottled: No Pallor: No Moisture Rubor: No No Abnormalities Noted: No Dry / Scaly: No Maceration: No Treatment Notes Wound #1 (Metatarsal head fifth) Wound Laterality: Right Cleanser Wound Cleanser Discharge Instruction: Cleanse the wound with wound cleanser prior to applying a clean dressing using gauze sponges, not tissue or cotton balls. Peri-Wound Care Skin Prep Discharge Instruction: Use skin prep as directed [name], [name] ([id number]) [phone].pdf Page 8 of 10 Topical Primary Dressing MediHoney Gel, tube 1.5 (oz) Discharge Instruction: Apply to wound bed as instructed [place] 2x2 (in/in) Discharge Instruction: ***APPLY OVER THE [place] AND DO NOT PACK THE WOUND.*** Secondary Dressing Optifoam Non-Adhesive Dressing, 4x4 in Discharge Instruction: Apply ONE LAYER OF FOAM D Woven Gauze Sponges 2x2 in Discharge Instruction: Apply over primary dressing as directed. Secured With Conforming Stretch Gauze Bandage, Sterile 2x75 (in/in) Discharge Instruction: Secure with stretch gauze as directed. 74M Medipore H Soft Cloth Surgical T ape, 4 x 10 (in/yd) Discharge Instruction: Secure with tape as directed. Compression Wrap Compression Stockings Add-Ons Electronic Signature(s) Signed: 01/25/2023 12:35:45 PM By: Erenest Blank Entered By: Erenest Blank on 01/24/2023 10:09:30 -------------------------------------------------------------------------------- Wound Assessment Details Patient Name: Date of Service: [name], [name] 01/24/2023 10:15 A M Medical Record Number: [id number] Patient Account Number: [id number] Date of Birth/Sex: Treating RN: 07/29/1996 (27 y.o. M) Primary Care Aquila Menzie: Mina Marble Other Clinician: Referring Breck Hollinger: Treating Ellizabeth Dacruz/Extender: Olivia Mackie Weeks in Treatment: 4 Wound Status Wound Number: 2 Primary Etiology: Diabetic Wound/Ulcer of the Lower Extremity Wound Location: Left Metatarsal head fifth Wound Status: Open Wounding Event: Gradually Appeared Comorbid History: Hypertension, Type I Diabetes, Neuropathy Date Acquired: 09/24/2022 Weeks Of Treatment: 4 Clustered Wound: No Pending Amputation On Presentation Photos Wound Measurements Length: (cm) 0.6 Width: (cm) 0.3 [name], [name] ([id number]) Depth: (cm) 0.2 Area: (cm) 0.141 Volume: (cm) 0.028 % Reduction in Area: -200% % Reduction in Volume: -100% [phone].pdf Page 9 of 10 Epithelialization: Small (1-33%) Tunneling: No Undermining: No Wound Description Classification: Grade 2 Wound Margin:  Distinct, outline attached Exudate Amount: Medium Exudate Type: Serosanguineous Exudate Color: red, brown Foul Odor After Cleansing: No Slough/Fibrino No Wound Bed Granulation Amount: Large (67-100%) Exposed Structure Granulation Quality: Red Fascia Exposed: No Necrotic Amount: None Present (0%) Fat Layer (Subcutaneous Tissue) Exposed: Yes Tendon Exposed: No Muscle Exposed: No Joint Exposed: No Bone Exposed: No Periwound Skin Texture Texture Color No Abnormalities Noted: No No Abnormalities Noted: No Callus: Yes Atrophie Blanche: No Crepitus: No Cyanosis: No Excoriation: No Ecchymosis: No Induration: No Erythema: No Rash: No Hemosiderin Staining: No Scarring: No Mottled: No Pallor: No Moisture Rubor: No No Abnormalities Noted: No Dry / Scaly: No Maceration: No Treatment Notes Wound #2 (Metatarsal head fifth) Wound Laterality: Left Cleanser Wound Cleanser Discharge Instruction: Cleanse the wound with wound cleanser prior to applying a clean dressing using gauze sponges, not tissue  or cotton balls. Peri-Wound Care Skin Prep Discharge Instruction: Use skin prep as directed Topical Primary Dressing MediHoney Gel, tube 1.5 (oz) Discharge Instruction: Apply to wound bed as instructed [place] 2x2 (in/in) Discharge Instruction: ***APPLY OVER THE [place] AND DO NOT PACK THE WOUND.*** Secondary Dressing Optifoam Non-Adhesive Dressing, 4x4 in Discharge Instruction: Apply ONE LAYER OF FOAM D Woven Gauze Sponges 2x2 in Discharge Instruction: Apply over primary dressing as directed. Secured With Conforming Stretch Gauze Bandage, Sterile 2x75 (in/in) Discharge Instruction: Secure with stretch gauze as directed. 70M Medipore H Soft Cloth Surgical T ape, 4 x 10 (in/yd) Discharge Instruction: Secure with tape as directed. Compression Wrap Compression Stockings Add-Ons Electronic Signature(s) BLADYN, TIPPS ([id number])  [id number].pdf Page 10 of 10 Signed: 01/25/2023 12:35:45 PM By: Erenest Blank Entered By: Erenest Blank on 01/24/2023 10:11:14 -------------------------------------------------------------------------------- Vitals Details Patient Name: Date of Service: BRANDI, ARMATO 01/24/2023 10:15 A M Medical Record Number: [id number] Patient Account Number: [id number] Date of Birth/Sex: Treating RN: 1996/06/18 (27 y.o. M) Primary Care Braeden Dolinski: Mina Marble Other Clinician: Referring Chelisa Hennen: Treating [name] [name]/Extender: Olivia Mackie Weeks in Treatment: 4 Vital Signs Time Taken: 10:00 Temperature (F): 97.9 Pulse (bpm): 90 Respiratory Rate (breaths/min): 18 Blood Pressure (mmHg): 132/84 Capillary Blood Glucose (mg/dl): 117 Reference Range: 80 - 120 mg / dl Electronic Signature(s) Signed: 01/25/2023 12:35:45 PM By: Erenest Blank Entered By: Erenest Blank on 01/24/2023 10:12:44

## 2023-01-25 NOTE — Progress Notes (Signed)
SACRAMENTO, MONDS ([id number]) [id number] Page 1 of 10 Visit Report for 01/24/2023 Chief Complaint Document Details Patient Name: Date of Service: [name], [name] 01/24/2023 10:15 A M Medical Record Number: [id number] Patient Account Number: [id number] Date of Birth/Sex: Treating RN: Jun 15, 1996 (27 y.o. M) Primary Care Provider: Mina Marble Other Clinician: Referring Provider: Treating Provider/Extender: Olivia Mackie Weeks in Treatment: 4 Information Obtained from: Patient Chief Complaint 12/27/2022; bilateral diabetic foot wounds Electronic Signature(s) Signed: 01/24/2023 1:45:23 PM By: Kalman Shan DO Entered By: Kalman Shan on 01/24/2023 11:25:49 -------------------------------------------------------------------------------- Debridement Details Patient Name: Date of Service: [name] 01/24/2023 10:15 A M Medical Record Number: [id number] Patient Account Number: [id number] Date of Birth/Sex: Treating RN: 01-13-1996 (27 y.o. Burnadette Pop, Lauren Primary Care Provider: Mina Marble Other Clinician: Referring Provider: Treating Provider/Extender: Olivia Mackie Weeks in Treatment: 4 Debridement Performed for Assessment: Wound #1 Right Metatarsal head fifth Performed By: Physician Kalman Shan, DO Debridement Type: Debridement Severity of Tissue Pre Debridement: Fat layer exposed Level of Consciousness (Pre-procedure): Awake and Alert Pre-procedure Verification/Time Out Yes - 10:42 Taken: Start Time: 10:42 Pain Control: Lidocaine T Area Debrided (L x W): otal 0.2 (cm) x 0.4 (cm) = 0.08 (cm) Tissue and other material debrided: Viable, Non-Viable, Callus, Slough, Subcutaneous, Slough Level: Skin/Subcutaneous Tissue Debridement Description: Excisional Instrument: Curette Bleeding: Minimum Hemostasis Achieved: Pressure End Time: 10:42 Procedural Pain: 0 Post Procedural Pain:  0 Response to Treatment: Procedure was tolerated well Level of Consciousness (Post- Awake and Alert procedure): Post Debridement Measurements of Total Wound Length: (cm) 0.2 Width: (cm) 0.4 Depth: (cm) 0.3 Volume: (cm) 0.019 Character of Wound/Ulcer Post Debridement: Improved Severity of Tissue Post Debridement: Fat layer exposed MARDY, LUCIER E ([id number]) [id number].pdf Page 2 of 10 Post Procedure Diagnosis Same as Pre-procedure Electronic Signature(s) Signed: 01/24/2023 1:45:23 PM By: Kalman Shan DO Signed: 01/25/2023 8:21:34 AM By: Rhae Hammock RN Entered By: Rhae Hammock on 01/24/2023 10:43:29 -------------------------------------------------------------------------------- Debridement Details Patient Name: Date of Service: [name], ANTONY 01/24/2023 10:15 A M Medical Record Number: [id number] Patient Account Number: [id number] Date of Birth/Sex: Treating RN: March 05, 1996 (27 y.o. Burnadette Pop, Lauren Primary Care Provider: Mina Marble Other Clinician: Referring Provider: Treating Provider/Extender: Olivia Mackie Weeks in Treatment: 4 Debridement Performed for Assessment: Wound #2 Left Metatarsal head fifth Performed By: Physician Kalman Shan, DO Debridement Type: Debridement Severity of Tissue Pre Debridement: Fat layer exposed Level of Consciousness (Pre-procedure): Awake and Alert Pre-procedure Verification/Time Out Yes - 10:42 Taken: Start Time: 10:42 Pain Control: Lidocaine T Area Debrided (L x W): otal 0.6 (cm) x 0.3 (cm) = 0.18 (cm) Tissue and other material debrided: Viable, Non-Viable, Callus, Slough, Subcutaneous, Slough Level: Skin/Subcutaneous Tissue Debridement Description: Excisional Instrument: Curette Bleeding: Minimum Hemostasis Achieved: Pressure End Time: 10:42 Procedural Pain: 0 Post Procedural Pain: 0 Response to Treatment: Procedure was tolerated well Level of Consciousness  (Post- Awake and Alert procedure): Post Debridement Measurements of Total Wound Length: (cm) 0.6 Width: (cm) 0.3 Depth: (cm) 0.2 Volume: (cm) 0.028 Character of Wound/Ulcer Post Debridement: Improved Severity of Tissue Post Debridement: Fat layer exposed Post Procedure Diagnosis Same as Pre-procedure Electronic Signature(s) Signed: 01/24/2023 1:45:23 PM By: Kalman Shan DO Signed: 01/25/2023 8:21:34 AM By: Rhae Hammock RN Entered By: Rhae Hammock on 01/24/2023 10:43:59 HPI Details -------------------------------------------------------------------------------- [name] ([id number]) [id number] Page 3 of 10 Patient Name: Date of Service: [name], [name] 01/24/2023 10:15 A M Medical Record Number: [id number] Patient Account Number: [id number] Date of Birth/Sex: Treating RN: 1996-05-20 (27 y.o. M) Primary  Care Provider: Mina Marble Other Clinician: Referring Provider: Treating Provider/Extender: Olivia Mackie Weeks in Treatment: 4 History of Present Illness HPI Description: 12/27/2022 Mr. 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 He has been following with podiatry for this issue. He reports using Silvadene to the areas. Currently not using anything for offloading. He states that the right foot wound has been present for the past 1-1/2 years. He states that the left foot wound appeared about 3 months ago. He has not had recent imaging of the areas. Currently denies systemic signs of infection. 01/03/2023; patient presents for follow-up. He has been using Medihoney and silver alginate to the wound beds. He has been using his surgical shoes with peg assist. He has not been scheduled for an MRI yet. There is been  improvement in wound healing. He currently denies signs of infection. 1/18; patient presents for follow-up. He is scheduled to have his MRI on 1/29. He has been using silver alginate with Medihoney to the wound beds. He has been using his surgical shoes with peg assist. He has no issues or complaints today. He denies signs of infection. 1/25; patient presents for follow-up. He has been using silver alginate with Medihoney to the wound beds. He is starting to develop a new wound to the right lateral foot adjacent to the original right foot wound. He has been using his surgical shoes with peg assist. He currently denies signs of infection. Scheduled for MRI in 4 days. 2/1; patient presents for follow-up. Has been using silver alginate with Medihoney to the wound beds. Unfortunately his insurance denied his MRI a day prior to when it was scheduled. This was reapproved but only to the right foot with a prior Auth. He currently denies signs of infection. Electronic Signature(s) Signed: 01/24/2023 1:45:23 PM By: Kalman Shan DO Entered By: Kalman Shan on 01/24/2023 11:27:04 -------------------------------------------------------------------------------- Physical Exam Details Patient Name: Date of Service: [name], [name] 01/24/2023 10:15 A M Medical Record Number: [id number] Patient Account Number: [id number] Date of Birth/Sex: Treating RN: [birth year] (27 y.o. M) Primary Care Provider: Mina Marble Other Clinician: Referring Provider: Treating Provider/Extender: Olivia Mackie Weeks in Treatment: 4 Constitutional respirations regular, non-labored and within target range for patient.. Cardiovascular 2+ dorsalis pedis/posterior tibialis pulses. Psychiatric pleasant and cooperative. Notes Bilateral fifth met head wounds to the feet. Granulation tissue With nonviable tissue and callus. No signs of infection including increased warmth, erythema or purulent  drainage. Electronic Signature(s) Signed: 01/24/2023 1:45:23 PM By: Kalman Shan DO Entered By: Kalman Shan on 01/24/2023 11:27:29 Physician Orders Details -------------------------------------------------------------------------------- [name] ([id number]) [id number] Page 4 of 10 Patient Name: Date of Service: [name], [name] 01/24/2023 10:15 A M Medical Record Number: [id number] Patient Account Number: [id number] Date of Birth/Sex: Treating RN: 1996/04/04 (27 y.o. Erie Noe Primary Care Provider: Mina Marble Other Clinician: Referring Provider: Treating Provider/Extender: Olivia Mackie Weeks in Treatment: 4 Verbal / Phone Orders: No Diagnosis Coding Follow-up Appointments ppointment in 1 week. - Thursday with Dr. Heber [place] on 01/31/23 @ 1100 Rm # 9 w/ Allayne Butcher Return A ppointment in 2 weeks. - Thursday with Dr. Heber [place] 02/07/23 @ 10:15 w/ Allayne Butcher Rm # 9 Return A Other: - MRI 01/21/2023 ****REMEMBER TO REMOVE THE SILVER AGLINATE, [place] WOUNDS, COVER WITH BANDAGE BEFORE MRI.**** Anesthetic (In clinic) Topical  Lidocaine 4% applied to wound bed Bathing/ Shower/ Hygiene May shower with protection but do not get wound dressing(s) wet. Protect dressing(s) with water repellant cover (for example, large plastic bag) or a cast cover and may then take shower. Off-Loading Open toe surgical shoe to: - both surgical shoes with PEG ASSIST to both shoes. Wear while walking and standing. DO not walk barefoot or just in socks. Additional Orders / Instructions Follow Nutritious Diet - CLOSELY MONITOR BLOOD SUGAR. Wound Treatment Wound #1 - Metatarsal head fifth Wound Laterality: Right Cleanser: Wound Cleanser (Generic) 1 x Per Day/30 Days Discharge Instructions: Cleanse the wound with wound cleanser prior to applying a clean dressing using gauze sponges, not tissue or cotton balls. Peri-Wound Care: Skin Prep (Generic) 1 x  Per Day/30 Days Discharge Instructions: Use skin prep as directed Prim Dressing: MediHoney Gel, tube 1.5 (oz) 1 x Per Day/30 Days ary Discharge Instructions: Apply to wound bed as instructed Prim Dressing: Sorbalgon AG Dressing 2x2 (in/in) (Generic) 1 x Per Day/30 Days ary Discharge Instructions: ***APPLY OVER THE MEDIHONEY AND DO NOT PACK THE WOUND.*** Secondary Dressing: Optifoam Non-Adhesive Dressing, 4x4 in (Generic) 1 x Per Day/30 Days Discharge Instructions: Apply ONE LAYER OF FOAM D Secondary Dressing: Woven Gauze Sponges 2x2 in (Generic) 1 x Per Day/30 Days Discharge Instructions: Apply over primary dressing as directed. Secured With: Child psychotherapist, Sterile 2x75 (in/in) (Generic) 1 x Per Day/30 Days Discharge Instructions: Secure with stretch gauze as directed. Secured With: 52M Medipore H Soft Cloth Surgical T ape, 4 x 10 (in/yd) (Generic) 1 x Per Day/30 Days Discharge Instructions: Secure with tape as directed. Wound #2 - Metatarsal head fifth Wound Laterality: Left Cleanser: Wound Cleanser (Generic) 1 x Per Day/30 Days Discharge Instructions: Cleanse the wound with wound cleanser prior to applying a clean dressing using gauze sponges, not tissue or cotton balls. Peri-Wound Care: Skin Prep (Generic) 1 x Per Day/30 Days Discharge Instructions: Use skin prep as directed Prim Dressing: MediHoney Gel, tube 1.5 (oz) 1 x Per Day/30 Days ary Discharge Instructions: Apply to wound bed as instructed Prim Dressing: Sorbalgon AG Dressing 2x2 (in/in) (Generic) 1 x Per Day/30 Days ary Discharge Instructions: ***APPLY OVER THE MEDIHONEY AND DO NOT PACK THE WOUND.*** Secondary Dressing: Optifoam Non-Adhesive Dressing, 4x4 in (Generic) 1 x Per Day/30 Days Discharge Instructions: Apply ONE LAYER OF FOAM D Secondary Dressing: Woven Gauze Sponges 2x2 in (Generic) 1 x Per Day/30 Days Discharge Instructions: Apply over primary dressing as directed. Secured With: Hotel manager, Sterile 2x75 (in/in) (Generic) 1 x Per Day/30 Days Discharge Instructions: Secure with stretch gauze as directed. TARIUS, STANGELO ([id number]) [id number] Page 5 of 10 Secured With: 52M Medipore H Soft Cloth Surgical T ape, 4 x 10 (in/yd) (Generic) 1 x Per Day/30 Days Discharge Instructions: Secure with tape as directed. Radiology X-ray, foot - left foot Electronic Signature(s) Signed: 01/24/2023 1:45:23 PM By: Kalman Shan DO Entered By: Kalman Shan on 01/24/2023 11:27:37 Prescription 01/24/2023 -------------------------------------------------------------------------------- Alycia Rossetti E. Kalman Shan DO Patient Name: Provider: 1996/08/18 [id number] Date of Birth: NPI#: Jerilynn Mages [id number] Sex: DEA #: [phone] Phone #: License #: [place] Patient Address: [facility] [address] [place], [place] [address] [place], [place] [address] [phone] Allergies amoxicillin; Lexapro Provider's Orders X-ray, foot - left foot Hand Signature: Date(s): Electronic Signature(s) Signed: 01/24/2023 1:45:23 PM By: Kalman Shan DO Entered By: Kalman Shan on 01/24/2023 11:27:37 -------------------------------------------------------------------------------- Problem List  Details Patient Name: Date of Service: [name], [name] 01/24/2023 10:15 A M Medical Record Number: [id number] Patient Account Number: [id number] Date of Birth/Sex: Treating RN: 1996/04/02 (27 y.o. 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 12/27/2022 No Yes severity AUDRIC, [name] ([id number])  [phone].pdf Page 6 of 10 [phone] Non-pressure chronic ulcer of other part of left foot with other specified 12/27/2022 No Yes severity E10.40 Type 1 diabetes mellitus with diabetic neuropathy, unspecified 12/27/2022 No Yes E10.621 Type 1 diabetes mellitus with foot ulcer 12/27/2022 No Yes Inactive Problems Resolved Problems Electronic Signature(s) Signed: 01/24/2023 1:45:23 PM By: Kalman Shan DO Entered By: Kalman Shan on 01/24/2023 11:25:35 -------------------------------------------------------------------------------- Progress Note Details Patient Name: Date of Service: [name] 01/24/2023 10:15 A M Medical Record Number: [id number] Patient Account Number: [id number] Date of Birth/Sex: Treating RN: 25-Jan-1996 (27 y.o. M) Primary Care Provider: Mina Marble Other Clinician: Referring Provider: Treating Provider/Extender: Olivia Mackie Weeks in Treatment: 4 Subjective Chief Complaint Information obtained from Patient 12/27/2022; bilateral diabetic foot wounds History of Present Illness (HPI) 12/27/2022 Mr. 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 He has been following with podiatry for this issue. He reports using Silvadene to the areas. Currently not using anything for offloading. He states that the right foot wound has been present for the past 1-1/2 years. He states that the left foot wound appeared about 3 months ago. He has not had recent imaging of the areas. Currently denies systemic signs of infection. 01/03/2023; patient presents for follow-up. He has been using Medihoney and silver alginate to the wound beds. He has been using his surgical shoes with peg assist. He has not been scheduled for an MRI yet. There is been  improvement in wound healing. He currently denies signs of infection. 1/18; patient presents for follow-up. He is scheduled to have his MRI on 1/29. He has been using silver alginate with Medihoney to the wound beds. He has been using his surgical shoes with peg assist. He has no issues or complaints today. He denies signs of infection. 1/25; patient presents for follow-up. He has been using silver alginate with Medihoney to the wound beds. He is starting to develop a new wound to the right lateral foot adjacent to the original right foot wound. He has been using his surgical shoes with peg assist. He currently denies signs of infection. Scheduled for MRI in 4 days. 2/1; patient presents for follow-up. Has been using silver alginate with Medihoney to the wound beds. Unfortunately his insurance denied his MRI a day prior to when it was scheduled. This was reapproved but only to the right foot with a prior Auth. He currently denies signs of infection. Patient History Information obtained from Patient, Chart. Family History Unknown History. Social History Never smoker, Marital Status - Single, Alcohol Use - Never, Drug Use - No History, Caffeine Use - Rarely. Medical History Cardiovascular Patient has history of Hypertension BADR, PIEDRA ([id number]) [phone].pdf Page 7 of 10 Endocrine Patient has history of Type I Diabetes Denies history of Type II Diabetes Neurologic Patient has history of Neuropathy Hospitalization/Surgery History - R foot  wound debridement and closure. 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 Medical A Surgical History Notes nd Cardiovascular heart murmur Objective Constitutional respirations regular, non-labored and within target range for patient.. Vitals Time Taken: 10:00 AM, Temperature: 97.9 F, Pulse: 90 bpm, Respiratory Rate: 18 breaths/min, Blood Pressure: 132/84 mmHg, Capillary Blood Glucose: 117 mg/dl. Cardiovascular 2+ dorsalis  pedis/posterior tibialis pulses. Psychiatric pleasant and cooperative. General Notes: Bilateral fifth met head wounds to the feet. Granulation tissue With nonviable tissue and callus. No signs of infection including increased warmth, erythema or purulent drainage. Integumentary (Hair, Skin) Wound #1 status is Open. Original cause of wound was Gradually Appeared. The date acquired was: 12/25/2019. The wound has been in treatment 4 weeks. The wound is located on the Right Metatarsal head fifth. The wound measures 0.2cm length x 0.4cm width x 0.3cm depth; 0.063cm^2 area and 0.019cm^3 volume. There is Fat Layer (Subcutaneous Tissue) exposed. There is no tunneling or undermining noted. There is a medium amount of serosanguineous drainage noted. The wound margin is distinct with the outline attached to the wound base. There is large (67-100%) red granulation within the wound bed. There is no necrotic tissue within the wound bed. The periwound skin appearance exhibited: Callus. The periwound skin appearance did not exhibit: Crepitus, Excoriation, Induration, Rash, Scarring, Dry/Scaly, Maceration, Atrophie Blanche, Cyanosis, Ecchymosis, Hemosiderin Staining, Mottled, Pallor, Rubor, Erythema. Wound #2 status is Open. Original cause of wound was Gradually Appeared. The date acquired was: 09/24/2022. The wound has been in treatment 4 weeks. The wound is located on the Left Metatarsal head fifth. The wound measures 0.6cm length x 0.3cm width x 0.2cm depth; 0.141cm^2 area and 0.028cm^3 volume. There is Fat Layer (Subcutaneous Tissue) exposed. There is no tunneling or undermining noted. There is a medium amount of serosanguineous drainage noted. The wound margin is distinct with the outline attached to the wound base. There is large (67-100%) red granulation within the wound bed. There is no necrotic tissue within the wound bed. The periwound skin appearance exhibited: Callus. The periwound skin appearance did not  exhibit: Crepitus, Excoriation, Induration, Rash, Scarring, Dry/Scaly, Maceration, Atrophie Blanche, Cyanosis, Ecchymosis, Hemosiderin Staining, Mottled, Pallor, Rubor, Erythema. 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 I debrided nonviable tissue. I recommended continuing silver alginate with Medihoney to the wound beds. He knows to take this off before having his MRI. Unfortunately his insurance did not approve the MRI but after prior Auth they have approved his MRI to the right foot. We gave the patient the number to call to schedule. We will go ahead and get a left foot x-ray. Procedures Wound #1 Pre-procedure diagnosis of Wound #1 is a Diabetic Wound/Ulcer of the Lower Extremity located on the Right Metatarsal head fifth .Severity of Tissue Pre Debridement is: Fat layer exposed. There was a Excisional Skin/Subcutaneous Tissue Debridement with a total area of 0.08 sq cm performed by Kalman Shan, DO. With the following instrument(s): Curette to remove Viable and Non-Viable tissue/material. Material removed includes Callus, Subcutaneous Tissue, and Slough after achieving pain control using Lidocaine. No specimens were taken. A time out was conducted at 10:42, prior to the start of the procedure. A Minimum amount of bleeding was controlled with Pressure. The procedure was tolerated well with a pain level of 0 throughout and  a pain level of 0 following the procedure. Post Debridement Measurements: 0.2cm length x 0.4cm width x 0.3cm depth; 0.019cm^3 volume. [name], [name] ([id number]) [id number] Page 8 of 10 Character of Wound/Ulcer Post Debridement is improved. Severity  of Tissue Post Debridement is: Fat layer exposed. Post procedure Diagnosis Wound #1: Same as Pre-Procedure Wound #2 Pre-procedure diagnosis of Wound #2 is a Diabetic Wound/Ulcer of the Lower Extremity located on the Left Metatarsal head fifth .Severity of Tissue Pre Debridement is: Fat layer exposed. There was a Excisional Skin/Subcutaneous Tissue Debridement with a total area of 0.18 sq cm performed by Kalman Shan, DO. With the following instrument(s): Curette to remove Viable and Non-Viable tissue/material. Material removed includes Callus, Subcutaneous Tissue, and Slough after achieving pain control using Lidocaine. No specimens were taken. A time out was conducted at 10:42, prior to the start of the procedure. A Minimum amount of bleeding was controlled with Pressure. The procedure was tolerated well with a pain level of 0 throughout and a pain level of 0 following the procedure. Post Debridement Measurements: 0.6cm length x 0.3cm width x 0.2cm depth; 0.028cm^3 volume. Character of Wound/Ulcer Post Debridement is improved. Severity of Tissue Post Debridement is: Fat layer exposed. Post procedure Diagnosis Wound #2: Same as Pre-Procedure Plan Follow-up Appointments: Return Appointment in 1 week. - Thursday with Dr. Heber [place] on 01/31/23 @ 1100 Rm # 9 w/ [place] Return Appointment in 2 weeks. - Thursday with Dr. Heber [place] 02/07/23 @ 10:15 w/ Allayne Butcher Rm # 9 Other: - MRI 01/21/2023 ****REMEMBER TO REMOVE THE SILVER AGLINATE, [place] WOUNDS, COVER WITH BANDAGE BEFORE MRI.**** Anesthetic: (In clinic) Topical Lidocaine 4% applied to wound bed Bathing/ Shower/ Hygiene: May shower with protection but do not get wound dressing(s) wet. Protect dressing(s) with water repellant cover (for example, large plastic bag) or a cast cover and may then take shower. Off-Loading: Open toe surgical shoe to: - both surgical shoes with PEG ASSIST to both shoes. Wear while walking and standing. DO not walk barefoot or  just in socks. Additional Orders / Instructions: Follow Nutritious Diet - CLOSELY MONITOR BLOOD SUGAR. Radiology ordered were: X-ray, foot - left foot WOUND #1: - Metatarsal head fifth Wound Laterality: Right Cleanser: Wound Cleanser (Generic) 1 x Per Day/30 Days Discharge Instructions: Cleanse the wound with wound cleanser prior to applying a clean dressing using gauze sponges, not tissue or cotton balls. Peri-Wound Care: Skin Prep (Generic) 1 x Per Day/30 Days Discharge Instructions: Use skin prep as directed Prim Dressing: MediHoney Gel, tube 1.5 (oz) 1 x Per Day/30 Days ary Discharge Instructions: Apply to wound bed as instructed Prim Dressing: Sorbalgon AG Dressing 2x2 (in/in) (Generic) 1 x Per Day/30 Days ary Discharge Instructions: ***APPLY OVER THE MEDIHONEY AND DO NOT PACK THE WOUND.*** Secondary Dressing: Optifoam Non-Adhesive Dressing, 4x4 in (Generic) 1 x Per Day/30 Days Discharge Instructions: Apply ONE LAYER OF FOAM D Secondary Dressing: Woven Gauze Sponges 2x2 in (Generic) 1 x Per Day/30 Days Discharge Instructions: Apply over primary dressing as directed. Secured With: Child psychotherapist, Sterile 2x75 (in/in) (Generic) 1 x Per Day/30 Days Discharge Instructions: Secure with stretch gauze as directed. Secured With: 38M Medipore H Soft Cloth Surgical T ape, 4 x 10 (in/yd) (Generic) 1 x Per Day/30 Days Discharge Instructions: Secure with tape as directed. WOUND #2: - Metatarsal head fifth Wound Laterality: Left Cleanser: Wound Cleanser (Generic) 1 x Per Day/30 Days Discharge Instructions: Cleanse the wound with wound cleanser prior to applying a clean dressing using gauze sponges, not tissue  or cotton balls. Peri-Wound Care: Skin Prep (Generic) 1 x Per Day/30 Days Discharge Instructions: Use skin prep as directed Prim Dressing: MediHoney Gel, tube 1.5 (oz) 1 x Per Day/30 Days ary Discharge Instructions: Apply to wound bed as instructed Prim Dressing:  [place] AG Dressing 2x2 (in/in) (Generic) 1 x Per Day/30 Days ary Discharge Instructions: ***APPLY OVER THE MEDIHONEY AND DO NOT PACK THE WOUND.*** Secondary Dressing: Optifoam Non-Adhesive Dressing, 4x4 in (Generic) 1 x Per Day/30 Days Discharge Instructions: Apply ONE LAYER OF FOAM D Secondary Dressing: Woven Gauze Sponges 2x2 in (Generic) 1 x Per Day/30 Days Discharge Instructions: Apply over primary dressing as directed. Secured With: Child psychotherapist, Sterile 2x75 (in/in) (Generic) 1 x Per Day/30 Days Discharge Instructions: Secure with stretch gauze as directed. Secured With: 82M Medipore H Soft Cloth Surgical T ape, 4 x 10 (in/yd) (Generic) 1 x Per Day/30 Days Discharge Instructions: Secure with tape as directed. 1. In office sharp debridement 2. Medihoney and silver alginate 3. Aggressive offloadingoosurgical shoe and peg assist 4. Left foot x-ray 5. MRI of the right foot Electronic Signature(s) Signed: 01/24/2023 1:45:23 PM By: Kalman Shan DO Entered By: Kalman Shan on 01/24/2023 11:29:21 [name], [name] ([id number]) [id number] Page 9 of 10 -------------------------------------------------------------------------------- HxROS Details Patient Name: Date of Service: [name], [name] 01/24/2023 10:15 A M Medical Record Number: [id number] Patient Account Number: [id number] Date of Birth/Sex: Treating RN: 11-07-1996 (27 y.o. M) Primary Care Provider: Mina Marble Other Clinician: Referring Provider: Treating Provider/Extender: Olivia Mackie Weeks in Treatment: 4 Information Obtained From Patient Chart Cardiovascular Medical History: Positive for: Hypertension Past Medical History Notes: heart murmur Endocrine Medical History: Positive for: Type I Diabetes Negative for: Type II Diabetes Time with diabetes: 1 year ago Treated with: Insulin, Diet Blood sugar tested every day: Yes Tested :  TID Neurologic Medical History: Positive for: Neuropathy Immunizations Pneumococcal Vaccine: Received Pneumococcal Vaccination: No Implantable Devices None Hospitalization / Surgery History Type of Hospitalization/Surgery 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 Family and Social History Unknown History: Yes; Never smoker; Marital Status - Single; Alcohol Use: Never; Drug Use: No History; Caffeine Use: Rarely; Financial Concerns: No; Food, Clothing or Shelter Needs: No; Support System Lacking: No; Transportation Concerns: No Electronic Signature(s) Signed: 01/24/2023 1:45:23 PM By: Kalman Shan DO Entered By: Kalman Shan on 01/24/2023 11:27:09 -------------------------------------------------------------------------------- SuperBill Details Patient Name: Date of Service: [name], [name] 01/24/2023 Medical Record Number: [id number] Patient Account Number: [id number] Date of Birth/Sex: Treating RN: 04-08-1996 (27 y.o. 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 E10.40 Type 1 diabetes mellitus with diabetic neuropathy, unspecified E10.621 Type 1 diabetes mellitus with foot ulcer Facility Procedures : CPT4 Code: [id number] Description: 71062 - DEB SUBQ TISSUE 20 SQ CM/< ICD-10 Diagnosis Description L97.518 Non-pressure chronic ulcer of other part of right foot with other specified sev L97.528 Non-pressure chronic ulcer of other part of left foot with other specified seve  E10.621 Type 1 diabetes mellitus with  foot ulcer Modifier: erity rity Quantity: 1 Physician Procedures : CPT4 Code Description Modifier [id number] 11042 - WC PHYS SUBQ TISS 20 SQ CM ICD-10 Diagnosis Description L97.518 Non-pressure chronic ulcer of other  part of right foot with other specified severity L97.528 Non-pressure chronic ulcer of other part of left  foot with other specified severity E10.621 Type 1 diabetes mellitus with foot ulcer Quantity: 1 Electronic Signature(s) Signed: 01/24/2023 1:45:23 PM By: Kalman Shan DO Entered By: Kalman Shan on 01/24/2023 11:31:10

## 2023-01-29 DIAGNOSIS — E108 Type 1 diabetes mellitus with unspecified complications: Secondary | ICD-10-CM | POA: Diagnosis not present

## 2023-01-29 DIAGNOSIS — R000 Tachycardia, unspecified: Secondary | ICD-10-CM | POA: Diagnosis not present

## 2023-01-29 DIAGNOSIS — E1140 Type 2 diabetes mellitus with diabetic neuropathy, unspecified: Secondary | ICD-10-CM | POA: Diagnosis not present

## 2023-01-29 DIAGNOSIS — K219 Gastro-esophageal reflux disease without esophagitis: Secondary | ICD-10-CM | POA: Diagnosis not present

## 2023-01-30 ENCOUNTER — Ambulatory Visit
Admission: RE | Admit: 2023-01-30 | Discharge: 2023-01-30 | Disposition: A | Discharge status: Home / Self Care | Payer: BC Managed Care – PPO | Source: Ambulatory Visit | Attending: Internal Medicine | Admitting: Internal Medicine

## 2023-01-30 ENCOUNTER — Other Ambulatory Visit: Payer: Self-pay | Admitting: Internal Medicine

## 2023-01-30 DIAGNOSIS — E11621 Type 2 diabetes mellitus with foot ulcer: Secondary | ICD-10-CM

## 2023-01-30 DIAGNOSIS — M7989 Other specified soft tissue disorders: Secondary | ICD-10-CM | POA: Diagnosis not present

## 2023-01-30 DIAGNOSIS — S98921A Partial traumatic amputation of right foot, level unspecified, initial encounter: Secondary | ICD-10-CM | POA: Diagnosis not present

## 2023-01-30 DIAGNOSIS — L97528 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ty: Secondary | ICD-10-CM

## 2023-01-30 MED ORDER — GADOPICLENOL 0.5 MMOL/ML IV SOLN
10.0000 mL | Freq: Once | INTRAVENOUS | Status: AC | PRN
  Administered 2023-01-30: 10 mL via INTRAVENOUS

## 2023-01-31 ENCOUNTER — Encounter (HOSPITAL_BASED_OUTPATIENT_CLINIC_OR_DEPARTMENT_OTHER): Payer: BC Managed Care – PPO | Admitting: Internal Medicine

## 2023-01-31 DIAGNOSIS — E10621 Type 1 diabetes mellitus with foot ulcer: Secondary | ICD-10-CM

## 2023-01-31 DIAGNOSIS — L97528 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ty: Secondary | ICD-10-CM | POA: Diagnosis not present

## 2023-01-31 DIAGNOSIS — L97518 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ity: Secondary | ICD-10-CM | POA: Diagnosis not present

## 2023-01-31 DIAGNOSIS — Z794 Long term (current) use of insulin: Secondary | ICD-10-CM | POA: Diagnosis not present

## 2023-01-31 DIAGNOSIS — E1040 Type 1 diabetes mellitus with diabetic neuropathy, unspecified: Secondary | ICD-10-CM | POA: Diagnosis not present

## 2023-01-31 DIAGNOSIS — I10 Essential (primary) hypertension: Secondary | ICD-10-CM | POA: Diagnosis not present

## 2023-02-01 NOTE — Progress Notes (Signed)
GREYSYN, WEIDINGER ([id number]) [phone].pdf Page 1 of 10 Visit Report for 01/31/2023 Arrival Information Details Patient Name: Date of Service: [name], [name] 01/31/2023 11:00 A M Medical Record Number: [id number] Patient Account Number: [id number] Date of Birth/Sex: Treating RN: [birth year] (27 y.o. Hessie Diener Primary Care Shavontae Gibeault: Mina Marble Other Clinician: Referring Aspyn Warnke: Treating Yacine Droz/Extender: Olivia Mackie Weeks in Treatment: 5 Visit Information History Since Last Visit All ordered tests and consults were completed: Yes Patient Arrived: Ambulatory Added or deleted any medications: No Arrival Time: 10:26 Any new allergies or adverse reactions: No Accompanied By: self Had a fall or experienced change in No Transfer Assistance: None activities of daily living that may affect Patient Identification Verified: Yes risk of falls: Secondary Verification Process Completed: Yes Signs or symptoms of abuse/neglect since No Patient Requires Transmission-Based Precautions: Yes last visito Transmission-Based Precautions: Contact MRSA Hospitalized since last visit: No Patient Has Alerts: No Implantable device outside of the clinic No excluding cellular tissue based products placed in the center since last visit: Has Dressing in Place as Prescribed: Yes Has Footwear/Offloading in Place as Yes Prescribed: Left: Surgical Shoe with Pressure Relief Insole Right: Surgical Shoe with Pressure Relief Insole Pain Present Now: No Electronic Signature(s) Signed: 01/31/2023 5:24:12 PM By: Deon Pilling RN, BSN Entered By: Deon Pilling on 01/31/2023 10:26:49 -------------------------------------------------------------------------------- Encounter Discharge Information Details Patient Name: Date of Service: [name] 01/31/2023 11:00 A M Medical Record Number: [id number] Patient Account Number: [id number] Date of  Birth/Sex: Treating RN: [birth year] (27 y.o. Hessie Diener Primary Care Shereese Bonnie: Mina Marble Other Clinician: Referring Tangee Marszalek: Treating Chaunice Obie/Extender: Olivia Mackie Weeks in Treatment: 5 Encounter Discharge Information Items Post Procedure Vitals Discharge Condition: Stable Temperature (F): 98 Ambulatory Status: Ambulatory Pulse (bpm): 91 Discharge Destination: Home Respiratory Rate (breaths/min): 20 Transportation: Private Auto Blood Pressure (mmHg): 130/82 Accompanied By: self Schedule Follow-up Appointment: Yes Clinical Summary of Care: Electronic Signature(s) Signed: 01/31/2023 5:24:12 PM By: Deon Pilling RN, BSN Entered By: Deon Pilling on 01/31/2023 11:02:56 [name] ([id number].pdf Page 2 of 10 -------------------------------------------------------------------------------- Lower Extremity Assessment Details Patient Name: Date of Service: [name], [name] 01/31/2023 11:00 A M Medical Record Number: [id number] Patient Account Number: [id number] Date of Birth/Sex: Treating RN: 06-10-1996 (27 y.o. Hessie Diener Primary Care Dontae Minerva: Mina Marble Other Clinician: Referring Adalynd Donahoe: Treating Lawanna Cecere/Extender: Olivia Mackie Weeks in Treatment: 5 Edema Assessment Assessed: [Left: Yes] [Right: Yes] Edema: [Left: No] [Right: No] Calf Left: Right: Point of Measurement: 34 cm From Medial Instep 37 cm 37 cm Ankle Left: Right: Point of Measurement: 9 cm From Medial Instep 21 cm 21 cm Vascular Assessment Pulses: Dorsalis Pedis Palpable: [Left:Yes] [Right:Yes] Electronic Signature(s) Signed: 01/31/2023 5:24:12 PM By: Deon Pilling RN, BSN Entered By: Deon Pilling on 01/31/2023 10:29:57 -------------------------------------------------------------------------------- Multi Wound Chart Details Patient Name: Date of Service: [name] 01/31/2023 11:00 A M Medical  Record Number: [id number] Patient Account Number: [id number] Date of Birth/Sex: Treating RN: May 04, 1996 (27 y.o. M) Primary Care Aleta Manternach: Mina Marble Other Clinician: Referring Abbigaile Rockman: Treating Bing Duffey/Extender: Olivia Mackie Weeks in Treatment: 5 Vital Signs Height(in): Capillary Blood Glucose(mg/dl): 128 Weight(lbs): Pulse(bpm): 91 Body Mass Index(BMI): Blood Pressure(mmHg): 130/82 Temperature(F): 98 Respiratory Rate(breaths/min): 20 [1:Photos:] [N/A:N/A [id number].pdf Page 3 of 10] Right Metatarsal head fifth Left Metatarsal head fifth N/A Wound Location: Gradually Appeared Gradually Appeared N/A Wounding Event: Diabetic Wound/Ulcer of the Lower Diabetic Wound/Ulcer of the Lower N/A Primary Etiology: Extremity Extremity Hypertension, Type I Diabetes, Hypertension, Type I  Diabetes, N/A Comorbid History: Neuropathy Neuropathy 12/25/2019 09/24/2022 N/A Date Acquired: 5 5 N/A Weeks of Treatment: Open Open N/A Wound Status: No No N/A Wound Recurrence: Yes Yes N/A Pending A mputation on Presentation: 0.3x0.3x0.5 0.3x0.8x0.2 N/A Measurements L x W x D (cm) 0.071 0.188 N/A A (cm) : rea 0.035 0.038 N/A Volume (cm) : 0.00% -300.00% N/A % Reduction in A rea: -150.00% -171.40% N/A % Reduction in Volume: 12 Starting Position 1 (o'clock): 12 Ending Position 1 (o'clock): 0.5 Maximum Distance 1 (cm): Yes No N/A Undermining: Grade 2 Grade 2 N/A Classification: Medium Medium N/A Exudate A mount: Serosanguineous Serosanguineous N/A Exudate Type: red, brown red, brown N/A Exudate Color: Distinct, outline attached Distinct, outline attached N/A Wound Margin: Large (67-100%) Large (67-100%) N/A Granulation A mount: Red Red N/A Granulation Quality: None Present (0%) None Present (0%) N/A Necrotic A mount: Fat Layer (Subcutaneous Tissue): Yes Fat Layer (Subcutaneous Tissue): Yes N/A Exposed Structures: Fascia:  No Fascia: No Tendon: No Tendon: No Muscle: No Muscle: No Joint: No Joint: No Bone: No Bone: No Small (1-33%) Small (1-33%) N/A Epithelialization: Debridement - Excisional Debridement - Excisional N/A Debridement: Pre-procedure Verification/Time Out 10:48 10:48 N/A Taken: Lidocaine 4% Topical Solution Lidocaine 4% Topical Solution N/A Pain Control: Callus, Subcutaneous, Slough Callus, Subcutaneous, Slough N/A Tissue Debrided: Skin/Subcutaneous Tissue Skin/Subcutaneous Tissue N/A Level: 4 4 N/A Debridement A (sq cm): rea Curette Curette N/A Instrument: Minimum Minimum N/A Bleeding: Pressure Pressure N/A Hemostasis A chieved: 0 0 N/A Procedural Pain: 0 0 N/A Post Procedural Pain: Procedure was tolerated well Procedure was tolerated well N/A Debridement Treatment Response: 0.3x0.3x0.4 0.4x0.8x0.2 N/A Post Debridement Measurements L x W x D (cm) 0.028 0.05 N/A Post Debridement Volume: (cm) Callus: Yes Callus: Yes N/A Periwound Skin Texture: Excoriation: No Excoriation: No Induration: No Induration: No Crepitus: No Crepitus: No Rash: No Rash: No Scarring: No Scarring: No Maceration: No Dry/Scaly: Yes N/A Periwound Skin Moisture: Dry/Scaly: No Maceration: No Atrophie Blanche: No Atrophie Blanche: No N/A Periwound Skin Color: Cyanosis: No Cyanosis: No Ecchymosis: No Ecchymosis: No Erythema: No Erythema: No Hemosiderin Staining: No Hemosiderin Staining: No Mottled: No Mottled: No Pallor: No Pallor: No Rubor: No Rubor: No Debridement Debridement N/A Procedures Performed: Treatment Notes Wound #1 (Metatarsal head fifth) Wound Laterality: Right Cleanser Wound Cleanser Discharge Instruction: Cleanse the wound with wound cleanser prior to applying a clean dressing using gauze sponges, not tissue or cotton balls. Peri-Wound Care Skin Prep Discharge Instruction: Use skin prep as directed DRYSTAN, BOUSE ([id number])  [id number].pdf Page 4 of 10 Topical Primary Dressing MediHoney Gel, tube 1.5 (oz) Discharge Instruction: Apply to wound bed as instructed Sorbalgon AG Dressing 2x2 (in/in) Discharge Instruction: lightly pack into wound bed. Secondary Dressing Optifoam Non-Adhesive Dressing, 4x4 in Discharge Instruction: Apply ONE LAYER OF FOAM D Woven Gauze Sponges 2x2 in Discharge Instruction: Apply over primary dressing as directed. Secured With Conforming Stretch Gauze Bandage, Sterile 2x75 (in/in) Discharge Instruction: Secure with stretch gauze as directed. 61M Medipore H Soft Cloth Surgical T ape, 4 x 10 (in/yd) Discharge Instruction: Secure with tape as directed. Compression Wrap Compression Stockings Add-Ons Wound #2 (Metatarsal head fifth) Wound Laterality: Left Cleanser Wound Cleanser Discharge Instruction: Cleanse the wound with wound cleanser prior to applying a clean dressing using gauze sponges, not tissue or cotton balls. Peri-Wound Care Skin Prep Discharge Instruction: Use skin prep as directed Topical Primary Dressing MediHoney Gel, tube 1.5 (oz) Discharge Instruction: Apply to wound bed as instructed [place] 2x2 (in/in) Discharge Instruction: apply to  wound bed. Secondary Dressing Optifoam Non-Adhesive Dressing, 4x4 in Discharge Instruction: Apply ONE LAYER OF FOAM D Woven Gauze Sponges 2x2 in Discharge Instruction: Apply over primary dressing as directed. Secured With Conforming Stretch Gauze Bandage, Sterile 2x75 (in/in) Discharge Instruction: Secure with stretch gauze as directed. 67M Medipore H Soft Cloth Surgical T ape, 4 x 10 (in/yd) Discharge Instruction: Secure with tape as directed. Compression Wrap Compression Stockings Add-Ons Electronic Signature(s) Signed: 01/31/2023 3:04:17 PM By: Kalman Shan DO Entered By: Kalman Shan on 01/31/2023 12:00:57 [name] ([id number]  Page 5 of 10 -------------------------------------------------------------------------------- Multi-Disciplinary Care Plan Details Patient Name: Date of Service: RAYANSH, BOISE 01/31/2023 11:00 A M Medical Record Number: [id number] Patient Account Number: [id number] Date of Birth/Sex: Treating RN: Dec 27, 1995 (27 y.o. Hessie Diener Primary Care Jacon Whetzel: Mina Marble Other Clinician: Referring Chike Farrington: Treating Angus Amini/Extender: Olivia Mackie Weeks in Treatment: 5 Active Inactive Pain, Acute or Chronic Nursing Diagnoses: Pain, acute or chronic: actual or potential Potential alteration in comfort, pain Goals: Patient will verbalize adequate pain control and receive pain control interventions during procedures as needed Date Initiated: 12/27/2022 Target Resolution Date: 02/22/2023 Goal Status: Active Patient/caregiver will verbalize comfort level met Date Initiated: 12/27/2022 Target Resolution Date: 02/22/2023 Goal Status: Active Interventions: Complete pain assessment as per visit requirements Encourage patient to take pain medications as prescribed Provide education on pain management Treatment Activities: Administer pain control measures as ordered : 12/27/2022 Notes: Wound/Skin Impairment Nursing Diagnoses: Knowledge deficit related to ulceration/compromised skin integrity Goals: Patient/caregiver will verbalize understanding of skin care regimen Date Initiated: 12/27/2022 Target Resolution Date: 02/22/2023 Goal Status: Active Interventions: Assess patient/caregiver ability to perform ulcer/skin care regimen upon admission and as needed Assess ulceration(s) every visit Provide education on ulcer and skin care Treatment Activities: Skin care regimen initiated : 12/27/2022 Topical wound management initiated : 12/27/2022 Notes: Electronic Signature(s) Signed: 01/31/2023 5:24:12 PM By: Deon Pilling RN, BSN Entered By: Deon Pilling on 01/31/2023  10:34:41 [name] ([id number] Page 6 of 10 -------------------------------------------------------------------------------- Pain Assessment Details Patient Name: Date of Service: TEON, BACHELLER 01/31/2023 11:00 A M Medical Record Number: [id number] Patient Account Number: [id number] Date of Birth/Sex: Treating RN: [birth year] (27 y.o. Hessie Diener Primary Care Nyjae Hodge: Mina Marble Other Clinician: Referring Kyllie Pettijohn: Treating Md Smola/Extender: Olivia Mackie Weeks in Treatment: 5 Active Problems Location of Pain Severity and Description of Pain Patient Has Paino No Site Locations Rate the pain. Current Pain Level: 0 Pain Management and Medication Current Pain Management: Medication: No Cold Application: No Rest: No Massage: No Activity: No T.BrownN.S.: No Heat Application: No Leg drop or elevation: No Is the Current Pain Management Adequate: Adequate How does your wound impact your activities of daily livingo Sleep: No Bathing: No Appetite: No Relationship With Others: No Bladder Continence: No Emotions: No Bowel Continence: No Work: No Toileting: No Drive: No Dressing: No Hobbies: No Engineer, maintenance) Signed: 01/31/2023 5:24:12 PM By: Deon Pilling RN, BSN Entered By: Deon Pilling on 01/31/2023 10:27:48 -------------------------------------------------------------------------------- Patient/Caregiver Education Details Patient Name: Date of Service: [name], Lucas E. 2/8/2024andnbsp11:00 [place] Record Number: [id number] Patient Account Number: [id number] Date of Birth/Gender: Treating RN: 05/03/1996 (27 y.o. Hessie Diener Primary Care Physician: Mina Marble Other Clinician: Referring Physician: Treating Physician/Extender: Burns [place] in Treatment: 5 Education Assessment Education Provided To: AADHAV, MONTO ([id number])  [id number] Page 7 of 10 Patient Education Topics Provided Wound/Skin Impairment: Handouts: Caring for Your Ulcer Methods: Explain/Verbal Responses: Reinforcements needed Electronic Signature(s) Signed:  01/31/2023 5:24:12 PM By: Deon Pilling RN, BSN Entered By: Deon Pilling on 01/31/2023 10:35:00 -------------------------------------------------------------------------------- Wound Assessment Details Patient Name: Date of Service: [name], [name] 01/31/2023 11:00 A M Medical Record Number: [id number] Patient Account Number: [id number] Date of Birth/Sex: Treating RN: 03-04-1996 (27 y.o. Hessie Diener Primary Care Caspar Favila: Mina Marble Other Clinician: Referring Juanangel Soderholm: Treating Cyanna Neace/Extender: Olivia Mackie Weeks in Treatment: 5 Wound Status Wound Number: 1 Primary Etiology: Diabetic Wound/Ulcer of the Lower Extremity Wound Location: Right Metatarsal head fifth Wound Status: Open Wounding Event: Gradually Appeared Comorbid History: Hypertension, Type I Diabetes, Neuropathy Date Acquired: 12/25/2019 Weeks Of Treatment: 5 Clustered Wound: No Pending Amputation On Presentation Photos Wound Measurements Length: (cm) 0.3 Width: (cm) 0.3 Depth: (cm) 0.5 Area: (cm) 0.071 Volume: (cm) 0.035 % Reduction in Area: 0% % Reduction in Volume: -150% Epithelialization: Small (1-33%) Tunneling: No Undermining: Yes Starting Position (o'clock): 12 Ending Position (o'clock): 12 Maximum Distance: (cm) 0.5 Wound Description Classification: Grade 2 Wound Margin: Distinct, outline attached Exudate Amount: Medium Exudate Type: Serosanguineous Exudate Color: red, brown Foul Odor After Cleansing: No Slough/Fibrino No Wound Bed Granulation Amount: Large (67-100%) Exposed MARKECE, PIZZIMENTI ([id number]) [id number] Page 8 of 10 Granulation Quality: Red Fascia Exposed: No Necrotic Amount: None  Present (0%) Fat Layer (Subcutaneous Tissue) Exposed: Yes Tendon Exposed: No Muscle Exposed: No Joint Exposed: No Bone Exposed: No Periwound Skin Texture Texture Color No Abnormalities Noted: No No Abnormalities Noted: No Callus: Yes Atrophie Blanche: No Crepitus: No Cyanosis: No Excoriation: No Ecchymosis: No Induration: No Erythema: No Rash: No Hemosiderin Staining: No Scarring: No Mottled: No Pallor: No Moisture Rubor: No No Abnormalities Noted: No Dry / Scaly: No Maceration: No Treatment Notes Wound #1 (Metatarsal head fifth) Wound Laterality: Right Cleanser Wound Cleanser Discharge Instruction: Cleanse the wound with wound cleanser prior to applying a clean dressing using gauze sponges, not tissue or cotton balls. Peri-Wound Care Skin Prep Discharge Instruction: Use skin prep as directed Topical Primary Dressing MediHoney Gel, tube 1.5 (oz) Discharge Instruction: Apply to wound bed as instructed [place] 2x2 (in/in) Discharge Instruction: lightly pack into wound bed. Secondary Dressing Optifoam Non-Adhesive Dressing, 4x4 in Discharge Instruction: Apply ONE LAYER OF FOAM D Woven Gauze Sponges 2x2 in Discharge Instruction: Apply over primary dressing as directed. Secured With Conforming Stretch Gauze Bandage, Sterile 2x75 (in/in) Discharge Instruction: Secure with stretch gauze as directed. 25M Medipore H Soft Cloth Surgical T ape, 4 x 10 (in/yd) Discharge Instruction: Secure with tape as directed. Compression Wrap Compression Stockings Add-Ons Electronic Signature(s) Signed: 01/31/2023 5:24:12 PM By: Deon Pilling RN, BSN Entered By: Deon Pilling on 01/31/2023 10:35:13 -------------------------------------------------------------------------------- Wound Assessment Details Patient Name: Date of Service: [name], [name] 01/31/2023 11:00 [place] ([id number].pdf Page 9 of 10 Medical Record  Number: [id number] Patient Account Number: [id number] Date of Birth/Sex: Treating RN: 10-13-1996 (27 y.o. Hessie Diener Primary Care Shyvonne Chastang: Mina Marble Other Clinician: Referring Danely Bayliss: Treating Lavida Patch/Extender: Olivia Mackie Weeks in Treatment: 5 Wound Status Wound Number: 2 Primary Etiology: Diabetic Wound/Ulcer of the Lower Extremity Wound Location: Left Metatarsal head fifth Wound Status: Open Wounding Event: Gradually Appeared Comorbid History: Hypertension, Type I Diabetes, Neuropathy Date Acquired: 09/24/2022 Weeks Of Treatment: 5 Clustered Wound: No Pending Amputation On Presentation Photos Wound Measurements Length: (cm) 0.3 Width: (cm) 0.8 Depth: (cm) 0.2 Area: (cm) 0.188 Volume: (cm) 0.038 % Reduction in Area: -300% % Reduction in Volume: -171.4% Epithelialization: Small (1-33%) Tunneling: No Undermining: No Wound Description  Classification: Grade 2 Wound Margin: Distinct, outline attached Exudate Amount: Medium Exudate Type: Serosanguineous Exudate Color: red, brown Foul Odor After Cleansing: No Slough/Fibrino No Wound Bed Granulation Amount: Large (67-100%) Exposed Structure Granulation Quality: Red Fascia Exposed: No Necrotic Amount: None Present (0%) Fat Layer (Subcutaneous Tissue) Exposed: Yes Tendon Exposed: No Muscle Exposed: No Joint Exposed: No Bone Exposed: No Periwound Skin Texture Texture Color No Abnormalities Noted: No No Abnormalities Noted: No Callus: Yes Atrophie Blanche: No Crepitus: No Cyanosis: No Excoriation: No Ecchymosis: No Induration: No Erythema: No Rash: No Hemosiderin Staining: No Scarring: No Mottled: No Pallor: No Moisture Rubor: No No Abnormalities Noted: No Dry / Scaly: Yes Maceration: No Treatment Notes Wound #2 (Metatarsal head fifth) Wound Laterality: Left Cleanser Wound Cleanser Discharge Instruction: Cleanse the wound with wound cleanser prior to applying a  clean dressing using gauze sponges, not tissue or cotton balls. [name], [name] ([id number]) [phone].pdf Page 10 of 10 Peri-Wound Care Skin Prep Discharge Instruction: Use skin prep as directed Topical Primary Dressing MediHoney Gel, tube 1.5 (oz) Discharge Instruction: Apply to wound bed as instructed Sorbalgon AG Dressing 2x2 (in/in) Discharge Instruction: apply to wound bed. Secondary Dressing Optifoam Non-Adhesive Dressing, 4x4 in Discharge Instruction: Apply ONE LAYER OF FOAM D Woven Gauze Sponges 2x2 in Discharge Instruction: Apply over primary dressing as directed. Secured With Conforming Stretch Gauze Bandage, Sterile 2x75 (in/in) Discharge Instruction: Secure with stretch gauze as directed. 107M Medipore H Soft Cloth Surgical T ape, 4 x 10 (in/yd) Discharge Instruction: Secure with tape as directed. Compression Wrap Compression Stockings Add-Ons Electronic Signature(s) Signed: 01/31/2023 5:24:12 PM By: Deon Pilling RN, BSN Entered By: Deon Pilling on 01/31/2023 10:35:59 -------------------------------------------------------------------------------- Vitals Details Patient Name: Date of Service: [name] 01/31/2023 11:00 A M Medical Record Number: [id number] Patient Account Number: [id number] Date of Birth/Sex: Treating RN: May 23, 1996 (27 y.o. Hessie Diener Primary Care Tytus Strahle: Mina Marble Other Clinician: Referring Magalie Almon: Treating Hiroshi Krummel/Extender: Olivia Mackie Weeks in Treatment: 5 Vital Signs Time Taken: 10:27 Temperature (F): 98 Pulse (bpm): 91 Respiratory Rate (breaths/min): 20 Blood Pressure (mmHg): 130/82 Capillary Blood Glucose (mg/dl): 128 Reference Range: 80 - 120 mg / dl Electronic Signature(s) Signed: 01/31/2023 5:24:12 PM By: Deon Pilling RN, BSN Entered By: Deon Pilling on 01/31/2023 10:27:37

## 2023-02-01 NOTE — Progress Notes (Signed)
TITUS, RIPPEL ([id number]) [phone].pdf Page 1 of 10 Visit Report for 01/31/2023 Chief Complaint Document Details Patient Name: Date of Service: [name], [name] 01/31/2023 11:00 A M Medical Record Number: [id number] Patient Account Number: [id number] Date of Birth/Sex: Treating RN: 05/09/1996 (27 y.o. M) Primary Care Provider: Mina Marble Other Clinician: Referring Provider: Treating Provider/Extender: Olivia Mackie Weeks in Treatment: 5 Information Obtained from: Patient Chief Complaint 12/27/2022; bilateral diabetic foot wounds Electronic Signature(s) Signed: 01/31/2023 3:04:17 PM By: Kalman Shan DO Entered By: Kalman Shan on 01/31/2023 12:43:45 -------------------------------------------------------------------------------- Debridement Details Patient Name: Date of Service: [name] 01/31/2023 11:00 A M Medical Record Number: [id number] Patient Account Number: [id number] Date of Birth/Sex: Treating RN: 1996-04-02 (27 y.o. Hessie Diener Primary Care Provider: Mina Marble Other Clinician: Referring Provider: Treating Provider/Extender: Olivia Mackie Weeks in Treatment: 5 Debridement Performed for Assessment: Wound #1 Right Metatarsal head fifth Performed By: Physician Kalman Shan, DO Debridement Type: Debridement Severity of Tissue Pre Debridement: Fat layer exposed Level of Consciousness (Pre-procedure): Awake and Alert Pre-procedure Verification/Time Out Yes - 10:48 Taken: Start Time: 10:49 Pain Control: Lidocaine 4% T opical Solution T Area Debrided (L x W): otal 2 (cm) x 2 (cm) = 4 (cm) Tissue and other material debrided: Viable, Non-Viable, Callus, Slough, Subcutaneous, Skin: Dermis , Skin: Epidermis, Slough Level: Skin/Subcutaneous Tissue Debridement Description: Excisional Instrument: Curette Bleeding: Minimum Hemostasis Achieved: Pressure End Time:  10:53 Procedural Pain: 0 Post Procedural Pain: 0 Response to Treatment: Procedure was tolerated well Level of Consciousness (Post- Awake and Alert procedure): Post Debridement Measurements of Total Wound Length: (cm) 0.3 Width: (cm) 0.3 Depth: (cm) 0.4 Volume: (cm) 0.028 Character of Wound/Ulcer Post Debridement: Requires Further Debridement Severity of Tissue Post Debridement: Fat layer exposed [name], [name] ([id number].pdf Page 2 of 10 Post Procedure Diagnosis Same as Pre-procedure Electronic Signature(s) Signed: 01/31/2023 3:04:17 PM By: Kalman Shan DO Signed: 01/31/2023 5:24:12 PM By: Deon Pilling RN, BSN Entered By: Deon Pilling on 01/31/2023 10:54:09 -------------------------------------------------------------------------------- Debridement Details Patient Name: Date of Service: [name], [name] 01/31/2023 11:00 A M Medical Record Number: [id number] Patient Account Number: [id number] Date of Birth/Sex: Treating RN: 05-21-1996 (27 y.o. Hessie Diener Primary Care Provider: Mina Marble Other Clinician: Referring Provider: Treating Provider/Extender: Olivia Mackie Weeks in Treatment: 5 Debridement Performed for Assessment: Wound #2 Left Metatarsal head fifth Performed By: Physician Kalman Shan, DO Debridement Type: Debridement Severity of Tissue Pre Debridement: Fat layer exposed Level of Consciousness (Pre-procedure): Awake and Alert Pre-procedure Verification/Time Out Yes - 10:48 Taken: Start Time: 10:49 Pain Control: Lidocaine 4% T opical Solution T Area Debrided (L x W): otal 2 (cm) x 2 (cm) = 4 (cm) Tissue and other material debrided: Viable, Non-Viable, Callus, Slough, Subcutaneous, Skin: Dermis , Skin: Epidermis, Slough Level: Skin/Subcutaneous Tissue Debridement Description: Excisional Instrument: Curette Bleeding: Minimum Hemostasis Achieved: Pressure End Time: 10:53 Procedural  Pain: 0 Post Procedural Pain: 0 Response to Treatment: Procedure was tolerated well Level of Consciousness (Post- Awake and Alert procedure): Post Debridement Measurements of Total Wound Length: (cm) 0.4 Width: (cm) 0.8 Depth: (cm) 0.2 Volume: (cm) 0.05 Character of Wound/Ulcer Post Debridement: Improved Severity of Tissue Post Debridement: Fat layer exposed Post Procedure Diagnosis Same as Pre-procedure Electronic Signature(s) Signed: 01/31/2023 3:04:17 PM By: Kalman Shan DO Signed: 01/31/2023 5:24:12 PM By: Deon Pilling RN, BSN Entered By: Deon Pilling on 01/31/2023 10:59:36 HPI Details -------------------------------------------------------------------------------- [name] ([id number]) [id number] Page 3 of 10 Patient Name: Date of Service: [name], [name].  01/31/2023 11:00 A M Medical Record Number: [id number] Patient Account Number: [id number] Date of Birth/Sex: Treating RN: [birth year] (27 y.o. 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 He has been following with podiatry for this issue. He reports using Silvadene to the areas. Currently not using anything for offloading. He states that the right foot wound has been present for the past 1-1/2 years. He states that the left foot wound appeared about 3 months ago. He has not had recent imaging of the areas. Currently denies systemic signs of infection. 01/03/2023; patient presents for follow-up. He has been using Medihoney and silver alginate to the wound beds. He has been  using his surgical shoes with peg assist. He has not been scheduled for an MRI yet. There is been improvement in wound healing. He currently denies signs of infection. 1/18; patient presents for follow-up. He is scheduled to have his MRI on 1/29. He has been using silver alginate with Medihoney to the wound beds. He has been using his surgical shoes with peg assist. He has no issues or complaints today. He denies signs of infection. 1/25; patient presents for follow-up. He has been using silver alginate with Medihoney to the wound beds. He is starting to develop a new wound to the right lateral foot adjacent to the original right foot wound. He has been using his surgical shoes with peg assist. He currently denies signs of infection. Scheduled for MRI in 4 days. 2/1; patient presents for follow-up. Has been using silver alginate with Medihoney to the wound beds. Unfortunately his insurance denied his MRI a day prior to when it was scheduled. This was reapproved but only to the right foot with a prior Auth. He currently denies signs of infection. 2/8; patient presents for follow-up. He has been using silver alginate and Medihoney to the wound beds. He has no issues or complaints today. He obtained his MRI of the right foot and x-ray of the right foot yesterday. Read at time patient was seen had not resulted. Electronic Signature(s) Signed: 01/31/2023 3:04:17 PM By: Kalman Shan DO Entered By: Kalman Shan on 01/31/2023 12:44:09 -------------------------------------------------------------------------------- Physical Exam Details Patient Name: Date of Service: [name], [name] 01/31/2023 11:00 A M Medical Record Number: [id number] Patient Account Number: [id number] Date of Birth/Sex: Treating RN: 05/28/1996 (27 y.o. M) Primary Care Provider: Mina Marble Other Clinician: Referring Provider: Treating Provider/Extender: Olivia Mackie Weeks in Treatment:  5 Constitutional respirations regular, non-labored and within target range for patient.. Cardiovascular 2+ dorsalis pedis/posterior tibialis pulses. Psychiatric pleasant and cooperative. Notes Bilateral fifth met head wounds to the feet. Granulation tissue With nonviable tissue and callus. No signs of infection including increased warmth, erythema or purulent drainage. Electronic Signature(s) Signed: 01/31/2023 3:04:17 PM By: Kalman Shan DO Entered By: Kalman Shan on 01/31/2023 12:44:47 [name] ([id number] Page 4 of 10 -------------------------------------------------------------------------------- Physician Orders Details Patient Name: Date of Service: XADRIAN, MALOOF 01/31/2023 11:00 A M Medical Record Number: [id number] Patient Account Number: [id number] Date of Birth/Sex: Treating RN: 06-04-1996 (27 y.o. Hessie Diener Primary Care Provider: Mina Marble Other Clinician: Referring Provider: Treating Provider/Extender: Olivia Mackie Weeks in Treatment:  5 Verbal / Phone Orders: No Diagnosis Coding ICD-10 Coding Code Description L97.518 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ity L97.528 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ty E10.40 Type 1 diabetes mellitus with diabetic neuropathy, unspecified E10.621 Type 1 diabetes mellitus with foot ulcer Follow-up Appointments ppointment in 1 week. - Tuesday 0900 02/05/2023 overflow Dr. Heber [place] initial cast. Return A (already scheduled and keep) Thursday with Dr. Heber [place] on 02/07/23 @ 10:15 w/ Allayne Butcher Rm # 9 CAST CHANGE ONLY ppointment in 2 weeks. - Thursday with Dr. Heber [place] 1100 02/14/2023 Room 8 Return A Other: - Will call you with MRI and x-ray results once they return. Anesthetic (In clinic) Topical Lidocaine 4% applied to wound bed Bathing/ Shower/ Hygiene May  shower with protection but do not get wound dressing(s) wet. Protect dressing(s) with water repellant cover (for example, large plastic bag) or a cast cover and may then take shower. Off-Loading Open toe surgical shoe to: - both surgical shoes with PEG ASSIST to both shoes. Wear while walking and standing. DO not walk barefoot or just in socks. Additional Orders / Instructions Follow Nutritious Diet - CLOSELY MONITOR BLOOD SUGAR. Wound Treatment Wound #1 - Metatarsal head fifth Wound Laterality: Right Cleanser: Wound Cleanser (Generic) 1 x Per Day/30 Days Discharge Instructions: Cleanse the wound with wound cleanser prior to applying a clean dressing using gauze sponges, not tissue or cotton balls. Peri-Wound Care: Skin Prep (Generic) 1 x Per Day/30 Days Discharge Instructions: Use skin prep as directed Prim Dressing: MediHoney Gel, tube 1.5 (oz) 1 x Per Day/30 Days ary Discharge Instructions: Apply to wound bed as instructed Prim Dressing: Sorbalgon AG Dressing 2x2 (in/in) (Generic) 1 x Per Day/30 Days ary Discharge Instructions: lightly pack into wound bed. Secondary Dressing: Optifoam Non-Adhesive Dressing, 4x4 in (Generic) 1 x Per Day/30 Days Discharge Instructions: Apply ONE LAYER OF FOAM D Secondary Dressing: Woven Gauze Sponges 2x2 in (Generic) 1 x Per Day/30 Days Discharge Instructions: Apply over primary dressing as directed. Secured With: Child psychotherapist, Sterile 2x75 (in/in) (Generic) 1 x Per Day/30 Days Discharge Instructions: Secure with stretch gauze as directed. Secured With: 45M Medipore H Soft Cloth Surgical T ape, 4 x 10 (in/yd) (Generic) 1 x Per Day/30 Days Discharge Instructions: Secure with tape as directed. Wound #2 - Metatarsal head fifth Wound Laterality: Left Cleanser: Wound Cleanser (Generic) 1 x Per Day/30 Days Discharge Instructions: Cleanse the wound with wound cleanser prior to applying a clean dressing using gauze sponges, not tissue or  cotton balls. Peri-Wound Care: Skin Prep (Generic) 1 x Per Day/30 Days [name], [name] ([id number]) [phone].pdf Page 5 of 10 Discharge Instructions: Use skin prep as directed Prim Dressing: MediHoney Gel, tube 1.5 (oz) 1 x Per Day/30 Days ary Discharge Instructions: Apply to wound bed as instructed Prim Dressing: Sorbalgon AG Dressing 2x2 (in/in) (Generic) 1 x Per Day/30 Days ary Discharge Instructions: apply to wound bed. Secondary Dressing: Optifoam Non-Adhesive Dressing, 4x4 in (Generic) 1 x Per Day/30 Days Discharge Instructions: Apply ONE LAYER OF FOAM D Secondary Dressing: Woven Gauze Sponges 2x2 in (Generic) 1 x Per Day/30 Days Discharge Instructions: Apply over primary dressing as directed. Secured With: Child psychotherapist, Sterile 2x75 (in/in) (Generic) 1 x Per Day/30 Days Discharge Instructions: Secure with stretch gauze as directed. Secured With: 45M Medipore H Soft Cloth Surgical T ape, 4 x 10 (in/yd) (Generic) 1 x Per Day/30 Days Discharge Instructions: Secure with tape as  directed. Electronic Signature(s) Signed: 01/31/2023 3:04:17 PM By: Kalman Shan DO Entered By: Kalman Shan on 01/31/2023 12:45:00 -------------------------------------------------------------------------------- Problem List Details Patient Name: Date of Service: [name], [name] 01/31/2023 11:00 A M Medical Record Number: [id number] Patient Account Number: [id number] Date of Birth/Sex: Treating RN: 03/08/1996 (27 y.o. Hessie Diener Primary Care Provider: Mina Marble Other Clinician: Referring Provider: Treating Provider/Extender: Olivia Mackie Weeks in Treatment: 5 Active Problems ICD-10 Encounter Code Description Active Date MDM Diagnosis L97.518 Non-pressure chronic ulcer of other part of right foot with other specified 12/27/2022 No Yes severity L97.528 Non-pressure chronic ulcer of other part of left foot with other  specified 12/27/2022 No Yes severity E10.40 Type 1 diabetes mellitus with diabetic neuropathy, unspecified 12/27/2022 No Yes E10.621 Type 1 diabetes mellitus with foot ulcer 12/27/2022 No Yes Inactive Problems Resolved Problems Electronic Signature(s) Signed: 01/31/2023 3:04:17 PM By: Kalman Shan DO Entered By: Kalman Shan on 01/31/2023 12:00:51 [name] ([id number] Page 6 of 10 -------------------------------------------------------------------------------- Progress Note Details Patient Name: Date of Service: [name], [name] 01/31/2023 11:00 A M Medical Record Number: [id number] Patient Account Number: [id number] Date of Birth/Sex: Treating RN: Feb 03, 1996 (27 y.o. M) Primary Care Provider: Mina Marble Other Clinician: Referring Provider: Treating Provider/Extender: Olivia Mackie Weeks in Treatment: 5 Subjective Chief Complaint Information obtained from Patient 12/27/2022; bilateral diabetic foot wounds History of Present Illness (HPI) 12/27/2022 Mr. 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 He has been following with podiatry for this issue. He reports using Silvadene to the areas. Currently not using anything for offloading. He states that the right foot wound has been present for the past 1-1/2 years. He states that the left foot wound appeared about 3 months ago. He has not had recent imaging of the areas. Currently denies systemic signs of infection. 01/03/2023; patient presents for follow-up. He has been using Medihoney and silver alginate to the wound beds. He has been using his surgical shoes with peg assist. He has not been scheduled for an MRI yet. There is been improvement in wound healing. He currently  denies signs of infection. 1/18; patient presents for follow-up. He is scheduled to have his MRI on 1/29. He has been using silver alginate with Medihoney to the wound beds. He has been using his surgical shoes with peg assist. He has no issues or complaints today. He denies signs of infection. 1/25; patient presents for follow-up. He has been using silver alginate with Medihoney to the wound beds. He is starting to develop a new wound to the right lateral foot adjacent to the original right foot wound. He has been using his surgical shoes with peg assist. He currently denies signs of infection. Scheduled for MRI in 4 days. 2/1; patient presents for follow-up. Has been using silver alginate with Medihoney to the wound beds. Unfortunately his insurance denied his MRI a day prior to when it was scheduled. This was reapproved but only to the right foot with a prior Auth. He currently denies signs of infection. 2/8; patient presents for follow-up. He has been using silver alginate and Medihoney to the wound beds. He has no issues or complaints today. He obtained his MRI of the right foot and x-ray of the right foot yesterday. Read at time patient was seen had not resulted. Patient History Information  obtained from Patient, Chart. Family History Unknown History. Social History Never smoker, Marital Status - Single, Alcohol Use - Never, Drug Use - No History, Caffeine Use - Rarely. Medical History Cardiovascular Patient has history of Hypertension Endocrine Patient has history of Type I Diabetes Denies history of Type II Diabetes Neurologic Patient has history of Neuropathy Hospitalization/Surgery History - R foot wound debridement and closure. 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 Medical A Surgical History Notes nd Cardiovascular heart murmur Objective Constitutional respirations regular, non-labored and within target range for patient.MARKAVION, [name] ([id number])  [id number] Page 7 of 10 Vitals Time Taken: 10:27 AM, Temperature: 98 F, Pulse: 91 bpm, Respiratory Rate: 20 breaths/min, Blood Pressure: 130/82 mmHg, Capillary Blood Glucose: 128 mg/dl. Cardiovascular 2+ dorsalis pedis/posterior tibialis pulses. Psychiatric pleasant and cooperative. General Notes: Bilateral fifth met head wounds to the feet. Granulation tissue With nonviable tissue and callus. No signs of infection including increased warmth, erythema or purulent drainage. Integumentary (Hair, Skin) Wound #1 status is Open. Original cause of wound was Gradually Appeared. The date acquired was: 12/25/2019. The wound has been in treatment 5 weeks. The wound is located on the Right Metatarsal head fifth. The wound measures 0.3cm length x 0.3cm width x 0.5cm depth; 0.071cm^2 area and 0.035cm^3 volume. There is Fat Layer (Subcutaneous Tissue) exposed. There is no tunneling noted, however, there is undermining starting at 12:00 and ending at 12:00 with a maximum distance of 0.5cm. There is a medium amount of serosanguineous drainage noted. The wound margin is distinct with the outline attached to the wound base. There is large (67-100%) red granulation within the wound bed. There is no necrotic tissue within the wound bed. The periwound skin appearance exhibited: Callus. The periwound skin appearance did not exhibit: Crepitus, Excoriation, Induration, Rash, Scarring, Dry/Scaly, Maceration, Atrophie Blanche, Cyanosis, Ecchymosis, Hemosiderin Staining, Mottled, Pallor, Rubor, Erythema. Wound #2 status is Open. Original cause of wound was Gradually Appeared. The date acquired was: 09/24/2022. The wound has been in treatment 5 weeks. The wound is located on the Left Metatarsal head fifth. The wound measures 0.3cm length x 0.8cm width x 0.2cm depth; 0.188cm^2 area and 0.038cm^3 volume. There is Fat Layer (Subcutaneous Tissue) exposed. There is no tunneling or undermining noted.  There is a medium amount of serosanguineous drainage noted. The wound margin is distinct with the outline attached to the wound base. There is large (67-100%) red granulation within the wound bed. There is no necrotic tissue within the wound bed. The periwound skin appearance exhibited: Callus, Dry/Scaly. The periwound skin appearance did not exhibit: Crepitus, Excoriation, Induration, Rash, Scarring, Maceration, Atrophie Blanche, Cyanosis, Ecchymosis, Hemosiderin Staining, Mottled, Pallor, Rubor, Erythema. 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 I debrided nonviable tissue. I recommended continuing the course with Medihoney and silver alginate. The left foot wound has improved in size) since last clinic visit. I debrided nonviable tissue here and also recommended continuing with silver alginate and Medihoney. Patient obtained pain in his right foot MRI and left foot x-ray yesterday and results were not available during the encounter. Results did come in later and showed no evidence of osteomyelitis to the right foot. The left foot x-ray showed no radiographic evidence of osteomyelitis. Plan is to start the cast to the right  foot next clinic visit. Patient is agreeable with this. Follow-up in 1 week Procedures Wound #1 Pre-procedure diagnosis of Wound #1 is a Diabetic Wound/Ulcer of the Lower Extremity located on the Right Metatarsal head fifth .Severity of Tissue Pre Debridement is: Fat layer exposed. There was a Excisional Skin/Subcutaneous Tissue Debridement with a total area of 4 sq cm performed by Kalman Shan, DO. With the following instrument(s): Curette to remove Viable and Non-Viable  tissue/material. Material removed includes Callus, Subcutaneous Tissue, Slough, Skin: Dermis, and Skin: Epidermis after achieving pain control using Lidocaine 4% Topical Solution. A time out was conducted at 10:48, prior to the start of the procedure. A Minimum amount of bleeding was controlled with Pressure. The procedure was tolerated well with a pain level of 0 throughout and a pain level of 0 following the procedure. Post Debridement Measurements: 0.3cm length x 0.3cm width x 0.4cm depth; 0.028cm^3 volume. Character of Wound/Ulcer Post Debridement requires further debridement. Severity of Tissue Post Debridement is: Fat layer exposed. Post procedure Diagnosis Wound #1: Same as Pre-Procedure Wound #2 Pre-procedure diagnosis of Wound #2 is a Diabetic Wound/Ulcer of the Lower Extremity located on the Left Metatarsal head fifth .Severity of Tissue Pre Debridement is: Fat layer exposed. There was a Excisional Skin/Subcutaneous Tissue Debridement with a total area of 4 sq cm performed by Kalman Shan, DO. With the following instrument(s): Curette to remove Viable and Non-Viable tissue/material. Material removed includes Callus, Subcutaneous Tissue, Slough, Skin: Dermis, and Skin: Epidermis after achieving pain control using Lidocaine 4% Topical Solution. A time out was conducted at 10:48, prior to the start of the procedure. A Minimum amount of bleeding was controlled with Pressure. The procedure was tolerated well with a pain level of 0 throughout and a pain level of 0 following the procedure. Post Debridement Measurements: 0.4cm length x 0.8cm width x 0.2cm depth; 0.05cm^3 volume. Character of Wound/Ulcer Post Debridement is improved. Severity of Tissue Post Debridement is: Fat layer exposed. Post procedure Diagnosis Wound #2: Same as Pre-Procedure Plan Follow-up Appointments: Return Appointment in 1 week. - Tuesday 0900 02/05/2023 overflow Dr. Heber [place] initial cast. (already scheduled and keep)  Thursday with Dr. Heber [place] on [name], [name] ([id number]) [id number] Page 8 of 10 02/07/23 @ 10:15 w/ Allayne Butcher Rm # 9 CAST CHANGE ONLY Return Appointment in 2 weeks. - Thursday with Dr. Heber [place] 1100 02/14/2023 Room 8 Other: - Will call you with MRI and x-ray results once they return. Anesthetic: (In clinic) Topical Lidocaine 4% applied to wound bed Bathing/ Shower/ Hygiene: May shower with protection but do not get wound dressing(s) wet. Protect dressing(s) with water repellant cover (for example, large plastic bag) or a cast cover and may then take shower. Off-Loading: Open toe surgical shoe to: - both surgical shoes with PEG ASSIST to both shoes. Wear while walking and standing. DO not walk barefoot or just in socks. Additional Orders / Instructions: Follow Nutritious Diet - CLOSELY MONITOR BLOOD SUGAR. WOUND #1: - Metatarsal head fifth Wound Laterality: Right Cleanser: Wound Cleanser (Generic) 1 x Per Day/30 Days Discharge Instructions: Cleanse the wound with wound cleanser prior to applying a clean dressing using gauze sponges, not tissue or cotton balls. Peri-Wound Care: Skin Prep (Generic) 1 x Per Day/30 Days Discharge Instructions: Use skin prep as directed Prim Dressing: MediHoney Gel, tube 1.5 (oz) 1 x Per Day/30 Days ary Discharge Instructions: Apply to wound bed as instructed Prim Dressing: Sorbalgon AG Dressing 2x2 (in/in) (Generic) 1 x Per Day/30 Days ary Discharge Instructions: lightly pack into  wound bed. Secondary Dressing: Optifoam Non-Adhesive Dressing, 4x4 in (Generic) 1 x Per Day/30 Days Discharge Instructions: Apply ONE LAYER OF FOAM D Secondary Dressing: Woven Gauze Sponges 2x2 in (Generic) 1 x Per Day/30 Days Discharge Instructions: Apply over primary dressing as directed. Secured With: Child psychotherapist, Sterile 2x75 (in/in) (Generic) 1 x Per Day/30 Days Discharge Instructions: Secure with stretch gauze as  directed. Secured With: 487M Medipore H Soft Cloth Surgical T ape, 4 x 10 (in/yd) (Generic) 1 x Per Day/30 Days Discharge Instructions: Secure with tape as directed. WOUND #2: - Metatarsal head fifth Wound Laterality: Left Cleanser: Wound Cleanser (Generic) 1 x Per Day/30 Days Discharge Instructions: Cleanse the wound with wound cleanser prior to applying a clean dressing using gauze sponges, not tissue or cotton balls. Peri-Wound Care: Skin Prep (Generic) 1 x Per Day/30 Days Discharge Instructions: Use skin prep as directed Prim Dressing: MediHoney Gel, tube 1.5 (oz) 1 x Per Day/30 Days ary Discharge Instructions: Apply to wound bed as instructed Prim Dressing: Sorbalgon AG Dressing 2x2 (in/in) (Generic) 1 x Per Day/30 Days ary Discharge Instructions: apply to wound bed. Secondary Dressing: Optifoam Non-Adhesive Dressing, 4x4 in (Generic) 1 x Per Day/30 Days Discharge Instructions: Apply ONE LAYER OF FOAM D Secondary Dressing: Woven Gauze Sponges 2x2 in (Generic) 1 x Per Day/30 Days Discharge Instructions: Apply over primary dressing as directed. Secured With: Child psychotherapist, Sterile 2x75 (in/in) (Generic) 1 x Per Day/30 Days Discharge Instructions: Secure with stretch gauze as directed. Secured With: 487M Medipore H Soft Cloth Surgical T ape, 4 x 10 (in/yd) (Generic) 1 x Per Day/30 Days Discharge Instructions: Secure with tape as directed. 1. In office sharp debridement 2. Medihoney and silver alginate 3. Aggressive offloadingoosurgical shoes with peg assist 4. Follow-up in 1 weekooplan for total contact cast Electronic Signature(s) Signed: 01/31/2023 3:04:17 PM By: Kalman Shan DO Entered By: Kalman Shan on 01/31/2023 12:47:21 -------------------------------------------------------------------------------- HxROS Details Patient Name: Date of Service: [name]. 01/31/2023 11:00 A M Medical Record Number: [id number] Patient Account Number:  [id number] Date of Birth/Sex: Treating RN: 12-Jan-1996 (27 y.o. M) Primary Care Provider: Mina Marble Other Clinician: Referring Provider: Treating Provider/Extender: Olivia Mackie Weeks in Treatment: 5 Information Obtained From Patient Chart Cardiovascular Medical History: Positive for: Hypertension Past Medical History Notes: heart murmur MOSI, MONTREUIL ([id number]) [phone].pdf Page 9 of 10 Endocrine Medical History: Positive for: Type I Diabetes Negative for: Type II Diabetes Time with diabetes: 1 year ago Treated with: Insulin, Diet Blood sugar tested every day: Yes Tested : TID Neurologic Medical History: Positive for: Neuropathy Immunizations Pneumococcal Vaccine: Received Pneumococcal Vaccination: No Implantable Devices None Hospitalization / Surgery History Type of Hospitalization/Surgery 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 Family and Social History Unknown History: Yes; Never smoker; Marital Status - Single; Alcohol Use: Never; Drug Use: No History; Caffeine Use: Rarely; Financial Concerns: No; Food, Clothing or Shelter Needs: No; Support System Lacking: No; Transportation Concerns: No Electronic Signature(s) Signed: 01/31/2023 3:04:17 PM By: Kalman Shan DO Entered By: Kalman Shan on 01/31/2023 12:44:21 -------------------------------------------------------------------------------- SuperBill Details Patient Name: Date of Service: LENARDO, GUNTRUM 01/31/2023 Medical Record Number: [id number] Patient Account Number: [id number] Date of Birth/Sex: Treating RN: 1996/07/29 (27 y.o. 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 E10.40 Type 1 diabetes mellitus with diabetic neuropathy, unspecified E10.621 Type 1 diabetes mellitus with foot ulcer Facility Procedures Physician Procedures : CPT4 Code Description Modifier [id number] - WC PHYS SUBQ TISS 20 SQ CM ICD-10 Diagnosis Description L97.518 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ity L97.528 Non-pressure chronic ulcer of other part of left  foot with other specified severity E10.621 Type 1 diabetes mellitus with foot ulcer Quantity: 1 Electronic Signature(s) Signed: 01/31/2023 3:04:17 PM By: Kalman Shan DO Entered By: Kalman Shan on 01/31/2023 12:47:42

## 2023-02-05 ENCOUNTER — Encounter (HOSPITAL_BASED_OUTPATIENT_CLINIC_OR_DEPARTMENT_OTHER): Payer: BC Managed Care – PPO | Admitting: Internal Medicine

## 2023-02-05 DIAGNOSIS — L97528 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ty: Secondary | ICD-10-CM | POA: Diagnosis not present

## 2023-02-05 DIAGNOSIS — Z794 Long term (current) use of insulin: Secondary | ICD-10-CM | POA: Diagnosis not present

## 2023-02-05 DIAGNOSIS — L97518 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ity: Secondary | ICD-10-CM

## 2023-02-05 DIAGNOSIS — I10 Essential (primary) hypertension: Secondary | ICD-10-CM | POA: Diagnosis not present

## 2023-02-05 DIAGNOSIS — E1040 Type 1 diabetes mellitus with diabetic neuropathy, unspecified: Secondary | ICD-10-CM | POA: Diagnosis not present

## 2023-02-05 DIAGNOSIS — E10621 Type 1 diabetes mellitus with foot ulcer: Secondary | ICD-10-CM | POA: Diagnosis not present

## 2023-02-07 ENCOUNTER — Encounter (HOSPITAL_BASED_OUTPATIENT_CLINIC_OR_DEPARTMENT_OTHER): Payer: BC Managed Care – PPO | Admitting: Internal Medicine

## 2023-02-07 DIAGNOSIS — L97528 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ty: Secondary | ICD-10-CM | POA: Diagnosis not present

## 2023-02-07 DIAGNOSIS — I10 Essential (primary) hypertension: Secondary | ICD-10-CM | POA: Diagnosis not present

## 2023-02-07 DIAGNOSIS — L97518 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ity: Secondary | ICD-10-CM | POA: Diagnosis not present

## 2023-02-07 DIAGNOSIS — Z794 Long term (current) use of insulin: Secondary | ICD-10-CM | POA: Diagnosis not present

## 2023-02-07 DIAGNOSIS — E10621 Type 1 diabetes mellitus with foot ulcer: Secondary | ICD-10-CM | POA: Diagnosis not present

## 2023-02-07 DIAGNOSIS — E1040 Type 1 diabetes mellitus with diabetic neuropathy, unspecified: Secondary | ICD-10-CM | POA: Diagnosis not present

## 2023-02-07 NOTE — Progress Notes (Signed)
Lucas, Brown ([id number]) [id number] Page 1 of 10 Visit Report for 02/05/2023 Chief Complaint Document Details Patient Name: Date of Service: [name], [name] 02/05/2023 9:00 A M Medical Record Number: [id number] Patient Account Number: [id number] Date of Birth/Sex: Treating RN: [birth year] (27 y.o. M) Primary Care Provider: Mina Marble Other Clinician: Referring Provider: Treating Provider/Extender: Olivia Mackie Weeks in Treatment: 5 Information Obtained from: Patient Chief Complaint 12/27/2022; bilateral diabetic foot wounds Electronic Signature(s) Signed: 02/05/2023 11:58:04 AM By: Kalman Shan DO Entered By: Kalman Shan on 02/05/2023 10:00:52 -------------------------------------------------------------------------------- Debridement Details Patient Name: Date of Service: [name] 02/05/2023 9:00 [place] Record Number: [id number] Patient Account Number: [id number] Date of Birth/Sex: Treating RN: 31-Oct-1996 (27 y.o. [name] Primary Care Provider: Mina Marble Other Clinician: Referring Provider: Treating Provider/Extender: Olivia Mackie Weeks in Treatment: 5 Debridement Performed for Assessment: Wound #1 Right Metatarsal head fifth Performed By: Physician Kalman Shan, DO Debridement Type: Debridement Severity of Tissue Pre Debridement: Fat layer exposed Level of Consciousness (Pre-procedure): Awake and Alert Pre-procedure Verification/Time Out Yes - 09:35 Taken: Start Time: 09:39 Pain Control: Lidocaine 4% T opical Solution T Area Debrided (L x W): otal 0.3 (cm) x 0.5 (cm) = 0.15 (cm) Tissue and other material debrided: Non-Viable, Callus, Slough, Subcutaneous, Slough Level: Skin/Subcutaneous Tissue Debridement Description: Excisional Instrument: Curette Bleeding: Minimum Hemostasis Achieved: Pressure Procedural Pain: 0 Post Procedural Pain:  0 Response to Treatment: Procedure was tolerated well Level of Consciousness (Post- Awake and Alert procedure): Post Debridement Measurements of Total Wound Length: (cm) 0.3 Width: (cm) 0.5 Depth: (cm) 0.2 Volume: (cm) 0.024 Character of Wound/Ulcer Post Debridement: Improved Severity of Tissue Post Debridement: Fat layer exposed RAMELL, FENTERS ([id number]) [id number] Page 2 of 10 Post Procedure Diagnosis Same as Pre-procedure Notes Scribed for Dr Heber [place] by Sharyn Creamer, RN Electronic Signature(s) Signed: 02/05/2023 11:58:04 AM By: Kalman Shan DO Signed: 02/06/2023 3:48:00 PM By: Sharyn Creamer RN, BSN Entered By: Sharyn Creamer on 02/05/2023 09:40:53 -------------------------------------------------------------------------------- Debridement Details Patient Name: Date of Service: [name] 02/05/2023 9:00 A M Medical Record Number: [id number] Patient Account Number: [id number] Date of Birth/Sex: Treating RN: 1996/11/17 (27 y.o. [name] Primary Care Provider: Mina Marble Other Clinician: Referring Provider: Treating Provider/Extender: Olivia Mackie Weeks in Treatment: 5 Debridement Performed for Assessment: Wound #2 Left Metatarsal head fifth Performed By: Physician Kalman Shan, DO Debridement Type: Debridement Severity of Tissue Pre Debridement: Fat layer exposed Level of Consciousness (Pre-procedure): Awake and Alert Pre-procedure Verification/Time Out Yes - 09:35 Taken: Start Time: 09:39 Pain Control: Lidocaine 4% T opical Solution T Area Debrided (L x W): otal 0.3 (cm) x 0.4 (cm) = 0.12 (cm) Tissue and other material debrided: Non-Viable, Callus, Slough, Subcutaneous, Slough Level: Skin/Subcutaneous Tissue Debridement Description: Excisional Instrument: Curette Bleeding: Minimum Hemostasis Achieved: Pressure Procedural Pain: 0 Post Procedural Pain: 0 Response to Treatment:  Procedure was tolerated well Level of Consciousness (Post- Awake and Alert procedure): Post Debridement Measurements of Total Wound Length: (cm) 0.3 Width: (cm) 0.4 Depth: (cm) 0.2 Volume: (cm) 0.019 Character of Wound/Ulcer Post Debridement: Improved Severity of Tissue Post Debridement: Fat layer exposed Post Procedure Diagnosis Same as Pre-procedure Notes Scribed for Dr Heber [place] by Sharyn Creamer, RN Electronic Signature(s) Signed: 02/05/2023 11:58:04 AM By: Kalman Shan DO Signed: 02/06/2023 3:48:00 PM By: Sharyn Creamer RN, BSN Entered By: Sharyn Creamer on 02/05/2023 09:42:54 LAY, GEHRES ([id number]) [id number] Page 3 of 10 -------------------------------------------------------------------------------- HPI Details Patient Name: Date of Service: Lucas, Brown. 02/05/2023 9:00  A M Medical Record Number: [id number] Patient Account Number: [id number] Date of Birth/Sex: Treating RN: 02-01-1996 (27 y.o. M) Primary Care Provider: Mina Marble Other Clinician: Referring Provider: Treating Provider/Extender: Olivia Mackie Weeks in Treatment: 5 History of Present Illness HPI Description: 12/27/2022 Lucas. 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 He has been following with podiatry for this issue. He reports using Silvadene to the areas. Currently not using anything for offloading. He states that the right foot wound has been present for the past 1-1/2 years. He states that the left foot wound appeared about 3 months ago. He has not had recent imaging of the areas. Currently denies systemic signs of infection. 01/03/2023; patient presents for follow-up. He has been using Medihoney and silver alginate to the wound beds. He has  been using his surgical shoes with peg assist. He has not been scheduled for an MRI yet. There is been improvement in wound healing. He currently denies signs of infection. 1/18; patient presents for follow-up. He is scheduled to have his MRI on 1/29. He has been using silver alginate with Medihoney to the wound beds. He has been using his surgical shoes with peg assist. He has no issues or complaints today. He denies signs of infection. 1/25; patient presents for follow-up. He has been using silver alginate with Medihoney to the wound beds. He is starting to develop a new wound to the right lateral foot adjacent to the original right foot wound. He has been using his surgical shoes with peg assist. He currently denies signs of infection. Scheduled for MRI in 4 days. 2/1; patient presents for follow-up. Has been using silver alginate with Medihoney to the wound beds. Unfortunately his insurance denied his MRI a day prior to when it was scheduled. This was reapproved but only to the right foot with a prior Auth. He currently denies signs of infection. 2/8; patient presents for follow-up. He has been using silver alginate and Medihoney to the wound beds. He has no issues or complaints today. He obtained his MRI of the right foot and x-ray of the right foot yesterday. Read at time patient was seen had not resulted. 2/13; patient presents for follow-up. His right foot MRI showed no evidence of osteomyelitis. Plan is for the total contact cast and patient was agreeable to this. He has been using silver alginate and Medihoney to the wound beds. He denies signs of infection. Electronic Signature(s) Signed: 02/05/2023 11:58:04 AM By: Kalman Shan DO Entered By: Kalman Shan on 02/05/2023 10:01:21 -------------------------------------------------------------------------------- Physical Exam Details Patient Name: Date of Service: TAYJON, TRETTEL 02/05/2023 9:00 A M Medical Record Number:  [id number] Patient Account Number: [id number] Date of Birth/Sex: Treating RN: [birth year] (27 y.o. M) Primary Care Provider: Mina Marble Other Clinician: Referring Provider: Treating Provider/Extender: Olivia Mackie Weeks in Treatment: 5 Constitutional respirations regular, non-labored and within target range for patient.. Cardiovascular 2+ dorsalis pedis/posterior tibialis pulses. Psychiatric pleasant and cooperative. Notes Bilateral fifth met head wounds to the feet. Granulation tissue With nonviable tissue and callus. No signs of infection including increased warmth, erythema or purulent drainage. Electronic Signature(s) ALPHONSO, MAYORAL ([id number]) [id number] Page 4 of 10 Signed: 02/05/2023 11:58:04 AM By: Kalman Shan DO Entered By: Kalman Shan on 02/05/2023 10:01:50 -------------------------------------------------------------------------------- Physician Orders Details Patient Name: Date of Service: Alycia Rossetti  E. 02/05/2023 9:00 A M Medical Record Number: [id number] Patient Account Number: [id number] Date of Birth/Sex: Treating RN: November 17, 1996 (27 y.o. [name] Primary Care Provider: Mina Marble Other Clinician: Referring Provider: Treating Provider/Extender: Olivia Mackie Weeks in Treatment: 5 Verbal / Phone Orders: No Diagnosis Coding Follow-up Appointments ppointment in 1 week. - Thursday with Dr. Heber [place] on 02/07/23 @ 10:15 w/ Allayne Butcher Rm # 9 CAST CHANGE ONLY Return A ppointment in 2 weeks. - Thursday with Dr. Heber [place] 1100 02/14/2023 Room 8 Return A Other: - Will call you with MRI and x-ray results once they return. Anesthetic (In clinic) Topical Lidocaine 4% applied to wound bed Bathing/ Shower/ Hygiene May shower with protection but do not get wound dressing(s) wet. Protect dressing(s) with water repellant cover (for example, large plastic bag) or a cast cover and  may then take shower. Off-Loading Total Contact Cast to Right Lower Extremity - wear black boot with cast at all times Open toe surgical shoe to: - surgical shoes with PEG ASSIST to left shoes. Wear while walking and standing. DO not walk barefoot or just in socks. Additional Orders / Instructions Follow Nutritious Diet - CLOSELY MONITOR BLOOD SUGAR. Wound Treatment Wound #1 - Metatarsal head fifth Wound Laterality: Right Cleanser: Wound Cleanser (Generic) 1 x Per Day/30 Days Discharge Instructions: Cleanse the wound with wound cleanser prior to applying a clean dressing using gauze sponges, not tissue or cotton balls. Peri-Wound Care: Skin Prep (Generic) 1 x Per Day/30 Days Discharge Instructions: Use skin prep as directed Topical: Gentamicin 1 x Per Day/30 Days Discharge Instructions: As directed by physician Topical: Mupirocin Ointment 1 x Per Day/30 Days Discharge Instructions: Apply Mupirocin (Bactroban) as instructed Prim Dressing: Hydrofera Blue Ready Transfer Foam, 2.5x2.5 (in/in) 1 x Per Day/30 Days ary Discharge Instructions: Apply directly to wound bed as directed Secondary Dressing: Optifoam Non-Adhesive Dressing, 4x4 in (Generic) 1 x Per Day/30 Days Discharge Instructions: Apply ONE LAYER OF FOAM D Secondary Dressing: Woven Gauze Sponges 2x2 in (Generic) 1 x Per Day/30 Days Discharge Instructions: Apply over primary dressing as directed. Secured With: Child psychotherapist, Sterile 2x75 (in/in) (Generic) 1 x Per Day/30 Days Discharge Instructions: Secure with stretch gauze as directed. Secured With: 72M Medipore H Soft Cloth Surgical T ape, 4 x 10 (in/yd) (Generic) 1 x Per Day/30 Days Discharge Instructions: Secure with tape as directed. Wound #2 - Metatarsal head fifth Wound Laterality: Left Cleanser: Wound Cleanser (Generic) 1 x Per Day/30 Days Discharge Instructions: Cleanse the wound with wound cleanser prior to applying a clean dressing using gauze sponges,  not tissue or cotton balls. Peri-Wound Care: Skin Prep (Generic) 1 x Per Day/30 Days Discharge Instructions: Use skin prep as directed MEKAI, KORINEK ([id number]) [phone] Page 5 of 10 Prim Dressing: MediHoney Gel, tube 1.5 (oz) 1 x Per Day/30 Days ary Discharge Instructions: Apply to wound bed as instructed Prim Dressing: Sorbalgon AG Dressing 2x2 (in/in) (Generic) 1 x Per Day/30 Days ary Discharge Instructions: apply to wound bed. Secondary Dressing: Optifoam Non-Adhesive Dressing, 4x4 in (Generic) 1 x Per Day/30 Days Discharge Instructions: Apply ONE LAYER OF FOAM D Secondary Dressing: Woven Gauze Sponges 2x2 in (Generic) 1 x Per Day/30 Days Discharge Instructions: Apply over primary dressing as directed. Secured With: Child psychotherapist, Sterile 2x75 (in/in) (Generic) 1 x Per Day/30 Days Discharge Instructions: Secure with stretch gauze as directed. Secured With: 72M Medipore H Soft Cloth Surgical T ape, 4 x 10 (in/yd) (Generic) 1 x Per Day/30 Days  Discharge Instructions: Secure with tape as directed. Patient Medications llergies: amoxicillin, Lexapro A Notifications Medication Indication Start End prior to debridement 02/05/2023 lidocaine DOSE 0 - topical 4 % cream - 0 cream topical once daily Electronic Signature(s) Signed: 02/05/2023 11:58:04 AM By: Kalman Shan DO Entered By: Kalman Shan on 02/05/2023 10:01:58 -------------------------------------------------------------------------------- Problem List Details Patient Name: Date of Service: [name]. 02/05/2023 9:00 A M Medical Record Number: [id number] Patient Account Number: [id number] Date of Birth/Sex: Treating RN: 1996-08-23 (27 y.o. 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 12/27/2022 No Yes severity L97.528 Non-pressure chronic ulcer of other part of left foot with other specified 12/27/2022 No Yes severity E10.40 Type 1 diabetes mellitus with diabetic neuropathy, unspecified 12/27/2022 No Yes E10.621 Type 1 diabetes mellitus with foot ulcer 12/27/2022 No Yes Inactive Problems KEDARIUS, VERBA ([id number]) [id number] Page 6 of 10 Resolved Problems Electronic Signature(s) Signed: 02/05/2023 11:58:04 AM By: Kalman Shan DO Entered By: Kalman Shan on 02/05/2023 10:00:35 -------------------------------------------------------------------------------- Progress Note Details Patient Name: Date of Service: [name] 02/05/2023 9:00 A M Medical Record Number: [id number] Patient Account Number: [id number] Date of Birth/Sex: Treating RN: 1996/06/07 (27 y.o. 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 He has been following with podiatry for this issue. He reports using Silvadene to the areas. Currently not using anything for offloading. He states that the right foot wound has been present for the past 1-1/2 years. He states that the left foot wound appeared about 3 months ago. He has not had recent imaging of the areas. Currently denies systemic signs of infection. 01/03/2023; patient presents for follow-up. He has been  using Medihoney and silver alginate to the wound beds. He has been using his surgical shoes with peg assist. He has not been scheduled for an MRI yet. There is been improvement in wound healing. He currently denies signs of infection. 1/18; patient presents for follow-up. He is scheduled to have his MRI on 1/29. He has been using silver alginate with Medihoney to the wound beds. He has been using his surgical shoes with peg assist. He has no issues or complaints today. He denies signs of infection. 1/25; patient presents for follow-up. He has been using silver alginate with Medihoney to the wound beds. He is starting to develop a new wound to the right lateral foot adjacent to the original right foot wound. He has been using his surgical shoes with peg assist. He currently denies signs of infection. Scheduled for MRI in 4 days. 2/1; patient presents for follow-up. Has been using silver alginate with Medihoney to the wound beds. Unfortunately his insurance denied his MRI a day prior to when it was scheduled. This was reapproved but only to the right foot with a prior Auth. He currently denies signs of infection. 2/8; patient presents for follow-up. He has been using silver alginate and Medihoney to the wound beds. He  has no issues or complaints today. He obtained his MRI of the right foot and x-ray of the right foot yesterday. Read at time patient was seen had not resulted. 2/13; patient presents for follow-up. His right foot MRI showed no evidence of osteomyelitis. Plan is for the total contact cast and patient was agreeable to this. He has been using silver alginate and Medihoney to the wound beds. He denies signs of infection. Patient History Information obtained from Patient, Chart. Family History Unknown History. Social History Never smoker, Marital Status - Single, Alcohol Use - Never, Drug Use - No History, Caffeine Use - Rarely. Medical History Cardiovascular Patient has history of  Hypertension Endocrine Patient has history of Type I Diabetes Denies history of Type II Diabetes Neurologic Patient has history of Neuropathy Hospitalization/Surgery History - R foot wound debridement and closure. 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 Medical A Surgical History Notes nd Cardiovascular heart murmur SHAYDON, ROESCH ([id number]) [id number] Page 7 of 10 Objective Constitutional respirations regular, non-labored and within target range for patient.. Vitals Time Taken: 9:18 AM, Temperature: 99.2 F, Pulse: 91 bpm, Respiratory Rate: 18 breaths/min, Blood Pressure: 121/81 mmHg. Cardiovascular 2+ dorsalis pedis/posterior tibialis pulses. Psychiatric pleasant and cooperative. General Notes: Bilateral fifth met head wounds to the feet. Granulation tissue With nonviable tissue and callus. No signs of infection including increased warmth, erythema or purulent drainage. Integumentary (Hair, Skin) Wound #1 status is Open. Original cause of wound was Gradually Appeared. The date acquired was: 12/25/2019. The wound has been in treatment 5 weeks. The wound is located on the Right Metatarsal head fifth. The wound measures 0.3cm length x 0.5cm width x 0.2cm depth; 0.118cm^2 area and 0.024cm^3 volume. There is Fat Layer (Subcutaneous Tissue) exposed. There is no tunneling or undermining noted. There is a medium amount of serosanguineous drainage noted. The wound margin is distinct with the outline attached to the wound base. There is large (67-100%) red granulation within the wound bed. There is a small (1-33%) amount of necrotic tissue within the wound bed including Adherent Slough. The periwound skin appearance exhibited: Callus. The periwound skin appearance did not exhibit: Crepitus, Excoriation, Induration, Rash, Scarring, Dry/Scaly, Maceration, Atrophie Blanche, Cyanosis, Ecchymosis, Hemosiderin Staining, Mottled, Pallor, Rubor, Erythema. Wound #2 status is Open.  Original cause of wound was Gradually Appeared. The date acquired was: 09/24/2022. The wound has been in treatment 5 weeks. The wound is located on the Left Metatarsal head fifth. The wound measures 0.3cm length x 0.4cm width x 0.2cm depth; 0.094cm^2 area and 0.019cm^3 volume. There is Fat Layer (Subcutaneous Tissue) exposed. There is no tunneling or undermining noted. There is a medium amount of serosanguineous drainage noted. The wound margin is distinct with the outline attached to the wound base. There is large (67-100%) red granulation within the wound bed. There is a small (1-33%) amount of necrotic tissue within the wound bed including Adherent Slough. The periwound skin appearance exhibited: Callus, Dry/Scaly. The periwound skin appearance did not exhibit: Crepitus, Excoriation, Induration, Rash, Scarring, Maceration, Atrophie Blanche, Cyanosis, Ecchymosis, Hemosiderin Staining, Mottled, Pallor, Rubor, Erythema. 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 At this time I recommended the total contact cast. I debrided nonviable tissue and recommended Hydrofera Blue and antibiotic  ointment under the cast. T the left foot wound I debrided nonviable tissue recommended continue Medihoney and o silver alginate here. Continue aggressive offloading with surgical shoe and peg assist. Follow-up in 2 days for obligatory cast change Procedures Wound #1 Pre-procedure diagnosis of Wound #1 is a Diabetic Wound/Ulcer of the Lower Extremity located on the Right Metatarsal head fifth .Severity of Tissue Pre Debridement is: Fat layer exposed. There was a Excisional Skin/Subcutaneous  Tissue Debridement with a total area of 0.15 sq cm performed by Kalman Shan, DO. With the following instrument(s): Curette to remove Non-Viable tissue/material. Material removed includes Callus, Subcutaneous Tissue, and Slough after achieving pain control using Lidocaine 4% Topical Solution. No specimens were taken. A time out was conducted at 09:35, prior to the start of the procedure. A Minimum amount of bleeding was controlled with Pressure. The procedure was tolerated well with a pain level of 0 throughout and a pain level of 0 following the procedure. Post Debridement Measurements: 0.3cm length x 0.5cm width x 0.2cm depth; 0.024cm^3 volume. Character of Wound/Ulcer Post Debridement is improved. Severity of Tissue Post Debridement is: Fat layer exposed. Post procedure Diagnosis Wound #1: Same as Pre-Procedure General Notes: Scribed for Dr Heber [place] by Sharyn Creamer, RN. Pre-procedure diagnosis of Wound #1 is a Diabetic Wound/Ulcer of the Lower Extremity located on the Right Metatarsal head fifth . There was a T Interior and spatial designer Procedure by Kalman Shan, DO. Post procedure Diagnosis Wound #1: Same as Pre-Procedure Wound #2 Pre-procedure diagnosis of Wound #2 is a Diabetic Wound/Ulcer of the Lower Extremity located on the Left Metatarsal head fifth .Severity of Tissue Pre Debridement is: Fat layer exposed. There was a Excisional Skin/Subcutaneous Tissue Debridement with a total area of 0.12 sq cm performed by Kalman Shan, DO. With the following instrument(s): Curette to remove Non-Viable tissue/material. Material removed includes Callus, Subcutaneous Tissue, and Slough after achieving pain control using Lidocaine 4% Topical Solution. No specimens were taken. A time out was conducted at 09:35, prior to the start of the SHAIN, CERASOLI ([id number]) [id number] Page 8 of 10 procedure. A Minimum amount of bleeding was controlled with Pressure. The procedure  was tolerated well with a pain level of 0 throughout and a pain level of 0 following the procedure. Post Debridement Measurements: 0.3cm length x 0.4cm width x 0.2cm depth; 0.019cm^3 volume. Character of Wound/Ulcer Post Debridement is improved. Severity of Tissue Post Debridement is: Fat layer exposed. Post procedure Diagnosis Wound #2: Same as Pre-Procedure General Notes: Scribed for Dr Heber [place] by Sharyn Creamer, RN. Plan Follow-up Appointments: Return Appointment in 1 week. - Thursday with Dr. Heber [place] on 02/07/23 @ 10:15 w/ Allayne Butcher Rm # 9 CAST CHANGE ONLY Return Appointment in 2 weeks. - Thursday with Dr. Heber [place] 1100 02/14/2023 Room 8 Other: - Will call you with MRI and x-ray results once they return. Anesthetic: (In clinic) Topical Lidocaine 4% applied to wound bed Bathing/ Shower/ Hygiene: May shower with protection but do not get wound dressing(s) wet. Protect dressing(s) with water repellant cover (for example, large plastic bag) or a cast cover and may then take shower. Off-Loading: T Contact Cast to Right Lower Extremity - wear black boot with cast at all times otal Open toe surgical shoe to: - surgical shoes with PEG ASSIST to left shoes. Wear while walking and standing. DO not walk barefoot or just in socks. Additional Orders / Instructions: Follow Nutritious Diet - CLOSELY MONITOR BLOOD SUGAR. The following medication(s) was prescribed: lidocaine topical 4 % cream 0 0  cream topical once daily for prior to debridement was prescribed at facility WOUND #1: - Metatarsal head fifth Wound Laterality: Right Cleanser: Wound Cleanser (Generic) 1 x Per Day/30 Days Discharge Instructions: Cleanse the wound with wound cleanser prior to applying a clean dressing using gauze sponges, not tissue or cotton balls. Peri-Wound Care: Skin Prep (Generic) 1 x Per Day/30 Days Discharge Instructions: Use skin prep as directed Topical: Gentamicin 1 x Per Day/30 Days Discharge Instructions: As directed  by physician Topical: Mupirocin Ointment 1 x Per Day/30 Days Discharge Instructions: Apply Mupirocin (Bactroban) as instructed Prim Dressing: Hydrofera Blue Ready Transfer Foam, 2.5x2.5 (in/in) 1 x Per Day/30 Days ary Discharge Instructions: Apply directly to wound bed as directed Secondary Dressing: Optifoam Non-Adhesive Dressing, 4x4 in (Generic) 1 x Per Day/30 Days Discharge Instructions: Apply ONE LAYER OF FOAM D Secondary Dressing: Woven Gauze Sponges 2x2 in (Generic) 1 x Per Day/30 Days Discharge Instructions: Apply over primary dressing as directed. Secured With: Child psychotherapist, Sterile 2x75 (in/in) (Generic) 1 x Per Day/30 Days Discharge Instructions: Secure with stretch gauze as directed. Secured With: 26M Medipore H Soft Cloth Surgical T ape, 4 x 10 (in/yd) (Generic) 1 x Per Day/30 Days Discharge Instructions: Secure with tape as directed. WOUND #2: - Metatarsal head fifth Wound Laterality: Left Cleanser: Wound Cleanser (Generic) 1 x Per Day/30 Days Discharge Instructions: Cleanse the wound with wound cleanser prior to applying a clean dressing using gauze sponges, not tissue or cotton balls. Peri-Wound Care: Skin Prep (Generic) 1 x Per Day/30 Days Discharge Instructions: Use skin prep as directed Prim Dressing: MediHoney Gel, tube 1.5 (oz) 1 x Per Day/30 Days ary Discharge Instructions: Apply to wound bed as instructed Prim Dressing: Sorbalgon AG Dressing 2x2 (in/in) (Generic) 1 x Per Day/30 Days ary Discharge Instructions: apply to wound bed. Secondary Dressing: Optifoam Non-Adhesive Dressing, 4x4 in (Generic) 1 x Per Day/30 Days Discharge Instructions: Apply ONE LAYER OF FOAM D Secondary Dressing: Woven Gauze Sponges 2x2 in (Generic) 1 x Per Day/30 Days Discharge Instructions: Apply over primary dressing as directed. Secured With: Child psychotherapist, Sterile 2x75 (in/in) (Generic) 1 x Per Day/30 Days Discharge Instructions: Secure with stretch  gauze as directed. Secured With: 26M Medipore H Soft Cloth Surgical T ape, 4 x 10 (in/yd) (Generic) 1 x Per Day/30 Days Discharge Instructions: Secure with tape as directed. 1. In office sharp debridement 2. T contact cast placed in standard fashion otal 3. Antibiotic ointment with Hydrofera Blue under TCC 4. Medihoney and silver alginate to the left foot 5. Aggressive offloadingoosurgical shoe with peg assistooleft foot Electronic Signature(s) Signed: 02/05/2023 11:58:04 AM By: Kalman Shan DO Entered By: Kalman Shan on 02/05/2023 10:06:48 [name] ([id number]) [id number] Page 9 of 10 -------------------------------------------------------------------------------- HxROS Details Patient Name: Date of Service: [name], [name] 02/05/2023 9:00 A M Medical Record Number: [id number] Patient Account Number: [id number] Date of Birth/Sex: Treating RN: October 14, 1996 (27 y.o. M) Primary Care Provider: Mina Marble Other Clinician: Referring Provider: Treating Provider/Extender: Olivia Mackie Weeks in Treatment: 5 Information Obtained From Patient Chart Cardiovascular Medical History: Positive for: Hypertension Past Medical History Notes: heart murmur Endocrine Medical History: Positive for: Type I Diabetes Negative for: Type II Diabetes Time with diabetes: 1 year ago Treated with: Insulin, Diet Blood sugar tested every day: Yes Tested : TID Neurologic Medical History: Positive for: Neuropathy Immunizations Pneumococcal Vaccine: Received Pneumococcal Vaccination: No Implantable Devices None Hospitalization / Surgery History Type of Hospitalization/Surgery R foot wound debridement and closure 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 Family and Social History Unknown History: Yes; Never smoker; Marital Status - Single; Alcohol Use: Never; Drug Use: No History; Caffeine Use: Rarely; Financial Concerns: No; Food, Clothing or  Shelter Needs: No; Support System Lacking: No; Transportation Concerns: No Electronic Signature(s) Signed: 02/05/2023 11:58:04 AM By: Kalman Shan DO Entered By: Kalman Shan on 02/05/2023 10:01:26 -------------------------------------------------------------------------------- Total Contact Cast Details Patient Name: Date of Service: [name], [name] 02/05/2023 9:00 A M Medical Record Number: [id number] Patient Account Number: [id number] Date of Birth/Sex: Treating RN: July 17, 1996 (27 y.o. [name] Primary Care Provider: Mina Marble Other Clinician: Referring Provider: Treating Provider/Extender: Olivia Mackie Weeks in Treatment: 5 T Contact Cast Applied for Wound Assessment: otal Wound #1 Right Metatarsal head fifth Performed By: Physician Kalman Shan, DO Post Procedure Diagnosis RHOEN, LANGILL ([id number]) [id number] Page 10 of 10 Same as Pre-procedure Electronic Signature(s) Signed: 02/05/2023 11:58:04 AM By: Kalman Shan DO Signed: 02/06/2023 3:48:00 PM By: Sharyn Creamer RN, BSN Entered By: Sharyn Creamer on 02/05/2023 09:41:23 -------------------------------------------------------------------------------- [place] Details Patient Name: Date of Service: [name], [name] 02/05/2023 Medical Record Number: [id number] Patient Account Number: [id number] Date of Birth/Sex: Treating RN: 12/27/1995 (27 y.o. 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 E10.40 Type 1 diabetes mellitus with diabetic neuropathy, unspecified E10.621 Type 1 diabetes mellitus  with foot ulcer Facility Procedures : CPT4 Code: [id number] Description: [id number] - DEB SUBQ TISSUE 20 SQ CM/< ICD-10 Diagnosis Description L97.518 Non-pressure chronic ulcer of other part of right foot with other specified sev L97.528 Non-pressure chronic ulcer of other part of left foot with other specified seve Modifier: erity rity Quantity: 1 Physician Procedures : CPT4 Code Description Modifier [id number] - WC PHYS LEVEL 3 - EST PT 25 ICD-10 Diagnosis Description L97.528 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ty Quantity: 1 : [id number] - WC PHYS SUBQ TISS 20 SQ CM ICD-10 Diagnosis Description L97.518 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ity L97.528 Non-pressure chronic ulcer of other part of left foot with other specified  severity Quantity: 1 Electronic Signature(s) Signed: 02/05/2023 11:58:04 AM By: Kalman Shan DO Entered By: Kalman Shan on 02/05/2023 10:13:17

## 2023-02-07 NOTE — Progress Notes (Signed)
LYNKOLN, WEIGMAN ([id number]) [phone].pdf Page 1 of 8 Visit Report for 02/05/2023 Arrival Information Details Patient Name: Date of Service: [name], [name] 02/05/2023 9:00 A M Medical Record Number: [id number] Patient Account Number: [id number] Date of Birth/Sex: Treating RN: [birth year] (27 y.o. Mare Ferrari Primary Care Claborn Janusz: Mina Marble Other Clinician: Referring Mehlani Blankenburg: Treating Sharise Lippy/Extender: Olivia Mackie Weeks in Treatment: 5 Visit Information History Since Last Visit Added or deleted any medications: No Patient Arrived: Ambulatory Any new allergies or adverse reactions: No Arrival Time: 09:16 Had a fall or experienced change in No Accompanied By: self activities of daily living that may affect Transfer Assistance: None risk of falls: Patient Identification Verified: Yes Signs or symptoms of abuse/neglect since last visito No Secondary Verification Process Completed: Yes Hospitalized since last visit: No Patient Requires Transmission-Based Precautions: Yes Implantable device outside of the clinic excluding No Transmission-Based Precautions: Contact MRSA cellular tissue based products placed in the center Patient Has Alerts: No since last visit: Has Dressing in Place as Prescribed: Yes Pain Present Now: No Electronic Signature(s) Signed: 02/06/2023 3:48:00 PM By: Sharyn Creamer RN, BSN Entered By: Sharyn Creamer on 02/05/2023 09:16:33 -------------------------------------------------------------------------------- Encounter Discharge Information Details Patient Name: Date of Service: [name]. 02/05/2023 9:00 A M Medical Record Number: [id number] Patient Account Number: [id number] Date of Birth/Sex: Treating RN: 12-Feb-1996 (27 y.o. Mare Ferrari Primary Care Mairi Stagliano: Mina Marble Other Clinician: Referring Alexandrya Chim: Treating Kairee Isa/Extender: Olivia Mackie Weeks  in Treatment: 5 Encounter Discharge Information Items Post Procedure Vitals Discharge Condition: Stable Temperature (F): 99.2 Ambulatory Status: Ambulatory Pulse (bpm): 91 Discharge Destination: Home Respiratory Rate (breaths/min): 18 Transportation: Private Auto Blood Pressure (mmHg): 121/81 Accompanied By: self Schedule Follow-up Appointment: Yes Clinical Summary of Care: Patient Declined Electronic Signature(s) Signed: 02/06/2023 3:48:00 PM By: Sharyn Creamer RN, BSN Entered By: Sharyn Creamer on 02/05/2023 12:02:26 HAMED, FINKENBINDER ([id number]) [id number] Page 2 of 8 -------------------------------------------------------------------------------- Lower Extremity Assessment Details Patient Name: Date of Service: Lucas, Brown 02/05/2023 9:00 A M Medical Record Number: [id number] Patient Account Number: [id number] Date of Birth/Sex: Treating RN: 03/12/1996 (27 y.o. Mare Ferrari Primary Care Graci Hulce: Mina Marble Other Clinician: Referring Svara Twyman: Treating Lois Slagel/Extender: Olivia Mackie Weeks in Treatment: 5 Edema Assessment Assessed: [Left: No] [Right: No] Edema: [Left: No] [Right: No] Calf Left: Right: Point of Measurement: 34 cm From Medial Instep 37.5 cm 37 cm Ankle Left: Right: Point of Measurement: 9 cm From Medial Instep 21.8 cm 22 cm Vascular Assessment Pulses: Dorsalis Pedis Palpable: [Left:Yes] [Right:Yes] Electronic Signature(s) Signed: 02/06/2023 3:48:00 PM By: Sharyn Creamer RN, BSN Entered By: Sharyn Creamer on 02/05/2023 09:20:22 -------------------------------------------------------------------------------- Multi Wound Chart Details Patient Name: Date of Service: [name]. 02/05/2023 9:00 A M Medical Record Number: [id number] Patient Account Number: [id number] Date of Birth/Sex: Treating RN: Jul 02, 1996 (27 y.o. M) Primary Care Georgio Hattabaugh: Mina Marble Other  Clinician: Referring Remer Couse: Treating Delylah Stanczyk/Extender: Olivia Mackie Weeks in Treatment: 5 Vital Signs Height(in): Pulse(bpm): 91 Weight(lbs): Blood Pressure(mmHg): 121/81 Body Mass Index(BMI): Temperature(F): 99.2 Respiratory Rate(breaths/min): 18 [1:Photos:] [N/A:N/A [id number] Page 3 of 8] Right Metatarsal head fifth Left Metatarsal head fifth N/A Wound Location: Gradually Appeared Gradually Appeared N/A Wounding Event: Diabetic Wound/Ulcer of the Lower Diabetic Wound/Ulcer of the Lower N/A Primary Etiology: Extremity Extremity Hypertension, Type I Diabetes, Hypertension, Type I Diabetes, N/A Comorbid History: Neuropathy Neuropathy 12/25/2019 09/24/2022 N/A Date Acquired: 5 5 N/A Weeks of Treatment: Open Open N/A Wound Status: No No N/A Wound Recurrence: Yes Yes N/A Pending  A mputation on Presentation: 0.3x0.5x0.2 0.3x0.4x0.2 N/A Measurements L x W x D (cm) 0.118 0.094 N/A A (cm) : rea 0.024 0.019 N/A Volume (cm) : -66.20% -100.00% N/A % Reduction in A rea: -71.40% -35.70% N/A % Reduction in Volume: Grade 2 Grade 2 N/A Classification: Medium Medium N/A Exudate A mount: Serosanguineous Serosanguineous N/A Exudate Type: red, brown red, brown N/A Exudate Color: Distinct, outline attached Distinct, outline attached N/A Wound Margin: Large (67-100%) Large (67-100%) N/A Granulation A mount: Red Red N/A Granulation Quality: Small (1-33%) Small (1-33%) N/A Necrotic A mount: Fat Layer (Subcutaneous Tissue): Yes Fat Layer (Subcutaneous Tissue): Yes N/A Exposed Structures: Fascia: No Fascia: No Tendon: No Tendon: No Muscle: No Muscle: No Joint: No Joint: No Bone: No Bone: No None None N/A Epithelialization: Debridement - Excisional Debridement - Excisional N/A Debridement: Pre-procedure Verification/Time Out 09:35 09:35 N/A Taken: Lidocaine 4% Topical Solution Lidocaine 4% Topical Solution  N/A Pain Control: Callus, Subcutaneous, Slough Callus, Subcutaneous, Slough N/A Tissue Debrided: Skin/Subcutaneous Tissue Skin/Subcutaneous Tissue N/A Level: 0.15 0.12 N/A Debridement A (sq cm): rea Curette Curette N/A Instrument: Minimum Minimum N/A Bleeding: Pressure Pressure N/A Hemostasis A chieved: 0 0 N/A Procedural Pain: 0 0 N/A Post Procedural Pain: Procedure was tolerated well Procedure was tolerated well N/A Debridement Treatment Response: 0.3x0.5x0.2 0.3x0.4x0.2 N/A Post Debridement Measurements L x W x D (cm) 0.024 0.019 N/A Post Debridement Volume: (cm) Callus: Yes Callus: Yes N/A Periwound Skin Texture: Excoriation: No Excoriation: No Induration: No Induration: No Crepitus: No Crepitus: No Rash: No Rash: No Scarring: No Scarring: No Maceration: No Dry/Scaly: Yes N/A Periwound Skin Moisture: Dry/Scaly: No Maceration: No Atrophie Blanche: No Atrophie Blanche: No N/A Periwound Skin Color: Cyanosis: No Cyanosis: No Ecchymosis: No Ecchymosis: No Erythema: No Erythema: No Hemosiderin Staining: No Hemosiderin Staining: No Mottled: No Mottled: No Pallor: No Pallor: No Rubor: No Rubor: No Debridement Debridement N/A Procedures Performed: T Contact Cast otal Treatment Notes Electronic Signature(s) Signed: 02/05/2023 11:58:04 AM By: Kalman Shan DO Entered By: Kalman Shan on 02/05/2023 10:00:41 Multi-Disciplinary Care Plan Details -------------------------------------------------------------------------------- [name] ([id number]) [id number] Page 4 of 8 Patient Name: Date of Service: JUBAL, ESHBAUGH 02/05/2023 9:00 A M Medical Record Number: [id number] Patient Account Number: [id number] Date of Birth/Sex: Treating RN: March 11, 1996 (27 y.o. Mare Ferrari Primary Care Yasmyn Bellisario: Mina Marble Other Clinician: Referring Daijon Wenke: Treating Satomi Buda/Extender: Olivia Mackie Weeks in Treatment: 5 Active Inactive Pain, Acute or Chronic Nursing Diagnoses: Pain, acute or chronic: actual or potential Potential alteration in comfort, pain Goals: Patient will verbalize adequate pain control and receive pain control interventions during procedures as needed Date Initiated: 12/27/2022 Target Resolution Date: 02/22/2023 Goal Status: Active Patient/caregiver will verbalize comfort level met Date Initiated: 12/27/2022 Target Resolution Date: 02/22/2023 Goal Status: Active Interventions: Complete pain assessment as per visit requirements Encourage patient to take pain medications as prescribed Provide education on pain management Treatment Activities: Administer pain control measures as ordered : 12/27/2022 Notes: Wound/Skin Impairment Nursing Diagnoses: Knowledge deficit related to ulceration/compromised skin integrity Goals: Patient/caregiver will verbalize understanding of skin care regimen Date Initiated: 12/27/2022 Target Resolution Date: 02/22/2023 Goal Status: Active Interventions: Assess patient/caregiver ability to perform ulcer/skin care regimen upon admission and as needed Assess ulceration(s) every visit Provide education on ulcer and skin care Treatment Activities: Skin care regimen initiated : 12/27/2022 Topical wound management initiated : 12/27/2022 Notes: Electronic Signature(s) Signed: 02/06/2023 3:48:00 PM By: Sharyn Creamer RN, BSN Entered By: Sharyn Creamer on 02/05/2023 09:31:59 -------------------------------------------------------------------------------- Pain Assessment Details Patient Name:  Date of Service: CULLIN, GUENETTE 02/05/2023 9:00 A M Medical Record Number: [id number] Patient Account Number: [id number] Date of Birth/Sex: Treating RN: 12-13-1996 (27 y.o. Mare Ferrari Primary Care Taetum Flewellen: Mina Marble Other Clinician: Referring Jacolyn Joaquin: Treating Ashan Cueva/Extender: Olivia Mackie Weeks in  Treatment: 5 MARX, GERSHON ([id number]) [id number] Page 5 of 8 Active Problems Location of Pain Severity and Description of Pain Patient Has Paino No Site Locations Pain Management and Medication Current Pain Management: Electronic Signature(s) Signed: 02/06/2023 3:48:00 PM By: Sharyn Creamer RN, BSN Entered By: Sharyn Creamer on 02/05/2023 09:18:41 -------------------------------------------------------------------------------- Patient/Caregiver Education Details Patient Name: Date of Service: Boxell, Johnnathan E. 2/13/2024andnbsp9:00 [place] Record Number: [id number] Patient Account Number: [id number] Date of Birth/Gender: Treating RN: 07/27/1996 (27 y.o. Mare Ferrari Primary Care Physician: Mina Marble Other Clinician: Referring Physician: Treating Physician/Extender: Burns [place] in Treatment: 5 Education Assessment Education Provided To: Patient Education Topics Provided Wound/Skin Impairment: Methods: Explain/Verbal Responses: State content correctly Electronic Signature(s) Signed: 02/06/2023 3:48:00 PM By: Sharyn Creamer RN, BSN Entered By: Sharyn Creamer on 02/05/2023 09:32:18 LEVIN, PULLING ([id number] Page 6 of 8 -------------------------------------------------------------------------------- Wound Assessment Details Patient Name: Date of Service: DAICHI, GRAMZA 02/05/2023 9:00 A M Medical Record Number: [id number] Patient Account Number: [id number] Date of Birth/Sex: Treating RN: [birth year] (26 y.o. Mare Ferrari Primary Care Rifka Ramey: Mina Marble Other Clinician: Referring Xenia Nile: Treating Anneta Rounds/Extender: Olivia Mackie Weeks in Treatment: 5 Wound Status Wound Number: 1 Primary Etiology: Diabetic Wound/Ulcer of the Lower Extremity Wound Location: Right Metatarsal head fifth Wound Status: Open Wounding Event:  Gradually Appeared Comorbid History: Hypertension, Type I Diabetes, Neuropathy Date Acquired: 12/25/2019 Weeks Of Treatment: 5 Clustered Wound: No Pending Amputation On Presentation Photos Wound Measurements Length: (cm) 0.3 Width: (cm) 0.5 Depth: (cm) 0.2 Area: (cm) 0.118 Volume: (cm) 0.024 % Reduction in Area: -66.2% % Reduction in Volume: -71.4% Epithelialization: None Tunneling: No Undermining: No Wound Description Classification: Grade 2 Wound Margin: Distinct, outline attached Exudate Amount: Medium Exudate Type: Serosanguineous Exudate Color: red, brown Foul Odor After Cleansing: No Slough/Fibrino Yes Wound Bed Granulation Amount: Large (67-100%) Exposed Structure Granulation Quality: Red Fascia Exposed: No Necrotic Amount: Small (1-33%) Fat Layer (Subcutaneous Tissue) Exposed: Yes Necrotic Quality: Adherent Slough Tendon Exposed: No Muscle Exposed: No Joint Exposed: No Bone Exposed: No Periwound Skin Texture Texture Color No Abnormalities Noted: No No Abnormalities Noted: No Callus: Yes Atrophie Blanche: No Crepitus: No Cyanosis: No Excoriation: No Ecchymosis: No Induration: No Erythema: No Rash: No Hemosiderin Staining: No Scarring: No Mottled: No Pallor: No Moisture Rubor: No No Abnormalities Noted: No Dry / Scaly: No Maceration: No Electronic Signature(s) Signed: 02/06/2023 3:48:00 PM By: Sharyn Creamer RN, BSN Entered By: Sharyn Creamer on 02/05/2023 09:24:08 [name] ([id number]) [id number] Page 7 of 8 -------------------------------------------------------------------------------- Wound Assessment Details Patient Name: Date of Service: DEMAJE, CUARTAS 02/05/2023 9:00 A M Medical Record Number: [id number] Patient Account Number: [id number] Date of Birth/Sex: Treating RN: 1996-02-28 (27 y.o. Mare Ferrari Primary Care Ember Henrikson: Mina Marble Other Clinician: Referring Melayna Robarts: Treating  Maren Wiesen/Extender: Olivia Mackie Weeks in Treatment: 5 Wound Status Wound Number: 2 Primary Etiology: Diabetic Wound/Ulcer of the Lower Extremity Wound Location: Left Metatarsal head fifth Wound Status: Open Wounding Event: Gradually Appeared Comorbid History: Hypertension, Type I Diabetes, Neuropathy Date Acquired: 09/24/2022 Weeks Of Treatment: 5 Clustered Wound: No Pending Amputation On Presentation Photos Wound Measurements Length: (cm) 0. Width: (cm) 0. Depth: (cm) 0. Area: (cm) 0 Volume: (cm) 0  3 % Reduction in Area: -100% 4 % Reduction in Volume: -35.7% 2 Epithelialization: None .094 Tunneling: No .019 Undermining: No Wound Description Classification: Grade 2 Wound Margin: Distinct, outline attached Exudate Amount: Medium Exudate Type: Serosanguineous Exudate Color: red, brown Foul Odor After Cleansing: No Slough/Fibrino Yes Wound Bed Granulation Amount: Large (67-100%) Exposed Structure Granulation Quality: Red Fascia Exposed: No Necrotic Amount: Small (1-33%) Fat Layer (Subcutaneous Tissue) Exposed: Yes Necrotic Quality: Adherent Slough Tendon Exposed: No Muscle Exposed: No Joint Exposed: No Bone Exposed: No Periwound Skin Texture Texture Color No Abnormalities Noted: No No Abnormalities Noted: No Callus: Yes Atrophie Blanche: No Crepitus: No Cyanosis: No Excoriation: No Ecchymosis: No Induration: No Erythema: No Rash: No Hemosiderin Staining: No Scarring: No Mottled: No Pallor: No Moisture Rubor: No No Abnormalities Noted: No HUNTLEY, NARAIN ([id number]) [id number] Page 8 of 8 Dry / Scaly: Yes Maceration: No Electronic Signature(s) Signed: 02/06/2023 3:48:00 PM By: Sharyn Creamer RN, BSN Entered By: Sharyn Creamer on 02/05/2023 09:27:50 -------------------------------------------------------------------------------- Vitals Details Patient Name: Date of Service: [name].  02/05/2023 9:00 A M Medical Record Number: [id number] Patient Account Number: [id number] Date of Birth/Sex: Treating RN: [birth year] (27 y.o. Mare Ferrari Primary Care Carlyn Lemke: Mina Marble Other Clinician: Referring Tawania Daponte: Treating Katrianna Friesenhahn/Extender: Olivia Mackie Weeks in Treatment: 5 Vital Signs Time Taken: 09:18 Temperature (F): 99.2 Pulse (bpm): 91 Respiratory Rate (breaths/min): 18 Blood Pressure (mmHg): 121/81 Reference Range: 80 - 120 mg / dl Electronic Signature(s) Signed: 02/06/2023 3:48:00 PM By: Sharyn Creamer RN, BSN Entered By: Sharyn Creamer on 02/05/2023 09:18:30

## 2023-02-09 NOTE — Progress Notes (Signed)
PEIRCE, VERHELST ([id number]) [id number] Page 1 of 9 Visit Report for 02/07/2023 Chief Complaint Document Details Patient Name: Date of Service: [name], [name] 02/07/2023 10:15 A M Medical Record Number: [id number] Patient Account Number: [id number] Date of Birth/Sex: Treating RN: [birth year] (27 y.o. M) Primary Care Provider: Mina Marble Other Clinician: Referring Provider: Treating Provider/Extender: Olivia Mackie Weeks in Treatment: 6 Information Obtained from: Patient Chief Complaint 12/27/2022; bilateral diabetic foot wounds Electronic Signature(s) Signed: 02/07/2023 12:26:40 PM By: Kalman Shan DO Entered By: Kalman Shan on 02/07/2023 11:40:15 -------------------------------------------------------------------------------- Debridement Details Patient Name: Date of Service: [name] 02/07/2023 10:15 A M Medical Record Number: [id number] Patient Account Number: [id number] Date of Birth/Sex: Treating RN: [birth year] (27 y.o. Burnadette Pop, Lauren Primary Care Provider: Mina Marble Other Clinician: Referring Provider: Treating Provider/Extender: Olivia Mackie Weeks in Treatment: 6 Debridement Performed for Assessment: Wound #2 Left Metatarsal head fifth Performed By: Physician Kalman Shan, DO Debridement Type: Debridement Severity of Tissue Pre Debridement: Fat layer exposed Level of Consciousness (Pre-procedure): Awake and Alert Pre-procedure Verification/Time Out Yes - 11:03 Taken: Start Time: 11:03 Pain Control: Lidocaine T Area Debrided (L x W): otal 0.3 (cm) x 0.5 (cm) = 0.15 (cm) Tissue and other material debrided: Viable, Callus, Slough, Subcutaneous, Slough Level: Skin/Subcutaneous Tissue Debridement Description: Excisional Instrument: Curette Bleeding: Minimum Hemostasis Achieved: Pressure End Time: 11:03 Procedural Pain: 0 Post Procedural Pain: 0 Response to  Treatment: Procedure was tolerated well Level of Consciousness (Post- Awake and Alert procedure): Post Debridement Measurements of Total Wound Length: (cm) 0.3 Width: (cm) 0.5 Depth: (cm) 0.2 Volume: (cm) 0.024 Character of Wound/Ulcer Post Debridement: Improved Severity of Tissue Post Debridement: Fat layer exposed GUENTHER, GEILING ([id number]) [id number] Page 2 of 9 Post Procedure Diagnosis Same as Pre-procedure Electronic Signature(s) Signed: 02/07/2023 12:26:40 PM By: Kalman Shan DO Signed: 02/08/2023 12:08:46 PM By: Rhae Hammock RN Entered By: Rhae Hammock on 02/07/2023 11:04:05 -------------------------------------------------------------------------------- HPI Details Patient Name: Date of Service: DIAN, BERNA 02/07/2023 10:15 A M Medical Record Number: [id number] Patient Account Number: [id number] Date of Birth/Sex: Treating RN: 03-01-1996 (27 y.o. M) Primary Care Provider: Mina Marble Other Clinician: Referring Provider: Treating Provider/Extender: Olivia Mackie Weeks in Treatment: 6 History of Present Illness HPI Description: 12/27/2022 Mr. 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 He has been following with podiatry for this issue. He reports using Silvadene to the areas. Currently not using anything for offloading. He states that the right foot wound has been present for the past 1-1/2 years. He states that the left foot wound appeared about 3 months ago. He has not had recent imaging of the areas. Currently denies systemic signs of infection. 01/03/2023; patient presents for follow-up. He has been using Medihoney and silver alginate to the wound beds. He has been using his surgical shoes with  peg assist. He has not been scheduled for an MRI yet. There is been improvement in wound healing. He currently denies signs of infection. 1/18; patient presents for follow-up. He is scheduled to have his MRI on 1/29. He has been using silver alginate with Medihoney to the wound beds. He has been using his surgical shoes with peg assist. He has no issues or complaints today. He denies signs of infection. 1/25; patient presents for follow-up. He has been using silver alginate with  Medihoney to the wound beds. He is starting to develop a new wound to the right lateral foot adjacent to the original right foot wound. He has been using his surgical shoes with peg assist. He currently denies signs of infection. Scheduled for MRI in 4 days. 2/1; patient presents for follow-up. Has been using silver alginate with Medihoney to the wound beds. Unfortunately his insurance denied his MRI a day prior to when it was scheduled. This was reapproved but only to the right foot with a prior Auth. He currently denies signs of infection. 2/8; patient presents for follow-up. He has been using silver alginate and Medihoney to the wound beds. He has no issues or complaints today. He obtained his MRI of the right foot and x-ray of the right foot yesterday. Read at time patient was seen had not resulted. 2/13; patient presents for follow-up. His right foot MRI showed no evidence of osteomyelitis. Plan is for the total contact cast and patient was agreeable to this. He has been using silver alginate and Medihoney to the wound beds. He denies signs of infection. 2/15; patient presents for obligatory cast change. He tolerated he tolerated the total contact cast well. He has no issues or complaints today. He continues to use silver alginate and Medihoney to the left foot wound. Electronic Signature(s) Signed: 02/07/2023 12:26:40 PM By: Kalman Shan DO Entered By: Kalman Shan on 02/07/2023  11:40:56 -------------------------------------------------------------------------------- Physical Exam Details Patient Name: Date of Service: [name], PINSON 02/07/2023 10:15 A M Medical Record Number: [id number] Patient Account Number: [id number] Date of Birth/Sex: Treating RN: 1996/08/03 (27 y.o. M) Primary Care Provider: Mina Marble Other Clinician: Referring Provider: Treating Provider/Extender: Olivia Mackie Weeks in Treatment: 6 LAJAVION, DRONEN ([id number]) [id number] Page 3 of 9 Constitutional respirations regular, non-labored and within target range for patient.. Cardiovascular 2+ dorsalis pedis/posterior tibialis pulses. Psychiatric pleasant and cooperative. Notes Bilateral fifth met head wounds to the feet. Right foot wound with granulation tissue. T the left foot wound Granulation tissue With nonviable tissue and callus. o No signs of infection including increased warmth, erythema or purulent drainage. Electronic Signature(s) Signed: 02/07/2023 12:26:40 PM By: Kalman Shan DO Entered By: Kalman Shan on 02/07/2023 11:42:02 -------------------------------------------------------------------------------- Physician Orders Details Patient Name: Date of Service: TYLON, MACIOCE 02/07/2023 10:15 A M Medical Record Number: [id number] Patient Account Number: [id number] Date of Birth/Sex: Treating RN: May 19, 1996 (27 y.o. Hessie Diener Primary Care Provider: Mina Marble Other Clinician: Referring Provider: Treating Provider/Extender: Olivia Mackie Weeks in Treatment: 6 Verbal / Phone Orders: No Diagnosis Coding ICD-10 Coding Code Description L97.518 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ity L97.528 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ty E10.40 Type 1 diabetes mellitus with diabetic neuropathy,  unspecified E10.621 Type 1 diabetes mellitus with foot ulcer Follow-up Appointments ppointment in 1 week. - Thursday with Dr. Heber [place] 1100 02/14/2023 Room 8 extra time TCC Return A Anesthetic (In clinic) Topical Lidocaine 4% applied to wound bed Bathing/ Shower/ Hygiene May shower with protection but do not get wound dressing(s) wet. Protect dressing(s) with water repellant cover (for example, large plastic bag) or a cast cover and may then take shower. Off-Loading Total Contact Cast to Right Lower Extremity - wear black boot with cast at all times Open toe surgical shoe to: - surgical shoes with PEG ASSIST to left shoes. Wear while walking and standing. DO not walk barefoot or just in socks. Additional Orders /  Instructions Follow Nutritious Diet - CLOSELY MONITOR BLOOD SUGAR. Wound Treatment Wound #1 - Metatarsal head fifth Wound Laterality: Right Cleanser: Wound Cleanser (Generic) 1 x Per Day/30 Days Discharge Instructions: Cleanse the wound with wound cleanser prior to applying a clean dressing using gauze sponges, not tissue or cotton balls. Peri-Wound Care: Skin Prep (Generic) 1 x Per Day/30 Days Discharge Instructions: Use skin prep as directed Topical: Gentamicin 1 x Per Day/30 Days Discharge Instructions: As directed by physician Topical: Mupirocin Ointment 1 x Per Day/30 Days Discharge Instructions: Apply Mupirocin (Bactroban) as instructed JAMAREON, FRANCKE ([id number]) [id number] Page 4 of 9 Prim Dressing: Hydrofera Blue Ready Transfer Foam, 2.5x2.5 (in/in) 1 x Per Day/30 Days ary Discharge Instructions: Apply directly to wound bed as directed Secondary Dressing: Optifoam Non-Adhesive Dressing, 4x4 in (Generic) 1 x Per Day/30 Days Discharge Instructions: Apply ONE LAYER OF FOAM D Secondary Dressing: Woven Gauze Sponges 2x2 in (Generic) 1 x Per Day/30 Days Discharge Instructions: Apply over primary dressing as directed. Secured With: Hotel manager, Sterile 2x75 (in/in) (Generic) 1 x Per Day/30 Days Discharge Instructions: Secure with stretch gauze as directed. Secured With: 31M Medipore H Soft Cloth Surgical T ape, 4 x 10 (in/yd) (Generic) 1 x Per Day/30 Days Discharge Instructions: Secure with tape as directed. Wound #2 - Metatarsal head fifth Wound Laterality: Left Cleanser: Wound Cleanser (Generic) 1 x Per Day/30 Days Discharge Instructions: Cleanse the wound with wound cleanser prior to applying a clean dressing using gauze sponges, not tissue or cotton balls. Peri-Wound Care: Skin Prep (Generic) 1 x Per Day/30 Days Discharge Instructions: Use skin prep as directed Prim Dressing: MediHoney Gel, tube 1.5 (oz) 1 x Per Day/30 Days ary Discharge Instructions: Apply to wound bed as instructed Prim Dressing: Sorbalgon AG Dressing 2x2 (in/in) (Generic) 1 x Per Day/30 Days ary Discharge Instructions: apply to wound bed. Secondary Dressing: Optifoam Non-Adhesive Dressing, 4x4 in (Generic) 1 x Per Day/30 Days Discharge Instructions: Apply ONE LAYER OF FOAM D Secondary Dressing: Woven Gauze Sponges 2x2 in (Generic) 1 x Per Day/30 Days Discharge Instructions: Apply over primary dressing as directed. Secured With: Child psychotherapist, Sterile 2x75 (in/in) (Generic) 1 x Per Day/30 Days Discharge Instructions: Secure with stretch gauze as directed. Secured With: 31M Medipore H Soft Cloth Surgical T ape, 4 x 10 (in/yd) (Generic) 1 x Per Day/30 Days Discharge Instructions: Secure with tape as directed. Electronic Signature(s) Signed: 02/07/2023 12:26:40 PM By: Kalman Shan DO Entered By: Kalman Shan on 02/07/2023 11:42:23 -------------------------------------------------------------------------------- Problem List Details Patient Name: Date of Service: PHOENIX, DREY 02/07/2023 10:15 A M Medical Record Number: [id number] Patient Account Number: [id number] Date of Birth/Sex: Treating RN: Jan 01, 1996  (27 y.o. Hessie Diener Primary Care Provider: Mina Marble Other Clinician: Referring Provider: Treating Provider/Extender: Olivia Mackie Weeks in Treatment: 6 Active Problems ICD-10 Encounter Code Description Active Date MDM Diagnosis L97.518 Non-pressure chronic ulcer of other part of right foot with other specified 12/27/2022 No Yes severity L97.528 Non-pressure chronic ulcer of other part of left foot with other specified 12/27/2022 No Yes severity KIRO, STEMLER ([id number]) [id number] Page 5 of 9 E10.40 Type 1 diabetes mellitus with diabetic neuropathy, unspecified 12/27/2022 No Yes E10.621 Type 1 diabetes mellitus with foot ulcer 12/27/2022 No Yes Inactive Problems Resolved Problems Electronic Signature(s) Signed: 02/07/2023 12:26:40 PM By: Kalman Shan DO Entered By: Kalman Shan on 02/07/2023 11:39:46 -------------------------------------------------------------------------------- Progress Note Details Patient Name: Date of Service: [name] 02/07/2023 10:15 A M Medical Record Number:  [id number] Patient Account Number: [id number] Date of Birth/Sex: Treating RN: 1996-09-19 (27 y.o. M) Primary Care Provider: Mina Marble Other Clinician: Referring Provider: Treating Provider/Extender: Olivia Mackie Weeks in Treatment: 6 Subjective Chief Complaint Information obtained from Patient 12/27/2022; bilateral diabetic foot wounds History of Present Illness (HPI) 12/27/2022 Mr. 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 He has been following with podiatry for this issue. He reports using Silvadene to the areas. Currently not using anything for offloading. He states that the right foot wound  has been present for the past 1-1/2 years. He states that the left foot wound appeared about 3 months ago. He has not had recent imaging of the areas. Currently denies systemic signs of infection. 01/03/2023; patient presents for follow-up. He has been using Medihoney and silver alginate to the wound beds. He has been using his surgical shoes with peg assist. He has not been scheduled for an MRI yet. There is been improvement in wound healing. He currently denies signs of infection. 1/18; patient presents for follow-up. He is scheduled to have his MRI on 1/29. He has been using silver alginate with Medihoney to the wound beds. He has been using his surgical shoes with peg assist. He has no issues or complaints today. He denies signs of infection. 1/25; patient presents for follow-up. He has been using silver alginate with Medihoney to the wound beds. He is starting to develop a new wound to the right lateral foot adjacent to the original right foot wound. He has been using his surgical shoes with peg assist. He currently denies signs of infection. Scheduled for MRI in 4 days. 2/1; patient presents for follow-up. Has been using silver alginate with Medihoney to the wound beds. Unfortunately his insurance denied his MRI a day prior to when it was scheduled. This was reapproved but only to the right foot with a prior Auth. He currently denies signs of infection. 2/8; patient presents for follow-up. He has been using silver alginate and Medihoney to the wound beds. He has no issues or complaints today. He obtained his MRI of the right foot and x-ray of the right foot yesterday. Read at time patient was seen had not resulted. 2/13; patient presents for follow-up. His right foot MRI showed no evidence of osteomyelitis. Plan is for the total contact cast and patient was agreeable to this. He has been using silver alginate and Medihoney to the wound beds. He denies signs of infection. 2/15; patient presents  for obligatory cast change. He tolerated he tolerated the total contact cast well. He has no issues or complaints today. He continues to use silver alginate and Medihoney to the left foot wound. Patient History Information obtained from Patient, Chart. Family History Unknown History. Social History Never smoker, Marital Status - Single, Alcohol Use - Never, Drug Use - No History, Caffeine Use - Rarely. AKIHIRO, COGBURN ([id number]) [id number] Page 6 of 9 Medical History Cardiovascular Patient has history of Hypertension Endocrine Patient has history of Type I Diabetes Denies history of Type II Diabetes Neurologic Patient has history of Neuropathy Hospitalization/Surgery History - R foot wound debridement and closure. 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 Medical A Surgical History Notes nd Cardiovascular heart murmur Objective Constitutional respirations regular, non-labored and within target range for patient.. Vitals Time Taken: 10:09 AM, Height: 76 in,  Weight: 189 lbs, BMI: 23, Temperature: 98.4 F, Pulse: 98 bpm, Respiratory Rate: 20 breaths/min, Blood Pressure: 105/71 mmHg, Capillary Blood Glucose: 127 mg/dl. Cardiovascular 2+ dorsalis pedis/posterior tibialis pulses. Psychiatric pleasant and cooperative. General Notes: Bilateral fifth met head wounds to the feet. Right foot wound with granulation tissue. T the left foot wound Granulation tissue With nonviable o tissue and callus. No signs of infection including increased warmth, erythema or purulent drainage. Integumentary (Hair, Skin) Wound #1 status is Open. Original cause of wound was Gradually Appeared. The date acquired was: 12/25/2019. The wound has been in treatment 6 weeks. The wound is located on the Right Metatarsal head fifth. The wound measures 0.5cm length x 1cm width x 0.2cm depth; 0.393cm^2 area and 0.079cm^3 volume. There is Fat Layer (Subcutaneous Tissue) exposed. There is a medium amount of  serosanguineous drainage noted. The wound margin is distinct with the outline attached to the wound base. There is large (67-100%) red granulation within the wound bed. There is a small (1-33%) amount of necrotic tissue within the wound bed including Adherent Slough. The periwound skin appearance exhibited: Callus. The periwound skin appearance did not exhibit: Crepitus, Excoriation, Induration, Rash, Scarring, Dry/Scaly, Maceration, Atrophie Blanche, Cyanosis, Ecchymosis, Hemosiderin Staining, Mottled, Pallor, Rubor, Erythema. Wound #2 status is Open. Original cause of wound was Gradually Appeared. The date acquired was: 09/24/2022. The wound has been in treatment 6 weeks. The wound is located on the Left Metatarsal head fifth. The wound measures 0.3cm length x 0.5cm width x 0.2cm depth; 0.118cm^2 area and 0.024cm^3 volume. There is Fat Layer (Subcutaneous Tissue) exposed. There is no tunneling or undermining noted. There is a medium amount of serosanguineous drainage noted. The wound margin is distinct with the outline attached to the wound base. There is large (67-100%) red granulation within the wound bed. There is a small (1-33%) amount of necrotic tissue within the wound bed including Adherent Slough. The periwound skin appearance exhibited: Callus, Dry/Scaly. The periwound skin appearance did not exhibit: Crepitus, Excoriation, Induration, Rash, Scarring, Maceration, Atrophie Blanche, Cyanosis, Ecchymosis, Hemosiderin Staining, Mottled, Pallor, Rubor, Erythema. 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 I recommended continuing Hydrofera Blue and antibiotic ointment under the total  contact cast to the right lower extremity. I debrided nonviable tissue to the left foot. He can continue Medihoney and silver alginate here and aggressive offloading with surgical shoe and peg assist. Follow-up in 1 week. Procedures Wound #2 Pre-procedure diagnosis of Wound #2 is a Diabetic Wound/Ulcer of the Lower Extremity located on the Left Metatarsal head fifth .Severity of Tissue Pre Debridement is: Fat layer exposed. There was a Excisional Skin/Subcutaneous Tissue Debridement with a total area of 0.15 sq cm performed by KELON, CONKIN ([id number]) [id number] Page 7 of [place], DO. With the following instrument(s): Curette to remove Viable tissue/material. Material removed includes Callus, Subcutaneous Tissue, and Slough after achieving pain control using Lidocaine. No specimens were taken. A time out was conducted at 11:03, prior to the start of the procedure. A Minimum amount of bleeding was controlled with Pressure. The procedure was tolerated well with a pain level of 0 throughout and a pain level of 0 following the procedure. Post Debridement Measurements: 0.3cm length x 0.5cm width x 0.2cm depth;  0.024cm^3 volume. Character of Wound/Ulcer Post Debridement is improved. Severity of Tissue Post Debridement is: Fat layer exposed. Post procedure Diagnosis Wound #2: Same as Pre-Procedure Wound #1 Pre-procedure diagnosis of Wound #1 is a Diabetic Wound/Ulcer of the Lower Extremity located on the Right Metatarsal head fifth . There was a T Interior and spatial designer Procedure by Kalman Shan, DO. Post procedure Diagnosis Wound #1: Same as Pre-Procedure Plan Follow-up Appointments: Return Appointment in 1 week. - Thursday with Dr. Heber Narcissa 1100 02/14/2023 Room 8 extra time TCC Anesthetic: (In clinic) Topical Lidocaine 4% applied to wound bed Bathing/ Shower/ Hygiene: May shower with protection but do not get wound dressing(s) wet. Protect dressing(s)  with water repellant cover (for example, large plastic bag) or a cast cover and may then take shower. Off-Loading: T Contact Cast to Right Lower Extremity - wear black boot with cast at all times otal Open toe surgical shoe to: - surgical shoes with PEG ASSIST to left shoes. Wear while walking and standing. DO not walk barefoot or just in socks. Additional Orders / Instructions: Follow Nutritious Diet - CLOSELY MONITOR BLOOD SUGAR. WOUND #1: - Metatarsal head fifth Wound Laterality: Right Cleanser: Wound Cleanser (Generic) 1 x Per Day/30 Days Discharge Instructions: Cleanse the wound with wound cleanser prior to applying a clean dressing using gauze sponges, not tissue or cotton balls. Peri-Wound Care: Skin Prep (Generic) 1 x Per Day/30 Days Discharge Instructions: Use skin prep as directed Topical: Gentamicin 1 x Per Day/30 Days Discharge Instructions: As directed by physician Topical: Mupirocin Ointment 1 x Per Day/30 Days Discharge Instructions: Apply Mupirocin (Bactroban) as instructed Prim Dressing: Hydrofera Blue Ready Transfer Foam, 2.5x2.5 (in/in) 1 x Per Day/30 Days ary Discharge Instructions: Apply directly to wound bed as directed Secondary Dressing: Optifoam Non-Adhesive Dressing, 4x4 in (Generic) 1 x Per Day/30 Days Discharge Instructions: Apply ONE LAYER OF FOAM D Secondary Dressing: Woven Gauze Sponges 2x2 in (Generic) 1 x Per Day/30 Days Discharge Instructions: Apply over primary dressing as directed. Secured With: Child psychotherapist, Sterile 2x75 (in/in) (Generic) 1 x Per Day/30 Days Discharge Instructions: Secure with stretch gauze as directed. Secured With: 55M Medipore H Soft Cloth Surgical T ape, 4 x 10 (in/yd) (Generic) 1 x Per Day/30 Days Discharge Instructions: Secure with tape as directed. WOUND #2: - Metatarsal head fifth Wound Laterality: Left Cleanser: Wound Cleanser (Generic) 1 x Per Day/30 Days Discharge Instructions: Cleanse the wound with  wound cleanser prior to applying a clean dressing using gauze sponges, not tissue or cotton balls. Peri-Wound Care: Skin Prep (Generic) 1 x Per Day/30 Days Discharge Instructions: Use skin prep as directed Prim Dressing: MediHoney Gel, tube 1.5 (oz) 1 x Per Day/30 Days ary Discharge Instructions: Apply to wound bed as instructed Prim Dressing: Sorbalgon AG Dressing 2x2 (in/in) (Generic) 1 x Per Day/30 Days ary Discharge Instructions: apply to wound bed. Secondary Dressing: Optifoam Non-Adhesive Dressing, 4x4 in (Generic) 1 x Per Day/30 Days Discharge Instructions: Apply ONE LAYER OF FOAM D Secondary Dressing: Woven Gauze Sponges 2x2 in (Generic) 1 x Per Day/30 Days Discharge Instructions: Apply over primary dressing as directed. Secured With: Child psychotherapist, Sterile 2x75 (in/in) (Generic) 1 x Per Day/30 Days Discharge Instructions: Secure with stretch gauze as directed. Secured With: 55M Medipore H Soft Cloth Surgical T ape, 4 x 10 (in/yd) (Generic) 1 x Per Day/30 Days Discharge Instructions: Secure with tape as directed. 1. In office sharp debridement 2. Hydrofera Blue and antibiotic ointment to the right foot 3. T  contact cast placed in standard fashion otal 4. Medihoney and silver alginate to the left foot 5. Follow-up in 1 week Electronic Signature(s) Signed: 02/07/2023 12:26:40 PM By: Kalman Shan DO Entered By: Kalman Shan on 02/07/2023 11:43:35 [name] ([id number]) [id number] Page 8 of 9 -------------------------------------------------------------------------------- HxROS Details Patient Name: Date of Service: [name], MONTIE 02/07/2023 10:15 A M Medical Record Number: [id number] Patient Account Number: [id number] Date of Birth/Sex: Treating RN: March 18, 1996 (27 y.o. M) Primary Care Provider: Mina Marble Other Clinician: Referring Provider: Treating Provider/Extender: Olivia Mackie Weeks in Treatment: 6 Information Obtained From Patient Chart Cardiovascular Medical History: Positive for: Hypertension Past Medical History Notes: heart murmur Endocrine Medical History: Positive for: Type I Diabetes Negative for: Type II Diabetes Time with diabetes: 1 year ago Treated with: Insulin, Diet Blood sugar tested every day: Yes Tested : TID Neurologic Medical History: Positive for: Neuropathy Immunizations Pneumococcal Vaccine: Received Pneumococcal Vaccination: No Implantable Devices None Hospitalization / Surgery History Type of Hospitalization/Surgery 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 Family and Social History Unknown History: Yes; Never smoker; Marital Status - Single; Alcohol Use: Never; Drug Use: No History; Caffeine Use: Rarely; Financial Concerns: No; Food, Clothing or Shelter Needs: No; Support System Lacking: No; Transportation Concerns: No Electronic Signature(s) Signed: 02/07/2023 12:26:40 PM By: Kalman Shan DO Entered By: Kalman Shan on 02/07/2023 11:41:06 -------------------------------------------------------------------------------- Total Contact Cast Details Patient Name: Date of Service: RAKEIM, WARHURST 02/07/2023 10:15 A M Medical Record Number: [id number] Patient Account Number: [id number] Date of Birth/Sex: Treating RN: 09/28/1996 (27 y.o. Hessie Diener Primary Care Provider: Mina Marble Other Clinician: Referring Provider: Treating Provider/Extender: Arsene, Deshazier ([id number]) [id number] Page 9 of 9 Weeks in Treatment: 6 T Contact Cast Applied for Wound Assessment: otal Wound #1 Right Metatarsal head fifth Performed By: Physician Kalman Shan, DO Post Procedure Diagnosis Same as Pre-procedure Electronic Signature(s) Signed: 02/07/2023 12:26:40 PM By: Kalman Shan DO Signed: 02/07/2023 4:32:13 PM By: Deon Pilling RN, BSN Entered By: Deon Pilling on 02/07/2023 10:40:22 -------------------------------------------------------------------------------- SuperBill Details Patient Name: Date of Service: [name], NICOLO 02/07/2023 Medical Record Number: [id number] Patient Account Number: [id number] Date of Birth/Sex: Treating RN: 09-14-1996 (27 y.o. 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 E10.40 Type 1 diabetes mellitus with diabetic neuropathy, unspecified E10.621 Type 1 diabetes mellitus with foot ulcer Facility Procedures : CPT4 Code: [id number] Description: [id number] - DEB SUBQ TISSUE 20 SQ CM/< ICD-10 Diagnosis Description L97.528 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 E10.621 Type 1 diabetes mellitus with foot ulcer Modifier: ty Quantity: 1 : CPT4 Code: [id number] Description: 9445 - APPLY TOTAL CONTACT LEG CAST ICD-10 Diagnosis Description L97.518 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 E10.621 Type 1 diabetes mellitus with foot ulcer Modifier: ity Quantity: 1 Physician Procedures : CPT4 Code Description Modifier [id number] - WC PHYS SUBQ TISS 20 SQ CM ICD-10 Diagnosis Description L97.528 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ty E10.621 Type 1 diabetes mellitus with foot ulcer Quantity: 1 : [id number] - WC PHYS APPLY TOTAL CONTACT CAST ICD-10 Diagnosis Description L97.518 Non-pressure chronic  ulcer of other part of right foot with other specified severity E10.621 Type 1 diabetes mellitus with foot  ulcer Quantity: 1 Electronic Signature(s) Signed: 02/07/2023 12:26:40 PM By: Kalman Shan DO Entered By: Kalman Shan on 02/07/2023 11:44:26

## 2023-02-09 NOTE — Progress Notes (Signed)
QUAID, GOCHNAUER ([id number]) [id number] Page 1 of 7 Visit Report for 02/07/2023 Arrival Information Details Patient Name: Date of Service: [name], [name] 02/07/2023 10:15 A M Medical Record Number: [id number] Patient Account Number: [id number] Date of Birth/Sex: Treating RN: 1996/01/01 (27 y.o. M) Primary Care Austine Kelsay: Mina Marble Other Clinician: Referring Mulan Adan: Treating Beth Spackman/Extender: Olivia Mackie Weeks in Treatment: 6 Visit Information History Since Last Visit All ordered tests and consults were completed: No Patient Arrived: Ambulatory Added or deleted any medications: No Arrival Time: 10:08 Any new allergies or adverse reactions: No Accompanied By: mother Had a fall or experienced change in No Transfer Assistance: None activities of daily living that may affect Patient Identification Verified: Yes risk of falls: Secondary Verification Process Completed: Yes Signs or symptoms of abuse/neglect since last visito No Patient Requires Transmission-Based Precautions: Yes Hospitalized since last visit: No Transmission-Based Precautions: Contact MRSA Implantable device outside of the clinic excluding No Patient Has Alerts: No cellular tissue based products placed in the center since last visit: Pain Present Now: No Electronic Signature(s) Signed: 02/07/2023 10:51:25 AM By: Worthy Rancher Entered By: Worthy Rancher on 02/07/2023 10:09:12 -------------------------------------------------------------------------------- Lower Extremity Assessment Details Patient Name: Date of Service: [name], [name] 02/07/2023 10:15 A M Medical Record Number: [id number] Patient Account Number: [id number] Date of Birth/Sex: Treating RN: Jun 06, 1996 (27 y.o. Burnadette Pop, Lauren Primary Care Matasha Smigelski: Mina Marble Other Clinician: Referring Hearl Heikes: Treating Arlo Butt/Extender: Olivia Mackie Weeks in Treatment:  6 Edema Assessment Assessed: [Left: No] [Right: No] Edema: [Left: No] [Right: No] Calf Left: Right: Point of Measurement: 34 cm From Medial Instep 37.5 cm 37 cm Ankle Left: Right: Point of Measurement: 9 cm From Medial Instep 21.8 cm 22 cm Electronic Signature(s) Signed: 02/08/2023 12:08:46 PM By: Rhae Hammock RN Entered By: Rhae Hammock on 02/07/2023 11:02:56 [name] ([id number]) [id number] Page 2 of 7 -------------------------------------------------------------------------------- Multi Wound Chart Details Patient Name: Date of Service: [name], [name] 02/07/2023 10:15 A M Medical Record Number: [id number] Patient Account Number: [id number] Date of Birth/Sex: Treating RN: [birth year] (27 y.o. M) Primary Care Yarelin Reichardt: Mina Marble Other Clinician: Referring Carlin Mamone: Treating Montre Harbor/Extender: Olivia Mackie Weeks in Treatment: 6 Vital Signs Height(in): 76 Capillary Blood Glucose(mg/dl): 127 Weight(lbs): 189 Pulse(bpm): 50 Body Mass Index(BMI): 23 Blood Pressure(mmHg): 105/71 Temperature(F): 98.4 Respiratory Rate(breaths/min): 20 [1:Photos:] [N/A:N/A] Right Metatarsal head fifth Left Metatarsal head fifth N/A Wound Location: Gradually Appeared Gradually Appeared N/A Wounding Event: Diabetic Wound/Ulcer of the Lower Diabetic Wound/Ulcer of the Lower N/A Primary Etiology: Extremity Extremity Hypertension, Type I Diabetes, Hypertension, Type I Diabetes, N/A Comorbid History: Neuropathy Neuropathy 12/25/2019 09/24/2022 N/A Date Acquired: 6 6 N/A Weeks of Treatment: Open Open N/A Wound Status: No No N/A Wound Recurrence: Yes Yes N/A Pending A mputation on Presentation: 0.5x1x0.2 0.3x0.5x0.2 N/A Measurements L x W x D (cm) 0.393 0.118 N/A A (cm) : rea 0.079 0.024 N/A Volume (cm) : -453.50% -151.10% N/A % Reduction in A rea: -464.30% -71.40% N/A % Reduction in Volume: Grade 2 Grade 2  N/A Classification: Medium Medium N/A Exudate A mount: Serosanguineous Serosanguineous N/A Exudate Type: red, brown red, brown N/A Exudate Color: Distinct, outline attached Distinct, outline attached N/A Wound Margin: Large (67-100%) Large (67-100%) N/A Granulation A mount: Red Red N/A Granulation Quality: Small (1-33%) Small (1-33%) N/A Necrotic A mount: Fat Layer (Subcutaneous Tissue): Yes Fat Layer (Subcutaneous Tissue): Yes N/A Exposed Structures: Fascia: No Fascia: No Tendon: No Tendon: No Muscle: No Muscle: No Joint: No Joint: No Bone: No Bone: No  None None N/A Epithelialization: N/A Debridement - Excisional N/A Debridement: Pre-procedure Verification/Time Out N/A 11:03 N/A Taken: N/A Lidocaine N/A Pain Control: N/A Callus, Subcutaneous, Slough N/A Tissue Debrided: N/A Skin/Subcutaneous Tissue N/A Level: N/A 0.15 N/A Debridement A (sq cm): rea N/A Curette N/A Instrument: N/A Minimum N/A Bleeding: N/A Pressure N/A Hemostasis A chieved: N/A 0 N/A Procedural Pain: N/A 0 N/A Post Procedural Pain: N/A Procedure was tolerated well N/A Debridement Treatment Response: N/A 0.3x0.5x0.2 N/A Post Debridement Measurements L x W x D (cm) [name], [name] ([id number]) [id number] Page 3 of 7 N/A 0.024 N/A Post Debridement Volume: (cm) Callus: Yes Callus: Yes N/A Periwound Skin Texture: Excoriation: No Excoriation: No Induration: No Induration: No Crepitus: No Crepitus: No Rash: No Rash: No Scarring: No Scarring: No Maceration: No Dry/Scaly: Yes N/A Periwound Skin Moisture: Dry/Scaly: No Maceration: No Atrophie Blanche: No Atrophie Blanche: No N/A Periwound Skin Color: Cyanosis: No Cyanosis: No Ecchymosis: No Ecchymosis: No Erythema: No Erythema: No Hemosiderin Staining: No Hemosiderin Staining: No Mottled: No Mottled: No Pallor: No Pallor: No Rubor: No Rubor: No T Contact Cast otal Debridement  N/A Procedures Performed: Treatment Notes Electronic Signature(s) Signed: 02/07/2023 12:26:40 PM By: Kalman Shan DO Entered By: Kalman Shan on 02/07/2023 11:39:52 -------------------------------------------------------------------------------- Multi-Disciplinary Care Plan Details Patient Name: Date of Service: [name] 02/07/2023 10:15 A M Medical Record Number: [id number] Patient Account Number: [id number] Date of Birth/Sex: Treating RN: 01-27-1996 (27 y.o. Hessie Diener Primary Care Marquis Down: Mina Marble Other Clinician: Referring Sergio Hobart: Treating Chelcey Caputo/Extender: Olivia Mackie Weeks in Treatment: 6 Active Inactive Pain, Acute or Chronic Nursing Diagnoses: Pain, acute or chronic: actual or potential Potential alteration in comfort, pain Goals: Patient will verbalize adequate pain control and receive pain control interventions during procedures as needed Date Initiated: 12/27/2022 Target Resolution Date: 02/22/2023 Goal Status: Active Patient/caregiver will verbalize comfort level met Date Initiated: 12/27/2022 Target Resolution Date: 02/22/2023 Goal Status: Active Interventions: Complete pain assessment as per visit requirements Encourage patient to take pain medications as prescribed Provide education on pain management Treatment Activities: Administer pain control measures as ordered : 12/27/2022 Notes: Wound/Skin Impairment Nursing Diagnoses: Knowledge deficit related to ulceration/compromised skin integrity Goals: Patient/caregiver will verbalize understanding of skin care regimen Date Initiated: 12/27/2022 Target Resolution Date: 02/22/2023 DMARION, VIVIER ([id number]) [phone].pdf Page 4 of 7 Goal Status: Active Interventions: Assess patient/caregiver ability to perform ulcer/skin care regimen upon admission and as needed Assess ulceration(s) every visit Provide education on ulcer and skin  care Treatment Activities: Skin care regimen initiated : 12/27/2022 Topical wound management initiated : 12/27/2022 Notes: Electronic Signature(s) Signed: 02/07/2023 4:32:13 PM By: Deon Pilling RN, BSN Entered By: Deon Pilling on 02/07/2023 10:36:07 -------------------------------------------------------------------------------- Pain Assessment Details Patient Name: Date of Service: [name], [name] 02/07/2023 10:15 A M Medical Record Number: [id number] Patient Account Number: [id number] Date of Birth/Sex: Treating RN: 1996-04-07 (27 y.o. M) Primary Care Kyriaki Moder: Mina Marble Other Clinician: Referring Angela Vazguez: Treating Niall Illes/Extender: Olivia Mackie Weeks in Treatment: 6 Active Problems Location of Pain Severity and Description of Pain Patient Has Paino No Site Locations Pain Management and Medication Current Pain Management: Electronic Signature(s) Signed: 02/07/2023 10:51:25 AM By: Worthy Rancher Entered By: Worthy Rancher on 02/07/2023 10:10:01 Patient/Caregiver Education Details -------------------------------------------------------------------------------- [name] ([id number]) [id number] Page 5 of 7 Patient Name: Date of Service: [name], Lucas E. 2/15/2024andnbsp10:15 A M Medical Record Number: [id number] Patient Account Number: [id number] Date of Birth/Gender: Treating RN: 04/06/1996 (27 y.o. Hessie Diener Primary Care Physician: Mina Marble Other Clinician:  Referring Physician: Treating Physician/Extender: Burns Spain in Treatment: 6 Education Assessment Education Provided To: Patient Education Topics Provided Wound/Skin Impairment: Handouts: Caring for Your Ulcer Methods: Explain/Verbal Responses: Reinforcements needed Electronic Signature(s) Signed: 02/07/2023 4:32:13 PM By: Deon Pilling RN, BSN Entered By: Deon Pilling on 02/07/2023  10:36:31 -------------------------------------------------------------------------------- Wound Assessment Details Patient Name: Date of Service: [name], [name] 02/07/2023 10:15 A M Medical Record Number: [id number] Patient Account Number: [id number] Date of Birth/Sex: Treating RN: 02-14-1996 (27 y.o. M) Primary Care Penny Frisbie: Mina Marble Other Clinician: Referring Carnel Stegman: Treating Asheley Hellberg/Extender: Olivia Mackie Weeks in Treatment: 6 Wound Status Wound Number: 1 Primary Etiology: Diabetic Wound/Ulcer of the Lower Extremity Wound Location: Right Metatarsal head fifth Wound Status: Open Wounding Event: Gradually Appeared Comorbid History: Hypertension, Type I Diabetes, Neuropathy Date Acquired: 12/25/2019 Weeks Of Treatment: 6 Clustered Wound: No Pending Amputation On Presentation Photos Wound Measurements Length: (cm) 0.5 Width: (cm) 1 Depth: (cm) 0.2 Area: (cm) 0.393 Volume: (cm) 0.079 % Reduction in Area: -453.5% % Reduction in Volume: -464.3% Epithelialization: None Wound Description Classification: Grade 2 [name], [name] ([id number]) Wound Margin: Distinct, outline attached Exudate Amount: Medium Exudate Type: Serosanguineous Exudate Color: red, brown Foul Odor After Cleansing: No [id number] Page 6 of 7 Slough/Fibrino Yes Wound Bed Granulation Amount: Large (67-100%) Exposed Structure Granulation Quality: Red Fascia Exposed: No Necrotic Amount: Small (1-33%) Fat Layer (Subcutaneous Tissue) Exposed: Yes Necrotic Quality: Adherent Slough Tendon Exposed: No Muscle Exposed: No Joint Exposed: No Bone Exposed: No Periwound Skin Texture Texture Color No Abnormalities Noted: No No Abnormalities Noted: No Callus: Yes Atrophie Blanche: No Crepitus: No Cyanosis: No Excoriation: No Ecchymosis: No Induration: No Erythema: No Rash: No Hemosiderin Staining: No Scarring: No Mottled: No Pallor:  No Moisture Rubor: No No Abnormalities Noted: No Dry / Scaly: No Maceration: No Electronic Signature(s) Signed: 02/07/2023 4:32:13 PM By: Deon Pilling RN, BSN Entered By: Deon Pilling on 02/07/2023 10:35:37 -------------------------------------------------------------------------------- Wound Assessment Details Patient Name: Date of Service: [name], [name] 02/07/2023 10:15 A M Medical Record Number: [id number] Patient Account Number: [id number] Date of Birth/Sex: Treating RN: 18-Jun-1996 (27 y.o. M) Primary Care Ancel Easler: Mina Marble Other Clinician: Referring Ilai Hiller: Treating Zorawar Strollo/Extender: Olivia Mackie Weeks in Treatment: 6 Wound Status Wound Number: 2 Primary Etiology: Diabetic Wound/Ulcer of the Lower Extremity Wound Location: Left Metatarsal head fifth Wound Status: Open Wounding Event: Gradually Appeared Comorbid History: Hypertension, Type I Diabetes, Neuropathy Date Acquired: 09/24/2022 Weeks Of Treatment: 6 Clustered Wound: No Pending Amputation On Presentation Photos Wound Measurements [name], [name] ([id number]) Length: (cm) 0. Width: (cm) 0. Depth: (cm) 0. Area: (cm) 0 Volume: (cm) 0 [id number] Page 7 of 7 3 % Reduction in Area: -151.1% 5 % Reduction in Volume: -71.4% 2 Epithelialization: None .118 Tunneling: No .024 Undermining: No Wound Description Classification: Grade 2 Wound Margin: Distinct, outline attached Exudate Amount: Medium Exudate Type: Serosanguineous Exudate Color: red, brown Foul Odor After Cleansing: No Slough/Fibrino Yes Wound Bed Granulation Amount: Large (67-100%) Exposed Structure Granulation Quality: Red Fascia Exposed: No Necrotic Amount: Small (1-33%) Fat Layer (Subcutaneous Tissue) Exposed: Yes Necrotic Quality: Adherent Slough Tendon Exposed: No Muscle Exposed: No Joint Exposed: No Bone Exposed: No Periwound Skin Texture Texture Color No  Abnormalities Noted: No No Abnormalities Noted: No Callus: Yes Atrophie Blanche: No Crepitus: No Cyanosis: No Excoriation: No Ecchymosis: No Induration: No Erythema: No Rash: No Hemosiderin Staining: No Scarring: No Mottled: No Pallor: No Moisture Rubor: No No Abnormalities Noted: No Dry / Scaly: Yes Maceration: No Electronic  Signature(s) Signed: 02/07/2023 4:32:13 PM By: Deon Pilling RN, BSN Entered By: Deon Pilling on 02/07/2023 10:35:51 -------------------------------------------------------------------------------- Vitals Details Patient Name: Date of Service: [name] 02/07/2023 10:15 A M Medical Record Number: [id number] Patient Account Number: [id number] Date of Birth/Sex: Treating RN: [birth year] (27 y.o. M) Primary Care Laurinda Carreno: Mina Marble Other Clinician: Referring Evans Levee: Treating Veida Spira/Extender: Olivia Mackie Weeks in Treatment: 6 Vital Signs Time Taken: 10:09 Temperature (F): 98.4 Height (in): 76 Pulse (bpm): 98 Weight (lbs): 189 Respiratory Rate (breaths/min): 20 Body Mass Index (BMI): 23 Blood Pressure (mmHg): 105/71 Capillary Blood Glucose (mg/dl): 127 Reference Range: 80 - 120 mg / dl Electronic Signature(s) Signed: 02/07/2023 10:51:25 AM By: Worthy Rancher Entered By: Worthy Rancher on 02/07/2023 10:09:55

## 2023-02-13 ENCOUNTER — Ambulatory Visit: Payer: BC Managed Care – PPO | Admitting: Nurse Practitioner

## 2023-02-13 ENCOUNTER — Inpatient Hospital Stay (HOSPITAL_COMMUNITY)
Admission: EM | Admit: 2023-02-13 | Discharge: 2023-02-19 | DRG: 623 | Disposition: A | Discharge status: Home Health | Payer: BC Managed Care – PPO | Attending: Internal Medicine | Admitting: Internal Medicine

## 2023-02-13 ENCOUNTER — Encounter (HOSPITAL_BASED_OUTPATIENT_CLINIC_OR_DEPARTMENT_OTHER): Payer: BC Managed Care – PPO | Admitting: Physician Assistant

## 2023-02-13 ENCOUNTER — Emergency Department (HOSPITAL_COMMUNITY): Payer: BC Managed Care – PPO

## 2023-02-13 DIAGNOSIS — E1165 Type 2 diabetes mellitus with hyperglycemia: Secondary | ICD-10-CM | POA: Diagnosis present

## 2023-02-13 DIAGNOSIS — L97519 Non-pressure chronic ulcer of other part of right foot with unspecified severity: Secondary | ICD-10-CM | POA: Diagnosis not present

## 2023-02-13 DIAGNOSIS — Z79899 Other long term (current) drug therapy: Secondary | ICD-10-CM

## 2023-02-13 DIAGNOSIS — M7989 Other specified soft tissue disorders: Secondary | ICD-10-CM | POA: Diagnosis not present

## 2023-02-13 DIAGNOSIS — F320 Major depressive disorder, single episode, mild: Secondary | ICD-10-CM

## 2023-02-13 DIAGNOSIS — Z8249 Family history of ischemic heart disease and other diseases of the circulatory system: Secondary | ICD-10-CM | POA: Diagnosis not present

## 2023-02-13 DIAGNOSIS — F419 Anxiety disorder, unspecified: Secondary | ICD-10-CM | POA: Diagnosis not present

## 2023-02-13 DIAGNOSIS — M869 Osteomyelitis, unspecified: Secondary | ICD-10-CM

## 2023-02-13 DIAGNOSIS — L97512 Non-pressure chronic ulcer of other part of right foot with fat layer exposed: Secondary | ICD-10-CM | POA: Diagnosis not present

## 2023-02-13 DIAGNOSIS — Z6824 Body mass index (BMI) 24.0-24.9, adult: Secondary | ICD-10-CM | POA: Diagnosis not present

## 2023-02-13 DIAGNOSIS — E1169 Type 2 diabetes mellitus with other specified complication: Secondary | ICD-10-CM | POA: Diagnosis not present

## 2023-02-13 DIAGNOSIS — M86172 Other acute osteomyelitis, left ankle and foot: Secondary | ICD-10-CM | POA: Diagnosis not present

## 2023-02-13 DIAGNOSIS — E782 Mixed hyperlipidemia: Secondary | ICD-10-CM | POA: Diagnosis present

## 2023-02-13 DIAGNOSIS — Z833 Family history of diabetes mellitus: Secondary | ICD-10-CM | POA: Diagnosis not present

## 2023-02-13 DIAGNOSIS — E440 Moderate protein-calorie malnutrition: Secondary | ICD-10-CM | POA: Diagnosis not present

## 2023-02-13 DIAGNOSIS — Z8614 Personal history of Methicillin resistant Staphylococcus aureus infection: Secondary | ICD-10-CM

## 2023-02-13 DIAGNOSIS — L02611 Cutaneous abscess of right foot: Secondary | ICD-10-CM | POA: Diagnosis present

## 2023-02-13 DIAGNOSIS — E0865 Diabetes mellitus due to underlying condition with hyperglycemia: Secondary | ICD-10-CM | POA: Diagnosis not present

## 2023-02-13 DIAGNOSIS — E10621 Type 1 diabetes mellitus with foot ulcer: Secondary | ICD-10-CM | POA: Diagnosis not present

## 2023-02-13 DIAGNOSIS — E13621 Other specified diabetes mellitus with foot ulcer: Secondary | ICD-10-CM | POA: Diagnosis not present

## 2023-02-13 DIAGNOSIS — I10 Essential (primary) hypertension: Secondary | ICD-10-CM | POA: Diagnosis present

## 2023-02-13 DIAGNOSIS — E13628 Other specified diabetes mellitus with other skin complications: Principal | ICD-10-CM | POA: Diagnosis present

## 2023-02-13 DIAGNOSIS — Z794 Long term (current) use of insulin: Secondary | ICD-10-CM

## 2023-02-13 DIAGNOSIS — I2510 Atherosclerotic heart disease of native coronary artery without angina pectoris: Secondary | ICD-10-CM | POA: Diagnosis present

## 2023-02-13 DIAGNOSIS — M86171 Other acute osteomyelitis, right ankle and foot: Principal | ICD-10-CM

## 2023-02-13 DIAGNOSIS — F418 Other specified anxiety disorders: Secondary | ICD-10-CM | POA: Diagnosis not present

## 2023-02-13 DIAGNOSIS — S98211A Complete traumatic amputation of two or more right lesser toes, initial encounter: Secondary | ICD-10-CM | POA: Diagnosis not present

## 2023-02-13 DIAGNOSIS — F32A Depression, unspecified: Secondary | ICD-10-CM | POA: Diagnosis not present

## 2023-02-13 LAB — CBC
HCT: 48.1 % (ref 39.0–52.0)
Hemoglobin: 15.7 g/dL (ref 13.0–17.0)
MCH: 28.9 pg (ref 26.0–34.0)
MCHC: 32.6 g/dL (ref 30.0–36.0)
MCV: 88.6 fL (ref 80.0–100.0)
Platelets: 370 10*3/uL (ref 150–400)
RBC: 5.43 MIL/uL (ref 4.22–5.81)
RDW: 12.6 % (ref 11.5–15.5)
WBC: 14.8 10*3/uL — ABNORMAL HIGH (ref 4.0–10.5)
nRBC: 0 % (ref 0.0–0.2)

## 2023-02-13 LAB — BASIC METABOLIC PANEL
Anion gap: 13 (ref 5–15)
BUN: 17 mg/dL (ref 6–20)
CO2: 25 mmol/L (ref 22–32)
Calcium: 10 mg/dL (ref 8.9–10.3)
Chloride: 99 mmol/L (ref 98–111)
Creatinine, Ser: 0.82 mg/dL (ref 0.61–1.24)
GFR, Estimated: >60 mL/min (ref >60)
Glucose, Bld: 92 mg/dL (ref 70–99)
Potassium: 4.5 mmol/L (ref 3.5–5.1)
Sodium: 137 mmol/L (ref 135–145)

## 2023-02-13 LAB — GLUCOSE, CAPILLARY: Glucose-Capillary: 80 mg/dL (ref 70–99)

## 2023-02-13 LAB — LACTIC ACID, PLASMA: Lactic Acid, Venous: 1 mmol/L (ref 0.5–1.9)

## 2023-02-13 MED ORDER — INSULIN GLARGINE-YFGN 100 UNIT/ML ~~LOC~~ SOLN
15.0000 [IU] | Freq: Two times a day (BID) | SUBCUTANEOUS | Status: DC
  Administered 2023-02-13: 15 [IU] via SUBCUTANEOUS
  Filled 2023-02-13 (×3): qty 0.15

## 2023-02-13 MED ORDER — METOPROLOL SUCCINATE ER 50 MG PO TB24
100.0000 mg | ORAL_TABLET | Freq: Every day | ORAL | Status: DC
  Administered 2023-02-13 – 2023-02-18 (×6): 100 mg via ORAL
  Filled 2023-02-13: qty 2
  Filled 2023-02-13: qty 4
  Filled 2023-02-13: qty 2
  Filled 2023-02-13: qty 4
  Filled 2023-02-13 (×2): qty 2

## 2023-02-13 MED ORDER — ACETAMINOPHEN 325 MG PO TABS: 650.0000 mg | ORAL_TABLET | Freq: Four times a day (QID) | ORAL | Status: DC | PRN

## 2023-02-13 MED ORDER — VANCOMYCIN HCL 1250 MG/250ML IV SOLN
1250.0000 mg | Freq: Two times a day (BID) | INTRAVENOUS | Status: DC
  Administered 2023-02-13 – 2023-02-18 (×11): 1250 mg via INTRAVENOUS
  Filled 2023-02-13 (×11): qty 250

## 2023-02-13 MED ORDER — SENNOSIDES-DOCUSATE SODIUM 8.6-50 MG PO TABS: 1.0000 | ORAL_TABLET | Freq: Every evening | ORAL | Status: DC | PRN

## 2023-02-13 MED ORDER — SODIUM CHLORIDE 0.9 % IV SOLN
2.0000 g | INTRAVENOUS | Status: DC
  Administered 2023-02-13 – 2023-02-17 (×5): 2 g via INTRAVENOUS
  Filled 2023-02-13 (×6): qty 20

## 2023-02-13 MED ORDER — HYDROXYZINE HCL 25 MG PO TABS: 50.0000 mg | ORAL_TABLET | Freq: Every day | ORAL | Status: DC | PRN

## 2023-02-13 MED ORDER — HYDROCODONE-ACETAMINOPHEN 5-325 MG PO TABS
1.0000 | ORAL_TABLET | ORAL | Status: DC | PRN
  Administered 2023-02-15: 1 via ORAL
  Filled 2023-02-13: qty 1

## 2023-02-13 MED ORDER — ENOXAPARIN SODIUM 40 MG/0.4ML IJ SOSY
40.0000 mg | PREFILLED_SYRINGE | Freq: Every day | INTRAMUSCULAR | Status: DC
  Administered 2023-02-13 – 2023-02-18 (×6): 40 mg via SUBCUTANEOUS
  Filled 2023-02-13 (×6): qty 0.4

## 2023-02-13 MED ORDER — DULOXETINE HCL 30 MG PO CPEP
30.0000 mg | ORAL_CAPSULE | Freq: Every day | ORAL | Status: DC
  Administered 2023-02-14 – 2023-02-19 (×6): 30 mg via ORAL
  Filled 2023-02-13 (×6): qty 1

## 2023-02-13 MED ORDER — ACETAMINOPHEN 650 MG RE SUPP: 650.0000 mg | Freq: Four times a day (QID) | RECTAL | Status: DC | PRN

## 2023-02-13 MED ORDER — INSULIN GLARGINE-YFGN 100 UNIT/ML ~~LOC~~ SOLN
20.0000 [IU] | Freq: Two times a day (BID) | SUBCUTANEOUS | Status: DC
  Filled 2023-02-13: qty 0.2

## 2023-02-13 MED ORDER — INSULIN ASPART 100 UNIT/ML IJ SOLN
0.0000 [IU] | Freq: Three times a day (TID) | INTRAMUSCULAR | Status: DC
  Administered 2023-02-14: 2 [IU] via SUBCUTANEOUS
  Administered 2023-02-16: 3 [IU] via SUBCUTANEOUS
  Administered 2023-02-17: 2 [IU] via SUBCUTANEOUS
  Administered 2023-02-18 – 2023-02-19 (×2): 3 [IU] via SUBCUTANEOUS

## 2023-02-13 MED ORDER — INSULIN ASPART 100 UNIT/ML IJ SOLN: 0.0000 [IU] | Freq: Every day | INTRAMUSCULAR | Status: DC

## 2023-02-13 MED ORDER — BUSPIRONE HCL 5 MG PO TABS
5.0000 mg | ORAL_TABLET | Freq: Two times a day (BID) | ORAL | Status: DC
  Administered 2023-02-13 – 2023-02-19 (×12): 5 mg via ORAL
  Filled 2023-02-13 (×12): qty 1

## 2023-02-13 MED ORDER — METRONIDAZOLE 500 MG/100ML IV SOLN
500.0000 mg | Freq: Two times a day (BID) | INTRAVENOUS | Status: DC
  Administered 2023-02-13 – 2023-02-18 (×10): 500 mg via INTRAVENOUS
  Filled 2023-02-13 (×10): qty 100

## 2023-02-13 MED ORDER — MORPHINE SULFATE (PF) 2 MG/ML IV SOLN: 1.0000 mg | INTRAVENOUS | Status: DC | PRN

## 2023-02-13 MED ORDER — ATORVASTATIN CALCIUM 10 MG PO TABS
10.0000 mg | ORAL_TABLET | Freq: Every day | ORAL | Status: DC
  Administered 2023-02-13 – 2023-02-19 (×7): 10 mg via ORAL
  Filled 2023-02-13 (×7): qty 1

## 2023-02-13 MED ORDER — ZOLPIDEM TARTRATE 5 MG PO TABS
10.0000 mg | ORAL_TABLET | Freq: Every day | ORAL | Status: DC
  Administered 2023-02-14 – 2023-02-18 (×6): 10 mg via ORAL
  Filled 2023-02-13 (×6): qty 2

## 2023-02-13 NOTE — H&P (Signed)
PCP:   Danna Hefty, DO   Chief Complaint:  Infected right lower extremity  HPI: This is a 27 year old male with known history of diabetes mellitus type 1.5, peripheral arterial insufficiency, hypertension, hyperlipidemia.  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  Since then he has had recurrent ulcer issues and follows with wound care.  On Tuesday he had a total contact cast placed on his right lower extremity.  Two days later he developed significant discomfort at the site of his ulcer under the cast.  He thought the discomfort was because he was using the cast, so he ignored it.  Today he returns to wound care and told him his discomfort was getting worse.  The cast was removed and significant erythema, swelling, increased purulent drainage was noted.  He was sent to the ER.  He denies fevers chills, or vomiting.  He endorses nausea.  In the ER MRI RLE read new soft tissue edema, swelling, and draining sinus tracts dorsal and lateral to the distal metatarsal shaft through the proximal phalanx of the fourth digit. This includes a walled-off pocket of fluid suspicious for an abscess measuring up to 27 mm along the long axis of the foot. There is marrow edema within the 4rth metatarsal head and trace marrow edema within the proximal plantar aspect of the 4th proximal phalanx suspicious for acute osteomyelitis. White blood count elevated 14.8.  Lactic acid normal.  Admission requested.  Review of Systems:  The patient denies fever, weight loss, vision loss, decreased hearing, hoarseness, chest pain, syncope, dyspnea on exertion, peripheral edema, balance deficits, hemoptysis, abdominal pain, melena, hematochezia, severe indigestion/heartburn, hematuria, incontinence, genital sores, muscle weakness, transient blindness, depression, unusual weight change, abnormal bleeding, enlarged lymph nodes, angioedema, and breast masses. Positives: Pain, swelling, erythema, purulent drainage right  extremity, nausea, difficulty walking   Past Medical History: Past Medical History:  Diagnosis Date   Complication of anesthesia    Hard to wake up when he was younger once.   Diabetes mellitus, type II ([place])    Heart murmur    Hypertension    Past Surgical History:  Procedure Laterality Date   AMPUTATION Right 05/21/2021   Procedure: AMPUTATION RAY 5th;  Surgeon: Trula Slade, DPM;  Location: [facility];  Service: Podiatry;  Laterality: Right;   PALATE / UVULA BIOPSY / EXCISION     "growth removed"   UPPER GASTROINTESTINAL ENDOSCOPY     Done in [place] - 2013 ish   WOUND DEBRIDEMENT Right 05/24/2021   Procedure: RIGHT FOOT WOUND DEBRIDEMENT AND CLOSURE;  Surgeon: Evelina Bucy, DPM;  Location: [place];  Service: Podiatry;  Laterality: Right;    Medications: Prior to Admission medications   Medication Sig Start Date End Date Taking? Authorizing Provider  aspirin EC 81 MG tablet Take 1 tablet (81 mg total) by mouth daily. 09/13/20   Verta Ellen., NP  atorvastatin (LIPITOR) 10 MG tablet Take 1 tablet by mouth daily.    [provider]  blood glucose meter kit and supplies KIT Dispense based on patient and insurance preference. Use up to four times daily as directed. (FOR ICD-10 E11.65). 01/13/19   Cassandria Anger, MD  busPIRone (BUSPAR) 5 MG tablet Take 5 mg by mouth 2 (two) times daily. 07/26/22   [provider]  busPIRone (BUSPAR) 5 MG tablet 1 tablet Orally Twice a day for 90 days    [provider]  Dulaglutide (TRULICITY) 1.5 0000000 SOPN Inject 1.5 mg  into the skin once a week. 11/14/22   Philemon Kingdom, MD  DULoxetine (CYMBALTA) 30 MG capsule Take 30 mg by mouth daily. 07/18/22   [provider]  DULoxetine (CYMBALTA) 30 MG capsule 1 capsule Orally Once a day for 90 days    [provider]  FARXIGA 10 MG TABS tablet Take 10 mg by mouth daily. 01/25/22   [provider]  GVOKE HYPOPEN 1-PACK 1 MG/0.2ML SOAJ  Inject 1 mg under skin as needed for hypoglycemia 05/14/22   Philemon Kingdom, MD  hydrOXYzine (ATARAX) 50 MG tablet 1 tablet as needed Orally Once a day for 90 days    [provider]  ibuprofen (ADVIL) 800 MG tablet Take 800 mg by mouth 3 (three) times daily as needed. 09/10/22   [provider]  insulin glargine (LANTUS SOLOSTAR) 100 UNIT/ML Solostar Pen Inject 20 Units into the skin daily. Patient taking differently: Inject 24-26 Units into the skin 2 (two) times daily. 07/31/22   Philemon Kingdom, MD  Insulin Pen Needle (CARETOUCH PEN NEEDLES) 31G X 6 MM MISC Use 6x a day 10/18/20   Philemon Kingdom, MD  Lancet Devices [facility]) lancets Use as instructed 4 x daily. e11.65 04/03/17   Cassandria Anger, MD  Lancets (ACCU-CHEK SOFT TOUCH) lancets Use as instructed 08/10/18   Isla Pence, MD  metoprolol succinate (TOPROL-XL) 100 MG 24 hr tablet Take 1 tablet (100 mg total) by mouth at bedtime. Take with or immediately following a meal. 07/17/22 07/12/23  Hilty, Nadean Corwin, MD  nitroGLYCERIN (NITROSTAT) 0.4 MG SL tablet Place 1 tablet (0.4 mg total) under the tongue every 5 (five) minutes as needed for chest pain. 09/13/20 10/30/21  Verta Ellen., NP  nitroGLYCERIN (NITROSTAT) 0.4 MG SL tablet as directed Sublingual    [provider]  OVER THE COUNTER MEDICATION Place 4-5 drops into the left ear daily as needed (ringing in ear). Ear ringing medication    [provider]  Semaglutide, 1 MG/DOSE, 4 MG/3ML SOPN Inject 1 mg as directed once a week. 01/15/23   Philemon Kingdom, MD  SSD 1 % cream APPLY  CREAM EXTERNALLY TO AFFECTED AREA ONCE DAILY 11/23/22   Standiford, Nena Alexander, DPM  zolpidem (AMBIEN) 10 MG tablet Take 10 mg by mouth at bedtime. 08/17/20   [provider]    Allergies:   Allergies  Allergen Reactions   Amoxicillin Hives    Has patient had a PCN reaction causing immediate rash, facial/tongue/throat swelling, SOB or  lightheadedness with hypotension: No Has patient had a PCN reaction causing severe rash involving mucus membranes or skin necrosis: No Has patient had a PCN reaction that required hospitalization: No Has patient had a PCN reaction occurring within the last 10 years: No If all of the above answers are "NO", then may proceed with Cephalosporin use.    Lexapro [Escitalopram] Other (See Comments)    Social History:  reports that he has never smoked. He has been exposed to tobacco smoke. He has never used smokeless tobacco. He reports that he does not drink alcohol and does not use drugs.  Family History: Family History  Problem Relation Age of Onset   Healthy Mother    Healthy Father    Healthy Sister    Healthy Sister    Diabetes Maternal Grandmother    Thyroid disease Maternal Grandmother    Hypertension Maternal Grandmother    Neuropathy Maternal Grandmother     Physical Exam: Vitals:   02/13/23 2000  02/13/23 2030 02/13/23 2055 02/13/23 2100  BP:  135/83  (!) 147/99  Pulse:  98  100  Resp:  17  18  Temp:   98.9 F (37.2 C)   TempSrc:   Oral   SpO2:  100%  100%  Weight: 85.7 kg       General:  Alert and oriented times three, well developed and nourished, no acute distress. cachectic Eyes: PERRLA, pink conjunctiva, no scleral icterus ENT: Moist oral mucosa, neck supple, no thyromegaly Lungs: clear to ascultation, no wheeze, no crackles, no use of accessory muscles Cardiovascular: regular rate and rhythm, no regurgitation, no gallops, no murmurs. No carotid bruits, no JVD Abdomen: soft, positive BS, non-tender, non-distended, no organomegaly, not an acute abdomen GU: not examined Neuro: CN II - XII grossly intact, sensation intact Musculoskeletal: strength 5/5 all extremities. LLE: callus/open/dry on sole planter under little toe RLE: cellulitis/erythema/ abscess. Open draining erythematous under  and surrounding skamll toe amp Skin: no rash, no subcutaneous crepitation,  no decubitus Psych: appropriate patient   Labs on Admission:  Recent Labs    02/13/23 1422  NA 137  K 4.5  CL 99  CO2 25  GLUCOSE 92  BUN 17  CREATININE 0.82  CALCIUM 10.0    Radiological Exams on Admission: MR FOOT RIGHT WO CONTRAST  Result Date: 02/13/2023 CLINICAL DATA:  27 year old male with worsening right foot infection status post amputation. Seen this morning by wound care and redirected to the emergency department for possible debridement and IV antibiotics. Purulent drainage and pain to previous amputation site. Prior MRI 01/30/2023 without evidence of abscess. New suspected abscess. EXAM: MRI OF THE RIGHT FOREFOOT WITHOUT CONTRAST TECHNIQUE: Multiplanar, multisequence MR imaging of the right foot radiographs 02/13/2023 and 01/30/2023; was performed. No intravenous contrast was administered. COMPARISON:  Right foot radiographs 02/13/2023 and 10/04/2022; MRI right forefoot 01/30/2022 FINDINGS: Despite efforts by the technologist and patient, mild-to-moderate motion artifact is present on today's exam and could not be eliminated. This reduces exam sensitivity and specificity. Bones/Joint/Cartilage Postsurgical changes are again seen of amputation of fifth digit to the mid shaft of the fifth metatarsal. Normal marrow signal is seen within the remaining fifth metatarsal. There is trace marrow edema at the proximal plantar aspect of the proximal phalanx of the fourth digit (sagittal series 8, image 17). In the region of the soft tissue swelling and sinus tracts at the distal lateral aspect of the forefoot described below,there is mild marrow edema within the plantar and lateral aspects of the fourth metatarsal head, however no significant cortical erosion is seen. Findings could represent non-infectious osteitis, however are highly concerning for osteomyelitis given the adjacent sinus tract. Ligaments The Lisfranc ligament complex is intact. The visualized collateral ligaments appear  intact. Muscles and Tendons There is diffuse edema seen within the plantar greater than dorsal midfoot musculature, nonspecific myositis. Soft tissues There is new moderate edema and swelling within the soft tissues just plantar lateral to the 4th toe base of the proximal phalanx (axial series 5, images 34 through 39), and this is contiguous with soft tissue swelling and edema more proximally within the dorsal greater than plantar soft tissues at the level of the distal fourth metaphysis (axial series 5 images 40 through 49). Within the soft tissue swelling just plantar lateral to the fourth metatarsophalangeal joint there is a fluid bright sinus tract draining from the lateral aspect of the fourth metatarsal head to the ventral lateral skin surface (coronal series 7 images 19 through  21 and axial series 5 images 36 through 39. There is mild edema within the plantar and lateral aspects of the fourth metatarsal head, however no significant cortical erosion is seen. There is an additional sinus tract that extends dorsally in the region just lateral and dorsal to the proximal aspect of the fourth metatarsal head and the distal shaft of the fourth metatarsal (axial series 5 images 45 through 49 and coronal series 7 images 11 through 14), draining to the skin surface. The deepest aspect of this fluid pools in a walled-off pocket of fluid that measures up to approximately 8 x 27 x 11 mm (transverse by long axis of the foot by short axis of the foot as measured on coronal image 13 and axial image 46) suspicious for an abscess. There are scattered foci of decreased T1 and decreased T2 signal air within the surrounding soft tissues. IMPRESSION: Compared to 01/30/2023: 1. Postsurgical changes are again seen of amputation of the fifth digit to the level of the mid shaft of the metatarsal. 2. New soft tissue edema, swelling, and draining sinus tracts dorsal and lateral to the distal metatarsal shaft through the proximal phalanx  of the fourth digit. This includes a walled-off pocket of fluid suspicious for an abscess measuring up to 27 mm along the long axis of the foot. There is marrow edema within the fourth metatarsal head and trace marrow edema within the proximal plantar aspect of the 4th proximal phalanx suspicious for acute osteomyelitis. Electronically Signed   By: Yvonne Kendall M.D.   On: 02/13/2023 19:51   DG Foot Complete Right  Result Date: 02/13/2023 CLINICAL DATA:  Status post fifth transmetatarsal amputation with worsening infection at the amputation site EXAM: RIGHT FOOT COMPLETE - 3 VIEW COMPARISON:  Right foot radiograph dated 10/04/2022, MR right foot dated 01/30/2023 FINDINGS: Postsurgical changes from fifth trans metatarsal amputation. There is no evidence of fracture or dislocation. There is no evidence of arthropathy or other focal bone abnormality. Soft tissue defect along the lateral forefoot at the amputation site. No underlying cortical erosion to suggest osteomyelitis. IMPRESSION: Soft tissue defect along the lateral forefoot at the amputation site. No underlying cortical erosion to suggest osteomyelitis. Electronically Signed   By: Darrin Nipper M.D.   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  ESR, CRP ordered -Vancomycin, Rocephin and Flagyl ordered -Dr Posey Pronto foot and ankle contacted by EDP, plans surgery for Friday -Hydrocodone PRN mild pain, morphine as needed severe pain -Zofran as needed  Diabetes mellitus type 1.5 -Sliding scale insulin ordered -Patient states he takes 26 units twice daily however, 15 units twice daily ordered.,  As patient's urrent glucose 136.  Per patient his diabetes is typically well-controlled, running 130's for the most part.  Dose decreased due to concern for hypoglycemia.  Titrate up long-acting insulin as needed  CAD -Stress test done in 09/06/2020  finding was consistent with prior inferior/inferior septal/inferior apical MI with mild to moderate peri-infarct ischemia. Mild anterior ischemia.  At the time he declined a left heart cath.  No heart catheter since been done. - -   Anxiety and depression -Cymbalta 30 mg p.o. daily and BuSpar 5 mg daily resumed   Essential hypertension -Toprol-XL 100 mg p.o. daily resumed   Mixed hyperlipidemia -Atorvastatin 10 mg p.o. daily resumed   Malnutrition of moderate degree   Derrell Milanes 02/13/2023, 9:50 PM

## 2023-02-13 NOTE — Progress Notes (Deleted)
Cardiology Office Note:    Date:  02/13/2023   ID:  [name], DOB [birth year], MRN [id number]  PCP:  Danna Hefty, DO   Speculator Providers Cardiologist:  None { Click to update primary MD,subspecialty MD or APP then REFRESH:1}    Referring MD: Danna Hefty, DO   No chief complaint on file. ***  History of Present Illness:    [name] is a 27 y.o. male with a hx of ***  History of palpitations, tachycardia Hypertension Type 2 diabetes  Previously seen by [name].  Cardiac monitor in 2021 showed no significant arrhythmias.  NST in 2021 was deemed abnormal, there was evidence of prior inferior/inferior septal/inferior apical MI with mild to moderate ischemia around the area.,  Study was deemed intermediate risk.  Echocardiogram showed preserved EF, no LVH, no RWMA, no significant valvular abnormalities.  Last seen by Dr. Debara Pickett on July 17, 2022.  It was thought that he had a degree of autonomic neuropathy, with a history of poorly controlled type 2 diabetes.  Patient had lost 50 pounds, A1c improved.  Patient reported positional dizziness and orthostasis.  Blood pressure soft in office.  He was advised to cut losartan in half, as well as reduced dose of Imdur. HR 113 in office.  Dr. Debara Pickett suspected that his stress test in 2021 was a false positive.  He was advised by Dr. Debara Pickett to stop losartan and Imdur at that office visit.  He was advised to switch from carvedilol to Toprol-XL 100 mg at night.  Was told to follow-up in 6 months.  Today he presents for 29-monthfollow-up.  He states the following     Past Medical History:  Diagnosis Date   Complication of anesthesia    Hard to wake up when he was younger once.   Diabetes mellitus, type II ([place]    Heart murmur    Hypertension     Past Surgical History:  Procedure Laterality Date   AMPUTATION Right 05/21/2021   Procedure: AMPUTATION RAY 5th;  Surgeon: WTrula Slade DPM;   [place]  Service: Podiatry;  Laterality: Right;   PALATE / UVULA BIOPSY / EXCISION     "growth removed"   UPPER GASTROINTESTINAL ENDOSCOPY     [place]   WOUND DEBRIDEMENT Right 05/24/2021   Procedure: RIGHT FOOT WOUND DEBRIDEMENT AND CLOSURE;  Surgeon: PEvelina Bucy DPM;  [place] [facility]  Service: Podiatry;  Laterality: Right;    Current Medications: No outpatient medications have been marked as taking for the 02/13/23 encounter (Appointment) with PFinis Bud NP.     Allergies:   Amoxicillin and Lexapro [escitalopram]   Social History   Socioeconomic History   Marital status: Single    Spouse name: Not on file   Number of children: Not on file   Years of education: Not on file   Highest education level: Not on file  Occupational History   Not on file  Tobacco Use   Smoking status: Never    Passive exposure: Yes   Smokeless tobacco: Never  Vaping Use   Vaping Use: Never used  Substance and Sexual Activity   Alcohol use: No   Drug use: No   Sexual activity: Not on file  Other Topics Concern   Not on file  Social History Narrative   Not on file   Social Determinants of Health   Financial Resource Strain: Not on file  Food Insecurity: Not on file  Transportation Needs: Not on file  Physical Activity: Not on file  Stress: Not on file  Social Connections: Not on file     Family History: The patient's ***family history includes Diabetes in his maternal grandmother; Healthy in his father, mother, sister, and sister; Hypertension in his maternal grandmother; Neuropathy in his maternal grandmother; Thyroid disease in his maternal grandmother.  ROS:   Please see the history of present illness.    *** All other systems reviewed and are negative.  EKGs/Labs/Other Studies Reviewed:    The following studies were reviewed today: ***  EKG:  EKG is *** ordered today.  The ekg ordered today demonstrates ***  Recent Labs: 08/23/2022:  ALT 13; BUN 14; Creatinine, Ser 0.67; Hemoglobin 14.9; Magnesium 2.0; Platelets 278; Potassium 4.2; Sodium 139 11/14/2022: TSH 1.54  Recent Lipid Panel No results found for: "CHOL", "TRIG", "HDL", "CHOLHDL", "VLDL", "LDLCALC", "LDLDIRECT"   Risk Assessment/Calculations:   {Does this patient have ATRIAL FIBRILLATION?[phone]}  No BP recorded.  {Refresh Note OR Click here to enter BP  :1}***         Physical Exam:    VS:  There were no vitals taken for this visit.    Wt Readings from Last 3 Encounters:  11/14/22 210 lb 3.2 oz (95.3 kg)  07/31/22 172 lb (78 kg)  07/17/22 170 lb (77.1 kg)     GEN: *** Well nourished, well developed in no acute distress HEENT: Normal NECK: No JVD; No carotid bruits LYMPHATICS: No lymphadenopathy CARDIAC: ***RRR, no murmurs, rubs, gallops RESPIRATORY:  Clear to auscultation without rales, wheezing or rhonchi  ABDOMEN: Soft, non-tender, non-distended MUSCULOSKELETAL:  No edema; No deformity  SKIN: Warm and dry NEUROLOGIC:  Alert and oriented x 3 PSYCHIATRIC:  Normal affect   ASSESSMENT:    No diagnosis found. PLAN:    In order of problems listed above:  ***      {Are you ordering a CV Procedure (e.g. stress test, cath, DCCV, TEE, etc)?   Press F2        :[id number]    Medication Adjustments/Labs and Tests Ordered: Current medicines are reviewed at length with the patient today.  Concerns regarding medicines are outlined above.  No orders of the defined types were placed in this encounter.  No orders of the defined types were placed in this encounter.   There are no Patient Instructions on file for this visit.   Signed, Finis Bud, NP  02/13/2023 8:36 AM    [place]

## 2023-02-13 NOTE — Progress Notes (Signed)
Pharmacy Antibiotic Note  [name] is a 27 y.o. male admitted on 02/13/2023 with  diabetic foot infection .  Pharmacy has been consulted for vanc dosing.  Pt was sent from wound clinic for eval of foot infection. Empiric vanc/ceftriaxone/flagyl have been ordered.  Scr 0.82  Plan: Vanc 1.25g IV q12>>AUC 429, scr 0.82   Weight: 85.7 kg (189 lb)  Temp (24hrs), Avg:98 F (36.7 C), Min:98 F (36.7 C), Max:98 F (36.7 C)  Recent Labs  Lab 02/13/23 1422 02/13/23 1832  WBC 14.8*  --   CREATININE 0.82  --   LATICACIDVEN  --  1.0    Estimated Creatinine Clearance: 164.6 mL/min (by C-G formula based on SCr of 0.82 mg/dL).    Allergies  Allergen Reactions   Amoxicillin Hives    Has patient had a PCN reaction causing immediate rash, facial/tongue/throat swelling, SOB or lightheadedness with hypotension: No Has patient had a PCN reaction causing severe rash involving mucus membranes or skin necrosis: No Has patient had a PCN reaction that required hospitalization: No Has patient had a PCN reaction occurring within the last 10 years: No If all of the above answers are "NO", then may proceed with Cephalosporin use.    Lexapro [Escitalopram] Other (See Comments)    Antimicrobials this admission: 2/21 vanc>> 2/21 ceftriaxone>>2/27 2/21 flagyl>>2/27  Dose adjustments this admission:   Microbiology results: 2/21 blood>>  Onnie Boer, PharmD, [place], AAHIVP, CPP Infectious Disease Pharmacist 02/13/2023 8:29 PM

## 2023-02-13 NOTE — ED Notes (Signed)
Receiving RN Carlean Jews has agreed to accept TOC once pt has arrived to inpatient unit, all questions and concerns address. Handoff report in pt's chart. Transport has been notified

## 2023-02-13 NOTE — ED Provider Notes (Signed)
[place] Provider Note   CSN: [id number] Arrival date & time: 02/13/23  1402     History  Chief Complaint  Patient presents with   Wound Check    [name] is a 27 y.o. male with history of uncontrolled type 1 diabetes, GERD, osteomyelitis of right foot who presents the emergency department for a wound check.  Patient follows with wound care for worsening right foot infection s/p amputation.  Says he was seen this morning by wound care and redirected to the ER for possible debridement and IV antibiotics. Toe initially amputated by Dr Jacqualyn Posey with [facility]. Reports having reassuring MRI on 2/7. He started having pain in the right foot 2 days ago and since he has severe neuropathy and normally cannot feel his feet, this worried him. He went to Wound Care and they removed his casting to find the wound in its current state. Wound care team is concerned he may have developed an abscess since then with subsequent osteomyelitis.  Denies any fevers. 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    Wound Check       Home Medications Prior to Admission medications   Medication Sig Start Date End Date Taking? Authorizing Provider  aspirin EC 81 MG tablet Take 1 tablet (81 mg total) by mouth daily. 09/13/20   Verta Ellen., NP  atorvastatin (LIPITOR) 10 MG tablet Take 1 tablet by mouth daily.    [provider]  blood glucose meter kit and supplies KIT Dispense based on patient and insurance preference. Use up to four times daily as directed. (FOR ICD-10 E11.65). 01/13/19   Cassandria Anger, MD  busPIRone (BUSPAR) 5 MG tablet Take 5 mg by mouth 2 (two) times daily. 07/26/22   [provider]  busPIRone (BUSPAR) 5 MG tablet 1 tablet Orally Twice a day for 90 days    [provider]  Dulaglutide (TRULICITY) 1.5 0000000 SOPN Inject 1.5 mg into the skin once a week. 11/14/22   Philemon Kingdom, MD  DULoxetine (CYMBALTA)  30 MG capsule Take 30 mg by mouth daily. 07/18/22   [provider]  DULoxetine (CYMBALTA) 30 MG capsule 1 capsule Orally Once a day for 90 days    [provider]  FARXIGA 10 MG TABS tablet Take 10 mg by mouth daily. 01/25/22   [provider]  GVOKE HYPOPEN 1-PACK 1 MG/0.2ML SOAJ Inject 1 mg under skin as needed for hypoglycemia 05/14/22   Philemon Kingdom, MD  hydrOXYzine (ATARAX) 50 MG tablet 1 tablet as needed Orally Once a day for 90 days    [provider]  ibuprofen (ADVIL) 800 MG tablet Take 800 mg by mouth 3 (three) times daily as needed. 09/10/22   [provider]  insulin glargine (LANTUS SOLOSTAR) 100 UNIT/ML Solostar Pen Inject 20 Units into the skin daily. Patient taking differently: Inject 24-26 Units into the skin 2 (two) times daily. 07/31/22   Philemon Kingdom, MD  Insulin Pen Needle (CARETOUCH PEN NEEDLES) 31G X 6 MM MISC Use 6x a day 10/18/20   Philemon Kingdom, MD  Lancet Devices [facility]) lancets Use as instructed 4 x daily. e11.65 04/03/17   Cassandria Anger, MD  Lancets (ACCU-CHEK SOFT TOUCH) lancets Use as instructed 08/10/18   Isla Pence, MD  metoprolol succinate (TOPROL-XL) 100 MG 24 hr tablet Take 1 tablet (100 mg total) by mouth at bedtime. Take with or immediately following a meal. 07/17/22 07/12/23  Hilty,  Nadean Corwin, MD  nitroGLYCERIN (NITROSTAT) 0.4 MG SL tablet Place 1 tablet (0.4 mg total) under the tongue every 5 (five) minutes as needed for chest pain. 09/13/20 10/30/21  Verta Ellen., NP  nitroGLYCERIN (NITROSTAT) 0.4 MG SL tablet as directed Sublingual    [provider]  OVER THE COUNTER MEDICATION Place 4-5 drops into the left ear daily as needed (ringing in ear). Ear ringing medication    [provider]  Semaglutide, 1 MG/DOSE, 4 MG/3ML SOPN Inject 1 mg as directed once a week. 01/15/23   Philemon Kingdom, MD  SSD 1 % cream APPLY  CREAM EXTERNALLY TO AFFECTED AREA ONCE DAILY  11/23/22   Standiford, Nena Alexander, DPM  zolpidem (AMBIEN) 10 MG tablet Take 10 mg by mouth at bedtime. 08/17/20   [provider]      Allergies    Amoxicillin and Lexapro [escitalopram]    Review of Systems   Review of Systems  Constitutional:  Negative for chills and fever.  Musculoskeletal:  Positive for arthralgias.  Skin:  Positive for wound.  All other systems reviewed and are negative.   Physical Exam Updated Vital Signs BP (!) 140/95   Pulse 99   Temp 98 F (36.7 C)   Resp 18   Wt 85.7 kg   SpO2 100%   BMI 23.62 kg/m  Physical Exam Vitals and nursing note reviewed.  Constitutional:      Appearance: Normal appearance.  HENT:     Head: Normocephalic and atraumatic.  Eyes:     Conjunctiva/sclera: Conjunctivae normal.  Cardiovascular:     Rate and Rhythm: Normal rate and regular rhythm.  Pulmonary:     Effort: Pulmonary effort is normal. No respiratory distress.     Breath sounds: Normal breath sounds.  Abdominal:     General: There is no distension.     Palpations: Abdomen is soft.     Tenderness: There is no abdominal tenderness.  Feet:     Comments: S/p 5th right metatarsal amputation. Significant erythema, purulence of wet ulcer on lateral foot at side of 5th MT surgical site, erythema continues up the dorsum of the foot Skin:    General: Skin is warm and dry.  Neurological:     General: No focal deficit present.     Mental Status: He is alert.        ED Results / Procedures / Treatments   Labs (all labs ordered are listed, but only abnormal results are displayed) Labs Reviewed  CBC - Abnormal; Notable for the following components:      Result Value   WBC 14.8 (*)    All other components within normal limits  AEROBIC CULTURE W GRAM STAIN (SUPERFICIAL SPECIMEN)  CULTURE, BLOOD (ROUTINE X 2)  CULTURE, BLOOD (ROUTINE X 2)  BASIC METABOLIC PANEL  LACTIC ACID, PLASMA    EKG None  Radiology MR FOOT RIGHT WO CONTRAST  Result Date:  02/13/2023 CLINICAL DATA:  27 year old male with worsening right foot infection status post amputation. Seen this morning by wound care and redirected to the emergency department for possible debridement and IV antibiotics. Purulent drainage and pain to previous amputation site. Prior MRI 01/30/2023 without evidence of abscess. New suspected abscess. EXAM: MRI OF THE RIGHT FOREFOOT WITHOUT CONTRAST TECHNIQUE: Multiplanar, multisequence MR imaging of the right foot radiographs 02/13/2023 and 01/30/2023; was performed. No intravenous contrast was administered. COMPARISON:  Right foot radiographs 02/13/2023 and 10/04/2022; MRI right forefoot 01/30/2022 FINDINGS: Despite efforts by  the technologist and patient, mild-to-moderate motion artifact is present on today's exam and could not be eliminated. This reduces exam sensitivity and specificity. Bones/Joint/Cartilage Postsurgical changes are again seen of amputation of fifth digit to the mid shaft of the fifth metatarsal. Normal marrow signal is seen within the remaining fifth metatarsal. There is trace marrow edema at the proximal plantar aspect of the proximal phalanx of the fourth digit (sagittal series 8, image 17). In the region of the soft tissue swelling and sinus tracts at the distal lateral aspect of the forefoot described below,there is mild marrow edema within the plantar and lateral aspects of the fourth metatarsal head, however no significant cortical erosion is seen. Findings could represent non-infectious osteitis, however are highly concerning for osteomyelitis given the adjacent sinus tract. Ligaments The Lisfranc ligament complex is intact. The visualized collateral ligaments appear intact. Muscles and Tendons There is diffuse edema seen within the plantar greater than dorsal midfoot musculature, nonspecific myositis. Soft tissues There is new moderate edema and swelling within the soft tissues just plantar lateral to the 4th toe base of the proximal  phalanx (axial series 5, images 34 through 39), and this is contiguous with soft tissue swelling and edema more proximally within the dorsal greater than plantar soft tissues at the level of the distal fourth metaphysis (axial series 5 images 40 through 49). Within the soft tissue swelling just plantar lateral to the fourth metatarsophalangeal joint there is a fluid bright sinus tract draining from the lateral aspect of the fourth metatarsal head to the ventral lateral skin surface (coronal series 7 images 19 through 21 and axial series 5 images 36 through 39. There is mild edema within the plantar and lateral aspects of the fourth metatarsal head, however no significant cortical erosion is seen. There is an additional sinus tract that extends dorsally in the region just lateral and dorsal to the proximal aspect of the fourth metatarsal head and the distal shaft of the fourth metatarsal (axial series 5 images 45 through 49 and coronal series 7 images 11 through 14), draining to the skin surface. The deepest aspect of this fluid pools in a walled-off pocket of fluid that measures up to approximately 8 x 27 x 11 mm (transverse by long axis of the foot by short axis of the foot as measured on coronal image 13 and axial image 46) suspicious for an abscess. There are scattered foci of decreased T1 and decreased T2 signal air within the surrounding soft tissues. IMPRESSION: Compared to 01/30/2023: 1. Postsurgical changes are again seen of amputation of the fifth digit to the level of the mid shaft of the metatarsal. 2. New soft tissue edema, swelling, and draining sinus tracts dorsal and lateral to the distal metatarsal shaft through the proximal phalanx of the fourth digit. This includes a walled-off pocket of fluid suspicious for an abscess measuring up to 27 mm along the long axis of the foot. There is marrow edema within the fourth metatarsal head and trace marrow edema within the proximal plantar aspect of the 4th  proximal phalanx suspicious for acute osteomyelitis. Electronically Signed   By: Yvonne Kendall M.D.   On: 02/13/2023 19:51   DG Foot Complete Right  Result Date: 02/13/2023 CLINICAL DATA:  Status post fifth transmetatarsal amputation with worsening infection at the amputation site EXAM: RIGHT FOOT COMPLETE - 3 VIEW COMPARISON:  Right foot radiograph dated 10/04/2022, MR right foot dated 01/30/2023 FINDINGS: Postsurgical changes from fifth trans metatarsal amputation. There is no evidence  of fracture or dislocation. There is no evidence of arthropathy or other focal bone abnormality. Soft tissue defect along the lateral forefoot at the amputation site. No underlying cortical erosion to suggest osteomyelitis. IMPRESSION: Soft tissue defect along the lateral forefoot at the amputation site. No underlying cortical erosion to suggest osteomyelitis. Electronically Signed   By: Darrin Nipper M.D.   On: 02/13/2023 14:59    Procedures Procedures    Medications Ordered in ED Medications  cefTRIAXone (ROCEPHIN) 2 g in sodium chloride 0.9 % 100 mL IVPB (2 g Intravenous New Bag/Given 02/13/23 2027)  metroNIDAZOLE (FLAGYL) IVPB 500 mg (has no administration in time range)  vancomycin (VANCOREADY) IVPB 1250 mg/250 mL (has no administration in time range)    ED Course/ Medical Decision Making/ A&P                             Medical Decision Making Amount and/or Complexity of Data Reviewed Labs: ordered. Radiology: ordered.  This patient is a 27 y.o. male  who presents to the ED for concern of right foot wound infection.   Differential diagnoses prior to evaluation: The emergent differential diagnosis includes, but is not limited to,  sepsis, cellulitis, osteomyelitis. This is not an exhaustive differential.   Past Medical History / Co-morbidities: uncontrolled type 1 diabetes, GERD, osteomyelitis of right foot   Additional history: Chart reviewed. Pertinent results include: Had original right foot  wound, received amputation of right 5th metatarsal by Dr Jacqualyn Posey with podiatry.   Physical Exam: Physical exam performed. The pertinent findings include: Significant erythema and purulence at the site of right 5th metatarsal amputation.   Lab Tests/Imaging studies: I personally interpreted labs/imaging and the pertinent results include: Leukocytosis of 14.8, normal hemoglobin.  BMP unremarkable.  Lactic acid 1.  Blood cultures collected.  Aerobic culture with Gram stain collected.  X-ray showed soft tissue defect at lateral forefoot.  Follow-up MRI showed soft tissue edema and draining sinus tracts to distal metatarsal shaft of the fourth digit.  Walled off pocket of fluid suspicious for an abscess along the long axis of the foot.  Marrow edema within fourth metatarsal head and fourth proximal phalanx suspicious for acute osteomyelitis.  I agree with the radiologist interpretation.  Medications: I ordered medication including IV antibiotics per ED lower extremity protocol, moderate to severe infection with history of MRSA.  I have reviewed the patients home medicines and have made adjustments as needed.  Consultations obtained: I consulted with on call podiatrist Dr Posey Pronto who recommended medical admission for ABX and he will see in the AM to plan for surgical debridement on likely Friday.  I consulted with hospitalist Dr Claria Dice with [facility] who will admit.    Disposition: After consideration of the diagnostic results and the patients response to treatment, I feel that patient is requiring admission for osteomyelitis. Will admit to medicine with plan for podiatry surgical consult in the AM.   Final Clinical Impression(s) / ED Diagnoses Final diagnoses:  Other acute osteomyelitis of right foot ([place])    Rx / DC Orders ED Discharge Orders     None      Portions of this report may have been transcribed using voice recognition software. Every effort was made to ensure accuracy; however,  inadvertent computerized transcription errors may be present.    Estill Cotta 02/13/23 2049    Regan Lemming, MD 02/14/23 1327

## 2023-02-13 NOTE — ED Triage Notes (Signed)
Patient sent to ED by woundcare for further evaluation of wounds on foot, recent partial amputation on right foot.

## 2023-02-13 NOTE — ED Notes (Signed)
ED TO INPATIENT HANDOFF REPORT  ED Nurse Name and Phone #: [phone] [facility]  S Name/Age/Gender [name] 27 y.o. male Room/Bed: 018C/018C  Code Status   Code Status: Prior  Home/SNF/Other Home Patient oriented to: self, place, time, and situation Is this baseline? Yes   Triage Complete: Triage complete  Chief Complaint Osteomyelitis [facility]) [M86.9]  Triage Note Patient sent to ED by woundcare for further evaluation of wounds on foot, recent partial amputation on right foot.   Allergies Allergies  Allergen Reactions   Amoxicillin Hives    Has patient had a PCN reaction causing immediate rash, facial/tongue/throat swelling, SOB or lightheadedness with hypotension: No Has patient had a PCN reaction causing severe rash involving mucus membranes or skin necrosis: No Has patient had a PCN reaction that required hospitalization: No Has patient had a PCN reaction occurring within the last 10 years: No If all of the above answers are "NO", then may proceed with Cephalosporin use.    Lexapro [Escitalopram] Other (See Comments)    Level of Care/Admitting Diagnosis ED Disposition     ED Disposition  Admit   Condition  --   Comment  Hospital Area: [place] [id number]  Level of Care: Med-Surg [16]  May admit patient to [facility] or [facility] if equivalent level of care is available:: Yes  Covid Evaluation: Confirmed COVID Negative  Diagnosis: Osteomyelitis ([place]) [id number]  Admitting Physician: Haywood Pao  Attending Physician: Quintella Baton [id number]  Certification:: I certify this patient will need inpatient services for at least 2 midnights  Estimated Length of Stay: 2          B Medical/Surgery History Past Medical History:  Diagnosis Date   Complication of anesthesia    Hard to wake up when he was younger once.   Diabetes mellitus, type II ([place])    Heart murmur    Hypertension    Past Surgical History:  Procedure Laterality  Date   AMPUTATION Right 05/21/2021   Procedure: AMPUTATION RAY 5th;  Surgeon: Trula Slade, DPM;  Location: [place];  Service: Podiatry;  Laterality: Right;   PALATE / UVULA BIOPSY / EXCISION     "growth removed"   UPPER GASTROINTESTINAL ENDOSCOPY     Done in [place] - 2013 ish   WOUND DEBRIDEMENT Right 05/24/2021   Procedure: RIGHT FOOT WOUND DEBRIDEMENT AND CLOSURE;  Surgeon: Evelina Bucy, DPM;  Location: [place];  Service: Podiatry;  Laterality: Right;     A IV Location/Drains/Wounds Patient Lines/Drains/Airways Status     Active Line/Drains/Airways     Name Placement date Placement time Site Days   Peripheral IV 02/13/23 20 G Anterior;Right Forearm 02/13/23  1839  Forearm  less than 1            Intake/Output Last 24 hours  Intake/Output Summary (Last 24 hours) at 02/13/2023 2056 Last data filed at 02/13/2023 2055 Gross per 24 hour  Intake 100 ml  Output --  Net 100 ml    Labs/Imaging Results for orders placed or performed during the hospital encounter of 02/13/23 (from the past 48 hour(s))  CBC     Status: Abnormal   Collection Time: 02/13/23  2:22 PM  Result Value Ref Range   WBC 14.8 (H) 4.0 - 10.5 K/uL   RBC 5.43 4.22 - 5.81 MIL/uL   Hemoglobin 15.7 13.0 - 17.0 g/dL   HCT 48.1 39.0 - 52.0 %   MCV 88.6 80.0 - 100.0 fL  MCH 28.9 26.0 - 34.0 pg   MCHC 32.6 30.0 - 36.0 g/dL   RDW 12.6 11.5 - 15.5 %   Platelets 370 150 - 400 K/uL   nRBC 0.0 0.0 - 0.2 %    Comment: Performed at [facility], [place] [address] [place], [place] [id number]  Basic metabolic panel     Status: None   Collection Time: 02/13/23  2:22 PM  Result Value Ref Range   Sodium 137 135 - 145 mmol/L   Potassium 4.5 3.5 - 5.1 mmol/L   Chloride 99 98 - 111 mmol/L   CO2 25 22 - 32 mmol/L   Glucose, Bld 92 70 - 99 mg/dL    Comment: Glucose reference range applies only to samples taken after fasting for at least 8 hours.   BUN 17 6 - 20 mg/dL   Creatinine, Ser 0.82 0.61 - 1.24 mg/dL    Calcium 10.0 8.9 - 10.3 mg/dL   GFR, Estimated >60 >60 mL/min    Comment: (NOTE) Calculated using the CKD-EPI Creatinine Equation (2021)    Anion gap 13 5 - 15    Comment: Performed at [facility] [address] [place], [place] [address]  Lactic acid, plasma     Status: None   Collection Time: 02/13/23  6:32 PM  Result Value Ref Range   Lactic Acid, Venous 1.0 0.5 - 1.9 mmol/L    Comment: Performed at [facility] [address] [place], [place] [address]   MR FOOT RIGHT WO CONTRAST  Result Date: 02/13/2023 CLINICAL DATA:  27 year old male with worsening right foot infection status post amputation. Seen this morning by wound care and redirected to the emergency department for possible debridement and IV antibiotics. Purulent drainage and pain to previous amputation site. Prior MRI 01/30/2023 without evidence of abscess. New suspected abscess. EXAM: MRI OF THE RIGHT FOREFOOT WITHOUT CONTRAST TECHNIQUE: Multiplanar, multisequence MR imaging of the right foot radiographs 02/13/2023 and 01/30/2023; was performed. No intravenous contrast was administered. COMPARISON:  Right foot radiographs 02/13/2023 and 10/04/2022; MRI right forefoot 01/30/2022 FINDINGS: Despite efforts by the technologist and patient, mild-to-moderate motion artifact is present on today's exam and could not be eliminated. This reduces exam sensitivity and specificity. Bones/Joint/Cartilage Postsurgical changes are again seen of amputation of fifth digit to the mid shaft of the fifth metatarsal. Normal marrow signal is seen within the remaining fifth metatarsal. There is trace marrow edema at the proximal plantar aspect of the proximal phalanx of the fourth digit (sagittal series 8, image 17). In the region of the soft tissue swelling and sinus tracts at the distal lateral aspect of the forefoot described below,there is mild marrow edema within the plantar and lateral aspects of the fourth metatarsal head, however no  significant cortical erosion is seen. Findings could represent non-infectious osteitis, however are highly concerning for osteomyelitis given the adjacent sinus tract. Ligaments The Lisfranc ligament complex is intact. The visualized collateral ligaments appear intact. Muscles and Tendons There is diffuse edema seen within the plantar greater than dorsal midfoot musculature, nonspecific myositis. Soft tissues There is new moderate edema and swelling within the soft tissues just plantar lateral to the 4th toe base of the proximal phalanx (axial series 5, images 34 through 39), and this is contiguous with soft tissue swelling and edema more proximally within the dorsal greater than plantar soft tissues at the level of the distal fourth metaphysis (axial series 5 images 40 through 49). Within the  soft tissue swelling just plantar lateral to the fourth metatarsophalangeal joint there is a fluid bright sinus tract draining from the lateral aspect of the fourth metatarsal head to the ventral lateral skin surface (coronal series 7 images 19 through 21 and axial series 5 images 36 through 39. There is mild edema within the plantar and lateral aspects of the fourth metatarsal head, however no significant cortical erosion is seen. There is an additional sinus tract that extends dorsally in the region just lateral and dorsal to the proximal aspect of the fourth metatarsal head and the distal shaft of the fourth metatarsal (axial series 5 images 45 through 49 and coronal series 7 images 11 through 14), draining to the skin surface. The deepest aspect of this fluid pools in a walled-off pocket of fluid that measures up to approximately 8 x 27 x 11 mm (transverse by long axis of the foot by short axis of the foot as measured on coronal image 13 and axial image 46) suspicious for an abscess. There are scattered foci of decreased T1 and decreased T2 signal air within the surrounding soft tissues. IMPRESSION: Compared to 01/30/2023:  1. Postsurgical changes are again seen of amputation of the fifth digit to the level of the mid shaft of the metatarsal. 2. New soft tissue edema, swelling, and draining sinus tracts dorsal and lateral to the distal metatarsal shaft through the proximal phalanx of the fourth digit. This includes a walled-off pocket of fluid suspicious for an abscess measuring up to 27 mm along the long axis of the foot. There is marrow edema within the fourth metatarsal head and trace marrow edema within the proximal plantar aspect of the 4th proximal phalanx suspicious for acute osteomyelitis. Electronically Signed   By: Yvonne Kendall M.D.   On: 02/13/2023 19:51   DG Foot Complete Right  Result Date: 02/13/2023 CLINICAL DATA:  Status post fifth transmetatarsal amputation with worsening infection at the amputation site EXAM: RIGHT FOOT COMPLETE - 3 VIEW COMPARISON:  Right foot radiograph dated 10/04/2022, MR right foot dated 01/30/2023 FINDINGS: Postsurgical changes from fifth trans metatarsal amputation. There is no evidence of fracture or dislocation. There is no evidence of arthropathy or other focal bone abnormality. Soft tissue defect along the lateral forefoot at the amputation site. No underlying cortical erosion to suggest osteomyelitis. IMPRESSION: Soft tissue defect along the lateral forefoot at the amputation site. No underlying cortical erosion to suggest osteomyelitis. Electronically Signed   By: Darrin Nipper M.D.   On: 02/13/2023 14:59    Pending Labs Unresulted Labs (From admission, onward)     Start     Ordered   02/13/23 1812  Blood culture (routine x 2)  BLOOD CULTURE X 2,   R (with STAT occurrences)      02/13/23 1811   02/13/23 1230  Aerobic Culture w Gram Stain (superficial specimen)  Once,   R        02/13/23 1230            Vitals/Pain Today's Vitals   02/13/23 1839 02/13/23 2000 02/13/23 2030 02/13/23 2055  BP:   135/83   Pulse:   98   Resp:   17   Temp: 98 F (36.7 C)   98.9 F (37.2  C)  TempSrc:    Oral  SpO2:   100%   Weight:  85.7 kg    PainSc:        Isolation Precautions No active isolations  Medications Medications  cefTRIAXone (ROCEPHIN) 2  g in sodium chloride 0.9 % 100 mL IVPB (0 g Intravenous Stopped 02/13/23 2055)  metroNIDAZOLE (FLAGYL) IVPB 500 mg (500 mg Intravenous New Bag/Given 02/13/23 2055)  vancomycin (VANCOREADY) IVPB 1250 mg/250 mL (has no administration in time range)    Mobility walks      R Recommendations: See Admitting Provider Note  Report given to:   Additional Notes: Pt A&O x4, ambulates at baseline, has ortho shoe to left foot and right foot is red and inflamed, area has been marked for redness, ABX has been imitated. Pt can use a urinal.

## 2023-02-13 NOTE — ED Provider Triage Note (Signed)
Emergency Medicine Provider Triage Evaluation Note  [name] , a 27 y.o. male  was evaluated in triage.  Pt complains of worsening right foot infection status post amputation.  Patient was seen this morning by wound care and redirected to ED for possible debridement, IV antibiotics.  Patient denies fevers, nausea or vomiting.  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  Patient reports purulent drainage, pain to previous amputation site.  The patient had unremarkable MRI of foot done on 2/7 which did not show evidence of abscess.  Patient wound care team believes that abscess has formed since MRI on 2/7.  Patient seen here for further management.  Review of Systems  Positive:  Negative:   Physical Exam  There were no vitals taken for this visit. Gen:   Awake, no distress   Resp:  Normal effort  MSK:   Moves extremities without difficulty  Other:    Medical Decision Making  Medically screening exam initiated at 2:19 PM.  Appropriate orders placed.  [name] was informed that the remainder of the evaluation will be completed by another provider, this initial triage assessment does not replace that evaluation, and the importance of remaining in the ED until their evaluation is complete.     Azucena Cecil, PA-C 02/13/23 1420

## 2023-02-14 ENCOUNTER — Ambulatory Visit (HOSPITAL_BASED_OUTPATIENT_CLINIC_OR_DEPARTMENT_OTHER): Payer: BC Managed Care – PPO | Admitting: Internal Medicine

## 2023-02-14 DIAGNOSIS — M86172 Other acute osteomyelitis, left ankle and foot: Secondary | ICD-10-CM

## 2023-02-14 LAB — BASIC METABOLIC PANEL
Anion gap: 9 (ref 5–15)
BUN: 16 mg/dL (ref 6–20)
CO2: 27 mmol/L (ref 22–32)
Calcium: 9.4 mg/dL (ref 8.9–10.3)
Chloride: 101 mmol/L (ref 98–111)
Creatinine, Ser: 0.81 mg/dL (ref 0.61–1.24)
GFR, Estimated: >60 mL/min (ref >60)
Glucose, Bld: 63 mg/dL — ABNORMAL LOW (ref 70–99)
Potassium: 3.8 mmol/L (ref 3.5–5.1)
Sodium: 137 mmol/L (ref 135–145)

## 2023-02-14 LAB — CBC WITH DIFFERENTIAL/PLATELET
Abs Immature Granulocytes: 0.09 10*3/uL — ABNORMAL HIGH (ref 0.00–0.07)
Basophils Absolute: 0.1 10*3/uL (ref 0.0–0.1)
Basophils Relative: 1 %
Eosinophils Absolute: 0.1 10*3/uL (ref 0.0–0.5)
Eosinophils Relative: 1 %
HCT: 43.1 % (ref 39.0–52.0)
Hemoglobin: 14.8 g/dL (ref 13.0–17.0)
Immature Granulocytes: 1 %
Lymphocytes Relative: 25 %
Lymphs Abs: 3.5 10*3/uL (ref 0.7–4.0)
MCH: 29.7 pg (ref 26.0–34.0)
MCHC: 34.3 g/dL (ref 30.0–36.0)
MCV: 86.5 fL (ref 80.0–100.0)
Monocytes Absolute: 1.3 10*3/uL — ABNORMAL HIGH (ref 0.1–1.0)
Monocytes Relative: 10 %
Neutro Abs: 8.9 10*3/uL — ABNORMAL HIGH (ref 1.7–7.7)
Neutrophils Relative %: 62 %
Platelets: 318 10*3/uL (ref 150–400)
RBC: 4.98 MIL/uL (ref 4.22–5.81)
RDW: 12.6 % (ref 11.5–15.5)
WBC: 14 10*3/uL — ABNORMAL HIGH (ref 4.0–10.5)
nRBC: 0 % (ref 0.0–0.2)

## 2023-02-14 LAB — SEDIMENTATION RATE: Sed Rate: 102 mm/hr — ABNORMAL HIGH (ref 0–16)

## 2023-02-14 LAB — GLUCOSE, CAPILLARY
Glucose-Capillary: 132 mg/dL — ABNORMAL HIGH (ref 70–99)
Glucose-Capillary: 114 mg/dL — ABNORMAL HIGH (ref 70–99)
Glucose-Capillary: 137 mg/dL — ABNORMAL HIGH (ref 70–99)
Glucose-Capillary: 137 mg/dL — ABNORMAL HIGH (ref 70–99)
Glucose-Capillary: 85 mg/dL (ref 70–99)

## 2023-02-14 LAB — MAGNESIUM: Magnesium: 2.2 mg/dL (ref 1.7–2.4)

## 2023-02-14 LAB — C-REACTIVE PROTEIN: CRP: 10.7 mg/dL — ABNORMAL HIGH (ref <1.0)

## 2023-02-14 LAB — MRSA NEXT GEN BY PCR, NASAL: MRSA by PCR Next Gen: DETECTED — AB

## 2023-02-14 MED ORDER — INSULIN GLARGINE-YFGN 100 UNIT/ML ~~LOC~~ SOLN
10.0000 [IU] | Freq: Two times a day (BID) | SUBCUTANEOUS | Status: DC
  Administered 2023-02-14 – 2023-02-19 (×10): 10 [IU] via SUBCUTANEOUS
  Filled 2023-02-14 (×12): qty 0.1

## 2023-02-14 MED ORDER — CHLORHEXIDINE GLUCONATE CLOTH 2 % EX PADS
6.0000 | MEDICATED_PAD | Freq: Once | CUTANEOUS | Status: AC
  Administered 2023-02-14: 6 via TOPICAL

## 2023-02-14 MED ORDER — CHLORHEXIDINE GLUCONATE CLOTH 2 % EX PADS: 6.0000 | MEDICATED_PAD | Freq: Once | CUTANEOUS | Status: AC

## 2023-02-14 MED ORDER — MUPIROCIN 2 % EX OINT
1.0000 | TOPICAL_OINTMENT | Freq: Two times a day (BID) | CUTANEOUS | Status: DC
  Administered 2023-02-15 – 2023-02-19 (×9): 1 via NASAL
  Filled 2023-02-14 (×2): qty 22

## 2023-02-14 NOTE — Consult Note (Addendum)
Reason for Consult:Osteomyelitis right foot Referring Physician: Dr. Eric [place] DO  [name] is an 27 y.o. male.  HPI: 27 year old male with known history of diabetes mellitus type 1.5, peripheral arterial insufficiency, hypertension, hyperlipidemia.  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  Patient reports recurrent ulcers on the right foot.  Recent injury to the wound care center had a total contact cast performed.  Wound was removed yesterday.  His infection he was admitted to the hospital for this.  Wound cultures obtained at the wound care center yesterday.  Currently denies any fevers or chills.  He feels that the infection has improved today on IV antibiotics.  Past Medical History:  Diagnosis Date   Complication of anesthesia    Hard to wake up when he was younger once.   Diabetes mellitus, type II ([place])    Heart murmur    Hypertension     Past Surgical History:  Procedure Laterality Date   AMPUTATION Right 05/21/2021   Procedure: AMPUTATION RAY 5th;  Surgeon: Trula Slade, DPM;  Location: [facility];  Service: Podiatry;  Laterality: Right;   PALATE / UVULA BIOPSY / EXCISION     "growth removed"   UPPER GASTROINTESTINAL ENDOSCOPY     Done in [place] - 2013 ish   WOUND DEBRIDEMENT Right 05/24/2021   Procedure: RIGHT FOOT WOUND DEBRIDEMENT AND CLOSURE;  Surgeon: Evelina Bucy, DPM;  Location: [facility];  Service: Podiatry;  Laterality: Right;    Family History  Problem Relation Age of Onset   Healthy Mother    Healthy Father    Healthy Sister    Healthy Sister    Diabetes Maternal Grandmother    Thyroid disease Maternal Grandmother    Hypertension Maternal Grandmother    Neuropathy Maternal Grandmother     Social History:  reports that he has never smoked. He has been exposed to tobacco smoke. He has never used smokeless tobacco. He reports that he does not drink alcohol and does not use drugs.  Allergies:  Allergies  Allergen Reactions    Amoxicillin Hives    Has patient had a PCN reaction causing immediate rash, facial/tongue/throat swelling, SOB or lightheadedness with hypotension: No Has patient had a PCN reaction causing severe rash involving mucus membranes or skin necrosis: No Has patient had a PCN reaction that required hospitalization: No Has patient had a PCN reaction occurring within the last 10 years: No If all of the above answers are "NO", then may proceed with Cephalosporin use.    Lexapro [Escitalopram] Other (See Comments)    Medications: I have reviewed the patient's current medications.  Results for orders placed or performed during the hospital encounter of 02/13/23 (from the past 48 hour(s))  Aerobic Culture w Gram Stain (superficial specimen)     Status: None (Preliminary result)   Collection Time: 02/13/23 12:30 PM   Specimen: Wound  Result Value Ref Range   Specimen Description      WOUND RIGHT FOOT Performed at [facility] [address] [place], [place] [address]    Special Requests      NONE Performed at [facility], [place] [address], [place], [place] [address]    Gram Stain      RARE WBC PRESENT, PREDOMINANTLY PMN MODERATE GRAM POSITIVE COCCI    Culture      TOO YOUNG TO READ Performed at [facility], [place] [address], [place], [place] [address]    Report Status  PENDING   CBC     Status: Abnormal   Collection Time: 02/13/23  2:22 PM  Result Value Ref Range   WBC 14.8 (H) 4.0 - 10.5 K/uL   RBC 5.43 4.22 - 5.81 MIL/uL   Hemoglobin 15.7 13.0 - 17.0 g/dL   HCT 48.1 39.0 - 52.0 %   MCV 88.6 80.0 - 100.0 fL   MCH 28.9 26.0 - 34.0 pg   MCHC 32.6 30.0 - 36.0 g/dL   RDW 12.6 11.5 - 15.5 %   Platelets 370 150 - 400 K/uL   nRBC 0.0 0.0 - 0.2 %    Comment: Performed at [facility], [place] [address] [place], [place] [id number]  Basic metabolic panel     Status: None   Collection Time: 02/13/23  2:22 PM  Result Value Ref Range   Sodium  137 135 - 145 mmol/L   Potassium 4.5 3.5 - 5.1 mmol/L   Chloride 99 98 - 111 mmol/L   CO2 25 22 - 32 mmol/L   Glucose, Bld 92 70 - 99 mg/dL    Comment: Glucose reference range applies only to samples taken after fasting for at least 8 hours.   BUN 17 6 - 20 mg/dL   Creatinine, Ser 0.82 0.61 - 1.24 mg/dL   Calcium 10.0 8.9 - 10.3 mg/dL   GFR, Estimated >60 >60 mL/min    Comment: (NOTE) Calculated using the CKD-EPI Creatinine Equation (2021)    Anion gap 13 5 - 15    Comment: Performed at [facility] [address], [place], [place] [address]  Lactic acid, plasma     Status: None   Collection Time: 02/13/23  6:32 PM  Result Value Ref Range   Lactic Acid, Venous 1.0 0.5 - 1.9 mmol/L    Comment: Performed at [facility] [address] [place], [place] [address]  Blood culture (routine x 2)     Status: None (Preliminary result)   Collection Time: 02/13/23  6:32 PM   Specimen: BLOOD LEFT HAND  Result Value Ref Range   Specimen Description BLOOD LEFT HAND    Special Requests      BOTTLES DRAWN AEROBIC AND ANAEROBIC Blood Culture results may not be optimal due to an excessive volume of blood received in culture bottles   Culture      NO GROWTH < 12 HOURS Performed at [facility] [address] [place], [place] [address]    Report Status PENDING   Blood culture (routine x 2)     Status: None (Preliminary result)   Collection Time: 02/13/23  6:32 PM   Specimen: BLOOD RIGHT WRIST  Result Value Ref Range   Specimen Description BLOOD RIGHT WRIST    Special Requests      BOTTLES DRAWN AEROBIC AND ANAEROBIC Blood Culture results may not be optimal due to an inadequate volume of blood received in culture bottles   Culture      NO GROWTH < 12 HOURS Performed at [facility], [place] [address], [place], [place] [address]    Report Status PENDING   Glucose, capillary     Status: None   Collection Time: 02/13/23 10:25 PM  Result Value Ref Range    Glucose-Capillary 80 70 - 99 mg/dL    Comment: Glucose reference range applies only to samples taken after fasting for at least 8 hours.  Magnesium     Status: None   Collection Time: 02/14/23  4:00 AM  Result Value Ref Range   Magnesium 2.2 1.7 - 2.4 mg/dL    Comment: Performed at [facility] [address] [place], [place] [id number]  Basic metabolic panel     Status: Abnormal   Collection Time: 02/14/23  4:00 AM  Result Value Ref Range   Sodium 137 135 - 145 mmol/L   Potassium 3.8 3.5 - 5.1 mmol/L   Chloride 101 98 - 111 mmol/L   CO2 27 22 - 32 mmol/L   Glucose, Bld 63 (L) 70 - 99 mg/dL    Comment: Glucose reference range applies only to samples taken after fasting for at least 8 hours.   BUN 16 6 - 20 mg/dL   Creatinine, Ser 0.81 0.61 - 1.24 mg/dL   Calcium 9.4 8.9 - 10.3 mg/dL   GFR, Estimated >60 >60 mL/min    Comment: (NOTE) Calculated using the CKD-EPI Creatinine Equation (2021)    Anion gap 9 5 - 15    Comment: Performed at [facility] [address], [place], [place] [address]  CBC with Differential/Platelet     Status: Abnormal   Collection Time: 02/14/23  4:00 AM  Result Value Ref Range   WBC 14.0 (H) 4.0 - 10.5 K/uL   RBC 4.98 4.22 - 5.81 MIL/uL   Hemoglobin 14.8 13.0 - 17.0 g/dL   HCT 43.1 39.0 - 52.0 %   MCV 86.5 80.0 - 100.0 fL   MCH 29.7 26.0 - 34.0 pg   MCHC 34.3 30.0 - 36.0 g/dL   RDW 12.6 11.5 - 15.5 %   Platelets 318 150 - 400 K/uL   nRBC 0.0 0.0 - 0.2 %   Neutrophils Relative % 62 %   Neutro Abs 8.9 (H) 1.7 - 7.7 K/uL   Lymphocytes Relative 25 %   Lymphs Abs 3.5 0.7 - 4.0 K/uL   Monocytes Relative 10 %   Monocytes Absolute 1.3 (H) 0.1 - 1.0 K/uL   Eosinophils Relative 1 %   Eosinophils Absolute 0.1 0.0 - 0.5 K/uL   Basophils Relative 1 %   Basophils Absolute 0.1 0.0 - 0.1 K/uL   Immature Granulocytes 1 %   Abs Immature Granulocytes 0.09 (H) 0.00 - 0.07 K/uL    Comment: Performed at [facility] [address]  , [place] [address]  Sedimentation rate     Status: Abnormal   Collection Time: 02/14/23  4:00 AM  Result Value Ref Range   Sed Rate 102 (H) 0 - 16 mm/hr    Comment: Performed at [facility] [address] [place], [place] [address]  C-reactive protein     Status: Abnormal   Collection Time: 02/14/23  4:00 AM  Result Value Ref Range   CRP 10.7 (H) <1.0 mg/dL    Comment: Performed at [facility] [address], [place], [place] [address]  Glucose, capillary     Status: Abnormal   Collection Time: 02/14/23  9:01 AM  Result Value Ref Range   Glucose-Capillary 132 (H) 70 - 99 mg/dL    Comment: Glucose reference range applies only to samples taken after fasting for at least 8 hours.  Glucose, capillary     Status: Abnormal   Collection Time: 02/14/23 11:14 AM  Result Value Ref Range   Glucose-Capillary 114 (H) 70 - 99 mg/dL    Comment: Glucose reference range applies only to samples taken after fasting for at least 8 hours.  Glucose, capillary  Status: Abnormal   Collection Time: 02/14/23  3:49 PM  Result Value Ref Range   Glucose-Capillary 137 (H) 70 - 99 mg/dL    Comment: Glucose reference range applies only to samples taken after fasting for at least 8 hours.    MR FOOT RIGHT WO CONTRAST  Result Date: 02/13/2023 CLINICAL DATA:  27 year old male with worsening right foot infection status post amputation. Seen this morning by wound care and redirected to the emergency department for possible debridement and IV antibiotics. Purulent drainage and pain to previous amputation site. Prior MRI 01/30/2023 without evidence of abscess. New suspected abscess. EXAM: MRI OF THE RIGHT FOREFOOT WITHOUT CONTRAST TECHNIQUE: Multiplanar, multisequence MR imaging of the right foot radiographs 02/13/2023 and 01/30/2023; was performed. No intravenous contrast was administered. COMPARISON:  Right foot radiographs 02/13/2023 and 10/04/2022; MRI right forefoot 01/30/2022 FINDINGS: Despite  efforts by the technologist and patient, mild-to-moderate motion artifact is present on today's exam and could not be eliminated. This reduces exam sensitivity and specificity. Bones/Joint/Cartilage Postsurgical changes are again seen of amputation of fifth digit to the mid shaft of the fifth metatarsal. Normal marrow signal is seen within the remaining fifth metatarsal. There is trace marrow edema at the proximal plantar aspect of the proximal phalanx of the fourth digit (sagittal series 8, image 17). In the region of the soft tissue swelling and sinus tracts at the distal lateral aspect of the forefoot described below,there is mild marrow edema within the plantar and lateral aspects of the fourth metatarsal head, however no significant cortical erosion is seen. Findings could represent non-infectious osteitis, however are highly concerning for osteomyelitis given the adjacent sinus tract. Ligaments The Lisfranc ligament complex is intact. The visualized collateral ligaments appear intact. Muscles and Tendons There is diffuse edema seen within the plantar greater than dorsal midfoot musculature, nonspecific myositis. Soft tissues There is new moderate edema and swelling within the soft tissues just plantar lateral to the 4th toe base of the proximal phalanx (axial series 5, images 34 through 39), and this is contiguous with soft tissue swelling and edema more proximally within the dorsal greater than plantar soft tissues at the level of the distal fourth metaphysis (axial series 5 images 40 through 49). Within the soft tissue swelling just plantar lateral to the fourth metatarsophalangeal joint there is a fluid bright sinus tract draining from the lateral aspect of the fourth metatarsal head to the ventral lateral skin surface (coronal series 7 images 19 through 21 and axial series 5 images 36 through 39. There is mild edema within the plantar and lateral aspects of the fourth metatarsal head, however no  significant cortical erosion is seen. There is an additional sinus tract that extends dorsally in the region just lateral and dorsal to the proximal aspect of the fourth metatarsal head and the distal shaft of the fourth metatarsal (axial series 5 images 45 through 49 and coronal series 7 images 11 through 14), draining to the skin surface. The deepest aspect of this fluid pools in a walled-off pocket of fluid that measures up to approximately 8 x 27 x 11 mm (transverse by long axis of the foot by short axis of the foot as measured on coronal image 13 and axial image 46) suspicious for an abscess. There are scattered foci of decreased T1 and decreased T2 signal air within the surrounding soft tissues. IMPRESSION: Compared to 01/30/2023: 1. Postsurgical changes are again seen of amputation of the fifth digit to the level of the mid  shaft of the metatarsal. 2. New soft tissue edema, swelling, and draining sinus tracts dorsal and lateral to the distal metatarsal shaft through the proximal phalanx of the fourth digit. This includes a walled-off pocket of fluid suspicious for an abscess measuring up to 27 mm along the long axis of the foot. There is marrow edema within the fourth metatarsal head and trace marrow edema within the proximal plantar aspect of the 4th proximal phalanx suspicious for acute osteomyelitis. Electronically Signed   By: Yvonne Kendall M.D.   On: 02/13/2023 19:51   DG Foot Complete Right  Result Date: 02/13/2023 CLINICAL DATA:  Status post fifth transmetatarsal amputation with worsening infection at the amputation site EXAM: RIGHT FOOT COMPLETE - 3 VIEW COMPARISON:  Right foot radiograph dated 10/04/2022, MR right foot dated 01/30/2023 FINDINGS: Postsurgical changes from fifth trans metatarsal amputation. There is no evidence of fracture or dislocation. There is no evidence of arthropathy or other focal bone abnormality. Soft tissue defect along the lateral forefoot at the amputation site. No  underlying cortical erosion to suggest osteomyelitis. IMPRESSION: Soft tissue defect along the lateral forefoot at the amputation site. No underlying cortical erosion to suggest osteomyelitis. Electronically Signed   By: Darrin Nipper M.D.   On: 02/13/2023 14:59    Review of Systems Blood pressure 129/78, pulse 91, temperature 98 F (36.7 C), temperature source Oral, resp. rate 18, height 6' 3"$  (1.905 m), weight 93.6 kg, SpO2 100 %. Physical Exam General: AAO x3, NAD  Dermatological: Edema erythema present along the fourth MPJ area and there is a sinus tract and there is purulence expressed.  Probes close to bone but not down to the bone today.  There is no fluctuation or crepitation.  Area that is marked as improved erythema.  Vascular: Dorsalis Pedis artery and Posterior Tibial artery pedal pulses are palpable bilateral with immedate capillary fill time.  There is no pain with calf compression, swelling, warmth, erythema.   Neruologic: Sensation absent  Musculoskeletal: Prior partial fifth ray amputation   Assessment/Plan: Osteomyelitis, abscess right foot  -I reviewed the MRI with the patient.  Concern for osteomyelitis/abscess is noted.  We discussed with conservative as well as surgical treatment options.  I do think he would benefit from surgery.  We discussed I&D, debridement.  I discussed possibility of fourth ray potation.  They are hesitant on this.  I discussed with him surgery tomorrow if clinically appears to be infection proceed with amputation versus bone biopsy.  They are in agreement to this for further evaluate tomorrow prior to surgery.  We discussed the surgery as well as the patient the alternatives risks and complications.  Patient agrees and his mom is also at bedside who agrees. -Continue antibiotics: Vancomycin, metronidazole, ceftriaxone -Weightbearing as tolerated in surgical shoe -Elevation -N.p.o. after midnight.  Surgery will be preformed by Dr. Altamese Cabal  tomorrow and patient/mom are aware.   Trula Slade 02/14/2023, 4:11 PM

## 2023-02-14 NOTE — Progress Notes (Signed)
PROGRESS NOTE    [name]  [id number] DOB: 07/22/1996 DOA: 02/13/2023 PCP: Danna Hefty, DO    Brief Narrative:   [name] is a 27 y.o. 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  On Tuesday, patient had a total contact cast placed on his right lower extremity.  Patient reports started having pain in the right foot roughly 2 days later.  He went to his wound care specialist who remove the cast with notable significant erythema, swelling and purulent discharge; and patient was directed to the ED for possible debridement and IV antibiotics.  In the ED, temperature 98.0 F, HR 86, RR 16, BP 134/84, SpO2 100% on room air.  WBC 14.8, hemoglobin 15.7, platelets 370.  Sodium 137, potassium 4.5, chloride 99, CO2 25, glucose 92, BUN 17, creatinine 0.82.  Lactic acid 1.0.  CRP 10.7, ESR 102.  Right foot x-ray with soft tissue defect lateral forefoot at the amputation site, no underlying cortical erosions/osteomyelitis.  MR right foot with soft tissue edema, swelling, draining sinus tracts dorsal/lateral to the distal metatarsal shaft through the proximal phalanx of the fourth digit, walled off pocket of fluid consistent with abscess measuring 27 mm, marrow edema within the fourth metatarsal head and trace marrow edema within the proximal plantar aspect of the fourth proximal phalanx suspicious for acute osteomyelitis.  Podiatry was consulted.  TRH consulted for admission for further evaluation management of left foot diabetic infection with abscess and acute osteomyelitis.  Assessment & Plan:   Left foot diabetic infection with osteomyelitis/abscess Patient presenting to ED from wound care clinic with increasing pain, swelling, purulent discharge from his left foot.  WBC count elevated 14.8, ESR 102, CRP  10.7.  Previously had fifth ray amputation by podiatry.  MR left foot with findings consistent with abscess, acute osteomyelitis. -- Podiatry consulted, plan operative management likely on 2/23 -- Blood cultures x 2: Pending -- Vancomycin, pharmacy consulted for dosing/monitoring -- Ceftriaxone 2 g IV every 24 hours -- Metronidazole 5 mg IV every 12 hours -- Norco 5-325 mg p.o. every 4 hours as needed moderate pain -- Morphine 1 mg IV every 4 hours as needed severe pain -- N.p.o. after midnight for planned surgical invention tomorrow -- CBC in a.m.  Diabetes mellitus type 1.5 Follows with endocrinology outpatient.  Home regimen includes Farxiga 10 mg p.o. daily, Lantus 26 units [place] BID.  Last hemoglobin A1c 8.4 on 11/14/2022, not optimally controlled. -- Semglee 10u [place] BID -- Novolog moderate SSI for coverage -- CBGs QAC/HS -- Update hemoglobin A1c  Essential hypertension -- Metoprolol succinate 100 mg PO daily  HLD -- Atorvastatin 10 mg p.o. daily  DVT prophylaxis: enoxaparin (LOVENOX) injection 40 mg Start: 02/13/23 2245    Code Status: Full Code Family Communication: No family present at bedside this morning  Disposition Plan:  Level of care: Med-Surg Status is: Inpatient Remains inpatient appropriate because: Pending surgical invention for acute osteomyelitis/abscess to left foot    Consultants:  Podiatry  Procedures:  None  Antimicrobials:  Vancomycin 2/21>> Ceftriaxone 2/21>> Metronidazole 2/21>>   Subjective: Patient seen examined bedside, resting calmly.  Sitting up in bed.  No specific complaints this morning.  Pain currently controlled.  Awaiting podiatry evaluation for projected need for surgical invention likely tomorrow.  Denies headache, no fever/chills/night sweats, no nausea/vomiting/diarrhea, no  chest pain, no palpitations, no shortness of breath, no abdominal pain, no focal weakness, no fatigue.  No acute events overnight per nursing  staff.  Objective: Vitals:   02/13/23 2223 02/14/23 0051 02/14/23 0600 02/14/23 0859  BP: 136/87 132/89 119/77 129/80  Pulse: (!) 104 95  93  Resp: 16 16 18 18  $ Temp: 98.5 F (36.9 C) 98.1 F (36.7 C) 98.2 F (36.8 C) 98.3 F (36.8 C)  TempSrc: Oral Oral Oral Oral  SpO2: 100% 100% 99% 100%  Weight: 93.6 kg     Height: 6' 3"$  (1.905 m)       Intake/Output Summary (Last 24 hours) at 02/14/2023 0945 Last data filed at 02/14/2023 0700 Gross per 24 hour  Intake 450 ml  Output 600 ml  Net -150 ml   Filed Weights   02/13/23 2000 02/13/23 2223  Weight: 85.7 kg 93.6 kg    Examination:  Physical Exam: GEN: NAD, alert and oriented x 3, wd/wn HEENT: NCAT, PERRL, EOMI, sclera clear, MMM PULM: CTAB w/o wheezes/crackles, normal respiratory effort, on room air CV: RRR w/o M/G/R GI: abd soft, NTND, NABS, no R/G/M MSK: Left foot with edema, erythema, purulent discharge NEURO: CN II-XII intact, no focal deficits, sensation to light touch intact PSYCH: normal mood/affect Integumentary: Left foot as described above and depicted below, otherwise no other concerning rashes/lesions/wounds noted on exposed skin surfaces         Data Reviewed: I have personally reviewed following labs and imaging studies  CBC: Recent Labs  Lab 02/13/23 1422 02/14/23 0400  WBC 14.8* 14.0*  NEUTROABS  --  8.9*  HGB 15.7 14.8  HCT 48.1 43.1  MCV 88.6 86.5  PLT 370 0000000   Basic Metabolic Panel: Recent Labs  Lab 02/13/23 1422 02/14/23 0400  NA 137 137  K 4.5 3.8  CL 99 101  CO2 25 27  GLUCOSE 92 63*  BUN 17 16  CREATININE 0.82 0.81  CALCIUM 10.0 9.4  MG  --  2.2   GFR: Estimated Creatinine Clearance: 166.6 mL/min (by C-G formula based on SCr of 0.81 mg/dL). Liver Function Tests: No results for input(s): "AST", "ALT", "ALKPHOS", "BILITOT", "PROT", "ALBUMIN" in the last 168 hours. No results for input(s): "LIPASE", "AMYLASE" in the last 168 hours. No results for input(s): "AMMONIA" in  the last 168 hours. Coagulation Profile: No results for input(s): "INR", "PROTIME" in the last 168 hours. Cardiac Enzymes: No results for input(s): "CKTOTAL", "CKMB", "CKMBINDEX", "TROPONINI" in the last 168 hours. BNP (last 3 results) No results for input(s): "PROBNP" in the last 8760 hours. HbA1C: No results for input(s): "HGBA1C" in the last 72 hours. CBG: Recent Labs  Lab 02/13/23 2225 02/14/23 0901  GLUCAP 80 132*   Lipid Profile: No results for input(s): "CHOL", "HDL", "LDLCALC", "TRIG", "CHOLHDL", "LDLDIRECT" in the last 72 hours. Thyroid Function Tests: No results for input(s): "TSH", "T4TOTAL", "FREET4", "T3FREE", "THYROIDAB" in the last 72 hours. Anemia Panel: No results for input(s): "VITAMINB12", "FOLATE", "FERRITIN", "TIBC", "IRON", "RETICCTPCT" in the last 72 hours. Sepsis Labs: Recent Labs  Lab 02/13/23 1832  LATICACIDVEN 1.0    Recent Results (from the past 240 hour(s))  Aerobic Culture w Gram Stain (superficial specimen)     Status: None (Preliminary result)   Collection Time: 02/13/23 12:30 PM   Specimen: Wound  Result Value Ref Range Status   Specimen Description   Final    WOUND RIGHT FOOT Performed at [facility] [address], [place], [place] [address]  Special Requests   Final    NONE Performed at [facility], [place] [address], [place], [place] [address]    Gram Stain   Final    RARE WBC PRESENT, PREDOMINANTLY PMN MODERATE GRAM POSITIVE COCCI    Culture   Final    TOO YOUNG TO READ Performed at [facility], [place] [address] [place], [place] [address]    Report Status PENDING  Incomplete  Blood culture (routine x 2)     Status: None (Preliminary result)   Collection Time: 02/13/23  6:32 PM   Specimen: BLOOD LEFT HAND  Result Value Ref Range Status   Specimen Description BLOOD LEFT HAND  Final   Special Requests   Final    BOTTLES DRAWN AEROBIC AND ANAEROBIC Blood Culture results may not be  optimal due to an excessive volume of blood received in culture bottles   Culture   Final    NO GROWTH < 12 HOURS Performed at [facility], [place] [address], [place], [place] [address]    Report Status PENDING  Incomplete  Blood culture (routine x 2)     Status: None (Preliminary result)   Collection Time: 02/13/23  6:32 PM   Specimen: BLOOD RIGHT WRIST  Result Value Ref Range Status   Specimen Description BLOOD RIGHT WRIST  Final   Special Requests   Final    BOTTLES DRAWN AEROBIC AND ANAEROBIC Blood Culture results may not be optimal due to an inadequate volume of blood received in culture bottles   Culture   Final    NO GROWTH < 12 HOURS Performed at [facility], [place] [address] [place], [place] [address]    Report Status PENDING  Incomplete         Radiology Studies: MR FOOT RIGHT WO CONTRAST  Result Date: 02/13/2023 CLINICAL DATA:  27 year old male with worsening right foot infection status post amputation. Seen this morning by wound care and redirected to the emergency department for possible debridement and IV antibiotics. Purulent drainage and pain to previous amputation site. Prior MRI 01/30/2023 without evidence of abscess. New suspected abscess. EXAM: MRI OF THE RIGHT FOREFOOT WITHOUT CONTRAST TECHNIQUE: Multiplanar, multisequence MR imaging of the right foot radiographs 02/13/2023 and 01/30/2023; was performed. No intravenous contrast was administered. COMPARISON:  Right foot radiographs 02/13/2023 and 10/04/2022; MRI right forefoot 01/30/2022 FINDINGS: Despite efforts by the technologist and patient, mild-to-moderate motion artifact is present on today's exam and could not be eliminated. This reduces exam sensitivity and specificity. Bones/Joint/Cartilage Postsurgical changes are again seen of amputation of fifth digit to the mid shaft of the fifth metatarsal. Normal marrow signal is seen within the remaining fifth metatarsal. There is trace marrow edema at the  proximal plantar aspect of the proximal phalanx of the fourth digit (sagittal series 8, image 17). In the region of the soft tissue swelling and sinus tracts at the distal lateral aspect of the forefoot described below,there is mild marrow edema within the plantar and lateral aspects of the fourth metatarsal head, however no significant cortical erosion is seen. Findings could represent non-infectious osteitis, however are highly concerning for osteomyelitis given the adjacent sinus tract. Ligaments The Lisfranc ligament complex is intact. The visualized collateral ligaments appear intact. Muscles and Tendons There is diffuse edema seen within the plantar greater than dorsal midfoot musculature, nonspecific myositis. Soft tissues There is new moderate edema and swelling within the soft tissues just plantar lateral to the 4th toe  base of the proximal phalanx (axial series 5, images 34 through 39), and this is contiguous with soft tissue swelling and edema more proximally within the dorsal greater than plantar soft tissues at the level of the distal fourth metaphysis (axial series 5 images 40 through 49). Within the soft tissue swelling just plantar lateral to the fourth metatarsophalangeal joint there is a fluid bright sinus tract draining from the lateral aspect of the fourth metatarsal head to the ventral lateral skin surface (coronal series 7 images 19 through 21 and axial series 5 images 36 through 39. There is mild edema within the plantar and lateral aspects of the fourth metatarsal head, however no significant cortical erosion is seen. There is an additional sinus tract that extends dorsally in the region just lateral and dorsal to the proximal aspect of the fourth metatarsal head and the distal shaft of the fourth metatarsal (axial series 5 images 45 through 49 and coronal series 7 images 11 through 14), draining to the skin surface. The deepest aspect of this fluid pools in a walled-off pocket of fluid that  measures up to approximately 8 x 27 x 11 mm (transverse by long axis of the foot by short axis of the foot as measured on coronal image 13 and axial image 46) suspicious for an abscess. There are scattered foci of decreased T1 and decreased T2 signal air within the surrounding soft tissues. IMPRESSION: Compared to 01/30/2023: 1. Postsurgical changes are again seen of amputation of the fifth digit to the level of the mid shaft of the metatarsal. 2. New soft tissue edema, swelling, and draining sinus tracts dorsal and lateral to the distal metatarsal shaft through the proximal phalanx of the fourth digit. This includes a walled-off pocket of fluid suspicious for an abscess measuring up to 27 mm along the long axis of the foot. There is marrow edema within the fourth metatarsal head and trace marrow edema within the proximal plantar aspect of the 4th proximal phalanx suspicious for acute osteomyelitis. Electronically Signed   By: Yvonne Kendall M.D.   On: 02/13/2023 19:51   DG Foot Complete Right  Result Date: 02/13/2023 CLINICAL DATA:  Status post fifth transmetatarsal amputation with worsening infection at the amputation site EXAM: RIGHT FOOT COMPLETE - 3 VIEW COMPARISON:  Right foot radiograph dated 10/04/2022, MR right foot dated 01/30/2023 FINDINGS: Postsurgical changes from fifth trans metatarsal amputation. There is no evidence of fracture or dislocation. There is no evidence of arthropathy or other focal bone abnormality. Soft tissue defect along the lateral forefoot at the amputation site. No underlying cortical erosion to suggest osteomyelitis. IMPRESSION: Soft tissue defect along the lateral forefoot at the amputation site. No underlying cortical erosion to suggest osteomyelitis. Electronically Signed   By: Darrin Nipper M.D.   On: 02/13/2023 14:59        Scheduled Meds:  atorvastatin  10 mg Oral Daily   busPIRone  5 mg Oral BID   DULoxetine  30 mg Oral Daily   enoxaparin (LOVENOX) injection  40 mg  Subcutaneous QHS   insulin aspart  0-15 Units Subcutaneous TID WC   insulin aspart  0-5 Units Subcutaneous QHS   insulin glargine-yfgn  10 Units Subcutaneous BID   metoprolol succinate  100 mg Oral QHS   zolpidem  10 mg Oral QHS   Continuous Infusions:  cefTRIAXone (ROCEPHIN)  IV Stopped (02/13/23 2055)   metronidazole 500 mg (02/14/23 0919)   vancomycin Stopped (02/14/23 0034)     LOS: 1 day  Time spent: 52 minutes spent on chart review, discussion with nursing staff, consultants, updating family and interview/physical exam; more than 50% of that time was spent in counseling and/or coordination of care.    Yides Saidi J [place] DO [facility] Available via Epic secure chat 7am-7pm After these hours, please refer to coverage provider listed on [email or web address] 02/14/2023, 9:45 AM

## 2023-02-14 NOTE — Progress Notes (Signed)
MARLA, LOADHOLT ([id number]) [id number] Page 1 of 7 Visit Report for 02/13/2023 Chief Complaint Document Details Patient Name: Date of Service: [name], [name] 02/13/2023 12:30 PM Medical Record Number: [id number] Patient Account Number: [id number] Date of Birth/Sex: Treating RN: 1996-07-31 (27 y.o. M) Primary Care Provider: Mina Marble Other Clinician: Referring Provider: Treating Provider/Extender: Delia Heady, Kiersten Weeks in Treatment: 6 Information Obtained from: Patient Chief Complaint 12/27/2022; bilateral diabetic foot wounds Electronic Signature(s) Signed: 02/13/2023 1:06:33 PM By: Worthy Keeler PA-C Entered By: Worthy Keeler on 02/13/2023 13:06:33 -------------------------------------------------------------------------------- HPI Details Patient Name: Date of Service: [name], [name] 02/13/2023 12:30 PM Medical Record Number: [id number] Patient Account Number: [id number] Date of Birth/Sex: Treating RN: Mar 07, 1996 (27 y.o. M) Primary Care Provider: Mina Marble Other Clinician: Referring Provider: Treating Provider/Extender: Maureen Ralphs Weeks in Treatment: 6 History of Present Illness HPI Description: 12/27/2022 Mr. 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 He has been following with podiatry for this issue. He reports using Silvadene to the areas. Currently not using anything for offloading. He states that the right foot wound has been present for the past 1-1/2 years. He states that the left foot wound appeared about 3 months ago. He has not had recent imaging of the areas. Currently denies systemic signs of infection. 01/03/2023; patient presents for follow-up. He has been using Medihoney and  silver alginate to the wound beds. He has been using his surgical shoes with peg assist. He has not been scheduled for an MRI yet. There is been improvement in wound healing. He currently denies signs of infection. 1/18; patient presents for follow-up. He is scheduled to have his MRI on 1/29. He has been using silver alginate with Medihoney to the wound beds. He has been using his surgical shoes with peg assist. He has no issues or complaints today. He denies signs of infection. 1/25; patient presents for follow-up. He has been using silver alginate with Medihoney to the wound beds. He is starting to develop a new wound to the right lateral foot adjacent to the original right foot wound. He has been using his surgical shoes with peg assist. He currently denies signs of infection. Scheduled for MRI in 4 days. 2/1; patient presents for follow-up. Has been using silver alginate with Medihoney to the wound beds. Unfortunately his insurance denied his MRI a day prior to when it was scheduled. This was reapproved but only to the right foot with a prior Auth. He currently denies signs of infection. 2/8; patient presents for follow-up. He has been using silver alginate and Medihoney to the wound beds. He has no issues or complaints today. He obtained his MRI of the right foot and x-ray of the right foot yesterday. Read at time patient was seen had not resulted. 2/13; patient presents for follow-up. His right foot MRI showed no evidence of osteomyelitis. Plan is for the total contact cast and patient was agreeable to this. He has been using silver alginate and Medihoney to the wound beds. He denies signs of infection. 2/15; patient presents for obligatory cast change. He tolerated he tolerated the total contact cast well. He has no issues or complaints today. He continues to use silver alginate and Medihoney to the left foot wound. 02-13-2023 upon evaluation today patient appears to be doing poorly  unfortunately. He actually did have a cast that was put on initially last week on Tuesday and then he had the first cast change on Thursday. There was no issue from one to the other during those cast changes. Subsequently however he tells me that 2 days later on Saturday he began to have discomfort he is neuropathic and generally does not have a lot of pain. This was unusual but he thought it was [name], [name] ([id number]) [phone].pdf Page 2 of 7 probably just him not wanting to have the cast on is it something that really has been somewhat cumbersome. Nonetheless he ended up calling us yesterday afternoon in order to advise that it was still hurting and bothering him. We attempted to get him to come in yesterday to see Dr. Heber [place] but unfortunately he tells me that he was unable to get in as he did not have a ride. Therefore he was placed on the schedule for today for [place] to recommend to be seen. Nonetheless I do believe what happened here is that he has developed an abscess he does have an MRI which was completed on January 31, 2023 that was negative for any osteomyelitis. There was also negative for abscess. That obviously is changed he has a fairly significant abscess over the amputation site of the lateral portion of his right foot. With that being said there is definitive purulent drainage noted there is also multiple openings whereas before it was 1 plantar area he now has areas that are open at multiple locations. This all connects and intertwine's internally. I believe that he is probably going to be taken to the OR for debridement to clear this away and podiatry will be consulted. Subsequently also think he needs IV antibiotics ASAP and I discussed this with him today as well as his mother as well. Electronic Signature(s) Signed: 02/13/2023 1:36:50 PM By: Worthy Keeler PA-C Entered By: Worthy Keeler on 02/13/2023  13:36:50 -------------------------------------------------------------------------------- Physical Exam Details Patient Name: Date of Service: [name], [name] 02/13/2023 12:30 PM Medical Record Number: [id number] Patient Account Number: [id number] Date of Birth/Sex: Treating RN: 21-Mar-1996 (27 y.o. M) Primary Care Provider: Mina Marble Other Clinician: Referring Provider: Treating Provider/Extender: Delia Heady, Kiersten Weeks in Treatment: 6 Constitutional Well-nourished and well-hydrated in no acute distress. Respiratory normal breathing without difficulty. Psychiatric this patient is able to make decisions and demonstrates good insight into disease process. Alert and Oriented x 3. pleasant and cooperative. Notes Upon inspection patient's wound bed again is showing signs of being significantly worse than before I did examine the cast as well there was nothing that was rubbing in regard to the cast I do not believe this had anything to do with it other than the fact that the cast was on and he could not see what was going on underneath. Obviously with the pain and suing that should have been something that he let us know immediately about actually going to the ER in fact over the weekend to get the cast removed. With that being said he thought it which is something he was annoyed with the cast about and that it was not really that big a deal. Either way that has led to what we are seeing currently. Otherwise though I see no areas that were rubbing the area was padded well is just a matter of I believe an abscess ensuing and again it could have happened and likely would have even if he did not have the cast  on it just happened to be under the cast he could not see what was going on until we removed it today. Electronic Signature(s) Signed: 02/13/2023 1:37:45 PM By: Worthy Keeler PA-C Entered By: Worthy Keeler on 02/13/2023  13:37:45 -------------------------------------------------------------------------------- Physician Orders Details Patient Name: Date of Service: [name], [name] 02/13/2023 12:30 PM Medical Record Number: [id number] Patient Account Number: [id number] Date of Birth/Sex: Treating RN: Apr 19, 1996 (27 y.o. [place] Noe Primary Care Provider: Mina Marble Other Clinician: Referring Provider: Treating Provider/Extender: Delia Heady, Kiersten Weeks in Treatment: 6 Verbal / Phone Orders: No Diagnosis Coding ICD-10 Coding Code Description L97.518 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ity [name], [name] ([id number]) [id number] Page 3 of 7 L97.528 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ty E10.40 Type 1 diabetes mellitus with diabetic neuropathy, unspecified E10.621 Type 1 diabetes mellitus with foot ulcer Follow-up Appointments Other: - SEnd to ED ASAP. Per pt. and his mom they will go to [facility] after they leave here. Call us after you leave the hospital ASAP Anesthetic (In clinic) Topical Lidocaine 4% applied to wound bed Bathing/ Shower/ Hygiene May shower with protection but do not get wound dressing(s) wet. Protect dressing(s) with water repellant cover (for example, large plastic bag) or a cast cover and may then take shower. Off-Loading Open toe surgical shoe to: - surgical shoes with PEG ASSIST to left shoes. Wear while walking and standing. DO not walk barefoot or just in socks. Additional Orders / Instructions Follow Nutritious Diet - CLOSELY MONITOR BLOOD SUGAR. Wound Treatment Wound #1 - Metatarsal head fifth Wound Laterality: Right Cleanser: Wound Cleanser (Generic) 1 x Per Day/30 Days Discharge Instructions: Cleanse the wound with wound cleanser prior to applying a clean dressing using gauze sponges, not tissue or cotton  balls. Peri-Wound Care: Skin Prep (Generic) 1 x Per Day/30 Days Discharge Instructions: Use skin prep as directed Topical: Gentamicin 1 x Per Day/30 Days Discharge Instructions: As directed by physician Topical: Mupirocin Ointment 1 x Per Day/30 Days Discharge Instructions: Apply Mupirocin (Bactroban) as instructed Prim Dressing: Hydrofera Blue Ready Transfer Foam, 2.5x2.5 (in/in) 1 x Per Day/30 Days ary Discharge Instructions: Apply directly to wound bed as directed Secondary Dressing: Optifoam Non-Adhesive Dressing, 4x4 in (Generic) 1 x Per Day/30 Days Discharge Instructions: Apply ONE LAYER OF FOAM D Secondary Dressing: Woven Gauze Sponges 2x2 in (Generic) 1 x Per Day/30 Days Discharge Instructions: Apply over primary dressing as directed. Secured With: Child psychotherapist, Sterile 2x75 (in/in) (Generic) 1 x Per Day/30 Days Discharge Instructions: Secure with stretch gauze as directed. Secured With: 53M Medipore H Soft Cloth Surgical T ape, 4 x 10 (in/yd) (Generic) 1 x Per Day/30 Days Discharge Instructions: Secure with tape as directed. Wound #2 - Metatarsal head fifth Wound Laterality: Left Cleanser: Wound Cleanser (Generic) 1 x Per Day/30 Days Discharge Instructions: Cleanse the wound with wound cleanser prior to applying a clean dressing using gauze sponges, not tissue or cotton balls. Peri-Wound Care: Skin Prep (Generic) 1 x Per Day/30 Days Discharge Instructions: Use skin prep as directed Prim Dressing: MediHoney Gel, tube 1.5 (oz) 1 x Per Day/30 Days ary Discharge Instructions: Apply to wound bed as instructed Prim Dressing: Sorbalgon AG Dressing 2x2 (in/in) (Generic) 1 x Per Day/30 Days ary Discharge Instructions: apply to wound bed. Secondary Dressing: Optifoam Non-Adhesive Dressing, 4x4 in (Generic) 1 x Per Day/30 Days Discharge Instructions: Apply ONE LAYER OF FOAM D Secondary  Dressing: Woven Gauze Sponges 2x2 in (Generic) 1 x Per Day/30 Days Discharge  Instructions: Apply over primary dressing as directed. Secured With: Child psychotherapist, Sterile 2x75 (in/in) (Generic) 1 x Per Day/30 Days Discharge Instructions: Secure with stretch gauze as directed. Secured With: 26M Medipore H Soft Cloth Surgical T ape, 4 x 10 (in/yd) (Generic) 1 x Per Day/30 Days Discharge Instructions: Secure with tape as directed. [name], [name] ([id number]) [id number] Page 4 of 7 Electronic Signature(s) Signed: 02/13/2023 5:24:38 PM By: Worthy Keeler PA-C Entered By: Rhae Hammock on 02/13/2023 13:48:25 -------------------------------------------------------------------------------- Problem List Details Patient Name: Date of Service: ZYKEEM, IFFT 02/13/2023 12:30 PM Medical Record Number: [id number] Patient Account Number: [id number] Date of Birth/Sex: Treating RN: 01-05-1996 (27 y.o. M) Primary Care Provider: Mina Marble Other Clinician: Referring Provider: Treating Provider/Extender: Maureen Ralphs Weeks in Treatment: 6 Active Problems ICD-10 Encounter Code Description Active Date MDM Diagnosis L97.518 Non-pressure chronic ulcer of other part of right foot with other specified 12/27/2022 No Yes severity L97.528 Non-pressure chronic ulcer of other part of left foot with other specified 12/27/2022 No Yes severity E10.40 Type 1 diabetes mellitus with diabetic neuropathy, unspecified 12/27/2022 No Yes E10.621 Type 1 diabetes mellitus with foot ulcer 12/27/2022 No Yes Inactive Problems Resolved Problems Electronic Signature(s) Signed: 02/13/2023 1:06:22 PM By: Worthy Keeler PA-C Entered By: Worthy Keeler on 02/13/2023 13:06:22 -------------------------------------------------------------------------------- Progress Note Details Patient Name: Date of Service: [name], [name] 02/13/2023 12:30 PM Medical Record Number: [id number] Patient Account Number: [id number] Date of Birth/Sex:  Treating RN: 09-Nov-1996 (27 y.o. M) Primary Care Provider: Mina Marble Other Clinician: Referring Provider: Treating Provider/Extender: Delia Heady, [name] in Treatment: 6 Subjective Chief Complaint [name], [name] ([id number]) [id number] Page 5 of 7 Information obtained from Patient 12/27/2022; bilateral diabetic foot wounds History of Present Illness (HPI) 12/27/2022 Mr. 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 He has been following with podiatry for this issue. He reports using Silvadene to the areas. Currently not using anything for offloading. He states that the right foot wound has been present for the past 1-1/2 years. He states that the left foot wound appeared about 3 months ago. He has not had recent imaging of the areas. Currently denies systemic signs of infection. 01/03/2023; patient presents for follow-up. He has been using Medihoney and silver alginate to the wound beds. He has been using his surgical shoes with peg assist. He has not been scheduled for an MRI yet. There is been improvement in wound healing. He currently denies signs of infection. 1/18; patient presents for follow-up. He is scheduled to have his MRI on 1/29. He has been using silver alginate with Medihoney to the wound beds. He has been using his surgical shoes with peg assist. He has no issues or complaints today. He denies signs of infection. 1/25; patient presents for follow-up. He has been using silver alginate with Medihoney to the wound beds. He is starting to develop a new wound to the right lateral foot adjacent to the original right foot wound. He has been using his surgical shoes with peg assist. He currently denies signs of infection. Scheduled  for MRI in 4 days. 2/1; patient presents for follow-up. Has been using silver alginate with Medihoney to the wound beds. Unfortunately his insurance denied  his MRI a day prior to when it was scheduled. This was reapproved but only to the right foot with a prior Auth. He currently denies signs of infection. 2/8; patient presents for follow-up. He has been using silver alginate and Medihoney to the wound beds. He has no issues or complaints today. He obtained his MRI of the right foot and x-ray of the right foot yesterday. Read at time patient was seen had not resulted. 2/13; patient presents for follow-up. His right foot MRI showed no evidence of osteomyelitis. Plan is for the total contact cast and patient was agreeable to this. He has been using silver alginate and Medihoney to the wound beds. He denies signs of infection. 2/15; patient presents for obligatory cast change. He tolerated he tolerated the total contact cast well. He has no issues or complaints today. He continues to use silver alginate and Medihoney to the left foot wound. 02-13-2023 upon evaluation today patient appears to be doing poorly unfortunately. He actually did have a cast that was put on initially last week on Tuesday and then he had the first cast change on Thursday. There was no issue from one to the other during those cast changes. Subsequently however he tells me that 2 days later on Saturday he began to have discomfort he is neuropathic and generally does not have a lot of pain. This was unusual but he thought it was probably just him not wanting to have the cast on is it something that really has been somewhat cumbersome. Nonetheless he ended up calling us yesterday afternoon in order to advise that it was still hurting and bothering him. We attempted to get him to come in yesterday to see Dr. Heber [place] but unfortunately he tells me that he was unable to get in as he did not have a ride. Therefore he was placed on the  schedule for today for [place] to recommend to be seen. Nonetheless I do believe what happened here is that he has developed an abscess he does have an MRI which was completed on January 31, 2023 that was negative for any osteomyelitis. There was also negative for abscess. That obviously is changed he has a fairly significant abscess over the amputation site of the lateral portion of his right foot. With that being said there is definitive purulent drainage noted there is also multiple openings whereas before it was 1 plantar area he now has areas that are open at multiple locations. This all connects and intertwine's internally. I believe that he is probably going to be taken to the OR for debridement to clear this away and podiatry will be consulted. Subsequently also think he needs IV antibiotics ASAP and I discussed this with him today as well as his mother as well. Objective Constitutional Well-nourished and well-hydrated in no acute distress. Vitals Time Taken: 12:47 PM, Height: 76 in, Weight: 189 lbs, BMI: 23, Temperature: 98.4 F, Pulse: 94 bpm, Respiratory Rate: 17 breaths/min, Blood Pressure: 120/86 mmHg, Capillary Blood Glucose: 126 mg/dl. Respiratory normal breathing without difficulty. Psychiatric this patient is able to make decisions and demonstrates good insight into disease process. Alert and Oriented x 3. pleasant and cooperative. General Notes: Upon inspection patient's wound bed again is showing signs of being significantly worse than before I did examine the cast as well there was nothing that was rubbing in regard to the cast I do not believe this had anything to do with it other than the fact that the cast was on and he  could not see what was going on underneath. Obviously with the pain and suing that should have been something that he let us know immediately about actually going to the ER in fact over the weekend to get the cast removed. With that being said he thought it which  is something he was annoyed with the cast about and that it was not really that big a deal. Either way that has led to what we are seeing currently. Otherwise though I see no areas that were rubbing the area was padded well is just a matter of I believe an abscess ensuing and again it could have happened and likely would have even if he did not have the cast on it just happened to be under the cast he could not see what was going on until we removed it today. Integumentary (Hair, Skin) Wound #1 status is Open. Original cause of wound was Gradually Appeared. The date acquired was: 12/25/2019. The wound has been in treatment 6 weeks. The wound is located on the Right Metatarsal head fifth. The wound measures 7cm length x 7cm width x 2.5cm depth; 38.485cm^2 area and 96.211cm^3 volume. There is Fat Layer (Subcutaneous Tissue) exposed. Tunneling has been noted at 11:00 with a maximum distance of 2.5cm. There is additional tunneling at 10:00 with a maximum distance of 2.5cm, and at 9:00 with a maximum distance of 2.5cm. Undermining begins at 12:00 and ends at 12:00 with a maximum distance of 3cm. There is a large amount of purulent drainage noted. The wound margin is distinct with the outline attached to the wound base. There is small (1-33%) red, pink granulation within the wound bed. There is a large (67-100%) amount of necrotic tissue within the wound bed including Adherent Slough. The periwound skin appearance exhibited: Callus, Excoriation, Maceration, Erythema. The periwound skin appearance did not exhibit: Crepitus, Induration, Rash, Scarring, Dry/Scaly, Atrophie Blanche, Cyanosis, Ecchymosis, Hemosiderin Staining, Mottled, Pallor, Rubor. The surrounding wound skin color is noted with erythema which is circumferential. Periwound temperature was noted as No Abnormality. The periwound has tenderness on palpation. Wound #2 status is Open. Original cause of wound was Gradually Appeared. The date acquired  was: 09/24/2022. The wound has been in treatment 6 weeks. The wound is located on the Left Metatarsal head fifth. The wound measures 0.3cm length x 0.5cm width x 0.2cm depth; 0.118cm^2 area and 0.024cm^3 volume. [name], [name] ([id number]) [id number] Page 6 of 7 There is a medium amount of serosanguineous drainage noted. 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 Protect dressing(s) with water repellant cover (for example, large plastic bag) or a cast cover and may then take shower. Off-Loading: T Contact Cast to Right Lower Extremity - wear black boot with cast at all times otal Open toe surgical shoe to: - surgical shoes with PEG ASSIST to left shoes. Wear while walking and standing. DO not walk barefoot or just in socks. Additional Orders / Instructions: Follow Nutritious Diet - CLOSELY MONITOR BLOOD SUGAR. WOUND #1: - Metatarsal head fifth Wound Laterality: Right Cleanser: Wound Cleanser (Generic) 1 x  Per Day/30 Days Discharge Instructions: Cleanse the wound with wound cleanser prior to applying a clean dressing using gauze sponges, not tissue or cotton balls. Peri-Wound Care: Skin Prep (Generic) 1 x Per Day/30 Days Discharge Instructions: Use skin prep as directed Topical: Gentamicin 1 x Per Day/30 Days Discharge Instructions: As directed by physician Topical: Mupirocin Ointment 1 x Per Day/30 Days Discharge Instructions: Apply Mupirocin (Bactroban) as  instructed Prim Dressing: Hydrofera Blue Ready Transfer Foam, 2.5x2.5 (in/in) 1 x Per Day/30 Days ary Discharge Instructions: Apply directly to wound bed as directed Secondary Dressing: Optifoam Non-Adhesive Dressing, 4x4 in (Generic) 1 x Per Day/30 Days Discharge Instructions: Apply ONE LAYER OF FOAM D Secondary Dressing: Woven Gauze Sponges 2x2 in (Generic) 1 x Per Day/30 Days Discharge Instructions: Apply over primary dressing as directed. Secured With: Child psychotherapist, Sterile 2x75 (in/in) (Generic) 1 x Per Day/30 Days Discharge Instructions: Secure with stretch gauze as directed. Secured With: 31M Medipore H Soft Cloth Surgical T ape, 4 x 10 (in/yd) (Generic) 1 x Per Day/30 Days Discharge Instructions: Secure with tape as directed. WOUND #2: - Metatarsal head fifth Wound Laterality: Left Cleanser: Wound Cleanser (Generic) 1 x Per Day/30 Days Discharge Instructions: Cleanse the wound with wound cleanser prior to applying a clean dressing using gauze sponges, not tissue or cotton balls. Peri-Wound Care: Skin Prep (Generic) 1 x Per Day/30 Days Discharge Instructions: Use skin prep as directed Prim Dressing: MediHoney Gel, tube 1.5 (oz) 1 x Per Day/30 Days ary Discharge Instructions: Apply to wound bed as instructed Prim Dressing: Sorbalgon AG Dressing 2x2 (in/in) (Generic) 1 x Per Day/30 Days ary Discharge Instructions: apply to wound bed. Secondary Dressing: Optifoam Non-Adhesive Dressing, 4x4 in (Generic) 1 x Per Day/30 Days Discharge Instructions: Apply ONE LAYER OF FOAM D Secondary Dressing: Woven Gauze Sponges 2x2 in (Generic) 1 x Per Day/30 Days Discharge Instructions: Apply over primary dressing as directed. Secured With: Child psychotherapist, Sterile 2x75 (in/in) (Generic) 1 x Per Day/30 Days Discharge Instructions: Secure with stretch gauze as directed. Secured With: 31M Medipore H Soft Cloth Surgical T ape, 4 x 10 (in/yd) (Generic) 1 x Per Day/30  Days Discharge Instructions: Secure with tape as directed. 1. I would recommend currently based on what we are seeing that the patient needs to go to the ER ASAP for further evaluation and treatment. I discussed with him that this is unfortunately something that is going to require IV antibiotics and likely an OR debridement to clean this out as well he voiced understanding. 2. I am also can recommend that we have the patient have an ABD pad and roll gauze to secure in place to get him to the ER. 3. With regard to culture I did go ahead and grab 1 today just in case. Obviously if he has this done in the OR in the culture anything deeper and that can supersede what we did today but I wanted to make sure we at least had something as far as treatment is concerned going forward to be able to identify what is most likely what we can do to try to help things out here. We will see patient back for reevaluation in 1 week here in the clinic. If anything worsens or changes patient will contact our office for additional recommendations. Electronic Signature(s) Signed: 02/13/2023 1:38:36 PM By: Bryson Ha, Harriette Ohara ([id number]) [id number] Page 7 of 7 Entered By: Worthy Keeler on 02/13/2023 13:38:35 -------------------------------------------------------------------------------- SuperBill Details Patient Name: Date  of Service: [name], LAMIRANDE 02/13/2023 Medical Record Number: [id number] Patient Account Number: [id number] Date of Birth/Sex: Treating RN: 01/12/1996 (27 y.o. M) Primary Care Provider: Mina Marble Other Clinician: Referring Provider: Treating Provider/Extender: Maureen Ralphs Weeks in Treatment: 6 Diagnosis Coding ICD-10 Codes Code Description [phone] 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ity L97.528 Non-pressure chronic ulcer of other part of left foot with other specified  severity E10.40 Type 1 diabetes mellitus with diabetic neuropathy, unspecified E10.621 Type 1 diabetes mellitus with foot ulcer Facility Procedures : CPT4 Code: [id number] Description: 99214 - WOUND CARE VISIT-LEV 4 EST PT Modifier: Quantity: 1 Physician Procedures : CPT4 Code Description Modifier [id number] - WC PHYS LEVEL 5 - EST PT ICD-10 Diagnosis Description L97.518 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ity L97.528 Non-pressure chronic ulcer of other part of left  foot with other specified severity E10.40 Type 1 diabetes mellitus with diabetic neuropathy, unspecified E10.621 Type 1 diabetes mellitus with foot ulcer Quantity: 1 Electronic Signature(s) Signed: 02/13/2023 5:24:38 PM By: Worthy Keeler PA-C Previous Signature: 02/13/2023 1:39:03 PM Version By: Worthy Keeler PA-C Entered By: Rhae Hammock on 02/13/2023 13:50:54

## 2023-02-14 NOTE — Inpatient Diabetes Management (Signed)
Inpatient Diabetes Program Recommendations  AACE/ADA: New Consensus Statement on Inpatient Glycemic Control (2015)  Target Ranges:  Prepandial:   less than 140 mg/dL      Peak postprandial:   less than 180 mg/dL (1-2 hours)      Critically ill patients:  140 - 180 mg/dL   Lab Results  Component Value Date   GLUCAP 114 (H) 02/14/2023   HGBA1C 8.4 (A) 11/14/2022    Review of Glycemic Control  Diabetes history: DM 1.5 Outpatient Diabetes medications: Lantus 26 units BID, Farxiga 10 QD, Trulicity weekly Current orders for Inpatient glycemic control: Semglee 10 BID, Novolog 0-15 units TID and 0-5 units QHS  Spoke with patient at bedside to clarify if he takes Trulicity or Ozempic as both are listed on the med rec.  He is taking Trulicity now because Ozempic has been on back order.  He is current with endocrinologist, Dr. Cruzita Lederer.  He has an appointment in March.  He lives with his mom and sister.  He checks his BG several times a day.  Denies difficulties obtaining medications.    Will continue to follow while inpatient.  Thank you, Reche Dixon, MSN, [place] [facility] Coordinator Inpatient Diabetes Program [phone] (team pager from 8a-5p)

## 2023-02-15 ENCOUNTER — Inpatient Hospital Stay (HOSPITAL_COMMUNITY): Payer: BC Managed Care – PPO | Admitting: Anesthesiology

## 2023-02-15 ENCOUNTER — Other Ambulatory Visit: Payer: Self-pay

## 2023-02-15 ENCOUNTER — Encounter (HOSPITAL_COMMUNITY): Payer: Self-pay | Admitting: Family Medicine

## 2023-02-15 ENCOUNTER — Encounter (HOSPITAL_COMMUNITY)
Admission: EM | Disposition: A | Discharge status: Home Health | Payer: Self-pay | Source: Home / Self Care | Attending: Internal Medicine

## 2023-02-15 ENCOUNTER — Inpatient Hospital Stay (HOSPITAL_COMMUNITY): Payer: BC Managed Care – PPO

## 2023-02-15 DIAGNOSIS — M86171 Other acute osteomyelitis, right ankle and foot: Secondary | ICD-10-CM | POA: Diagnosis not present

## 2023-02-15 DIAGNOSIS — L02611 Cutaneous abscess of right foot: Secondary | ICD-10-CM | POA: Diagnosis not present

## 2023-02-15 HISTORY — PX: INCISION AND DRAINAGE: SHX5863

## 2023-02-15 HISTORY — PX: BONE BIOPSY: SHX375

## 2023-02-15 LAB — CBC
HCT: 42.7 % (ref 39.0–52.0)
Hemoglobin: 14.7 g/dL (ref 13.0–17.0)
MCH: 29.4 pg (ref 26.0–34.0)
MCHC: 34.4 g/dL (ref 30.0–36.0)
MCV: 85.4 fL (ref 80.0–100.0)
Platelets: 317 10*3/uL (ref 150–400)
RBC: 5 MIL/uL (ref 4.22–5.81)
RDW: 12.2 % (ref 11.5–15.5)
WBC: 13.1 10*3/uL — ABNORMAL HIGH (ref 4.0–10.5)
nRBC: 0 % (ref 0.0–0.2)

## 2023-02-15 LAB — GLUCOSE, CAPILLARY
Glucose-Capillary: 145 mg/dL — ABNORMAL HIGH (ref 70–99)
Glucose-Capillary: 84 mg/dL (ref 70–99)
Glucose-Capillary: 75 mg/dL (ref 70–99)
Glucose-Capillary: 82 mg/dL (ref 70–99)
Glucose-Capillary: 89 mg/dL (ref 70–99)
Glucose-Capillary: 95 mg/dL (ref 70–99)

## 2023-02-15 LAB — BASIC METABOLIC PANEL
Anion gap: 9 (ref 5–15)
BUN: 15 mg/dL (ref 6–20)
CO2: 25 mmol/L (ref 22–32)
Calcium: 9.2 mg/dL (ref 8.9–10.3)
Chloride: 102 mmol/L (ref 98–111)
Creatinine, Ser: 0.78 mg/dL (ref 0.61–1.24)
GFR, Estimated: >60 mL/min (ref >60)
Glucose, Bld: 112 mg/dL — ABNORMAL HIGH (ref 70–99)
Potassium: 3.4 mmol/L — ABNORMAL LOW (ref 3.5–5.1)
Sodium: 136 mmol/L (ref 135–145)

## 2023-02-15 LAB — HEMOGLOBIN A1C
Hgb A1c MFr Bld: 6 % — ABNORMAL HIGH (ref 4.8–5.6)
Mean Plasma Glucose: 125.5 mg/dL

## 2023-02-15 LAB — MAGNESIUM: Magnesium: 2 mg/dL (ref 1.7–2.4)

## 2023-02-15 SURGERY — INCISION AND DRAINAGE
Anesthesia: Monitor Anesthesia Care | Site: Foot | Laterality: Right

## 2023-02-15 MED ORDER — ORAL CARE MOUTH RINSE: 15.0000 mL | Freq: Once | OROMUCOSAL | Status: AC

## 2023-02-15 MED ORDER — FENTANYL CITRATE (PF) 250 MCG/5ML IJ SOLN
INTRAMUSCULAR | Status: DC | PRN
  Administered 2023-02-15: 50 ug via INTRAVENOUS

## 2023-02-15 MED ORDER — MIDAZOLAM HCL 2 MG/2ML IJ SOLN
INTRAMUSCULAR | Status: AC
  Filled 2023-02-15: qty 2

## 2023-02-15 MED ORDER — PROPOFOL 500 MG/50ML IV EMUL
INTRAVENOUS | Status: DC | PRN
  Administered 2023-02-15: 100 ug/kg/min via INTRAVENOUS

## 2023-02-15 MED ORDER — CHLORHEXIDINE GLUCONATE 0.12 % MT SOLN: 15.0000 mL | Freq: Once | OROMUCOSAL | Status: AC

## 2023-02-15 MED ORDER — CHLORHEXIDINE GLUCONATE 0.12 % MT SOLN
OROMUCOSAL | Status: AC
  Administered 2023-02-15: 15 mL via OROMUCOSAL
  Filled 2023-02-15: qty 15

## 2023-02-15 MED ORDER — AMISULPRIDE (ANTIEMETIC) 5 MG/2ML IV SOLN: 10.0000 mg | Freq: Once | INTRAVENOUS | Status: DC | PRN

## 2023-02-15 MED ORDER — PHENYLEPHRINE 80 MCG/ML (10ML) SYRINGE FOR IV PUSH (FOR BLOOD PRESSURE SUPPORT)
PREFILLED_SYRINGE | INTRAVENOUS | Status: DC | PRN
  Administered 2023-02-15: 80 ug via INTRAVENOUS

## 2023-02-15 MED ORDER — OXYCODONE HCL 5 MG PO TABS: 5.0000 mg | ORAL_TABLET | Freq: Once | ORAL | Status: DC | PRN

## 2023-02-15 MED ORDER — 0.9 % SODIUM CHLORIDE (POUR BTL) OPTIME
TOPICAL | Status: DC | PRN
  Administered 2023-02-15: 1000 mL

## 2023-02-15 MED ORDER — PROMETHAZINE HCL 25 MG/ML IJ SOLN: 6.2500 mg | INTRAMUSCULAR | Status: DC | PRN

## 2023-02-15 MED ORDER — LIDOCAINE HCL (PF) 1 % IJ SOLN
INTRAMUSCULAR | Status: AC
  Filled 2023-02-15: qty 30

## 2023-02-15 MED ORDER — LACTATED RINGERS IV SOLN: INTRAVENOUS | Status: DC

## 2023-02-15 MED ORDER — ACETAMINOPHEN 10 MG/ML IV SOLN: 1000.0000 mg | Freq: Once | INTRAVENOUS | Status: DC | PRN

## 2023-02-15 MED ORDER — FENTANYL CITRATE (PF) 250 MCG/5ML IJ SOLN
INTRAMUSCULAR | Status: AC
  Filled 2023-02-15: qty 5

## 2023-02-15 MED ORDER — MIDAZOLAM HCL 2 MG/2ML IJ SOLN
INTRAMUSCULAR | Status: DC | PRN
  Administered 2023-02-15: 2 mg via INTRAVENOUS

## 2023-02-15 MED ORDER — LIDOCAINE HCL 1 % IJ SOLN
INTRAMUSCULAR | Status: DC | PRN
  Administered 2023-02-15: 20 mL

## 2023-02-15 MED ORDER — INSULIN ASPART 100 UNIT/ML IJ SOLN: 0.0000 [IU] | INTRAMUSCULAR | Status: DC | PRN

## 2023-02-15 MED ORDER — TOBRAMYCIN SULFATE 80 MG/2ML IJ SOLN
INTRAMUSCULAR | Status: DC | PRN
  Administered 2023-02-15: 120 mg

## 2023-02-15 MED ORDER — FENTANYL CITRATE (PF) 100 MCG/2ML IJ SOLN: 25.0000 ug | INTRAMUSCULAR | Status: DC | PRN

## 2023-02-15 MED ORDER — VANCOMYCIN HCL 1000 MG IV SOLR
INTRAVENOUS | Status: DC | PRN
  Administered 2023-02-15: 500 mg

## 2023-02-15 MED ORDER — POTASSIUM CHLORIDE CRYS ER 20 MEQ PO TBCR
40.0000 meq | EXTENDED_RELEASE_TABLET | Freq: Once | ORAL | Status: AC
  Administered 2023-02-15: 40 meq via ORAL
  Filled 2023-02-15: qty 2

## 2023-02-15 MED ORDER — VANCOMYCIN HCL 1000 MG IV SOLR
INTRAVENOUS | Status: AC
  Filled 2023-02-15: qty 20

## 2023-02-15 MED ORDER — TOBRAMYCIN SULFATE 80 MG/2ML IJ SOLN
INTRAMUSCULAR | Status: AC
  Filled 2023-02-15: qty 6

## 2023-02-15 MED ORDER — OXYCODONE HCL 5 MG/5ML PO SOLN: 5.0000 mg | Freq: Once | ORAL | Status: DC | PRN

## 2023-02-15 SURGICAL SUPPLY — 45 items
BAG COUNTER SPONGE SURGICOUNT (BAG) ×2 IMPLANT
BAG SPNG CNTER NS LX DISP (BAG) ×2
BLADE LONG MED 31X9 (MISCELLANEOUS) IMPLANT
BNDG CMPR 9X4 STRL LF SNTH (GAUZE/BANDAGES/DRESSINGS) ×2
BNDG CONFORM 2 STRL LF (GAUZE/BANDAGES/DRESSINGS) ×2 IMPLANT
BNDG ELASTIC 3X5.8 VLCR STR LF (GAUZE/BANDAGES/DRESSINGS) ×2 IMPLANT
BNDG ELASTIC 4X5.8 VLCR STR LF (GAUZE/BANDAGES/DRESSINGS) IMPLANT
BNDG ESMARK 4X9 LF (GAUZE/BANDAGES/DRESSINGS) ×2 IMPLANT
BNDG GAUZE DERMACEA FLUFF 4 (GAUZE/BANDAGES/DRESSINGS) ×2 IMPLANT
BNDG GZE DERMACEA 4 6PLY (GAUZE/BANDAGES/DRESSINGS) ×2
CNTNR URN SCR LID CUP LEK RST (MISCELLANEOUS) IMPLANT
CONT SPEC 4OZ STRL OR WHT (MISCELLANEOUS) ×2
COVER SURGICAL LIGHT HANDLE (MISCELLANEOUS) IMPLANT
CUFF TOURN SGL QUICK 18X4 (TOURNIQUET CUFF) IMPLANT
DRSG EMULSION OIL 3X3 NADH (GAUZE/BANDAGES/DRESSINGS) ×2 IMPLANT
DURAPREP 26ML APPLICATOR (WOUND CARE) ×2 IMPLANT
ELECT REM PT RETURN 9FT ADLT (ELECTROSURGICAL) ×2
ELECTRODE REM PT RTRN 9FT ADLT (ELECTROSURGICAL) ×2 IMPLANT
GAUZE PACKING IODOFORM 1/4X15 (PACKING) IMPLANT
GAUZE SPONGE 4X4 12PLY STRL (GAUZE/BANDAGES/DRESSINGS) ×2 IMPLANT
GLOVE BIO SURGEON STRL SZ8 (GLOVE) ×4 IMPLANT
GOWN STRL REUS W/ TWL LRG LVL3 (GOWN DISPOSABLE) ×2 IMPLANT
GOWN STRL REUS W/ TWL XL LVL3 (GOWN DISPOSABLE) ×2 IMPLANT
GOWN STRL REUS W/TWL LRG LVL3 (GOWN DISPOSABLE) ×2
GOWN STRL REUS W/TWL XL LVL3 (GOWN DISPOSABLE) ×2
HANDPIECE INTERPULSE COAX TIP (DISPOSABLE) ×2
KIT BASIN OR (CUSTOM PROCEDURE TRAY) ×2 IMPLANT
KIT STIMULAN RAPID CURE 5CC (Orthopedic Implant) IMPLANT
NDL HYPO 25X1 1.5 SAFETY (NEEDLE) ×2 IMPLANT
NEEDLE HYPO 25X1 1.5 SAFETY (NEEDLE) ×2 IMPLANT
NS IRRIG 1000ML POUR BTL (IV SOLUTION) IMPLANT
PACK ORTHO EXTREMITY (CUSTOM PROCEDURE TRAY) ×2 IMPLANT
SET HNDPC FAN SPRY TIP SCT (DISPOSABLE) IMPLANT
SPIKE FLUID TRANSFER (MISCELLANEOUS) IMPLANT
SUCTION FRAZIER HANDLE 10FR (MISCELLANEOUS) ×2
SUCTION TUBE FRAZIER 10FR DISP (MISCELLANEOUS) ×2 IMPLANT
SUT PROLENE 3 0 PS 2 (SUTURE) ×2 IMPLANT
SUT PROLENE 3 0 SH 48 (SUTURE) IMPLANT
SWAB CULTURE ESWAB REG 1ML (MISCELLANEOUS) IMPLANT
SWAB CULTURE LIQ STUART DBL (MISCELLANEOUS) IMPLANT
SYR 10ML LL (SYRINGE) IMPLANT
SYR CONTROL 10ML LL (SYRINGE) IMPLANT
TUBE CONNECTING 12X1/4 (SUCTIONS) ×2 IMPLANT
UNDERPAD 30X36 HEAVY ABSORB (UNDERPADS AND DIAPERS) ×2 IMPLANT
YANKAUER SUCT BULB TIP NO VENT (SUCTIONS) IMPLANT

## 2023-02-15 NOTE — Transfer of Care (Signed)
Immediate Anesthesia Transfer of Care Note  Patient: [name]  Procedure(s) Performed: INCISION AND DRAINAGE OF RIGHT FOOT WITH FOURTH TOE HEAD RESECTION AND STIMULAN BEADS (Right: Foot) BONE BIOPSY (Right)  Patient Location: PACU  Anesthesia Type:MAC  Level of Consciousness: awake, alert , and oriented  Airway & Oxygen Therapy: Patient Spontanous Breathing  Post-op Assessment: Report given to RN and Post -op Vital signs reviewed and stable  Post vital signs: Reviewed and stable  Last Vitals:  Vitals Value Taken Time  BP 111/72 02/15/23 1552  Temp    Pulse 98 02/15/23 1559  Resp 19 02/15/23 1559  SpO2 100 % 02/15/23 1559  Vitals shown include unvalidated device data.  Last Pain:  Vitals:   02/15/23 1309  TempSrc: Oral  PainSc:          Complications: No notable events documented.

## 2023-02-15 NOTE — Brief Op Note (Signed)
02/15/2023  3:44 PM  PATIENT:  [name]  27 y.o. male  PRE-OPERATIVE DIAGNOSIS:  Osteomyelitis, abscess  POST-OPERATIVE DIAGNOSIS:  * No post-op diagnosis entered *  PROCEDURE:  Procedure(s): INCISION AND DRAINAGE OF RIGHT FOOT WITH FOURTH Metatarsal HEAD RESECTION AND STIMULAN BEADS (Right) BONE BIOPSY (Right)  SURGEON:  Surgeon(s) and Role:    * Layne Lebon, Nena Audre Cenci, DPM - Primary  PHYSICIAN ASSISTANT:   ASSISTANTS: none   ANESTHESIA:   local and MAC  EBL:  30 mL  BLOOD ADMINISTERED:none  DRAINS: none   LOCAL MEDICATIONS USED:  LIDOCAINE   SPECIMEN:  Source of Specimen:  4th MPJ swab culture, 4th met head for pathology, 4th met head bone for culture  DISPOSITION OF SPECIMEN:   Path and micro  COUNTS:  YES  TOURNIQUET:  * Missing tourniquet times found for documented tourniquets in log: [id number] *  DICTATION: .Note written in EPIC  PLAN OF CARE: Admit to inpatient   PATIENT DISPOSITION:  PACU - hemodynamically stable.   Delay start of Pharmacological VTE agent (>24hrs) due to surgical blood loss or risk of bleeding: no

## 2023-02-15 NOTE — Op Note (Signed)
Full Operative Report  Date of Operation: 3:45 PM, 02/15/2023   Patient: [name] - 27 y.o. male  Surgeon: Yevonne Pax, DPM   Assistant: None  Diagnosis: Osteomyelitis, abscess  Procedure:  1. ***    Anesthesia: Monitor Anesthesia Care  Santa Lighter, MD  Anesthesiologist: Murvin Natal, MD; Santa Lighter, MD CRNA: Minerva Ends, CRNA; Maness, Kathrin Penner, CRNA   Estimated Blood Loss: Minimal ***  Hemostasis: 1) Anatomical dissection, mechanical compression, electrocautery 2) ***  Implants: Implant Name Type Inv. Item Serial No. Manufacturer Lot No. LRB No. Used Action  KIT STIMULAN RAPID CURE 5CC - [id number] Orthopedic Implant KIT STIMULAN RAPID CURE 5CC  [employer]  Right 1 Implanted    Materials: ***  Injectables: 1) Pre-operatively: *** cc of 0.5% marcaine plain 2) Post-operatively: ***None  Specimens: ***None   Antibiotics: ***2 gm Ancef Pre op  Drains: None  Complications: Patient tolerated the procedure well without complication.   Findings: as below  Indications for Procedure: [name] presents to Yevonne Pax, DPM with a chief complaint of *** The patient has failed conservative treatments of various modalities. At this time the patient has elected to proceed with surgical correction. All alternatives, risks, and complications of the procedures were thoroughly explained to the patient. Patient exhibits appropriate understanding of all discussion points and informed consent was signed and obtained in the chart with no guarantees to surgical outcome given or implied.  Description of Procedure: Patient was brought to the operating room and placed on the operative table in the *** position. Patient was secured to the table with safety belt, a contralateral SCD was placed, and all bony prominences were well padded. A surgical timeout was performed and all members of the operating room, the procedure, and the  surgical site were identified. *** anesthesia occurred. Local anesthetic as previously described was then injected about the operative field in a local infiltrative block.   A well padded pneumatic tourniquet was placed to the operative limb. The *** lower extremity was then prepped and draped in the usual sterile manner. The pneumatic tourniquet was inflated***. The following procedure then began.  ***  The surgical site was then dressed with ***. The tourniquet was deflated after *** minutes with immediate vascular return noted to the operative foot. The patient tolerated both the procedure and anesthesia well with vital signs stable throughout. The patient was transferred from the OR to recovery under the discretion of anesthesia.  Condition: Patient was taken to PACU in good condition and all vital signs stable and neurovascular status intact to the operative limb.  Discharge: ***  Yevonne Pax, DPM will follow the patient throughout the entire post-operative course and the patient is aware of all post-operative protocols in place. The patient will follow the protocol of rest, ice, and elevation. The patient will be *** in a *** to the operative limb until further instructed. The dressing is to remain clean, dry, and intact.   Ames Coupe, DPM

## 2023-02-15 NOTE — Progress Notes (Signed)
Pt positive MRSA pcr, protocol initiated. Ames Coupe, DPM made aware.

## 2023-02-15 NOTE — Progress Notes (Signed)
PODIATRY PROGRESS NOTE Patient Name: [name]  DOB May 14, 1996 DOA 02/13/2023  Hospital Day: 3  Assessment:  27 y.o. male with hx DM type 1 with peripheral neuropathy, PAD, HTN HLD, R foot chronic ulceration now with worsening R foot infection, concern for abscess and osteomyelitis of the 4th met head and base of proximal phalanx on MRI.  AF, VSS  WBC: 13.1 ESR/CRP: 102/ 10.2  Wound/Bone Cultures: pending OR  Imaging: MRI R foot W WO contrast New soft tissue edema, swelling, and draining sinus tracts dorsal and lateral to the distal metatarsal shaft through the proximal phalanx of the fourth digit. This includes a walled-off pocket of fluid suspicious for an abscess measuring up to 27 mm along the long axis of the foot. There is marrow edema within the fourth metatarsal head and trace marrow edema within the proximal plantar aspect of the 4th proximal phalanx suspicious for acute osteomyelitis.  Plan:  - NPO for OR today. Plan for I&D, resection of 4th met head and base of proximal phalanx. Discussed likely need for staged procedure and possible ray amputation in future if infection tracking in bone/proxima - Continue IV abx broad spectrum pending further culture data - WBAT in surgical shoe Will continue to follow        Everitt Amber, [place]    Subjective:  Pt seen in pre op. Discussed surgical plans for today. He would like to save the 4th toe if possible. I recommend we try to take out the 4th met head and part of 4th toe base to which he is agreeable.   Objective:   Vitals:   02/15/23 0351 02/15/23 1309  BP: 111/75 (!) 142/97  Pulse: 86 88  Resp: 17 18  Temp: 98.1 F (36.7 C) 98.2 F (36.8 C)  SpO2: 98% 98%       Latest Ref Rng & Units 02/15/2023    4:39 AM 02/14/2023    4:00 AM 02/13/2023    2:22 PM  CBC  WBC 4.0 - 10.5 K/uL 13.1  14.0  14.8   Hemoglobin 13.0 - 17.0 g/dL 14.7  14.8  15.7   Hematocrit 39.0 - 52.0 % 42.7   43.1  48.1   Platelets 150 - 400 K/uL 317  318  370        Latest Ref Rng & Units 02/15/2023    4:39 AM 02/14/2023    4:00 AM 02/13/2023    2:22 PM  BMP  Glucose 70 - 99 mg/dL 112  63  92   BUN 6 - 20 mg/dL '15  16  17   '$ Creatinine 0.61 - 1.24 mg/dL 0.78  0.81  0.82   Sodium 135 - 145 mmol/L 136  137  137   Potassium 3.5 - 5.1 mmol/L 3.4  3.8  4.5   Chloride 98 - 111 mmol/L 102  101  99   CO2 22 - 32 mmol/L '25  27  25   '$ Calcium 8.9 - 10.3 mg/dL 9.2  9.4  10.0     General: AAOx3, NAD  Lower Extremity Exam Vasc: R - PT palpable, DP palpable. Cap refill < 3 sec to digits  L - PT palpable, DP palpable. Cap refill <3 sec to digits  Derm: R - Erythema and edema of right 4th MPJ with fluctuance. Sinus tract with draining purulence. Decreased erythema from prior. No pain.        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 Gross motor function intact   L - Gross sensation absent. Gross motor function intact   Radiology:  Results reviewed. See assessment for pertinent imaging results

## 2023-02-15 NOTE — Progress Notes (Signed)
PROGRESS NOTE    [name]  [id number] DOB: Jun 16, 1996 DOA: 02/13/2023 PCP: Danna Hefty, DO    Brief Narrative:   [name] is a 27 y.o. 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  On Tuesday, patient had a total contact cast placed on his right lower extremity.  Patient reports started having pain in the right foot roughly 2 days later.  He went to his wound care specialist who remove the cast with notable significant erythema, swelling and purulent discharge; and patient was directed to the ED for possible debridement and IV antibiotics. MR right foot with soft tissue edema, swelling, draining sinus tracts dorsal/lateral to the distal metatarsal shaft through the proximal phalanx of the fourth digit, walled off pocket of fluid consistent with abscess measuring 27 mm, marrow edema within the fourth metatarsal head and trace marrow edema within the proximal plantar aspect of the fourth proximal phalanx suspicious for acute osteomyelitis.  Podiatry was consulted. Going to the OR on 2/23.  Assessment & Plan:   Left foot diabetic infection with osteomyelitis/abscess Previously had fifth ray amputation by podiatry.  MR left foot with findings consistent with abscess, acute osteomyelitis. -- Podiatry consulted, plan operative management on 2/23 -- Blood cultures x 2: Pending -- Vancomycin, pharmacy consulted for dosing/monitoring- MRSA swab + -- Ceftriaxone 2 g IV every 24 hours -- Metronidazole 5 mg IV every 12 hours  Diabetes mellitus type 1.5 Follows with endocrinology outpatient.  Home regimen includes Farxiga 10 mg p.o. daily, Lantus 26 units [place] BID.  Last hemoglobin A1c 8.4 on 11/14/2022, not optimally controlled. -- Semglee 10u [employer] BID -- Novolog moderate SSI for coverage  Essential  hypertension -- Metoprolol succinate 100 mg PO daily  HLD -- Atorvastatin 10 mg p.o. daily  DVT prophylaxis: SCD's Start: 02/14/23 1839 enoxaparin (LOVENOX) injection 40 mg Start: 02/13/23 2245    Code Status: Full Code Family Communication: No family present at bedside this morning  Disposition Plan:  Level of care: Med-Surg Status is: Inpatient Remains inpatient appropriate because: Pending surgical invention for acute osteomyelitis/abscess to left foot    Consultants:  Podiatry   Subjective: No overnight events-- understands about surgical recommendations   Objective: Vitals:   02/14/23 0859 02/14/23 1156 02/14/23 1908 02/15/23 0351  BP: 129/80 129/78 (!) 130/93 111/75  Pulse: 93 91 97 86  Resp: '18 18 17 17  '$ Temp: 98.3 F (36.8 C) 98 F (36.7 C)  98.1 F (36.7 C)  TempSrc: Oral Oral  Oral  SpO2: 100% 100% 100% 98%  Weight:      Height:        Intake/Output Summary (Last 24 hours) at 02/15/2023 1020 Last data filed at 02/14/2023 2145 Gross per 24 hour  Intake 827 ml  Output 1100 ml  Net -273 ml   Filed Weights   02/13/23 2000 02/13/23 2223  Weight: 85.7 kg 93.6 kg    Examination:  Physical Exam:  General: Appearance:     Overweight male in no acute distress     Lungs:     respirations unlabored  Heart:    Normal heart rate.  MS:   Right foot partial amputation noted.   Neurologic:   Awake, alert, oriented x 3. No apparent focal neurological           defect.  Data Reviewed: I have personally reviewed following labs and imaging studies  CBC: Recent Labs  Lab 02/13/23 1422 02/14/23 0400 02/15/23 0439  WBC 14.8* 14.0* 13.1*  NEUTROABS  --  8.9*  --   HGB 15.7 14.8 14.7  HCT 48.1 43.1 42.7  MCV 88.6 86.5 85.4  PLT 370 318 [id number]   Basic Metabolic Panel: Recent Labs  Lab 02/13/23 1422 02/14/23 0400 02/15/23 0439  NA 137 137 136  K 4.5 3.8 3.4*  CL 99 101 102  CO2 '25 27 25  '$ GLUCOSE 92 63* 112*  BUN '17 16 15   '$ CREATININE 0.82 0.81 0.78  CALCIUM 10.0 9.4 9.2  MG  --  2.2 2.0   GFR: Estimated Creatinine Clearance: 168.7 mL/min (by C-G formula based on SCr of 0.78 mg/dL). Liver Function Tests: No results for input(s): "AST", "ALT", "ALKPHOS", "BILITOT", "PROT", "ALBUMIN" in the last 168 hours. No results for input(s): "LIPASE", "AMYLASE" in the last 168 hours. No results for input(s): "AMMONIA" in the last 168 hours. Coagulation Profile: No results for input(s): "INR", "PROTIME" in the last 168 hours. Cardiac Enzymes: No results for input(s): "CKTOTAL", "CKMB", "CKMBINDEX", "TROPONINI" in the last 168 hours. BNP (last 3 results) No results for input(s): "PROBNP" in the last 8760 hours. HbA1C: Recent Labs    02/15/23 0439  HGBA1C 6.0*   CBG: Recent Labs  Lab 02/14/23 1114 02/14/23 1549 02/14/23 1820 02/14/23 2137 02/15/23 0726  GLUCAP 114* 137* 137* 85 145*   Lipid Profile: No results for input(s): "CHOL", "HDL", "LDLCALC", "TRIG", "CHOLHDL", "LDLDIRECT" in the last 72 hours. Thyroid Function Tests: No results for input(s): "TSH", "T4TOTAL", "FREET4", "T3FREE", "THYROIDAB" in the last 72 hours. Anemia Panel: No results for input(s): "VITAMINB12", "FOLATE", "FERRITIN", "TIBC", "IRON", "RETICCTPCT" in the last 72 hours. Sepsis Labs: Recent Labs  Lab 02/13/23 1832  LATICACIDVEN 1.0    Recent Results (from the past 240 hour(s))  Aerobic Culture w Gram Stain (superficial specimen)     Status: None (Preliminary result)   Collection Time: 02/13/23 12:30 PM   Specimen: Wound  Result Value Ref Range Status   Specimen Description   Final    WOUND RIGHT FOOT Performed at [facility] [address], [place], [place] [address]    Special Requests   Final    NONE Performed at [facility] [place] [address] [place], [place] [address]    Gram Stain   Final    RARE WBC PRESENT, PREDOMINANTLY PMN MODERATE GRAM POSITIVE COCCI    Culture    Final    ABUNDANT STAPHYLOCOCCUS AUREUS SUSCEPTIBILITIES TO FOLLOW Performed at [facility], [place] [address], [place], [place] [address]    Report Status PENDING  Incomplete  Blood culture (routine x 2)     Status: None (Preliminary result)   Collection Time: 02/13/23  6:32 PM   Specimen: BLOOD LEFT HAND  Result Value Ref Range Status   Specimen Description BLOOD LEFT HAND  Final   Special Requests   Final    BOTTLES DRAWN AEROBIC AND ANAEROBIC Blood Culture results may not be optimal due to an excessive volume of blood received in culture bottles   Culture   Final    NO GROWTH 2 DAYS Performed at [facility], [place] [address] [place], [place] [address]    Report Status PENDING  Incomplete  Blood culture (routine x 2)     Status: None (Preliminary result)   Collection Time: 02/13/23  6:32  PM   Specimen: BLOOD RIGHT WRIST  Result Value Ref Range Status   Specimen Description BLOOD RIGHT WRIST  Final   Special Requests   Final    BOTTLES DRAWN AEROBIC AND ANAEROBIC Blood Culture results may not be optimal due to an inadequate volume of blood received in culture bottles   Culture   Final    NO GROWTH 2 DAYS Performed at [facility], [place] [address], [place], [place] [address]    Report Status PENDING  Incomplete  MRSA Next Gen by PCR, Nasal     Status: Abnormal   Collection Time: 02/14/23 10:04 AM   Specimen: Nasal Mucosa; Nasal Swab  Result Value Ref Range Status   MRSA by PCR Next Gen DETECTED (A) NOT DETECTED Final    Comment: RESULT CALLED TO, READ BACK BY AND VERIFIED WITH: RN TAKENYA BURTON ON 02/14/23 @ 2254 BY DRT (NOTE) The GeneXpert MRSA Assay (FDA approved for NASAL specimens only), is one component of a comprehensive MRSA colonization surveillance program. It is not intended to diagnose MRSA infection nor to guide or monitor treatment for MRSA infections. Test performance is not FDA approved in patients less than 76 years old. Performed at [facility], [place] [address] [place], [place] [address]          Radiology Studies: MR FOOT RIGHT WO CONTRAST  Result Date: 02/13/2023 CLINICAL DATA:  27 year old male with worsening right foot infection status post amputation. Seen this morning by wound care and redirected to the emergency department for possible debridement and IV antibiotics. Purulent drainage and pain to previous amputation site. Prior MRI 01/30/2023 without evidence of abscess. New suspected abscess. EXAM: MRI OF THE RIGHT FOREFOOT WITHOUT CONTRAST TECHNIQUE: Multiplanar, multisequence MR imaging of the right foot radiographs 02/13/2023 and 01/30/2023; was performed. No intravenous contrast was administered. COMPARISON:  Right foot radiographs 02/13/2023 and 10/04/2022; MRI right forefoot 01/30/2022 FINDINGS: Despite efforts by the technologist and patient, mild-to-moderate motion artifact is present on today's exam and could not be eliminated. This reduces exam sensitivity and specificity. Bones/Joint/Cartilage Postsurgical changes are again seen of amputation of fifth digit to the mid shaft of the fifth metatarsal. Normal marrow signal is seen within the remaining fifth metatarsal. There is trace marrow edema at the proximal plantar aspect of the proximal phalanx of the fourth digit (sagittal series 8, image 17). In the region of the soft tissue swelling and sinus tracts at the distal lateral aspect of the forefoot described below,there is mild marrow edema within the plantar and lateral aspects of the fourth metatarsal head, however no significant cortical erosion is seen. Findings could represent non-infectious osteitis, however are highly concerning for osteomyelitis given the adjacent sinus tract. Ligaments The Lisfranc ligament complex is intact. The visualized collateral ligaments appear intact. Muscles and Tendons There is diffuse edema seen within the plantar greater than dorsal midfoot musculature, nonspecific myositis.  Soft tissues There is new moderate edema and swelling within the soft tissues just plantar lateral to the 4th toe base of the proximal phalanx (axial series 5, images 34 through 39), and this is contiguous with soft tissue swelling and edema more proximally within the dorsal greater than plantar soft tissues at the level of the distal fourth metaphysis (axial series 5 images 40 through 49). Within the soft tissue swelling just plantar lateral to the fourth metatarsophalangeal joint there is a fluid bright sinus tract draining from the lateral aspect of the fourth metatarsal head to  the ventral lateral skin surface (coronal series 7 images 19 through 21 and axial series 5 images 36 through 39. There is mild edema within the plantar and lateral aspects of the fourth metatarsal head, however no significant cortical erosion is seen. There is an additional sinus tract that extends dorsally in the region just lateral and dorsal to the proximal aspect of the fourth metatarsal head and the distal shaft of the fourth metatarsal (axial series 5 images 45 through 49 and coronal series 7 images 11 through 14), draining to the skin surface. The deepest aspect of this fluid pools in a walled-off pocket of fluid that measures up to approximately 8 x 27 x 11 mm (transverse by long axis of the foot by short axis of the foot as measured on coronal image 13 and axial image 46) suspicious for an abscess. There are scattered foci of decreased T1 and decreased T2 signal air within the surrounding soft tissues. IMPRESSION: Compared to 01/30/2023: 1. Postsurgical changes are again seen of amputation of the fifth digit to the level of the mid shaft of the metatarsal. 2. New soft tissue edema, swelling, and draining sinus tracts dorsal and lateral to the distal metatarsal shaft through the proximal phalanx of the fourth digit. This includes a walled-off pocket of fluid suspicious for an abscess measuring up to 27 mm along the long axis of  the foot. There is marrow edema within the fourth metatarsal head and trace marrow edema within the proximal plantar aspect of the 4th proximal phalanx suspicious for acute osteomyelitis. Electronically Signed   By: Yvonne Kendall M.D.   On: 02/13/2023 19:51   DG Foot Complete Right  Result Date: 02/13/2023 CLINICAL DATA:  Status post fifth transmetatarsal amputation with worsening infection at the amputation site EXAM: RIGHT FOOT COMPLETE - 3 VIEW COMPARISON:  Right foot radiograph dated 10/04/2022, MR right foot dated 01/30/2023 FINDINGS: Postsurgical changes from fifth trans metatarsal amputation. There is no evidence of fracture or dislocation. There is no evidence of arthropathy or other focal bone abnormality. Soft tissue defect along the lateral forefoot at the amputation site. No underlying cortical erosion to suggest osteomyelitis. IMPRESSION: Soft tissue defect along the lateral forefoot at the amputation site. No underlying cortical erosion to suggest osteomyelitis. Electronically Signed   By: Darrin Nipper M.D.   On: 02/13/2023 14:59        Scheduled Meds:  atorvastatin  10 mg Oral Daily   busPIRone  5 mg Oral BID   DULoxetine  30 mg Oral Daily   enoxaparin (LOVENOX) injection  40 mg Subcutaneous QHS   insulin aspart  0-15 Units Subcutaneous TID WC   insulin aspart  0-5 Units Subcutaneous QHS   insulin glargine-yfgn  10 Units Subcutaneous BID   metoprolol succinate  100 mg Oral QHS   mupirocin ointment  1 Application Nasal BID   zolpidem  10 mg Oral QHS   Continuous Infusions:  cefTRIAXone (ROCEPHIN)  IV 2 g (02/14/23 2008)   metronidazole 500 mg (02/15/23 0858)   vancomycin 1,250 mg (02/14/23 2247)     LOS: 2 days    Time spent: 52 minutes spent on chart review, discussion with nursing staff, consultants, updating family and interview/physical exam; more than 50% of that time was spent in counseling and/or coordination of care.    Geradine Girt, DO [facility] Available via Epic secure chat 7am-7pm After these hours, please refer to coverage provider listed on [email or web address] 02/15/2023, 10:20 AM

## 2023-02-15 NOTE — Anesthesia Preprocedure Evaluation (Addendum)
Anesthesia Evaluation  Patient identified by MRN, date of birth, ID band Patient awake    Reviewed: Allergy & Precautions, NPO status , Patient's Chart, lab work & pertinent test results  Airway Mallampati: II  TM Distance: >3 FB Neck ROM: Full    Dental  (+) Missing, Poor Dentition   Pulmonary neg pulmonary ROS   Pulmonary exam normal breath sounds clear to auscultation       Cardiovascular hypertension, Pt. on home beta blockers Normal cardiovascular exam     Neuro/Psych  Headaches PSYCHIATRIC DISORDERS Anxiety Depression       GI/Hepatic negative GI ROS, Neg liver ROS,,,  Endo/Other  diabetes, Insulin Dependent    Renal/GU negative Renal ROS     Musculoskeletal   Abdominal   Peds  Hematology negative hematology ROS (+)   Anesthesia Other Findings Osteomyelitis, abscess  Reproductive/Obstetrics                             Anesthesia Physical Anesthesia Plan  ASA: 3  Anesthesia Plan: MAC   Post-op Pain Management:    Induction: Intravenous  PONV Risk Score and Plan: 1 and Ondansetron, Dexamethasone, Propofol infusion, Midazolam and Treatment may vary due to age or medical condition  Airway Management Planned: Simple Face Mask  Additional Equipment:   Intra-op Plan:   Post-operative Plan:   Informed Consent: I have reviewed the patients History and Physical, chart, labs and discussed the procedure including the risks, benefits and alternatives for the proposed anesthesia with the patient or authorized representative who has indicated his/her understanding and acceptance.     Dental advisory given  Plan Discussed with: CRNA  Anesthesia Plan Comments:        Anesthesia Quick Evaluation

## 2023-02-15 NOTE — Progress Notes (Signed)
Orthopedic Tech Progress Note Patient Details:  [name] 30-Jun-1996 [id number]  Ortho Devices Type of Ortho Device: Postop shoe/boot Ortho Device/Splint Location: RLE Ortho Device/Splint Interventions: Ordered, Adjustment   Post Interventions Patient Tolerated: Well Instructions Provided: Adjustment of device, Care of device, Poper ambulation with device  [place] [facility] 02/15/2023, 6:26 PM

## 2023-02-16 DIAGNOSIS — M86171 Other acute osteomyelitis, right ankle and foot: Secondary | ICD-10-CM | POA: Diagnosis not present

## 2023-02-16 DIAGNOSIS — Z794 Long term (current) use of insulin: Secondary | ICD-10-CM | POA: Diagnosis not present

## 2023-02-16 DIAGNOSIS — E1169 Type 2 diabetes mellitus with other specified complication: Secondary | ICD-10-CM

## 2023-02-16 LAB — AEROBIC CULTURE W GRAM STAIN (SUPERFICIAL SPECIMEN)

## 2023-02-16 LAB — GLUCOSE, CAPILLARY
Glucose-Capillary: 95 mg/dL (ref 70–99)
Glucose-Capillary: 88 mg/dL (ref 70–99)
Glucose-Capillary: 169 mg/dL — ABNORMAL HIGH (ref 70–99)
Glucose-Capillary: 99 mg/dL (ref 70–99)

## 2023-02-16 MED ORDER — POTASSIUM CHLORIDE CRYS ER 20 MEQ PO TBCR
40.0000 meq | EXTENDED_RELEASE_TABLET | Freq: Once | ORAL | Status: AC
  Administered 2023-02-16: 40 meq via ORAL
  Filled 2023-02-16: qty 2

## 2023-02-16 MED ORDER — SODIUM CHLORIDE 0.9 % IV SOLN: INTRAVENOUS | Status: DC | PRN

## 2023-02-16 NOTE — Progress Notes (Signed)
PROGRESS NOTE    [name]  [id number] DOB: 1996-08-30 DOA: 02/13/2023 PCP: Danna Hefty, DO    Brief Narrative:   [name] is a 27 y.o. 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  On Tuesday, patient had a total contact cast placed on his right lower extremity.  Patient reports started having pain in the right foot roughly 2 days later.  He went to his wound care specialist who remove the cast with notable significant erythema, swelling and purulent discharge; and patient was directed to the ED for possible debridement and IV antibiotics. MR right foot with soft tissue edema, swelling, draining sinus tracts dorsal/lateral to the distal metatarsal shaft through the proximal phalanx of the fourth digit, walled off pocket of fluid consistent with abscess measuring 27 mm, marrow edema within the fourth metatarsal head and trace marrow edema within the proximal plantar aspect of the fourth proximal phalanx suspicious for acute osteomyelitis.  Podiatry was consulted. went to the OR on 2/23.  Assessment & Plan:   Left foot diabetic infection with osteomyelitis/abscess Previously had fifth ray amputation by podiatry.  MR left foot with findings consistent with abscess, acute osteomyelitis. -- Podiatry consulted, s/poperative management on 2/23 -- Vancomycin, pharmacy consulted for dosing/monitoring- MRSA swab + -- Ceftriaxone 2 g IV every 24 hours -- Metronidazole 5 mg IV every 12 hours -culture of bone showing staph a-- will get ID consult this weekend as patient may need to go home on IV abx  Diabetes mellitus type 1.5 Follows with endocrinology outpatient.  Home regimen includes Farxiga 10 mg p.o. daily, Lantus 26 units [place] BID.  Last hemoglobin A1c 8.4 on 11/14/2022, not optimally controlled. --  Semglee 10u  BID -- Novolog moderate SSI for coverage  Essential hypertension -- Metoprolol succinate 100 mg PO daily  HLD -- Atorvastatin 10 mg p.o. daily  DVT prophylaxis: enoxaparin (LOVENOX) injection 40 mg Start: 02/13/23 2245    Code Status: Full Code Family Communication: No family present at bedside this morning  Disposition Plan:  Level of care: Med-Surg Status is: Inpatient Remains inpatient appropriate because: Pending surgical invention for acute osteomyelitis/abscess to left foot- IV abx?    Consultants:  Podiatry ID   Subjective: Asking about weight bearing status  Objective: Vitals:   02/15/23 2004 02/16/23 0009 02/16/23 0417 02/16/23 0725  BP: 117/81 114/81 112/76 113/74  Pulse: 92 90 98 99  Resp: '15 18 18 18  '$ Temp: 98.4 F (36.9 C) 97.8 F (36.6 C) 97.9 F (36.6 C) 98 F (36.7 C)  TempSrc: Oral     SpO2: 100% 100% 99% 99%  Weight:      Height:        Intake/Output Summary (Last 24 hours) at 02/16/2023 1112 Last data filed at 02/16/2023 0700 Gross per 24 hour  Intake 1090 ml  Output 500 ml  Net 590 ml   Filed Weights   02/13/23 2000 02/13/23 2223 02/15/23 1309  Weight: 85.7 kg 93.6 kg 90.3 kg    Examination:  Physical Exam:  General: Appearance:     Overweight male in no acute distress     Lungs:     respirations unlabored  Heart:    Normal heart rate.  MS:   Right foot partial amputation noted.   Neurologic:   Awake, alert, oriented x 3.  No apparent focal neurological           defect.           Data Reviewed: I have personally reviewed following labs and imaging studies  CBC: Recent Labs  Lab 02/13/23 1422 02/14/23 0400 02/15/23 0439  WBC 14.8* 14.0* 13.1*  NEUTROABS  --  8.9*  --   HGB 15.7 14.8 14.7  HCT 48.1 43.1 42.7  MCV 88.6 86.5 85.4  PLT 370 318 [id number]   Basic Metabolic Panel: Recent Labs  Lab 02/13/23 1422 02/14/23 0400 02/15/23 0439  NA 137 137 136  K 4.5 3.8 3.4*  CL 99 101 102  CO2 '25 27 25   '$ GLUCOSE 92 63* 112*  BUN '17 16 15  '$ CREATININE 0.82 0.81 0.78  CALCIUM 10.0 9.4 9.2  MG  --  2.2 2.0   GFR: Estimated Creatinine Clearance: 168.7 mL/min (by C-G formula based on SCr of 0.78 mg/dL). Liver Function Tests: No results for input(s): "AST", "ALT", "ALKPHOS", "BILITOT", "PROT", "ALBUMIN" in the last 168 hours. No results for input(s): "LIPASE", "AMYLASE" in the last 168 hours. No results for input(s): "AMMONIA" in the last 168 hours. Coagulation Profile: No results for input(s): "INR", "PROTIME" in the last 168 hours. Cardiac Enzymes: No results for input(s): "CKTOTAL", "CKMB", "CKMBINDEX", "TROPONINI" in the last 168 hours. BNP (last 3 results) No results for input(s): "PROBNP" in the last 8760 hours. HbA1C: Recent Labs    02/15/23 0439  HGBA1C 6.0*   CBG: Recent Labs  Lab 02/15/23 1434 02/15/23 1553 02/15/23 1632 02/15/23 2005 02/16/23 0723  GLUCAP 75 82 89 95 95   Lipid Profile: No results for input(s): "CHOL", "HDL", "LDLCALC", "TRIG", "CHOLHDL", "LDLDIRECT" in the last 72 hours. Thyroid Function Tests: No results for input(s): "TSH", "T4TOTAL", "FREET4", "T3FREE", "THYROIDAB" in the last 72 hours. Anemia Panel: No results for input(s): "VITAMINB12", "FOLATE", "FERRITIN", "TIBC", "IRON", "RETICCTPCT" in the last 72 hours. Sepsis Labs: Recent Labs  Lab 02/13/23 1832  LATICACIDVEN 1.0    Recent Results (from the past 240 hour(s))  Aerobic Culture w Gram Stain (superficial specimen)     Status: None   Collection Time: 02/13/23 12:30 PM   Specimen: Wound  Result Value Ref Range Status   Specimen Description   Final    WOUND RIGHT FOOT Performed at [facility] [address], [place], [place] [address]    Special Requests   Final    NONE Performed at [facility], [place] [address] [place], [place] [address]    Gram Stain   Final    RARE WBC PRESENT, PREDOMINANTLY PMN MODERATE GRAM POSITIVE COCCI Performed  at [facility], [place] [address], [place], [place] [address]    Culture   Final    ABUNDANT METHICILLIN RESISTANT STAPHYLOCOCCUS AUREUS   Report Status 02/16/2023 FINAL  Final   Organism ID, Bacteria METHICILLIN RESISTANT STAPHYLOCOCCUS AUREUS  Final      Susceptibility   Methicillin resistant staphylococcus aureus - MIC*    CIPROFLOXACIN >=8 RESISTANT Resistant     ERYTHROMYCIN >=8 RESISTANT Resistant     GENTAMICIN <=0.5 SENSITIVE Sensitive     OXACILLIN >=4 RESISTANT Resistant     TETRACYCLINE <=1 SENSITIVE Sensitive     VANCOMYCIN 1 SENSITIVE Sensitive     TRIMETH/SULFA <=10 SENSITIVE Sensitive     CLINDAMYCIN <=0.25 SENSITIVE Sensitive     RIFAMPIN <=0.5 SENSITIVE Sensitive     Inducible Clindamycin NEGATIVE Sensitive     *  ABUNDANT METHICILLIN RESISTANT STAPHYLOCOCCUS AUREUS  Blood culture (routine x 2)     Status: None (Preliminary result)   Collection Time: 02/13/23  6:32 PM   Specimen: BLOOD LEFT HAND  Result Value Ref Range Status   Specimen Description BLOOD LEFT HAND  Final   Special Requests   Final    BOTTLES DRAWN AEROBIC AND ANAEROBIC Blood Culture results may not be optimal due to an excessive volume of blood received in culture bottles   Culture   Final    NO GROWTH 3 DAYS Performed at [facility], [place] [address], [place], [place] [address]    Report Status PENDING  Incomplete  Blood culture (routine x 2)     Status: None (Preliminary result)   Collection Time: 02/13/23  6:32 PM   Specimen: BLOOD RIGHT WRIST  Result Value Ref Range Status   Specimen Description BLOOD RIGHT WRIST  Final   Special Requests   Final    BOTTLES DRAWN AEROBIC AND ANAEROBIC Blood Culture results may not be optimal due to an inadequate volume of blood received in culture bottles   Culture   Final    NO GROWTH 3 DAYS Performed at [facility], [place] [address] [place], [place] [address]    Report Status PENDING  Incomplete  MRSA Next Gen by PCR, Nasal     Status:  Abnormal   Collection Time: 02/14/23 10:04 AM   Specimen: Nasal Mucosa; Nasal Swab  Result Value Ref Range Status   MRSA by PCR Next Gen DETECTED (A) NOT DETECTED Final    Comment: RESULT CALLED TO, READ BACK BY AND VERIFIED WITH: RN TAKENYA BURTON ON 02/14/23 @ 2254 BY DRT (NOTE) The GeneXpert MRSA Assay (FDA approved for NASAL specimens only), is one component of a comprehensive MRSA colonization surveillance program. It is not intended to diagnose MRSA infection nor to guide or monitor treatment for MRSA infections. Test performance is not FDA approved in patients less than 48 years old. Performed at [facility], [place] [address], [place], [place] [address]   Aerobic/Anaerobic Culture w Gram Stain (surgical/deep wound)     Status: None (Preliminary result)   Collection Time: 02/15/23  3:17 PM   Specimen: PATH Bone biopsy; Tissue  Result Value Ref Range Status   Specimen Description BONE  Final   Special Requests 4TH METATARSAL HEAD  Final   Gram Stain NO WBC SEEN NO ORGANISMS SEEN   Final   Culture   Final    NO GROWTH < 24 HOURS Performed at [facility], [place] [address] [place], [place] [address]    Report Status PENDING  Incomplete  Aerobic/Anaerobic Culture w Gram Stain (surgical/deep wound)     Status: None (Preliminary result)   Collection Time: 02/15/23  3:23 PM   Specimen: PATH Bone biopsy; Tissue  Result Value Ref Range Status   Specimen Description BONE  Final   Special Requests SWABS OF WOUND  Final   Gram Stain NO WBC SEEN RARE GRAM POSITIVE COCCI IN PAIRS   Final   Culture   Final    RARE STAPHYLOCOCCUS AUREUS CULTURE REINCUBATED FOR BETTER GROWTH SUSCEPTIBILITIES TO FOLLOW Performed at [facility], [place] [address] [place], [place] [address]    Report Status PENDING  Incomplete         Radiology Studies: DG Foot Complete Right  Result Date: 02/15/2023 CLINICAL DATA:  [id number] Post-operative state 252351 EXAM: RIGHT FOOT COMPLETE -  3+ VIEW COMPARISON:  Radiograph 02/13/2023 FINDINGS: Postsurgical changes of fourth metatarsal head resection with antibiotic bead placement. Prior partial fifth ray amputation. Adjacent soft tissue swelling in soft tissue defect. IMPRESSION: Postsurgical changes of fourth metatarsal head resection with antibiotic bead placement. Electronically Signed   By: Maurine Simmering M.D.   On: 02/15/2023 16:59        Scheduled Meds:  atorvastatin  10 mg Oral Daily   busPIRone  5 mg Oral BID   DULoxetine  30 mg Oral Daily   enoxaparin (LOVENOX) injection  40 mg Subcutaneous QHS   insulin aspart  0-15 Units Subcutaneous TID WC   insulin aspart  0-5 Units Subcutaneous QHS   insulin glargine-yfgn  10 Units Subcutaneous BID   metoprolol succinate  100 mg Oral QHS   mupirocin ointment  1 Application Nasal BID   zolpidem  10 mg Oral QHS   Continuous Infusions:  sodium chloride 10 mL/hr at 02/16/23 0811   cefTRIAXone (ROCEPHIN)  IV Stopped (02/15/23 2020)   metronidazole 500 mg (02/16/23 0813)   vancomycin 1,250 mg (02/16/23 1100)     LOS: 3 days    Time spent: 52 minutes spent on chart review, discussion with nursing staff, consultants, updating family and interview/physical exam; more than 50% of that time was spent in counseling and/or coordination of care.    Geradine Girt, DO [facility] Available via Epic secure chat 7am-7pm After these hours, please refer to coverage provider listed on [email or web address] 02/16/2023, 11:12 AM

## 2023-02-16 NOTE — Anesthesia Postprocedure Evaluation (Signed)
Anesthesia Post Note  Patient: [name]  Procedure(s) Performed: INCISION AND DRAINAGE OF RIGHT FOOT WITH FOURTH TOE HEAD RESECTION AND STIMULAN BEADS (Right: Foot) BONE BIOPSY (Right)     Patient location during evaluation: PACU Anesthesia Type: MAC Level of consciousness: awake and alert Pain management: pain level controlled Vital Signs Assessment: post-procedure vital signs reviewed and stable Respiratory status: spontaneous breathing, nonlabored ventilation, respiratory function stable and patient connected to nasal cannula oxygen Cardiovascular status: stable and blood pressure returned to baseline Postop Assessment: no apparent nausea or vomiting Anesthetic complications: no   No notable events documented.  Last Vitals:  Vitals:   02/16/23 0725 02/16/23 1512  BP: 113/74 113/67  Pulse: 99 97  Resp: 18 17  Temp: 36.7 C 36.9 C  SpO2: 99% 100%    Last Pain:  Vitals:   02/16/23 1512  TempSrc: Oral  PainSc:                  Santa Lighter

## 2023-02-16 NOTE — Consult Note (Signed)
[facility] for Infectious Disease    Date of Admission:  02/13/2023   Total days of inpatient antibiotics 3        Reason for Consult: DFU    Principal Problem:   Osteomyelitis ([place]) Active Problems:   Essential hypertension   Anxiety   Type 2 diabetes mellitus with hyperglycemia (HCC)   Acute osteomyelitis of right foot (HCC)   Mixed hyperlipidemia   Malnutrition of moderate degree   Diabetes mellitus due to underlying condition with hyperglycemia, with long-term current use of insulin (HCC)   Depression   Assessment: 27 year old male with diabetes, history of MRSA right foot wound treated with daptomycin/06/15/2021 presented with longstanding right wound that appears to be infected over the past week.   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 Noted that he was in a total contact cast for about a week, seen by wound care. -Not on abx prior  to hospital. Denies fever, chills. - Taken to the OR for procedure above Recommendations:  -Follow ortho plan to return to OR poisibbly pending clnical course.  The area of wound is still red and tender per note - D/C ceftriaxone tomorrow.  Continue vancomycin and metronidazole for now -Follow OR pathology and bone BX for culture   #Diabetes mellitus - A1c 6 on 2/23 - Continue glycemic control  I have personally spent 8 3mnutes involved in face-to-face and non-face-to-face activities for this patient on the day of the visit. Professional time spent includes the following activities: Preparing to see the patient (review of tests), Obtaining and/or reviewing separately obtained history (admission/discharge record), Performing a medically appropriate examination and/or evaluation , Ordering medications/tests/procedures, referring and communicating with other health care professionals, Documenting clinical  information in the EMR, Independently interpreting results (not separately reported), Communicating results to the patient/family/caregiver, Counseling and educating the patient/family/caregiver and Care coordination (not separately reported).   Microbiology:   Antibiotics: Vanc, ceftriaxone. Metronidazole 2/21-p Cultures: Blood 2/21 ND  2/23 bone swab(deep tissue) +staph aureus, 2/23 bone cs of 4th metatarsal head pending  HPI: Lucas LOEFFELHOLZis a 27y.o. male  with Hsof rt foot OM SP 5th ray amputaion with OR Cx+ MRSA treated with dapto x 6 week EOT 07/05/21, HTN, DM2, peripheral arterial insufficiency, HLD presented with rt foot wound.  Patient acquired [employer] phone for a moment of time.  He has been right lower extremity total contact cast for about a week.  He noted significant discomfort with cast.  Presented to wound care and noted significant erythema, swelling increased purulent drainage from wound.  On arrival to the ED, vital stable WBC 14 K.MRI right foot showed soft tissue edema, swelling, draining sinus tract dorsal and lateral distal metatarsal shaft through the proximal phalanx of the fourth digit.  Possible abscess measuring 27 mm long axis the foot.  Bone marrow edema fourth metatarsal head and proximal phalanx.  Patient placed on broad-spectrum antibiotics.  Podiatry consulted.  Taken to the OR on 2/23 for I&D of foot metatarsal head, excision and debridement, fourth metatarsal head bone sent for path and culture, deep wound swab   Review of Systems: Review of Systems  All other systems reviewed and are negative.   Past Medical History:  Diagnosis Date   Complication of anesthesia    Hard to wake up when he was younger once.   Diabetes mellitus, type  II ([place])    Heart murmur    Hypertension     Social History   Tobacco Use   Smoking status: Never    Passive exposure: Yes   Smokeless tobacco: Never  Vaping Use   Vaping Use: Never used  Substance Use Topics    Alcohol use: No   Drug use: No    Family History  Problem Relation Age of Onset   Healthy Mother    Healthy Father    Healthy Sister    Healthy Sister    Diabetes Maternal Grandmother    Thyroid disease Maternal Grandmother    Hypertension Maternal Grandmother    Neuropathy Maternal Grandmother    Scheduled Meds:  atorvastatin  10 mg Oral Daily   busPIRone  5 mg Oral BID   DULoxetine  30 mg Oral Daily   enoxaparin (LOVENOX) injection  40 mg Subcutaneous QHS   insulin aspart  0-15 Units Subcutaneous TID WC   insulin aspart  0-5 Units Subcutaneous QHS   insulin glargine-yfgn  10 Units Subcutaneous BID   metoprolol succinate  100 mg Oral QHS   mupirocin ointment  1 Application Nasal BID   zolpidem  10 mg Oral QHS   Continuous Infusions:  sodium chloride 10 mL/hr at 02/16/23 0811   cefTRIAXone (ROCEPHIN)  IV Stopped (02/15/23 2020)   metronidazole 500 mg (02/16/23 0813)   vancomycin 1,250 mg (02/16/23 1100)   PRN Meds:.sodium chloride, acetaminophen **OR** acetaminophen, HYDROcodone-acetaminophen, hydrOXYzine, morphine injection, senna-docusate Allergies  Allergen Reactions   Amoxicillin Hives    Has patient had a PCN reaction causing immediate rash, facial/tongue/throat swelling, SOB or lightheadedness with hypotension: No Has patient had a PCN reaction causing severe rash involving mucus membranes or skin necrosis: No Has patient had a PCN reaction that required hospitalization: No Has patient had a PCN reaction occurring within the last 10 years: No If all of the above answers are "NO", then may proceed with Cephalosporin use.    Lexapro [Escitalopram] Other (See Comments)    OBJECTIVE: Blood pressure 113/67, pulse 97, temperature 98.5 F (36.9 C), temperature source Oral, resp. rate 17, height '6\' 3"'$  (1.905 m), weight 90.3 kg, SpO2 100 %.  Physical Exam Constitutional:      General: He is not in acute distress.    Appearance: He is normal weight. He is not  toxic-appearing.  HENT:     Head: Normocephalic and atraumatic.     Right Ear: External ear normal.     Left Ear: External ear normal.     Nose: No congestion or rhinorrhea.     Mouth/Throat:     Mouth: Mucous membranes are moist.     Pharynx: Oropharynx is clear.  Eyes:     Extraocular Movements: Extraocular movements intact.     Conjunctiva/sclera: Conjunctivae normal.     Pupils: Pupils are equal, round, and reactive to light.  Cardiovascular:     Rate and Rhythm: Normal rate and regular rhythm.     Heart sounds: No murmur heard.    No friction rub. No gallop.  Pulmonary:     Effort: Pulmonary effort is normal.     Breath sounds: Normal breath sounds.  Abdominal:     General: Abdomen is flat. Bowel sounds are normal.     Palpations: Abdomen is soft.  Musculoskeletal:        General: No swelling. Normal range of motion.     Cervical back: Normal range of motion and neck supple.  Skin:  General: Skin is warm and dry.  Neurological:     General: No focal deficit present.     Mental Status: He is oriented to person, place, and time.  Psychiatric:        Mood and Affect: Mood normal.    Lab Results Lab Results  Component Value Date   WBC 13.1 (H) 02/15/2023   HGB 14.7 02/15/2023   HCT 42.7 02/15/2023   MCV 85.4 02/15/2023   PLT 317 02/15/2023    Lab Results  Component Value Date   CREATININE 0.78 02/15/2023   BUN 15 02/15/2023   NA 136 02/15/2023   K 3.4 (L) 02/15/2023   CL 102 02/15/2023   CO2 25 02/15/2023    Lab Results  Component Value Date   ALT 13 08/23/2022   AST 18 08/23/2022   ALKPHOS 60 08/23/2022   BILITOT 1.0 08/23/2022       Laurice Record, [facility] 02/16/2023, 3:18 PM

## 2023-02-16 NOTE — Progress Notes (Signed)
Pharmacy Antibiotic Note  [name] is a 27 y.o. male admitted on 02/13/2023 with  diabetic foot infection .  Pharmacy has been consulted for vanc dosing.  Pt was sent from wound clinic for eval of foot infection. Empiric vanc/ceftriaxone/flagyl have been ordered.   WBC 14.8>14>13.1,  afebrile.  Wound rt foot cx: positive MRSA   SCr 0.8s >0.78 stable   Antimicrobials this admission: 2/21 vanc>> 2/21 ceftriaxone>>2/27 2/21 flagyl>>2/27  Dose adjustments this admission:   Microbiology results: 2/21 BCx : ngtd 2/21 BCX: ngtd 2/21 wound rt foot cx: MRSA  2/22 MRSA PCR positive 2/23 bone 4th metarsal head tissue cx :  ngtd  2/23  bone path bx tissue cx:  staph aureus,  reincubated , pending  Plan: Continue Vancomycin 1.25g IV q12h >>AUC 429, used SCr 0.82  Monitor cultures clinical progress, renal function. Check steady state vancomycin levels per protocol  tomorrow.  Vanc peadk due 13:30 tomorrow 4/25 and trough due at 22:30 4/25.    Height: '6\' 3"'$  (190.5 cm) Weight: 90.3 kg (199 lb) IBW/kg (Calculated) : 84.5  Temp (24hrs), Avg:98.1 F (36.7 C), Min:97.8 F (36.6 C), Max:98.5 F (36.9 C)  Recent Labs  Lab 02/13/23 1422 02/13/23 1832 02/14/23 0400 02/15/23 0439  WBC 14.8*  --  14.0* 13.1*  CREATININE 0.82  --  0.81 0.78  LATICACIDVEN  --  1.0  --   --      Estimated Creatinine Clearance: 168.7 mL/min (by C-G formula based on SCr of 0.78 mg/dL).    Allergies  Allergen Reactions   Amoxicillin Hives    Has patient had a PCN reaction causing immediate rash, facial/tongue/throat swelling, SOB or lightheadedness with hypotension: No Has patient had a PCN reaction causing severe rash involving mucus membranes or skin necrosis: No Has patient had a PCN reaction that required hospitalization: No Has patient had a PCN reaction occurring within the last 10 years: No If all of the above answers are "NO", then may proceed with Cephalosporin use.    Lexapro  [Escitalopram] Other (See Comments)    Thank you for allowing pharmacy to be part of this patients care team. Nicole Cella, RPh Clinical Pharmacist  02/16/2023 5:00 PM

## 2023-02-16 NOTE — Progress Notes (Signed)
PODIATRY PROGRESS NOTE Patient Name: [name]  DOB March 30, 1996 DOA 02/13/2023  Hospital Day: 4  Assessment:  27 y.o. male with hx DM type 1 with peripheral neuropathy, PAD, HTN HLD, R foot chronic ulceration now with worsening R foot infection, concern for abscess and osteomyelitis of the 4th met head and base of proximal phalanx on MRI.  AF, VSS  WBC: p  Wound/Bone Cultures: Staph aureus  Imaging: MRI R foot W WO contrast New soft tissue edema, swelling, and draining sinus tracts dorsal and lateral to the distal metatarsal shaft through the proximal phalanx of the fourth digit. This includes a walled-off pocket of fluid suspicious for an abscess measuring up to 27 mm along the long axis of the foot. There is marrow edema within the fourth metatarsal head and trace marrow edema within the proximal plantar aspect of the 4th proximal phalanx suspicious for acute osteomyelitis.  Plan:  - s/p R 4th met head resection, abx beads, closure of wounds. Dsg changed and packing pulled. Mild improvement at surgical site.  Will continue to monitor for improvement in erythema tmrw with dressing change. May require return to OR pending clinical course.  - Continue IV abx broad spectrum, OR cx S aureus  - Dressing to remain intact until tmrw. - WBAT in surgical shoe to heel right foot Will continue to follow        Everitt Amber, [place]    Subjective:  Pt seen bedside, discussed findings from OR yesterday. No pain. Denies n/v/f/C. Aware of plans for repeat dsg change tomorrow and determine if needed to have repeat washout this admission.  Objective:   Vitals:   02/16/23 0417 02/16/23 0725  BP: 112/76 113/74  Pulse: 98 99  Resp: 18 18  Temp: 97.9 F (36.6 C) 98 F (36.7 C)  SpO2: 99% 99%       Latest Ref Rng & Units 02/15/2023    4:39 AM 02/14/2023    4:00 AM 02/13/2023    2:22 PM  CBC  WBC 4.0 - 10.5 K/uL 13.1  14.0  14.8   Hemoglobin 13.0 -  17.0 g/dL 14.7  14.8  15.7   Hematocrit 39.0 - 52.0 % 42.7  43.1  48.1   Platelets 150 - 400 K/uL 317  318  370        Latest Ref Rng & Units 02/15/2023    4:39 AM 02/14/2023    4:00 AM 02/13/2023    2:22 PM  BMP  Glucose 70 - 99 mg/dL 112  63  92   BUN 6 - 20 mg/dL '15  16  17   '$ Creatinine 0.61 - 1.24 mg/dL 0.78  0.81  0.82   Sodium 135 - 145 mmol/L 136  137  137   Potassium 3.5 - 5.1 mmol/L 3.4  3.8  4.5   Chloride 98 - 111 mmol/L 102  101  99   CO2 22 - 32 mmol/L '25  27  25   '$ Calcium 8.9 - 10.3 mg/dL 9.2  9.4  10.0     General: AAOx3, NAD  Lower Extremity Exam Vasc: R - PT palpable, DP palpable. Cap refill < 3 sec to digits  L - PT palpable, DP palpable. Cap refill <3 sec to digits  Derm: R - Still with residual erythema at 4th MPJ slightly improved from prior, no extending erythema. Sanguinous drainage. No purulence expressed.  Dorsal incision well coapted.  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 Gross motor function intact   L - Gross sensation absent. Gross motor function intact   Radiology:  Results reviewed. See assessment for pertinent imaging results

## 2023-02-17 DIAGNOSIS — M86171 Other acute osteomyelitis, right ankle and foot: Secondary | ICD-10-CM | POA: Diagnosis not present

## 2023-02-17 LAB — BASIC METABOLIC PANEL
Anion gap: 14 (ref 5–15)
BUN: 10 mg/dL (ref 6–20)
CO2: 21 mmol/L — ABNORMAL LOW (ref 22–32)
Calcium: 9.2 mg/dL (ref 8.9–10.3)
Chloride: 101 mmol/L (ref 98–111)
Creatinine, Ser: 0.77 mg/dL (ref 0.61–1.24)
GFR, Estimated: >60 mL/min (ref >60)
Glucose, Bld: 82 mg/dL (ref 70–99)
Potassium: 3.7 mmol/L (ref 3.5–5.1)
Sodium: 136 mmol/L (ref 135–145)

## 2023-02-17 LAB — GLUCOSE, CAPILLARY
Glucose-Capillary: 107 mg/dL — ABNORMAL HIGH (ref 70–99)
Glucose-Capillary: 124 mg/dL — ABNORMAL HIGH (ref 70–99)
Glucose-Capillary: 115 mg/dL — ABNORMAL HIGH (ref 70–99)
Glucose-Capillary: 98 mg/dL (ref 70–99)

## 2023-02-17 LAB — CBC
HCT: 42.2 % (ref 39.0–52.0)
Hemoglobin: 14.4 g/dL (ref 13.0–17.0)
MCH: 29.3 pg (ref 26.0–34.0)
MCHC: 34.1 g/dL (ref 30.0–36.0)
MCV: 85.9 fL (ref 80.0–100.0)
Platelets: 294 10*3/uL (ref 150–400)
RBC: 4.91 MIL/uL (ref 4.22–5.81)
RDW: 12.2 % (ref 11.5–15.5)
WBC: 14.2 10*3/uL — ABNORMAL HIGH (ref 4.0–10.5)
nRBC: 0 % (ref 0.0–0.2)

## 2023-02-17 LAB — VANCOMYCIN, PEAK: Vancomycin Pk: 25 ug/mL — ABNORMAL LOW (ref 30–40)

## 2023-02-17 LAB — VANCOMYCIN, TROUGH: Vancomycin Tr: 10 ug/mL — ABNORMAL LOW (ref 15–20)

## 2023-02-17 NOTE — Progress Notes (Signed)
ID Brief Note OR Cx+ MRSA D/C ceftriaxone, continue vanc+ metro

## 2023-02-17 NOTE — Progress Notes (Signed)
Pharmacy Antibiotic Note  [name] is a 27 y.o. male admitted on 02/13/2023 with  diabetic foot infection .  Pharmacy has been consulted for Vancomycin dosing. WBC 14>13.1>14.2. Renal function stable.  Vancomycin AUC is therapeutic at 440  Cultures from 2/21 and 2/23 MRSA+  Plan: Cont vancomycin 1250 mg IV q12h Ceftriaxone DC'd by ID Continues on Flagyl Re-check vancomycin levels as needed   Height: '6\' 3"'$  (190.5 cm) Weight: 90.3 kg (199 lb) IBW/kg (Calculated) : 84.5  Temp (24hrs), Avg:98 F (36.7 C), Min:97.7 F (36.5 C), Max:98.4 F (36.9 C)  Recent Labs  Lab 02/13/23 1422 02/13/23 1832 02/14/23 0400 02/15/23 0439 02/17/23 0347 02/17/23 1310 02/17/23 2242  WBC 14.8*  --  14.0* 13.1* 14.2*  --   --   CREATININE 0.82  --  0.81 0.78 0.77  --   --   LATICACIDVEN  --  1.0  --   --   --   --   --   VANCOTROUGH  --   --   --   --   --   --  10*  VANCOPEAK  --   --   --   --   --  25*  --     Estimated Creatinine Clearance: 168.7 mL/min (by C-G formula based on SCr of 0.77 mg/dL).    Allergies  Allergen Reactions   Amoxicillin Hives    Has patient had a PCN reaction causing immediate rash, facial/tongue/throat swelling, SOB or lightheadedness with hypotension: No Has patient had a PCN reaction causing severe rash involving mucus membranes or skin necrosis: No Has patient had a PCN reaction that required hospitalization: No Has patient had a PCN reaction occurring within the last 10 years: No If all of the above answers are "NO", then may proceed with Cephalosporin use.    Lexapro [Escitalopram] Other (See Comments)    Narda Bonds, PharmD, [facility] Clinical Pharmacist Phone: [phone]

## 2023-02-17 NOTE — Progress Notes (Signed)
PODIATRY PROGRESS NOTE Patient Name: [name]  DOB September 18, 1996 DOA 02/13/2023  Hospital Day: 5  Assessment:  27 y.o. male with hx DM type 1 with peripheral neuropathy, PAD, HTN HLD, R foot chronic ulceration now with worsening R foot infection, concern for abscess and osteomyelitis of the 4th met head and base of proximal phalanx on MRI.  POD#2 s/p R foot 4th Met head resection and I&D  AF, VSS  WBC: 14.1  Wound/Bone Cultures: Staph aureus  Imaging: MRI R foot W WO contrast New soft tissue edema, swelling, and draining sinus tracts dorsal and lateral to the distal metatarsal shaft through the proximal phalanx of the fourth digit. This includes a walled-off pocket of fluid suspicious for an abscess measuring up to 27 mm along the long axis of the foot. There is marrow edema within the fourth metatarsal head and trace marrow edema within the proximal plantar aspect of the 4th proximal phalanx suspicious for acute osteomyelitis.  Plan:  - s/p R 4th met head resection, abx beads, closure of wounds. Dsg changed and removed sutures from plantar wound closure. Used sterile saline flushes x 5 to flush the wound from dorsal to plantar. Opened up the site with sterile scissors. Placed packing material betadine gauze in plantar wound. continue to monitor for improvement in erythema tmrw with dressing change. May require return to OR pending clinical course. Though clinically improved today, would like to see improvement in white count tmrw otherwise will need return to OR. - NPO at [facility] tonight for possible RTOR tmrw. - Continue IV abx Vanc, metronidazole, OR cx S aureus , Appreciate ID recs - Dressing to remain intact until tmrw. - WBAT in surgical shoe to heel right foot Will continue to follow        Everitt Amber, [place]    Subjective:  Pt seen bedside, discussed elevated white count and possible need for return to OR. He says the foot is feeling  better with no pain when pressing on the area.   Objective:   Vitals:   02/16/23 1920 02/17/23 0357  BP: 122/77 115/75  Pulse: 100 90  Resp: 18 17  Temp: 98.6 F (37 C)   SpO2: 99% 100%       Latest Ref Rng & Units 02/17/2023    3:47 AM 02/15/2023    4:39 AM 02/14/2023    4:00 AM  CBC  WBC 4.0 - 10.5 K/uL 14.2  13.1  14.0   Hemoglobin 13.0 - 17.0 g/dL 14.4  14.7  14.8   Hematocrit 39.0 - 52.0 % 42.2  42.7  43.1   Platelets 150 - 400 K/uL 294  317  318        Latest Ref Rng & Units 02/17/2023    3:47 AM 02/15/2023    4:39 AM 02/14/2023    4:00 AM  BMP  Glucose 70 - 99 mg/dL 82  112  63   BUN 6 - 20 mg/dL '10  15  16   '$ Creatinine 0.61 - 1.24 mg/dL 0.77  0.78  0.81   Sodium 135 - 145 mmol/L 136  136  137   Potassium 3.5 - 5.1 mmol/L 3.7  3.4  3.8   Chloride 98 - 111 mmol/L 101  102  101   CO2 22 - 32 mmol/L '21  25  27   '$ Calcium 8.9 - 10.3 mg/dL 9.2  9.2  9.4     General: AAOx3, NAD  Lower Extremity Exam Vasc: R - PT palpable, DP palpable. Cap refill < 3 sec to digits  L - PT palpable, DP palpable. Cap refill <3 sec to digits  Derm: R -  Decreasing erythema at 4th MPJ slightly improved from prior, no extending erythema. Sanguinous drainage. No purulence expressed.  Dorsal incision well coapted. Decreased tenderness from prior no pain this am with palpation. 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 Gross motor function intact   L - Gross sensation absent. Gross motor function intact   Radiology:  Results reviewed. See assessment for pertinent imaging results

## 2023-02-17 NOTE — Progress Notes (Signed)
PROGRESS NOTE    [name]  [id number] DOB: 06-Jun-1996 DOA: 02/13/2023 PCP: Danna Hefty, DO    Brief Narrative:   [name] is a 27 y.o. 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  On Tuesday, patient had a total contact cast placed on his right lower extremity.  Patient reports started having pain in the right foot roughly 2 days later.  He went to his wound care specialist who remove the cast with notable significant erythema, swelling and purulent discharge; and patient was directed to the ED for possible debridement and IV antibiotics. MR right foot with soft tissue edema, swelling, draining sinus tracts dorsal/lateral to the distal metatarsal shaft through the proximal phalanx of the fourth digit, walled off pocket of fluid consistent with abscess measuring 27 mm, marrow edema within the fourth metatarsal head and trace marrow edema within the proximal plantar aspect of the fourth proximal phalanx suspicious for acute osteomyelitis.  Podiatry was consulted. went to the OR on 2/23.  Assessment & Plan:   Left foot diabetic infection with osteomyelitis/abscess Previously had fifth ray amputation by podiatry.  MR left foot with findings consistent with abscess, acute osteomyelitis. -- Podiatry consulted, s/poperative management on 2/23 -- Vancomycin, pharmacy consulted for dosing/monitoring- MRSA swab + -- Metronidazole 5 mg IV every 12 hours -culture of bone showing staph a-- appreciate ID consult -back to the OR tomorrow   Diabetes mellitus type 1.5 Follows with endocrinology outpatient.  Home regimen includes Farxiga 10 mg p.o. daily, Lantus 26 units [place] BID.  Last hemoglobin A1c 8.4 on 11/14/2022, not optimally controlled. -- Semglee 10u [place] BID -- Novolog moderate SSI for  coverage  Essential hypertension -- Metoprolol succinate 100 mg PO daily  HLD -- Atorvastatin 10 mg p.o. daily  DVT prophylaxis: enoxaparin (LOVENOX) injection 40 mg Start: 02/13/23 2245    Code Status: Full Code Family Communication: No family present at bedside this morning  Disposition Plan:  Level of care: Med-Surg Status is: Inpatient Remains inpatient appropriate because: Pending surgical invention for acute osteomyelitis    Consultants:  Podiatry ID   Subjective: No overnight events  Objective: Vitals:   02/16/23 1512 02/16/23 1920 02/17/23 0357 02/17/23 0756  BP: 113/67 122/77 115/75 119/77  Pulse: 97 100 90 100  Resp: '17 18 17 15  '$ Temp: 98.5 F (36.9 C) 98.6 F (37 C)  98.4 F (36.9 C)  TempSrc: Oral Oral  Oral  SpO2: 100% 99% 100% 98%  Weight:      Height:        Intake/Output Summary (Last 24 hours) at 02/17/2023 1256 Last data filed at 02/17/2023 0900 Gross per 24 hour  Intake 1513.42 ml  Output 1400 ml  Net 113.42 ml   Filed Weights   02/13/23 2000 02/13/23 2223 02/15/23 1309  Weight: 85.7 kg 93.6 kg 90.3 kg    Examination:  Physical Exam:   General: Appearance:    Well developed, well nourished male in no acute distress     Lungs:      respirations unlabored  Heart:    Tachycardic. Normal rhythm.    MS:   Right  amputation noted.   Neurologic:   Awake, alert, oriented x 3. No apparent focal neurological           defect.  Data Reviewed: I have personally reviewed following labs and imaging studies  CBC: Recent Labs  Lab 02/13/23 1422 02/14/23 0400 02/15/23 0439 02/17/23 0347  WBC 14.8* 14.0* 13.1* 14.2*  NEUTROABS  --  8.9*  --   --   HGB 15.7 14.8 14.7 14.4  HCT 48.1 43.1 42.7 42.2  MCV 88.6 86.5 85.4 85.9  PLT 370 318 317 [id number]   Basic Metabolic Panel: Recent Labs  Lab 02/13/23 1422 02/14/23 0400 02/15/23 0439 02/17/23 0347  NA 137 137 136 136  K 4.5 3.8 3.4* 3.7  CL 99 101 102 101  CO2 '25 27 25 '$ 21*   GLUCOSE 92 63* 112* 82  BUN '17 16 15 10  '$ CREATININE 0.82 0.81 0.78 0.77  CALCIUM 10.0 9.4 9.2 9.2  MG  --  2.2 2.0  --    GFR: Estimated Creatinine Clearance: 168.7 mL/min (by C-G formula based on SCr of 0.77 mg/dL). Liver Function Tests: No results for input(s): "AST", "ALT", "ALKPHOS", "BILITOT", "PROT", "ALBUMIN" in the last 168 hours. No results for input(s): "LIPASE", "AMYLASE" in the last 168 hours. No results for input(s): "AMMONIA" in the last 168 hours. Coagulation Profile: No results for input(s): "INR", "PROTIME" in the last 168 hours. Cardiac Enzymes: No results for input(s): "CKTOTAL", "CKMB", "CKMBINDEX", "TROPONINI" in the last 168 hours. BNP (last 3 results) No results for input(s): "PROBNP" in the last 8760 hours. HbA1C: Recent Labs    02/15/23 0439  HGBA1C 6.0*   CBG: Recent Labs  Lab 02/16/23 1138 02/16/23 1611 02/16/23 2131 02/17/23 0757 02/17/23 1206  GLUCAP 88 169* 99 107* 124*   Lipid Profile: No results for input(s): "CHOL", "HDL", "LDLCALC", "TRIG", "CHOLHDL", "LDLDIRECT" in the last 72 hours. Thyroid Function Tests: No results for input(s): "TSH", "T4TOTAL", "FREET4", "T3FREE", "THYROIDAB" in the last 72 hours. Anemia Panel: No results for input(s): "VITAMINB12", "FOLATE", "FERRITIN", "TIBC", "IRON", "RETICCTPCT" in the last 72 hours. Sepsis Labs: Recent Labs  Lab 02/13/23 1832  LATICACIDVEN 1.0    Recent Results (from the past 240 hour(s))  Aerobic Culture w Gram Stain (superficial specimen)     Status: None   Collection Time: 02/13/23 12:30 PM   Specimen: Wound  Result Value Ref Range Status   Specimen Description   Final    WOUND RIGHT FOOT Performed at [facility] [address], [place], [place] [address]    Special Requests   Final    NONE Performed at [facility], [place] [address] [place], [place] [address]    Gram Stain   Final    RARE WBC PRESENT, PREDOMINANTLY PMN MODERATE GRAM  POSITIVE COCCI Performed at [facility], [place] [address] [place], [place] [address]    Culture   Final    ABUNDANT METHICILLIN RESISTANT STAPHYLOCOCCUS AUREUS   Report Status 02/16/2023 FINAL  Final   Organism ID, Bacteria METHICILLIN RESISTANT STAPHYLOCOCCUS AUREUS  Final      Susceptibility   Methicillin resistant staphylococcus aureus - MIC*    CIPROFLOXACIN >=8 RESISTANT Resistant     ERYTHROMYCIN >=8 RESISTANT Resistant     GENTAMICIN <=0.5 SENSITIVE Sensitive     OXACILLIN >=4 RESISTANT Resistant     TETRACYCLINE <=1 SENSITIVE Sensitive     VANCOMYCIN 1 SENSITIVE Sensitive     TRIMETH/SULFA <=10 SENSITIVE Sensitive     CLINDAMYCIN <=0.25 SENSITIVE Sensitive     RIFAMPIN <=0.5 SENSITIVE Sensitive     Inducible Clindamycin NEGATIVE Sensitive     * ABUNDANT METHICILLIN  RESISTANT STAPHYLOCOCCUS AUREUS  Blood culture (routine x 2)     Status: None (Preliminary result)   Collection Time: 02/13/23  6:32 PM   Specimen: BLOOD LEFT HAND  Result Value Ref Range Status   Specimen Description BLOOD LEFT HAND  Final   Special Requests   Final    BOTTLES DRAWN AEROBIC AND ANAEROBIC Blood Culture results may not be optimal due to an excessive volume of blood received in culture bottles   Culture   Final    NO GROWTH 4 DAYS Performed at [facility], [place] [address], [place], [place] [address]    Report Status PENDING  Incomplete  Blood culture (routine x 2)     Status: None (Preliminary result)   Collection Time: 02/13/23  6:32 PM   Specimen: BLOOD RIGHT WRIST  Result Value Ref Range Status   Specimen Description BLOOD RIGHT WRIST  Final   Special Requests   Final    BOTTLES DRAWN AEROBIC AND ANAEROBIC Blood Culture results may not be optimal due to an inadequate volume of blood received in culture bottles   Culture   Final    NO GROWTH 4 DAYS Performed at [facility], [place] [address], [place], [place] [address]    Report Status PENDING  Incomplete  MRSA Next Gen  by PCR, Nasal     Status: Abnormal   Collection Time: 02/14/23 10:04 AM   Specimen: Nasal Mucosa; Nasal Swab  Result Value Ref Range Status   MRSA by PCR Next Gen DETECTED (A) NOT DETECTED Final    Comment: RESULT CALLED TO, READ BACK BY AND VERIFIED WITH: RN TAKENYA BURTON ON 02/14/23 @ 2254 BY DRT (NOTE) The GeneXpert MRSA Assay (FDA approved for NASAL specimens only), is one component of a comprehensive MRSA colonization surveillance program. It is not intended to diagnose MRSA infection nor to guide or monitor treatment for MRSA infections. Test performance is not FDA approved in patients less than 55 years old. Performed at [facility], [place] [address] [place], [place] [address]   Aerobic/Anaerobic Culture w Gram Stain (surgical/deep wound)     Status: None (Preliminary result)   Collection Time: 02/15/23  3:17 PM   Specimen: PATH Bone biopsy; Tissue  Result Value Ref Range Status   Specimen Description BONE  Final   Special Requests 4TH METATARSAL HEAD  Final   Gram Stain NO WBC SEEN NO ORGANISMS SEEN   Final   Culture   Final    NO GROWTH 2 DAYS NO ANAEROBES ISOLATED; CULTURE IN PROGRESS FOR 5 DAYS Performed at [facility], [address] [place], [place] [address]    Report Status PENDING  Incomplete  Aerobic/Anaerobic Culture w Gram Stain (surgical/deep wound)     Status: None (Preliminary result)   Collection Time: 02/15/23  3:23 PM   Specimen: PATH Bone biopsy; Tissue  Result Value Ref Range Status   Specimen Description BONE  Final   Special Requests SWABS OF WOUND  Final   Gram Stain   Final    NO WBC SEEN RARE GRAM POSITIVE COCCI IN PAIRS Performed at [facility], [address], [place], [place] [address]    Culture   Final    RARE METHICILLIN RESISTANT STAPHYLOCOCCUS AUREUS NO ANAEROBES ISOLATED; CULTURE IN PROGRESS FOR 5 DAYS    Report Status PENDING  Incomplete   Organism ID, Bacteria METHICILLIN RESISTANT STAPHYLOCOCCUS AUREUS   Final      Susceptibility  Methicillin resistant staphylococcus aureus - MIC*    CIPROFLOXACIN >=8 RESISTANT Resistant     ERYTHROMYCIN >=8 RESISTANT Resistant     GENTAMICIN <=0.5 SENSITIVE Sensitive     OXACILLIN >=4 RESISTANT Resistant     TETRACYCLINE <=1 SENSITIVE Sensitive     VANCOMYCIN 1 SENSITIVE Sensitive     TRIMETH/SULFA <=10 SENSITIVE Sensitive     CLINDAMYCIN <=0.25 SENSITIVE Sensitive     RIFAMPIN <=0.5 SENSITIVE Sensitive     Inducible Clindamycin NEGATIVE Sensitive     * RARE METHICILLIN RESISTANT STAPHYLOCOCCUS AUREUS         Radiology Studies: DG Foot Complete Right  Result Date: 02/15/2023 CLINICAL DATA:  [id number] Post-operative state [id number] EXAM: RIGHT FOOT COMPLETE - 3+ VIEW COMPARISON:  Radiograph 02/13/2023 FINDINGS: Postsurgical changes of fourth metatarsal head resection with antibiotic bead placement. Prior partial fifth ray amputation. Adjacent soft tissue swelling in soft tissue defect. IMPRESSION: Postsurgical changes of fourth metatarsal head resection with antibiotic bead placement. Electronically Signed   By: Maurine Simmering M.D.   On: 02/15/2023 16:59        Scheduled Meds:  atorvastatin  10 mg Oral Daily   busPIRone  5 mg Oral BID   DULoxetine  30 mg Oral Daily   enoxaparin (LOVENOX) injection  40 mg Subcutaneous QHS   insulin aspart  0-15 Units Subcutaneous TID WC   insulin aspart  0-5 Units Subcutaneous QHS   insulin glargine-yfgn  10 Units Subcutaneous BID   metoprolol succinate  100 mg Oral QHS   mupirocin ointment  1 Application Nasal BID   zolpidem  10 mg Oral QHS   Continuous Infusions:  sodium chloride 10 mL/hr at 02/17/23 0732   cefTRIAXone (ROCEPHIN)  IV Stopped (02/16/23 2156)   metronidazole 500 mg (02/17/23 0801)   vancomycin 1,250 mg (02/17/23 1012)     LOS: 4 days    Time spent: 52 minutes spent on chart review, discussion with nursing staff, consultants, updating family and interview/physical exam; more than 50% of that  time was spent in counseling and/or coordination of care.    Geradine Girt, DO [facility] Available via Epic secure chat 7am-7pm After these hours, please refer to coverage provider listed on [email or web address] 02/17/2023, 12:56 PM

## 2023-02-18 ENCOUNTER — Encounter (HOSPITAL_COMMUNITY): Payer: Self-pay | Admitting: Podiatry

## 2023-02-18 ENCOUNTER — Encounter (HOSPITAL_COMMUNITY)
Admission: EM | Disposition: A | Discharge status: Home Health | Payer: Self-pay | Source: Home / Self Care | Attending: Internal Medicine

## 2023-02-18 DIAGNOSIS — M86171 Other acute osteomyelitis, right ankle and foot: Secondary | ICD-10-CM | POA: Diagnosis not present

## 2023-02-18 LAB — BASIC METABOLIC PANEL
Anion gap: 10 (ref 5–15)
BUN: 11 mg/dL (ref 6–20)
CO2: 25 mmol/L (ref 22–32)
Calcium: 9.3 mg/dL (ref 8.9–10.3)
Chloride: 101 mmol/L (ref 98–111)
Creatinine, Ser: 0.78 mg/dL (ref 0.61–1.24)
GFR, Estimated: >60 mL/min (ref >60)
Glucose, Bld: 98 mg/dL (ref 70–99)
Potassium: 3.6 mmol/L (ref 3.5–5.1)
Sodium: 136 mmol/L (ref 135–145)

## 2023-02-18 LAB — CULTURE, BLOOD (ROUTINE X 2)
Culture: NO GROWTH
Culture: NO GROWTH

## 2023-02-18 LAB — CBC
HCT: 40.3 % (ref 39.0–52.0)
Hemoglobin: 13.9 g/dL (ref 13.0–17.0)
MCH: 29.3 pg (ref 26.0–34.0)
MCHC: 34.5 g/dL (ref 30.0–36.0)
MCV: 85 fL (ref 80.0–100.0)
Platelets: 336 10*3/uL (ref 150–400)
RBC: 4.74 MIL/uL (ref 4.22–5.81)
RDW: 12.2 % (ref 11.5–15.5)
WBC: 13.5 10*3/uL — ABNORMAL HIGH (ref 4.0–10.5)
nRBC: 0 % (ref 0.0–0.2)

## 2023-02-18 LAB — GLUCOSE, CAPILLARY
Glucose-Capillary: 121 mg/dL — ABNORMAL HIGH (ref 70–99)
Glucose-Capillary: 122 mg/dL — ABNORMAL HIGH (ref 70–99)
Glucose-Capillary: 153 mg/dL — ABNORMAL HIGH (ref 70–99)
Glucose-Capillary: 97 mg/dL (ref 70–99)

## 2023-02-18 SURGERY — IRRIGATION AND DEBRIDEMENT WOUND
Anesthesia: Choice | Laterality: Right

## 2023-02-18 MED ORDER — DEXTROSE-NACL 5-0.45 % IV SOLN: INTRAVENOUS | Status: DC

## 2023-02-18 NOTE — Progress Notes (Incomplete)
PHARMACY CONSULT NOTE FOR:  OUTPATIENT  PARENTERAL ANTIBIOTIC THERAPY (OPAT)  Indication: MRSA DFI/osteo Regimen: Daptomycin 700 mg IV every 24 hours End date: 03/29/23 (6 weeks from OR 02/15/23)  IV antibiotic discharge orders are pended. To discharging provider:  please sign these orders via discharge navigator,  Select New Orders & click on the button choice - Manage This Unsigned Work.    Thank you for allowing pharmacy to be a part of this patient's care.  Alycia Rossetti, PharmD, BCPS Infectious Diseases Clinical Pharmacist 02/19/2023 10:08 AM   **Pharmacist phone directory can now be found on [email or web address] ([email or web address]).  Listed under [place].

## 2023-02-18 NOTE — Progress Notes (Addendum)
Subjective:  Patient ID: [name], male    DOB: 06/20/1996,  MRN: [id number]  Patient seen at bedside this afternoon POD#3 s/p right foot 4th metatarsal head resection and I&D. Relates doing well. Not having any pain and relates his swelling has gone down. States he has been walking in the surgical shoe in his room.   Past Medical History:  Diagnosis Date   Complication of anesthesia    Hard to wake up when he was younger once.   Diabetes mellitus, type II ([place])    Heart murmur    Hypertension      Past Surgical History:  Procedure Laterality Date   AMPUTATION Right 05/21/2021   Procedure: AMPUTATION RAY 5th;  Surgeon: Trula Slade, DPM;  Location: [facility];  Service: Podiatry;  Laterality: Right;   BONE BIOPSY Right 02/15/2023   Procedure: BONE BIOPSY;  Surgeon: Yevonne Pax, DPM;  Location: [facility];  Service: Podiatry;  Laterality: Right;   INCISION AND DRAINAGE Right 02/15/2023   Procedure: INCISION AND DRAINAGE OF RIGHT FOOT WITH FOURTH TOE HEAD RESECTION AND STIMULAN BEADS;  Surgeon: Yevonne Pax, DPM;  Location: [facility] [place];  Service: Podiatry;  Laterality: Right;   PALATE / UVULA BIOPSY / EXCISION     "growth removed"   UPPER GASTROINTESTINAL ENDOSCOPY     Done in [place] - 2013 ish   WOUND DEBRIDEMENT Right 05/24/2021   Procedure: RIGHT FOOT WOUND DEBRIDEMENT AND CLOSURE;  Surgeon: Evelina Bucy, DPM;  Location: [facility];  Service: Podiatry;  Laterality: Right;       Latest Ref Rng & Units 02/18/2023    2:56 AM 02/17/2023    3:47 AM 02/15/2023    4:39 AM  CBC  WBC 4.0 - 10.5 K/uL 13.5  14.2  13.1   Hemoglobin 13.0 - 17.0 g/dL 13.9  14.4  14.7   Hematocrit 39.0 - 52.0 % 40.3  42.2  42.7   Platelets 150 - 400 K/uL 336  294  317        Latest Ref Rng & Units 02/18/2023    2:56 AM 02/17/2023    3:47 AM 02/15/2023    4:39 AM  BMP  Glucose 70 - 99 mg/dL 98  82  112   BUN 6 - 20 mg/dL '11  10  15   '$ Creatinine 0.61 - 1.24 mg/dL 0.78  0.77  0.78    Sodium 135 - 145 mmol/L 136  136  136   Potassium 3.5 - 5.1 mmol/L 3.6  3.7  3.4   Chloride 98 - 111 mmol/L 101  101  102   CO2 22 - 32 mmol/L '25  21  25   '$ Calcium 8.9 - 10.3 mg/dL 9.3  9.2  9.2      Objective:   Vitals:   02/18/23 0307 02/18/23 0812  BP: 121/75 117/75  Pulse: 88 97  Resp: 16 18  Temp:  97.7 F (36.5 C)  SpO2: 99% 98%    General:AA&O x 3. Normal mood and affect   Vascular: DP and PT pulses 2/4 bilateral. Brisk capillary refill to all digits. Pedal hair present   Neruological. Epicritic sensation grossly intact.   Derm: Ladean Raya right foot incision with some mild maceration but well coapted. Erythema does appear to be improved. Plantar incision open and no surrounding erythema noted.   MSK: MMT 5/5 in dorsiflexion, plantar flexion, inversion and eversion. Normal joint ROM without pain or crepitus.       Assessment &  Plan:  Patient was evaluated and treated and all questions answered.  DX: s/p right fourth metatarsal head resection with abx beads and I&D.  Wound care: dressing changed and repacked today at bedside with sterile saline soaked gauze, DSD  Antibiotics: Continue IV abx and appreciate ID recommendatiosn.   Heel WBAT in surgical shoe  WBC 13.5 Based on clinical improvement and drop in WBC will hold off on repeat washout today. Recommend continued IV abx and wound care follow-up.  Patient is ok for discharge from podiatry standpoint pending ID recommendations. No  surgical plans at this time.  Will follow-up one week from discharge in our clinic and continue follow-up in wound care clinic.  Patient in agreement with plan and all questions answered.  Will follow peripherally while in house.    Lorenda Peck, DPM  Accessible via secure chat for questions or concerns.

## 2023-02-18 NOTE — Progress Notes (Signed)
PROGRESS NOTE    [name]  [id number] DOB: 1996-07-19 DOA: 02/13/2023 PCP: Danna Hefty, DO    Brief Narrative:   [name] is a 27 y.o. male with past medical history significant for diabetes mellitus type 1.5, PAD, HTN, HLD, history of osteomyelitis s/p fifth ray amputation May 2022 who presented to [facility] ED on 2/21 with progressive right foot infection.  On Tuesday, patient had a total contact cast placed on his right lower extremity.  Patient reports started having pain in the right foot roughly 2 days later.  He went to his wound care specialist who remove the cast with notable significant erythema, swelling and purulent discharge; and patient was directed to the ED for possible debridement and IV antibiotics. MR right foot with soft tissue edema, swelling, draining sinus tracts dorsal/lateral to the distal metatarsal shaft through the proximal phalanx of the fourth digit, walled off pocket of fluid consistent with abscess measuring 27 mm, marrow edema within the fourth metatarsal head and trace marrow edema within the proximal plantar aspect of the fourth proximal phalanx suspicious for acute osteomyelitis.  Podiatry was consulted. went to the OR on 2/23-- plan to return on 2/26.    Assessment & Plan:   Left foot diabetic infection with osteomyelitis/abscess Previously had fifth ray amputation by podiatry.  MR left foot with findings consistent with abscess, acute osteomyelitis. -- Podiatry consulted, s/poperative management on 2/23 -- Vancomycin, pharmacy consulted for dosing/monitoring- MRSA swab + -- Metronidazole 5 mg IV every 12 hours -culture of bone showing staph a-- appreciate ID consult -back to the OR 2/26  Diabetes mellitus type 1.5 Follows with endocrinology outpatient.  Home regimen includes Farxiga 10 mg p.o. daily, Lantus 26 units [place] BID.  Last hemoglobin A1c 8.4 on 11/14/2022, not optimally controlled. -- Semglee 10u [place] BID -- Novolog moderate  SSI for coverage  Essential hypertension -- Metoprolol succinate 100 mg PO daily  HLD -- Atorvastatin 10 mg p.o. daily  DVT prophylaxis: enoxaparin (LOVENOX) injection 40 mg Start: 02/13/23 2245    Code Status: Full Code Family Communication: No family present at bedside this morning  Disposition Plan:  Level of care: Med-Surg Status is: Inpatient Remains inpatient appropriate because: Pending surgical invention for acute osteomyelitis    Consultants:  Podiatry ID   Subjective: NPO for planned procedure  Objective: Vitals:   02/17/23 1530 02/17/23 1911 02/18/23 0307 02/18/23 0812  BP: 120/83 123/75 121/75 117/75  Pulse: 94 (!) 103 88 97  Resp: '19 18 16 18  '$ Temp: 98 F (36.7 C) 97.7 F (36.5 C)  97.7 F (36.5 C)  TempSrc:  Oral  Oral  SpO2: 100% 100% 99% 98%  Weight:      Height:        Intake/Output Summary (Last 24 hours) at 02/18/2023 1006 Last data filed at 02/17/2023 1857 Gross per 24 hour  Intake 750.87 ml  Output 600 ml  Net 150.87 ml   Filed Weights   02/13/23 2000 02/13/23 2223 02/15/23 1309  Weight: 85.7 kg 93.6 kg 90.3 kg    Examination:  Physical Exam:   General: Appearance:    Well developed, well nourished male in no acute distress     Lungs:      respirations unlabored  Heart:    Normal heart rate. Normal rhythm.    MS:   Right partial foot amputation noted.   Neurologic:   Awake, alert      Data Reviewed: I have personally reviewed following  labs and imaging studies  CBC: Recent Labs  Lab 02/13/23 1422 02/14/23 0400 02/15/23 0439 02/17/23 0347 02/18/23 0256  WBC 14.8* 14.0* 13.1* 14.2* 13.5*  NEUTROABS  --  8.9*  --   --   --   HGB 15.7 14.8 14.7 14.4 13.9  HCT 48.1 43.1 42.7 42.2 40.3  MCV 88.6 86.5 85.4 85.9 85.0  PLT 370 318 317 294 [id number]   Basic Metabolic Panel: Recent Labs  Lab 02/13/23 1422 02/14/23 0400 02/15/23 0439 02/17/23 0347 02/18/23 0256  NA 137 137 136 136 136  K 4.5 3.8 3.4* 3.7 3.6  CL 99 101  102 101 101  CO2 '25 27 25 '$ 21* 25  GLUCOSE 92 63* 112* 82 98  BUN '17 16 15 10 11  '$ CREATININE 0.82 0.81 0.78 0.77 0.78  CALCIUM 10.0 9.4 9.2 9.2 9.3  MG  --  2.2 2.0  --   --    GFR: Estimated Creatinine Clearance: 168.7 mL/min (by C-G formula based on SCr of 0.78 mg/dL). Liver Function Tests: No results for input(s): "AST", "ALT", "ALKPHOS", "BILITOT", "PROT", "ALBUMIN" in the last 168 hours. No results for input(s): "LIPASE", "AMYLASE" in the last 168 hours. No results for input(s): "AMMONIA" in the last 168 hours. Coagulation Profile: No results for input(s): "INR", "PROTIME" in the last 168 hours. Cardiac Enzymes: No results for input(s): "CKTOTAL", "CKMB", "CKMBINDEX", "TROPONINI" in the last 168 hours. BNP (last 3 results) No results for input(s): "PROBNP" in the last 8760 hours. HbA1C: No results for input(s): "HGBA1C" in the last 72 hours.  CBG: Recent Labs  Lab 02/17/23 0757 02/17/23 1206 02/17/23 1640 02/17/23 2157 02/18/23 0813  GLUCAP 107* 124* 115* 98 121*   Lipid Profile: No results for input(s): "CHOL", "HDL", "LDLCALC", "TRIG", "CHOLHDL", "LDLDIRECT" in the last 72 hours. Thyroid Function Tests: No results for input(s): "TSH", "T4TOTAL", "FREET4", "T3FREE", "THYROIDAB" in the last 72 hours. Anemia Panel: No results for input(s): "VITAMINB12", "FOLATE", "FERRITIN", "TIBC", "IRON", "RETICCTPCT" in the last 72 hours. Sepsis Labs: Recent Labs  Lab 02/13/23 1832  LATICACIDVEN 1.0    Recent Results (from the past 240 hour(s))  Aerobic Culture w Gram Stain (superficial specimen)     Status: None   Collection Time: 02/13/23 12:30 PM   Specimen: Wound  Result Value Ref Range Status   Specimen Description   Final    WOUND RIGHT FOOT Performed at [facility] [address] [place], [place] [address]    Special Requests   Final    NONE Performed at [facility], [place] [address], [place], [place] [address]    Gram  Stain   Final    RARE WBC PRESENT, PREDOMINANTLY PMN MODERATE GRAM POSITIVE COCCI Performed at [facility], [place] [address] [place], [place] [address]    Culture   Final    ABUNDANT METHICILLIN RESISTANT STAPHYLOCOCCUS AUREUS   Report Status 02/16/2023 FINAL  Final   Organism ID, Bacteria METHICILLIN RESISTANT STAPHYLOCOCCUS AUREUS  Final      Susceptibility   Methicillin resistant staphylococcus aureus - MIC*    CIPROFLOXACIN >=8 RESISTANT Resistant     ERYTHROMYCIN >=8 RESISTANT Resistant     GENTAMICIN <=0.5 SENSITIVE Sensitive     OXACILLIN >=4 RESISTANT Resistant     TETRACYCLINE <=1 SENSITIVE Sensitive     VANCOMYCIN 1 SENSITIVE Sensitive     TRIMETH/SULFA <=10 SENSITIVE Sensitive     CLINDAMYCIN <=0.25 SENSITIVE Sensitive     RIFAMPIN <=0.5 SENSITIVE Sensitive  Inducible Clindamycin NEGATIVE Sensitive     * ABUNDANT METHICILLIN RESISTANT STAPHYLOCOCCUS AUREUS  Blood culture (routine x 2)     Status: None (Preliminary result)   Collection Time: 02/13/23  6:32 PM   Specimen: BLOOD LEFT HAND  Result Value Ref Range Status   Specimen Description BLOOD LEFT HAND  Final   Special Requests   Final    BOTTLES DRAWN AEROBIC AND ANAEROBIC Blood Culture results may not be optimal due to an excessive volume of blood received in culture bottles   Culture   Final    NO GROWTH 4 DAYS Performed at [facility], [place] [address], [place], [place] [address]    Report Status PENDING  Incomplete  Blood culture (routine x 2)     Status: None (Preliminary result)   Collection Time: 02/13/23  6:32 PM   Specimen: BLOOD RIGHT WRIST  Result Value Ref Range Status   Specimen Description BLOOD RIGHT WRIST  Final   Special Requests   Final    BOTTLES DRAWN AEROBIC AND ANAEROBIC Blood Culture results may not be optimal due to an inadequate volume of blood received in culture bottles   Culture   Final    NO GROWTH 4 DAYS Performed at [facility], [place] [address]  [place], [place] [address]    Report Status PENDING  Incomplete  MRSA Next Gen by PCR, Nasal     Status: Abnormal   Collection Time: 02/14/23 10:04 AM   Specimen: Nasal Mucosa; Nasal Swab  Result Value Ref Range Status   MRSA by PCR Next Gen DETECTED (A) NOT DETECTED Final    Comment: RESULT CALLED TO, READ BACK BY AND VERIFIED WITH: RN TAKENYA BURTON ON 02/14/23 @ 2254 BY DRT (NOTE) The GeneXpert MRSA Assay (FDA approved for NASAL specimens only), is one component of a comprehensive MRSA colonization surveillance program. It is not intended to diagnose MRSA infection nor to guide or monitor treatment for MRSA infections. Test performance is not FDA approved in patients less than 38 years old. Performed at [facility], [place] [address], [place], [place] [address]   Aerobic/Anaerobic Culture w Gram Stain (surgical/deep wound)     Status: None (Preliminary result)   Collection Time: 02/15/23  3:17 PM   Specimen: PATH Bone biopsy; Tissue  Result Value Ref Range Status   Specimen Description BONE  Final   Special Requests 4TH METATARSAL HEAD  Final   Gram Stain NO WBC SEEN NO ORGANISMS SEEN   Final   Culture   Final    NO GROWTH 3 DAYS NO ANAEROBES ISOLATED; CULTURE IN PROGRESS FOR 5 DAYS Performed at [facility], [address], [place], [place] [address]    Report Status PENDING  Incomplete  Aerobic/Anaerobic Culture w Gram Stain (surgical/deep wound)     Status: None (Preliminary result)   Collection Time: 02/15/23  3:23 PM   Specimen: PATH Bone biopsy; Tissue  Result Value Ref Range Status   Specimen Description BONE  Final   Special Requests SWABS OF WOUND  Final   Gram Stain   Final    NO WBC SEEN RARE GRAM POSITIVE COCCI IN PAIRS Performed at [facility], [address] [place], [place] [address]    Culture   Final    RARE METHICILLIN RESISTANT STAPHYLOCOCCUS AUREUS NO ANAEROBES ISOLATED; CULTURE IN PROGRESS FOR 5 DAYS    Report Status PENDING   Incomplete   Organism ID, Bacteria METHICILLIN  RESISTANT STAPHYLOCOCCUS AUREUS  Final      Susceptibility   Methicillin resistant staphylococcus aureus - MIC*    CIPROFLOXACIN >=8 RESISTANT Resistant     ERYTHROMYCIN >=8 RESISTANT Resistant     GENTAMICIN <=0.5 SENSITIVE Sensitive     OXACILLIN >=4 RESISTANT Resistant     TETRACYCLINE <=1 SENSITIVE Sensitive     VANCOMYCIN 1 SENSITIVE Sensitive     TRIMETH/SULFA <=10 SENSITIVE Sensitive     CLINDAMYCIN <=0.25 SENSITIVE Sensitive     RIFAMPIN <=0.5 SENSITIVE Sensitive     Inducible Clindamycin NEGATIVE Sensitive     * RARE METHICILLIN RESISTANT STAPHYLOCOCCUS AUREUS         Radiology Studies: No results found.      Scheduled Meds:  atorvastatin  10 mg Oral Daily   busPIRone  5 mg Oral BID   DULoxetine  30 mg Oral Daily   enoxaparin (LOVENOX) injection  40 mg Subcutaneous QHS   insulin aspart  0-15 Units Subcutaneous TID WC   insulin aspart  0-5 Units Subcutaneous QHS   insulin glargine-yfgn  10 Units Subcutaneous BID   metoprolol succinate  100 mg Oral QHS   mupirocin ointment  1 Application Nasal BID   zolpidem  10 mg Oral QHS   Continuous Infusions:  sodium chloride 10 mL/hr at 02/17/23 1857   dextrose 5 % and 0.45% NaCl 50 mL/hr at 02/18/23 [id number]   vancomycin 1,250 mg (02/18/23 0022)     LOS: 5 days    Time spent: 52 minutes spent on chart review, discussion with nursing staff, consultants, updating family and interview/physical exam; more than 50% of that time was spent in counseling and/or coordination of care.    Geradine Girt, DO [facility] Available via Epic secure chat 7am-7pm After these hours, please refer to coverage provider listed on [email or web address] 02/18/2023, 10:06 AM

## 2023-02-18 NOTE — TOC Initial Note (Signed)
Transition of Care [facility]) - Initial/Assessment Note    Patient Details  Name: [name] MRN: [id number] Date of Birth: 1996/07/11  Transition of Care [facility]) CM/SW Contact:    Ninfa Meeker, RN Phone Number: 02/18/2023, 10:38 AM  Clinical Narrative:                 Patient is a 27 yr old male who presented to [facility] ED on 2/21 with progressive right foot infection. Podiatry was consulted. went to the OR on 2/23-- plan to return on 2/26.      Transition of Care [facility]) Department has reviewed patient and no TOC needs have been identified at this time. We will continue to monitor patient advancement through Interdisciplinary progressions and if new patient needs arise, please place a consult.  Patient Goals and CMS Choice            Expected Discharge Plan and Services                                              Prior Living Arrangements/Services                       Activities of Daily Living Home Assistive Devices/Equipment: None ADL Screening (condition at time of admission) Patient's cognitive ability adequate to safely complete daily activities?: Yes Is the patient deaf or have difficulty hearing?: No (to left eye) Does the patient have difficulty seeing, even when wearing glasses/contacts?: Yes (to left eye) Does the patient have difficulty concentrating, remembering, or making decisions?: No Patient able to express need for assistance with ADLs?: No Does the patient have difficulty dressing or bathing?: No Independently performs ADLs?: Yes (appropriate for developmental age) Does the patient have difficulty walking or climbing stairs?: Yes Weakness of Legs: Right Weakness of Arms/Hands: None  Permission Sought/Granted                  Emotional Assessment              Admission diagnosis:  Osteomyelitis ([place]) [M86.9] Other acute osteomyelitis of right foot ([place]) [M86.171] Patient Active Problem List   Diagnosis Date Noted    Osteomyelitis ([place]) 02/13/2023   Cellulitis and abscess of toe of right foot 08/20/2022   Depression 08/20/2022   Diabetes mellitus due to underlying condition with hyperglycemia, with long-term current use of insulin ([place]) 06/20/2021   Medication monitoring encounter 06/20/2021   SIRS (systemic inflammatory response syndrome) ([place]) 06/07/2021   Malnutrition of moderate degree 05/24/2021   Planned postoperative wound closure    Acute osteomyelitis of right foot ([place]) 05/20/2021   Mixed hyperlipidemia 05/20/2021   Anxiety 12/21/2020   Atypical chest pain 12/21/2020   Body mass index (BMI) 30.0-30.9, adult 12/21/2020   Chronic pain 12/21/2020   Disorder of teeth and supporting structures, unspecified 12/21/2020   Epigastric pain 12/21/2020   Gastroesophageal reflux disease 12/21/2020   Headache disorder 12/21/2020   Insomnia 12/21/2020   Leukocytosis 12/21/2020   Oral mucositis (ulcerative), unspecified 12/21/2020   Panic disorder 12/21/2020   Poor sleep pattern 12/21/2020   Sinus tachycardia 123456   Systolic murmur 123456   Uncontrolled type 1 diabetes mellitus with hyperglycemia ([place]) 11/26/2018   Personal history of noncompliance with medical treatment, presenting hazards to health 03/15/2017   Essential hypertension 03/15/2017   Low back pain 03/14/2017  Neck pain 03/14/2017   Type 1 diabetes mellitus ([place]) 03/01/2017   Type 2 diabetes mellitus with hyperglycemia ([place]) 10/19/2015   PCP:  Danna Hefty, DO Pharmacy:   [place], [place] - [id number] [place]  [address] Phone: [phone] Fax: [place] [address] [place] [address] Phone: [phone] Fax: [phone]     Social Determinants of Health (SDOH) Social History: SDOH Screenings   Depression (PHQ2-9): Low Risk  (06/20/2021)  Tobacco Use: Medium Risk (02/15/2023)   SDOH  Interventions:     Readmission Risk Interventions    06/08/2021   12:03 PM  Readmission Risk Prevention Plan  Post Dischage Appt Complete  Medication Screening Complete  Transportation Screening Complete

## 2023-02-19 ENCOUNTER — Inpatient Hospital Stay: Payer: Self-pay

## 2023-02-19 ENCOUNTER — Telehealth: Payer: Self-pay | Admitting: Podiatry

## 2023-02-19 DIAGNOSIS — Z794 Long term (current) use of insulin: Secondary | ICD-10-CM | POA: Diagnosis not present

## 2023-02-19 DIAGNOSIS — E0865 Diabetes mellitus due to underlying condition with hyperglycemia: Secondary | ICD-10-CM | POA: Diagnosis not present

## 2023-02-19 DIAGNOSIS — E1169 Type 2 diabetes mellitus with other specified complication: Secondary | ICD-10-CM | POA: Diagnosis not present

## 2023-02-19 DIAGNOSIS — M86171 Other acute osteomyelitis, right ankle and foot: Secondary | ICD-10-CM | POA: Diagnosis not present

## 2023-02-19 LAB — GLUCOSE, CAPILLARY
Glucose-Capillary: 151 mg/dL — ABNORMAL HIGH (ref 70–99)
Glucose-Capillary: 104 mg/dL — ABNORMAL HIGH (ref 70–99)

## 2023-02-19 LAB — CBC
HCT: 42.9 % (ref 39.0–52.0)
Hemoglobin: 14.1 g/dL (ref 13.0–17.0)
MCH: 28.5 pg (ref 26.0–34.0)
MCHC: 32.9 g/dL (ref 30.0–36.0)
MCV: 86.7 fL (ref 80.0–100.0)
Platelets: 359 10*3/uL (ref 150–400)
RBC: 4.95 MIL/uL (ref 4.22–5.81)
RDW: 12.4 % (ref 11.5–15.5)
WBC: 12.4 10*3/uL — ABNORMAL HIGH (ref 4.0–10.5)
nRBC: 0 % (ref 0.0–0.2)

## 2023-02-19 LAB — CK: Total CK: 96 U/L (ref 49–397)

## 2023-02-19 MED ORDER — SODIUM CHLORIDE 0.9 % IV SOLN
8.0000 mg/kg | Freq: Every day | INTRAVENOUS | Status: DC
  Administered 2023-02-19: 700 mg via INTRAVENOUS
  Filled 2023-02-19: qty 14

## 2023-02-19 MED ORDER — SENNOSIDES-DOCUSATE SODIUM 8.6-50 MG PO TABS: 1.0000 | ORAL_TABLET | Freq: Every evening | ORAL | Status: AC | PRN

## 2023-02-19 MED ORDER — CHLORHEXIDINE GLUCONATE CLOTH 2 % EX PADS
6.0000 | MEDICATED_PAD | Freq: Every day | CUTANEOUS | Status: DC
  Administered 2023-02-19: 6 via TOPICAL

## 2023-02-19 MED ORDER — SODIUM CHLORIDE 0.9% FLUSH: 10.0000 mL | Freq: Two times a day (BID) | INTRAVENOUS | Status: DC

## 2023-02-19 MED ORDER — METOPROLOL SUCCINATE ER 50 MG PO TB24: 50.0000 mg | ORAL_TABLET | Freq: Every day | ORAL | 0 refills | Status: DC

## 2023-02-19 MED ORDER — ATORVASTATIN CALCIUM 10 MG PO TABS: 10.0000 mg | ORAL_TABLET | Freq: Every day | ORAL | 0 refills | Status: DC

## 2023-02-19 MED ORDER — DAPTOMYCIN IV (FOR PTA / DISCHARGE USE ONLY): 700.0000 mg | INTRAVENOUS | 0 refills | Status: AC

## 2023-02-19 MED ORDER — SODIUM CHLORIDE 0.9% FLUSH: 10.0000 mL | INTRAVENOUS | Status: DC | PRN

## 2023-02-19 MED ORDER — METOPROLOL SUCCINATE ER 50 MG PO TB24: 50.0000 mg | ORAL_TABLET | Freq: Every day | ORAL | Status: DC

## 2023-02-19 NOTE — Progress Notes (Addendum)
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 Noted that he was in a total contact cast for about a week, seen by wound care. -Not on abx prior  to hospital. Denies fever, chills. - Taken to the OR for procedure above with Cx+ MRSA(deep tissue Cx+ MRSA, Bone Cx NG). After OR on 2/23 there was some surrounding erythema and possible repeat washout.  -Seen by podiatry yesterday given clinical improvement and drop in wbc no plans for repeat OR visit at this point Recommendations:  -Place PICC -D/C metro -Transition vanc-> daptomycin to complete 6 weeks from OR EOT 415 -F/U in ID clinic with myself on 3/14     #Diabetes mellitus - A1c 6 on 2/23 - Continue glycemic control   ID will sign off.  OPAT ORDERS:  Diagnosis: DFU  Culture Result: MRSA  Allergies  Allergen Reactions   Amoxicillin Hives    Has patient had a PCN reaction causing immediate rash, facial/tongue/throat swelling, SOB or lightheadedness with hypotension: No Has patient had a PCN reaction causing severe rash involving mucus membranes or skin necrosis: No Has patient had a PCN reaction that required  hospitalization: No Has patient had a PCN reaction occurring within the last 10 years: No If all of the above answers are "NO", then may proceed with Cephalosporin use.    Lexapro [Escitalopram] Other (See Comments)     Discharge antibiotics to be given via PICC line:  Per pharmacy protocol daptomycin '8mg'$  /kg/day    Duration: 6 weeks End Date: 4/15  [facility] Care Per Protocol with [employer] Use: Home health RN for IV administration and teaching, line care and labs.    Labs weekly while on IV antibiotics: _x_ CBC with differential  _x_ CMP _x_ CRP _x_ ESR  _x_ CK  x__ Please pull PIC at completion of IV antibiotics   Fax weekly labs to [phone]  Clinic Follow Up Appt: 3/14  @ [facility] with Dr. Candiss Norse   Microbiology:   Antibiotics: Vanc, ceftriaxone. Metronidazole 2/21-p Cultures: Blood 2/21 ND   2/23 bone swab(deep tissue) +staph aureus, 2/23 bone cs of 4th metatarsal head pending     SUBJECTIVE: Sitting on sofa, no new complaints..  Interval: Afebrile overnight, wbc 12.4k  Review of Systems: Review of Systems  All other systems reviewed and are negative.    Scheduled Meds:  busPIRone  5 mg Oral BID   Chlorhexidine Gluconate Cloth  6 each Topical Daily   DULoxetine  30 mg Oral Daily   enoxaparin (LOVENOX) injection  40 mg Subcutaneous QHS   insulin aspart  0-15 Units Subcutaneous TID WC   insulin aspart  0-5 Units Subcutaneous  QHS   insulin glargine-yfgn  10 Units Subcutaneous BID   metoprolol succinate  50 mg Oral QHS   sodium chloride flush  10-40 mL Intracatheter Q12H   zolpidem  10 mg Oral QHS   Continuous Infusions:  sodium chloride 10 mL/hr at 02/17/23 1857   DAPTOmycin (CUBICIN) 700 mg in sodium chloride 0.9 % IVPB 700 mg (02/19/23 1237)   PRN Meds:.sodium chloride, acetaminophen **OR** acetaminophen, HYDROcodone-acetaminophen, hydrOXYzine, morphine injection, senna-docusate, sodium chloride flush Allergies  Allergen Reactions    Amoxicillin Hives    Has patient had a PCN reaction causing immediate rash, facial/tongue/throat swelling, SOB or lightheadedness with hypotension: No Has patient had a PCN reaction causing severe rash involving mucus membranes or skin necrosis: No Has patient had a PCN reaction that required hospitalization: No Has patient had a PCN reaction occurring within the last 10 years: No If all of the above answers are "NO", then may proceed with Cephalosporin use.    Lexapro [Escitalopram] Other (See Comments)    OBJECTIVE: Vitals:   02/18/23 1319 02/18/23 2237 02/19/23 0653 02/19/23 0749  BP: 115/79 126/88 129/81 105/80  Pulse: 92 100  96  Resp: '18 16 16 17  '$ Temp: 97.6 F (36.4 C) 98.6 F (37 C) 97.8 F (36.6 C) 97.8 F (36.6 C)  TempSrc: Oral   Oral  SpO2: 100% 100% 99%   Weight:      Height:       Body mass index is 24.87 kg/m.  Physical Exam Constitutional:      General: He is not in acute distress.    Appearance: He is normal weight. He is not toxic-appearing.  HENT:     Head: Normocephalic and atraumatic.     Right Ear: External ear normal.     Left Ear: External ear normal.     Nose: No congestion or rhinorrhea.     Mouth/Throat:     Mouth: Mucous membranes are moist.     Pharynx: Oropharynx is clear.  Eyes:     Extraocular Movements: Extraocular movements intact.     Conjunctiva/sclera: Conjunctivae normal.     Pupils: Pupils are equal, round, and reactive to light.  Cardiovascular:     Rate and Rhythm: Normal rate and regular rhythm.     Heart sounds: No murmur heard.    No friction rub. No gallop.  Pulmonary:     Effort: Pulmonary effort is normal.     Breath sounds: Normal breath sounds.  Abdominal:     General: Abdomen is flat. Bowel sounds are normal.     Palpations: Abdomen is soft.  Musculoskeletal:        General: No swelling.     Cervical back: Normal range of motion and neck supple.  Skin:    General: Skin is warm and dry.  Neurological:      General: No focal deficit present.     Mental Status: He is oriented to person, place, and time.  Psychiatric:        Mood and Affect: Mood normal.       Lab Results Lab Results  Component Value Date   WBC 12.4 (H) 02/19/2023   HGB 14.1 02/19/2023   HCT 42.9 02/19/2023   MCV 86.7 02/19/2023   PLT 359 02/19/2023    Lab Results  Component Value Date   CREATININE 0.78 02/18/2023   BUN 11 02/18/2023   NA 136 02/18/2023   K 3.6 02/18/2023   CL 101 02/18/2023   CO2 25 02/18/2023  Lab Results  Component Value Date   ALT 13 08/23/2022   AST 18 08/23/2022   ALKPHOS 60 08/23/2022   BILITOT 1.0 08/23/2022        Laurice Record, [facility] 02/19/2023, 3:16 PM

## 2023-02-19 NOTE — TOC Transition Note (Signed)
Transition of Care [facility]) - CM/SW Discharge Note   Patient Details  Name: [name] MRN: [id number] Date of Birth: [birth year]  Transition of Care [facility]) CM/SW Contact:  Sharin Mons, RN Phone Number: 02/19/2023, 4:40 PM   Clinical Narrative:    Patient will DC to: home Anticipated DC date:  02/19/2023 Family notified: yes Transport by:  car          - Osteomyelitis  Per MD patient ready for DC today . RN, patient aware of DC plan. Pt states mom to assist with care once d./c. Pt will d/c to home with LT iv ABX therapy. Pt agreeable to services. Pt without provider preference. Referral made with [facility] for antibiotic therapy and [facility] for [facility], both accepted.  PICC LINE in place, and teaching completed for home abx therapy. Friend to provide transportation to home.  RNCM will sign off for now as intervention is no longer needed. Please consult [place] again if new needs arise.    Final next level of care: Home w Home Health Services Barriers to Discharge: No Barriers Identified   Patient Goals and CMS Choice   Choice offered to / list presented to : Patient  Discharge Placement                         Discharge Plan and Services Additional resources added to the After Visit Summary for                  DME Arranged: Other see comment (IV ABX therapy) DME Agency: Other - Comment ([facility]) Date DME Agency Contacted: 02/19/23 Time DME Agency Contacted: [phone] Representative spoke with at DME Agency: [place]: RN [place] Agency: Other - See comment ([place]) Date [place]: 02/19/23 Time [place]: [id number] Representative spoke with at [place]: [place] Determinants of Health ([place]) Interventions SDOH Screenings   Depression (PHQ2-9): Low Risk  (06/20/2021)  Tobacco Use: Medium Risk (02/18/2023)     Readmission Risk Interventions    06/08/2021   12:03 PM  Readmission Risk Prevention  Plan  Post Dischage Appt Complete  Medication Screening Complete  Transportation Screening Complete

## 2023-02-19 NOTE — Progress Notes (Addendum)
Discharge:  Pt will be d/c from room via wheelchair, Family member is picking up the pt.  Discharge instructions given to the patient and pt stated he understood the instructions. Pt dressed in street clothes and will be leaving with discharge papers and prescriptions  in hand.  IV d/ced, PICC line in place and Pt will be receiving outpt abx.. Pt educated on why he is still needing IV abx and why the PICC line was inserted for long term abx treatment.  Pt states he definitlly understands and education on what to do If the PICC line becomes sore, red or accidentally comes out.  Pt verbally told me what he would do if any of the signs and symptoms was to appear.

## 2023-02-19 NOTE — Progress Notes (Addendum)
[name], [name] ([id number]) [id number] Page 1 of 8 Visit Report for 02/13/2023 Arrival Information Details Patient Name: Date of Service: [name], [name] 02/13/2023 12:30 PM Medical Record Number: [id number] Patient Account Number: [id number] Date of Birth/Sex: Treating RN: [birth year] (26 y.o. Charlean Merl, Lauren Primary Care Tymeka Privette: Orpah Cobb Other Clinician: Referring Hurley Blevins: Treating Jasmain Ahlberg/Extender: Bishop Limbo, Kiersten Weeks in Treatment: 6 Visit Information History Since Last Visit Added or deleted any medications: No Patient Arrived: Ambulatory Any new allergies or adverse reactions: No Arrival Time: 12:47 Had a fall or experienced change in No Accompanied By: mom activities of daily living that may affect Transfer Assistance: None risk of falls: Patient Identification Verified: Yes Signs or symptoms of abuse/neglect since last visito No Secondary Verification Process Completed: Yes Hospitalized since last visit: No Patient Requires Transmission-Based Precautions: Yes Implantable device outside of the clinic excluding No Transmission-Based Precautions: Contact MRSA cellular tissue based products placed in the center Patient Has Alerts: No since last visit: Has Dressing in Place as Prescribed: Yes Has Footwear/Offloading in Place as Prescribed: Yes Right: T Contact Cast otal Pain Present Now: Yes Electronic Signature(s) Signed: 02/18/2023 3:50:45 PM By: Fonnie Mu RN Entered By: Fonnie Mu on 02/13/2023 12:47:36 -------------------------------------------------------------------------------- Clinic Level of Care Assessment Details Patient Name: Date of Service: [name], [name] 02/13/2023 12:30 PM Medical Record Number: [id number] Patient Account Number: [id number] Date of Birth/Sex: Treating RN: May 20, 1996 (26 y.o. Lucious Groves Primary Care Finley Dinkel: Orpah Cobb Other  Clinician: Referring Reinhold Rickey: Treating Bert Givans/Extender: Bishop Limbo, Kiersten Weeks in Treatment: 6 Clinic Level of Care Assessment Items TOOL 4 Quantity Score X- 1 0 Use when only an EandM is performed on FOLLOW-UP visit ASSESSMENTS - Nursing Assessment / Reassessment X- 1 10 Reassessment of Co-morbidities (includes updates in patient status) X- 1 5 Reassessment of Adherence to Treatment Plan ASSESSMENTS - Wound and Skin A ssessment / Reassessment X - Simple Wound Assessment / Reassessment - one wound 1 5 []  - 0 Complex Wound Assessment / Reassessment - multiple wounds []  - 0 Dermatologic / Skin Assessment (not related to wound area) ASSESSMENTS - Focused Assessment X- 1 5 Circumferential Edema Measurements - multi extremities [name], [name] ([id number]) [id number] Page 2 of 8 []  - 0 Nutritional Assessment / Counseling / Intervention []  - 0 Lower Extremity Assessment (monofilament, tuning fork, pulses) []  - 0 Peripheral Arterial Disease Assessment (using hand held doppler) ASSESSMENTS - Ostomy and/or Continence Assessment and Care []  - 0 Incontinence Assessment and Management []  - 0 Ostomy Care Assessment and Management (repouching, etc.) PROCESS - Coordination of Care []  - 0 Simple Patient / Family Education for ongoing care X- 1 20 Complex (extensive) Patient / Family Education for ongoing care X- 1 10 Staff obtains Chiropractor, Records, T Results / Process Orders est []  - 0 Staff telephones HHA, Nursing Homes / Clarify orders / etc []  - 0 Routine Transfer to another Facility (non-emergent condition) []  - 0 Routine Hospital Admission (non-emergent condition) []  - 0 New Admissions / Manufacturing engineer / Ordering NPWT Apligraf, etc. , X- 1 20 Emergency Hospital Admission (emergent condition) []  - 0 Simple Discharge Coordination X- 1 15 Complex (extensive) Discharge Coordination PROCESS - Special Needs []  -  0 Pediatric / Minor Patient Management []  - 0 Isolation Patient Management []  - 0 Hearing / Language / Visual special needs []  - 0 Assessment of Community assistance (transportation, D/C planning, etc.) []  - 0 Additional assistance / Altered mentation []  - 0 Support Surface(s) Assessment (bed,  cushion, seat, etc.) INTERVENTIONS - Wound Cleansing / Measurement X - Simple Wound Cleansing - one wound 1 5 []  - 0 Complex Wound Cleansing - multiple wounds X- 1 5 Wound Imaging (photographs - any number of wounds) []  - 0 Wound Tracing (instead of photographs) X- 1 5 Simple Wound Measurement - one wound []  - 0 Complex Wound Measurement - multiple wounds INTERVENTIONS - Wound Dressings X - Small Wound Dressing one or multiple wounds 1 10 []  - 0 Medium Wound Dressing one or multiple wounds []  - 0 Large Wound Dressing one or multiple wounds []  - 0 Application of Medications - topical []  - 0 Application of Medications - injection INTERVENTIONS - Miscellaneous []  - 0 External ear exam []  - 0 Specimen Collection (cultures, biopsies, blood, body fluids, etc.) []  - 0 Specimen(s) / Culture(s) sent or taken to Lab for analysis []  - 0 Patient Transfer (multiple staff / Michiel Sites Lift / Similar devices) []  - 0 Simple Staple / Suture removal (25 or less) []  - 0 Complex Staple / Suture removal (26 or more) [name], [name] ([id number]) [id number] Page 3 of 8 []  - 0 Hypo / Hyperglycemic Management (close monitor of Blood Glucose) []  - 0 Ankle / Brachial Index (ABI) - do not check if billed separately X- 1 5 Vital Signs Has the patient been seen at the hospital within the last three years: Yes Total Score: 120 Level Of Care: New/Established - Level 4 Electronic Signature(s) Signed: 02/18/2023 3:50:45 PM By: Fonnie Mu RN Entered By: Fonnie Mu on 02/13/2023  13:50:48 -------------------------------------------------------------------------------- Encounter Discharge Information Details Patient Name: Date of Service: [name], [name] 02/13/2023 12:30 PM Medical Record Number: [id number] Patient Account Number: [id number] Date of Birth/Sex: Treating RN: [birth year] (26 y.o. Charlean Merl, Lauren Primary Care Shenae Bonanno: Orpah Cobb Other Clinician: Referring Karimah Winquist: Treating Melisssa Donner/Extender: Bishop Limbo, Kiersten Weeks in Treatment: 6 Encounter Discharge Information Items Discharge Condition: Stable Ambulatory Status: Ambulatory Discharge Destination: Hospital Telephoned: No Orders Sent: Yes Transportation: Private Auto Accompanied By: mom Schedule Follow-up Appointment: Yes Clinical Summary of Care: Patient Declined Electronic Signature(s) Signed: 02/18/2023 3:50:45 PM By: Fonnie Mu RN Entered By: Fonnie Mu on 02/13/2023 13:51:26 -------------------------------------------------------------------------------- Lower Extremity Assessment Details Patient Name: Date of Service: ASAAD, WOHLER 02/13/2023 12:30 PM Medical Record Number: [id number] Patient Account Number: [id number] Date of Birth/Sex: Treating RN: 03/11/1996 (26 y.o. Charlean Merl, Lauren Primary Care Karthik Whittinghill: Orpah Cobb Other Clinician: Referring Dandrae Kustra: Treating Mattelyn Imhoff/Extender: Bishop Limbo, Kiersten Weeks in Treatment: 6 Edema Assessment Assessed: [Left: Yes] [Right: Yes] Edema: [Left: No] [Right: No] Calf Left: Right: Point of Measurement: 34 cm From Medial Instep 37.5 cm 37 cm Ankle Left: Right: Point of Measurement: 9 cm From Medial Instep 21.8 cm 22 cm VANN, MENDILLO ([id number]) [id number] Page 4 of 8 Vascular Assessment Pulses: Dorsalis Pedis Palpable: [Left:Yes] [Right:Yes] Posterior Tibial Palpable: [Left:Yes] [Right:Yes] Electronic Signature(s) Signed: 02/18/2023  3:50:45 PM By: Fonnie Mu RN Entered By: Fonnie Mu on 02/13/2023 13:22:34 -------------------------------------------------------------------------------- Multi-Disciplinary Care Plan Details Patient Name: Date of Service: YARIN, BINGER 02/13/2023 12:30 PM Medical Record Number: [id number] Patient Account Number: [id number] Date of Birth/Sex: Treating RN: 14-Sep-1996 (26 y.o. Lucious Groves Primary Care Daundre Biel: Orpah Cobb Other Clinician: Referring [place] Oborn: Treating Dewon Mendizabal/Extender: Bishop Limbo, Kiersten Weeks in Treatment: 6 Active Inactive Electronic Signature(s) Signed: 03/04/2023 11:06:44 AM By: Shawn Stall RN, BSN Signed: 09/05/2023 9:38:28 AM By: Fonnie Mu RN Previous Signature: 02/18/2023 3:50:45 PM Version By: Fonnie Mu RN Entered By: Shawn Stall  on 03/04/2023 11:06:44 -------------------------------------------------------------------------------- Pain Assessment Details Patient Name: Date of Service: [name], [name] 02/13/2023 12:30 PM Medical Record Number: [id number] Patient Account Number: [id number] Date of Birth/Sex: Treating RN: [birth year] (26 y.o. Lucious Groves Primary Care Emilio Baylock: Orpah Cobb Other Clinician: Referring Kavin Weckwerth: Treating Fujie Dickison/Extender: Bishop Limbo, Kiersten Weeks in Treatment: 6 Active Problems Location of Pain Severity and Description of Pain Patient Has Paino Yes Site Locations Pain Location: [name], [name] ([id number]) [id number] Page 5 of 8 Pain Location: Generalized Pain, Pain in Ulcers With Dressing Change: Yes Duration of the Pain. Constant / Intermittento Constant Rate the pain. Current Pain Level: 4 Worst Pain Level: 10 Least Pain Level: 0 Tolerable Pain Level: 4 Character of Pain Describe the Pain: Aching Pain Management and Medication Current Pain Management: Medication: No Cold Application:  No Rest: No Massage: No Activity: No T.Brown.N.S.: No Heat Application: No Leg drop or elevation: No Is the Current Pain Management Adequate: Adequate How does your wound impact your activities of daily livingo Sleep: No Bathing: No Appetite: No Relationship With Others: No Bladder Continence: No Emotions: No Bowel Continence: No Work: No Toileting: No Drive: No Dressing: No Hobbies: No Electronic Signature(s) Signed: 02/18/2023 3:50:45 PM By: Fonnie Mu RN Entered By: Fonnie Mu on 02/13/2023 12:50:20 -------------------------------------------------------------------------------- Patient/Caregiver Education Details Patient Name: Date of Service: [name] 2/21/2024andnbsp12:30 PM Medical Record Number: [id number] Patient Account Number: [id number] Date of Birth/Gender: Treating RN: 05-09-1996 (26 y.o. Lucious Groves Primary Care Physician: Orpah Cobb Other Clinician: Referring Physician: Treating Physician/Extender: Devona Konig in Treatment: 6 Education Assessment Education Provided To: Patient Education Topics Provided Wound/Skin Impairment: Methods: Explain/Verbal Responses: Reinforcements needed, State content correctly Electronic Signature(s) Signed: 02/18/2023 3:50:45 PM By: Fonnie Mu RN [name] ([id number]) [id number] Page 6 of 8 Entered By: Fonnie Mu on 02/13/2023 13:48:42 -------------------------------------------------------------------------------- Wound Assessment Details Patient Name: Date of Service: [name], [name] 02/13/2023 12:30 PM Medical Record Number: [id number] Patient Account Number: [id number] Date of Birth/Sex: Treating RN: 1996/03/04 (26 y.o. Charlean Merl, Lauren Primary Care Marqual Mi: Orpah Cobb Other Clinician: Referring Terrika Zuver: Treating Nikalas Bramel/Extender: Bishop Limbo, Kiersten Weeks in Treatment:  6 Wound Status Wound Number: 1 Primary Etiology: Diabetic Wound/Ulcer of the Lower Extremity Wound Location: Right Metatarsal head fifth Wound Status: Open Wounding Event: Gradually Appeared Comorbid History: Hypertension, Type I Diabetes, Neuropathy Date Acquired: 12/25/2019 Weeks Of Treatment: 6 Clustered Wound: No Pending Amputation On Presentation Wound Measurements Length: (cm) 7 Width: (cm) 7 Depth: (cm) 2.5 Area: (cm) 38.485 Volume: (cm) 96.211 % Reduction in Area: -54104.2% % Reduction in Volume: -687121.4% Epithelialization: None Tunneling: Yes Location 1 Position (o'clock): 11 Maximum Distance: (cm) 2.5 Location 2 Position (o'clock): 10 Maximum Distance: (cm) 2.5 Location 3 Position (o'clock): 9 Maximum Distance: (cm) 2.5 Undermining: Yes Starting Position (o'clock): 12 Ending Position (o'clock): 12 Maximum Distance: (cm) 3 Wound Description Classification: Grade 2 Wound Margin: Distinct, outline attached Exudate Amount: Large Exudate Type: Purulent Exudate Color: yellow, brown, green Foul Odor After Cleansing: No Slough/Fibrino Yes Wound Bed Granulation Amount: Small (1-33%) Exposed Structure Granulation Quality: Red, Pink Fascia Exposed: No Necrotic Amount: Large (67-100%) Fat Layer (Subcutaneous Tissue) Exposed: Yes Necrotic Quality: Adherent Slough Tendon Exposed: No Muscle Exposed: No Joint Exposed: No Bone Exposed: No Periwound Skin Texture Texture Color No Abnormalities Noted: No No Abnormalities Noted: No Callus: Yes Atrophie Blanche: No Crepitus: No Cyanosis: No Excoriation: Yes Ecchymosis: No Induration: No Erythema: Yes Rash: No Erythema Location: Circumferential Scarring: No Hemosiderin Staining:  No Mottled: No Moisture Pallor: No [name], [name] ([id number]) [id number] Page 7 of 8 Pallor: No No Abnormalities Noted: No Rubor: No Dry / Scaly: No Maceration: Yes Temperature / Pain Temperature:  No Abnormality Tenderness on Palpation: Yes Electronic Signature(s) Signed: 02/18/2023 3:50:45 PM By: Fonnie Mu RN Entered By: Fonnie Mu on 02/13/2023 13:22:01 -------------------------------------------------------------------------------- Wound Assessment Details Patient Name: Date of Service: [name], [name] 02/13/2023 12:30 PM Medical Record Number: [id number] Patient Account Number: [id number] Date of Birth/Sex: Treating RN: Oct 02, 1996 (26 y.o. Charlean Merl, Lauren Primary Care Marcell Chavarin: Orpah Cobb Other Clinician: Referring Najee Manninen: Treating Tiwanda Threats/Extender: Bishop Limbo, Kiersten Weeks in Treatment: 6 Wound Status Wound Number: 2 Primary Etiology: Diabetic Wound/Ulcer of the Lower Extremity Wound Location: Left Metatarsal head fifth Wound Status: Open Wounding Event: Gradually Appeared Date Acquired: 09/24/2022 Weeks Of Treatment: 6 Clustered Wound: No Pending Amputation On Presentation Wound Measurements Length: (cm) 0.3 Width: (cm) 0.5 Depth: (cm) 0.2 Area: (cm) 0.118 Volume: (cm) 0.024 % Reduction in Area: -151.1% % Reduction in Volume: -71.4% Wound Description Classification: Grade 2 Exudate Amount: Medium Exudate Type: Serosanguineous Exudate Color: red, brown Periwound Skin Texture Texture Color No Abnormalities Noted: No No Abnormalities Noted: No Moisture No Abnormalities Noted: No Electronic Signature(s) Signed: 02/18/2023 3:50:45 PM By: Fonnie Mu RN Entered By: Fonnie Mu on 02/13/2023 13:19:08 -------------------------------------------------------------------------------- Vitals Details Patient Name: Date of Service: [name] 02/13/2023 12:30 PM Medical Record Number: [id number] Patient Account Number: [id number] Date of Birth/Sex: Treating RN: [birth year] (26 y.o. Lucious Groves Primary Care Rini Moffit: Orpah Cobb Other Clinician: TORI, GREWELL ([id number])  [id number] Page 8 of 8 Referring Azaylah Stailey: Treating Elyon Zoll/Extender: Bishop Limbo, Kiersten Weeks in Treatment: 6 Vital Signs Time Taken: 12:47 Temperature (F): 98.4 Height (in): 76 Pulse (bpm): 94 Weight (lbs): 189 Respiratory Rate (breaths/min): 17 Body Mass Index (BMI): 23 Blood Pressure (mmHg): 120/86 Capillary Blood Glucose (mg/dl): 188 Reference Range: 80 - 120 mg / dl Electronic Signature(s) Signed: 02/18/2023 3:50:45 PM By: Fonnie Mu RN Entered By: Fonnie Mu on 02/13/2023 12:50:00

## 2023-02-19 NOTE — Discharge Summary (Signed)
Physician Discharge Summary  [name] [id number] DOB: 16-Jul-1996 DOA: 02/13/2023  PCP: Danna Hefty, DO  Admit date: 02/13/2023 Discharge date: 02/19/2023  Admitted From: home Discharge disposition: home   Recommendations for Outpatient Follow-Up:   pICC line-- daptomycin per ID Wound care as below WBAT in post op show CBC at next office visit   Discharge Diagnosis:   Principal Problem:   Osteomyelitis ([place]) Active Problems:   Essential hypertension   Anxiety   Type 2 diabetes mellitus with hyperglycemia (H[facility])   Acute osteomyelitis of right foot ([place])   Mixed hyperlipidemia   Malnutrition of moderate degree   Diabetes mellitus due to underlying condition with hyperglycemia, with long-term current use of insulin ([place])   Depression    Discharge Condition: Improved.  Diet recommendation:   Carbohydrate-modified   Code status: Full.   History of Present Illness:   This is a 27 year old male with known history of diabetes mellitus type 1.5, peripheral arterial insufficiency, hypertension, hyperlipidemia.  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  Since then he has had recurrent ulcer issues and follows with wound care.  On Tuesday he had a total contact cast placed on his right lower extremity.  Two days later he developed significant discomfort at the site of his ulcer under the cast.  He thought the discomfort was because he was using the cast, so he ignored it.  Today he returns to wound care and told him his discomfort was getting worse.  The cast was removed and significant erythema, swelling, increased purulent drainage was noted.  He was sent to the ER.  He denies fevers chills, or vomiting.  He endorses nausea.   In the ER MRI RLE read new soft tissue edema, swelling, and draining sinus tracts dorsal and lateral to the distal metatarsal shaft through the proximal phalanx of the fourth digit. This includes a walled-off pocket of fluid  suspicious for an abscess measuring up to 27 mm along the long axis of the foot. There is marrow edema within the 4rth metatarsal head and trace marrow edema within the proximal plantar aspect of the 4th proximal phalanx suspicious for acute osteomyelitis. White blood count elevated 14.8.  Lactic acid normal.  Admission requested.   Hospital Course by Problem:   Left foot diabetic infection with osteomyelitis/abscess Previously had fifth ray amputation by podiatry.  MR left foot with findings consistent with abscess, acute osteomyelitis. -- Podiatry consulted, s/poperative management on 2/23 -culture of bone showing staph a-- appreciate ID consult- PICC line placed and time -wbc trending down so did not go back to the OR   Diabetes mellitus type 1.5 Follows with endocrinology outpatient.  Home regimen includes Farxiga 10 mg p.o. daily, Lantus 26 units [place] BID.  Last hemoglobin A1c 8.4 on 11/14/2022, not optimally controlled. -resume home meds with dietary compliance   Essential hypertension -- Metoprolol succinate 50 mg PO daily   HLD -- Atorvastatin- held until after daptomycin    Medical Consultants:      Discharge Exam:   Vitals:   02/19/23 0653 02/19/23 0749  BP: 129/81 105/80  Pulse:  96  Resp: 16 17  Temp: 97.8 F (36.6 C) 97.8 F (36.6 C)  SpO2: 99%    Vitals:   02/18/23 1319 02/18/23 2237 02/19/23 0653 02/19/23 0749  BP: 115/79 126/88 129/81 105/80  Pulse: 92 100  96  Resp: '18 16 16 17  '$ Temp: 97.6 F (36.4 C) 98.6 F (37 C)  97.8 F (36.6 C) 97.8 F (36.6 C)  TempSrc: Oral   Oral  SpO2: 100% 100% 99%   Weight:      Height:        General exam: Appears calm and comfortable.   The results of significant diagnostics from this hospitalization (including imaging, microbiology, ancillary and laboratory) are listed below for reference.     Procedures and Diagnostic Studies:   MR FOOT RIGHT WO CONTRAST  Result Date: 02/13/2023 CLINICAL DATA:  27  year old male with worsening right foot infection status post amputation. Seen this morning by wound care and redirected to the emergency department for possible debridement and IV antibiotics. Purulent drainage and pain to previous amputation site. Prior MRI 01/30/2023 without evidence of abscess. New suspected abscess. EXAM: MRI OF THE RIGHT FOREFOOT WITHOUT CONTRAST TECHNIQUE: Multiplanar, multisequence MR imaging of the right foot radiographs 02/13/2023 and 01/30/2023; was performed. No intravenous contrast was administered. COMPARISON:  Right foot radiographs 02/13/2023 and 10/04/2022; MRI right forefoot 01/30/2022 FINDINGS: Despite efforts by the technologist and patient, mild-to-moderate motion artifact is present on today's exam and could not be eliminated. This reduces exam sensitivity and specificity. Bones/Joint/Cartilage Postsurgical changes are again seen of amputation of fifth digit to the mid shaft of the fifth metatarsal. Normal marrow signal is seen within the remaining fifth metatarsal. There is trace marrow edema at the proximal plantar aspect of the proximal phalanx of the fourth digit (sagittal series 8, image 17). In the region of the soft tissue swelling and sinus tracts at the distal lateral aspect of the forefoot described below,there is mild marrow edema within the plantar and lateral aspects of the fourth metatarsal head, however no significant cortical erosion is seen. Findings could represent non-infectious osteitis, however are highly concerning for osteomyelitis given the adjacent sinus tract. Ligaments The Lisfranc ligament complex is intact. The visualized collateral ligaments appear intact. Muscles and Tendons There is diffuse edema seen within the plantar greater than dorsal midfoot musculature, nonspecific myositis. Soft tissues There is new moderate edema and swelling within the soft tissues just plantar lateral to the 4th toe base of the proximal phalanx (axial series 5, images  34 through 39), and this is contiguous with soft tissue swelling and edema more proximally within the dorsal greater than plantar soft tissues at the level of the distal fourth metaphysis (axial series 5 images 40 through 49). Within the soft tissue swelling just plantar lateral to the fourth metatarsophalangeal joint there is a fluid bright sinus tract draining from the lateral aspect of the fourth metatarsal head to the ventral lateral skin surface (coronal series 7 images 19 through 21 and axial series 5 images 36 through 39. There is mild edema within the plantar and lateral aspects of the fourth metatarsal head, however no significant cortical erosion is seen. There is an additional sinus tract that extends dorsally in the region just lateral and dorsal to the proximal aspect of the fourth metatarsal head and the distal shaft of the fourth metatarsal (axial series 5 images 45 through 49 and coronal series 7 images 11 through 14), draining to the skin surface. The deepest aspect of this fluid pools in a walled-off pocket of fluid that measures up to approximately 8 x 27 x 11 mm (transverse by long axis of the foot by short axis of the foot as measured on coronal image 13 and axial image 46) suspicious for an abscess. There are scattered foci of decreased T1 and decreased T2 signal air within  the surrounding soft tissues. IMPRESSION: Compared to 01/30/2023: 1. Postsurgical changes are again seen of amputation of the fifth digit to the level of the mid shaft of the metatarsal. 2. New soft tissue edema, swelling, and draining sinus tracts dorsal and lateral to the distal metatarsal shaft through the proximal phalanx of the fourth digit. This includes a walled-off pocket of fluid suspicious for an abscess measuring up to 27 mm along the long axis of the foot. There is marrow edema within the fourth metatarsal head and trace marrow edema within the proximal plantar aspect of the 4th proximal phalanx suspicious for  acute osteomyelitis. Electronically Signed   By: Yvonne Kendall M.D.   On: 02/13/2023 19:51   DG Foot Complete Right  Result Date: 02/13/2023 CLINICAL DATA:  Status post fifth transmetatarsal amputation with worsening infection at the amputation site EXAM: RIGHT FOOT COMPLETE - 3 VIEW COMPARISON:  Right foot radiograph dated 10/04/2022, MR right foot dated 01/30/2023 FINDINGS: Postsurgical changes from fifth trans metatarsal amputation. There is no evidence of fracture or dislocation. There is no evidence of arthropathy or other focal bone abnormality. Soft tissue defect along the lateral forefoot at the amputation site. No underlying cortical erosion to suggest osteomyelitis. IMPRESSION: Soft tissue defect along the lateral forefoot at the amputation site. No underlying cortical erosion to suggest osteomyelitis. Electronically Signed   By: Darrin Nipper M.D.   On: 02/13/2023 14:59     Labs:   Basic Metabolic Panel: Recent Labs  Lab 02/13/23 1422 02/14/23 0400 02/15/23 0439 02/17/23 0347 02/18/23 0256  NA 137 137 136 136 136  K 4.5 3.8 3.4* 3.7 3.6  CL 99 101 102 101 101  CO2 '25 27 25 '$ 21* 25  GLUCOSE 92 63* 112* 82 98  BUN '17 16 15 10 11  '$ CREATININE 0.82 0.81 0.78 0.77 0.78  CALCIUM 10.0 9.4 9.2 9.2 9.3  MG  --  2.2 2.0  --   --    GFR Estimated Creatinine Clearance: 168.7 mL/min (by C-G formula based on SCr of 0.78 mg/dL). Liver Function Tests: No results for input(s): "AST", "ALT", "ALKPHOS", "BILITOT", "PROT", "ALBUMIN" in the last 168 hours. No results for input(s): "LIPASE", "AMYLASE" in the last 168 hours. No results for input(s): "AMMONIA" in the last 168 hours. Coagulation profile No results for input(s): "INR", "PROTIME" in the last 168 hours.  CBC: Recent Labs  Lab 02/14/23 0400 02/15/23 0439 02/17/23 0347 02/18/23 0256 02/19/23 0146  WBC 14.0* 13.1* 14.2* 13.5* 12.4*  NEUTROABS 8.9*  --   --   --   --   HGB 14.8 14.7 14.4 13.9 14.1  HCT 43.1 42.7 42.2 40.3 42.9   MCV 86.5 85.4 85.9 85.0 86.7  PLT 318 317 294 336 359   Cardiac Enzymes: Recent Labs  Lab 02/19/23 0143  CKTOTAL 96   BNP: Invalid input(s): "POCBNP" CBG: Recent Labs  Lab 02/18/23 1129 02/18/23 1613 02/18/23 2153 02/19/23 0747 02/19/23 1144  GLUCAP 122* 153* 97 151* 104*   D-Dimer No results for input(s): "DDIMER" in the last 72 hours. Hgb A1c No results for input(s): "HGBA1C" in the last 72 hours. Lipid Profile No results for input(s): "CHOL", "HDL", "LDLCALC", "TRIG", "CHOLHDL", "LDLDIRECT" in the last 72 hours. Thyroid function studies No results for input(s): "TSH", "T4TOTAL", "T3FREE", "THYROIDAB" in the last 72 hours.  Invalid input(s): "FREET3" Anemia work up No results for input(s): "VITAMINB12", "FOLATE", "FERRITIN", "TIBC", "IRON", "RETICCTPCT" in the last 72 hours. Microbiology Recent Results (from the past 240  hour(s))  Aerobic Culture w Gram Stain (superficial specimen)     Status: None   Collection Time: 02/13/23 12:30 PM   Specimen: Wound  Result Value Ref Range Status   Specimen Description   Final    WOUND RIGHT FOOT Performed at [facility] [address], [place], [place] [address]    Special Requests   Final    NONE Performed at [facility], [place] [address] [place], [place] [address]    Gram Stain   Final    RARE WBC PRESENT, PREDOMINANTLY PMN MODERATE GRAM POSITIVE COCCI Performed at [facility], [place] [address], [place], [place] [address]    Culture   Final    ABUNDANT METHICILLIN RESISTANT STAPHYLOCOCCUS AUREUS   Report Status 02/16/2023 FINAL  Final   Organism ID, Bacteria METHICILLIN RESISTANT STAPHYLOCOCCUS AUREUS  Final      Susceptibility   Methicillin resistant staphylococcus aureus - MIC*    CIPROFLOXACIN >=8 RESISTANT Resistant     ERYTHROMYCIN >=8 RESISTANT Resistant     GENTAMICIN <=0.5 SENSITIVE Sensitive     OXACILLIN >=4 RESISTANT Resistant     TETRACYCLINE <=1 SENSITIVE  Sensitive     VANCOMYCIN 1 SENSITIVE Sensitive     TRIMETH/SULFA <=10 SENSITIVE Sensitive     CLINDAMYCIN <=0.25 SENSITIVE Sensitive     RIFAMPIN <=0.5 SENSITIVE Sensitive     Inducible Clindamycin NEGATIVE Sensitive     * ABUNDANT METHICILLIN RESISTANT STAPHYLOCOCCUS AUREUS  Blood culture (routine x 2)     Status: None   Collection Time: 02/13/23  6:32 PM   Specimen: BLOOD LEFT HAND  Result Value Ref Range Status   Specimen Description BLOOD LEFT HAND  Final   Special Requests   Final    BOTTLES DRAWN AEROBIC AND ANAEROBIC Blood Culture results may not be optimal due to an excessive volume of blood received in culture bottles   Culture   Final    NO GROWTH 5 DAYS Performed at [facility], [place] [address], [place], [place] [address]    Report Status 02/18/2023 FINAL  Final  Blood culture (routine x 2)     Status: None   Collection Time: 02/13/23  6:32 PM   Specimen: BLOOD RIGHT WRIST  Result Value Ref Range Status   Specimen Description BLOOD RIGHT WRIST  Final   Special Requests   Final    BOTTLES DRAWN AEROBIC AND ANAEROBIC Blood Culture results may not be optimal due to an inadequate volume of blood received in culture bottles   Culture   Final    NO GROWTH 5 DAYS Performed at [facility], [place] [address], [place], [place] [address]    Report Status 02/18/2023 FINAL  Final  MRSA Next Gen by PCR, Nasal     Status: Abnormal   Collection Time: 02/14/23 10:04 AM   Specimen: Nasal Mucosa; Nasal Swab  Result Value Ref Range Status   MRSA by PCR Next Gen DETECTED (A) NOT DETECTED Final    Comment: RESULT CALLED TO, READ BACK BY AND VERIFIED WITH: RN TAKENYA BURTON ON 02/14/23 @ 2254 BY DRT (NOTE) The GeneXpert MRSA Assay (FDA approved for NASAL specimens only), is one component of a comprehensive MRSA colonization surveillance program. It is not intended to diagnose MRSA infection nor to guide or monitor treatment for MRSA infections. Test performance is not FDA  approved in patients less than 77 years old. Performed at [facility], [place]  [address], [place], [place] [address]   Aerobic/Anaerobic Culture w Gram Stain (surgical/deep wound)     Status: None (Preliminary result)   Collection Time: 02/15/23  3:17 PM   Specimen: PATH Bone biopsy; Tissue  Result Value Ref Range Status   Specimen Description BONE  Final   Special Requests 4TH METATARSAL HEAD  Final   Gram Stain NO WBC SEEN NO ORGANISMS SEEN   Final   Culture   Final    NO GROWTH 4 DAYS NO ANAEROBES ISOLATED; CULTURE IN PROGRESS FOR 5 DAYS Performed at [facility], [address], [place], [place] [address]    Report Status PENDING  Incomplete  Aerobic/Anaerobic Culture w Gram Stain (surgical/deep wound)     Status: None (Preliminary result)   Collection Time: 02/15/23  3:23 PM   Specimen: PATH Bone biopsy; Tissue  Result Value Ref Range Status   Specimen Description BONE  Final   Special Requests SWABS OF WOUND  Final   Gram Stain   Final    NO WBC SEEN RARE GRAM POSITIVE COCCI IN PAIRS Performed at [facility], [address], [place], [place] [address]    Culture   Final    RARE METHICILLIN RESISTANT STAPHYLOCOCCUS AUREUS NO ANAEROBES ISOLATED; CULTURE IN PROGRESS FOR 5 DAYS    Report Status PENDING  Incomplete   Organism ID, Bacteria METHICILLIN RESISTANT STAPHYLOCOCCUS AUREUS  Final      Susceptibility   Methicillin resistant staphylococcus aureus - MIC*    CIPROFLOXACIN >=8 RESISTANT Resistant     ERYTHROMYCIN >=8 RESISTANT Resistant     GENTAMICIN <=0.5 SENSITIVE Sensitive     OXACILLIN >=4 RESISTANT Resistant     TETRACYCLINE <=1 SENSITIVE Sensitive     VANCOMYCIN 1 SENSITIVE Sensitive     TRIMETH/SULFA <=10 SENSITIVE Sensitive     CLINDAMYCIN <=0.25 SENSITIVE Sensitive     RIFAMPIN <=0.5 SENSITIVE Sensitive     Inducible Clindamycin NEGATIVE Sensitive     * RARE METHICILLIN RESISTANT STAPHYLOCOCCUS AUREUS     Discharge Instructions:    Discharge Instructions     [facility] pharmacist to adjust dose for Vancomycin, Aminoglycosides and other anti-infective therapies as requested by physician.   Complete by: As directed    [facility] to provide Cath Flo '2mg'$    Complete by: As directed    Administer for PICC line occlusion and as ordered by physician for other access device issues.   Anaphylaxis Kit: Provided to treat any anaphylactic reaction to the medication being provided to the patient if First Dose or when requested by physician   Complete by: As directed    Epinephrine '1mg'$ /ml vial / amp: Administer 0.'3mg'$  (0.72m) subcutaneously once for moderate to severe anaphylaxis, nurse to call physician and pharmacy when reaction occurs and call 911 if needed for immediate care   Diphenhydramine '50mg'$ /ml IV vial: Administer 25-'50mg'$  IV/IM PRN for first dose reaction, rash, itching, mild reaction, nurse to call physician and pharmacy when reaction occurs   Sodium Chloride 0.9% NS 5054mIV: Administer if needed for hypovolemic blood pressure drop or as ordered by physician after call to physician with anaphylactic reaction   Change dressing on IV access line weekly and PRN   Complete by: As directed    Diet Carb Modified   Complete by: As directed    Discharge wound care:   Complete by: As directed    daily dressing changes betadine wet to dry in the plantar wound Will follow-up one week  from discharge in podiatry clinic and continue follow-up in wound care clinic.   Flush IV access with Sodium Chloride 0.9% and Heparin 10 units/ml or 100 units/ml   Complete by: As directed    Home infusion instructions - [facility]   Complete by: As directed    Instructions: Flush IV access with Sodium Chloride 0.9% and Heparin 10units/ml or 100units/ml   Change dressing on IV access line: Weekly and PRN   Instructions Cath Flo '2mg'$ : Administer for PICC Line occlusion and as ordered by physician for other access  device   [facility] pharmacist to adjust dose for: Vancomycin, Aminoglycosides and other anti-infective therapies as requested by physician   Increase activity slowly   Complete by: As directed    Method of administration may be changed at the discretion of home infusion pharmacist based upon assessment of the patient and/or caregiver's ability to self-administer the medication ordered   Complete by: As directed       Allergies as of 02/19/2023       Reactions   Amoxicillin Hives   Has patient had a PCN reaction causing immediate rash, facial/tongue/throat swelling, SOB or lightheadedness with hypotension: No Has patient had a PCN reaction causing severe rash involving mucus membranes or skin necrosis: No Has patient had a PCN reaction that required hospitalization: No Has patient had a PCN reaction occurring within the last 10 years: No If all of the above answers are "NO", then may proceed with Cephalosporin use.   Lexapro [escitalopram] Other (See Comments)        Medication List     TAKE these medications    accu-chek soft touch lancets Use as instructed   accu-chek softclix lancets Use as instructed 4 x daily. e11.65   aspirin EC 81 MG tablet Take 1 tablet (81 mg total) by mouth daily.   atorvastatin 10 MG tablet Commonly known as: LIPITOR Take 1 tablet (10 mg total) by mouth daily. Hold while on Daptomycin therapy - can resume taking 03/30/23 Start taking on: March 30, 2023 What changed:  additional instructions These instructions start on March 30, 2023. If you are unsure what to do until then, ask your doctor or other care provider.   blood glucose meter kit and supplies Kit Dispense based on patient and insurance preference. Use up to four times daily as directed. (FOR ICD-10 E11.65).   busPIRone 5 MG tablet Commonly known as: BUSPAR Take 5 mg by mouth 2 (two) times daily.   CareTouch Pen Needles 31G X 6 MM Misc Generic drug: Insulin Pen  Needle Use 6x a day   daptomycin  IVPB Commonly known as: CUBICIN Inject 700 mg into the vein daily. Indication:  MRSA DFI/osteo First Dose: Yes Last Day of Therapy:  03/29/23 Labs - Once weekly:  CBC/D, BMP, and CPK Labs - Every other week:  ESR and CRP Method of administration: IV Push Pull PICC line at the completion of IV therapy Method of administration may be changed at the discretion of home infusion pharmacist based upon assessment of the patient and/or caregiver's ability to self-administer the medication ordered.   DULoxetine 30 MG capsule Commonly known as: CYMBALTA Take 30 mg by mouth daily.   Farxiga 10 MG Tabs tablet Generic drug: dapagliflozin propanediol Take 10 mg by mouth daily.   Gvoke HypoPen 1-Pack 1 MG/0.2ML Soaj Generic drug: Glucagon Inject 1 mg under skin as needed for hypoglycemia   hydrOXYzine 50 MG tablet Commonly known as: ATARAX Take  50 mg by mouth daily as needed for anxiety, itching, nausea or vomiting.   ibuprofen 800 MG tablet Commonly known as: ADVIL Take 800 mg by mouth 3 (three) times daily as needed for headache, mild pain or moderate pain.   Lantus SoloStar 100 UNIT/ML Solostar Pen Generic drug: insulin glargine Inject 20 Units into the skin daily. What changed:  how much to take when to take this   metoprolol succinate 50 MG 24 hr tablet Commonly known as: TOPROL-XL Take 1 tablet (50 mg total) by mouth at bedtime. Take with or immediately following a meal. What changed:  medication strength how much to take   nitroGLYCERIN 0.4 MG SL tablet Commonly known as: Nitrostat Place 1 tablet (0.4 mg total) under the tongue every 5 (five) minutes as needed for chest pain.   senna-docusate 8.6-50 MG tablet Commonly known as: Senokot-S Take 1 tablet by mouth at bedtime as needed for mild constipation.   Trulicity 1.5 0000000 Sopn Generic drug: Dulaglutide Inject 1.5 mg into the skin once a week.   zolpidem 10 MG tablet Commonly  known as: AMBIEN Take 10 mg by mouth at bedtime.               Discharge Care Instructions  (From admission, onward)           Start     Ordered   02/19/23 0000  Change dressing on IV access line weekly and PRN  (Home infusion instructions - [facility] )        02/19/23 1201   02/19/23 0000  Discharge wound care:       Comments: daily dressing changes betadine wet to dry in the plantar wound Will follow-up one week from discharge in podiatry clinic and continue follow-up in wound care clinic.   02/19/23 1209              Time coordinating discharge: 45 min  Signed:  Geradine Girt DO  [facility] 02/19/2023, 12:15 PM

## 2023-02-19 NOTE — Progress Notes (Signed)
Peripherally Inserted Central Catheter Placement  The IV Nurse has discussed with the patient and/or persons authorized to consent for the patient, the purpose of this procedure and the potential benefits and risks involved with this procedure.  The benefits include less needle sticks, lab draws from the catheter, and the patient may be discharged home with the catheter. Risks include, but not limited to, infection, bleeding, blood clot (thrombus formation), and puncture of an artery; nerve damage and irregular heartbeat and possibility to perform a PICC exchange if needed/ordered by physician.  Alternatives to this procedure were also discussed.  Bard Power PICC patient education guide, fact sheet on infection prevention and patient information card has been provided to patient /or left at bedside.    PICC Placement Documentation  PICC Single Lumen [id number] Right Basilic 39 cm 1 cm (Active)  Indication for Insertion or Continuance of Line Home intravenous therapies (PICC only) 02/19/23 1454  Exposed Catheter (cm) 0 cm 02/19/23 1454  Site Assessment Clean, Dry, Intact 02/19/23 1454  Line Status Flushed;Saline locked;Blood return noted 02/19/23 1454  Dressing Type Transparent;Securing device 02/19/23 1454  Dressing Status Antimicrobial disc in place;Clean, Dry, Intact 02/19/23 1454  Safety Lock Not Applicable [id number]  Line Care Connections checked and tightened 02/19/23 1454  Line Adjustment (NICU/IV Team Only) No 02/19/23 1454  Dressing Intervention New dressing;Other (Comment) 02/19/23 1454  Dressing Change Due 02/26/23 02/19/23 1454       Enos Fling 02/19/2023, 2:55 PM

## 2023-02-19 NOTE — Telephone Encounter (Signed)
Patient discharged from [facility] 2/27. Dr. Loel Lofty did surgery and will need a 1 week follow up for him. Thanks!

## 2023-02-20 LAB — AEROBIC/ANAEROBIC CULTURE W GRAM STAIN (SURGICAL/DEEP WOUND)
Culture: NO GROWTH
Gram Stain: NONE SEEN
Gram Stain: NONE SEEN

## 2023-02-20 LAB — SURGICAL PATHOLOGY

## 2023-02-20 NOTE — Telephone Encounter (Signed)
Pt scheduled to see Dr Loel Lofty on 3.7.2024 in [place] office.

## 2023-02-21 ENCOUNTER — Encounter: Payer: Self-pay | Admitting: Radiology

## 2023-02-21 DIAGNOSIS — M86171 Other acute osteomyelitis, right ankle and foot: Secondary | ICD-10-CM | POA: Diagnosis not present

## 2023-02-22 DIAGNOSIS — M86171 Other acute osteomyelitis, right ankle and foot: Secondary | ICD-10-CM | POA: Diagnosis not present

## 2023-02-23 DIAGNOSIS — M86171 Other acute osteomyelitis, right ankle and foot: Secondary | ICD-10-CM | POA: Diagnosis not present

## 2023-02-24 DIAGNOSIS — M86171 Other acute osteomyelitis, right ankle and foot: Secondary | ICD-10-CM | POA: Diagnosis not present

## 2023-02-25 DIAGNOSIS — M86171 Other acute osteomyelitis, right ankle and foot: Secondary | ICD-10-CM | POA: Diagnosis not present

## 2023-02-26 DIAGNOSIS — Z452 Encounter for adjustment and management of vascular access device: Secondary | ICD-10-CM | POA: Diagnosis not present

## 2023-02-26 DIAGNOSIS — B9562 Methicillin resistant Staphylococcus aureus infection as the cause of diseases classified elsewhere: Secondary | ICD-10-CM | POA: Diagnosis not present

## 2023-02-26 DIAGNOSIS — M86171 Other acute osteomyelitis, right ankle and foot: Secondary | ICD-10-CM | POA: Diagnosis not present

## 2023-02-27 DIAGNOSIS — M86171 Other acute osteomyelitis, right ankle and foot: Secondary | ICD-10-CM | POA: Diagnosis not present

## 2023-02-28 ENCOUNTER — Ambulatory Visit: Payer: BC Managed Care – PPO

## 2023-02-28 ENCOUNTER — Ambulatory Visit (INDEPENDENT_AMBULATORY_CARE_PROVIDER_SITE_OTHER): Payer: BC Managed Care – PPO | Admitting: Podiatry

## 2023-02-28 DIAGNOSIS — M86171 Other acute osteomyelitis, right ankle and foot: Secondary | ICD-10-CM | POA: Diagnosis not present

## 2023-02-28 DIAGNOSIS — L84 Corns and callosities: Secondary | ICD-10-CM

## 2023-02-28 DIAGNOSIS — E0865 Diabetes mellitus due to underlying condition with hyperglycemia: Secondary | ICD-10-CM

## 2023-02-28 DIAGNOSIS — Z9889 Other specified postprocedural states: Secondary | ICD-10-CM

## 2023-02-28 DIAGNOSIS — L03115 Cellulitis of right lower limb: Secondary | ICD-10-CM

## 2023-02-28 DIAGNOSIS — L97512 Non-pressure chronic ulcer of other part of right foot with fat layer exposed: Secondary | ICD-10-CM

## 2023-02-28 DIAGNOSIS — Z794 Long term (current) use of insulin: Secondary | ICD-10-CM

## 2023-02-28 DIAGNOSIS — Z89431 Acquired absence of right foot: Secondary | ICD-10-CM

## 2023-02-28 NOTE — Progress Notes (Signed)
Subjective:  Patient ID: [name], male    DOB: 04-Feb-1996,  MRN: [id number]  Chief Complaint  Patient presents with   Routine Post Op    dos 2.23.24 RIGHT FOOT WITH FOURTH TOE HEAD RESECTION AND STIMULAN BEADS    DOS: 02/15/2023 Procedure: Right foot fourth metatarsal head excision incision and drainage of abscess and partial closure of ulceration with application of antibiotic beads  27 y.o. male returns for post-op check. Patient reports he has been doing well since his discharge from the hospital.  He has a PICC line in place and is getting the IV antibiotics per infectious disease.  He has been doing daily dressing changes with Betadine and gauze dressing.  Thinks area is getting better denies drainage.  Denies nausea vomiting fever chills overall feels that he is improving he has been using the postoperative shoe on the right foot.  Review of Systems: Negative except as noted in the HPI. Denies N/V/F/Ch.   Objective:  There were no vitals filed for this visit. There is no height or weight on file to calculate BMI. Constitutional Well developed. Well nourished.  Vascular Foot warm and well perfused. Capillary refill normal to all digits.  Calf is soft and supple, no posterior calf or knee pain, negative Homans' sign  Neurologic Normal speech. Oriented to person, place, and time. Epicritic sensation to light touch grossly absent bilaterally  Dermatologic Skin healing well without signs of infection. Skin edges well coapted without signs of infection.  Plantar ulceration that was previously under the fourth metatarsal head is healing well with no sign of infection.  At the dorsal incision it is well coapted and healing with some eschar.  There is a small area that was previously opened to allow some drainage that is healing with mixed fibrotic and granular tissue base but it is dry and healthy at this time no erythema surrounding the incisions.      Orthopedic: Nontender  to palpation noted about the surgical site.   Multiple view plain film radiographs: 02/28/2023 XR 3 views AP lateral oblique right foot weightbearing.  Findings: Attention directed to the fourth metatarsal is in the interval resection of the fourth metatarsal head.  There is still some antibiotic bead material present in the space of the prior fourth metatarsal head that is dissolving.  No residual osteolysis or erosions noted at the shaft of the fourth metatarsal or the fourth proximal phalanx. Assessment:   1. Post-operative state   2. Right foot ulcer, with fat layer exposed ([place])   3. Cellulitis of right foot   4. Pre-ulcerative calluses   5. History of amputation of right foot ([place])   6. 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  S/p foot surgery right foot fourth metatarsal head resection antibiotic bead application -Discussed that the pathology report did not show osteomyelitis in the fourth metatarsal head however based on what I saw clinically and not necessarily in agreement with that assessment.  Additionally I do believe that removal of the fourth metatarsal head will be beneficial in helping to heal the chronic pressure ulceration that was present underlying that location going forward -Progressing as expected post-operatively.  Will continue with local wound care for residual wound at the dorsal incision line -XR: As above no acute postoperative complication -WB Status: Weightbearing as tolerated in postoperative shoe  -Sutures: Removed at this  visit. -Medications: Continue IV antibiotics per infectious disease -Foot redressed with Betadine gauze dressing  Return in about 3 weeks (around 03/21/2023) for 2nd POV R foot.         Everitt Amber, DPM [facility]

## 2023-03-01 DIAGNOSIS — M86171 Other acute osteomyelitis, right ankle and foot: Secondary | ICD-10-CM | POA: Diagnosis not present

## 2023-03-02 DIAGNOSIS — M86171 Other acute osteomyelitis, right ankle and foot: Secondary | ICD-10-CM | POA: Diagnosis not present

## 2023-03-03 DIAGNOSIS — M86171 Other acute osteomyelitis, right ankle and foot: Secondary | ICD-10-CM | POA: Diagnosis not present

## 2023-03-04 DIAGNOSIS — M86171 Other acute osteomyelitis, right ankle and foot: Secondary | ICD-10-CM | POA: Diagnosis not present

## 2023-03-05 DIAGNOSIS — M86171 Other acute osteomyelitis, right ankle and foot: Secondary | ICD-10-CM | POA: Diagnosis not present

## 2023-03-05 DIAGNOSIS — B9562 Methicillin resistant Staphylococcus aureus infection as the cause of diseases classified elsewhere: Secondary | ICD-10-CM | POA: Diagnosis not present

## 2023-03-06 DIAGNOSIS — M86171 Other acute osteomyelitis, right ankle and foot: Secondary | ICD-10-CM | POA: Diagnosis not present

## 2023-03-07 ENCOUNTER — Other Ambulatory Visit: Payer: Self-pay

## 2023-03-07 ENCOUNTER — Ambulatory Visit (INDEPENDENT_AMBULATORY_CARE_PROVIDER_SITE_OTHER): Payer: BC Managed Care – PPO | Admitting: Internal Medicine

## 2023-03-07 ENCOUNTER — Encounter: Payer: Self-pay | Admitting: Internal Medicine

## 2023-03-07 VITALS — BP 122/87 | HR 91 | Temp 97.4°F | Resp 16 | Ht 75.0 in | Wt 192.0 lb

## 2023-03-07 DIAGNOSIS — M86171 Other acute osteomyelitis, right ankle and foot: Secondary | ICD-10-CM | POA: Diagnosis not present

## 2023-03-07 NOTE — Progress Notes (Signed)
Patient Active Problem List   Diagnosis Date Noted   Osteomyelitis ([place]) 02/13/2023   Cellulitis and abscess of toe of right foot 08/20/2022   Depression 08/20/2022   Diabetes mellitus due to underlying condition with hyperglycemia, with long-term current use of insulin ([place]) 06/20/2021   Medication monitoring encounter 06/20/2021   SIRS (systemic inflammatory response syndrome) ([place]) 06/07/2021   Malnutrition of moderate degree 05/24/2021   Planned postoperative wound closure    Acute osteomyelitis of right foot ([place]) 05/20/2021   Mixed hyperlipidemia 05/20/2021   Anxiety 12/21/2020   Atypical chest pain 12/21/2020   Body mass index (BMI) 30.0-30.9, adult 12/21/2020   Chronic pain 12/21/2020   Disorder of teeth and supporting structures, unspecified 12/21/2020   Epigastric pain 12/21/2020   Gastroesophageal reflux disease 12/21/2020   Headache disorder 12/21/2020   Insomnia 12/21/2020   Leukocytosis 12/21/2020   Oral mucositis (ulcerative), unspecified 12/21/2020   Panic disorder 12/21/2020   Poor sleep pattern 12/21/2020   Sinus tachycardia 123456   Systolic murmur 123456   Uncontrolled type 1 diabetes mellitus with hyperglycemia ([place]) 11/26/2018   Personal history of noncompliance with medical treatment, presenting hazards to health 03/15/2017   Essential hypertension 03/15/2017   Low back pain 03/14/2017   Neck pain 03/14/2017   Type 1 diabetes mellitus ([place]) 03/01/2017   Type 2 diabetes mellitus with hyperglycemia ([place]) 10/19/2015    Patient's Medications  New Prescriptions   No medications on file  Previous Medications   ASPIRIN EC 81 MG TABLET    Take 1 tablet (81 mg total) by mouth daily.   ATORVASTATIN (LIPITOR) 10 MG TABLET    Take 1 tablet (10 mg total) by mouth daily. Hold while on Daptomycin therapy - can resume taking 03/30/23   BLOOD GLUCOSE METER KIT AND SUPPLIES KIT    Dispense based on patient and insurance preference. Use up to four  times daily as directed. (FOR ICD-10 E11.65).   BUSPIRONE (BUSPAR) 5 MG TABLET    Take 5 mg by mouth 2 (two) times daily.   DAPTOMYCIN (CUBICIN) IVPB    Inject 700 mg into the vein daily. Indication:  MRSA DFI/osteo First Dose: Yes Last Day of Therapy:  03/29/23 Labs - Once weekly:  CBC/D, BMP, and CPK Labs - Every other week:  ESR and CRP Method of administration: IV Push Pull PICC line at the completion of IV therapy Method of administration may be changed at the discretion of home infusion pharmacist based upon assessment of the patient and/or caregiver's ability to self-administer the medication ordered.   DULAGLUTIDE (TRULICITY) 1.5 0000000 SOPN    Inject 1.5 mg into the skin once a week.   DULOXETINE (CYMBALTA) 30 MG CAPSULE    Take 30 mg by mouth daily.   FARXIGA 10 MG TABS TABLET    Take 10 mg by mouth daily.   GVOKE HYPOPEN 1-PACK 1 MG/0.2ML SOAJ    Inject 1 mg under skin as needed for hypoglycemia   HYDROXYZINE (ATARAX) 50 MG TABLET    Take 50 mg by mouth daily as needed for anxiety, itching, nausea or vomiting.   IBUPROFEN (ADVIL) 800 MG TABLET    Take 800 mg by mouth 3 (three) times daily as needed for headache, mild pain or moderate pain.   INSULIN GLARGINE (LANTUS SOLOSTAR) 100 UNIT/ML SOLOSTAR PEN    Inject 20 Units into the skin daily.   INSULIN PEN NEEDLE (CARETOUCH PEN NEEDLES) 31G X 6 MM  MISC    Use 6x a day   LANCET DEVICES (ACCU-CHEK SOFTCLIX) LANCETS    Use as instructed 4 x daily. e11.65   LANCETS (ACCU-CHEK SOFT TOUCH) LANCETS    Use as instructed   METOPROLOL SUCCINATE (TOPROL-XL) 50 MG 24 HR TABLET    Take 1 tablet (50 mg total) by mouth at bedtime. Take with or immediately following a meal.   NITROGLYCERIN (NITROSTAT) 0.4 MG SL TABLET    Place 1 tablet (0.4 mg total) under the tongue every 5 (five) minutes as needed for chest pain.   SENNA-DOCUSATE (SENOKOT-S) 8.6-50 MG TABLET    Take 1 tablet by mouth at bedtime as needed for mild constipation.   ZOLPIDEM (AMBIEN) 10  MG TABLET    Take 10 mg by mouth at bedtime.  Modified Medications   No medications on file  Discontinued Medications   No medications on file    Subjective: 27 year old male with history of diabetes and past medical history including history of MRSA right foot wound treated with daptomycin EOT 06/14/2021 presents for hospital follow-up of.  He was admitted 2/21 - 2/27 for right foot osteomyelitis.  Found to have right fourth metatarsal osteomyelitis status post fourth metatarsal head resection on 2/23 and debridement.  Patient had a puncture wound in the bottom of his right foot. He was in a total contact cast for about a week and then noted the wound by wound care.  Or cultures grew MRSA(deep tissue Cx+ MRSA, Bone Cx NG). After OR on 2/2 there was some concern of surrounding erythema and possible repeat washout by podiatry.  Eventually discharged without a second intervention.  PICC line was placed and plan to complete 6 weeks of antibiotics EOT 4/15 . Today 03/07/23: Doing well.   Review of Systems: Review of Systems  All other systems reviewed and are negative.   Past Medical History:  Diagnosis Date   Complication of anesthesia    Hard to wake up when he was younger once.   Diabetes mellitus, type II ([place])    Heart murmur    Hypertension     Social History   Tobacco Use   Smoking status: Never    Passive exposure: Yes   Smokeless tobacco: Never  Vaping Use   Vaping Use: Never used  Substance Use Topics   Alcohol use: No   Drug use: No    Family History  Problem Relation Age of Onset   Healthy Mother    Healthy Father    Healthy Sister    Healthy Sister    Diabetes Maternal Grandmother    Thyroid disease Maternal Grandmother    Hypertension Maternal Grandmother    Neuropathy Maternal Grandmother     Allergies  Allergen Reactions   Amoxicillin Hives    Has patient had a PCN reaction causing immediate rash, facial/tongue/throat swelling, SOB or lightheadedness  with hypotension: No Has patient had a PCN reaction causing severe rash involving mucus membranes or skin necrosis: No Has patient had a PCN reaction that required hospitalization: No Has patient had a PCN reaction occurring within the last 10 years: No If all of the above answers are "NO", then may proceed with Cephalosporin use.    Lexapro [Escitalopram] Other (See Comments)    Health Maintenance  Topic Date Due   COVID-19 Vaccine (1) Never done   OPHTHALMOLOGY EXAM  Never done   HPV VACCINES (1 - Male 2-dose series) Never done   Diabetic kidney evaluation - Urine ACR  Never done   Hepatitis C Screening  Never done   DTaP/Tdap/Td (1 - Tdap) Never done   INFLUENZA VACCINE  Never done   HEMOGLOBIN A1C  08/16/2023   FOOT EXAM  11/09/2023   Diabetic kidney evaluation - eGFR measurement  02/19/2024   HIV Screening  Completed    Objective:  There were no vitals filed for this visit. There is no height or weight on file to calculate BMI.  Physical Exam Constitutional:      General: He is not in acute distress.    Appearance: He is normal weight. He is not toxic-appearing.  HENT:     Head: Normocephalic and atraumatic.     Right Ear: External ear normal.     Left Ear: External ear normal.     Nose: No congestion or rhinorrhea.     Mouth/Throat:     Mouth: Mucous membranes are moist.     Pharynx: Oropharynx is clear.  Eyes:     Extraocular Movements: Extraocular movements intact.     Conjunctiva/sclera: Conjunctivae normal.     Pupils: Pupils are equal, round, and reactive to light.  Cardiovascular:     Rate and Rhythm: Normal rate and regular rhythm.     Heart sounds: No murmur heard.    No friction rub. No gallop.  Pulmonary:     Effort: Pulmonary effort is normal.     Breath sounds: Normal breath sounds.  Abdominal:     General: Abdomen is flat. Bowel sounds are normal.     Palpations: Abdomen is soft.  Musculoskeletal:        General: No swelling. Normal range of  motion.     Cervical back: Normal range of motion and neck supple.  Skin:    General: Skin is warm and dry.  Neurological:     General: No focal deficit present.     Mental Status: He is oriented to person, place, and time.  Psychiatric:        Mood and Affect: Mood normal.     Lab Results Lab Results  Component Value Date   WBC 12.4 (H) 02/19/2023   HGB 14.1 02/19/2023   HCT 42.9 02/19/2023   MCV 86.7 02/19/2023   PLT 359 02/19/2023    Lab Results  Component Value Date   CREATININE 0.78 02/18/2023   BUN 11 02/18/2023   NA 136 02/18/2023   K 3.6 02/18/2023   CL 101 02/18/2023   CO2 25 02/18/2023    Lab Results  Component Value Date   ALT 13 08/23/2022   AST 18 08/23/2022   ALKPHOS 60 08/23/2022   BILITOT 1.0 08/23/2022    No results found for: "CHOL", "HDL", "LDLCALC", "LDLDIRECT", "TRIG", "CHOLHDL" Lab Results  Component Value Date   LABRPR Non Reactive 02/08/2022   No results found for: "HIV1RNAQUANT", "HIV1RNAVL", "CD4TABS"   Problem List Items Addressed This Visit   None  Assessment/Plan #Right fourth metatarsal osteomyelitis status post fourth metatarsal head resection on 2/23 and debridement   -Or cultures grew MRSA(deep tissue Cx+ MRSA, Bone Cx NG) discharged on 6 weeks of IV daptomycin from OR EOT 4/15 -Path was negative for osteo. Given prior Hx of MRSA osteo will treat with 6 weeks -[facility] last week, pulled stiches.  -Wound is a bit open but healing. Much improved since I saw him inpatient on 02/19/23.   #Medication monitoring -ESR 44, CRP 4, Scr 0.83, wbc 8.6 CK 124 on 02/26/23 -F/U on 4/15 at completion  of Abx  Laurice Record, MD [facility] 03/07/2023, 8:26 AM   I have personally spent 41 minutes involved in face-to-face and non-face-to-face activities for this patient on the day of the visit. Professional time spent includes the following activities: Preparing to see the patient  (review of tests), Obtaining and/or reviewing separately obtained history (admission/discharge record), Performing a medically appropriate examination and/or evaluation , Ordering medications/tests/procedures, referring and communicating with other health care professionals, Documenting clinical information in the EMR, Independently interpreting results (not separately reported), Communicating results to the patient/family/caregiver, Counseling and educating the patient/family/caregiver and Care coordination (not separately reported).

## 2023-03-08 DIAGNOSIS — M86171 Other acute osteomyelitis, right ankle and foot: Secondary | ICD-10-CM | POA: Diagnosis not present

## 2023-03-09 DIAGNOSIS — M86171 Other acute osteomyelitis, right ankle and foot: Secondary | ICD-10-CM | POA: Diagnosis not present

## 2023-03-10 DIAGNOSIS — M86171 Other acute osteomyelitis, right ankle and foot: Secondary | ICD-10-CM | POA: Diagnosis not present

## 2023-03-11 DIAGNOSIS — M86171 Other acute osteomyelitis, right ankle and foot: Secondary | ICD-10-CM | POA: Diagnosis not present

## 2023-03-12 DIAGNOSIS — M86171 Other acute osteomyelitis, right ankle and foot: Secondary | ICD-10-CM | POA: Diagnosis not present

## 2023-03-12 DIAGNOSIS — M8700 Idiopathic aseptic necrosis of unspecified bone: Secondary | ICD-10-CM | POA: Diagnosis not present

## 2023-03-12 DIAGNOSIS — B9562 Methicillin resistant Staphylococcus aureus infection as the cause of diseases classified elsewhere: Secondary | ICD-10-CM | POA: Diagnosis not present

## 2023-03-13 DIAGNOSIS — M86171 Other acute osteomyelitis, right ankle and foot: Secondary | ICD-10-CM | POA: Diagnosis not present

## 2023-03-14 DIAGNOSIS — M86171 Other acute osteomyelitis, right ankle and foot: Secondary | ICD-10-CM | POA: Diagnosis not present

## 2023-03-15 ENCOUNTER — Ambulatory Visit (INDEPENDENT_AMBULATORY_CARE_PROVIDER_SITE_OTHER): Payer: BC Managed Care – PPO | Admitting: Internal Medicine

## 2023-03-15 ENCOUNTER — Encounter: Payer: Self-pay | Admitting: Internal Medicine

## 2023-03-15 VITALS — BP 128/82 | HR 87 | Ht 75.0 in | Wt 189.6 lb

## 2023-03-15 DIAGNOSIS — E1165 Type 2 diabetes mellitus with hyperglycemia: Secondary | ICD-10-CM | POA: Diagnosis not present

## 2023-03-15 DIAGNOSIS — R7989 Other specified abnormal findings of blood chemistry: Secondary | ICD-10-CM

## 2023-03-15 DIAGNOSIS — E1142 Type 2 diabetes mellitus with diabetic polyneuropathy: Secondary | ICD-10-CM

## 2023-03-15 DIAGNOSIS — M86171 Other acute osteomyelitis, right ankle and foot: Secondary | ICD-10-CM | POA: Diagnosis not present

## 2023-03-15 MED ORDER — FARXIGA 10 MG PO TABS: 10.0000 mg | ORAL_TABLET | Freq: Every day | ORAL | 3 refills | Status: DC

## 2023-03-15 MED ORDER — TOUJEO SOLOSTAR 300 UNIT/ML ~~LOC~~ SOPN: 30.0000 [IU] | PEN_INJECTOR | Freq: Every day | SUBCUTANEOUS | 3 refills | Status: DC

## 2023-03-15 MED ORDER — ACCU-CHEK GUIDE VI STRP: ORAL_STRIP | 3 refills | Status: DC

## 2023-03-15 MED ORDER — INSULIN PEN NEEDLE 32G X 4 MM MISC: 3 refills | Status: AC

## 2023-03-15 MED ORDER — ACCU-CHEK SOFTCLIX LANCETS MISC: 3 refills | Status: DC

## 2023-03-15 MED ORDER — ACCU-CHEK GUIDE W/DEVICE KIT: PACK | 0 refills | Status: DC

## 2023-03-15 MED ORDER — GVOKE HYPOPEN 1-PACK 1 MG/0.2ML ~~LOC~~ SOAJ: SUBCUTANEOUS | 99 refills | Status: AC

## 2023-03-15 NOTE — Progress Notes (Signed)
Patient ID: [name], male   DOB: 04/18/1996, 27 y.o.   MRN: [id number]   HPI: [name] is a 27 y.o.-year-old male, initially referred by his PCP, Dr. Angelia Mould returning for follow-up for DM, dx at 27 years old, insulin-dependent since 27 y/o, uncontrolled, with complications (autonomic neuropathy, PN, microalbuminuria, DR).  She saw Dr. Dorris Fetch in the past. He is here with his mother who offers part of the history, especially regarding his past medical history, medication regimen and blood sugars.  Last visit 3.5 months ago. He will restart M'aid 11/23/2022. He will also keep Sanders.  Interim history: No increased urination, blurry vision, nausea, chest pain. He gained 40 lbs  before last OV.  Sugars worsened so PCP started him on Trulicity before last visit.  We changed to Ozempic.  He is tolerating this well.  He lost approximately 20 pounds since then. At last visit he had a left foot ulcer and was seeing podiatry.  Since then, he developed acute osteomyelitis of right foot and he had I&D + had resection of distal half of the 4th toe and placement of Stimulan beads. This is healing well. He is followed by podiatry.  Reviewed HbA1c: Lab Results  Component Value Date   HGBA1C 6.0 (H) 02/15/2023   HGBA1C 8.4 (A) 11/14/2022   HGBA1C 5.3 07/31/2022   HGBA1C 6.9 (A) 03/27/2022   HGBA1C 10.5 (H) 05/20/2021   HGBA1C 11.4 (A) 10/18/2020   HGBA1C 9.7 (H) 03/17/2019   HGBA1C 8.6 (H) 11/18/2018   HGBA1C 12.5 08/14/2018   HGBA1C 13 02/11/2017  10/25/2022: HbA1c 7.4% 01/10/2022: HbA1c 6.5% 08/2020: HbA1c 12.1% 04/2020: HbA1c 11.5%  Previously on: - Metformin ER 500 mg 2x a day, with meals - Victoza 1.2 mg daily in am - started by PCP on 09/28/2020 - no nausea - Lantus 77 units at bedtime >> split 35 units 2x a day on 09/28/2020 He was on Humalog 7-7-9 units before meals  - stopped when started Victoza Metformin IR >> nausea.  I recommended the following regimen: - Farxiga 10  before b'fast  -started 10/2021 -  >> off [id number] >> started Trulicity [id number] mg weekly per PCP >> Ozempic 1 mg weekly - Lantus 35 >> 32-34 units 2x a day >> 25 units in a.m. and 40 units at night >> 20 units in am and 26 units at night >> 10 units 2x a day >> 24-26 units 2x a day He was previously on metformin but developed nausea and stomach pain. He was previously on Humalog but this was stopped 10/2021 due to low blood sugars. Previously on Victoza, but this was stopped when switching to Ozempic.  Pt checks his sugars 2-3x a day: - am: 300-317 >> 140-160, 185 >> 40s-87, 93, 120 >> 336 >> 40s, 80-90s - 2h after b'fast: n/c >> 182 >>. N/c - before lunch: 250 >> 120-140 >> 40s-80s >> 145 >> 80-120 - 2h after lunch: n/c >> 231-253 >> n/c - before dinner: 250 >> 140-160 >> 40s-80s >> n/c - 2h after dinner: n/c >> 359, 371 >> n/c - bedtime: 200-310 >> n/c >> 181, 196 >> 80-120 - nighttime: n/c Lowest sugar was 200 >> 40 (with Humalog), 110 >> 40 >> 145 >> 40s x1; ?  At which level he has hypoglycemia awareness. Highest sugar was 350 >> 210 >> 120 >> 371 >> 173.  Glucometer: Accu-Chek >> ReliOn   Pt's meals are: - Breakfast: if eats b'fast: cornflakes + milk - Lunch: half  a sandwich - Dinner: meat + veggies + starch - home cooked - Snacks: not usually Stopped sweet tea.  No regular sodas.  - no CKD but he has a history of microalbuminuria, last BUN/creatinine:  Lab Results  Component Value Date   BUN 11 02/18/2023   BUN 10 02/17/2023   CREATININE 0.78 02/18/2023   CREATININE 0.77 02/17/2023   ACR: No results found for: "MICRALBCREAT" 10/25/2022: 15 07/07/2022: ACR 13 10/19/2015: ACR 78.8 07/28/2012: ACR 41.8 Not on ACE inhibitor/ARB.  -+ HL; last set of lipids: 10/25/2022: 123/168/60/38 07/07/2021: 106/152/33/50 10/19/2015: 202/110/63/117 No results found for: "CHOL", "HDL", "LDLCALC", "LDLDIRECT", "TRIG", "CHOLHDL" On Lipitor 10.  - last eye exam was in 2023. + DR  reportedly.   - + numbness and tingling in his feet.  He has peripheral neuropathy for which she sees podiatry and neurology.  Last foot exam was 12/06/2022: Dr. Loel Lofty He had EMG/NCV: Message from Star Age, MD sent at 03/13/2022  5:12 PM EDT ----- Please call patient is him that his recent electrical nerve and muscle test through our office confirms the suspicion of nerve damage, likely from his underlying diabetes.  As discussed, I would recommend ongoing strict diabetes control.  For now, I recommend he follow-up with his endocrinologist (diabetes specialist) and primary care physician closely.  Prev. On Reglan. On ASA 81.  Pt has FH of DM in MGM, MGGM, PGF - prediabetes.  He also has a history of HTN, cardiac murmur.  He had tachycardia and palpitations due to autoimmune.  He was started on carvedilol after an ED visit in 2019.  He continued to have shortness of breath and exertional fatigue. Stress test, 2D Echo and heart monitor >> normal in 2021.    He was  found to have slightly low TSH levels: Lab Results  Component Value Date   TSH 1.54 11/14/2022   TSH 0.31 (L) 07/31/2022   TSH 0.317 (L) 02/08/2022   TSH 0.277 (L) 06/07/2021   TSH 0.62 03/17/2019   TSH 1.02 11/18/2018   TSH 0.517 08/26/2018   TSH 0.41 08/14/2018   TSH 0.658 08/10/2018   Lab Results  Component Value Date   TSI <89 07/31/2022      2022-04-11    THYROGLOBULIN ANTIBODIES <1   < or = 1  THYROID PEROXIDASE ANTIBODIES 11   <9  TRAb (TSH Receptor Binding Antibody)   2022-04-11    TRAB <1.00   <=2.00  TSH+FREE T4   2022-04-11    TSH 0.31   0.40-4.50  T4, FREE 1.0   0.8-1.8   Further labs reviewed per  records from PCP: ACTH, PLASMA   2022-07-09    ACTH, PLASMA 19   6-50  CORTISOL, A.M.   2022-07-09    CORTISOL, A.M. 10.3      He is unemployed.  Applying for disability.  ROS: + see HPI  Past Medical History:  Diagnosis Date   Complication of anesthesia    Hard to wake up when he was  younger once.   Diabetes mellitus, type II ([place])    Heart murmur    Hypertension    Past Surgical History:  Procedure Laterality Date   AMPUTATION Right 05/21/2021   Procedure: AMPUTATION RAY 5th;  Surgeon: Trula Slade, DPM;  Location: [place];  Service: Podiatry;  Laterality: Right;   BONE BIOPSY Right 02/15/2023   Procedure: BONE BIOPSY;  Surgeon: Yevonne Pax, DPM;  Location: [place];  Service: Podiatry;  Laterality: Right;  INCISION AND DRAINAGE Right 02/15/2023   Procedure: INCISION AND DRAINAGE OF RIGHT FOOT WITH FOURTH TOE HEAD RESECTION AND STIMULAN BEADS;  Surgeon: Yevonne Pax, DPM;  Location: [place];  Service: Podiatry;  Laterality: Right;   PALATE / UVULA BIOPSY / EXCISION     "growth removed"   UPPER GASTROINTESTINAL ENDOSCOPY     Done in [place] - 2013 ish   WOUND DEBRIDEMENT Right 05/24/2021   Procedure: RIGHT FOOT WOUND DEBRIDEMENT AND CLOSURE;  Surgeon: Evelina Bucy, DPM;  Location: [place];  Service: Podiatry;  Laterality: Right;   Social History   Socioeconomic History   Marital status: Single    Spouse name: Not on file   Number of children: Not on file   Years of education: Not on file   Highest education level: Not on file  Occupational History   Not on file  Tobacco Use   Smoking status: Never    Passive exposure: Yes   Smokeless tobacco: Never  Vaping Use   Vaping Use: Never used  Substance and Sexual Activity   Alcohol use: No   Drug use: No   Sexual activity: Not on file  Other Topics Concern   Not on file  Social History Narrative   Not on file   Social Determinants of Health   Financial Resource Strain: Not on file  Food Insecurity: Not on file  Transportation Needs: Not on file  Physical Activity: Not on file  Stress: Not on file  Social Connections: Not on file  Intimate Partner Violence: Not on file   Current Outpatient Medications on File Prior to Visit  Medication Sig Dispense Refill   aspirin EC 81 MG  tablet Take 1 tablet (81 mg total) by mouth daily.     [START ON 03/30/2023] atorvastatin (LIPITOR) 10 MG tablet Take 1 tablet (10 mg total) by mouth daily. Hold while on Daptomycin therapy - can resume taking 03/30/23 30 tablet 0   blood glucose meter kit and supplies KIT Dispense based on patient and insurance preference. Use up to four times daily as directed. (FOR ICD-10 E11.65). 1 each 5   busPIRone (BUSPAR) 5 MG tablet Take 5 mg by mouth 2 (two) times daily.     daptomycin (CUBICIN) IVPB Inject 700 mg into the vein daily. Indication:  MRSA DFI/osteo First Dose: Yes Last Day of Therapy:  03/29/23 Labs - Once weekly:  CBC/D, BMP, and CPK Labs - Every other week:  ESR and CRP Method of administration: IV Push Pull PICC line at the completion of IV therapy Method of administration may be changed at the discretion of home infusion pharmacist based upon assessment of the patient and/or caregiver's ability to self-administer the medication ordered. 38 Units 0   Dulaglutide (TRULICITY) 1.5 0000000 SOPN Inject 1.5 mg into the skin once a week. (Patient not taking: Reported on 03/07/2023) 2 mL 11   DULoxetine (CYMBALTA) 30 MG capsule Take 30 mg by mouth daily.     FARXIGA 10 MG TABS tablet Take 10 mg by mouth daily.     GVOKE HYPOPEN 1-PACK 1 MG/0.2ML SOAJ Inject 1 mg under skin as needed for hypoglycemia (Patient not taking: Reported on 03/07/2023) 0.2 mL prn   hydrOXYzine (ATARAX) 50 MG tablet Take 50 mg by mouth daily as needed for anxiety, itching, nausea or vomiting.     ibuprofen (ADVIL) 800 MG tablet Take 800 mg by mouth 3 (three) times daily as needed for headache, mild pain or moderate pain.  insulin glargine (LANTUS SOLOSTAR) 100 UNIT/ML Solostar Pen Inject 20 Units into the skin daily. (Patient taking differently: Inject 26 Units into the skin 2 (two) times daily.) 45 mL 3   Insulin Pen Needle (CARETOUCH PEN NEEDLES) 31G X 6 MM MISC Use 6x a day 400 each 3   Lancet Devices (ACCU-CHEK SOFTCLIX)  lancets Use as instructed 4 x daily. e11.65 150 each 5   Lancets (ACCU-CHEK SOFT TOUCH) lancets Use as instructed 100 each 12   metoprolol succinate (TOPROL-XL) 50 MG 24 hr tablet Take 1 tablet (50 mg total) by mouth at bedtime. Take with or immediately following a meal. 30 tablet 0   nitroGLYCERIN (NITROSTAT) 0.4 MG SL tablet Place 1 tablet (0.4 mg total) under the tongue every 5 (five) minutes as needed for chest pain. 25 tablet 3   OZEMPIC, 1 MG/DOSE, 4 MG/3ML SOPN Inject into the skin.     senna-docusate (SENOKOT-S) 8.6-50 MG tablet Take 1 tablet by mouth at bedtime as needed for mild constipation.     zolpidem (AMBIEN) 10 MG tablet Take 10 mg by mouth at bedtime.     No current facility-administered medications on file prior to visit.   Allergies  Allergen Reactions   Amoxicillin Hives    Has patient had a PCN reaction causing immediate rash, facial/tongue/throat swelling, SOB or lightheadedness with hypotension: No Has patient had a PCN reaction causing severe rash involving mucus membranes or skin necrosis: No Has patient had a PCN reaction that required hospitalization: No Has patient had a PCN reaction occurring within the last 10 years: No If all of the above answers are "NO", then may proceed with Cephalosporin use.    Lexapro [Escitalopram] Other (See Comments)   Family History  Problem Relation Age of Onset   Healthy Mother    Healthy Father    Healthy Sister    Healthy Sister    Diabetes Maternal Grandmother    Thyroid disease Maternal Grandmother    Hypertension Maternal Grandmother    Neuropathy Maternal Grandmother     PE: BP 128/82 (BP Location: Left Arm, Patient Position: Sitting, Cuff Size: Normal)   Pulse 87   Ht 6\' 3"  (1.905 m)   Wt 189 lb 9.6 oz (86 kg)   SpO2 99%   BMI 23.70 kg/m  Wt Readings from Last 3 Encounters:  03/15/23 189 lb 9.6 oz (86 kg)  03/07/23 192 lb (87.1 kg)  02/15/23 199 lb (90.3 kg)   Constitutional: Normal weight, in  NAD Eyes:no exophthalmos ENT: no thyromegaly, no cervical lymphadenopathy Cardiovascular: Tachycardia, RR, No MRG Respiratory: CTA B Musculoskeletal: no deformities Skin:  + macular rash bilateral shins and feet Neurological: no tremor with outstretched hands  ASSESSMENT: 1. DM2, insulin-dependent, uncontrolled, with complications - DR - Autonomic neuropathy - PN - Microalbuminuria  Component     Latest Ref Rng 07/31/2022  Glucose     65 - 99 mg/dL 67   Glutamic Acid Decarb Ab     <5 IU/mL <5   Islet Cell Ab     Neg:<1:1  Negative   C-Peptide     0.80 - 3.85 ng/mL 0.76 (L)   ZNT8 Antibodies     <15 U/mL <10   Labs confirm type 1 diabetes with low insulin production.  2.  Weight loss  3.  Hashimoto's thyroiditis  PLAN:  1. Patient with longstanding, uncontrolled, insulin-dependent diabetes, with dramatic weight loss and diabetes improvement last year, when sugars started to drop to the 40s, and HbA1c  returned at 5.6%, and he lost 65 pounds.  PCP took him off Ozempic and I advised him to stay off Ozempic and reduce his Lantus dose.  We checked him for insulin deficiency and his C-peptide was low while the antipancreatic antibodies were not elevated.  Since then, sugars started to increase and they were higher than target at last visit.  PCP started him on Trulicity 3 to 4 weeks prior to our last visit.  He was tolerating it well, without significantly decreased appetite.  I advised him to increase Trulicity but since then, will switch to Ozempic.  Latest HbA1c was much better, at 6.0% 1 month ago. -At today's visit, sugars are at goal or lower, whenever he checks.  He had rare blood sugars in the 40s.  At today's visit, we will reduce his Lantus dose from 24 units twice a day to 15 units twice a day.  However, I did send a prescription for Toujeo to his pharmacy to see if he can start a more concentrated insulin and inject just once a day.  If not, will use the BID Lantus dose.  Since  he tolerates Ozempic well, we will continue with this.  However, if he needs to lose weight, will need to reduce the dose or even come off. - I suggested to:  Patient Instructions  Please continue: - Farxiga 10 mg daily - Ozempic 1 mg weekly  Decrease: - Lantus 15 units in am and  15 units at night  Please stop at the lab.  Please return in 4 months with your sugar log.   - advised to check sugars at different times of the day - 4x a day, rotating check times - advised for yearly eye exams >> he is UTD - return to clinic in 4 months  2.  Weight loss -He lost a significant amount of weight: 65 pounds before the last 2 visits (10 pounds before last visit) after starting Ozempic -He was checked for adrenal insufficiency by PCP and had a normal a.m. cortisol and ACTH -He has a history of slightly low TSH levels -He gained 40 pounds before last visit.  PCP started him on Trulicity.  We since switched to Ozempic. He lost 20 lbs since last OV.  No nausea or other GI symptoms.  3.  Hashimoto's thyroiditis -Please also see problem #2 -His TPO antibodies were slightly positive and TRAb antibodies were not elevated -At last visit, TFTs were normal except for a slightly elevated free T3 -We discussed at that time that he could have mild Graves' disease versus thyrotoxic phase of Hashimoto's thyroiditis -Will repeat his TFTs at next visit  Philemon Kingdom, MD PhD [facility] End[facility]

## 2023-03-15 NOTE — Progress Notes (Signed)
Pharmacy called to advise [name] is preferred by insurance. Verbal provided over the phone to pharmacist.

## 2023-03-15 NOTE — Patient Instructions (Addendum)
Please continue: - Farxiga 10 mg daily - Ozempic 1 mg weekly  Decrease: - Lantus 15 units in am and  15 units at night  Please stop at the lab.  Please return in 4 months with your sugar log.

## 2023-03-15 NOTE — Addendum Note (Signed)
Addended by: Lauralyn Primes on: 03/15/2023 11:05 AM   Modules accepted: Orders

## 2023-03-16 DIAGNOSIS — M86171 Other acute osteomyelitis, right ankle and foot: Secondary | ICD-10-CM | POA: Diagnosis not present

## 2023-03-16 DIAGNOSIS — B9562 Methicillin resistant Staphylococcus aureus infection as the cause of diseases classified elsewhere: Secondary | ICD-10-CM | POA: Diagnosis not present

## 2023-03-17 DIAGNOSIS — M86171 Other acute osteomyelitis, right ankle and foot: Secondary | ICD-10-CM | POA: Diagnosis not present

## 2023-03-18 DIAGNOSIS — M86171 Other acute osteomyelitis, right ankle and foot: Secondary | ICD-10-CM | POA: Diagnosis not present

## 2023-03-19 DIAGNOSIS — M86171 Other acute osteomyelitis, right ankle and foot: Secondary | ICD-10-CM | POA: Diagnosis not present

## 2023-03-20 DIAGNOSIS — M86171 Other acute osteomyelitis, right ankle and foot: Secondary | ICD-10-CM | POA: Diagnosis not present

## 2023-03-21 ENCOUNTER — Ambulatory Visit (INDEPENDENT_AMBULATORY_CARE_PROVIDER_SITE_OTHER): Payer: BC Managed Care – PPO | Admitting: Podiatry

## 2023-03-21 ENCOUNTER — Encounter: Payer: Self-pay | Admitting: Podiatry

## 2023-03-21 DIAGNOSIS — L84 Corns and callosities: Secondary | ICD-10-CM

## 2023-03-21 DIAGNOSIS — Z794 Long term (current) use of insulin: Secondary | ICD-10-CM

## 2023-03-21 DIAGNOSIS — Z9889 Other specified postprocedural states: Secondary | ICD-10-CM

## 2023-03-21 DIAGNOSIS — Z89431 Acquired absence of right foot: Secondary | ICD-10-CM

## 2023-03-21 DIAGNOSIS — L97512 Non-pressure chronic ulcer of other part of right foot with fat layer exposed: Secondary | ICD-10-CM

## 2023-03-21 DIAGNOSIS — M86171 Other acute osteomyelitis, right ankle and foot: Secondary | ICD-10-CM | POA: Diagnosis not present

## 2023-03-21 DIAGNOSIS — E0865 Diabetes mellitus due to underlying condition with hyperglycemia: Secondary | ICD-10-CM

## 2023-03-21 NOTE — Progress Notes (Signed)
  Subjective:  Patient ID: [name], male    DOB: 1996-08-05,  MRN: [id number]  Chief Complaint  Patient presents with   Routine Post Op    DOS 2.23.24 RIGHT FOOT WITH FOURTH TOE HEAD RESECTION AND STIMULAN BEADS  "Its looking a whole lot better"    DOS: 02/15/2023 Procedure: Right foot fourth metatarsal head excision incision and drainage of abscess and partial closure of ulceration with application of antibiotic beads  27 y.o. male returns for post-op check. Patient reports he has been doing well since last apt. He has a PICC line in place and is getting the IV antibiotics per infectious disease.  He has been doing daily dressing changes with Betadine and gauze dressing.  Thinks area is getting better denies drainage.  Denies nausea vomiting fever chills overall feels that he is improving he has been using the postoperative shoe on the right foot. Also notes the left foot 5th MPJ area has not been draining.   Review of Systems: Negative except as noted in the HPI. Denies N/V/F/Ch.   Objective:  There were no vitals filed for this visit. There is no height or weight on file to calculate BMI. Constitutional Well developed. Well nourished.  Vascular Foot warm and well perfused. Capillary refill normal to all digits.  Calf is soft and supple, no posterior calf or knee pain, negative Homans' sign  Neurologic Normal speech. Oriented to person, place, and time. Epicritic sensation to light touch grossly absent bilaterally  Dermatologic Skin healing well without signs of infection. Skin edges well coapted without signs of infection.  Plantar ulceration that was previously under the fourth metatarsal head is healing well with no sign of infection.  At the dorsal incision it is well coapted and healing with dry eschar.   Left foot with hyperkeratotic lesion sub 5th met head. No open ulceration present.   Orthopedic: Nontender to palpation noted about the surgical site.   Multiple view  plain film radiographs: Deferred at this visit. Assessment:   1. Post-operative state   2. Right foot ulcer, with fat layer exposed ([place])   3. Pre-ulcerative calluses   4. History of amputation of right foot ([place])   5. Diabetes mellitus due to underlying condition with hyperglycemia, with long-term current use of insulin ([place])     Plan:  Patient was evaluated and treated and all questions answered.  S/p foot surgery right foot fourth metatarsal head resection antibiotic bead application -Overall continues to improve with local wound care and prior resection of bone causing pressure sub 4th met head -Progressing as expected post-operatively.  Will continue with local wound care until fully healed -XR: As above no acute postoperative complication -WB Status: Weightbearing as tolerated in postoperative shoe  - Pt would benefit from custom DM shoes and liners and now that wounds have nearly fully healed will proceed with order for these and he will scheudle apt to be fitted -Medications: Continue IV antibiotics per infectious disease -Foot redressed with Betadine and adhesive padded bandage.  - Callus debrided sub 5th MPJ left foot  Return in about 3 weeks (around 04/11/2023) for F/u R 5th MPj wound.         Everitt Amber, DPM [facility]

## 2023-03-22 DIAGNOSIS — M86171 Other acute osteomyelitis, right ankle and foot: Secondary | ICD-10-CM | POA: Diagnosis not present

## 2023-03-23 DIAGNOSIS — M86171 Other acute osteomyelitis, right ankle and foot: Secondary | ICD-10-CM | POA: Diagnosis not present

## 2023-03-24 DIAGNOSIS — M86171 Other acute osteomyelitis, right ankle and foot: Secondary | ICD-10-CM | POA: Diagnosis not present

## 2023-03-25 DIAGNOSIS — M86171 Other acute osteomyelitis, right ankle and foot: Secondary | ICD-10-CM | POA: Diagnosis not present

## 2023-03-26 DIAGNOSIS — B9562 Methicillin resistant Staphylococcus aureus infection as the cause of diseases classified elsewhere: Secondary | ICD-10-CM | POA: Diagnosis not present

## 2023-03-26 DIAGNOSIS — M86171 Other acute osteomyelitis, right ankle and foot: Secondary | ICD-10-CM | POA: Diagnosis not present

## 2023-03-27 DIAGNOSIS — M86171 Other acute osteomyelitis, right ankle and foot: Secondary | ICD-10-CM | POA: Diagnosis not present

## 2023-03-28 DIAGNOSIS — M86171 Other acute osteomyelitis, right ankle and foot: Secondary | ICD-10-CM | POA: Diagnosis not present

## 2023-03-29 DIAGNOSIS — M86171 Other acute osteomyelitis, right ankle and foot: Secondary | ICD-10-CM | POA: Diagnosis not present

## 2023-04-01 ENCOUNTER — Other Ambulatory Visit: Payer: Self-pay | Admitting: Internal Medicine

## 2023-04-08 ENCOUNTER — Other Ambulatory Visit: Payer: Self-pay | Admitting: Internal Medicine

## 2023-04-08 DIAGNOSIS — E1142 Type 2 diabetes mellitus with diabetic polyneuropathy: Secondary | ICD-10-CM

## 2023-04-10 ENCOUNTER — Ambulatory Visit: Payer: BC Managed Care – PPO | Admitting: Internal Medicine

## 2023-04-11 ENCOUNTER — Ambulatory Visit: Payer: BC Managed Care – PPO | Admitting: Podiatry

## 2023-04-30 DIAGNOSIS — E1140 Type 2 diabetes mellitus with diabetic neuropathy, unspecified: Secondary | ICD-10-CM | POA: Diagnosis not present

## 2023-04-30 DIAGNOSIS — R946 Abnormal results of thyroid function studies: Secondary | ICD-10-CM | POA: Diagnosis not present

## 2023-04-30 DIAGNOSIS — K219 Gastro-esophageal reflux disease without esophagitis: Secondary | ICD-10-CM | POA: Diagnosis not present

## 2023-04-30 DIAGNOSIS — E108 Type 1 diabetes mellitus with unspecified complications: Secondary | ICD-10-CM | POA: Diagnosis not present

## 2023-05-03 ENCOUNTER — Ambulatory Visit (INDEPENDENT_AMBULATORY_CARE_PROVIDER_SITE_OTHER): Payer: BC Managed Care – PPO | Admitting: Podiatry

## 2023-05-03 DIAGNOSIS — L84 Corns and callosities: Secondary | ICD-10-CM | POA: Diagnosis not present

## 2023-05-03 DIAGNOSIS — Z9889 Other specified postprocedural states: Secondary | ICD-10-CM

## 2023-05-03 DIAGNOSIS — Q6671 Congenital pes cavus, right foot: Secondary | ICD-10-CM

## 2023-05-03 DIAGNOSIS — Z794 Long term (current) use of insulin: Secondary | ICD-10-CM

## 2023-05-03 DIAGNOSIS — L03115 Cellulitis of right lower limb: Secondary | ICD-10-CM

## 2023-05-03 DIAGNOSIS — L97512 Non-pressure chronic ulcer of other part of right foot with fat layer exposed: Secondary | ICD-10-CM

## 2023-05-03 DIAGNOSIS — E0865 Diabetes mellitus due to underlying condition with hyperglycemia: Secondary | ICD-10-CM

## 2023-05-03 DIAGNOSIS — Q6672 Congenital pes cavus, left foot: Secondary | ICD-10-CM

## 2023-05-03 NOTE — Progress Notes (Signed)
  Subjective:  Patient ID: [name], male    DOB: Oct 04, 1996,  MRN: [id number]  Chief Complaint  Patient presents with   Wound Check    DOS: 02/15/2023 Procedure: Right foot fourth metatarsal head excision incision and drainage of abscess and partial closure of ulceration with application of antibiotic beads  27 y.o. male returns for post-op check. Patient reports he has been doing well since last apt. He has a PICC line in place and is getting the IV antibiotics per infectious disease.  He has been doing daily dressing changes with Betadine and gauze dressing.  Thinks area is getting better denies drainage.  Denies nausea vomiting fever chills overall feels that he is improving he has been using the postoperative shoe on the right foot. Also notes the left foot 5th MPJ area has not been draining.   Review of Systems: Negative except as noted in the HPI. Denies N/V/F/Ch.   Objective:  There were no vitals filed for this visit. There is no height or weight on file to calculate BMI. Constitutional Well developed. Well nourished.  Vascular Foot warm and well perfused. Capillary refill normal to all digits.  Calf is soft and supple, no posterior calf or knee pain, negative Homans' sign  Neurologic Normal speech. Oriented to person, place, and time. Epicritic sensation to light touch grossly absent bilaterally  Dermatologic Skin healing well without signs of infection. Skin edges well coapted without signs of infection.  Plantar ulceration that was previously under the fourth metatarsal head is healing well with no sign of infection.  At the dorsal incision it is well coapted and healing with dry eschar.   Left foot with hyperkeratotic lesion sub 5th met head. No open ulceration present.   Orthopedic: Nontender to palpation noted about the surgical site.   Multiple view plain film radiographs: Deferred at this visit. Assessment:   1. Post-operative state   2. Right foot ulcer,  with fat layer exposed ([facility])   3. Pre-ulcerative calluses   4. Cellulitis of right foot   5. Diabetes mellitus due to underlying condition with hyperglycemia, with long-term current use of insulin ([facility])      Plan:  Patient was evaluated and treated and all questions answered.  S/p foot surgery right foot fourth metatarsal head resection antibiotic bead application -Overall continues to improve with local wound care and prior resection of bone causing pressure sub 4th met head -Progressing as expected post-operatively.  Will continue with local wound care until fully healed -XR: As above no acute postoperative complication -WB Status: Weightbearing as tolerated in postoperative shoe  - Pt would benefit from custom DM shoes and liners and now that wounds have nearly fully healed will proceed with order for these and he will scheudle apt to be fitted -Medications: Continue IV antibiotics per infectious disease -Foot redressed with Betadine and adhesive padded bandage.  - Callus debrided sub 5th MPJ left foot  No follow-ups on file.         Corinna Gab, DPM [facility]

## 2023-05-03 NOTE — Progress Notes (Signed)
  Subjective:  Patient ID: [name], male    DOB: 1996/05/21,  MRN: [id number]  Chief Complaint  Patient presents with   Wound Check    Right 5th MPj wound.       Callouses    Callus lateral aspect of left foot. Patient is diabetic.     DOS: 02/15/2023 Procedure: Right foot fourth metatarsal head excision incision and drainage of abscess and partial closure of ulceration with application of antibiotic beads  27 y.o. male returns for post-op check.  Patient is now approximately 70-month status post right fourth metatarsal head excision with I&D and partial closure of the ulceration.  He is fully healed the amputation site at this time and there is no issue with the fourth met head excision site.  He does note a callus on the left foot under the fifth metatarsal head which has been there for a long time.  Just wanted to come in to make sure there is no ulcer under that area and also get diabetic shoes and liners.  Review of Systems: Negative except as noted in the HPI. Denies N/V/F/Ch.   Objective:  There were no vitals filed for this visit. There is no height or weight on file to calculate BMI. Constitutional Well developed. Well nourished.  Vascular Foot warm and well perfused. Capillary refill normal to all digits.  Calf is soft and supple, no posterior calf or knee pain, negative Homans' sign  Neurologic Normal speech. Oriented to person, place, and time. Epicritic sensation to light touch grossly absent bilaterally  Dermatologic On the right foot the prior wound and amputation site of the fourth metatarsal head is fully healed there is no opening there at all well-healed with no sign of residual infection  Left foot with hyperkeratotic lesion sub 5th met head. No open ulceration present.    Orthopedic: Nontender to palpation noted about the surgical site.   Multiple view plain film radiographs: Deferred at this visit. Assessment:   1. Post-operative state   2. Right  foot ulcer, with fat layer exposed (HCC)   3. Pre-ulcerative calluses   4. Cellulitis of right foot   5. Pes cavus of both feet   6. Diabetes mellitus due to underlying condition with hyperglycemia, with long-term current use of insulin (HCC)      Plan:  Patient was evaluated and treated and all questions answered.  S/p foot surgery right foot fourth metatarsal head resection antibiotic bead application -Overall patient is now fully healed in regards to the right foot prior wound.  There is no evidence of residual infection -Continue with offloading but he can go back to regular shoes. -XR: As above no acute postoperative complication -WB Status: Weightbearing as tolerated in regular shoes with this time - Pt would benefit from custom DM shoes and liners and now that wounds have nearly fully healed will proceed with order for these and he will scheudle apt to be fitted -Medications: No need for further antibiotics -No dressing care needed for right foot  # Preulcerative callus subfifth metatarsal head left foot All symptomatic hyperkeratoses x1 were safely debrided with a sterile #15 blade to patient's level of comfort without incident. We discussed preventative and palliative care of these lesions including supportive and accommodative shoegear, padding, prefabricated and custom molded accommodative orthoses, use of a pumice stone and lotions/creams daily.         Corinna Gab, DPM [facility] / [facility]

## 2023-05-23 ENCOUNTER — Other Ambulatory Visit: Payer: Self-pay | Admitting: Internal Medicine

## 2023-05-23 DIAGNOSIS — E1165 Type 2 diabetes mellitus with hyperglycemia: Secondary | ICD-10-CM

## 2023-05-27 ENCOUNTER — Ambulatory Visit (INDEPENDENT_AMBULATORY_CARE_PROVIDER_SITE_OTHER): Payer: BC Managed Care – PPO | Admitting: Podiatry

## 2023-05-27 ENCOUNTER — Encounter (HOSPITAL_BASED_OUTPATIENT_CLINIC_OR_DEPARTMENT_OTHER): Payer: Self-pay | Admitting: Internal Medicine

## 2023-05-27 DIAGNOSIS — L84 Corns and callosities: Secondary | ICD-10-CM

## 2023-05-27 DIAGNOSIS — E0865 Diabetes mellitus due to underlying condition with hyperglycemia: Secondary | ICD-10-CM

## 2023-05-27 DIAGNOSIS — Z794 Long term (current) use of insulin: Secondary | ICD-10-CM

## 2023-05-27 MED ORDER — METOPROLOL SUCCINATE ER 50 MG PO TB24: 50.0000 mg | ORAL_TABLET | Freq: Every day | ORAL | 0 refills | Status: DC

## 2023-05-27 NOTE — Progress Notes (Signed)
Patient presents to the office today for diabetic shoe and insole measuring.  Patient was measured with brannock device to determine size and width for 1 pair of extra depth shoes and foam casted for 3 pair of insoles.   ABN signed.   Documentation of medical necessity will be sent to patient's treating diabetic doctor to verify and sign.   Patient's diabetic provider: Ernest Haber  Shoes and insoles will be ordered at that time and patient will be notified for an appointment for fitting when they arrive.   Brannock measurement: 13.5  Patient shoe selection-   1st   Shoe choice:   [id number]  2nd  Shoe choice:   [id number]  Shoe size ordered: 13.5

## 2023-06-06 DIAGNOSIS — G4700 Insomnia, unspecified: Secondary | ICD-10-CM | POA: Diagnosis not present

## 2023-06-06 DIAGNOSIS — F419 Anxiety disorder, unspecified: Secondary | ICD-10-CM | POA: Diagnosis not present

## 2023-06-06 NOTE — Progress Notes (Signed)
Cardiology Office Note    Date:  06/07/2023  ID:  [name], DOB [birth year], MRN [id number] Cardiologist: Previously Dr. Rennis Golden --> Patient prefers to follow-up in [facility] and will switch to Dr. Jenene Slicker  History of Present Illness:    [name] is a 27 y.o. male with past medical history of palpitations (prior monitor in 08/2020 showing no significant arrhythmias), HTN, HLD and IDDM who presents to the office today for overdue follow-up.  He was last examined by Dr. Rennis Golden in 06/2022 and had experienced a 50 pound weight loss with significant improvement in his Hgb A1c down to 6.5. His blood pressure was also soft at 100/70. Given his soft BP, he was advised to stop Imdur as his prior stress test in 2021 had shown evidence of possible infarct with mild to moderate ischemia but it was felt this was likely a false-positive study. Losartan was also discontinued at that time. He had been on Coreg for rate control but given his BP, this was discontinued and he was switched to Toprol-XL 100 mg at night. Was advised to follow-up in 6 months and has not been evaluated by Cardiology since.  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 It appears Toprol-XL was reduced to 50 mg daily.  In talking with the patient today, he reports overall doing well from a cardiac perspective since his last office visit. His activity has been somewhat more limited since his surgery but he is wearing a boot and following closely with Podiatry. He denies any recent chest pain or dyspnea on exertion. No specific orthopnea, PND or pitting edema. Reports his palpitations have overall been well-controlled with Toprol-XL. Says he also significantly reduced his caffeine intake as he was previously consuming 3-4 energy drinks per day. He does not consume  alcohol.  Studies Reviewed:   EKG: EKG is ordered today and demonstrates NSR, HR 99 with no acute ST abnormalities.   Event Monitor: 08/2020 14 day monitor Min HR 70, Max HR 139, Avg HR 90 No symptoms reported Tracings show sinus rhythm, no significant arrhythmias.  NST: 08/2020 There was no ST segment deviation noted during stress. Findings consistent with prior inferior/inferoseptal/inferoapical myocardial infarction with mild to moderate peri-infarct ischemia. Mild anterior ischemia This is an intermediate risk study. The left ventricular ejection fraction is hyperdynamic (>65%). Abnormal stress test in young patient, of note chart review shows Hgb A1c over the last 3 years 9-13 raising possibility for true onset of early coronary artery disease.  Echocardiogram: 08/2020 IMPRESSIONS     1. There is mild chordal SAM in the setting of hyperdynamic LV with mild  subvalvular gradient of 17 mmHg. No significant LV hypertrophy. . Left  ventricular ejection fraction, by estimation, is 65 to 70%. The left  ventricle has normal function. The left  ventricle has no regional wall motion abnormalities. Left ventricular  diastolic parameters were normal.   2. Right ventricular systolic function is normal. The right ventricular  size is normal.   3. The mitral valve is normal in structure. No evidence of mitral valve  regurgitation. No evidence of mitral stenosis.   4. The aortic valve is tricuspid. Aortic valve regurgitation is not  visualized. No aortic stenosis is present.   5. The inferior vena cava is normal in size with greater than 50%  respiratory variability, suggesting right atrial pressure of  3 mmHg.   Physical Exam:   VS:  BP 118/74   Pulse 99   Ht 6\' 3"  (1.905 m)   Wt 183 lb (83 kg)   SpO2 100%   BMI 22.87 kg/m    Wt Readings from Last 3 Encounters:  06/07/23 183 lb (83 kg)  03/15/23 189 lb 9.6 oz (86 kg)  03/07/23 192 lb (87.1 kg)     GEN: Well nourished, well  developed male appearing in no acute distress NECK: No JVD; No carotid bruits CARDIAC: RRR, no murmurs, rubs, gallops RESPIRATORY:  Clear to auscultation without rales, wheezing or rhonchi  ABDOMEN: Appears non-distended. No obvious abdominal masses. EXTREMITIES: No clubbing or cyanosis. No pitting edema.  Distal pedal pulses are 2+ bilaterally. Boot in place along right foot.    Assessment and Plan:   1. Palpitations - Prior monitor in 08/2020 showed no significant arrhythmias. He reports symptoms have overall been well-controlled since his last office visit. Will continue on Toprol-XL 50 mg daily. He was congratulated on his caffeine reduction as I suspect this was a driving factor of his prior palpitations.  2. Abnormal NST - He had a nuclear stress test in 2021 which showed findings consistent with prior infarct and mild to moderate peri-infarct ischemia but given no recurrent anginal symptoms, it was felt this was likely a false positive at the time of his last office visit.  - He denies any recent anginal symptoms, therefore we will continue with risk factor modification at this time. I encouraged him to make [place] aware if he has any recurrent anginal symptoms as a Coronary CT could be arranged if needed.  3. HTN - His blood pressure is well-controlled at 118/74 during today's visit. Continue current medical therapy with Toprol-XL 50 mg daily.  4. HLD - Followed by his PCP.  He remains on Atorvastatin 10 mg daily.   Signed, Ellsworth Lennox, PA-C

## 2023-06-07 ENCOUNTER — Encounter: Payer: Self-pay | Admitting: Student

## 2023-06-07 ENCOUNTER — Telehealth: Payer: Self-pay | Admitting: Podiatry

## 2023-06-07 ENCOUNTER — Ambulatory Visit: Payer: BC Managed Care – PPO | Attending: Student | Admitting: Student

## 2023-06-07 VITALS — BP 118/74 | HR 99 | Ht 75.0 in | Wt 183.0 lb

## 2023-06-07 DIAGNOSIS — R9439 Abnormal result of other cardiovascular function study: Secondary | ICD-10-CM

## 2023-06-07 DIAGNOSIS — R002 Palpitations: Secondary | ICD-10-CM | POA: Diagnosis not present

## 2023-06-07 DIAGNOSIS — I10 Essential (primary) hypertension: Secondary | ICD-10-CM | POA: Diagnosis not present

## 2023-06-07 DIAGNOSIS — E782 Mixed hyperlipidemia: Secondary | ICD-10-CM

## 2023-06-07 MED ORDER — METOPROLOL SUCCINATE ER 50 MG PO TB24: 50.0000 mg | ORAL_TABLET | Freq: Every day | ORAL | 3 refills | Status: DC

## 2023-06-07 NOTE — Patient Instructions (Signed)
Medication Instructions:  Your physician recommends that you continue on your current medications as directed. Please refer to the Current Medication list given to you today.  *If you need a refill on your cardiac medications before your next appointment, please call your pharmacy*   Lab Work: NONE   If you have labs (blood work) drawn today and your tests are completely normal, you will receive your results only by: MyChart Message (if you have MyChart) OR A paper copy in the mail If you have any lab test that is abnormal or we need to change your treatment, we will call you to review the results.   Testing/Procedures: NONE    Follow-Up: At [facility], you and your health needs are our priority.  As part of our continuing mission to provide you with exceptional heart care, we have created designated Provider Care Teams.  These Care Teams include your primary Cardiologist (physician) and Advanced Practice Providers (APPs -  Physician Assistants and Nurse Practitioners) who all work together to provide you with the care you need, when you need it.  We recommend signing up for the patient portal called "MyChart".  Sign up information is provided on this After Visit Summary.  MyChart is used to connect with patients for Virtual Visits (Telemedicine).  Patients are able to view lab/test results, encounter notes, upcoming appointments, etc.  Non-urgent messages can be sent to your provider as well.   To learn more about what you can do with MyChart, go to [email or web address].    Your next appointment:   1 year(s)  Provider:   Vishnu Mallipeddi, MD or Brittany Strader, PA-C    Other Instructions Thank you for choosing [facility]!    

## 2023-06-07 NOTE — Telephone Encounter (Signed)
Lmom for pt to call and schedule picking up diabetic shoes

## 2023-06-07 NOTE — Addendum Note (Signed)
Addended by: Marlyn Corporal A on: 06/07/2023 03:55 PM   Modules accepted: Orders

## 2023-06-21 ENCOUNTER — Ambulatory Visit (INDEPENDENT_AMBULATORY_CARE_PROVIDER_SITE_OTHER): Payer: BC Managed Care – PPO | Admitting: Podiatrist

## 2023-06-21 DIAGNOSIS — Z794 Long term (current) use of insulin: Secondary | ICD-10-CM

## 2023-06-21 DIAGNOSIS — E0865 Diabetes mellitus due to underlying condition with hyperglycemia: Secondary | ICD-10-CM | POA: Diagnosis not present

## 2023-06-21 DIAGNOSIS — Q6671 Congenital pes cavus, right foot: Secondary | ICD-10-CM | POA: Diagnosis not present

## 2023-06-21 DIAGNOSIS — Z89431 Acquired absence of right foot: Secondary | ICD-10-CM

## 2023-06-21 DIAGNOSIS — Q6672 Congenital pes cavus, left foot: Secondary | ICD-10-CM

## 2023-06-21 DIAGNOSIS — E119 Type 2 diabetes mellitus without complications: Secondary | ICD-10-CM

## 2023-06-21 NOTE — Progress Notes (Signed)
   The patient presented to the office to day to pick up diabetic shoes and 3 pr diabetic custom inserts.  1 pr of  inserts were put in the shoes and the shoes were fitted to the patient.   The foot ortheses offered full contact with plantar surface and contoured the arch well.   The shoes fit well with no areas of pressure concern.   Instructions for break in and wear were dispensed as well as instructions for changing out the diabetic insoles every 4 months.    Patient advised to contact us if any problems arise.  Patient also advised on how to report any issues

## 2023-06-28 ENCOUNTER — Encounter: Payer: Self-pay | Admitting: Internal Medicine

## 2023-06-28 ENCOUNTER — Ambulatory Visit (INDEPENDENT_AMBULATORY_CARE_PROVIDER_SITE_OTHER): Payer: BC Managed Care – PPO | Admitting: Internal Medicine

## 2023-06-28 VITALS — BP 120/80 | HR 98 | Ht 75.0 in | Wt 186.0 lb

## 2023-06-28 DIAGNOSIS — E1165 Type 2 diabetes mellitus with hyperglycemia: Secondary | ICD-10-CM | POA: Diagnosis not present

## 2023-06-28 DIAGNOSIS — Z794 Long term (current) use of insulin: Secondary | ICD-10-CM

## 2023-06-28 DIAGNOSIS — R634 Abnormal weight loss: Secondary | ICD-10-CM

## 2023-06-28 DIAGNOSIS — E1142 Type 2 diabetes mellitus with diabetic polyneuropathy: Secondary | ICD-10-CM | POA: Diagnosis not present

## 2023-06-28 DIAGNOSIS — R7989 Other specified abnormal findings of blood chemistry: Secondary | ICD-10-CM | POA: Diagnosis not present

## 2023-06-28 LAB — HEMOGLOBIN A1C: Hemoglobin A1C: 6.2

## 2023-06-28 MED ORDER — OZEMPIC (1 MG/DOSE) 4 MG/3ML ~~LOC~~ SOPN: PEN_INJECTOR | SUBCUTANEOUS | 3 refills | Status: DC

## 2023-06-28 NOTE — Progress Notes (Signed)
Patient ID: [name], male   DOB: [birth year], 27 y.o.   MRN: [id number]   HPI: [name] is a 27 y.o.-year-old male, initially referred by his PCP, Dr. Janeece Riggers returning for follow-up for DM, dx at 27 years old, insulin-dependent since 27 y/o, uncontrolled, with complications (autonomic neuropathy, PN, microalbuminuria, DR).  She saw Dr. Fransico Him in the past. He is here with his mother who offers part of the history, especially regarding his past medical history, medication regimen and blood sugars.  Last visit 4 months ago. He will restart M'aid 11/23/2022. He will also keep [employer].  Interim history: No increased urination, blurry vision, nausea, chest pain. Before last visit, he had a left foot ulcer and was seeing podiatry. He developed acute osteomyelitis of right foot and he had I&D + had resection of distal half of the 4th toe and placement of Stimulan beads.  This helped healing.  Reviewed HbA1c: Lab Results  Component Value Date   HGBA1C 6.0 (H) 02/15/2023   HGBA1C 8.4 (A) 11/14/2022   HGBA1C 5.3 07/31/2022   HGBA1C 6.9 (A) 03/27/2022   HGBA1C 10.5 (H) 05/20/2021   HGBA1C 11.4 (A) 10/18/2020   HGBA1C 9.7 (H) 03/17/2019   HGBA1C 8.6 (H) 11/18/2018   HGBA1C 12.5 08/14/2018   HGBA1C 13 02/11/2017  10/25/2022: HbA1c 7.4% 01/10/2022: HbA1c 6.5% 08/2020: HbA1c 12.1% 04/2020: HbA1c 11.5%  Previously on: - Metformin ER 500 mg 2x a day, with meals - Victoza 1.2 mg daily in am - started by PCP on 09/28/2020 - no nausea - Lantus 77 units at bedtime >> split 35 units 2x a day on 09/28/2020 He was on Humalog 7-7-9 units before meals  - stopped when started Victoza Metformin IR >> nausea.  I recommended the following regimen: - Farxiga 10 before b'fast  -started 10/2021 -  >> Ozempic 1 mg weekly - Lantus 35  units 2x a day >> .Marland Kitchen.10 units 2x a day >> 24-26 >> 15 units 2x a day >> Toujeo 30 units  He was previously on metformin but developed nausea and stomach pain. He was  previously on Humalog but this was stopped 10/2021 due to low blood sugars. Previously on Victoza, but this was stopped when switching to Ozempic.  Pt checks his sugars 1-2x a day: - am:  40s-87, 93, 120 >> 336 >> 40s, 80-90s >> 130-155, 190 (wakes up late, between 12-4 pm) - 2h after b'fast: n/c >> 182 >>. N/c - before lunch: 120-140 >> 40s-80s >> 145 >> 80-120 >> 85-120 - 2h after lunch: n/c >> 231-253 >> n/c  - before dinner: 250 >> 140-160 >> 40s-80s >> n/c >> see above - 2h after dinner: n/c >> 359, 371 >> n/c - bedtime: 200-310 >> n/c >> 181, 196 >> 80-120 >> 60-170, 195 - nighttime: n/c Lowest sugar was  40 >> 145 >> 40s x1 >> 60; ?  At which level he has hypoglycemia awareness. Highest sugar was  371 >> 173 >> 195.  Glucometer: Accu-Chek >> ReliOn   Pt's meals are: - Breakfast: if eats b'fast: cornflakes + milk - Lunch: half a sandwich - Dinner: meat + veggies + starch - home cooked - Snacks: not usually Stopped sweet tea.  No regular sodas.  - no CKD but he has a history of microalbuminuria, last BUN/creatinine:  Lab Results  Component Value Date   BUN 11 02/18/2023   BUN 10 02/17/2023   CREATININE 0.78 02/18/2023   CREATININE 0.77 02/17/2023   ACR: No  results found for: "MICRALBCREAT" 10/25/2022: 15 07/07/2022: ACR 13 10/19/2015: ACR 78.8 07/28/2012: ACR 41.8 Not on ACE inhibitor/ARB.  -+ HL; last set of lipids: 10/25/2022: 123/168/60/38 07/07/2021: 106/152/33/50 10/19/2015: 202/110/63/117 No results found for: "CHOL", "HDL", "LDLCALC", "LDLDIRECT", "TRIG", "CHOLHDL" On Lipitor 10.  - last eye exam was in 2023. + DR reportedly.   - + numbness and tingling in his feet.  He has peripheral neuropathy for which she sees podiatry and neurology.  Last foot exam was 05/03/2023: Dr. Annamary Rummage He had EMG/NCV: Message from Huston Foley, MD sent at 03/13/2022  5:12 PM EDT ----- Please call patient is him that his recent electrical nerve and muscle test through our  office confirms the suspicion of nerve damage, likely from his underlying diabetes.  As discussed, I would recommend ongoing strict diabetes control.  For now, I recommend he follow-up with his endocrinologist (diabetes specialist) and primary care physician closely.  Prev. On Reglan. On ASA 81.  Pt has FH of DM in MGM, MGGM, PGF - prediabetes.  He also has a history of HTN, cardiac murmur.  He had tachycardia and palpitations due to autoimmune.  He was started on carvedilol after an ED visit in 2019.  He continued to have shortness of breath and exertional fatigue. Stress test, 2D Echo and heart monitor >> normal in 2021.    He was  found to have slightly low TSH levels: Lab Results  Component Value Date   TSH 1.54 11/14/2022   TSH 0.31 (L) 07/31/2022   TSH 0.317 (L) 02/08/2022   TSH 0.277 (L) 06/07/2021   TSH 0.62 03/17/2019   TSH 1.02 11/18/2018   TSH 0.517 08/26/2018   TSH 0.41 08/14/2018   TSH 0.658 08/10/2018   Lab Results  Component Value Date   TSI <89 07/31/2022      2022-04-11    THYROGLOBULIN ANTIBODIES <1   < or = 1  THYROID PEROXIDASE ANTIBODIES 11   <9  TRAb (TSH Receptor Binding Antibody)   2022-04-11    TRAB <1.00   <=2.00  TSH+FREE T4   2022-04-11    TSH 0.31   0.40-4.50  T4, FREE 1.0   0.8-1.8   Further labs reviewed per  records from PCP: ACTH, PLASMA   2022-07-09    ACTH, PLASMA 19   6-50  CORTISOL, A.M.   2022-07-09    CORTISOL, A.M. 10.3      He is unemployed.  Applying for disability.  ROS: + see HPI  Past Medical History:  Diagnosis Date   Complication of anesthesia    Hard to wake up when he was younger once.   Diabetes mellitus, type II (HCC)    Heart murmur    Hypertension    Past Surgical History:  Procedure Laterality Date   AMPUTATION Right 05/21/2021   Procedure: AMPUTATION RAY 5th;  Surgeon: Vivi Barrack, DPM;  Location: [facility] OR;  Service: Podiatry;  Laterality: Right;   BONE BIOPSY Right 02/15/2023   Procedure: BONE BIOPSY;   Surgeon: Pilar Plate, DPM;  Location: [facility] OR;  Service: Podiatry;  Laterality: Right;   INCISION AND DRAINAGE Right 02/15/2023   Procedure: INCISION AND DRAINAGE OF RIGHT FOOT WITH FOURTH TOE HEAD RESECTION AND STIMULAN BEADS;  Surgeon: Pilar Plate, DPM;  Location: [facility] OR;  Service: Podiatry;  Laterality: Right;   PALATE / UVULA BIOPSY / EXCISION     "growth removed"   UPPER GASTROINTESTINAL ENDOSCOPY     Done in [place] - 2013  ish   WOUND DEBRIDEMENT Right 05/24/2021   Procedure: RIGHT FOOT WOUND DEBRIDEMENT AND CLOSURE;  Surgeon: Park Liter, DPM;  Location: [facility] OR;  Service: Podiatry;  Laterality: Right;   Social History   Socioeconomic History   Marital status: Single    Spouse name: Not on file   Number of children: Not on file   Years of education: Not on file   Highest education level: Not on file  Occupational History   Not on file  Tobacco Use   Smoking status: Never    Passive exposure: Yes   Smokeless tobacco: Never  Vaping Use   Vaping Use: Never used  Substance and Sexual Activity   Alcohol use: No   Drug use: No   Sexual activity: Not on file  Other Topics Concern   Not on file  Social History Narrative   Not on file   Social Determinants of Health   Financial Resource Strain: Not on file  Food Insecurity: Not on file  Transportation Needs: Not on file  Physical Activity: Not on file  Stress: Not on file  Social Connections: Not on file  Intimate Partner Violence: Not on file   Current Outpatient Medications on File Prior to Visit  Medication Sig Dispense Refill   Accu-Chek Softclix Lancets lancets USE   TO CHECK GLUCOSE TWICE DAILY 100 each 0   aspirin EC 81 MG tablet Take 1 tablet (81 mg total) by mouth daily.     atorvastatin (LIPITOR) 10 MG tablet Take 1 tablet (10 mg total) by mouth daily. Hold while on Daptomycin therapy - can resume taking 03/30/23 30 tablet 0   blood glucose meter kit and supplies KIT Dispense based on  patient and insurance preference. Use up to four times daily as directed. (FOR ICD-10 E11.65). 1 each 5   Blood Glucose Monitoring Suppl (ACCU-CHEK GUIDE) w/Device KIT Check sugars 2x a day 1 kit 0   busPIRone (BUSPAR) 5 MG tablet Take 5 mg by mouth 2 (two) times daily.     DULoxetine (CYMBALTA) 30 MG capsule Take 30 mg by mouth daily.     FARXIGA 10 MG TABS tablet Take 1 tablet (10 mg total) by mouth daily. 90 tablet 3   glucose blood (ACCU-CHEK GUIDE) test strip Use as instructed to check blood sugar 2X daily 200 each 3   GVOKE HYPOPEN 1-PACK 1 MG/0.2ML SOAJ Inject 1 mg under skin as needed for hypoglycemia 0.2 mL prn   hydrOXYzine (ATARAX) 50 MG tablet Take 50 mg by mouth daily as needed for anxiety, itching, nausea or vomiting.     ibuprofen (ADVIL) 800 MG tablet Take 800 mg by mouth 3 (three) times daily as needed for headache, mild pain or moderate pain.     insulin glargine (LANTUS SOLOSTAR) 100 UNIT/ML Solostar Pen Inject 20 Units into the skin daily. (Patient taking differently: Inject 26 Units into the skin 2 (two) times daily.) 45 mL 3   insulin glargine, 1 Unit Dial, (TOUJEO SOLOSTAR) 300 UNIT/ML Solostar Pen Inject 30 Units into the skin daily. 4.5 mL 3   Insulin Pen Needle 32G X 4 MM MISC Use 2x a day 200 each 3   metoprolol succinate (TOPROL-XL) 50 MG 24 hr tablet Take 1 tablet (50 mg total) by mouth at bedtime. Take with or immediately following a meal. 90 tablet 3   nitroGLYCERIN (NITROSTAT) 0.4 MG SL tablet Place 1 tablet (0.4 mg total) under the tongue every 5 (five) minutes as needed for  chest pain. 25 tablet 3   Semaglutide, 1 MG/DOSE, (OZEMPIC, 1 MG/DOSE,) 4 MG/3ML SOPN INJECT 1 MG SUBCUTANEOUSLY ONCE A WEEK 3 mL 5   senna-docusate (SENOKOT-S) 8.6-50 MG tablet Take 1 tablet by mouth at bedtime as needed for mild constipation. (Patient not taking: Reported on 06/07/2023)     zolpidem (AMBIEN) 10 MG tablet Take 10 mg by mouth at bedtime.     No current facility-administered  medications on file prior to visit.   Allergies  Allergen Reactions   Amoxicillin Hives    Has patient had a PCN reaction causing immediate rash, facial/tongue/throat swelling, SOB or lightheadedness with hypotension: No Has patient had a PCN reaction causing severe rash involving mucus membranes or skin necrosis: No Has patient had a PCN reaction that required hospitalization: No Has patient had a PCN reaction occurring within the last 10 years: No If all of the above answers are "NO", then may proceed with Cephalosporin use.    Lexapro [Escitalopram] Other (See Comments)   Family History  Problem Relation Age of Onset   Healthy Mother    Healthy Father    Healthy Sister    Healthy Sister    Diabetes Maternal Grandmother    Thyroid disease Maternal Grandmother    Hypertension Maternal Grandmother    Neuropathy Maternal Grandmother    PE: BP 120/80   Pulse 98   Ht 6\' 3"  (1.905 m)   Wt 186 lb (84.4 kg)   SpO2 99%   BMI 23.25 kg/m  Wt Readings from Last 3 Encounters:  06/28/23 186 lb (84.4 kg)  06/07/23 183 lb (83 kg)  03/15/23 189 lb 9.6 oz (86 kg)   Constitutional: Normal weight, in NAD Eyes:no exophthalmos ENT: no thyromegaly, no cervical lymphadenopathy Cardiovascular: Tachycardia, RR, No MRG Respiratory: CTA B Musculoskeletal: no deformities Skin:  + macular rash bilateral shins and feet Neurological: no tremor with outstretched hands  ASSESSMENT: 1. DM2, insulin-dependent, uncontrolled, with complications - DR - Autonomic neuropathy - PN - Microalbuminuria  Component     Latest Ref Rng 07/31/2022  Glucose     65 - 99 mg/dL 67   Glutamic Acid Decarb Ab     <5 IU/mL <5   Islet Cell Ab     Neg:<1:1  Negative   C-Peptide     0.80 - 3.85 ng/mL 0.76 (L)   ZNT8 Antibodies     <15 U/mL <10   Labs confirm type 1 diabetes with low insulin production.  2.  Weight loss  3.  Hashimoto's thyroiditis  PLAN:  1. Patient with longstanding, uncontrolled,  insulin-dependent diabetes, with dramatic weight loss and diabetes improvement between 2022 and 2023, when sugars started to drop to the 40s and HbA1c returned at 5.6%, and he lost 65 pounds.  PCP stopped his Ozempic and we reduced his Lantus dose.  We checked him for insulin deficiency and C-peptide was low while his pancreatic antibodies were not elevated.  Afterwards, sugars started to increase and PCP started Trulicity, which was subsequently changed to Ozempic.  He tolerated this well.  He lost 20 pounds after restarting Ozempic, before last visit, and sugars improved so we reduced the dose of his Lantus. -At today's visit, sugars are mostly at goal, but higher in the morning, when he wakes up around midday or later.  Lantus was not covered since last visit so he is now on Toujeo, with entire dose taken midday, when he wakes up.  He does describe one instance in which his  sugars dropped to 60 if he did not eat, but this was not recently.  Since his sugars are at goal later in the day and are only slightly elevated in the morning, in the setting of an excellent HbA1c, for now, I advised him to continue the same dose of Toujeo. - I suggested to:  Patient Instructions  Please continue: - Farxiga 10 mg daily - Ozempic 1 mg weekly - Toujeo 30 units daily  Please return in 4 months with your sugar log.   - we checked his HbA1c: 6.2% (slightly higher) - advised to check sugars at different times of the day - 2-3x a day, rotating check times - advised for yearly eye exams >> he is UTD - return to clinic in 4 months  2.  Weight loss -He lost a significant amount of weight: 65 pounds before the last 2 visits (10 pounds before last visit) after starting Ozempic.  He was checked for adrenal insufficiency by PCP and he had a normal a.m. cortisol and ACTH.  He had a history slightly low TSH levels, but the last TSH was normal in 10/2022 -He had to come off Ozempic, but he gained 40 pounds subsequently so he  was started on Trulicity by PCP before last visit.  This was then changed to Ozempic and he lost 20 pounds for last visit.  He had no nausea or other GI symptoms. -At last visit I advised him to continue the same dose of Ozempic, but do let me know if he is losing excessive amounts of weight again -Since then, he only lost 3 more pounds  3.  Hashimoto's thyroiditis -Please also see problem #2 -His TPO antibodies were slightly positive and TRAb antibodies were not elevated -TFTs were normal at last check in 10/2022 except for a slightly elevated free T3 -We did discuss that the time that he could have had mild Graves' disease versus thyrotoxic phase of Hashimoto's thyroiditis -Will recheck his TFTs at next visit  Carlus Pavlov, MD PhD [facility] Endocrinology

## 2023-06-28 NOTE — Patient Instructions (Addendum)
Please continue: - Farxiga 10 mg daily - Ozempic 1 mg weekly - Toujeo 30 units daily  Please return in 4 months with your sugar log.

## 2023-07-05 ENCOUNTER — Ambulatory Visit: Payer: BC Managed Care – PPO | Admitting: Internal Medicine

## 2023-07-17 ENCOUNTER — Other Ambulatory Visit (HOSPITAL_BASED_OUTPATIENT_CLINIC_OR_DEPARTMENT_OTHER): Payer: Self-pay

## 2023-08-09 ENCOUNTER — Ambulatory Visit: Payer: BC Managed Care – PPO | Admitting: Podiatry

## 2023-09-01 ENCOUNTER — Other Ambulatory Visit: Payer: Self-pay | Admitting: Internal Medicine

## 2023-09-18 DIAGNOSIS — F419 Anxiety disorder, unspecified: Secondary | ICD-10-CM | POA: Diagnosis not present

## 2023-09-18 DIAGNOSIS — G4700 Insomnia, unspecified: Secondary | ICD-10-CM | POA: Diagnosis not present

## 2023-09-25 DIAGNOSIS — H5213 Myopia, bilateral: Secondary | ICD-10-CM | POA: Diagnosis not present

## 2023-10-09 ENCOUNTER — Other Ambulatory Visit: Payer: Self-pay

## 2023-10-09 DIAGNOSIS — Z794 Long term (current) use of insulin: Secondary | ICD-10-CM

## 2023-10-20 ENCOUNTER — Other Ambulatory Visit: Payer: Self-pay | Admitting: Internal Medicine

## 2023-11-01 ENCOUNTER — Ambulatory Visit: Payer: BC Managed Care – PPO | Admitting: Internal Medicine

## 2023-11-04 ENCOUNTER — Encounter: Payer: Self-pay | Admitting: Internal Medicine

## 2023-11-04 ENCOUNTER — Ambulatory Visit (INDEPENDENT_AMBULATORY_CARE_PROVIDER_SITE_OTHER): Payer: BC Managed Care – PPO | Admitting: Internal Medicine

## 2023-11-04 VITALS — BP 116/60 | HR 95 | Ht 75.0 in | Wt 184.8 lb

## 2023-11-04 DIAGNOSIS — E1165 Type 2 diabetes mellitus with hyperglycemia: Secondary | ICD-10-CM

## 2023-11-04 DIAGNOSIS — R634 Abnormal weight loss: Secondary | ICD-10-CM | POA: Diagnosis not present

## 2023-11-04 DIAGNOSIS — E1142 Type 2 diabetes mellitus with diabetic polyneuropathy: Secondary | ICD-10-CM

## 2023-11-04 DIAGNOSIS — Z7985 Long-term (current) use of injectable non-insulin antidiabetic drugs: Secondary | ICD-10-CM | POA: Diagnosis not present

## 2023-11-04 DIAGNOSIS — Z7984 Long term (current) use of oral hypoglycemic drugs: Secondary | ICD-10-CM

## 2023-11-04 DIAGNOSIS — E063 Autoimmune thyroiditis: Secondary | ICD-10-CM | POA: Diagnosis not present

## 2023-11-04 LAB — COMPREHENSIVE METABOLIC PANEL
ALT: 21 U/L (ref 0–53)
AST: 23 U/L (ref 0–37)
Albumin: 4.7 g/dL (ref 3.5–5.2)
Alkaline Phosphatase: 65 U/L (ref 39–117)
BUN: 18 mg/dL (ref 6–23)
CO2: 29 meq/L (ref 19–32)
Calcium: 10.3 mg/dL (ref 8.4–10.5)
Chloride: 100 meq/L (ref 96–112)
Creatinine, Ser: 0.91 mg/dL (ref 0.40–1.50)
GFR: 116.83 mL/min (ref >60.00)
Glucose, Bld: 150 mg/dL — ABNORMAL HIGH (ref 70–99)
Potassium: 4.6 meq/L (ref 3.5–5.1)
Sodium: 137 meq/L (ref 135–145)
Total Bilirubin: 0.8 mg/dL (ref 0.2–1.2)
Total Protein: 8.1 g/dL (ref 6.0–8.3)

## 2023-11-04 LAB — TSH: TSH: 0.61 u[IU]/mL (ref 0.35–5.50)

## 2023-11-04 LAB — POCT GLYCOSYLATED HEMOGLOBIN (HGB A1C): Hemoglobin A1C: 6.6 % — AB (ref 4.0–5.6)

## 2023-11-04 LAB — LIPID PANEL
Cholesterol: 90 mg/dL (ref 0–200)
HDL: 39.7 mg/dL (ref >39.00)
LDL Cholesterol: 29 mg/dL (ref 0–99)
NonHDL: 49.98
Total CHOL/HDL Ratio: 2
Triglycerides: 105 mg/dL (ref 0.0–149.0)
VLDL: 21 mg/dL (ref 0.0–40.0)

## 2023-11-04 LAB — MICROALBUMIN / CREATININE URINE RATIO
Creatinine,U: 140.8 mg/dL
Microalb Creat Ratio: 1.2 mg/g (ref 0.0–30.0)
Microalb, Ur: 1.7 mg/dL (ref 0.0–1.9)

## 2023-11-04 NOTE — Addendum Note (Signed)
Addended by: Pollie Meyer on: 11/04/2023 04:19 PM   Modules accepted: Orders

## 2023-11-04 NOTE — Patient Instructions (Addendum)
Please continue: - Farxiga 10 mg daily - Ozempic 1 mg weekly  Please change:  - Toujeo 50 units daily in the evening  Please return in 4 months with your sugar log.

## 2023-11-04 NOTE — Progress Notes (Signed)
Patient ID: [name], male   DOB: 10-08-1996, 27 y.o.   MRN: [id number]   HPI: [name] is a 27 y.o.-year-old male, initially referred by his PCP, Dr. Janeece Riggers returning for follow-up for DM, dx at 27 years old, insulin-dependent since 27 y/o, uncontrolled, with complications (autonomic neuropathy, PN, microalbuminuria, DR).  She saw Dr. Fransico Him in the past. Last visit 4 months ago. [place] + [employer].  Interim history: No increased urination, nausea, chest pain.  He has blurry vision in left eye.  Reviewed HbA1c: Lab Results  Component Value Date   HGBA1C 6.2 06/28/2023   HGBA1C 6.0 (H) 02/15/2023   HGBA1C 8.4 (A) 11/14/2022   HGBA1C 5.3 07/31/2022   HGBA1C 6.9 (A) 03/27/2022   HGBA1C 10.5 (H) 05/20/2021   HGBA1C 11.4 (A) 10/18/2020   HGBA1C 9.7 (H) 03/17/2019   HGBA1C 8.6 (H) 11/18/2018   HGBA1C 12.5 08/14/2018  10/25/2022: HbA1c 7.4% 01/10/2022: HbA1c 6.5% 08/2020: HbA1c 12.1% 04/2020: HbA1c 11.5%  Previously on: - Metformin ER 500 mg 2x a day, with meals - Victoza 1.2 mg daily in am - started by PCP on 09/28/2020 - no nausea - Lantus 77 units at bedtime >> split 35 units 2x a day on 09/28/2020 He was on Humalog 7-7-9 units before meals  - stopped when started Victoza Metformin IR >> nausea.  I recommended the following regimen: - Farxiga 10 before b'fast  -started 10/2021 -  >> Ozempic 1 mg weekly - Lantus 35  units 2x a day >> .Marland Kitchen.10 units 2x a day >> 24-26 >> 15 units 2x a day >> Toujeo 30 units (but taking it 2x a day!!) He was previously on metformin but developed nausea and stomach pain. He was previously on Humalog but this was stopped 10/2021 due to low blood sugars. Previously on Victoza, but this was stopped when switching to Ozempic.  Pt checks his sugars 1-2x a day: - am:  336 >> 40s, 80-90s >> 130-155, 190 (wakes up late, between 12-4 pm) >> 141, 159 - 2h after b'fast: n/c >> 182 >>. N/c  - before lunch: 120-140 >> 40s-80s >> 145 >> 80-120 >> 85-120  >> see above - 2h after lunch: n/c >> 231-253 >> n/c >> 187 - before dinner: 250 >> 140-160 >> 40s-80s >> n/c >> 110-134, 174 - 2h after dinner: n/c >> 359, 371 >> n/c - bedtime: 200-310 >> n/c >> 181, 196 >> 80-120 >> 60-170, 195 >> 117 - nighttime: n/c >> 127 Lowest sugar was 40s x1 >> 60 >> 90; ?  At which level he has hypoglycemia awareness. Highest sugar was  371 >> 173 >> 195 >> 187  Glucometer: Accu-Chek >> ReliOn   Pt's meals are: - Breakfast: if eats b'fast: cornflakes + milk - Lunch: half a sandwich - Dinner: meat + veggies + starch - home cooked - Snacks: not usually Stopped sweet tea.  No regular sodas.  - no CKD but he has a history of microalbuminuria, last BUN/creatinine:  Lab Results  Component Value Date   BUN 11 02/18/2023   BUN 10 02/17/2023   CREATININE 0.78 02/18/2023   CREATININE 0.77 02/17/2023   ACR: No results found for: "MICRALBCREAT" 10/25/2022: 15 07/07/2022: ACR 13 10/19/2015: ACR 78.8 07/28/2012: ACR 41.8 Not on ACE inhibitor/ARB.  -+ HL; last set of lipids: 10/25/2022: 123/168/60/38 07/07/2021: 106/152/33/50 10/19/2015: 202/110/63/117 No results found for: "CHOL", "HDL", "LDLCALC", "LDLDIRECT", "TRIG", "CHOLHDL" On Lipitor 10.  - last eye exam was in 09/2023. + DR and  macular edema reportedly. No IO injections.  - + numbness and tingling in his feet.  He has peripheral neuropathy for which she sees podiatry and neurology.  Last foot exam was 05/03/2023: Dr. Annamary Rummage He had EMG/NCV: Message from Huston Foley, MD sent at 03/13/2022  5:12 PM EDT ----- Please call patient is him that his recent electrical nerve and muscle test through our office confirms the suspicion of nerve damage, likely from his underlying diabetes.  As discussed, I would recommend ongoing strict diabetes control.  For now, I recommend he follow-up with his endocrinologist (diabetes specialist) and primary care physician closely. At the end of 2023, he had a left foot ulcer  (saw podiatry). He developed acute osteomyelitis of right foot and he had I&D + had resection of distal half of the 4th toe and placement of Stimulan beads in 01/2023.  This helped healing.  Prev. On Reglan. On ASA 81.  Pt has FH of DM in MGM, MGGM, PGF - prediabetes.  He also has a history of HTN, cardiac murmur.  He had tachycardia and palpitations due to autoimmune.  He was started on carvedilol after an ED visit in 2019.  He continued to have shortness of breath and exertional fatigue. Stress test, 2D Echo and heart monitor >> normal in 2021.    He was  found to have slightly low TSH levels: Lab Results  Component Value Date   TSH 1.54 11/14/2022   TSH 0.31 (L) 07/31/2022   TSH 0.317 (L) 02/08/2022   TSH 0.277 (L) 06/07/2021   TSH 0.62 03/17/2019   TSH 1.02 11/18/2018   TSH 0.517 08/26/2018   TSH 0.41 08/14/2018   TSH 0.658 08/10/2018   Lab Results  Component Value Date   TSI <89 07/31/2022      2022-04-11    THYROGLOBULIN ANTIBODIES <1   < or = 1  THYROID PEROXIDASE ANTIBODIES 11   <9  TRAb (TSH Receptor Binding Antibody)   2022-04-11    TRAB <1.00   <=2.00  TSH+FREE T4   2022-04-11    TSH 0.31   0.40-4.50  T4, FREE 1.0   0.8-1.8   Further labs reviewed per  records from PCP: ACTH, PLASMA   2022-07-09    ACTH, PLASMA 19   6-50  CORTISOL, A.M.   2022-07-09    CORTISOL, A.M. 10.3      He is unemployed - disability.  ROS: + see HPI  Past Medical History:  Diagnosis Date   Complication of anesthesia    Hard to wake up when he was younger once.   Diabetes mellitus, type II (HCC)    Heart murmur    Hypertension    Past Surgical History:  Procedure Laterality Date   AMPUTATION Right 05/21/2021   Procedure: AMPUTATION RAY 5th;  Surgeon: Vivi Barrack, DPM;  Location: [facility] OR;  Service: Podiatry;  Laterality: Right;   BONE BIOPSY Right 02/15/2023   Procedure: BONE BIOPSY;  Surgeon: Pilar Plate, DPM;  Location: [facility] OR;  Service: Podiatry;  Laterality:  Right;   INCISION AND DRAINAGE Right 02/15/2023   Procedure: INCISION AND DRAINAGE OF RIGHT FOOT WITH FOURTH TOE HEAD RESECTION AND STIMULAN BEADS;  Surgeon: Pilar Plate, DPM;  Location: [facility] OR;  Service: Podiatry;  Laterality: Right;   PALATE / UVULA BIOPSY / EXCISION     "growth removed"   UPPER GASTROINTESTINAL ENDOSCOPY     Done in [place] - 2013 ish   WOUND DEBRIDEMENT Right 05/24/2021  Procedure: RIGHT FOOT WOUND DEBRIDEMENT AND CLOSURE;  Surgeon: Park Liter, DPM;  Location: [facility] OR;  Service: Podiatry;  Laterality: Right;   Social History   Socioeconomic History   Marital status: Single    Spouse name: Not on file   Number of children: Not on file   Years of education: Not on file   Highest education level: Not on file  Occupational History   Not on file  Tobacco Use   Smoking status: Never    Passive exposure: Yes   Smokeless tobacco: Never  Vaping Use   Vaping status: Never Used  Substance and Sexual Activity   Alcohol use: No   Drug use: No   Sexual activity: Not on file  Other Topics Concern   Not on file  Social History Narrative   Not on file   Social Determinants of Health   Financial Resource Strain: Not on file  Food Insecurity: Not on file  Transportation Needs: Not on file  Physical Activity: Not on file  Stress: Not on file  Social Connections: Not on file  Intimate Partner Violence: Not on file   Current Outpatient Medications on File Prior to Visit  Medication Sig Dispense Refill   Accu-Chek Softclix Lancets lancets USE   TO CHECK GLUCOSE TWICE DAILY 100 each 0   aspirin EC 81 MG tablet Take 1 tablet (81 mg total) by mouth daily.     atorvastatin (LIPITOR) 10 MG tablet Take 1 tablet (10 mg total) by mouth daily. Hold while on Daptomycin therapy - can resume taking 03/30/23 30 tablet 0   blood glucose meter kit and supplies KIT Dispense based on patient and insurance preference. Use up to four times daily as directed. (FOR ICD-10  E11.65). 1 each 5   Blood Glucose Monitoring Suppl (ACCU-CHEK GUIDE) w/Device KIT Check sugars 2x a day 1 kit 0   busPIRone (BUSPAR) 5 MG tablet Take 5 mg by mouth 2 (two) times daily.     DULoxetine (CYMBALTA) 30 MG capsule Take 30 mg by mouth daily.     FARXIGA 10 MG TABS tablet Take 1 tablet (10 mg total) by mouth daily. 90 tablet 3   glucose blood (ACCU-CHEK GUIDE) test strip Use as instructed to check blood sugar 2X daily 200 each 3   GVOKE HYPOPEN 1-PACK 1 MG/0.2ML SOAJ Inject 1 mg under skin as needed for hypoglycemia 0.2 mL prn   hydrOXYzine (ATARAX) 50 MG tablet Take 50 mg by mouth daily as needed for anxiety, itching, nausea or vomiting.     ibuprofen (ADVIL) 800 MG tablet Take 800 mg by mouth 3 (three) times daily as needed for headache, mild pain or moderate pain.     Insulin Pen Needle 32G X 4 MM MISC Use 2x a day 200 each 3   metoprolol succinate (TOPROL-XL) 50 MG 24 hr tablet Take 1 tablet (50 mg total) by mouth at bedtime. Take with or immediately following a meal. 90 tablet 3   nitroGLYCERIN (NITROSTAT) 0.4 MG SL tablet Place 1 tablet (0.4 mg total) under the tongue every 5 (five) minutes as needed for chest pain. 25 tablet 3   Semaglutide, 1 MG/DOSE, (OZEMPIC, 1 MG/DOSE,) 4 MG/3ML SOPN INJECT 1 MG SUBCUTANEOUSLY ONCE A WEEK 9 mL 3   senna-docusate (SENOKOT-S) 8.6-50 MG tablet Take 1 tablet by mouth at bedtime as needed for mild constipation. (Patient not taking: Reported on 06/07/2023)     TOUJEO SOLOSTAR 300 UNIT/ML Solostar Pen INJECT 30 UNITS SUBCUTANEOUSLY  ONCE DAILY 6 mL 0   zolpidem (AMBIEN) 10 MG tablet Take 10 mg by mouth at bedtime.     No current facility-administered medications on file prior to visit.   Allergies  Allergen Reactions   Amoxicillin Hives    Has patient had a PCN reaction causing immediate rash, facial/tongue/throat swelling, SOB or lightheadedness with hypotension: No Has patient had a PCN reaction causing severe rash involving mucus membranes or  skin necrosis: No Has patient had a PCN reaction that required hospitalization: No Has patient had a PCN reaction occurring within the last 10 years: No If all of the above answers are "NO", then may proceed with Cephalosporin use.    Lexapro [Escitalopram] Other (See Comments)   Family History  Problem Relation Age of Onset   Healthy Mother    Healthy Father    Healthy Sister    Healthy Sister    Diabetes Maternal Grandmother    Thyroid disease Maternal Grandmother    Hypertension Maternal Grandmother    Neuropathy Maternal Grandmother    PE: BP 116/60   Pulse 95   Ht 6\' 3"  (1.905 m)   Wt 184 lb 12.8 oz (83.8 kg)   SpO2 96%   BMI 23.10 kg/m  Wt Readings from Last 3 Encounters:  11/04/23 184 lb 12.8 oz (83.8 kg)  06/28/23 186 lb (84.4 kg)  06/07/23 183 lb (83 kg)   Constitutional: Normal weight, in NAD Eyes:no exophthalmos ENT: no thyromegaly, no cervical lymphadenopathy Cardiovascular: Tachycardia, RR, No MRG Respiratory: CTA B Musculoskeletal: no deformities Skin:  + macular rash bilateral shins and feet Neurological: no tremor with outstretched hands  ASSESSMENT: 1. DM, insulin-dependent, uncontrolled, with complications - DR - Autonomic neuropathy - PN - Microalbuminuria  Component     Latest Ref Rng 07/31/2022  Glucose     65 - 99 mg/dL 67   Glutamic Acid Decarb Ab     <5 IU/mL <5   Islet Cell Ab     Neg:<1:1  Negative   C-Peptide     0.80 - 3.85 ng/mL 0.76 (L)   ZNT8 Antibodies     <15 U/mL <10   Labs confirm type 1 diabetes with low insulin production.  2.  Weight loss  3.  Hashimoto's thyroiditis  PLAN:  1. Patient with longstanding, uncontrolled insulin-dependent diabetes, with dramatic weight loss and diabetes improvement between 20 2020 2023, will sugars started to drop in the 40s and HbA1c returned 5.6% in the setting of a weight loss of 65 pounds.  PCP stopped Ozempic and reduce Lantus dose.  We checked him for insulin deficiency and  C-peptide was low, while is in the pancreatic antibodies were not elevated.  Afterwards, sugars started to increase and PCP started Trulicity, which was subsequently changed to Ozempic.  After starting Ozempic back, he lost another 20 pounds but weight stabilized.  At last visit, sugars were mostly at goal, higher in the morning, when he was waking up around midday or later.  Lantus was not covered so he was on Toujeo.  He did describe 1 instance in which the sugars dropped to 60s skipping a meal, but this was an isolated incidence.  We did not change his regimen at that time.  HbA1c was excellent, at 6.2%. -At today's visit, sugars are slightly higher.  They are a little above target in the morning but today during the day they are slightly better.  Upon questioning, he is taking the Toujeo twice a day, rather than once  a day.  We discussed about moving Toujeo all in 1 dose but I advised him to take a slightly lower daily dose, 50 units rather than 60.  I advised him to go up on the dose if needed, if the sugars start increasing especially with the holidays.  Will continue Farxiga and Ozempic at the same doses for now. - I suggested to:  Patient Instructions  Please continue: - Farxiga 10 mg daily - Ozempic 1 mg weekly - Toujeo 30 units 2x daily  Please return in 4 months with your sugar log.   - we checked his HbA1c: 6.6% (higher) - advised to check sugars at different times of the day - 1-2x a day, rotating check times - advised for yearly eye exams >> he is UTD - return to clinic in 4 months  2.  Weight loss -He lost a significant amount of weight: 65 pounds before the last 2 visits (10 pounds before last visit) after starting Ozempic.  He was checked for adrenal insufficiency by PCP and he had a normal a.m. cortisol and ACTH.  He had a history slightly low TSH levels, but latest TFTs were normal 10/2022 -He had to come off Ozempic, but he gained 40 pounds subsequently so he was started on  Trulicity by PCP.  This was then changed to Ozempic and he again lost weight-20 pounds.  No nausea or other GI symptoms. -At last visit I advised him to continue the same dose of Ozempic, but do let me know if he is losing excessive amounts of weight again -Before last visit, he only lost 3 more pounds and lost 2 more since last visit.  3.  Hashimoto's thyroiditis -Please also see problem #2 -His TPO antibodies were slightly positive and TRAb antibodies are not elevated -TFTs were normal at last check in 10/2022 except for slightly elevated free T3 -We discussed that he could have had mild Graves' disease versus thyrotoxic phase of Hashimoto's thyroiditis -Will recheck his TSH now  Component     Latest Ref Rng 11/04/2023  Glucose     70 - 99 mg/dL 161 (H)   BUN     6 - 23 mg/dL 18   Creatinine     0.96 - 1.50 mg/dL 0.45   Sodium     409 - 145 mEq/L 137   Potassium     3.5 - 5.1 mEq/L 4.6   Chloride     96 - 112 mEq/L 100   CO2     19 - 32 mEq/L 29   Calcium     8.4 - 10.5 mg/dL 81.1   Total Protein     6.0 - 8.3 g/dL 8.1   Total Bilirubin     0.2 - 1.2 mg/dL 0.8   AST     0 - 37 U/L 23   ALT     0 - 53 U/L 21   TSH     0.35 - 5.50 uIU/mL 0.61   Albumin     3.5 - 5.2 g/dL 4.7   Alkaline Phosphatase     39 - 117 U/L 65   Cholesterol     0 - 200 mg/dL 90   Triglycerides     0.0 - 149.0 mg/dL 914.7   HDL Cholesterol     >39.00 mg/dL 82.95   VLDL     0.0 - 40.0 mg/dL 62.1   LDL (calc)     0 - 99 mg/dL 29   Total CHOL/HDL Ratio  2   NonHDL 49.98   GFR     >60.00 mL/min 116.83   Microalb, Ur     0.0 - 1.9 mg/dL 1.7   Creatinine,U     mg/dL 409.8   MICROALB/CREAT RATIO     0.0 - 30.0 mg/g 1.2     All labs are at goal.  Carlus Pavlov, MD PhD [facility] Endocrinology

## 2023-12-03 ENCOUNTER — Other Ambulatory Visit: Payer: Self-pay | Admitting: Internal Medicine

## 2024-01-19 ENCOUNTER — Other Ambulatory Visit: Payer: Self-pay | Admitting: Internal Medicine

## 2024-03-12 ENCOUNTER — Ambulatory Visit: Payer: BC Managed Care – PPO | Admitting: Internal Medicine

## 2024-03-12 NOTE — Progress Notes (Deleted)
 Patient ID: [name], male   DOB: 11-16-1996, 28 y.o.   MRN: [id number]   HPI: [name] is a 28 y.o.-year-old male, initially referred by his PCP, Dr. Janeece Riggers returning for follow-up for DM, dx at 28 years old, insulin-dependent since 28 y/o, uncontrolled, with complications (autonomic neuropathy, PN, microalbuminuria, DR).  She saw Dr. Fransico Him in the past. Last visit with me.  4 months ago. [place] + [employer].  Interim history: No increased urination, nausea, chest pain.  [name] has blurry vision in left eye.  Reviewed HbA1c: Lab Results  Component Value Date   HGBA1C 6.6 (A) 11/04/2023   HGBA1C 6.2 06/28/2023   HGBA1C 6.0 (H) 02/15/2023   HGBA1C 8.4 (A) 11/14/2022   HGBA1C 5.3 07/31/2022   HGBA1C 6.9 (A) 03/27/2022   HGBA1C 10.5 (H) 05/20/2021   HGBA1C 11.4 (A) 10/18/2020   HGBA1C 9.7 (H) 03/17/2019   HGBA1C 8.6 (H) 11/18/2018  10/25/2022: HbA1c 7.4% 01/10/2022: HbA1c 6.5% 08/2020: HbA1c 12.1% 04/2020: HbA1c 11.5%  Previously on: - Metformin ER 500 mg 2x a day, with meals - Victoza 1.2 mg daily in am - started by PCP on 09/28/2020 - no nausea - Lantus 77 units at bedtime >> split 35 units 2x a day on 09/28/2020 [name] was on Humalog 7-7-9 units before meals  - stopped when started Victoza Metformin IR >> nausea.  [name] is now on: - Farxiga 10 before b'fast  -started 10/2021 -  >> Ozempic 1 mg weekly - Lantus 35  units 2x a day >> .Marland Kitchen.10 units 2x a day >> 24-26 >> 15 units 2x a day >> Toujeo 30 units (taking it 2x a day) [name] was previously on metformin but developed nausea and stomach pain. [name] was previously on Humalog but this was stopped 10/2021 due to low blood sugars. Previously on Victoza, but this was stopped when switching to Ozempic.  Pt checks his sugars 1-2x a day: - am: 40s, 80-90s >> 130-155, 190 (wakes up l12-4 pm) >> 141, 159 - 2h after b'fast: n/c >> 182 >> N/c  - before lunch: 40s-80s >> 145 >> 80-120 >> 85-120 >> see above - 2h after lunch: n/c >> 231-253  >> n/c >> 187 - before dinner: 140-160 >> 40s-80s >> n/c >> 110-134, 174 - 2h after dinner: n/c >> 359, 371 >> n/c - bedtime:  181, 196 >> 80-120 >> 60-170, 195 >> 117 - nighttime: n/c >> 127 Lowest sugar was 40s x1 >> 60 >> 90; ?  At which level [name] has hypoglycemia awareness. Highest sugar was  371 >> 173 >> 195 >> 187  Glucometer: Accu-Chek >> ReliOn   Pt's meals are: - Breakfast: if eats b'fast: cornflakes + milk - Lunch: half a sandwich - Dinner: meat + veggies + starch - home cooked - Snacks: not usually Stopped sweet tea.  No regular sodas.  - no CKD but [name] has a history of microalbuminuria, last BUN/creatinine:  Lab Results  Component Value Date   BUN 18 11/04/2023   BUN 11 02/18/2023   CREATININE 0.91 11/04/2023   CREATININE 0.78 02/18/2023   ACR: Lab Results  Component Value Date   MICRALBCREAT 1.2 11/04/2023  10/25/2022: 15 07/07/2022: ACR 13 10/19/2015: ACR 78.8 07/28/2012: ACR 41.8 Not on ACE inhibitor/ARB.  -+ HL; last set of lipids: Lab Results  Component Value Date   CHOL 90 11/04/2023   HDL 39.70 11/04/2023   LDLCALC 29 11/04/2023   TRIG 105.0 11/04/2023   CHOLHDL 2 11/04/2023  On  Lipitor 10.  - last eye exam was in 09/2023. + DR and macular edema reportedly. No IO injections.  - + numbness and tingling in his feet.  [name] has peripheral neuropathy for which she sees podiatry and neurology.  Last foot exam was 05/03/2023: Dr. Annamary Rummage [name] had EMG/NCV: Message from Huston Foley, MD sent at 03/13/2022  5:12 PM EDT ----- Please call patient is him that his recent electrical nerve and muscle test through our office confirms the suspicion of nerve damage, likely from his underlying diabetes.  As discussed, I would recommend ongoing strict diabetes control.  For now, I recommend [name] follow-up with his endocrinologist (diabetes specialist) and primary care physician closely. At the end of 2023, [name] had a left foot ulcer (saw podiatry). 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  This helped healing.  Prev. On Reglan. On ASA 81.  Pt has FH of DM in MGM, MGGM, PGF - prediabetes.  [name] also has a history of HTN, cardiac murmur.  [name] had tachycardia and palpitations due to autoimmune.  [name] was started on carvedilol after an ED visit in 2019.  [name] continued to have shortness of breath and exertional fatigue. Stress test, 2D Echo and heart monitor >> normal in 2021.    [name] was  found to have slightly low TSH levels: Lab Results  Component Value Date   TSH 0.61 11/04/2023   TSH 1.54 11/14/2022   TSH 0.31 (L) 07/31/2022   TSH 0.317 (L) 02/08/2022   TSH 0.277 (L) 06/07/2021   TSH 0.62 03/17/2019   TSH 1.02 11/18/2018   TSH 0.517 08/26/2018   TSH 0.41 08/14/2018   TSH 0.658 08/10/2018   Lab Results  Component Value Date   TSI <89 07/31/2022      2022-04-11    THYROGLOBULIN ANTIBODIES <1   < or = 1  THYROID PEROXIDASE ANTIBODIES 11   <9  TRAb (TSH Receptor Binding Antibody)   2022-04-11    TRAB <1.00   <=2.00  TSH+FREE T4   2022-04-11    TSH 0.31   0.40-4.50  T4, FREE 1.0   0.8-1.8   Further labs reviewed per  records from PCP: ACTH, PLASMA   2022-07-09    ACTH, PLASMA 19   6-50  CORTISOL, A.M.   2022-07-09    CORTISOL, A.M. 10.3      [name] is unemployed - disability.  ROS: + see HPI  Past Medical History:  Diagnosis Date   Complication of anesthesia    Hard to wake up when [name] was younger once.   Diabetes mellitus, type II (HCC)    Heart murmur    Hypertension    Past Surgical History:  Procedure Laterality Date   AMPUTATION Right 05/21/2021   Procedure: AMPUTATION RAY 5th;  Surgeon: Vivi Barrack, DPM;  Location: [facility] OR;  Service: Podiatry;  Laterality: Right;   BONE BIOPSY Right 02/15/2023   Procedure: BONE BIOPSY;  Surgeon: Pilar Plate, DPM;  Location: [facility] OR;  Service: Podiatry;  Laterality: Right;    INCISION AND DRAINAGE Right 02/15/2023   Procedure: INCISION AND DRAINAGE OF RIGHT FOOT WITH FOURTH TOE HEAD RESECTION AND STIMULAN BEADS;  Surgeon: Pilar Plate, DPM;  Location: [facility] OR;  Service: Podiatry;  Laterality: Right;   PALATE / UVULA BIOPSY / EXCISION     "growth removed"   UPPER GASTROINTESTINAL ENDOSCOPY  Done in [place] - 2013 ish   WOUND DEBRIDEMENT Right 05/24/2021   Procedure: RIGHT FOOT WOUND DEBRIDEMENT AND CLOSURE;  Surgeon: Park Liter, DPM;  Location: [facility] OR;  Service: Podiatry;  Laterality: Right;   Social History   Socioeconomic History   Marital status: Single    Spouse name: Not on file   Number of children: Not on file   Years of education: Not on file   Highest education level: Not on file  Occupational History   Not on file  Tobacco Use   Smoking status: Never    Passive exposure: Yes   Smokeless tobacco: Never  Vaping Use   Vaping status: Never Used  Substance and Sexual Activity   Alcohol use: No   Drug use: No   Sexual activity: Not on file  Other Topics Concern   Not on file  Social History Narrative   Not on file   Social Drivers of Health   Financial Resource Strain: Not on file  Food Insecurity: Not on file  Transportation Needs: Not on file  Physical Activity: Not on file  Stress: Not on file  Social Connections: Not on file  Intimate Partner Violence: Not on file   Current Outpatient Medications on File Prior to Visit  Medication Sig Dispense Refill   Accu-Chek Softclix Lancets lancets USE   TO CHECK GLUCOSE TWICE DAILY 100 each 0   aspirin EC 81 MG tablet Take 1 tablet (81 mg total) by mouth daily.     atorvastatin (LIPITOR) 10 MG tablet Take 1 tablet (10 mg total) by mouth daily. Hold while on Daptomycin therapy - can resume taking 03/30/23 30 tablet 0   blood glucose meter kit and supplies KIT Dispense based on patient and insurance preference. Use up to four times daily as directed. (FOR ICD-10 E11.65). 1 each 5    Blood Glucose Monitoring Suppl (ACCU-CHEK GUIDE) w/Device KIT Check sugars 2x a day 1 kit 0   busPIRone (BUSPAR) 5 MG tablet Take 5 mg by mouth 2 (two) times daily.     DULoxetine (CYMBALTA) 30 MG capsule Take 30 mg by mouth daily.     FARXIGA 10 MG TABS tablet Take 1 tablet (10 mg total) by mouth daily. 90 tablet 3   glucose blood (ACCU-CHEK GUIDE) test strip Use as instructed to check blood sugar 2X daily 200 each 3   GVOKE HYPOPEN 1-PACK 1 MG/0.2ML SOAJ Inject 1 mg under skin as needed for hypoglycemia 0.2 mL prn   hydrOXYzine (ATARAX) 50 MG tablet Take 50 mg by mouth daily as needed for anxiety, itching, nausea or vomiting.     ibuprofen (ADVIL) 800 MG tablet Take 800 mg by mouth 3 (three) times daily as needed for headache, mild pain or moderate pain.     insulin glargine, 1 Unit Dial, (TOUJEO SOLOSTAR) 300 UNIT/ML Solostar Pen INJECT 30 UNITS SUBCUTANEOUSLY ONCE DAILY 6 mL 1   Insulin Pen Needle 32G X 4 MM MISC Use 2x a day 200 each 3   metoprolol succinate (TOPROL-XL) 50 MG 24 hr tablet Take 1 tablet (50 mg total) by mouth at bedtime. Take with or immediately following a meal. 90 tablet 3   nitroGLYCERIN (NITROSTAT) 0.4 MG SL tablet Place 1 tablet (0.4 mg total) under the tongue every 5 (five) minutes as needed for chest pain. 25 tablet 3   Semaglutide, 1 MG/DOSE, (OZEMPIC, 1 MG/DOSE,) 4 MG/3ML SOPN INJECT 1 MG SUBCUTANEOUSLY ONCE A WEEK 9 mL 3  senna-docusate (SENOKOT-S) 8.6-50 MG tablet Take 1 tablet by mouth at bedtime as needed for mild constipation.     zolpidem (AMBIEN) 10 MG tablet Take 10 mg by mouth at bedtime.     No current facility-administered medications on file prior to visit.   Allergies  Allergen Reactions   Amoxicillin Hives    Has patient had a PCN reaction causing immediate rash, facial/tongue/throat swelling, SOB or lightheadedness with hypotension: No Has patient had a PCN reaction causing severe rash involving mucus membranes or skin necrosis: No Has patient  had a PCN reaction that required hospitalization: No Has patient had a PCN reaction occurring within the last 10 years: No If all of the above answers are "NO", then may proceed with Cephalosporin use.    Lexapro [Escitalopram] Other (See Comments)   Family History  Problem Relation Age of Onset   Healthy Mother    Healthy Father    Healthy Sister    Healthy Sister    Diabetes Maternal Grandmother    Thyroid disease Maternal Grandmother    Hypertension Maternal Grandmother    Neuropathy Maternal Grandmother    PE: There were no vitals taken for this visit. Wt Readings from Last 3 Encounters:  11/04/23 184 lb 12.8 oz (83.8 kg)  06/28/23 186 lb (84.4 kg)  06/07/23 183 lb (83 kg)   Constitutional: Normal weight, in NAD Eyes:no exophthalmos ENT: no thyromegaly, no cervical lymphadenopathy Cardiovascular: Tachycardia, RR, No MRG Respiratory: CTA B Musculoskeletal: no deformities Skin:  + macular rash bilateral shins and feet Neurological: no tremor with outstretched hands  ASSESSMENT: 1. DM, insulin-dependent, uncontrolled, with complications - DR - Autonomic neuropathy - PN - Microalbuminuria  Component     Latest Ref Rng 07/31/2022  Glucose     65 - 99 mg/dL 67   Glutamic Acid Decarb Ab     <5 IU/mL <5   Islet Cell Ab     Neg:<1:1  Negative   C-Peptide     0.80 - 3.85 ng/mL 0.76 (L)   ZNT8 Antibodies     <15 U/mL <10   Labs confirm type 1 diabetes with low insulin production.  2.  Weight loss  3.  Hashimoto's thyroiditis  PLAN:  1. Patient   with longstanding, uncontrolled insulin-dependent diabetes, with dramatic weight loss and diabetes improvement between 20 2020 2023, will sugars started to drop in the 40s and HbA1c returned 5.6% in the setting of a weight loss of 65 pounds.  PCP stopped Ozempic and reduce Lantus dose.  We checked him for insulin deficiency and C-peptide was low, while is in the pancreatic antibodies were not elevated.  Afterwards, sugars  started to increase and PCP started Trulicity, which was subsequently changed to Ozempic.  After starting Ozempic back, [name] lost another 20 pounds but weight stabilized.  At last visit, sugars were mostly at goal, higher in the morning, when [name] was waking up around midday or later.  Lantus was not covered so [name] was on Toujeo.  [name] did describe 1 instance in which the sugars dropped to 60s skipping a meal, but this was an isolated incidence.  We did not change his regimen at that time.  HbA1c was excellent, at 6.2%. -At today's visit, sugars are slightly higher.  They are a little above target in the morning but today during the day they are slightly better.  Upon questioning, [name] is taking the Toujeo twice a day, rather than once a day.  We discussed about moving Toujeo  all in 1 dose but I advised him to take a slightly lower daily dose, 50 units rather than 60.  I advised him to go up on the dose if needed, if the sugars start increasing especially with the holidays.  Will continue Farxiga and Ozempic at the same doses for now. - I suggested to:  Patient Instructions  Please continue: - Farxiga 10 mg daily - Ozempic 1 mg weekly - Toujeo 30 units 2x daily  Please return in 4 months with your sugar log.   - we checked his HbA1c: 7%  - advised to check sugars at different times of the day - 2x a day, rotating check times - advised for yearly eye exams >> [name] is UTD - return to clinic in 4 months  2.  Weight loss -[name] lost a significant amount of weight: 65 pounds before the last 2 visits (10 pounds before last visit) after starting Ozempic.  [name] was checked for adrenal insufficiency by PCP and [name] had a normal a.m. cortisol and ACTH.  [name] had a history slightly low TSH levels, but latest TFTs were normal 10/2022 -[name] had to come off Ozempic, but [name] gained 40 pounds subsequently so [name] was started on Trulicity by PCP.  This was then changed to Ozempic and [name] again lost weight-20 pounds.  No nausea or other GI  symptoms. -We will need to decrease the dose of Ozempic and even stop if [name] starts to lose a significant amount of weight again -[name] lost 5 pounds before the last 2 visits combined.  3.  Hashimoto's thyroiditis -His TPO antibodies were slightly positive and TRAb antibodies were not elevated -[name] had a slightly elevated free T3 but otherwise normal TFTs in 10/2022 -We discussed at that time that [name] could have had mild Graves' disease versus thyrotoxic phase of Hashimoto's thyroiditis -At last visit, his TFTs were all normal -At today's visit [name] has no thyrotoxic signs or symptoms -Will continue to keep an eye on his PFTs Carlus Pavlov, MD PhD [facility]

## 2024-04-19 ENCOUNTER — Other Ambulatory Visit: Payer: Self-pay | Admitting: Internal Medicine

## 2024-05-15 ENCOUNTER — Other Ambulatory Visit: Payer: Self-pay | Admitting: Internal Medicine

## 2024-05-19 ENCOUNTER — Other Ambulatory Visit: Payer: Self-pay | Admitting: Internal Medicine

## 2024-05-19 DIAGNOSIS — E1142 Type 2 diabetes mellitus with diabetic polyneuropathy: Secondary | ICD-10-CM

## 2024-05-22 ENCOUNTER — Ambulatory Visit: Admitting: Urology

## 2024-05-25 ENCOUNTER — Encounter: Payer: Self-pay | Admitting: Internal Medicine

## 2024-05-26 ENCOUNTER — Telehealth: Payer: Self-pay

## 2024-05-26 ENCOUNTER — Other Ambulatory Visit (HOSPITAL_COMMUNITY): Payer: Self-pay

## 2024-05-26 NOTE — Telephone Encounter (Signed)
 Pharmacy Patient Advocate Encounter   Received notification from Pt Calls Messages that prior authorization for Farixiga 10mg  is required/requested.   Insurance verification completed.   The patient is insured through [facility] .   Per test claim: PA required; PA submitted to above mentioned insurance via CoverMyMeds Key/confirmation #/EOC [id number] Status is pending

## 2024-05-26 NOTE — Telephone Encounter (Signed)
Pt needs PA for [place]

## 2024-05-27 ENCOUNTER — Other Ambulatory Visit: Payer: Self-pay | Admitting: Student

## 2024-06-01 NOTE — Telephone Encounter (Signed)
 Pharmacy Patient Advocate Encounter  Received notification from [facility] that Prior Authorization for Farxiga  10mg  has been APPROVED from 05/26/24 to 05/26/25   PA #/Case ID/Reference #: [id number]

## 2024-06-09 ENCOUNTER — Other Ambulatory Visit: Payer: Self-pay

## 2024-06-09 DIAGNOSIS — E1165 Type 2 diabetes mellitus with hyperglycemia: Secondary | ICD-10-CM

## 2024-06-09 MED ORDER — GLUCOSE BLOOD VI STRP: ORAL_STRIP | 0 refills | Status: AC

## 2024-06-09 NOTE — Telephone Encounter (Signed)
 Requested Prescriptions   Pending Prescriptions Disp Refills   glucose blood test strip 50 each 0    Sig: USE 1 TEST STRIP TO CHECK BLOOD GLUCOSE TWICE DAILY

## 2024-06-29 ENCOUNTER — Encounter: Payer: Self-pay | Admitting: Podiatry

## 2024-06-29 ENCOUNTER — Ambulatory Visit: Admitting: Podiatry

## 2024-06-29 DIAGNOSIS — E1142 Type 2 diabetes mellitus with diabetic polyneuropathy: Secondary | ICD-10-CM

## 2024-06-29 DIAGNOSIS — L84 Corns and callosities: Secondary | ICD-10-CM

## 2024-06-29 DIAGNOSIS — E119 Type 2 diabetes mellitus without complications: Secondary | ICD-10-CM

## 2024-06-29 NOTE — Progress Notes (Signed)
  Subjective:  Patient ID: [name], male    DOB: March 05, 1996,   MRN: [id number]  Chief Complaint  Patient presents with   Diabetes    F/U Pre-ulcerative calluses right foot and request diabetic shoes. 0 pain. IDDM A1C 6.6.    28 y.o. male presents for diabetic foot check. He has been trying to treat calluses himself and figures he needed to come back and be seen.  History of Relates burning and tingling in their feet. Patient is diabetic and last A1c was  Lab Results  Component Value Date   HGBA1C 6.6 (A) 11/04/2023   .   PCP:  Lucas Mariano SQUIBB, DO    . Denies any other pedal complaints. Denies n/v/f/c.   Past Medical History:  Diagnosis Date   Complication of anesthesia    Hard to wake up when he was younger once.   Diabetes mellitus, type II (HCC)    Heart murmur    Hypertension     Objective:  Physical Exam: Vascular: DP/PT pulses 2/4 bilateral. CFT <3 seconds. Absent hair growth on digits. Edema noted to bilateral lower extremities. Xerosis noted bilaterally.  Skin. No lacerations or abrasions bilateral feet. Nails normal in appearance. Hyperkeratotic lesion noted to residual right lateral foot as well as left plantar fifth metatarsal head.  Musculoskeletal: MMT 5/5 bilateral lower extremities in DF, PF, Inversion and Eversion. Deceased ROM in DF of ankle joint. 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  Neurological: Sensation intact to light touch. Protective sensation diminished bilateral.    Assessment:   1. Pre-ulcerative calluses   2. Type 2 diabetes mellitus with peripheral neuropathy (HCC)   3. Encounter for diabetic foot exam (HCC)      Plan:  Patient was evaluated and treated and all questions answered. -Discussed and educated patient on diabetic foot care, especially with  regards to the vascular, neurological and musculoskeletal systems.  -Stressed the importance of good glycemic control and the detriment of not  controlling glucose levels  in relation to the foot. -Discussed supportive shoes at all times and checking feet regularly.  -Mechanically debrided hyperkeratotic lesions bilateral x 2 with chisel without incident.  -DM shoe prescripton to [place] provided.  -Answered all patient questions -Patient to return  in 3 months for at risk foot care -Patient advised to call the office if any problems or questions arise in the meantime.   Asberry Failing, DPM

## 2024-07-02 LAB — LAB REPORT - SCANNED: A1c: 5.7

## 2024-07-07 ENCOUNTER — Encounter: Payer: Self-pay | Admitting: Internal Medicine

## 2024-07-07 ENCOUNTER — Ambulatory Visit: Admitting: Internal Medicine

## 2024-07-07 VITALS — BP 120/68 | HR 89 | Ht 75.0 in | Wt 192.2 lb

## 2024-07-07 DIAGNOSIS — E1165 Type 2 diabetes mellitus with hyperglycemia: Secondary | ICD-10-CM

## 2024-07-07 DIAGNOSIS — Z7985 Long-term (current) use of injectable non-insulin antidiabetic drugs: Secondary | ICD-10-CM

## 2024-07-07 DIAGNOSIS — R7989 Other specified abnormal findings of blood chemistry: Secondary | ICD-10-CM

## 2024-07-07 DIAGNOSIS — E1142 Type 2 diabetes mellitus with diabetic polyneuropathy: Secondary | ICD-10-CM

## 2024-07-07 DIAGNOSIS — E063 Autoimmune thyroiditis: Secondary | ICD-10-CM | POA: Diagnosis not present

## 2024-07-07 DIAGNOSIS — R634 Abnormal weight loss: Secondary | ICD-10-CM | POA: Diagnosis not present

## 2024-07-07 DIAGNOSIS — Z794 Long term (current) use of insulin: Secondary | ICD-10-CM

## 2024-07-07 MED ORDER — TOUJEO SOLOSTAR 300 UNIT/ML ~~LOC~~ SOPN: 50.0000 [IU] | PEN_INJECTOR | Freq: Every day | SUBCUTANEOUS | 3 refills | Status: DC

## 2024-07-07 MED ORDER — OZEMPIC (1 MG/DOSE) 4 MG/3ML ~~LOC~~ SOPN: PEN_INJECTOR | SUBCUTANEOUS | 3 refills | Status: AC

## 2024-07-07 MED ORDER — FARXIGA 10 MG PO TABS: 10.0000 mg | ORAL_TABLET | Freq: Every day | ORAL | 3 refills | Status: AC

## 2024-07-07 NOTE — Patient Instructions (Addendum)
 Please move:  - Farxiga  10 mg when you wake up to go to work  Continue: - Ozempic  1 mg weekly  Reduce: - Toujeo  50 units when you wake up to go to work  Please stop at the lab.  Please return in 6 months with your sugar log.

## 2024-07-07 NOTE — Progress Notes (Signed)
 Patient ID: [name], male   DOB: 11-23-1996, 28 y.o.   MRN: [id number]   HPI: [name] is a 28 y.o.-year-old male, initially referred by his PCP, Dr. Janace returning for follow-up for DM, dx at 28 years old, insulin -dependent since 28 y/o, uncontrolled, with complications (autonomic neuropathy, PN, microalbuminuria, DR).  She saw Dr. Lenis in the past. Last visit 8 months ago. [employer] + [employer].  Interim history: No increased urination, nausea, chest pain.   He started 3rd shift at [employer] 1.5 mo ago. Prev. not working.  He is now more active.  Reviewed HbA1c: 07/02/2024: HbA1c 5.6% 04/02/2024: HbA1c 7.2% Lab Results  Component Value Date   HGBA1C 6.6 (A) 11/04/2023   HGBA1C 6.2 06/28/2023   HGBA1C 6.0 (H) 02/15/2023   HGBA1C 8.4 (A) 11/14/2022   HGBA1C 5.3 07/31/2022   HGBA1C 6.9 (A) 03/27/2022   HGBA1C 10.5 (H) 05/20/2021   HGBA1C 11.4 (A) 10/18/2020   HGBA1C 9.7 (H) 03/17/2019   HGBA1C 8.6 (H) 11/18/2018  10/25/2022: HbA1c 7.4% 01/10/2022: HbA1c 6.5% 08/2020: HbA1c 12.1% 04/2020: HbA1c 11.5%  Previously on: - Metformin ER 500 mg 2x a day, with meals - Victoza 1.2 mg daily in am - started by PCP on 09/28/2020 - no nausea - Lantus  77 units at bedtime >> split 35 units 2x a day on 09/28/2020 He was on Humalog  7-7-9 units before meals  - stopped when started Victoza Metformin IR >> nausea.  I recommended the following regimen: - Farxiga  10 before b'fast  -started 10/2021 - Ozempic  1 mg weekly - Lantus  35  units 2x a day >> .SABRA.10 units 2x a day >> 24-26 >> 15 units 2x a day >> Toujeo  30 units (but taking it 2x a day)  He was previously on metformin but developed nausea and stomach pain. He was previously on Humalog  but this was stopped 10/2021 due to low blood sugars. Previously on Victoza, but this was stopped when switching to Ozempic .  Pt checks his sugars 1-2x a day: - am:  40s, 80-90s >> 130-155, 190 (12-4 pm) >> 141, 159 >> (evening): 130, 238 - 2h  after b'fast: n/c >> 182 >> n/c  - before lunch: 145 >> 80-120 >> 85-120 >> 67, 95-140 - 2h after lunch: n/c >> 231-253 >> n/c >> 187 >> n/c - before dinner: 40s-80s >> n/c >> 110-134, 174 >> n/c - 2h after dinner: n/c >> 359, 371 >> n/c - bedtime: 80-120 >> 60-170, 195 >> 117 (am): 72 - nighttime: n/c >> 127 >> 83 Lowest sugar was 40s x1 >> 60 >> 90 >> 67; ?  At which level he has hypoglycemia awareness. Highest sugar was  371 >> 173 >> 195 >> 187 >> 238 (missed Ozempic )  Glucometer: Accu-Chek >> ReliOn   Pt's meals are: - Breakfast: if eats b'fast: cornflakes + milk - Lunch: half a sandwich - Dinner: meat + veggies + starch - home cooked - Snacks: not usually Stopped sweet tea.  No regular sodas.  Living with  - no CKD but he has a history of microalbuminuria, last BUN/creatinine:  04/02/2024: 12/0.74, GFR 128, glucose 87 Lab Results  Component Value Date   BUN 18 11/04/2023   BUN 11 02/18/2023   CREATININE 0.91 11/04/2023   CREATININE 0.78 02/18/2023   ACR: No results found for: MICRALBCREAT 04/02/2024: ACR 7 10/25/2022: 15 07/07/2022: ACR 13 10/19/2015: ACR 78.8 07/28/2012: ACR 41.8 Not on ACE inhibitor/ARB.  -+ HL; last set of lipids: 04/02/2024: 90/39/49/34 10/25/2022:  123/168/60/38 07/07/2021: 106/152/33/50 10/19/2015: 202/110/63/117 Lab Results  Component Value Date   CHOL 90 11/04/2023   HDL 39.70 11/04/2023   LDLCALC 29 11/04/2023   TRIG 105.0 11/04/2023   CHOLHDL 2 11/04/2023  On Lipitor 10.  - last eye exam was in 09/2023. + DR and macular edema reportedly. No IO injections.  - + numbness and tingling in his feet.  He has peripheral neuropathy for which she sees podiatry and neurology.  Last foot exam was 06/29/2024: Dr. Sikora. He had EMG/NCV: Message from True Mar, MD sent at 03/13/2022  5:12 PM EDT ----- Please call patient is him that his recent electrical nerve and muscle test through our office confirms the suspicion of nerve damage, likely from  his underlying diabetes.  As discussed, I would recommend ongoing strict diabetes control.  For now, I recommend he follow-up with his endocrinologist (diabetes specialist) and primary care physician closely. At the end of 2023, he had a left foot ulcer (saw podiatry). He developed acute osteomyelitis of right foot and he had I&D + had resection of distal half of the 4th toe and placement of Stimulan beads in 01/2023.  This helped healing.  Prev. On Reglan . On ASA 81.  Pt has FH of DM in MGM, MGGM, PGF - prediabetes.  He also has a history of HTN, cardiac murmur.  He had tachycardia and palpitations due to autoimmune.  He was started on carvedilol  after an ED visit in 2019.  He continued to have shortness of breath and exertional fatigue. Stress test, 2D Echo and heart monitor >> normal in 2021.    He was  found to have slightly low TSH levels: 04/02/2024: TSH 0.12, free T4 1.2 (0.8-1.8) Lab Results  Component Value Date   TSH 0.61 11/04/2023   TSH 1.54 11/14/2022   TSH 0.31 (L) 07/31/2022   TSH 0.317 (L) 02/08/2022   TSH 0.277 (L) 06/07/2021   TSH 0.62 03/17/2019   TSH 1.02 11/18/2018   TSH 0.517 08/26/2018   TSH 0.41 08/14/2018   TSH 0.658 08/10/2018   Lab Results  Component Value Date   TSI <89 07/31/2022      2022-04-11    THYROGLOBULIN ANTIBODIES <1   < or = 1  THYROID  PEROXIDASE ANTIBODIES 11   <9  TRAb (TSH Receptor Binding Antibody)   2022-04-11    TRAB <1.00   <=2.00  TSH+FREE T4   2022-04-11    TSH 0.31   0.40-4.50  T4, FREE 1.0   0.8-1.8   Further labs reviewed per  records from PCP: ACTH , PLASMA   2022-07-09    ACTH , PLASMA 19   6-50  CORTISOL, A.M.   2022-07-09    CORTISOL, A.M. 10.3      He is unemployed - disability.  ROS: + see HPI  Past Medical History:  Diagnosis Date   Complication of anesthesia    Hard to wake up when he was younger once.   Diabetes mellitus, type II (HCC)    Heart murmur    Hypertension    Past Surgical History:  Procedure  Laterality Date   AMPUTATION Right 05/21/2021   Procedure: AMPUTATION RAY 5th;  Surgeon: Gershon Donnice SAUNDERS, DPM;  Location: [facility] OR;  Service: Podiatry;  Laterality: Right;   BONE BIOPSY Right 02/15/2023   Procedure: BONE BIOPSY;  Surgeon: Malvin Marsa FALCON, DPM;  Location: [facility] OR;  Service: Podiatry;  Laterality: Right;   INCISION AND DRAINAGE Right 02/15/2023   Procedure: INCISION AND DRAINAGE OF  RIGHT FOOT WITH FOURTH TOE HEAD RESECTION AND STIMULAN BEADS;  Surgeon: Malvin Marsa FALCON, DPM;  Location: [facility] OR;  Service: Podiatry;  Laterality: Right;   PALATE / UVULA BIOPSY / EXCISION     growth removed   UPPER GASTROINTESTINAL ENDOSCOPY     Done in [place] - 2013 ish   WOUND DEBRIDEMENT Right 05/24/2021   Procedure: RIGHT FOOT WOUND DEBRIDEMENT AND CLOSURE;  Surgeon: Gretel Ozell PARAS, DPM;  Location: [facility] OR;  Service: Podiatry;  Laterality: Right;   Social History   Socioeconomic History   Marital status: Single    Spouse name: Not on file   Number of children: Not on file   Years of education: Not on file   Highest education level: Not on file  Occupational History   Not on file  Tobacco Use   Smoking status: Never    Passive exposure: Yes   Smokeless tobacco: Never  Vaping Use   Vaping status: Never Used  Substance and Sexual Activity   Alcohol use: No   Drug use: No   Sexual activity: Not on file  Other Topics Concern   Not on file  Social History Narrative   Not on file   Social Drivers of Health   Financial Resource Strain: Not on file  Food Insecurity: Not on file  Transportation Needs: Not on file  Physical Activity: Not on file  Stress: Not on file  Social Connections: Not on file  Intimate Partner Violence: Not on file   Current Outpatient Medications on File Prior to Visit  Medication Sig Dispense Refill   Accu-Chek Softclix Lancets lancets USE   TO CHECK GLUCOSE TWICE DAILY 100 each 0   aspirin  EC 81 MG tablet Take 1 tablet (81 mg total) by  mouth daily.     atorvastatin  (LIPITOR) 10 MG tablet Take 1 tablet (10 mg total) by mouth daily. Hold while on Daptomycin  therapy - can resume taking 03/30/23 30 tablet 0   blood glucose meter kit and supplies KIT Dispense based on patient and insurance preference. Use up to four times daily as directed. (FOR ICD-10 E11.65). 1 each 5   Blood Glucose Monitoring Suppl (ACCU-CHEK GUIDE) w/Device KIT Check sugars 2x a day 1 kit 0   busPIRone  (BUSPAR ) 5 MG tablet Take 5 mg by mouth 2 (two) times daily.     DULoxetine  (CYMBALTA ) 30 MG capsule Take 30 mg by mouth daily.     FARXIGA  10 MG TABS tablet Take 1 tablet by mouth once daily 30 tablet 0   glucose blood test strip USE 1 TEST STRIP TO CHECK BLOOD GLUCOSE TWICE DAILY 50 each 0   GVOKE HYPOPEN  1-PACK 1 MG/0.2ML SOAJ Inject 1 mg under skin as needed for hypoglycemia 0.2 mL prn   hydrOXYzine  (ATARAX ) 50 MG tablet Take 50 mg by mouth daily as needed for anxiety, itching, nausea or vomiting.     ibuprofen  (ADVIL ) 800 MG tablet Take 800 mg by mouth 3 (three) times daily as needed for headache, mild pain or moderate pain.     Insulin  Pen Needle 32G X 4 MM MISC Use 2x a day 200 each 3   metoprolol  succinate (TOPROL -XL) 50 MG 24 hr tablet TAKE 1 TABLET BY MOUTH ONCE DAILY AT BEDTIME WITH  OR  IMMEDIATELY  FOLLOWING  A  MEAL 30 tablet 0   nitroGLYCERIN  (NITROSTAT ) 0.4 MG SL tablet Place 1 tablet (0.4 mg total) under the tongue every 5 (five) minutes as needed for chest  pain. 25 tablet 3   Semaglutide , 1 MG/DOSE, (OZEMPIC , 1 MG/DOSE,) 4 MG/3ML SOPN INJECT 1 MG SUBCUTANEOUSLY ONCE A WEEK 9 mL 3   senna-docusate (SENOKOT-S) 8.6-50 MG tablet Take 1 tablet by mouth at bedtime as needed for mild constipation.     TOUJEO  SOLOSTAR 300 UNIT/ML Solostar Pen INJECT 30 UNITS SUBCUTANEOUSLY ONCE DAILY 6 mL 0   zolpidem  (AMBIEN ) 10 MG tablet Take 10 mg by mouth at bedtime.     No current facility-administered medications on file prior to visit.   Allergies  Allergen  Reactions   Amoxicillin Hives    Has patient had a PCN reaction causing immediate rash, facial/tongue/throat swelling, SOB or lightheadedness with hypotension: No Has patient had a PCN reaction causing severe rash involving mucus membranes or skin necrosis: No Has patient had a PCN reaction that required hospitalization: No Has patient had a PCN reaction occurring within the last 10 years: No If all of the above answers are NO, then may proceed with Cephalosporin use.    Lexapro [Escitalopram] Other (See Comments)   Family History  Problem Relation Age of Onset   Healthy Mother    Healthy Father    Healthy Sister    Healthy Sister    Diabetes Maternal Grandmother    Thyroid  disease Maternal Grandmother    Hypertension Maternal Grandmother    Neuropathy Maternal Grandmother    PE: BP 120/68   Pulse 89   Ht 6' 3 (1.905 m)   Wt 192 lb 3.2 oz (87.2 kg)   SpO2 97%   BMI 24.02 kg/m  Wt Readings from Last 3 Encounters:  07/07/24 192 lb 3.2 oz (87.2 kg)  11/04/23 184 lb 12.8 oz (83.8 kg)  06/28/23 186 lb (84.4 kg)   Constitutional: Normal weight, in NAD Eyes:no exophthalmos ENT: no thyromegaly, no cervical lymphadenopathy Cardiovascular: RRR, No MRG Respiratory: CTA B Musculoskeletal: no deformities Skin:  + macular rash bilateral shins and feet Neurological: no tremor with outstretched hands  ASSESSMENT: 1. DM, insulin -dependent, uncontrolled, with complications - DR - Autonomic neuropathy - PN - Microalbuminuria  Component     Latest Ref Rng 07/31/2022  Glucose     65 - 99 mg/dL 67   Glutamic Acid Decarb Ab     <5 IU/mL <5   Islet Cell Ab     Neg:<1:1  Negative   C-Peptide     0.80 - 3.85 ng/mL 0.76 (L)   ZNT8 Antibodies     <15 U/mL <10   Labs confirm type 1 diabetes with low insulin  production.  2.  Weight loss  3.  Hashimoto's thyroiditis  PLAN:  1. Patient with longstanding, uncontrolled, insulin -dependent diabetes, with dramatic weight loss and  diabetes improvement between 2020 and 2023, when sugars started to drop in the 40s and HbA1c returned at 5.6% in the setting of a weight loss of 65 pounds.  PCP stopped Ozempic  and reduced Lantus  dose.  We checked him for insulin  deficiency and a C-peptide was low, while his pancreatic antibodies were not elevated.  We discussed that this confirmed type 1 diabetes. - At last visit, sugars were slightly higher, a little above target in the morning but improving during the day.  We did not change his regimen at that time.  At that time, HbA1c was slightly higher, at 6.6%, but he had another HbA1c on 04/02/2024 and this was higher, at 7.2%.  However, 5 days ago, he had a repeat HbA1c and this was lower, at 5.6%.  He  attributes this to starting working and being more active.  He mentions that working third shift did not make a big difference for him in terms of circadian rhythm, since he was staying up late at night previously. -His sugars appear to be mostly at goal, with only occasional exceptions, including low blood sugar during the night.  Now that he is more active and his HbA1c has improved significantly, we discussed about reducing the Toujeo  dose.  Also, he takes Farxiga  in the morning, before going to bed, and I advised him to move this at the beginning of his day, in the evening. - I suggested to:  Patient Instructions  Please move:  - Farxiga  10 mg when you wake up to go to work  Continue: - Ozempic  1 mg weekly  Reduce: - Toujeo  50 units when you wake up to go to work  Please stop at the lab.  Please return in 6 months with your sugar log.   - advised to check sugars at different times of the day - 1x a day, rotating check times - advised for yearly eye exams >> he is UTD - return to clinic in 6 months  2.  Weight loss -He lost a significant amount of weight: 65 pounds before the last 2 visits (10 pounds before last visit) after starting Ozempic .  He was checked for adrenal  insufficiency by PCP and he had a normal a.m. cortisol and ACTH .  He had a history slightly low TSH levels, but latest TFTs were normal 10/2022 -He had to come off Ozempic , but he gained 40 pounds subsequently so he was started on Trulicity  by PCP.  This was then changed to Ozempic  and he again lost weight-20 pounds.  No nausea or other GI symptoms. -However, weight stabilized afterwards and since last visit, he gained 8 pounds - Will continue the same dose of Ozempic  for now  3.  Hashimoto's thyroiditis - Please also see problem #2 -His TPO antibodies were slightly positive and TRAb antibodies were not elevated -At last visit, his TFTs were normal, however, he had another TSH performed 04/02/2024 and this was low, at 0.12 while the free T4 was normal, at 1.2 (0.8-1.8) - We will repeat his TFTs and Lucas a TSI level today - Our working diagnosis for him is Graves' disease versus thyrotoxic phase of Hashimoto's thyroiditis.    Orders Placed This Encounter  Procedures   TSH   T4, free   T3, free   Thyroid  stimulating immunoglobulin   Lela Fendt, MD PhD [facility]

## 2024-07-08 ENCOUNTER — Ambulatory Visit: Payer: Self-pay | Admitting: Internal Medicine

## 2024-07-09 LAB — THYROID STIMULATING IMMUNOGLOBULIN: TSI: <89 %{baseline} (ref <140)

## 2024-07-09 LAB — T3, FREE: T3, Free: 3.5 pg/mL (ref 2.3–4.2)

## 2024-07-09 LAB — T4, FREE: Free T4: 1.1 ng/dL (ref 0.8–1.8)

## 2024-07-09 LAB — TSH: TSH: 0.51 m[IU]/L (ref 0.40–4.50)

## 2024-08-20 NOTE — Progress Notes (Signed)
 Cardiology Office Note    Date:  08/25/2024  ID:  [name], DOB [birth year], MRN [id number] Cardiologist: Previously Dr. Mona --> Patient prefers to follow-up in [facility] and will switch to Dr. Mallipeddi   History of Present Illness:    [name] is a 28 y.o. male  with past medical history of palpitations (prior monitor in 08/2020 showing no significant arrhythmias), HTN, HLD and IDDM who presents to the office today for annual follow-up.  He was examined by myself in 05/2023 and had been admitted to [facility] earlier in the year for osteomyelitis and was wearing a boot at that time. He denied any recent anginal symptoms and reported that his palpitations had overall improved. Was no longer consuming energy drinks and did not consume alcohol. He was continued on Toprol -XL 50 mg daily. Prior NST had shown mild to moderate peri-infarct ischemia but it was felt this was likely a false positive test.  It was recommended if he developed any recurrent anginal symptoms, would arrange for a Coronary CT.  In talking with the patient today, he reports overall doing well from a cardiac perspective since his last office visit.  He denies any recent chest pain or dyspnea on exertion. 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 Palpitations have overall been well-controlled with no recent symptoms. No specific orthopnea, PND or pitting edema. Says his blood sugar has been variable as he sometimes has a poor appetite since being on Ozempic . Also reports occasional episodes of hypoglycemia with blood sugars as low as the mid-40's at times.  Studies Reviewed:   EKG: EKG is ordered today and demonstrates:   EKG Interpretation Date/Time:  Tuesday August 25 2024 10:01:55 EDT Ventricular Rate:  94 PR Interval:  172 QRS Duration:  86 QT Interval:  342 QTC Calculation: 427 R Axis:   44  Text  Interpretation: Normal sinus rhythm Low voltage QRS Confirmed by Johnson Grate ([id number]) on 08/25/2024 10:06:57 AM       Event Monitor: 08/2020 14 day monitor Min HR 70, Max HR 139, Avg HR 90 No symptoms reported Tracings show sinus rhythm, no significant arrhythmias.  NST: 08/2020 There was no ST segment deviation noted during stress. Findings consistent with prior inferior/inferoseptal/inferoapical myocardial infarction with mild to moderate peri-infarct ischemia. Mild anterior ischemia This is an intermediate risk study. The left ventricular ejection fraction is hyperdynamic (>65%). Abnormal stress test in young patient, of note chart review shows Hgb A1c over the last 3 years 9-13 raising possibility for true onset of early coronary artery disease.  Echocardiogram: 08/2020 IMPRESSIONS     1. There is mild chordal SAM in the setting of hyperdynamic LV with mild  subvalvular gradient of 17 mmHg. No significant LV hypertrophy. . Left  ventricular ejection fraction, by estimation, is 65 to 70%. The left  ventricle has normal function. The left  ventricle has no regional wall motion abnormalities. Left ventricular  diastolic parameters were normal.   2. Right ventricular systolic function is normal. The right ventricular  size is normal.   3. The mitral valve is normal in structure. No evidence of mitral valve  regurgitation. No evidence of mitral stenosis.   4. The aortic valve is tricuspid. Aortic valve regurgitation is not  visualized. No aortic stenosis is present.   5. The inferior vena cava is normal in size with greater than 50%  respiratory  variability, suggesting right atrial pressure of 3 mmHg.    Physical Exam:   VS:  BP 122/74   Pulse 90   Ht 6' 3 (1.905 m)   Wt 194 lb (88 kg)   SpO2 100%   BMI 24.25 kg/m    Wt Readings from Last 3 Encounters:  08/25/24 194 lb (88 kg)  07/07/24 192 lb 3.2 oz (87.2 kg)  11/04/23 184 lb 12.8 oz (83.8 kg)     GEN: Well  nourished, well developed male appearing in no acute distress NECK: No JVD; No carotid bruits CARDIAC: RRR, no murmurs, rubs, gallops RESPIRATORY:  Clear to auscultation without rales, wheezing or rhonchi  ABDOMEN: Appears non-distended. No obvious abdominal masses. EXTREMITIES: No clubbing or cyanosis. No pitting edema.  Distal pedal pulses are 2+ bilaterally.   Assessment and Plan:   1. Palpitations - Prior monitor in 08/2020 showed no significant arrhythmias. He denies any recent palpitations and is in normal sinus rhythm by examination and EKG today. Continue Toprol -XL 50 mg daily.  2. Abnormal nuclear stress test - Prior NST showed mild to moderate peri-infarct ischemia but it was felt this was due to artifact. He denies any recent chest pain or dyspnea on exertion. Continue with risk factor modification. Remains on ASA 81 mg daily, Atorvastatin  10 mg daily and Toprol -XL 50 mg daily.  3. Essential hypertension, benign - BP is well-controlled at 122/74 during today's visit. Continue Toprol -XL 50 mg daily.  4. Mixed hyperlipidemia - LDL was at 29 when checked in 10/2023. Continue current medical therapy with Atorvastatin  10 mg daily.  5. IDDM - He is on Farxiga , Ozepmic and Toujeo  which is managed by Endocrinology. Reports his Hgb A1c had improved to 5.7 when checked most recently. He only checks his blood sugar a few times a week due to calluses. He is interested in a CGM if covered by his insurance. Will send a note to Endocrinology to see if they can help with this as he does not have follow-up until 12/2024.  Signed, Laymon CHRISTELLA Qua, PA-C

## 2024-08-25 ENCOUNTER — Encounter: Payer: Self-pay | Admitting: Student

## 2024-08-25 ENCOUNTER — Encounter: Payer: Self-pay | Admitting: Internal Medicine

## 2024-08-25 ENCOUNTER — Other Ambulatory Visit (HOSPITAL_COMMUNITY): Payer: Self-pay

## 2024-08-25 ENCOUNTER — Other Ambulatory Visit: Payer: Self-pay | Admitting: Internal Medicine

## 2024-08-25 ENCOUNTER — Telehealth: Payer: Self-pay

## 2024-08-25 ENCOUNTER — Ambulatory Visit: Attending: Student | Admitting: Student

## 2024-08-25 VITALS — BP 122/74 | HR 90 | Ht 75.0 in | Wt 194.0 lb

## 2024-08-25 DIAGNOSIS — Z794 Long term (current) use of insulin: Secondary | ICD-10-CM | POA: Insufficient documentation

## 2024-08-25 DIAGNOSIS — E0865 Diabetes mellitus due to underlying condition with hyperglycemia: Secondary | ICD-10-CM | POA: Insufficient documentation

## 2024-08-25 DIAGNOSIS — R9439 Abnormal result of other cardiovascular function study: Secondary | ICD-10-CM | POA: Diagnosis not present

## 2024-08-25 DIAGNOSIS — R002 Palpitations: Secondary | ICD-10-CM | POA: Insufficient documentation

## 2024-08-25 DIAGNOSIS — E782 Mixed hyperlipidemia: Secondary | ICD-10-CM | POA: Diagnosis not present

## 2024-08-25 DIAGNOSIS — I10 Essential (primary) hypertension: Secondary | ICD-10-CM | POA: Diagnosis present

## 2024-08-25 MED ORDER — FREESTYLE LIBRE 3 PLUS SENSOR MISC: 1.0000 | 3 refills | Status: AC

## 2024-08-25 MED ORDER — METOPROLOL SUCCINATE ER 50 MG PO TB24: 50.0000 mg | ORAL_TABLET | Freq: Every day | ORAL | 3 refills | Status: AC

## 2024-08-25 MED ORDER — ATORVASTATIN CALCIUM 10 MG PO TABS: 10.0000 mg | ORAL_TABLET | Freq: Every day | ORAL | 3 refills | Status: AC

## 2024-08-25 NOTE — Patient Instructions (Signed)
 Medication Instructions:  Your physician recommends that you continue on your current medications as directed. Please refer to the Current Medication list given to you today.  *If you need a refill on your cardiac medications before your next appointment, please call your pharmacy*  Lab Work: NONE   If you have labs (blood work) drawn today and your tests are completely normal, you will receive your results only by: MyChart Message (if you have MyChart) OR A paper copy in the mail If you have any lab test that is abnormal or we need to change your treatment, we will call you to review the results.  Testing/Procedures: NONE   Follow-Up: At [facility], you and your health needs are our priority.  As part of our continuing mission to provide you with exceptional heart care, our providers are all part of one team.  This team includes your primary Cardiologist (physician) and Advanced Practice Providers or APPs (Physician Assistants and Nurse Practitioners) who all work together to provide you with the care you need, when you need it.  Your next appointment:   1 year(s)  Provider:   Vishnu Mallipeddi, MD or Laymon Qua, PA-C    We recommend signing up for the patient portal called MyChart.  Sign up information is provided on this After Visit Summary.  MyChart is used to connect with patients for Virtual Visits (Telemedicine).  Patients are able to view lab/test results, encounter notes, upcoming appointments, etc.  Non-urgent messages can be sent to your provider as well.   To learn more about what you can do with MyChart, go to [email or web address].   Other Instructions Thank you for choosing [facility]!

## 2024-08-25 NOTE — Telephone Encounter (Signed)
 Pharmacy Patient Advocate Encounter   Received notification from CoverMyMeds that prior authorization for Freestyle libre 3 plus sensor is required/requested.   Insurance verification completed.   The patient is insured through [employer] .   Per test claim: PA required; PA submitted to above mentioned insurance via Latent Key/confirmation #/EOC [id number] Status is pending

## 2024-09-03 NOTE — Telephone Encounter (Signed)
 Pharmacy Patient Advocate Encounter  Received notification from [employer] that Prior Authorization for Freestyle libre 3 plus sensor has been APPROVED from 08/25/24 to 02/21/25

## 2024-11-20 ENCOUNTER — Other Ambulatory Visit: Payer: Self-pay | Admitting: Internal Medicine

## 2024-12-30 ENCOUNTER — Ambulatory Visit (INDEPENDENT_AMBULATORY_CARE_PROVIDER_SITE_OTHER): Admitting: Podiatry

## 2024-12-30 DIAGNOSIS — Z91199 Patient's noncompliance with other medical treatment and regimen due to unspecified reason: Secondary | ICD-10-CM

## 2024-12-30 NOTE — Progress Notes (Signed)
 Cancel 24 hours

## 2025-01-04 ENCOUNTER — Encounter: Payer: Self-pay | Admitting: Internal Medicine

## 2025-01-04 ENCOUNTER — Ambulatory Visit: Admitting: Internal Medicine

## 2025-01-04 VITALS — BP 124/70 | HR 97 | Ht 75.0 in | Wt 187.4 lb

## 2025-01-04 DIAGNOSIS — E1165 Type 2 diabetes mellitus with hyperglycemia: Secondary | ICD-10-CM

## 2025-01-04 DIAGNOSIS — E063 Autoimmune thyroiditis: Secondary | ICD-10-CM

## 2025-01-04 DIAGNOSIS — Z7985 Long-term (current) use of injectable non-insulin antidiabetic drugs: Secondary | ICD-10-CM

## 2025-01-04 DIAGNOSIS — Z794 Long term (current) use of insulin: Secondary | ICD-10-CM | POA: Diagnosis not present

## 2025-01-04 DIAGNOSIS — Z7984 Long term (current) use of oral hypoglycemic drugs: Secondary | ICD-10-CM | POA: Diagnosis not present

## 2025-01-04 DIAGNOSIS — E1142 Type 2 diabetes mellitus with diabetic polyneuropathy: Secondary | ICD-10-CM | POA: Diagnosis not present

## 2025-01-04 DIAGNOSIS — R634 Abnormal weight loss: Secondary | ICD-10-CM

## 2025-01-04 LAB — POCT GLYCOSYLATED HEMOGLOBIN (HGB A1C): Hemoglobin A1C: 6.3 % — AB (ref 4.0–5.6)

## 2025-01-04 MED ORDER — TOUJEO SOLOSTAR 300 UNIT/ML ~~LOC~~ SOPN: 34.0000 [IU] | PEN_INJECTOR | Freq: Every day | SUBCUTANEOUS | Status: AC

## 2025-01-04 NOTE — Progress Notes (Signed)
 Patient ID: [name], male   DOB: May 10, 1996, 29 y.o.   MRN: [id number]   HPI: [name] is a 29 y.o.-year-old male, initially referred by his PCP, Dr. Janace returning for follow-up for DM, dx at 29 years old, insulin -dependent since 29 y/o, uncontrolled, with complications (autonomic neuropathy, PN, microalbuminuria, DR).  She saw Dr. Lenis in the past. Last visit 6 months ago. [employer] + [employer].  Interim history: No increased urination, nausea, chest pain.   He is working 3rd shift at [employer] in 04/2024 -very active.  He mentions he is walking 16,000 steps a night.  Reviewed HbA1c: 10/09/2024: HbA1c 6.3% 07/02/2024: HbA1c 5.6% 04/02/2024: HbA1c 7.2% Lab Results  Component Value Date   HGBA1C 6.6 (A) 11/04/2023   HGBA1C 6.2 06/28/2023   HGBA1C 6.0 (H) 02/15/2023   HGBA1C 8.4 (A) 11/14/2022   HGBA1C 5.3 07/31/2022   HGBA1C 6.9 (A) 03/27/2022   HGBA1C 10.5 (H) 05/20/2021   HGBA1C 11.4 (A) 10/18/2020   HGBA1C 9.7 (H) 03/17/2019   HGBA1C 8.6 (H) 11/18/2018  10/25/2022: HbA1c 7.4% 01/10/2022: HbA1c 6.5% 08/2020: HbA1c 12.1% 04/2020: HbA1c 11.5%  Previously on: - Metformin ER 500 mg 2x a day, with meals - Victoza 1.2 mg daily in am - started by PCP on 09/28/2020 - no nausea - Lantus  77 units at bedtime >> split 35 units 2x a day on 09/28/2020 He was on Humalog  7-7-9 units before meals  - stopped when started Victoza Metformin IR >> nausea.  I recommended the following regimen: - Farxiga  10 before b'fast  -started 10/2021 - Ozempic  1 mg weekly - Lantus  35 units 2x a day >> .SABRA.10 units 2x a day >> 24-26 >> 15 units 2x a day >> Toujeo  30 units (taking it 2x a day!) >> now 50 units daily He was previously on metformin but developed nausea and stomach pain. He was previously on Humalog  but this was stopped 10/2021 due to low blood sugars. Previously on Victoza, but this was stopped when switching to Ozempic .  Pt checks his sugars >4x a day:  Prev.: - am: 130-155,  190 (12-4 pm) >> 141, 159 >> (evening): 130, 238 - 2h after b'fast: n/c >> 182 >> n/c  - before lunch: 145 >> 80-120 >> 85-120 >> 67, 95-140 - 2h after lunch: n/c >> 231-253 >> n/c >> 187 >> n/c - before dinner: 40s-80s >> n/c >> 110-134, 174 >> n/c - 2h after dinner: n/c >> 359, 371 >> n/c - bedtime: 80-120 >> 60-170, 195 >> 117 (am): 72 - nighttime: n/c >> 127 >> 83 Lowest sugar was 40s x1 >> .SABRA. 67 >> 52; ? at which level he has hypoglycemia awareness. Highest sugar was  371 >> .SABRA.187 >> 238 (missed Ozempic ) >> 200s.  Glucometer: Accu-Chek >> ReliOn   Pt's meals are: - Breakfast: if eats b'fast: cornflakes + milk - Lunch: half a sandwich - Dinner: meat + veggies + starch - home cooked - Snacks: not usually Stopped sweet tea.  No regular sodas.  Living with  - no CKD but he has a history of microalbuminuria, last BUN/creatinine:  04/02/2024: 12/0.74, GFR 128, glucose 87 Lab Results  Component Value Date   BUN 18 11/04/2023   BUN 11 02/18/2023   CREATININE 0.91 11/04/2023   CREATININE 0.78 02/18/2023   ACR: No results found for: MICRALBCREAT 04/02/2024: ACR 7 10/25/2022: 15 07/07/2022: ACR 13 10/19/2015: ACR 78.8 07/28/2012: ACR 41.8 Not on ACE inhibitor/ARB.  -+ HL; last set of lipids: 04/02/2024:  90/39/49/34 10/25/2022: 123/168/60/38 07/07/2021: 106/152/33/50 10/19/2015: 202/110/63/117 Lab Results  Component Value Date   CHOL 90 11/04/2023   HDL 39.70 11/04/2023   LDLCALC 29 11/04/2023   TRIG 105.0 11/04/2023   CHOLHDL 2 11/04/2023  On Lipitor 10.  - last eye exam was in 10-10/2024. + DR and macular edema reportedly. No IO injections.  - + numbness and tingling in his feet.  He has peripheral neuropathy for which she sees podiatry and neurology.  Last foot exam was 06/29/2024: Dr. Sikora. He had EMG/NCV: Message from True Mar, MD sent at 03/13/2022  5:12 PM EDT ----- Please call patient is him that his recent electrical nerve and muscle test through our office  confirms the suspicion of nerve damage, likely from his underlying diabetes.  As discussed, I would recommend ongoing strict diabetes control.  For now, I recommend he follow-up with his endocrinologist (diabetes specialist) and primary care physician closely. At the end of 2023, he had a left foot ulcer (saw podiatry). He developed acute osteomyelitis of right foot and he had I&D + had resection of distal half of the 4th toe and placement of Stimulan beads in 01/2023.  This helped healing.  Prev. On Reglan . On ASA 81.  Pt has FH of DM in MGM, MGGM, PGF - prediabetes.  He also has a history of HTN, cardiac murmur.  He had tachycardia and palpitations.  He was started on carvedilol  after an ED visit in 2019.  He continued to have shortness of breath and exertional fatigue. Stress test, 2D Echo and heart monitor >> normal in 2021.    He was  found to have slightly low TSH levels: Lab Results  Component Value Date   TSH 0.51 07/07/2024   TSH 0.61 11/04/2023   TSH 1.54 11/14/2022   TSH 0.31 (L) 07/31/2022   TSH 0.317 (L) 02/08/2022   TSH 0.277 (L) 06/07/2021   TSH 0.62 03/17/2019   TSH 1.02 11/18/2018   TSH 0.517 08/26/2018   TSH 0.41 08/14/2018   TSH 0.658 08/10/2018  04/02/2024: TSH 0.12, free T4 1.2 (0.8-1.8)  Lab Results  Component Value Date   TSI <89 07/07/2024   TSI <89 07/31/2022      2022-04-11    THYROGLOBULIN ANTIBODIES <1   < or = 1  THYROID  PEROXIDASE ANTIBODIES 11   <9  TRAb (TSH Receptor Binding Antibody)   2022-04-11    TRAB <1.00   <=2.00  TSH+FREE T4   2022-04-11    TSH 0.31   0.40-4.50  T4, FREE 1.0   0.8-1.8   Further labs reviewed per  records from PCP: ACTH , PLASMA   2022-07-09    ACTH , PLASMA 19   6-50  CORTISOL, A.M.   2022-07-09    CORTISOL, A.M. 10.3      He was previously unemployed - disability.  ROS: + see HPI  Past Medical History:  Diagnosis Date   Complication of anesthesia    Hard to wake up when he was younger once.   Diabetes  mellitus, type II (HCC)    Heart murmur    Hypertension    Past Surgical History:  Procedure Laterality Date   AMPUTATION Right 05/21/2021   Procedure: AMPUTATION RAY 5th;  Surgeon: Gershon Donnice SAUNDERS, DPM;  Location: [facility] OR;  Service: Podiatry;  Laterality: Right;   BONE BIOPSY Right 02/15/2023   Procedure: BONE BIOPSY;  Surgeon: Malvin Marsa FALCON, DPM;  Location: [facility] OR;  Service: Podiatry;  Laterality: Right;   INCISION AND  DRAINAGE Right 02/15/2023   Procedure: INCISION AND DRAINAGE OF RIGHT FOOT WITH FOURTH TOE HEAD RESECTION AND STIMULAN BEADS;  Surgeon: Malvin Marsa FALCON, DPM;  Location: [facility] OR;  Service: Podiatry;  Laterality: Right;   PALATE / UVULA BIOPSY / EXCISION     growth removed   UPPER GASTROINTESTINAL ENDOSCOPY     Done in [place] - 2013 ish   WOUND DEBRIDEMENT Right 05/24/2021   Procedure: RIGHT FOOT WOUND DEBRIDEMENT AND CLOSURE;  Surgeon: Gretel Ozell PARAS, DPM;  Location: [facility] OR;  Service: Podiatry;  Laterality: Right;   Social History   Socioeconomic History   Marital status: Single    Spouse name: Not on file   Number of children: Not on file   Years of education: Not on file   Highest education level: Not on file  Occupational History   Not on file  Tobacco Use   Smoking status: Never    Passive exposure: Yes   Smokeless tobacco: Never  Vaping Use   Vaping status: Never Used  Substance and Sexual Activity   Alcohol use: No   Drug use: No   Sexual activity: Not on file  Other Topics Concern   Not on file  Social History Narrative   Not on file   Social Drivers of Health   Tobacco Use: Medium Risk (08/25/2024)   Patient History    Smoking Tobacco Use: Never    Smokeless Tobacco Use: Never    Passive Exposure: Yes  Financial Resource Strain: Not on file  Food Insecurity: Not on file  Transportation Needs: Not on file  Physical Activity: Not on file  Stress: Not on file  Social Connections: Not on file  Intimate Partner Violence: Not  on file  Depression (PHQ2-9): Low Risk (03/07/2023)   Depression (PHQ2-9)    PHQ-2 Score: 0  Alcohol Screen: Not on file  Housing: Not on file  Utilities: Not on file  Health Literacy: Not on file   Current Outpatient Medications on File Prior to Visit  Medication Sig Dispense Refill   Accu-Chek Softclix Lancets lancets USE   TO CHECK GLUCOSE TWICE DAILY 100 each 0   aspirin  EC 81 MG tablet Take 1 tablet (81 mg total) by mouth daily.     atorvastatin  (LIPITOR) 10 MG tablet Take 1 tablet (10 mg total) by mouth daily. 90 tablet 3   blood glucose meter kit and supplies KIT Dispense based on patient and insurance preference. Use up to four times daily as directed. (FOR ICD-10 E11.65). 1 each 5   Blood Glucose Monitoring Suppl (ACCU-CHEK GUIDE) w/Device KIT Check sugars 2x a day 1 kit 0   busPIRone  (BUSPAR ) 5 MG tablet Take 5 mg by mouth 2 (two) times daily.     Continuous Glucose Sensor (FREESTYLE LIBRE 3 PLUS SENSOR) MISC 1 each by Does not apply route every 14 (fourteen) days. 6 each 3   DULoxetine  (CYMBALTA ) 30 MG capsule Take 30 mg by mouth daily.     FARXIGA  10 MG TABS tablet Take 1 tablet (10 mg total) by mouth daily. 90 tablet 3   glucose blood test strip USE 1 TEST STRIP TO CHECK BLOOD GLUCOSE TWICE DAILY 50 each 0   GVOKE HYPOPEN  1-PACK 1 MG/0.2ML SOAJ Inject 1 mg under skin as needed for hypoglycemia 0.2 mL prn   hydrOXYzine  (ATARAX ) 50 MG tablet Take 50 mg by mouth daily as needed for anxiety, itching, nausea or vomiting.     ibuprofen  (ADVIL ) 800 MG tablet  Take 800 mg by mouth 3 (three) times daily as needed for headache, mild pain or moderate pain.     insulin  glargine, 1 Unit Dial, (TOUJEO  SOLOSTAR) 300 UNIT/ML Solostar Pen INJECT 50 UNITS SUBCUTANEOUSLY ONCE DAILY 6 mL 5   Insulin  Pen Needle 32G X 4 MM MISC Use 2x a day 200 each 3   metoprolol  succinate (TOPROL -XL) 50 MG 24 hr tablet Take 1 tablet (50 mg total) by mouth daily. Take with or immediately following a meal. 90 tablet 3    nitroGLYCERIN  (NITROSTAT ) 0.4 MG SL tablet Place 1 tablet (0.4 mg total) under the tongue every 5 (five) minutes as needed for chest pain. 25 tablet 3   Semaglutide , 1 MG/DOSE, (OZEMPIC , 1 MG/DOSE,) 4 MG/3ML SOPN INJECT 1 MG SUBCUTANEOUSLY ONCE A WEEK 9 mL 3   senna-docusate (SENOKOT-S) 8.6-50 MG tablet Take 1 tablet by mouth at bedtime as needed for mild constipation.     zolpidem  (AMBIEN ) 10 MG tablet Take 10 mg by mouth at bedtime.     No current facility-administered medications on file prior to visit.   Allergies  Allergen Reactions   Amoxicillin Hives    Has patient had a PCN reaction causing immediate rash, facial/tongue/throat swelling, SOB or lightheadedness with hypotension: No Has patient had a PCN reaction causing severe rash involving mucus membranes or skin necrosis: No Has patient had a PCN reaction that required hospitalization: No Has patient had a PCN reaction occurring within the last 10 years: No If all of the above answers are NO, then may proceed with Cephalosporin use.    Lexapro [Escitalopram] Other (See Comments)   Family History  Problem Relation Age of Onset   Healthy Mother    Healthy Father    Healthy Sister    Healthy Sister    Diabetes Maternal Grandmother    Thyroid  disease Maternal Grandmother    Hypertension Maternal Grandmother    Neuropathy Maternal Grandmother    PE: BP 124/70   Pulse 97   Ht 6' 3 (1.905 m)   Wt 187 lb 6.4 oz (85 kg)   SpO2 99%   BMI 23.42 kg/m  Wt Readings from Last 3 Encounters:  01/04/25 187 lb 6.4 oz (85 kg)  08/25/24 194 lb (88 kg)  07/07/24 192 lb 3.2 oz (87.2 kg)   Constitutional: Normal weight, in NAD Eyes:no exophthalmos ENT: no thyromegaly, no cervical lymphadenopathy Cardiovascular: tachycardia, RR, No MRG Respiratory: CTA B Musculoskeletal: no deformities Neurological: no tremor with outstretched hands  ASSESSMENT: 1. DM, insulin -dependent, uncontrolled, with complications - DR - Autonomic  neuropathy - PN - Microalbuminuria  Component     Latest Ref Rng 07/31/2022  Glucose     65 - 99 mg/dL 67   Glutamic Acid Decarb Ab     <5 IU/mL <5   Islet Cell Ab     Neg:<1:1  Negative   C-Peptide     0.80 - 3.85 ng/mL 0.76 (L)   ZNT8 Antibodies     <15 U/mL <10   Labs confirm type 1 diabetes with low insulin  production.  2.  Weight loss  3.  Hashimoto's thyroiditis  PLAN:  1. Patient with longstanding, uncontrolled, insulin -dependent diabetes, with dramatic weight loss and diabetes improvement between 2020 and 2023, when sugars started to drop in the 40s and HbA1c returned at 5.6% in the setting of a weight loss of 65 pounds.  PCP stopped Ozempic  at that time and reduced his Lantus  dose.  We checked him for insulin  deficiency  and a C-peptide was low, while his antipancreatic antibodies were not elevated.  We discussed that this confirms type 1 diabetes.  He was subsequently able to start back on Ozempic  without significant weight loss at our last visit. - Previously, sugars were slightly higher, a little above target in the morning but improving later in the day.  An HbA1c was 5.6%, improved.  He attributed this to starting working and being more active.  He was working third shift.  Sugars appears to be mostly at goal, with only occasional exceptions, including low blood sugars during the night.  We discussed about reducing the Toujeo  dose.  Also, he was taking Farxiga  in the morning, before going to bed and I advised him to move this in the beginning of his day, in the evening. - Since last visit, 3 months ago he had another HbA1c which was 6.3%, higher. - Since last visit, he was able to start a CGM CGM interpretation: -At today's visit, we reviewed his CGM downloads: It appears that 71% of values are in target range (goal >70%), while 6% are higher than 180 (goal <25%), and 23% are lower than 70 (goal <4%).  The calculated average blood sugar is 107.  The projected HbA1c for the next  3 months (GMI) is 5.9%. -Reviewing the CGM trends, sugars appear to be fluctuating low in the normal range, but dropping frequently under 70s, particularly in the second half of the night and first half of the day.  Upon questioning, he is very active during the night, mostly on his feet and he is not eating enough to keep up with the calorie loss.  He is trying to improve his diet.  He lost 6 pounds since last visit.  He did not try to reduce his insulin  dose so we will do this now.  I advised him to decrease the dose of his Toujeo  from 50 units to 34 units and to let me know if he has any more lows.  Will also back off the dose of Ozempic  due to the weight loss.  Will continue the same dose of Farxiga  dose for now. - I suggested to:  Patient Instructions  Please continue: - Farxiga  10 mg when you wake up to go to work  Reduce: - Toujeo  34 units when you wake up to go to work (reduce even more if needed) - Ozempic  0.5 mg weekly  On the Ozempic  1 mg pen: - 18 clicks  - 0.25 mg - 36 clicks - 0.5 mg - 54 clicks - 0.75 mg - 72 clicks - 1 mg  Please return in 4 months.  - we checked his HbA1c: 6.3% (stable) - advised to check sugars at different times of the day - 4x a day, rotating check times - advised for yearly eye exams >> he is UTD - return to clinic in 4 months  2.  Weight loss -He lost a significant amount of weight: 65 pounds after starting Ozempic .  He was checked for adrenal insufficiency by PCP and he had a normal a.m. cortisol and ACTH .  He had a history slightly low TSH levels, but latest TFTs were normal  -He had to come off Ozempic , but he gained 40 pounds subsequently so he was started on Trulicity  by PCP.  This was then changed to Ozempic  and he again lost weight-20 pounds.  No nausea or other GI symptoms.  However, weight stabilized afterwards and before our last visit, he gained 8 pounds.  Since  then, he started to work at [employer] and he is on his feet all night and he lost  5 pounds since last visit - Will will reduce the dose of Ozempic  from 1 mg to 0.5 mg weekly.  We did discuss that if the weight stabilizes and his sugars increase, he could increase the dose to 0.75 mg weekly  3.  Hashimoto's thyroiditis -Our working diagnosis for him is Graves' disease versus thyrotoxic phase of Hashimoto's thyroiditis -His TPO antibodies were slightly positive and TRAb antibodies were not elevated.  We also checked TSI antibodies at last visit and these were undetectable. - He had intermittently low TSH previously, but at last visit, in 06/2024, all TFTs were normal - Will repeat these at next visit  Lela Fendt, MD PhD [facility] Endocrinology

## 2025-01-04 NOTE — Addendum Note (Signed)
 Addended by: CLEOTILDE ROLIN RAMAN on: 01/04/2025 01:10 PM   Modules accepted: Orders

## 2025-01-04 NOTE — Patient Instructions (Addendum)
 Please continue: - Farxiga  10 mg when you wake up to go to work  Reduce: - Toujeo  34 units when you wake up to go to work (reduce even more if needed) - Ozempic  0.5 mg weekly  On the Ozempic  1 mg pen: - 18 clicks  - 0.25 mg - 36 clicks - 0.5 mg - 54 clicks - 0.75 mg - 72 clicks - 1 mg  Please return in 4 months.

## 2025-02-03 ENCOUNTER — Ambulatory Visit: Admitting: Podiatry

## 2025-05-03 ENCOUNTER — Ambulatory Visit: Admitting: Internal Medicine
